# Patient Record
Sex: Female | Born: 1940 | Race: White | Hispanic: No | Marital: Single | Smoking: Never smoker
Health system: Southern US, Community
[De-identification: ages and names within clinical notes are randomized; demographics above are authoritative.]

## PROBLEM LIST (undated history)

## (undated) DIAGNOSIS — I10 Essential (primary) hypertension: Secondary | ICD-10-CM

## (undated) DIAGNOSIS — H9190 Unspecified hearing loss, unspecified ear: Secondary | ICD-10-CM

## (undated) DIAGNOSIS — J449 Chronic obstructive pulmonary disease, unspecified: Secondary | ICD-10-CM

## (undated) DIAGNOSIS — M810 Age-related osteoporosis without current pathological fracture: Secondary | ICD-10-CM

---

## 2018-11-14 ENCOUNTER — Other Ambulatory Visit: Payer: Self-pay | Admitting: Specialist

## 2018-11-14 DIAGNOSIS — J849 Interstitial pulmonary disease, unspecified: Secondary | ICD-10-CM

## 2018-11-21 ENCOUNTER — Ambulatory Visit
Admission: RE | Admit: 2018-11-21 | Discharge: 2018-11-21 | Disposition: A | Discharge status: Home / Self Care | Payer: Medicare Other | Source: Ambulatory Visit | Attending: Specialist | Admitting: Specialist

## 2018-11-21 ENCOUNTER — Other Ambulatory Visit: Payer: Self-pay

## 2018-11-21 DIAGNOSIS — J849 Interstitial pulmonary disease, unspecified: Secondary | ICD-10-CM | POA: Diagnosis present

## 2018-11-29 DIAGNOSIS — I10 Essential (primary) hypertension: Secondary | ICD-10-CM | POA: Insufficient documentation

## 2018-12-11 ENCOUNTER — Telehealth: Payer: Self-pay

## 2018-12-11 NOTE — Telephone Encounter (Signed)
Spoke with facility and pt is being seen by Dr. Raul Del, referral was sent to [place] in error. Nothing further needed.

## 2018-12-11 NOTE — Telephone Encounter (Signed)
Called facility x2 and left vm to schedule consult appt. Received faxed referral from [employer].

## 2019-01-17 ENCOUNTER — Emergency Department: Payer: Medicare Other

## 2019-01-17 ENCOUNTER — Other Ambulatory Visit: Payer: Self-pay

## 2019-01-17 ENCOUNTER — Encounter: Payer: Self-pay | Admitting: Emergency Medicine

## 2019-01-17 ENCOUNTER — Emergency Department
Admission: EM | Admit: 2019-01-17 | Discharge: 2019-01-17 | Disposition: A | Discharge status: Home / Self Care | Payer: Medicare Other | Attending: Student in an Organized Health Care Education/Training Program | Admitting: Student in an Organized Health Care Education/Training Program

## 2019-01-17 DIAGNOSIS — W19XXXA Unspecified fall, initial encounter: Secondary | ICD-10-CM | POA: Diagnosis not present

## 2019-01-17 DIAGNOSIS — Y92129 Unspecified place in nursing home as the place of occurrence of the external cause: Secondary | ICD-10-CM | POA: Diagnosis not present

## 2019-01-17 DIAGNOSIS — Y939 Activity, unspecified: Secondary | ICD-10-CM | POA: Diagnosis not present

## 2019-01-17 DIAGNOSIS — I10 Essential (primary) hypertension: Secondary | ICD-10-CM | POA: Insufficient documentation

## 2019-01-17 DIAGNOSIS — S0083XA Contusion of other part of head, initial encounter: Secondary | ICD-10-CM | POA: Insufficient documentation

## 2019-01-17 DIAGNOSIS — S0990XA Unspecified injury of head, initial encounter: Secondary | ICD-10-CM

## 2019-01-17 DIAGNOSIS — J449 Chronic obstructive pulmonary disease, unspecified: Secondary | ICD-10-CM | POA: Insufficient documentation

## 2019-01-17 DIAGNOSIS — Z79899 Other long term (current) drug therapy: Secondary | ICD-10-CM | POA: Diagnosis not present

## 2019-01-17 DIAGNOSIS — Y999 Unspecified external cause status: Secondary | ICD-10-CM | POA: Diagnosis not present

## 2019-01-17 HISTORY — DX: Essential (primary) hypertension: I10

## 2019-01-17 HISTORY — DX: Age-related osteoporosis without current pathological fracture: M81.0

## 2019-01-17 HISTORY — DX: Chronic obstructive pulmonary disease, unspecified: J44.9

## 2019-01-17 LAB — CBC
HCT: 32.3 % — ABNORMAL LOW (ref 36.0–46.0)
Hemoglobin: 10 g/dL — ABNORMAL LOW (ref 12.0–15.0)
MCH: 28.3 pg (ref 26.0–34.0)
MCHC: 31 g/dL (ref 30.0–36.0)
MCV: 91.5 fL (ref 80.0–100.0)
Platelets: 190 10*3/uL (ref 150–400)
RBC: 3.53 MIL/uL — ABNORMAL LOW (ref 3.87–5.11)
RDW: 14.6 % (ref 11.5–15.5)
WBC: 7 10*3/uL (ref 4.0–10.5)
nRBC: 0 % (ref 0.0–0.2)

## 2019-01-17 LAB — BASIC METABOLIC PANEL
Anion gap: 9 (ref 5–15)
BUN: 6 mg/dL — ABNORMAL LOW (ref 8–23)
CO2: 25 mmol/L (ref 22–32)
Calcium: 8.3 mg/dL — ABNORMAL LOW (ref 8.9–10.3)
Chloride: 104 mmol/L (ref 98–111)
Creatinine, Ser: 0.68 mg/dL (ref 0.44–1.00)
GFR calc Af Amer: >60 mL/min (ref >60)
GFR calc non Af Amer: >60 mL/min (ref >60)
Glucose, Bld: 110 mg/dL — ABNORMAL HIGH (ref 70–99)
Potassium: 3.7 mmol/L (ref 3.5–5.1)
Sodium: 138 mmol/L (ref 135–145)

## 2019-01-17 MED ORDER — SODIUM CHLORIDE 0.9% FLUSH
3.0000 mL | Freq: Once | INTRAVENOUS | Status: AC
  Administered 2019-01-17: 12:00:00 3 mL via INTRAVENOUS

## 2019-01-17 NOTE — ED Notes (Signed)
Per the [employer] pt does not know sign language bu communicates by reading lips. Pt does not have family to call for assistance. Pt has been able to read noted with this RN but will not write messages back.

## 2019-01-17 NOTE — ED Notes (Signed)
[facility] to this RN that they are willing to pick up patient. Pt agrees with discharge. NAD at this time.

## 2019-01-17 NOTE — ED Notes (Signed)
Patient transported to CT 

## 2019-01-17 NOTE — ED Notes (Addendum)
Pt ambulatory to bathroom with short distance for ambulation. Pt had notable increased WOB and attempting multiple times to clear throat. Oxygen was noted to be off patient. Oxygen reapplied and Pt sitting on edge of bed at this time to cath breath. When sitting on edge of bed with oxygen on pts oxygen saturation increased above 93%.

## 2019-01-17 NOTE — ED Provider Notes (Signed)
[facility] Emergency Department Provider Note    First MD Initiated Contact with Patient 01/17/19 1244     (approximate)  I have reviewed the triage vital signs and the nursing notes.   HISTORY  Chief Complaint Fall    HPI Alice Wallace is a 78 y.o. female blows a past medical history presents after witnessed fall with brief LOC.  Coming from nursing facility.  Wears 3 L nasal cannula chronically.  Denies any pain at this time.  States that she has chronic shortness of breath.  Denies any nausea or vomiting.  Does have mild contusion to right forehead and some neck pain.    Past Medical History:  Diagnosis Date  . COPD (chronic obstructive pulmonary disease) ([place])   . Hypertension   . Osteoporosis    No family history on file.  There are no active problems to display for this patient.     Prior to Admission medications   Medication Sig Start Date End Date Taking? Authorizing Provider  diclofenac sodium (VOLTAREN) 1 % GEL Apply 2 g topically 3 (three) times daily. 01/03/19  Yes [provider]  diltiazem (CARDIZEM CD) 240 MG 24 hr capsule Take 240 mg by mouth daily. 01/11/19  Yes [provider]  escitalopram (LEXAPRO) 10 MG tablet Take 10 mg by mouth at bedtime. 06/08/16  Yes [provider]  ferrous sulfate 325 (65 FE) MG tablet Take 325 mg by mouth daily.   Yes [provider]  furosemide (LASIX) 20 MG tablet Take 20 mg by mouth daily.   Yes [provider]  omeprazole (PRILOSEC) 40 MG capsule Take 40 mg by mouth daily. 01/11/19  Yes [provider]  potassium chloride (K-DUR) 10 MEQ tablet Take 10 mEq by mouth daily.   Yes [provider]  traMADol (ULTRAM) 50 MG tablet Take 50 mg by mouth every 8 (eight) hours. 01/02/19  Yes [provider]    Allergies Hydrocodone, Naproxen, Other, Sulfamethoxazole-trimethoprim, and Tramadol    Social History Social History   Tobacco  Use  . Smoking status: Not on file  Substance Use Topics  . Alcohol use: Not on file  . Drug use: Not on file    Review of Systems Patient denies headaches, rhinorrhea, blurry vision, numbness, shortness of breath, chest pain, edema, cough, abdominal pain, nausea, vomiting, diarrhea, dysuria, fevers, rashes or hallucinations unless otherwise stated above in HPI. ____________________________________________   PHYSICAL EXAM:  VITAL SIGNS: Vitals:   01/17/19 1322 01/17/19 1323  BP:    Pulse: 88 85  Resp: 20 (!) 22  Temp:    SpO2: 95% 96%    Constitutional: Alert and oriented. Non verbal bu will communicate yes or no after reading written messages Eyes: Conjunctivae are normal.  Head: Atraumatic. Nose: No congestion/rhinnorhea. Mouth/Throat: Mucous membranes are moist.   Neck: No stridor. Painless ROM.  Cardiovascular: Normal rate, regular rhythm. Grossly normal heart sounds.  Good peripheral circulation. Respiratory: Normal respiratory effort.  No retractions. Lungs CTAB. Gastrointestinal: Soft and nontender. No distention. No abdominal bruits. No CVA tenderness. Genitourinary:  Musculoskeletal: No lower extremity tenderness nor edema.  No joint effusions. Neurologic:  Normal speech and language. No gross focal neurologic deficits are appreciated. No facial droop Skin:  Skin is warm, dry and intact. No rash noted. Psychiatric: Mood and affect are normal. Speech and behavior are normal.  ____________________________________________   LABS (all labs ordered are listed, but only abnormal results are displayed)  Results for orders placed  or performed during the hospital encounter of 01/17/19 (from the past 24 hour(s))  Basic metabolic panel     Status: Abnormal   Collection Time: 01/17/19 11:36 AM  Result Value Ref Range   Sodium 138 135 - 145 mmol/L   Potassium 3.7 3.5 - 5.1 mmol/L   Chloride 104 98 - 111 mmol/L   CO2 25 22 - 32 mmol/L   Glucose, Bld 110 (H) 70 - 99  mg/dL   BUN 6 (L) 8 - 23 mg/dL   Creatinine, Ser 1.610.68 0.44 - 1.00 mg/dL   Calcium 8.3 (L) 8.9 - 10.3 mg/dL   GFR calc non Af Amer >60 >60 mL/min   GFR calc Af Amer >60 >60 mL/min   Anion gap 9 5 - 15  CBC     Status: Abnormal   Collection Time: 01/17/19 11:36 AM  Result Value Ref Range   WBC 7.0 4.0 - 10.5 K/uL   RBC 3.53 (L) 3.87 - 5.11 MIL/uL   Hemoglobin 10.0 (L) 12.0 - 15.0 g/dL   HCT 09.632.3 (L) 04.536.0 - 40.946.0 %   MCV 91.5 80.0 - 100.0 fL   MCH 28.3 26.0 - 34.0 pg   MCHC 31.0 30.0 - 36.0 g/dL   RDW 81.114.6 91.411.5 - 78.215.5 %   Platelets 190 150 - 400 K/uL   nRBC 0.0 0.0 - 0.2 %   ____________________________________________  EKG My review and personal interpretation at Time: 11:31   Indication: fall  Rate: 90  Rhythm: sinus Axis: normal Other: normal intervals, no stemi ____________________________________________  RADIOLOGY  I personally reviewed all radiographic images ordered to evaluate for the above acute complaints and reviewed radiology reports and findings.  These findings were personally discussed with the patient.  Please see medical record for radiology report.   ____________________________________________   PROCEDURES  Procedure(s) performed:  Procedures    Critical Care performed: no ____________________________________________   INITIAL IMPRESSION / ASSESSMENT AND PLAN / ED COURSE  Pertinent labs & imaging results that were available during my care of the patient were reviewed by me and considered in my medical decision making (see chart for details).   DDX: Contusion, subdural, epidural, IPH, concussion, fracture, COPD, pneumonia  Alice Wallace is a 78 y.o. who presents to the ED with symptoms as described above.  Mechanical fall with minor head injury and contusion to right forehead without laceration.  CT imaging and radiographs are reassuring.  Blood work is reassuring.  She denies any additional pain.  No evidence of laceration.  She feels at her  baseline and comfortable discharge back to facility.       The patient was evaluated in Emergency Department today for the symptoms described in the history of present illness. He/she was evaluated in the context of the global COVID-19 pandemic, which necessitated consideration that the patient might be at risk for infection with the SARS-CoV-2 virus that causes COVID-19. Institutional protocols and algorithms that pertain to the evaluation of patients at risk for COVID-19 are in a state of rapid change based on information released by regulatory bodies including the CDC and federal and state organizations. These policies and algorithms were followed during the patient's care in the ED.  As part of my medical decision making, I reviewed the following data within the electronic MEDICAL RECORD NUMBER Nursing notes reviewed and incorporated, Labs reviewed, notes from prior ED visits and [place] Controlled Substance Database   ____________________________________________   FINAL CLINICAL IMPRESSION(S) / ED DIAGNOSES  Final diagnoses:  Fall,  initial encounter  Minor head injury, initial encounter      NEW MEDICATIONS STARTED DURING THIS VISIT:  New Prescriptions   No medications on file     Note:  This document was prepared using Dragon voice recognition software and may include unintentional dictation errors.    Willy Eddy, MD 01/17/19 1343

## 2019-01-17 NOTE — ED Notes (Addendum)
Patient transported to CT 

## 2019-01-17 NOTE — ED Triage Notes (Signed)
Pt to ED via [facility] from [place] for fall. Pt tripped on her bedroom shoes and fell. Pt reports that she did have LOC after the fall. Pt does not take blood thinners. Pt is deaf and mute. Pt is on home O2 at 3 liters via [place]. Pt is in NAD at this time. Pt has hematoma on right forehead, and right arm. Pt is having right shoulder pain and head pain. VSS per EMS. CBG 89.

## 2019-05-26 ENCOUNTER — Emergency Department: Payer: Medicare Other

## 2019-05-26 ENCOUNTER — Encounter: Payer: Self-pay | Admitting: *Deleted

## 2019-05-26 ENCOUNTER — Inpatient Hospital Stay
Admission: EM | Admit: 2019-05-26 | Discharge: 2019-05-27 | DRG: 871 | Disposition: A | Discharge status: Other Acute Inpatient Hospital | Payer: Medicare Other | Source: Skilled Nursing Facility | Attending: Internal Medicine | Admitting: Internal Medicine

## 2019-05-26 ENCOUNTER — Other Ambulatory Visit: Payer: Self-pay

## 2019-05-26 DIAGNOSIS — H913 Deaf nonspeaking, not elsewhere classified: Secondary | ICD-10-CM | POA: Diagnosis present

## 2019-05-26 DIAGNOSIS — E860 Dehydration: Secondary | ICD-10-CM | POA: Diagnosis present

## 2019-05-26 DIAGNOSIS — U071 COVID-19: Secondary | ICD-10-CM

## 2019-05-26 DIAGNOSIS — R0603 Acute respiratory distress: Secondary | ICD-10-CM

## 2019-05-26 DIAGNOSIS — E876 Hypokalemia: Secondary | ICD-10-CM | POA: Diagnosis present

## 2019-05-26 DIAGNOSIS — E878 Other disorders of electrolyte and fluid balance, not elsewhere classified: Secondary | ICD-10-CM | POA: Diagnosis present

## 2019-05-26 DIAGNOSIS — Z885 Allergy status to narcotic agent status: Secondary | ICD-10-CM

## 2019-05-26 DIAGNOSIS — Z7982 Long term (current) use of aspirin: Secondary | ICD-10-CM

## 2019-05-26 DIAGNOSIS — Z7983 Long term (current) use of bisphosphonates: Secondary | ICD-10-CM

## 2019-05-26 DIAGNOSIS — I10 Essential (primary) hypertension: Secondary | ICD-10-CM | POA: Diagnosis present

## 2019-05-26 DIAGNOSIS — J9601 Acute respiratory failure with hypoxia: Secondary | ICD-10-CM | POA: Diagnosis not present

## 2019-05-26 DIAGNOSIS — N390 Urinary tract infection, site not specified: Secondary | ICD-10-CM

## 2019-05-26 DIAGNOSIS — N179 Acute kidney failure, unspecified: Secondary | ICD-10-CM | POA: Diagnosis present

## 2019-05-26 DIAGNOSIS — A4189 Other specified sepsis: Principal | ICD-10-CM | POA: Diagnosis present

## 2019-05-26 DIAGNOSIS — R195 Other fecal abnormalities: Secondary | ICD-10-CM | POA: Diagnosis present

## 2019-05-26 DIAGNOSIS — Z888 Allergy status to other drugs, medicaments and biological substances status: Secondary | ICD-10-CM

## 2019-05-26 DIAGNOSIS — Z79899 Other long term (current) drug therapy: Secondary | ICD-10-CM

## 2019-05-26 DIAGNOSIS — I444 Left anterior fascicular block: Secondary | ICD-10-CM | POA: Diagnosis present

## 2019-05-26 DIAGNOSIS — D649 Anemia, unspecified: Secondary | ICD-10-CM | POA: Diagnosis present

## 2019-05-26 DIAGNOSIS — J440 Chronic obstructive pulmonary disease with acute lower respiratory infection: Secondary | ICD-10-CM | POA: Diagnosis present

## 2019-05-26 DIAGNOSIS — R0602 Shortness of breath: Secondary | ICD-10-CM

## 2019-05-26 DIAGNOSIS — J1282 Pneumonia due to coronavirus disease 2019: Secondary | ICD-10-CM | POA: Diagnosis present

## 2019-05-26 DIAGNOSIS — R0902 Hypoxemia: Secondary | ICD-10-CM

## 2019-05-26 DIAGNOSIS — E871 Hypo-osmolality and hyponatremia: Secondary | ICD-10-CM | POA: Diagnosis present

## 2019-05-26 DIAGNOSIS — M810 Age-related osteoporosis without current pathological fracture: Secondary | ICD-10-CM | POA: Diagnosis present

## 2019-05-26 HISTORY — DX: Unspecified hearing loss, unspecified ear: H91.90

## 2019-05-26 MED ORDER — DEXAMETHASONE SODIUM PHOSPHATE 10 MG/ML IJ SOLN
6.0000 mg | INTRAMUSCULAR | Status: DC
  Administered 2019-05-27: 01:00:00 6 mg via INTRAVENOUS
  Filled 2019-05-26 (×2): qty 1

## 2019-05-26 MED ORDER — SODIUM CHLORIDE 0.9% FLUSH
3.0000 mL | Freq: Two times a day (BID) | INTRAVENOUS | Status: DC
  Administered 2019-05-27: 02:00:00 3 mL via INTRAVENOUS

## 2019-05-26 MED ORDER — SODIUM CHLORIDE 0.9 % IV SOLN
200.0000 mg | Freq: Once | INTRAVENOUS | Status: AC
  Administered 2019-05-27: 01:00:00 200 mg via INTRAVENOUS
  Filled 2019-05-26: qty 200

## 2019-05-26 MED ORDER — SODIUM CHLORIDE 0.9 % IV BOLUS
1000.0000 mL | Freq: Once | INTRAVENOUS | Status: AC
  Administered 2019-05-27: 01:00:00 1000 mL via INTRAVENOUS

## 2019-05-26 MED ORDER — SODIUM CHLORIDE 0.9 % IV SOLN: 100.0000 mg | Freq: Every day | INTRAVENOUS | Status: DC

## 2019-05-26 NOTE — ED Provider Notes (Signed)
[facility] Emergency Department Provider Note   ____________________________________________   First MD Initiated Contact with Patient 05/26/19 2340     (approximate)  I have reviewed the triage vital signs and the nursing notes.   HISTORY  Chief Complaint Shortness of Breath  Level V caveat: Limited by respiratory distress Patient is deaf and does not use ASL.  Majority of history obtained from EMS via nursing home staff.  HPI Alice Wallace is a 79 y.o. female brought to the ED via EMS from the [place] with a chief complaint of respiratory distress.  Staff told EMS patient tested positive for COVID-19 on 12/29.  Patient has COPD and wears 3 L continuous oxygenation at baseline.  EMS reported sats on her usual 3 L in the low 80%'s.  Arrives on nonrebreather.  Denies abdominal pain, nausea, vomiting or diarrhea.       Past Medical History:  Diagnosis Date  . COPD (chronic obstructive pulmonary disease) ([place])   . Hypertension   . Osteoporosis     Patient Active Problem List   Diagnosis Date Noted  . Acute hypoxemic respiratory failure due to COVID-19 [facility]) 05/27/2019     Prior to Admission medications   Medication Sig Start Date End Date Taking? Authorizing Provider  acetaminophen (TYLENOL) 500 MG tablet Take 500 mg by mouth 3 (three) times daily.    [provider]  alendronate (FOSAMAX) 70 MG tablet Take 70 mg by mouth once a week. Take with a full glass of water on an empty stomach.    [provider]  aspirin EC 81 MG tablet Take 81 mg by mouth daily.    [provider]  cetirizine (ZYRTEC) 10 MG tablet Take 10 mg by mouth daily.    [provider]  Cholecalciferol (VITAMIN D) 125 MCG (5000 UT) CAPS Take 5,000 Units by mouth daily.    [provider]  diclofenac sodium (VOLTAREN) 1 % GEL Apply 2 g topically 3 (three) times daily. 01/03/19   [provider]  diltiazem (CARDIZEM CD) 240 MG 24 hr  capsule Take 240 mg by mouth daily. 01/11/19   [provider]  escitalopram (LEXAPRO) 10 MG tablet Take 10 mg by mouth at bedtime. 06/08/16   [provider]  ferrous sulfate 325 (65 FE) MG tablet Take 325 mg by mouth daily.    [provider]  Fluticasone-Salmeterol (ADVAIR) 100-50 MCG/DOSE AEPB Inhale 1 puff into the lungs 2 (two) times daily.    [provider]  furosemide (LASIX) 20 MG tablet Take 20 mg by mouth daily.    [provider]  omeprazole (PRILOSEC) 40 MG capsule Take 40 mg by mouth daily. 01/11/19   [provider]  potassium chloride (K-DUR) 10 MEQ tablet Take 10 mEq by mouth daily.    [provider]  traMADol (ULTRAM) 50 MG tablet Take 50 mg by mouth every 8 (eight) hours. 01/02/19   [provider]    Allergies Hydrocodone, Naproxen, Other, Sulfamethoxazole-trimethoprim, and Tramadol  No family history on file.  Social History Social History   Tobacco Use  . Smoking status: Never Smoker  . Smokeless tobacco: Never Used  Substance Use Topics  . Alcohol use: Not Currently  . Drug use: Not Currently    Review of Systems  Constitutional: No fever/chills Eyes: No visual changes. ENT: No sore throat. Cardiovascular: Denies chest pain. Respiratory: Positive for shortness of breath. Gastrointestinal: No abdominal pain.  No nausea, no vomiting.  No  diarrhea.  No constipation. Genitourinary: Negative for dysuria. Musculoskeletal: Negative for back pain. Skin: Negative for rash. Neurological: Negative for headaches, focal weakness or numbness.   ____________________________________________   PHYSICAL EXAM:  VITAL SIGNS: ED Triage Vitals  Enc Vitals Group     BP      Pulse      Resp      Temp      Temp src      SpO2      Weight      Height      Head Circumference      Peak Flow      Pain Score      Pain Loc      Pain Edu?      Excl. in GC?     Constitutional: Alert and  oriented.  Elderly appearing and in moderate acute distress. Eyes: Conjunctivae are normal. PERRL. EOMI. Head: Atraumatic. Nose: No congestion/rhinnorhea. Mouth/Throat: Mucous membranes are moist.  Oropharynx non-erythematous. Neck: No stridor.   Cardiovascular: Normal rate, regular rhythm. Grossly normal heart sounds.  Good peripheral circulation. Respiratory: Increased respiratory effort.  No retractions. Lungs diminished bibasilarly. Gastrointestinal: Soft and nontender. No distention. No abdominal bruits. No CVA tenderness. Musculoskeletal: No lower extremity tenderness nor edema.  No joint effusions. Neurologic: Alert and oriented x3. No gross focal neurologic deficits are appreciated.  Skin:  Skin is warm, dry and intact. No rash noted. Psychiatric: Mood and affect are normal. Speech and behavior are normal.  ____________________________________________   LABS (all labs ordered are listed, but only abnormal results are displayed)  Labs Reviewed  CBC WITH DIFFERENTIAL/PLATELET - Abnormal; Notable for the following components:      Result Value   RBC 3.52 (*)    Hemoglobin 10.4 (*)    HCT 31.0 (*)    Neutro Abs 9.5 (*)    Lymphs Abs 0.4 (*)    All other components within normal limits  COMPREHENSIVE METABOLIC PANEL - Abnormal; Notable for the following components:   Sodium 133 (*)    Potassium 3.2 (*)    Chloride 89 (*)    Glucose, Bld 125 (*)    Creatinine, Ser 1.25 (*)    GFR calc non Af Amer 41 (*)    GFR calc Af Amer 48 (*)    All other components within normal limits  FERRITIN - Abnormal; Notable for the following components:   Ferritin 833 (*)    All other components within normal limits  URINALYSIS, COMPLETE (UACMP) WITH MICROSCOPIC - Abnormal; Notable for the following components:   Color, Urine YELLOW (*)    APPearance CLOUDY (*)    Ketones, ur 5 (*)    Leukocytes,Ua LARGE (*)    WBC, UA >50 (*)    Bacteria, UA RARE (*)    All other components within normal  limits  POC SARS CORONAVIRUS 2 AG - Abnormal; Notable for the following components:   SARS Coronavirus 2 Ag POSITIVE (*)    All other components within normal limits  TROPONIN I (HIGH SENSITIVITY) - Abnormal; Notable for the following components:   Troponin I (High Sensitivity) 19 (*)    All other components within normal limits  TROPONIN I (HIGH SENSITIVITY) - Abnormal; Notable for the following components:   Troponin I (High Sensitivity) 20 (*)    All other components within normal limits  CULTURE, BLOOD (ROUTINE X 2)  CULTURE, BLOOD (ROUTINE X 2)  URINE CULTURE  EXPECTORATED SPUTUM ASSESSMENT W REFEX TO RESP CULTURE  FIBRINOGEN  LACTATE DEHYDROGENASE  PROCALCITONIN  LACTIC ACID, PLASMA  LACTIC ACID, PLASMA  C-REACTIVE PROTEIN  LEGIONELLA PNEUMOPHILA SEROGP 1 UR AG  STREP PNEUMONIAE URINARY ANTIGEN  INFLUENZA PANEL BY PCR (TYPE A & B)  D-DIMER, QUANTITATIVE (NOT AT [facility])  POC SARS CORONAVIRUS 2 AG -  ED  ABO/RH  ABO/RH   ____________________________________________  EKG  ED ECG REPORT I, Romon Devereux J, the attending physician, personally viewed and interpreted this ECG.   Date: 05/26/2019  EKG Time: 0015  Rate: 91  Rhythm: normal EKG, normal sinus rhythm  Axis: LAD  Intervals:left anterior fascicular block  ST&T Change: Nonspecific  ____________________________________________  RADIOLOGY  ED MD interpretation: COPD  Official radiology report(s): DG Chest Port 1 View  Result Date: 05/27/2019 CLINICAL DATA:  79 year old female with respiratory distress. EXAM: PORTABLE CHEST 1 VIEW COMPARISON:  Chest radiograph dated 01/17/2019 and CT dated 11/21/2018. FINDINGS: Emphysema. Bilateral mid to lower lung field interstitial coarsening as well as hazy airspace density primarily involving the left lung base, likely chronic. Developing infiltrate is less likely. Clinical correlation is recommended. No pleural effusion or pneumothorax. Right upper lobe calcified nodules again  noted. Stable cardiac silhouette. Atherosclerotic calcification of the aorta. No acute osseous pathology. IMPRESSION: 1. Left lower lung field density, likely chronic. Developing infiltrate is less likely but not excluded. Clinical correlation is recommended. 2. Advanced emphysema. Electronically Signed   By: Elgie Collard M.D.   On: 05/27/2019 00:41    ____________________________________________   PROCEDURES  Procedure(s) performed (including Critical Care):  Procedures  CRITICAL CARE Performed by: Irean Hong   Total critical care time: 45 minutes  Critical care time was exclusive of separately billable procedures and treating other patients.  Critical care was necessary to treat or prevent imminent or life-threatening deterioration.  Critical care was time spent personally by me on the following activities: development of treatment plan with patient and/or surrogate as well as nursing, discussions with consultants, evaluation of patient's response to treatment, examination of patient, obtaining history from patient or surrogate, ordering and performing treatments and interventions, ordering and review of laboratory studies, ordering and review of radiographic studies, pulse oximetry and re-evaluation of patient's condition.  ____________________________________________   INITIAL IMPRESSION / ASSESSMENT AND PLAN / ED COURSE  As part of my medical decision making, I reviewed the following data within the electronic MEDICAL RECORD NUMBER Nursing notes reviewed and incorporated, Interpreter needed, Labs reviewed, EKG interpreted, Old chart reviewed, Radiograph reviewed, Discussed with admitting physician and Notes from prior ED visits     Alice Wallace was evaluated in Emergency Department on 05/27/2019 for the symptoms described in the history of present illness. She was evaluated in the context of the global COVID-19 pandemic, which necessitated consideration that the patient might be  at risk for infection with the SARS-CoV-2 virus that causes COVID-19. Institutional protocols and algorithms that pertain to the evaluation of patients at risk for COVID-19 are in a state of rapid change based on information released by regulatory bodies including the CDC and federal and state organizations. These policies and algorithms were followed during the patient's care in the ED.    79 year old female with COPD on 3 L baseline oxygen who is Covid positive per facility report presenting with respiratory distress. Differential includes, but is not limited to, viral syndrome, bronchitis including COPD exacerbation, pneumonia, reactive airway disease including asthma, CHF including exacerbation with or without pulmonary/interstitial edema, pneumothorax, ACS, thoracic trauma, and pulmonary embolism.  Obtain Covid/sepsis work-up.  Rapid Covid  antigen ordered as there is no documented positive Covid result in the computer due to patient being tested at the nursing facility.  IV fluids for blood pressure.  Start IV Decadron and remdesivir.  Placed on high flow nasal cannula oxygen.  Anticipate hospitalization.   Clinical Course as of May 26 318  Tue May 27, 2019  0057 Sats 98% on high flow nasal cannula oxygen.  Will administer IV antibiotics for UTI.  Will discuss with hospitalist services to evaluate patient in the emergency department for admission.   [JS]    Clinical Course User Index [JS] Irean Hong, MD     ____________________________________________   FINAL CLINICAL IMPRESSION(S) / ED DIAGNOSES  Final diagnoses:  COVID-19  Shortness of breath  Hypoxia  Respiratory distress  Urinary tract infection without hematuria, site unspecified     ED Discharge Orders    None       Note:  This document was prepared using Dragon voice recognition software and may include unintentional dictation errors.   Irean Hong, MD 05/27/19 [id number]

## 2019-05-26 NOTE — ED Triage Notes (Signed)
Pt brought in via ems from the [facility] with sob   Pt is covid positive.  Pt is deaf.  oxygens sats  77% rooom air, 92 % on 6 liters [place].  Pt alert  nsr on monitor

## 2019-05-26 NOTE — ED Notes (Signed)
Pt brought in for sob.  Pt is deaf.  Pt on 6 liters oxygen [place].  Pt alert.  md at bedside.

## 2019-05-27 ENCOUNTER — Emergency Department: Payer: Medicare Other

## 2019-05-27 ENCOUNTER — Encounter: Payer: Self-pay | Admitting: Family Medicine

## 2019-05-27 ENCOUNTER — Inpatient Hospital Stay (HOSPITAL_COMMUNITY)
Admission: AD | Admit: 2019-05-27 | Discharge: 2019-05-30 | DRG: 177 | Disposition: A | Discharge status: Skilled Nursing Facility | Payer: Medicare Other | Source: Other Acute Inpatient Hospital | Attending: Internal Medicine | Admitting: Internal Medicine

## 2019-05-27 DIAGNOSIS — R778 Other specified abnormalities of plasma proteins: Secondary | ICD-10-CM | POA: Diagnosis present

## 2019-05-27 DIAGNOSIS — L89220 Pressure ulcer of left hip, unstageable: Secondary | ICD-10-CM | POA: Diagnosis not present

## 2019-05-27 DIAGNOSIS — R0902 Hypoxemia: Secondary | ICD-10-CM | POA: Diagnosis not present

## 2019-05-27 DIAGNOSIS — K921 Melena: Secondary | ICD-10-CM | POA: Diagnosis present

## 2019-05-27 DIAGNOSIS — Z885 Allergy status to narcotic agent status: Secondary | ICD-10-CM | POA: Diagnosis not present

## 2019-05-27 DIAGNOSIS — J159 Unspecified bacterial pneumonia: Secondary | ICD-10-CM | POA: Diagnosis present

## 2019-05-27 DIAGNOSIS — H913 Deaf nonspeaking, not elsewhere classified: Secondary | ICD-10-CM | POA: Diagnosis present

## 2019-05-27 DIAGNOSIS — Z7983 Long term (current) use of bisphosphonates: Secondary | ICD-10-CM | POA: Diagnosis not present

## 2019-05-27 DIAGNOSIS — E860 Dehydration: Secondary | ICD-10-CM | POA: Diagnosis present

## 2019-05-27 DIAGNOSIS — J1282 Pneumonia due to coronavirus disease 2019: Secondary | ICD-10-CM | POA: Diagnosis present

## 2019-05-27 DIAGNOSIS — Z7982 Long term (current) use of aspirin: Secondary | ICD-10-CM

## 2019-05-27 DIAGNOSIS — J439 Emphysema, unspecified: Secondary | ICD-10-CM | POA: Diagnosis not present

## 2019-05-27 DIAGNOSIS — E871 Hypo-osmolality and hyponatremia: Secondary | ICD-10-CM | POA: Diagnosis present

## 2019-05-27 DIAGNOSIS — M810 Age-related osteoporosis without current pathological fracture: Secondary | ICD-10-CM | POA: Diagnosis present

## 2019-05-27 DIAGNOSIS — B9620 Unspecified Escherichia coli [E. coli] as the cause of diseases classified elsewhere: Secondary | ICD-10-CM | POA: Diagnosis not present

## 2019-05-27 DIAGNOSIS — J9601 Acute respiratory failure with hypoxia: Secondary | ICD-10-CM | POA: Diagnosis present

## 2019-05-27 DIAGNOSIS — E876 Hypokalemia: Secondary | ICD-10-CM | POA: Diagnosis present

## 2019-05-27 DIAGNOSIS — Z888 Allergy status to other drugs, medicaments and biological substances status: Secondary | ICD-10-CM | POA: Diagnosis not present

## 2019-05-27 DIAGNOSIS — N390 Urinary tract infection, site not specified: Secondary | ICD-10-CM

## 2019-05-27 DIAGNOSIS — U071 COVID-19: Principal | ICD-10-CM | POA: Diagnosis present

## 2019-05-27 DIAGNOSIS — A4189 Other specified sepsis: Secondary | ICD-10-CM | POA: Diagnosis not present

## 2019-05-27 DIAGNOSIS — I10 Essential (primary) hypertension: Secondary | ICD-10-CM | POA: Diagnosis present

## 2019-05-27 DIAGNOSIS — N179 Acute kidney failure, unspecified: Secondary | ICD-10-CM | POA: Diagnosis present

## 2019-05-27 DIAGNOSIS — Z79899 Other long term (current) drug therapy: Secondary | ICD-10-CM | POA: Diagnosis not present

## 2019-05-27 DIAGNOSIS — Z7951 Long term (current) use of inhaled steroids: Secondary | ICD-10-CM

## 2019-05-27 DIAGNOSIS — E878 Other disorders of electrolyte and fluid balance, not elsewhere classified: Secondary | ICD-10-CM | POA: Diagnosis present

## 2019-05-27 DIAGNOSIS — J9621 Acute and chronic respiratory failure with hypoxia: Secondary | ICD-10-CM | POA: Diagnosis present

## 2019-05-27 DIAGNOSIS — D649 Anemia, unspecified: Secondary | ICD-10-CM | POA: Diagnosis present

## 2019-05-27 DIAGNOSIS — I444 Left anterior fascicular block: Secondary | ICD-10-CM | POA: Diagnosis present

## 2019-05-27 DIAGNOSIS — J440 Chronic obstructive pulmonary disease with acute lower respiratory infection: Secondary | ICD-10-CM | POA: Diagnosis present

## 2019-05-27 DIAGNOSIS — R195 Other fecal abnormalities: Secondary | ICD-10-CM | POA: Diagnosis present

## 2019-05-27 LAB — CBC WITH DIFFERENTIAL/PLATELET
Abs Immature Granulocytes: 0.04 10*3/uL (ref 0.00–0.07)
Basophils Absolute: 0 10*3/uL (ref 0.0–0.1)
Basophils Relative: 0 %
Eosinophils Absolute: 0 10*3/uL (ref 0.0–0.5)
Eosinophils Relative: 0 %
HCT: 31 % — ABNORMAL LOW (ref 36.0–46.0)
Hemoglobin: 10.4 g/dL — ABNORMAL LOW (ref 12.0–15.0)
Immature Granulocytes: 0 %
Lymphocytes Relative: 4 %
Lymphs Abs: 0.4 10*3/uL — ABNORMAL LOW (ref 0.7–4.0)
MCH: 29.5 pg (ref 26.0–34.0)
MCHC: 33.5 g/dL (ref 30.0–36.0)
MCV: 88.1 fL (ref 80.0–100.0)
Monocytes Absolute: 0.5 10*3/uL (ref 0.1–1.0)
Monocytes Relative: 5 %
Neutro Abs: 9.5 10*3/uL — ABNORMAL HIGH (ref 1.7–7.7)
Neutrophils Relative %: 91 %
Platelets: 190 10*3/uL (ref 150–400)
RBC: 3.52 MIL/uL — ABNORMAL LOW (ref 3.87–5.11)
RDW: 14.4 % (ref 11.5–15.5)
WBC: 10.3 10*3/uL (ref 4.0–10.5)
nRBC: 0 % (ref 0.0–0.2)

## 2019-05-27 LAB — URINALYSIS, COMPLETE (UACMP) WITH MICROSCOPIC
Bilirubin Urine: NEGATIVE
Glucose, UA: NEGATIVE mg/dL
Hgb urine dipstick: NEGATIVE
Ketones, ur: 5 mg/dL — AB
Nitrite: NEGATIVE
Protein, ur: NEGATIVE mg/dL
Specific Gravity, Urine: 1.011 (ref 1.005–1.030)
Squamous Epithelial / HPF: NONE SEEN (ref 0–5)
WBC, UA: >50 WBC/hpf — ABNORMAL HIGH (ref 0–5)
pH: 5 (ref 5.0–8.0)

## 2019-05-27 LAB — COMPREHENSIVE METABOLIC PANEL
ALT: 15 U/L (ref 0–44)
AST: 27 U/L (ref 15–41)
Albumin: 4 g/dL (ref 3.5–5.0)
Alkaline Phosphatase: 54 U/L (ref 38–126)
Anion gap: 14 (ref 5–15)
BUN: 22 mg/dL (ref 8–23)
CO2: 30 mmol/L (ref 22–32)
Calcium: 9 mg/dL (ref 8.9–10.3)
Chloride: 89 mmol/L — ABNORMAL LOW (ref 98–111)
Creatinine, Ser: 1.25 mg/dL — ABNORMAL HIGH (ref 0.44–1.00)
GFR calc Af Amer: 48 mL/min — ABNORMAL LOW (ref >60)
GFR calc non Af Amer: 41 mL/min — ABNORMAL LOW (ref >60)
Glucose, Bld: 125 mg/dL — ABNORMAL HIGH (ref 70–99)
Potassium: 3.2 mmol/L — ABNORMAL LOW (ref 3.5–5.1)
Sodium: 133 mmol/L — ABNORMAL LOW (ref 135–145)
Total Bilirubin: 0.8 mg/dL (ref 0.3–1.2)
Total Protein: 7 g/dL (ref 6.5–8.1)

## 2019-05-27 LAB — TROPONIN I (HIGH SENSITIVITY)
Troponin I (High Sensitivity): 19 ng/L — ABNORMAL HIGH (ref <18)
Troponin I (High Sensitivity): 20 ng/L — ABNORMAL HIGH (ref <18)

## 2019-05-27 LAB — ABO/RH: ABO/RH(D): A POS

## 2019-05-27 LAB — LACTIC ACID, PLASMA
Lactic Acid, Venous: 1.5 mmol/L (ref 0.5–1.9)
Lactic Acid, Venous: 1.8 mmol/L (ref 0.5–1.9)

## 2019-05-27 LAB — TYPE AND SCREEN
ABO/RH(D): A POS
Antibody Screen: NEGATIVE

## 2019-05-27 LAB — FERRITIN: Ferritin: 833 ng/mL — ABNORMAL HIGH (ref 11–307)

## 2019-05-27 LAB — HEMOGLOBIN: Hemoglobin: 10.1 g/dL — ABNORMAL LOW (ref 12.0–15.0)

## 2019-05-27 LAB — D-DIMER, QUANTITATIVE: D-Dimer, Quant: 0.69 ug{FEU}/mL — ABNORMAL HIGH (ref 0.00–0.50)

## 2019-05-27 LAB — POC SARS CORONAVIRUS 2 AG: SARS Coronavirus 2 Ag: POSITIVE — AB

## 2019-05-27 LAB — C-REACTIVE PROTEIN: CRP: 10 mg/dL — ABNORMAL HIGH (ref <1.0)

## 2019-05-27 LAB — PROCALCITONIN: Procalcitonin: 4.34 ng/mL

## 2019-05-27 LAB — INFLUENZA PANEL BY PCR (TYPE A & B)
Influenza A By PCR: NEGATIVE
Influenza B By PCR: NEGATIVE

## 2019-05-27 LAB — LACTATE DEHYDROGENASE: LDH: 177 U/L (ref 98–192)

## 2019-05-27 LAB — FIBRINOGEN: Fibrinogen: 465 mg/dL (ref 210–475)

## 2019-05-27 MED ORDER — LORATADINE 10 MG PO TABS
10.0000 mg | ORAL_TABLET | Freq: Every day | ORAL | Status: DC
  Administered 2019-05-28 – 2019-05-30 (×3): 10 mg via ORAL
  Filled 2019-05-27 (×3): qty 1

## 2019-05-27 MED ORDER — LORATADINE 10 MG PO TABS
10.0000 mg | ORAL_TABLET | Freq: Every day | ORAL | Status: DC
  Administered 2019-05-27: 09:00:00 10 mg via ORAL
  Filled 2019-05-27: qty 1

## 2019-05-27 MED ORDER — ASPIRIN EC 81 MG PO TBEC
81.0000 mg | DELAYED_RELEASE_TABLET | Freq: Every day | ORAL | Status: DC
  Administered 2019-05-28 – 2019-05-30 (×3): 81 mg via ORAL
  Filled 2019-05-27 (×3): qty 1

## 2019-05-27 MED ORDER — METHYLPREDNISOLONE SODIUM SUCC 40 MG IJ SOLR
0.5000 mg/kg | Freq: Two times a day (BID) | INTRAMUSCULAR | Status: DC
  Administered 2019-05-27 – 2019-05-29 (×4): 29.6 mg via INTRAVENOUS
  Filled 2019-05-27 (×4): qty 1

## 2019-05-27 MED ORDER — TRAMADOL HCL 50 MG PO TABS
50.0000 mg | ORAL_TABLET | Freq: Three times a day (TID) | ORAL | Status: DC
  Administered 2019-05-27 (×2): 50 mg via ORAL
  Filled 2019-05-27 (×2): qty 1

## 2019-05-27 MED ORDER — SODIUM CHLORIDE 0.9 % IV SOLN
500.0000 mg | INTRAVENOUS | Status: DC
  Administered 2019-05-27: 500 mg via INTRAVENOUS
  Filled 2019-05-27: qty 500

## 2019-05-27 MED ORDER — SODIUM CHLORIDE 0.9 % IV SOLN
500.0000 mg | INTRAVENOUS | Status: DC
  Administered 2019-05-27 – 2019-05-29 (×3): 500 mg via INTRAVENOUS
  Filled 2019-05-27 (×3): qty 500

## 2019-05-27 MED ORDER — ENOXAPARIN SODIUM 40 MG/0.4ML ~~LOC~~ SOLN: 40.0000 mg | SUBCUTANEOUS | Status: DC

## 2019-05-27 MED ORDER — MAGNESIUM HYDROXIDE 400 MG/5ML PO SUSP: 30.0000 mL | Freq: Every day | ORAL | Status: DC | PRN

## 2019-05-27 MED ORDER — VITAMIN D 25 MCG (1000 UNIT) PO TABS
1000.0000 [IU] | ORAL_TABLET | Freq: Every day | ORAL | Status: DC
  Administered 2019-05-27: 09:00:00 1000 [IU] via ORAL
  Filled 2019-05-27: qty 1

## 2019-05-27 MED ORDER — SODIUM CHLORIDE 0.9 % IV SOLN: 100.0000 mg | Freq: Every day | INTRAVENOUS | Status: DC

## 2019-05-27 MED ORDER — POLYETHYLENE GLYCOL 3350 17 G PO PACK: 17.0000 g | PACK | Freq: Every day | ORAL | Status: DC | PRN

## 2019-05-27 MED ORDER — PANTOPRAZOLE SODIUM 40 MG IV SOLR
40.0000 mg | Freq: Two times a day (BID) | INTRAVENOUS | Status: DC
  Administered 2019-05-27: 12:00:00 40 mg via INTRAVENOUS
  Filled 2019-05-27: qty 40

## 2019-05-27 MED ORDER — ACETAMINOPHEN 500 MG PO TABS
500.0000 mg | ORAL_TABLET | Freq: Three times a day (TID) | ORAL | Status: DC
  Administered 2019-05-27 – 2019-05-30 (×10): 500 mg via ORAL
  Filled 2019-05-27 (×10): qty 1

## 2019-05-27 MED ORDER — ALBUTEROL SULFATE HFA 108 (90 BASE) MCG/ACT IN AERS
2.0000 | INHALATION_SPRAY | RESPIRATORY_TRACT | Status: DC | PRN
  Filled 2019-05-27: qty 6.7

## 2019-05-27 MED ORDER — POTASSIUM CHLORIDE CRYS ER 10 MEQ PO TBCR
10.0000 meq | EXTENDED_RELEASE_TABLET | Freq: Every day | ORAL | Status: DC
  Administered 2019-05-27: 09:00:00 10 meq via ORAL
  Filled 2019-05-27: qty 1

## 2019-05-27 MED ORDER — VITAMIN D 125 MCG (5000 UT) PO CAPS: 5000.0000 [IU] | ORAL_CAPSULE | Freq: Every day | ORAL | Status: DC

## 2019-05-27 MED ORDER — ESCITALOPRAM OXALATE 10 MG PO TABS: 10.0000 mg | ORAL_TABLET | Freq: Every day | ORAL | Status: DC

## 2019-05-27 MED ORDER — ENOXAPARIN SODIUM 40 MG/0.4ML ~~LOC~~ SOLN
40.0000 mg | SUBCUTANEOUS | Status: DC
  Administered 2019-05-27: 08:00:00 40 mg via SUBCUTANEOUS
  Filled 2019-05-27: qty 0.4

## 2019-05-27 MED ORDER — ESCITALOPRAM OXALATE 10 MG PO TABS
10.0000 mg | ORAL_TABLET | Freq: Every day | ORAL | Status: DC
  Administered 2019-05-27 – 2019-05-29 (×3): 10 mg via ORAL
  Filled 2019-05-27 (×3): qty 1

## 2019-05-27 MED ORDER — SODIUM CHLORIDE 0.9 % IV SOLN: 200.0000 mg | Freq: Once | INTRAVENOUS | Status: DC

## 2019-05-27 MED ORDER — FERROUS SULFATE 325 (65 FE) MG PO TABS
325.0000 mg | ORAL_TABLET | Freq: Every day | ORAL | Status: DC
  Administered 2019-05-28 – 2019-05-30 (×3): 325 mg via ORAL
  Filled 2019-05-27 (×4): qty 1

## 2019-05-27 MED ORDER — DEXAMETHASONE SODIUM PHOSPHATE 10 MG/ML IJ SOLN: 6.0000 mg | INTRAMUSCULAR | Status: DC

## 2019-05-27 MED ORDER — ASCORBIC ACID 500 MG PO TABS
500.0000 mg | ORAL_TABLET | Freq: Every day | ORAL | Status: DC
  Administered 2019-05-27: 09:00:00 500 mg via ORAL
  Filled 2019-05-27: qty 1

## 2019-05-27 MED ORDER — DILTIAZEM HCL ER COATED BEADS 120 MG PO CP24
240.0000 mg | ORAL_CAPSULE | Freq: Every day | ORAL | Status: DC
  Administered 2019-05-28 – 2019-05-30 (×3): 240 mg via ORAL
  Filled 2019-05-27 (×3): qty 2

## 2019-05-27 MED ORDER — GUAIFENESIN ER 600 MG PO TB12
600.0000 mg | ORAL_TABLET | Freq: Two times a day (BID) | ORAL | Status: DC
  Administered 2019-05-27 (×2): 600 mg via ORAL
  Filled 2019-05-27 (×2): qty 1

## 2019-05-27 MED ORDER — PANTOPRAZOLE SODIUM 40 MG IV SOLR
40.0000 mg | Freq: Two times a day (BID) | INTRAVENOUS | Status: DC
  Administered 2019-05-27 – 2019-05-30 (×6): 40 mg via INTRAVENOUS
  Filled 2019-05-27 (×6): qty 40

## 2019-05-27 MED ORDER — LACTATED RINGERS IV SOLN: INTRAVENOUS | Status: AC

## 2019-05-27 MED ORDER — DILTIAZEM HCL ER COATED BEADS 240 MG PO CP24
240.0000 mg | ORAL_CAPSULE | Freq: Every day | ORAL | Status: DC
  Administered 2019-05-27: 240 mg via ORAL
  Filled 2019-05-27: qty 1

## 2019-05-27 MED ORDER — SODIUM CHLORIDE 0.9 % IV SOLN: INTRAVENOUS | Status: DC

## 2019-05-27 MED ORDER — POTASSIUM CHLORIDE ER 10 MEQ PO TBCR
10.0000 meq | EXTENDED_RELEASE_TABLET | Freq: Every day | ORAL | Status: DC
  Filled 2019-05-27: qty 1

## 2019-05-27 MED ORDER — FERROUS SULFATE 325 (65 FE) MG PO TABS
325.0000 mg | ORAL_TABLET | Freq: Every day | ORAL | Status: DC
  Administered 2019-05-27: 09:00:00 325 mg via ORAL
  Filled 2019-05-27: qty 1

## 2019-05-27 MED ORDER — VITAMIN D 25 MCG (1000 UNIT) PO TABS
5000.0000 [IU] | ORAL_TABLET | Freq: Every day | ORAL | Status: DC
  Administered 2019-05-27 – 2019-05-30 (×4): 5000 [IU] via ORAL
  Filled 2019-05-27 (×4): qty 5

## 2019-05-27 MED ORDER — HYDROCOD POLST-CPM POLST ER 10-8 MG/5ML PO SUER: 5.0000 mL | Freq: Two times a day (BID) | ORAL | Status: DC | PRN

## 2019-05-27 MED ORDER — GUAIFENESIN-DM 100-10 MG/5ML PO SYRP
10.0000 mL | ORAL_SOLUTION | ORAL | Status: DC | PRN
  Filled 2019-05-27: qty 10

## 2019-05-27 MED ORDER — ASPIRIN EC 81 MG PO TBEC
81.0000 mg | DELAYED_RELEASE_TABLET | Freq: Every day | ORAL | Status: DC
  Administered 2019-05-27: 09:00:00 81 mg via ORAL
  Filled 2019-05-27: qty 1

## 2019-05-27 MED ORDER — FAMOTIDINE 20 MG PO TABS
20.0000 mg | ORAL_TABLET | Freq: Two times a day (BID) | ORAL | Status: DC
  Administered 2019-05-27 (×2): 20 mg via ORAL
  Filled 2019-05-27 (×2): qty 1

## 2019-05-27 MED ORDER — ALENDRONATE SODIUM 70 MG PO TABS: 70.0000 mg | ORAL_TABLET | ORAL | Status: DC

## 2019-05-27 MED ORDER — FUROSEMIDE 20 MG PO TABS
20.0000 mg | ORAL_TABLET | Freq: Every day | ORAL | Status: DC
  Administered 2019-05-27: 20 mg via ORAL
  Filled 2019-05-27: qty 1

## 2019-05-27 MED ORDER — FUROSEMIDE 10 MG/ML IJ SOLN
60.0000 mg | Freq: Once | INTRAMUSCULAR | Status: AC
  Administered 2019-05-27: 09:00:00 60 mg via INTRAVENOUS
  Filled 2019-05-27 (×2): qty 8

## 2019-05-27 MED ORDER — ZINC SULFATE 220 (50 ZN) MG PO CAPS
220.0000 mg | ORAL_CAPSULE | Freq: Every day | ORAL | Status: DC
  Administered 2019-05-27: 09:00:00 220 mg via ORAL
  Filled 2019-05-27: qty 1

## 2019-05-27 MED ORDER — SODIUM CHLORIDE 0.9 % IV SOLN
100.0000 mg | Freq: Every day | INTRAVENOUS | Status: AC
  Administered 2019-05-27 – 2019-05-30 (×4): 100 mg via INTRAVENOUS
  Filled 2019-05-27 (×4): qty 20

## 2019-05-27 MED ORDER — ONDANSETRON HCL 4 MG/2ML IJ SOLN
4.0000 mg | Freq: Four times a day (QID) | INTRAMUSCULAR | Status: DC | PRN
  Administered 2019-05-27: 10:00:00 4 mg via INTRAVENOUS
  Filled 2019-05-27: qty 2

## 2019-05-27 MED ORDER — ONDANSETRON HCL 4 MG PO TABS: 4.0000 mg | ORAL_TABLET | Freq: Four times a day (QID) | ORAL | Status: DC | PRN

## 2019-05-27 MED ORDER — PANTOPRAZOLE SODIUM 40 MG PO TBEC: 40.0000 mg | DELAYED_RELEASE_TABLET | Freq: Every day | ORAL | Status: DC

## 2019-05-27 MED ORDER — SODIUM CHLORIDE 0.9 % IV SOLN
2.0000 g | INTRAVENOUS | Status: DC
  Administered 2019-05-27 – 2019-05-29 (×3): 2 g via INTRAVENOUS
  Filled 2019-05-27 (×3): qty 20

## 2019-05-27 MED ORDER — SODIUM CHLORIDE 0.9 % IV SOLN: 2.0000 g | INTRAVENOUS | Status: DC

## 2019-05-27 MED ORDER — SODIUM CHLORIDE 0.9 % IV SOLN
1.0000 g | Freq: Once | INTRAVENOUS | Status: AC
  Administered 2019-05-27: 02:00:00 1 g via INTRAVENOUS
  Filled 2019-05-27: qty 10

## 2019-05-27 MED ORDER — PANTOPRAZOLE SODIUM 40 MG PO TBEC
40.0000 mg | DELAYED_RELEASE_TABLET | Freq: Every day | ORAL | Status: DC
  Administered 2019-05-27: 09:00:00 40 mg via ORAL
  Filled 2019-05-27: qty 1

## 2019-05-27 MED ORDER — MOMETASONE FURO-FORMOTEROL FUM 100-5 MCG/ACT IN AERO
2.0000 | INHALATION_SPRAY | Freq: Two times a day (BID) | RESPIRATORY_TRACT | Status: DC
  Administered 2019-05-27 – 2019-05-30 (×6): 2 via RESPIRATORY_TRACT
  Filled 2019-05-27: qty 8.8

## 2019-05-27 MED ORDER — TRAMADOL HCL 50 MG PO TABS
50.0000 mg | ORAL_TABLET | Freq: Three times a day (TID) | ORAL | Status: DC
  Administered 2019-05-27 – 2019-05-30 (×8): 50 mg via ORAL
  Filled 2019-05-27 (×9): qty 1

## 2019-05-27 MED ORDER — POTASSIUM CHLORIDE CRYS ER 20 MEQ PO TBCR
40.0000 meq | EXTENDED_RELEASE_TABLET | Freq: Once | ORAL | Status: AC
  Administered 2019-05-27: 40 meq via ORAL
  Filled 2019-05-27: qty 2

## 2019-05-27 MED ORDER — TRAZODONE HCL 50 MG PO TABS: 50.0000 mg | ORAL_TABLET | Freq: Every evening | ORAL | Status: DC | PRN

## 2019-05-27 MED ORDER — ACETAMINOPHEN 325 MG PO TABS: 650.0000 mg | ORAL_TABLET | Freq: Four times a day (QID) | ORAL | Status: DC | PRN

## 2019-05-27 NOTE — H&P (Signed)
[place] at [facility]   PATIENT NAME: Alice Wallace    MR#:  [id number]  DATE OF BIRTH:  11-Aug-1940  DATE OF ADMISSION:  05/26/2019  PRIMARY CARE PHYSICIAN: Devoria Glassing, NP   REQUESTING/REFERRING PHYSICIAN: Chiquita Loth, MD CHIEF COMPLAINT:   Chief Complaint  Patient presents with  . Shortness of Breath    HISTORY OF PRESENT ILLNESS:  Alice Wallace  is a 79 y.o. female with a known history of COPD on O2 at 3 L/min, hypertension and osteoporosis, who presented to the emergency room with the onset of worsening dyspnea with associated hypoxemia.  Pulse oximetry was down to 77% with a on 3 L of O2 at her skilled nursing facility.  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  Upon presentation to the emergency room, blood pressure was 90/49 with respiratory 30s 23 and pulse 70 was 93% on 6 L O2 by high flow nasal cannula.  Blood pressure improved after 1 L bolus to 125/51.  Labs revealed CMP with hypokalemia of 3.2 and hypochloremia 89 with mild hyponatremia 133, BUN of 22 and creatinine 1.25 higher than previous levels.  CBC showed anemia better than previous levels.  LDH was 177 ferritin was elevated at 833.  Lactic acid was 1.8 and procalcitonin was 4.34.  High-sensitivity troponin I was 19.  Urinalysis was positive for UTI.  Fibrinogen was 465. EKG showed normal sinus rhythm with a rate of 91 with left anterior fascicular block and LVH by voltage criteria.  The patient was given IV remdesivir and Decadron as well as 1 L bolus of IV normal saline and IV Rocephin.  The patient will be admitted to medical monitored bed for further evaluation and management.  PAST MEDICAL HISTORY:   Past Medical History:  Diagnosis Date  . COPD (chronic obstructive pulmonary disease) (HCC)   . Hypertension   . Osteoporosis     PAST SURGICAL HISTORY:  No surgical history could be obtained from the patient.  SOCIAL HISTORY:   Social History   Tobacco Use    . Smoking status: Never Smoker  . Smokeless tobacco: Never Used  Substance Use Topics  . Alcohol use: Not Currently    FAMILY HISTORY:  No family history on file.  Family history is unknown and could not be obtained from the patient.Marland Kitchen  DRUG ALLERGIES:   Allergies  Allergen Reactions  . Hydrocodone     Other reaction(s): Unknown  . Naproxen     Other reaction(s): Unknown  . Other Other (See Comments)  . Sulfamethoxazole-Trimethoprim     Other reaction(s): Unknown  . Tramadol     Other reaction(s): Unknown    REVIEW OF SYSTEMS:   ROS As per history of present illness otherwise unobtainable. MEDICATIONS AT HOME:   Prior to Admission medications   Medication Sig Start Date End Date Taking? Authorizing Provider  acetaminophen (TYLENOL) 500 MG tablet Take 500 mg by mouth 3 (three) times daily.    [provider]  alendronate (FOSAMAX) 70 MG tablet Take 70 mg by mouth once a week. Take with a full glass of water on an empty stomach.    [provider]  aspirin EC 81 MG tablet Take 81 mg by mouth daily.    [provider]  cetirizine (ZYRTEC) 10 MG tablet Take 10 mg by mouth daily.    [provider]  Cholecalciferol (VITAMIN D) 125 MCG (5000 UT) CAPS Take 5,000 Units by mouth daily.  [provider]  diclofenac sodium (VOLTAREN) 1 % GEL Apply 2 g topically 3 (three) times daily. 01/03/19   [provider]  diltiazem (CARDIZEM CD) 240 MG 24 hr capsule Take 240 mg by mouth daily. 01/11/19   [provider]  escitalopram (LEXAPRO) 10 MG tablet Take 10 mg by mouth at bedtime. 06/08/16   [provider]  ferrous sulfate 325 (65 FE) MG tablet Take 325 mg by mouth daily.    [provider]  Fluticasone-Salmeterol (ADVAIR) 100-50 MCG/DOSE AEPB Inhale 1 puff into the lungs 2 (two) times daily.    [provider]  furosemide (LASIX) 20 MG tablet Take 20 mg by mouth daily.    [provider]   omeprazole (PRILOSEC) 40 MG capsule Take 40 mg by mouth daily. 01/11/19   [provider]  potassium chloride (K-DUR) 10 MEQ tablet Take 10 mEq by mouth daily.    [provider]  traMADol (ULTRAM) 50 MG tablet Take 50 mg by mouth every 8 (eight) hours. 01/02/19   [provider]      VITAL SIGNS:  Blood pressure (!) 121/53, pulse 89, temperature 98.7 F (37.1 C), temperature source Oral, resp. rate (!) 26, height 5\' 3"  (1.6 m), weight 59 kg, SpO2 97 %.  PHYSICAL EXAMINATION:  Physical Exam  GENERAL:  79 y.o.-year-old pleasant Caucasian female patient lying in the bed mild respiratory distress on high flow nasal cannula at 6 L. EYES: Pupils equal, round, reactive to light and accommodation. No scleral icterus. Extraocular muscles intact.  HEENT: Head atraumatic, normocephalic. Oropharynx and nasopharynx clear.  NECK:  Supple, no jugular venous distention. No thyroid enlargement, no tenderness.  LUNGS: Diminished bibasilar breath sounds with bibasal crackles.70  CARDIOVASCULAR: Regular rate and rhythm, S1, S2 normal. No murmurs, rubs, or gallops.  ABDOMEN: Soft, nondistended, nontender. Bowel sounds present. No organomegaly or mass.  EXTREMITIES: No pedal edema, cyanosis, or clubbing.  NEUROLOGIC: Cranial nerves II through XII are intact. Muscle strength 5/5 in all extremities. Sensation intact. Gait not checked.  PSYCHIATRIC: The patient is alert and oriented x 3.  Normal affect and good eye contact. SKIN: No obvious rash, lesion, or ulcer.   LABORATORY PANEL:   CBC Recent Labs  Lab 05/27/19 0000  WBC 10.3  HGB 10.4*  HCT 31.0*  PLT 190   ------------------------------------------------------------------------------------------------------------------  Chemistries  Recent Labs  Lab 05/27/19 0000  NA 133*  K 3.2*  CL 89*  CO2 30  GLUCOSE 125*  BUN 22  CREATININE 1.25*  CALCIUM 9.0  AST 27  ALT 15  ALKPHOS 54  BILITOT 0.8    ------------------------------------------------------------------------------------------------------------------  Cardiac Enzymes No results for input(s): TROPONINI in the last 168 hours. ------------------------------------------------------------------------------------------------------------------  RADIOLOGY:  DG Chest Port 1 View  Result Date: 05/27/2019 CLINICAL DATA:  80 year old female with respiratory distress. EXAM: PORTABLE CHEST 1 VIEW COMPARISON:  Chest radiograph dated 01/17/2019 and CT dated 11/21/2018. FINDINGS: Emphysema. Bilateral mid to lower lung field interstitial coarsening as well as hazy airspace density primarily involving the left lung base, likely chronic. Developing infiltrate is less likely. Clinical correlation is recommended. No pleural effusion or pneumothorax. Right upper lobe calcified nodules again noted. Stable cardiac silhouette. Atherosclerotic calcification of the aorta. No acute osseous pathology. IMPRESSION: 1. Left lower lung field density, likely chronic. Developing infiltrate is less likely but not excluded. Clinical correlation is recommended. 2. Advanced emphysema. Electronically Signed   By: 01/22/2019 M.D.   On: 05/27/2019 00:41  IMPRESSION AND PLAN:  1.  Acute hypoxemic respiratory failure secondary to COVID-19. -The patient will be admitted to a medically monitored isolation bed. -O2 protocol will be followed to keep O2 saturation above 93.   2.  Multifocal pneumonia and sepsis without severe sepsis or septic shock secondary to COVID-19.  This is manifested by hypotension and tachypnea with hypoxemia. -The patient will be admitted to an isolation monitored bed with droplet and contact precautions. -Given multifocal pneumonia we will empirically place the patient on IV Rocephin and Zithromax for possible bacterial superinfection only with elevated Procalcitonin. -The patient will be placed on scheduled Mucinex and as needed  Tussionex. -We will avoid nebulization as much as we can, give bronchodilator MDI if needed, and with deterioration of oxygenation try to avoid BiPAP/CPAP if possible.    -Will obtain sputum Gram stain culture and sensitivity and follow blood cultures. -O2 protocol will be followed. -We will follow CRP, ferritin, LDH and D-dimer. -Will follow manual differential for ANC/ALC ratio as well as follow troponin I and daily CBC with manual differential and CMP. - Will place the patient on IV Remdisivir and IV steroid therapy with Decadron with elevated inflammatory markers. -The patient will be placed on vitamin D3, vitamin C, zinc sulfate, p.o. Pepcid and aspirin.  3.  Acute kidney injury.  This likely prerenal secondary to volume depletion dehydration.  The patient will be hydrated with IV normal saline and will follow BMP and stop nephrotoxins.  4.  UTI.  The patient will be placed on IV Rocephin and will follow urine culture and sensitivity.  5.  Hypokalemia.  Potassium will be replaced and magnesium level will be checked.  6.  Hypertension.  We will continue Cardizem CD.  7.  COPD.  We will hold off Trelegy and Advair Diskus and place the patient on scheduled albuterol MDI.  8.  DVT prophylaxis.  Subcutaneous Lovenox.  All the records are reviewed and case discussed with ED provider. The plan of care was discussed in details with the patient (and family). I answered all questions. The patient agreed to proceed with the above mentioned plan. Further management will depend upon hospital course.   CODE STATUS: Presumably full code  TOTAL TIME TAKING CARE OF THIS PATIENT: 55 minutes.    Christel Mormon M.D on 05/27/2019 at 2:33 AM  [facility]   From 7 PM-7 AM, contact night-coverage [email or web address]  CC: Primary care physician; Odessa Fleming, NP   Note: This dictation was prepared with Dragon dictation along with smaller phrase technology. Any transcriptional errors that result  from this process are unintentional.

## 2019-05-27 NOTE — H&P (Signed)
History and Physical    Alice Wallace GMW:[id number] DOB: 15-May-1941 DOA: 05/27/2019  PCP: Devoria Glassing, NP  Patient coming from: home  I have personally briefly reviewed patient's old medical records in [facility]  Chief Complaint: SOB  HPI: Alice Wallace is Alice Wallace 79 y.o. female with medical history significant of COPD on 3 L, HTN, osteoporosis, deaf/mute who presented to the ED with SOB and hypoxemia.  Pulse ox was 77% on her 3 L at the SNF.  H&P obtained from previous notes as patient unable to contribute as she's deaf/mute and does not use sign language or read/write (on my attempt).  She was seen at [facility] and was noted to have soft BP and hypoxia.  She was admitted and started treatment for COVID and pneumonia with steroids, remdesivir, and antibiotics. She had increased O2 requirement from time of admission.  She received IVF and then Alice Wallace trial of lasix, but this did not improve her respiratory status.  She had large black BM in the ED.  She was started on protonix.  Hemoglobin has been stable.  She was transferred to [facility] for further evaluation and management.     She's Alice Wallace long term resident at [address]  She has guardian at [facility] DSS Alice Wallace - [phone] per previous notes).  Of note, on demographics this is different and lists Alice Wallace.  Will need to continue to reach out to DSS to discuss plan of care with guardian.  Review of Systems: unable to assess due to deaf/mute unable to read sign language or read/write   Past Medical History:  Diagnosis Date  . COPD (chronic obstructive pulmonary disease) (HCC)   . Deaf   . Hypertension   . Osteoporosis     reports that she has never smoked. She has never used smokeless tobacco. She reports previous alcohol use. She reports previous drug use.  Allergies  Allergen Reactions  . Hydrocodone     Other reaction(s): Unknown  . Naproxen     Other reaction(s): Unknown  . Other Other (See Comments)  .  Sulfamethoxazole-Trimethoprim     Other reaction(s): Unknown  . Tramadol     Other reaction(s): Unknown    No family history on file. Unable to obtain due to deaf/mute  Prior to Admission medications   Medication Sig Start Date End Date Taking? Authorizing Provider  acetaminophen (TYLENOL) 500 MG tablet Take 500 mg by mouth 3 (three) times daily.   Yes [provider]  acetaminophen (TYLENOL) 500 MG tablet Take 500 mg by mouth every 8 (eight) hours as needed for moderate pain or fever.   Yes [provider]  albuterol (PROVENTIL) (2.5 MG/3ML) 0.083% nebulizer solution Take 2.5 mg by nebulization every 6 (six) hours as needed for wheezing or shortness of breath.   Yes [provider]  albuterol (VENTOLIN HFA) 108 (90 Base) MCG/ACT inhaler Inhale 2 puffs into the lungs every 4 (four) hours as needed for wheezing or shortness of breath.   Yes [provider]  alendronate (FOSAMAX) 70 MG tablet Take 70 mg by mouth once Alice Wallace week. Take with Alice Wallace full glass of water on an empty stomach.   Yes [provider]  aluminum-magnesium hydroxide-simethicone (MAALOX) 200-200-20 MG/5ML SUSP Take 30 mLs by mouth 4 (four) times daily -  before meals and at bedtime.   Yes [provider]  aspirin EC 81 MG tablet Take 81 mg by mouth daily.   Yes [provider]  Calcium  Carbonate-Vitamin D3 (CALCIUM 600-D) 600-400 MG-UNIT TABS Take 1 tablet by mouth 2 (two) times daily.   Yes [provider]  cetirizine (ZYRTEC) 10 MG tablet Take 10 mg by mouth daily.   Yes [provider]  diclofenac sodium (VOLTAREN) 1 % GEL Apply 2 g topically 3 (three) times daily. 01/03/19  Yes [provider]  diltiazem (CARDIZEM CD) 240 MG 24 hr capsule Take 240 mg by mouth daily. 01/11/19  Yes [provider]  diphenhydrAMINE (BENADRYL) 25 MG tablet Take 25 mg by mouth every 6 (six) hours as needed for allergies.   Yes [provider]    escitalopram (LEXAPRO) 10 MG tablet Take 10 mg by mouth at bedtime. 06/08/16  Yes [provider]  ferrous sulfate 325 (65 FE) MG tablet Take 325 mg by mouth 2 (two) times daily with Alice Wallace meal.    Yes [provider]  Fluticasone-Salmeterol (ADVAIR) 100-50 MCG/DOSE AEPB Inhale 1 puff into the lungs 2 (two) times daily.   Yes [provider]  furosemide (LASIX) 20 MG tablet Take 20 mg by mouth daily.   Yes [provider]  guaifenesin (ROBITUSSIN) 100 MG/5ML syrup Take 300 mg by mouth 4 (four) times daily as needed for cough.   Yes [provider]  loperamide (IMODIUM Amany Rando-D) 2 MG tablet Take 2 mg by mouth daily as needed for diarrhea or loose stools.   Yes [provider]  metolazone (ZAROXOLYN) 5 MG tablet Take 5 mg by mouth daily.   Yes [provider]  omeprazole (PRILOSEC) 40 MG capsule Take 40 mg by mouth daily. 01/11/19  Yes [provider]  potassium chloride SA (KLOR-CON) 20 MEQ tablet Take 20 mEq by mouth daily.   Yes [provider]  tiZANidine (ZANAFLEX) 4 MG tablet Take 4 mg by mouth daily.   Yes [provider]  traMADol (ULTRAM) 50 MG tablet Take 50 mg by mouth every 8 (eight) hours. 01/02/19  Yes [provider]  traMADol (ULTRAM) 50 MG tablet Take 50 mg by mouth every 12 (twelve) hours as needed for moderate pain.   Yes [provider]  Cholecalciferol (VITAMIN D) 125 MCG (5000 UT) CAPS Take 5,000 Units by mouth daily.    [provider]    Physical Exam: Vitals:   05/27/19 1630 05/27/19 1940  BP: (!) 124/57 (!) 128/52  Pulse:  87  Resp:  20  Temp: 98.5 F (36.9 C) 99.3 F (37.4 C)  TempSrc: Oral Oral  SpO2:  96%    Constitutional: NAD, calm, comfortable Vitals:   05/27/19 1630 05/27/19 1940  BP: (!) 124/57 (!) 128/52  Pulse:  87  Resp:  20  Temp: 98.5 F (36.9 C) 99.3 F (37.4 C)  TempSrc: Oral Oral  SpO2:  96%   Eyes: PERRL, lids and conjunctivae  normal ENMT: Mucous membranes are moist. Posterior pharynx clear of any exudate or lesions.Normal dentition.  Neck: normal, supple, no masses, no thyromegaly Respiratory: clear to auscultation bilaterally, no wheezing, no crackles. Normal respiratory effort. No accessory muscle use.  Cardiovascular: Regular rate and rhythm, no murmurs / rubs / gallops. No extremity edema. Abdomen: no tenderness, no masses palpated. No hepatosplenomegaly. Bowel sounds positive.  Musculoskeletal: no clubbing / cyanosis. No joint deformity upper and lower extremities. Good ROM, no contractures. Normal muscle tone.  Skin: no rashes, lesions, ulcers. No induration Neurologic: CN 2-12 grossly intact. Does not speak.  Nods head, gives thumbs up.  Moving all extremities. Psychiatric: unable to  assess  Labs on dmission: I have personally reviewed following labs and imaging studies  CBC: Recent Labs  Lab 05/27/19 0000 05/27/19 1146  WBC 10.3  --   NEUTROABS 9.5*  --   HGB 10.4* 10.1*  HCT 31.0*  --   MCV 88.1  --   PLT 190  --    Basic Metabolic Panel: Recent Labs  Lab 05/27/19 0000  NA 133*  K 3.2*  CL 89*  CO2 30  GLUCOSE 125*  BUN 22  CREATININE 1.25*  CALCIUM 9.0   GFR: Estimated Creatinine Clearance: 30.7 mL/min (Alice Wallace) (by C-G formula based on SCr of 1.25 mg/dL (H)). Liver Function Tests: Recent Labs  Lab 05/27/19 0000  AST 27  ALT 15  ALKPHOS 54  BILITOT 0.8  PROT 7.0  ALBUMIN 4.0   No results for input(s): LIPASE, AMYLASE in the last 168 hours. No results for input(s): AMMONIA in the last 168 hours. Coagulation Profile: No results for input(s): INR, PROTIME in the last 168 hours. Cardiac Enzymes: No results for input(s): CKTOTAL, CKMB, CKMBINDEX, TROPONINI in the last 168 hours. BNP (last 3 results) No results for input(s): PROBNP in the last 8760 hours. HbA1C: No results for input(s): HGBA1C in the last 72 hours. CBG: No results for input(s): GLUCAP in the last 168  hours. Lipid Profile: No results for input(s): CHOL, HDL, LDLCALC, TRIG, CHOLHDL, LDLDIRECT in the last 72 hours. Thyroid Function Tests: No results for input(s): TSH, T4TOTAL, FREET4, T3FREE, THYROIDAB in the last 72 hours. Anemia Panel: Recent Labs    05/27/19 0000  FERRITIN 833*   Urine analysis:    Component Value Date/Time   COLORURINE YELLOW (Alice Wallace) 05/27/2019 0030   APPEARANCEUR CLOUDY (Alice Wallace) 05/27/2019 0030   LABSPEC 1.011 05/27/2019 0030   PHURINE 5.0 05/27/2019 0030   GLUCOSEU NEGATIVE 05/27/2019 0030   HGBUR NEGATIVE 05/27/2019 0030   BILIRUBINUR NEGATIVE 05/27/2019 0030   KETONESUR 5 (Alice Wallace) 05/27/2019 0030   PROTEINUR NEGATIVE 05/27/2019 0030   NITRITE NEGATIVE 05/27/2019 0030   LEUKOCYTESUR LARGE (Alice Wallace) 05/27/2019 0030    Radiological Exams on Admission: DG Chest Port 1 View  Result Date: 05/27/2019 CLINICAL DATA:  79 year old female with respiratory distress. EXAM: PORTABLE CHEST 1 VIEW COMPARISON:  Chest radiograph dated 01/17/2019 and CT dated 11/21/2018. FINDINGS: Emphysema. Bilateral mid to lower lung field interstitial coarsening as well as hazy airspace density primarily involving the left lung base, likely chronic. Developing infiltrate is less likely. Clinical correlation is recommended. No pleural effusion or pneumothorax. Right upper lobe calcified nodules again noted. Stable cardiac silhouette. Atherosclerotic calcification of the aorta. No acute osseous pathology. IMPRESSION: 1. Left lower lung field density, likely chronic. Developing infiltrate is less likely but not excluded. Clinical correlation is recommended. 2. Advanced emphysema. Electronically Signed   By: Anner Crete M.D.   On: 05/27/2019 00:41    EKG: Independently reviewed. Sinus rhythm   Assessment/Plan Active Problems:   COVID-19 virus infection  COVID 19 Virus Infection  Acute Hypoxic Respiratory Failure  Possible Superimposed CAP: Currently requiring 8 L HFNC Continue steroids and  remdesivir CXR 1/5 with L lower lung field density, possible developing infiltrate, emphysema Elevated procalcitonin, continue antibiotics for CAP Follow blood, urine cx Strict I/O, daily weights OOB, IS, prone as able CXR in AM  COVID-19 Labs  Recent Labs    05/27/19 0000  DDIMER 0.69*  FERRITIN 833*  LDH 177  CRP 10.0*    No results found for: SARSCOV2NAA  Melena  Positive Hemoccult:  Large black BM noted in ED Hemoglobin is stable  Positive hemoccult Given respiratory status will continue protonix BID and hold off on GI c/s for now with stable Hb Follow serial H/H  Acute Kidney Injury: creatinine bumped from 0.68 to 1.25.  UA without protein, 5-10 RBC's.  Hold additional lasix/metolazone Gentle IVF overnight, caution with AKI above  Pyuria: follow culture, on antibiotics as noted above  Hypokalemia: replace and follow  Hypertension: continue cardizem  COPD on 3 L chronically: continue inhalers  Mildly Elevated Troponin: flat, not concerning for ACS  Communication Barrier  Deaf  [place]: pt deaf and mute.  She does not read sign language and also did not seem to be able to read or write.  There is Jamelia Varano significant communication barrier.  Will need to try to contact Wallace her legal guardian and Alice Wallace from DSS.  [facility] term resident at [facility]  Goals of Care: consider palliative care c/s with difficulty communication.  Patient has guardian at [facility] DSS Alice Wallace)? Noted in d/c summary, though in demographics, Alice Wallace is listed as the legal guardian.  DVT prophylaxis: lovenox Code Status: full Family Communication: none at bedside, unable to reach [employer] (legal guardian) or Alice Wallace (DSS Disposition Plan: pending further improvement Consults called: none Admission status: inpatient   Lacretia Nicks MD [facility] Pager AMION  If 7PM-7AM, please contact night-coverage [email or web address] Password [email or web address]  05/27/2019, 8:01 PM

## 2019-05-27 NOTE — ED Notes (Addendum)
Pt had large black bowel movement.  Cleaned up, new brief and purewick placed. New sheets and gown given.  Dr Georgeann Oppenheim aware.

## 2019-05-27 NOTE — ED Notes (Signed)
Report off to vanessa rn 

## 2019-05-27 NOTE — ED Notes (Addendum)
Attempted to call DSS worker on-call since pt Legal Guardian is out of the office. No success to get in contact with DSS to notify about pt's transfer.

## 2019-05-27 NOTE — Plan of Care (Signed)

## 2019-05-27 NOTE — Progress Notes (Signed)
Ok to give KCL x1 this PM per Dr. Lowell Guitar.  Ulyses Southward, PharmD, BCIDP, AAHIVP, CPP Infectious Disease Pharmacist 05/27/2019 8:19 PM

## 2019-05-27 NOTE — Discharge Summary (Signed)
Physician Discharge Summary  Alice Wallace DUK:[id number] DOB: 10-14-1940 DOA: 05/26/2019  PCP: Alice Glassing, NP  Admit date: 05/26/2019 Discharge date: 05/27/2019  Admitted From: SNF/long-term care Disposition: [facility]   Discharge Condition: Guarded CODE STATUS: Full Diet recommendation: Per [facility] physician  Brief/Interim Summary: Per HPI: Alice Wallace  is a 79 y.o. female with a known history of COPD on O2 at 3 L/min, hypertension and osteoporosis, who presented to the emergency room with the onset of worsening dyspnea with associated hypoxemia.  Pulse oximetry was down to 77% with a on 3 L of O2 at her skilled nursing facility.  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  Upon presentation to the emergency room, blood pressure was 90/49 with respiratory 30s 23 and pulse 70 was 93% on 6 L O2 by high flow nasal cannula.  Blood pressure improved after 1 L bolus to 125/51.  Labs revealed CMP with hypokalemia of 3.2 and hypochloremia 89 with mild hyponatremia 133, BUN of 22 and creatinine 1.25 higher than previous levels.  CBC showed anemia better than previous levels.  LDH was 177 ferritin was elevated at 833.  Lactic acid was 1.8 and procalcitonin was 4.34.  High-sensitivity troponin I was 19.  Urinalysis was positive for UTI.  Fibrinogen was 465. EKG showed normal sinus rhythm with a rate of 91 with left anterior fascicular block and LVH by voltage criteria.  The patient was given IV remdesivir and Decadron as well as 1 L bolus of IV normal saline and IV Rocephin.  The patient will be admitted to medical monitored bed for further evaluation and management.  Hospital Course: Patient was admitted to the hospitalist service at [facility].  From the time of admission her oxygen requirement increased extensively.  On admission she was 93% on 6 L high flow nasal cannula.  The patient has a baseline oxygen requirement of 3 L nasal cannula at  skilled nursing facility.  Of note the patient is deaf mute and unfortunately does not read sign language.  Alternative modalities of communication were attempted and was unsuccessful.  Beyond the oxygen requirement the patient remained hemodynamically stable even though blood pressures are somewhat soft.  Intravenous fluids were discontinued and a trial of Lasix was given in an effort to reduce oxygen requirement.  This was unsuccessful.  During the emergency department stay the patient had a large black bowel movement.  Bedside stool guaiac was positive for bedside nurse.  Patient was started on Protonix 40 mg IV twice daily.  Repeat hemoglobin performed at this time did not demonstrate any significant drop from prior.  Defer GI consult at this time given the severity of the respiratory illness.  Patient will be transferred to [facility] for further evaluation and management.  Appreciate prompt response from [facility] staff.  The patient is a long-term resident at the [address]  I called the facility however they were unable to provide me with any information regarding this patient's clinical status or advanced directives.  The ER bedside nurse was able to determine that this patient has a public guardian at [facility].  I have contacted this individual.  His name is Alice Wallace [phone].  He stated this patient has a following case manager and any further discussion or decisions regarding change in CODE STATUS and advanced directive would be directed at the social worker.  He stated that he would get the social worker involved and they  would be in touch.  Nursing staff attempted to contact [facility] prior to transfer unable to do so.  Please contact this individual for further questions regarding this patient.  [place].      Discharge Diagnoses:  Active Problems:   Acute hypoxemic respiratory failure due to COVID-19 [facility])    Discharge  Instructions  Discharge Instructions    Diet - low sodium heart healthy   Complete by: As directed    Increase activity slowly   Complete by: As directed      Allergies as of 05/27/2019      Reactions   Hydrocodone    Other reaction(s): Unknown   Naproxen    Other reaction(s): Unknown   Other Other (See Comments)   Sulfamethoxazole-trimethoprim    Other reaction(s): Unknown   Tramadol    Other reaction(s): Unknown      Medication List    TAKE these medications   acetaminophen 500 MG tablet Commonly known as: TYLENOL Take 500 mg by mouth 3 (three) times daily.   alendronate 70 MG tablet Commonly known as: FOSAMAX Take 70 mg by mouth once a week. Take with a full glass of water on an empty stomach.   aspirin EC 81 MG tablet Take 81 mg by mouth daily.   cetirizine 10 MG tablet Commonly known as: ZYRTEC Take 10 mg by mouth daily.   diclofenac sodium 1 % Gel Commonly known as: VOLTAREN Apply 2 g topically 3 (three) times daily.   diltiazem 240 MG 24 hr capsule Commonly known as: CARDIZEM CD Take 240 mg by mouth daily.   escitalopram 10 MG tablet Commonly known as: LEXAPRO Take 10 mg by mouth at bedtime.   ferrous sulfate 325 (65 FE) MG tablet Take 325 mg by mouth daily.   Fluticasone-Salmeterol 100-50 MCG/DOSE Aepb Commonly known as: ADVAIR Inhale 1 puff into the lungs 2 (two) times daily.   furosemide 20 MG tablet Commonly known as: LASIX Take 20 mg by mouth daily.   omeprazole 40 MG capsule Commonly known as: PRILOSEC Take 40 mg by mouth daily.   potassium chloride 10 MEQ tablet Commonly known as: KLOR-CON Take 10 mEq by mouth daily.   traMADol 50 MG tablet Commonly known as: ULTRAM Take 50 mg by mouth every 8 (eight) hours.   Vitamin D 125 MCG (5000 UT) Caps Take 5,000 Units by mouth daily.       Allergies  Allergen Reactions  . Hydrocodone     Other reaction(s): Unknown  . Naproxen     Other reaction(s): Unknown  . Other Other (See  Comments)  . Sulfamethoxazole-Trimethoprim     Other reaction(s): Unknown  . Tramadol     Other reaction(s): Unknown    Consultations:  none   Procedures/Studies: DG Chest Port 1 View  Result Date: 05/27/2019 CLINICAL DATA:  79 year old female with respiratory distress. EXAM: PORTABLE CHEST 1 VIEW COMPARISON:  Chest radiograph dated 01/17/2019 and CT dated 11/21/2018. FINDINGS: Emphysema. Bilateral mid to lower lung field interstitial coarsening as well as hazy airspace density primarily involving the left lung base, likely chronic. Developing infiltrate is less likely. Clinical correlation is recommended. No pleural effusion or pneumothorax. Right upper lobe calcified nodules again noted. Stable cardiac silhouette. Atherosclerotic calcification of the aorta. No acute osseous pathology. IMPRESSION: 1. Left lower lung field density, likely chronic. Developing infiltrate is less likely but not excluded. Clinical correlation is recommended. 2. Advanced emphysema. Electronically Signed   By: Laren Everts.D.  On: 05/27/2019 00:41     Subjective: Seen and examined History limited by status and inability to understand sign language On 11 L nasal cannula  Discharge Exam: Vitals:   05/27/19 1400 05/27/19 1511  BP: (!) 127/53 (!) 143/55  Pulse: 82 78  Resp: 17 20  Temp:  98.6 F (37 C)  SpO2: 97% 97%   Vitals:   05/27/19 1300 05/27/19 1330 05/27/19 1400 05/27/19 1511  BP: (!) 129/53 137/74 (!) 127/53 (!) 143/55  Pulse: 84 85 82 78  Resp: (!) 25 14 17 20   Temp:    98.6 F (37 C)  TempSrc:      SpO2: (!) 78% 95% 97% 97%  Weight:      Height:        General: Pt is alert, awake, not in acute distress Cardiovascular: RRR, S1/S2 +, no rubs, no gallops Respiratory: CTA bilaterally, no wheezing, no rhonchi Abdominal: Soft, NT, ND, bowel sounds + Extremities: no edema, no cyanosis    The results of significant diagnostics from this hospitalization (including imaging,  microbiology, ancillary and laboratory) are listed below for reference.     Microbiology: Recent Results (from the past 240 hour(s))  Culture, blood (Routine X 2) w Reflex to ID Panel     Status: None (Preliminary result)   Collection Time: 05/27/19 12:00 AM   Specimen: BLOOD  Result Value Ref Range Status   Specimen Description BLOOD LEFT ANTECUBITAL  Final   Special Requests   Final    BOTTLES DRAWN AEROBIC AND ANAEROBIC Blood Culture adequate volume   Culture   Final    NO GROWTH < 12 HOURS Performed at [facility], [address] [place], [place]    Report Status PENDING  Incomplete  Culture, blood (Routine X 2) w Reflex to ID Panel     Status: None (Preliminary result)   Collection Time: 05/27/19 12:00 AM   Specimen: BLOOD  Result Value Ref Range Status   Specimen Description BLOOD RIGHT ASSIST CONTROL  Final   Special Requests   Final    BOTTLES DRAWN AEROBIC AND ANAEROBIC Blood Culture adequate volume   Culture   Final    NO GROWTH < 12 HOURS Performed at [facility], [address] [place], [place]    Report Status PENDING  Incomplete     Labs: BNP (last 3 results) No results for input(s): BNP in the last 8760 hours. Basic Metabolic Panel: Recent Labs  Lab 05/27/19 0000  NA 133*  K 3.2*  CL 89*  CO2 30  GLUCOSE 125*  BUN 22  CREATININE 1.25*  CALCIUM 9.0   Liver Function Tests: Recent Labs  Lab 05/27/19 0000  AST 27  ALT 15  ALKPHOS 54  BILITOT 0.8  PROT 7.0  ALBUMIN 4.0   No results for input(s): LIPASE, AMYLASE in the last 168 hours. No results for input(s): AMMONIA in the last 168 hours. CBC: Recent Labs  Lab 05/27/19 0000 05/27/19 1146  WBC 10.3  --   NEUTROABS 9.5*  --   HGB 10.4* 10.1*  HCT 31.0*  --   MCV 88.1  --   PLT 190  --    Cardiac Enzymes: No results for input(s): CKTOTAL, CKMB, CKMBINDEX, TROPONINI in the last 168 hours. BNP: Invalid input(s): POCBNP CBG: No results for  input(s): GLUCAP in the last 168 hours. D-Dimer Recent Labs    05/27/19 0000  DDIMER 0.69*   Hgb A1c No results for input(s):  HGBA1C in the last 72 hours. Lipid Profile No results for input(s): CHOL, HDL, LDLCALC, TRIG, CHOLHDL, LDLDIRECT in the last 72 hours. Thyroid function studies No results for input(s): TSH, T4TOTAL, T3FREE, THYROIDAB in the last 72 hours.  Invalid input(s): FREET3 Anemia work up Recent Labs    05/27/19 0000  FERRITIN 833*   Urinalysis    Component Value Date/Time   COLORURINE YELLOW (A) 05/27/2019 0030   APPEARANCEUR CLOUDY (A) 05/27/2019 0030   LABSPEC 1.011 05/27/2019 0030   PHURINE 5.0 05/27/2019 0030   GLUCOSEU NEGATIVE 05/27/2019 0030   HGBUR NEGATIVE 05/27/2019 0030   BILIRUBINUR NEGATIVE 05/27/2019 0030   KETONESUR 5 (A) 05/27/2019 0030   PROTEINUR NEGATIVE 05/27/2019 0030   NITRITE NEGATIVE 05/27/2019 0030   LEUKOCYTESUR LARGE (A) 05/27/2019 0030   Sepsis Labs Invalid input(s): PROCALCITONIN,  WBC,  LACTICIDVEN Microbiology Recent Results (from the past 240 hour(s))  Culture, blood (Routine X 2) w Reflex to ID Panel     Status: None (Preliminary result)   Collection Time: 05/27/19 12:00 AM   Specimen: BLOOD  Result Value Ref Range Status   Specimen Description BLOOD LEFT ANTECUBITAL  Final   Special Requests   Final    BOTTLES DRAWN AEROBIC AND ANAEROBIC Blood Culture adequate volume   Culture   Final    NO GROWTH < 12 HOURS Performed at [facility], [address] [place], [place] [address]    Report Status PENDING  Incomplete  Culture, blood (Routine X 2) w Reflex to ID Panel     Status: None (Preliminary result)   Collection Time: 05/27/19 12:00 AM   Specimen: BLOOD  Result Value Ref Range Status   Specimen Description BLOOD RIGHT ASSIST CONTROL  Final   Special Requests   Final    BOTTLES DRAWN AEROBIC AND ANAEROBIC Blood Culture adequate volume   Culture   Final    NO GROWTH < 12 HOURS Performed at [facility], [address], [place], [place] [address]    Report Status PENDING  Incomplete     Time coordinating discharge: Over 30 minutes  SIGNED:   Tresa Moore, MD  [facility] 05/27/2019, 3:17 PM Pager   If 7PM-7AM, please contact night-coverage [email or web address] Password [email or web address]

## 2019-05-27 NOTE — Progress Notes (Signed)
Remdesivir - Pharmacy Brief Note   O:  ALT: 15 CXR: evidence of infection SpO2: 96% on HFO   A/P:  Remdesivir 200 mg IVPB once followed by 100 mg IVPB daily x 4 days.   Valrie Hart, PharmD Clinical Pharmacist   05/27/2019 1:36 AM

## 2019-05-27 NOTE — Progress Notes (Signed)
PHARMACIST - PHYSICIAN COMMUNICATION  CONCERNING: P&T Medication Policy Regarding Oral Bisphosphonates  RECOMMENDATION: Your order for alendronate (Fosamax), ibandronate (Boniva), or risedronate (Actonel) has been discontinued at this time.  If the patient's post-hospital medical condition warrants safe use of this class of drugs, please resume the pre-hospital regimen upon discharge.  DESCRIPTION:  Alendronate (Fosamax), ibandronate (Boniva), and risedronate (Actonel) can cause severe esophageal erosions in patients who are unable to remain upright at least 30 minutes after taking this medication.   Since brief interruptions in therapy are thought to have minimal impact on bone mineral density, the Pharmacy & Therapeutics Committee has established that bisphosphonate orders should be routinely discontinued during hospitalization.   To override this safety policy and permit administration of Boniva, Fosamax, or Actonel in the hospital, prescribers must write "DO NOT HOLD" in the comments section when placing the order for this class of medications.  Gardner Candle, PharmD, BCPS Clinical Pharmacist 05/27/2019 7:55 AM

## 2019-05-27 NOTE — ED Notes (Addendum)
Per papers pts guardian is [place] [employer].  Spoke with sharon barlow at [employer] since normal case worker out of town.

## 2019-05-27 NOTE — Progress Notes (Signed)
SNF MAR:

## 2019-05-27 NOTE — ED Notes (Signed)
Pt placed on high flow oxygen by RT.  sats 96.  Pt alert.  nsr on moniotr.

## 2019-05-27 NOTE — ED Notes (Signed)
Breakfast tray given. °

## 2019-05-27 NOTE — ED Notes (Addendum)
Very difficult to communicate with pt.  Pt is deaf and unable to communicate with sign language or by writing.  Seems to follow commands and know what asking; opens mouth when put thermometer towards it.  Takes pills well per report.

## 2019-05-27 NOTE — ED Notes (Signed)
Pt vomiting. Cleaned and new gown placed. Will give prn meds.

## 2019-05-28 ENCOUNTER — Inpatient Hospital Stay (HOSPITAL_COMMUNITY): Payer: Medicare Other

## 2019-05-28 ENCOUNTER — Encounter (HOSPITAL_COMMUNITY): Payer: Self-pay | Admitting: Family Medicine

## 2019-05-28 DIAGNOSIS — R0902 Hypoxemia: Secondary | ICD-10-CM

## 2019-05-28 DIAGNOSIS — N179 Acute kidney failure, unspecified: Secondary | ICD-10-CM

## 2019-05-28 LAB — COMPREHENSIVE METABOLIC PANEL
ALT: 14 U/L (ref 0–44)
AST: 27 U/L (ref 15–41)
Albumin: 3.1 g/dL — ABNORMAL LOW (ref 3.5–5.0)
Alkaline Phosphatase: 41 U/L (ref 38–126)
Anion gap: 13 (ref 5–15)
BUN: 31 mg/dL — ABNORMAL HIGH (ref 8–23)
CO2: 32 mmol/L (ref 22–32)
Calcium: 8.1 mg/dL — ABNORMAL LOW (ref 8.9–10.3)
Chloride: 94 mmol/L — ABNORMAL LOW (ref 98–111)
Creatinine, Ser: 0.87 mg/dL (ref 0.44–1.00)
GFR calc Af Amer: >60 mL/min (ref >60)
GFR calc non Af Amer: >60 mL/min (ref >60)
Glucose, Bld: 117 mg/dL — ABNORMAL HIGH (ref 70–99)
Potassium: 3.1 mmol/L — ABNORMAL LOW (ref 3.5–5.1)
Sodium: 139 mmol/L (ref 135–145)
Total Bilirubin: 0.4 mg/dL (ref 0.3–1.2)
Total Protein: 6.1 g/dL — ABNORMAL LOW (ref 6.5–8.1)

## 2019-05-28 LAB — CBC WITH DIFFERENTIAL/PLATELET
Abs Immature Granulocytes: 0 10*3/uL (ref 0.00–0.07)
Band Neutrophils: 31 %
Basophils Absolute: 0 10*3/uL (ref 0.0–0.1)
Basophils Relative: 0 %
Eosinophils Absolute: 0 10*3/uL (ref 0.0–0.5)
Eosinophils Relative: 0 %
HCT: 28.4 % — ABNORMAL LOW (ref 36.0–46.0)
Hemoglobin: 9.1 g/dL — ABNORMAL LOW (ref 12.0–15.0)
Lymphocytes Relative: 3 %
Lymphs Abs: 0.4 10*3/uL — ABNORMAL LOW (ref 0.7–4.0)
MCH: 29.4 pg (ref 26.0–34.0)
MCHC: 32 g/dL (ref 30.0–36.0)
MCV: 91.9 fL (ref 80.0–100.0)
Monocytes Absolute: 0.5 10*3/uL (ref 0.1–1.0)
Monocytes Relative: 4 %
Neutro Abs: 11.2 10*3/uL — ABNORMAL HIGH (ref 1.7–7.7)
Neutrophils Relative %: 62 %
Platelets: 165 10*3/uL (ref 150–400)
RBC: 3.09 MIL/uL — ABNORMAL LOW (ref 3.87–5.11)
RDW: 14.6 % (ref 11.5–15.5)
WBC Morphology: INCREASED
WBC: 12 10*3/uL — ABNORMAL HIGH (ref 4.0–10.5)
nRBC: 0 % (ref 0.0–0.2)

## 2019-05-28 LAB — URINE CULTURE: Culture: >=100000 — AB

## 2019-05-28 LAB — C-REACTIVE PROTEIN: CRP: 26.9 mg/dL — ABNORMAL HIGH (ref <1.0)

## 2019-05-28 LAB — MAGNESIUM: Magnesium: 1.7 mg/dL (ref 1.7–2.4)

## 2019-05-28 LAB — D-DIMER, QUANTITATIVE: D-Dimer, Quant: 1.52 ug/mL-FEU — ABNORMAL HIGH (ref 0.00–0.50)

## 2019-05-28 LAB — HIV ANTIBODY (ROUTINE TESTING W REFLEX): HIV Screen 4th Generation wRfx: NONREACTIVE

## 2019-05-28 LAB — FERRITIN: Ferritin: 920 ng/mL — ABNORMAL HIGH (ref 11–307)

## 2019-05-28 LAB — SODIUM, URINE, RANDOM: Sodium, Ur: 68 mmol/L

## 2019-05-28 LAB — ABO/RH: ABO/RH(D): A POS

## 2019-05-28 LAB — CREATININE, URINE, RANDOM: Creatinine, Urine: 52.09 mg/dL

## 2019-05-28 LAB — PHOSPHORUS: Phosphorus: 3.2 mg/dL (ref 2.5–4.6)

## 2019-05-28 MED ORDER — POTASSIUM CHLORIDE CRYS ER 20 MEQ PO TBCR
40.0000 meq | EXTENDED_RELEASE_TABLET | Freq: Once | ORAL | Status: AC
  Administered 2019-05-28: 40 meq via ORAL
  Filled 2019-05-28: qty 2

## 2019-05-28 MED ORDER — INFLUENZA VAC A&B SA ADJ QUAD 0.5 ML IM PRSY
0.5000 mL | PREFILLED_SYRINGE | INTRAMUSCULAR | Status: DC
  Filled 2019-05-28: qty 0.5

## 2019-05-28 NOTE — Progress Notes (Signed)
PROGRESS NOTE                                                                                                                                                                                                             Patient Demographics:    Alice Wallace, is a 79 y.o. female, DOB - 01-04-1941, TDV:[id number]  Outpatient Primary MD for the patient is Devoria Glassing, NP   Admit date - 05/27/2019   LOS - 1  No chief complaint on file.      Brief Narrative: Patient is a 79 y.o. female with PMHx of COPD on 3 L of oxygen, HTN, osteoporosis, deaf/mute-who presented with shortness of breath-she was found to have acute on chronic hypoxemic respiratory failure secondary to COVID-19 pneumonia.  She was subsequently admitted to the hospitalist service.  See below for further details.   Subjective:    Alice Wallace today appears comfortable.  She does not understand when I write sentences down for her.  Attempted to communicate with her through iPad translator-sign language and deaf person instructor-but she was not able to understand.   Assessment  & Plan :   Acute on chronic hypoxic Resp Failure due to Covid 19 Viral pneumonia and concurrent bacterial pneumonia: Slowly improving-was down to 4 L of oxygen.  Continue remdesivir/steroids along with Rocephin/Zithromax.  She is on 3 L of oxygen at baseline.  Fever: afebrile  O2 requirements:  SpO2: 98 % O2 Flow Rate (L/min): 4 L/min   COVID-19 Labs: Recent Labs    05/27/19 0000 05/28/19 0447  DDIMER 0.69* 1.52*  FERRITIN 833* 920*  LDH 177  --   CRP 10.0* 26.9*    No results found for: BNP  Recent Labs  Lab 05/27/19 0000  PROCALCITON 4.34    No results found for: SARSCOV2NAA   COVID-19 Medications: Steroids: 1/4>> Remdesivir: 1/4>>  Other medications: Diuretics:Euvolemic-no need for lasix Antibiotics: Rocephin: 1/4>> Zithromax: 1/4>>  Prone/Incentive  Spirometry: encouraged ncentive spirometry use 3-4/hour.  DVT Prophylaxis  : SCDs given reported history of melena  Melena: Large melanotic stools noted in the ED-Per nursing staff-no major melanotic stools overnight.  Hemoglobin relatively stable-some mild decrease but patient was on IV fluids which I have stopped.  Continue PPI-if no significant GI bleeding-then do not think we need to pursue endoscopic/GI evaluation.  Follow closely.  E.  coli UTI: Given her deaf/mute status-not sure if she is symptomatic-in any event-she is being covered with Rocephin (used primarily for PNA)  AKI: Resolved-likely hemodynamically mediated.  COPD with chronic hypoxic respiratory failure on 3 L of oxygen: Stable-continue inhalers.  HTN: Controlled-continue Cardizem  RN pressure injury documentation: Pressure Injury 05/27/19 Ischial tuberosity Left Unstageable - Full thickness tissue loss in which the base of the injury is covered by slough (yellow, tan, gray, green or brown) and/or eschar (tan, brown or black) in the wound bed. (Active)  05/27/19 1630  Location: Ischial tuberosity  Location Orientation: Left  Staging: Unstageable - Full thickness tissue loss in which the base of the injury is covered by slough (yellow, tan, gray, green or brown) and/or eschar (tan, brown or black) in the wound bed.  Wound Description (Comments):   Present on Admission: Yes   Consults  :  None  Procedures  :  None ABG: No results found for: PHART, PCO2ART, PO2ART, HCO3, TCO2, ACIDBASEDEF, O2SAT  Vent Settings: N/A   Condition - Extremely Guarded  Family Communication  :  Left VM for legal gaurdian-Jewel Monroe  Code Status :  Full Code  Diet :  Diet Order            Diet Heart Room service appropriate? Yes; Fluid consistency: Thin  Diet effective now               Disposition Plan  :  Remain hospitalized  Barriers to discharge: Hypoxia requiring O2 supplementation/complete 5 days of IV Remdesivir   Antimicorbials  :    Anti-infectives (From admission, onward)   Start     Dose/Rate Route Frequency Ordered Stop   05/28/19 1000  remdesivir 100 mg in sodium chloride 0.9 % 100 mL IVPB  Status:  Discontinued     100 mg 200 mL/hr over 30 Minutes Intravenous Daily 05/27/19 1720 05/27/19 1727   05/27/19 2300  cefTRIAXone (ROCEPHIN) 2 g in sodium chloride 0.9 % 100 mL IVPB     2 g 200 mL/hr over 30 Minutes Intravenous Every 24 hours 05/27/19 2004 06/01/19 2259   05/27/19 2300  azithromycin (ZITHROMAX) 500 mg in sodium chloride 0.9 % 250 mL IVPB     500 mg 250 mL/hr over 60 Minutes Intravenous Every 24 hours 05/27/19 2004 06/01/19 2259   05/27/19 2000  remdesivir 100 mg in sodium chloride 0.9 % 100 mL IVPB     100 mg 200 mL/hr over 30 Minutes Intravenous Daily 05/27/19 1727 05/31/19 0959   05/27/19 1730  remdesivir 200 mg in sodium chloride 0.9% 250 mL IVPB  Status:  Discontinued     200 mg 580 mL/hr over 30 Minutes Intravenous Once 05/27/19 1720 05/27/19 1727      Inpatient Medications  Scheduled Meds: . acetaminophen  500 mg Oral TID  . aspirin EC  81 mg Oral Daily  . cholecalciferol  5,000 Units Oral Daily  . diltiazem  240 mg Oral Daily  . escitalopram  10 mg Oral QHS  . ferrous sulfate  325 mg Oral Daily  . loratadine  10 mg Oral Daily  . methylPREDNISolone (SOLU-MEDROL) injection  0.5 mg/kg Intravenous Q12H  . mometasone-formoterol  2 puff Inhalation BID  . pantoprazole (PROTONIX) IV  40 mg Intravenous Q12H  . traMADol  50 mg Oral Q8H   Continuous Infusions: . azithromycin 500 mg (05/27/19 2217)  . cefTRIAXone (ROCEPHIN)  IV 2 g (05/27/19 2219)  . lactated ringers 75 mL/hr at 05/28/19 1055  .  remdesivir 100 mg in NS 100 mL Stopped (05/28/19 1118)   PRN Meds:.albuterol, polyethylene glycol   Time Spent in minutes  35   See all Orders from today for further details   Oren Binet M.D on 05/28/2019 at 3:48 PM  To page go to [email or web address] - use universal password   [facility] -  Office  [phone]    Objective:   Vitals:   05/28/19 0100 05/28/19 0331 05/28/19 0807 05/28/19 1135  BP: (!) 113/49 (!) 130/59 (!) 128/55 (!) 112/53  Pulse: 78 89  79  Resp:    (!) 22  Temp:  98.2 F (36.8 C) 98.5 F (36.9 C) 98.4 F (36.9 C)  TempSrc:  Oral Oral Oral  SpO2:  96%  98%    Wt Readings from Last 3 Encounters:  05/26/19 59 kg     Intake/Output Summary (Last 24 hours) at 05/28/2019 1548 Last data filed at 05/28/2019 1118 Gross per 24 hour  Intake 340 ml  Output 500 ml  Net -160 ml     Physical Exam Gen Exam:Alert awake-not in any distress HEENT:atraumatic, normocephalic Chest: B/L clear to auscultation anteriorly CVS:S1S2 regular Abdomen:soft non tender, non distended Extremities:no edema Neurology: Non focal Skin: no rash   Data Review:    CBC Recent Labs  Lab 05/27/19 0000 05/27/19 1146 05/28/19 0447  WBC 10.3  --  12.0*  HGB 10.4* 10.1* 9.1*  HCT 31.0*  --  28.4*  PLT 190  --  165  MCV 88.1  --  91.9  MCH 29.5  --  29.4  MCHC 33.5  --  32.0  RDW 14.4  --  14.6  LYMPHSABS 0.4*  --  0.4*  MONOABS 0.5  --  0.5  EOSABS 0.0  --  0.0  BASOSABS 0.0  --  0.0    Chemistries  Recent Labs  Lab 05/27/19 0000 05/28/19 0447  NA 133* 139  K 3.2* 3.1*  CL 89* 94*  CO2 30 32  GLUCOSE 125* 117*  BUN 22 31*  CREATININE 1.25* 0.87  CALCIUM 9.0 8.1*  MG  --  1.7  AST 27 27  ALT 15 14  ALKPHOS 54 41  BILITOT 0.8 0.4   ------------------------------------------------------------------------------------------------------------------ No results for input(s): CHOL, HDL, LDLCALC, TRIG, CHOLHDL, LDLDIRECT in the last 72 hours.  No results found for: HGBA1C ------------------------------------------------------------------------------------------------------------------ No results for input(s): TSH, T4TOTAL, T3FREE, THYROIDAB in the last 72 hours.  Invalid input(s): FREET3  ------------------------------------------------------------------------------------------------------------------ Recent Labs    05/27/19 0000 05/28/19 0447  FERRITIN 833* 920*    Coagulation profile No results for input(s): INR, PROTIME in the last 168 hours.  Recent Labs    05/27/19 0000 05/28/19 0447  DDIMER 0.69* 1.52*    Cardiac Enzymes No results for input(s): CKMB, TROPONINI, MYOGLOBIN in the last 168 hours.  Invalid input(s): CK ------------------------------------------------------------------------------------------------------------------ No results found for: BNP  Micro Results Recent Results (from the past 240 hour(s))  Culture, blood (Routine X 2) w Reflex to ID Panel     Status: None (Preliminary result)   Collection Time: 05/27/19 12:00 AM   Specimen: BLOOD  Result Value Ref Range Status   Specimen Description BLOOD LEFT ANTECUBITAL  Final   Special Requests   Final    BOTTLES DRAWN AEROBIC AND ANAEROBIC Blood Culture adequate volume   Culture   Final    NO GROWTH 1 DAY Performed at [facility], [address] [place], [place] [address]    Report Status PENDING  Incomplete  Culture, blood (Routine X 2) w Reflex to ID Panel     Status: None (Preliminary result)   Collection Time: 05/27/19 12:00 AM   Specimen: BLOOD  Result Value Ref Range Status   Specimen Description BLOOD RIGHT ASSIST CONTROL  Final   Special Requests   Final    BOTTLES DRAWN AEROBIC AND ANAEROBIC Blood Culture adequate volume   Culture   Final    NO GROWTH 1 DAY Performed at [facility], [address] [place], [place] [address]    Report Status PENDING  Incomplete  Urine culture     Status: Abnormal   Collection Time: 05/27/19 12:30 AM   Specimen: Urine, Random  Result Value Ref Range Status   Specimen Description   Final    URINE, RANDOM Performed at [facility], [address] [place], [place] [address]    Special Requests   Final     NONE Performed at [facility], [address] [place], [place] [address]    Culture >=100,000 COLONIES/mL ESCHERICHIA COLI (A)  Final   Report Status 05/28/2019 FINAL  Final   Organism ID, Bacteria ESCHERICHIA COLI (A)  Final      Susceptibility   Escherichia coli - MIC*    AMPICILLIN 16 INTERMEDIATE Intermediate     CEFAZOLIN <=4 SENSITIVE Sensitive     CEFTRIAXONE <=0.25 SENSITIVE Sensitive     CIPROFLOXACIN <=0.25 SENSITIVE Sensitive     GENTAMICIN <=1 SENSITIVE Sensitive     IMIPENEM <=0.25 SENSITIVE Sensitive     NITROFURANTOIN <=16 SENSITIVE Sensitive     TRIMETH/SULFA <=20 SENSITIVE Sensitive     AMPICILLIN/SULBACTAM 4 SENSITIVE Sensitive     PIP/TAZO <=4 SENSITIVE Sensitive     * >=100,000 COLONIES/mL ESCHERICHIA COLI    Radiology Reports US RENAL  Result Date: 05/28/2019 CLINICAL DATA:  Acute kidney injury. EXAM: RENAL / URINARY TRACT ULTRASOUND COMPLETE COMPARISON:  None. FINDINGS: Right Kidney: Renal measurements: 10.4 x 4.2 x 4.3 cm = volume: 97.5 mL . Echogenicity within normal limits. No mass or hydronephrosis visualized. Left Kidney: Renal measurements: 10.3 x 5.5 x 5.9 cm = volume: 176 mL. Echogenicity within normal limits. No mass or hydronephrosis visualized. Bladder: Appears normal for degree of bladder distention. Other: None. IMPRESSION: Normal exam. Electronically Signed   By: Francene Boyers M.D.   On: 05/28/2019 09:39   DG CHEST PORT 1 VIEW  Result Date: 05/28/2019 CLINICAL DATA:  Hypoxia EXAM: PORTABLE CHEST 1 VIEW COMPARISON:  05/27/2019, 01/17/2019, 04/26/2017 FINDINGS: Unchanged AP portable examination with heterogeneous airspace opacity of the left lung base and severe underlying emphysema. No new airspace opacity. Numerous fracture and resection deformities of the ribs. The heart and mediastinum are unremarkable. IMPRESSION: Unchanged AP portable examination with heterogeneous airspace opacity of the left lung base, suspicious for infection or  aspiration although there is some degree of underlying chronic interstitial scarring. Electronically Signed   By: Lauralyn Primes M.D.   On: 05/28/2019 08:33   DG Chest Port 1 View  Result Date: 05/27/2019 CLINICAL DATA:  80 year old female with respiratory distress. EXAM: PORTABLE CHEST 1 VIEW COMPARISON:  Chest radiograph dated 01/17/2019 and CT dated 11/21/2018. FINDINGS: Emphysema. Bilateral mid to lower lung field interstitial coarsening as well as hazy airspace density primarily involving the left lung base, likely chronic. Developing infiltrate is less likely. Clinical correlation is recommended. No pleural effusion or pneumothorax. Right upper lobe calcified nodules again noted. Stable cardiac silhouette. Atherosclerotic calcification of the aorta. No acute osseous pathology.  IMPRESSION: 1. Left lower lung field density, likely chronic. Developing infiltrate is less likely but not excluded. Clinical correlation is recommended. 2. Advanced emphysema. Electronically Signed   By: Elgie CollardArash  Radparvar M.D.   On: 05/27/2019 00:41

## 2019-05-28 NOTE — Plan of Care (Signed)

## 2019-05-29 LAB — CBC WITH DIFFERENTIAL/PLATELET
Abs Immature Granulocytes: 0.18 10*3/uL — ABNORMAL HIGH (ref 0.00–0.07)
Basophils Absolute: 0 10*3/uL (ref 0.0–0.1)
Basophils Relative: 0 %
Eosinophils Absolute: 0 10*3/uL (ref 0.0–0.5)
Eosinophils Relative: 0 %
HCT: 28 % — ABNORMAL LOW (ref 36.0–46.0)
Hemoglobin: 8.8 g/dL — ABNORMAL LOW (ref 12.0–15.0)
Immature Granulocytes: 2 %
Lymphocytes Relative: 3 %
Lymphs Abs: 0.3 10*3/uL — ABNORMAL LOW (ref 0.7–4.0)
MCH: 29.3 pg (ref 26.0–34.0)
MCHC: 31.4 g/dL (ref 30.0–36.0)
MCV: 93.3 fL (ref 80.0–100.0)
Monocytes Absolute: 0.3 10*3/uL (ref 0.1–1.0)
Monocytes Relative: 3 %
Neutro Abs: 9 10*3/uL — ABNORMAL HIGH (ref 1.7–7.7)
Neutrophils Relative %: 92 %
Platelets: 184 10*3/uL (ref 150–400)
RBC: 3 MIL/uL — ABNORMAL LOW (ref 3.87–5.11)
RDW: 14.6 % (ref 11.5–15.5)
WBC: 9.8 10*3/uL (ref 4.0–10.5)
nRBC: 0 % (ref 0.0–0.2)

## 2019-05-29 LAB — EXPECTORATED SPUTUM ASSESSMENT W GRAM STAIN, RFLX TO RESP C

## 2019-05-29 LAB — COMPREHENSIVE METABOLIC PANEL
ALT: 13 U/L (ref 0–44)
AST: 23 U/L (ref 15–41)
Albumin: 3 g/dL — ABNORMAL LOW (ref 3.5–5.0)
Alkaline Phosphatase: 42 U/L (ref 38–126)
Anion gap: 13 (ref 5–15)
BUN: 34 mg/dL — ABNORMAL HIGH (ref 8–23)
CO2: 31 mmol/L (ref 22–32)
Calcium: 8.6 mg/dL — ABNORMAL LOW (ref 8.9–10.3)
Chloride: 96 mmol/L — ABNORMAL LOW (ref 98–111)
Creatinine, Ser: 0.56 mg/dL (ref 0.44–1.00)
GFR calc Af Amer: >60 mL/min (ref >60)
GFR calc non Af Amer: >60 mL/min (ref >60)
Glucose, Bld: 121 mg/dL — ABNORMAL HIGH (ref 70–99)
Potassium: 3.5 mmol/L (ref 3.5–5.1)
Sodium: 140 mmol/L (ref 135–145)
Total Bilirubin: 0.8 mg/dL (ref 0.3–1.2)
Total Protein: 6 g/dL — ABNORMAL LOW (ref 6.5–8.1)

## 2019-05-29 LAB — UREA NITROGEN, URINE: Urea Nitrogen, Ur: 494 mg/dL

## 2019-05-29 LAB — MAGNESIUM: Magnesium: 1.9 mg/dL (ref 1.7–2.4)

## 2019-05-29 LAB — FERRITIN: Ferritin: 918 ng/mL — ABNORMAL HIGH (ref 11–307)

## 2019-05-29 LAB — C-REACTIVE PROTEIN: CRP: 22.1 mg/dL — ABNORMAL HIGH (ref <1.0)

## 2019-05-29 LAB — PHOSPHORUS: Phosphorus: 2.4 mg/dL — ABNORMAL LOW (ref 2.5–4.6)

## 2019-05-29 LAB — D-DIMER, QUANTITATIVE: D-Dimer, Quant: 1.62 ug/mL-FEU — ABNORMAL HIGH (ref 0.00–0.50)

## 2019-05-29 MED ORDER — METHYLPREDNISOLONE SODIUM SUCC 40 MG IJ SOLR
40.0000 mg | Freq: Two times a day (BID) | INTRAMUSCULAR | Status: DC
  Administered 2019-05-29 – 2019-05-30 (×3): 40 mg via INTRAVENOUS
  Filled 2019-05-29 (×3): qty 1

## 2019-05-29 MED ORDER — MAGNESIUM SULFATE 2 GM/50ML IV SOLN
2.0000 g | Freq: Once | INTRAVENOUS | Status: AC
  Administered 2019-05-29: 2 g via INTRAVENOUS
  Filled 2019-05-29: qty 50

## 2019-05-29 NOTE — Progress Notes (Signed)
Palliative:  I spoke with Dr. Jerral Ralph who reports Alice Wallace has improved much over past 24 hours and likely nearing d/c. He has attempted to reach DSS guardian already. Will hold palliative consult and he will call if and re-consult if any palliative needs arise.   No charge  Yong Channel, NP Palliative Medicine Team Pager [phone] (Please see [email or web address] for schedule) Team Phone [phone]

## 2019-05-29 NOTE — Progress Notes (Signed)
PROGRESS NOTE                                                                                                                                                                                                             Patient Demographics:    Alice Wallace, is a 79 y.o. female, DOB - 03/06/1941, ZOX:[id number]  Outpatient Primary MD for the patient is Devoria GlassingLambert, Tracey, NP   Admit date - 05/27/2019   LOS - 2  No chief complaint on file.      Brief Narrative: Patient is a 79 y.o. female with PMHx of COPD on 3 L of oxygen, HTN, osteoporosis, deaf/mute-who presented with shortness of breath-she was found to have acute on chronic hypoxemic respiratory failure secondary to COVID-19 pneumonia.  She was subsequently admitted to the hospitalist service.  See below for further details.   Subjective:    Alice Wallace today appears comfortable-she is down to 3 L of oxygen.  Per nursing staff no major events overnight.  She does not understand sign language-even with the help of a iPad translator at bedside.  Does not seem to understand when I write things down for her.   Assessment  & Plan :   Acute on chronic hypoxic Resp Failure due to Covid 19 Viral pneumonia and concurrent bacterial pneumonia: Improved-down to 3 L of oxygen which is her baseline.  Continue steroids and remdesivir along with Rocephin/Zithromax.  Suspect that if improvement continues-she should be able to discharge back to her skilled nursing facility in the next few days.  Fever: afebrile  O2 requirements:  SpO2: 90 % O2 Flow Rate (L/min): 3 L/min   COVID-19 Labs: Recent Labs    05/27/19 0000 05/28/19 0447 05/29/19 0138  DDIMER 0.69* 1.52* 1.62*  FERRITIN 833* 920* 918*  LDH 177  --   --   CRP 10.0* 26.9* 22.1*    No results found for: BNP  Recent Labs  Lab 05/27/19 0000  PROCALCITON 4.34    No results found for: SARSCOV2NAA   COVID-19  Medications: Steroids: 1/4>> Remdesivir: 1/4>>  Other medications: Diuretics:Euvolemic-no need for lasix Antibiotics: Rocephin: 1/4>> Zithromax: 1/4>>  Prone/Incentive Spirometry: encouraged ncentive spirometry use 3-4/hour.  DVT Prophylaxis  : SCDs given reported history of melena  Melena: Large melanotic stools noted in the ED on admission-Per nursing staff-none noted here.  Hemoglobin trending down but I suspect that is from acute illness/hemodilution.  Continue PPI-suspect that if no overt bleeding continues-no need to pursue endoscopy evaluation/GI consultation.    E. coli UTI: Given her deaf/mute status-not sure if she is symptomatic-in any event-she is being covered with Rocephin (used primarily for PNA)  AKI: Resolved-likely hemodynamically mediated.  COPD with chronic hypoxic respiratory failure on 3 L of oxygen: Stable-continue inhalers.  HTN: Controlled-continue Cardizem  RN pressure injury documentation: Pressure Injury 05/27/19 Ischial tuberosity Left Unstageable - Full thickness tissue loss in which the base of the injury is covered by slough (yellow, tan, gray, green or brown) and/or eschar (tan, brown or black) in the wound bed. (Active)  05/27/19 1630  Location: Ischial tuberosity  Location Orientation: Left  Staging: Unstageable - Full thickness tissue loss in which the base of the injury is covered by slough (yellow, tan, gray, green or brown) and/or eschar (tan, brown or black) in the wound bed.  Wound Description (Comments):   Present on Admission: Yes   Consults  :  None  Procedures  :  None ABG: No results found for: PHART, PCO2ART, PO2ART, HCO3, TCO2, ACIDBASEDEF, O2SAT  Vent Settings: N/A   Condition - stable  Family Communication  :  Left VM for legal gaurdian-Jewel Monroe on 1/6, 1/7  Code Status :  Full Code  Diet :  Diet Order            Diet Heart Room service appropriate? Yes; Fluid consistency: Thin  Diet effective now                Disposition Plan  :  Remain hospitalized-potential discharge back to her nursing facility on 1/801/9.  Barriers to discharge: Hypoxia requiring O2 supplementation/complete 5 days of IV Remdesivir  Antimicorbials  :    Anti-infectives (From admission, onward)   Start     Dose/Rate Route Frequency Ordered Stop   05/28/19 1000  remdesivir 100 mg in sodium chloride 0.9 % 100 mL IVPB  Status:  Discontinued     100 mg 200 mL/hr over 30 Minutes Intravenous Daily 05/27/19 1720 05/27/19 1727   05/27/19 2300  cefTRIAXone (ROCEPHIN) 2 g in sodium chloride 0.9 % 100 mL IVPB     2 g 200 mL/hr over 30 Minutes Intravenous Every 24 hours 05/27/19 2004 06/01/19 2259   05/27/19 2300  azithromycin (ZITHROMAX) 500 mg in sodium chloride 0.9 % 250 mL IVPB     500 mg 250 mL/hr over 60 Minutes Intravenous Every 24 hours 05/27/19 2004 06/01/19 2259   05/27/19 2000  remdesivir 100 mg in sodium chloride 0.9 % 100 mL IVPB     100 mg 200 mL/hr over 30 Minutes Intravenous Daily 05/27/19 1727 05/31/19 0959   05/27/19 1730  remdesivir 200 mg in sodium chloride 0.9% 250 mL IVPB  Status:  Discontinued     200 mg 580 mL/hr over 30 Minutes Intravenous Once 05/27/19 1720 05/27/19 1727      Inpatient Medications  Scheduled Meds: . acetaminophen  500 mg Oral TID  . aspirin EC  81 mg Oral Daily  . cholecalciferol  5,000 Units Oral Daily  . diltiazem  240 mg Oral Daily  . escitalopram  10 mg Oral QHS  . ferrous sulfate  325 mg Oral Daily  . influenza vaccine adjuvanted  0.5 mL Intramuscular Tomorrow-1000  . loratadine  10 mg Oral Daily  . methylPREDNISolone (SOLU-MEDROL) injection  40 mg Intravenous Q12H  . mometasone-formoterol  2 puff Inhalation  BID  . pantoprazole (PROTONIX) IV  40 mg Intravenous Q12H  . traMADol  50 mg Oral Q8H   Continuous Infusions: . azithromycin 500 mg (05/28/19 2205)  . cefTRIAXone (ROCEPHIN)  IV 2 g (05/28/19 2202)  . remdesivir 100 mg in NS 100 mL 100 mg (05/29/19 1030)   PRN  Meds:.albuterol, polyethylene glycol   Time Spent in minutes  25  See all Orders from today for further details   Jeoffrey Massed M.D on 05/29/2019 at 2:43 PM  To page go to [email or web address] - use universal password  [facility] -  Office  [phone]    Objective:   Vitals:   05/29/19 0900 05/29/19 1000 05/29/19 1100 05/29/19 1200  BP:    (!) 123/56  Pulse: 76 70 84   Resp: 18 20 17 18   Temp:    98.8 F (37.1 C)  TempSrc:    Oral  SpO2: (!) 82% 92% 90%   Weight:      Height:        Wt Readings from Last 3 Encounters:  05/27/19 59 kg  05/26/19 59 kg     Intake/Output Summary (Last 24 hours) at 05/29/2019 1443 Last data filed at 05/29/2019 1300 Gross per 24 hour  Intake 2016.06 ml  Output --  Net 2016.06 ml     Physical Exam Gen Exam:Alert awake-not in any distress HEENT:atraumatic, normocephalic Chest: B/L clear to auscultation anteriorly CVS:S1S2 regular Abdomen:soft non tender, non distended Extremities:no edema Neurology: Non focal Skin: no rash   Data Review:    CBC Recent Labs  Lab 05/27/19 0000 05/27/19 1146 05/28/19 0447 05/29/19 0138  WBC 10.3  --  12.0* 9.8  HGB 10.4* 10.1* 9.1* 8.8*  HCT 31.0*  --  28.4* 28.0*  PLT 190  --  165 184  MCV 88.1  --  91.9 93.3  MCH 29.5  --  29.4 29.3  MCHC 33.5  --  32.0 31.4  RDW 14.4  --  14.6 14.6  LYMPHSABS 0.4*  --  0.4* 0.3*  MONOABS 0.5  --  0.5 0.3  EOSABS 0.0  --  0.0 0.0  BASOSABS 0.0  --  0.0 0.0    Chemistries  Recent Labs  Lab 05/27/19 0000 05/28/19 0447 05/29/19 0138  NA 133* 139 140  K 3.2* 3.1* 3.5  CL 89* 94* 96*  CO2 30 32 31  GLUCOSE 125* 117* 121*  BUN 22 31* 34*  CREATININE 1.25* 0.87 0.56  CALCIUM 9.0 8.1* 8.6*  MG  --  1.7 1.9  AST 27 27 23   ALT 15 14 13   ALKPHOS 54 41 42  BILITOT 0.8 0.4 0.8   ------------------------------------------------------------------------------------------------------------------ No results for input(s): CHOL, HDL, LDLCALC, TRIG,  CHOLHDL, LDLDIRECT in the last 72 hours.  No results found for: HGBA1C ------------------------------------------------------------------------------------------------------------------ No results for input(s): TSH, T4TOTAL, T3FREE, THYROIDAB in the last 72 hours.  Invalid input(s): FREET3 ------------------------------------------------------------------------------------------------------------------ Recent Labs    05/28/19 0447 05/29/19 0138  FERRITIN 920* 918*    Coagulation profile No results for input(s): INR, PROTIME in the last 168 hours.  Recent Labs    05/28/19 0447 05/29/19 0138  DDIMER 1.52* 1.62*    Cardiac Enzymes No results for input(s): CKMB, TROPONINI, MYOGLOBIN in the last 168 hours.  Invalid input(s): CK ------------------------------------------------------------------------------------------------------------------ No results found for: BNP  Micro Results Recent Results (from the past 240 hour(s))  Culture, blood (Routine X 2) w Reflex to ID Panel     Status: None (Preliminary result)  Collection Time: 05/27/19 12:00 AM   Specimen: BLOOD  Result Value Ref Range Status   Specimen Description BLOOD LEFT ANTECUBITAL  Final   Special Requests   Final    BOTTLES DRAWN AEROBIC AND ANAEROBIC Blood Culture adequate volume   Culture   Final    NO GROWTH 2 DAYS Performed at [facility], [address], [place], [place] [address]    Report Status PENDING  Incomplete  Culture, blood (Routine X 2) w Reflex to ID Panel     Status: None (Preliminary result)   Collection Time: 05/27/19 12:00 AM   Specimen: BLOOD  Result Value Ref Range Status   Specimen Description BLOOD RIGHT ASSIST CONTROL  Final   Special Requests   Final    BOTTLES DRAWN AEROBIC AND ANAEROBIC Blood Culture adequate volume   Culture   Final    NO GROWTH 2 DAYS Performed at [facility], [address] [place], [place] [address]    Report Status PENDING   Incomplete  Urine culture     Status: Abnormal   Collection Time: 05/27/19 12:30 AM   Specimen: Urine, Random  Result Value Ref Range Status   Specimen Description   Final    URINE, RANDOM Performed at [facility], [address] [place], [place] [address]    Special Requests   Final    NONE Performed at [facility], [address] [place], [place] [address]    Culture >=100,000 COLONIES/mL ESCHERICHIA COLI (A)  Final   Report Status 05/28/2019 FINAL  Final   Organism ID, Bacteria ESCHERICHIA COLI (A)  Final      Susceptibility   Escherichia coli - MIC*    AMPICILLIN 16 INTERMEDIATE Intermediate     CEFAZOLIN <=4 SENSITIVE Sensitive     CEFTRIAXONE <=0.25 SENSITIVE Sensitive     CIPROFLOXACIN <=0.25 SENSITIVE Sensitive     GENTAMICIN <=1 SENSITIVE Sensitive     IMIPENEM <=0.25 SENSITIVE Sensitive     NITROFURANTOIN <=16 SENSITIVE Sensitive     TRIMETH/SULFA <=20 SENSITIVE Sensitive     AMPICILLIN/SULBACTAM 4 SENSITIVE Sensitive     PIP/TAZO <=4 SENSITIVE Sensitive     * >=100,000 COLONIES/mL ESCHERICHIA COLI  Culture, sputum-assessment     Status: None   Collection Time: 05/27/19  8:05 PM   Specimen: Expectorated Sputum  Result Value Ref Range Status   Specimen Description EXPECTORATED SPUTUM  Final   Special Requests NONE  Final   Sputum evaluation   Final    THIS SPECIMEN IS ACCEPTABLE FOR SPUTUM CULTURE Performed at [facility], [address], [place], [place] [address]    Report Status 05/29/2019 FINAL  Final    Radiology Reports US RENAL  Result Date: 05/28/2019 CLINICAL DATA:  Acute kidney injury. EXAM: RENAL / URINARY TRACT ULTRASOUND COMPLETE COMPARISON:  None. FINDINGS: Right Kidney: Renal measurements: 10.4 x 4.2 x 4.3 cm = volume: 97.5 mL . Echogenicity within normal limits. No mass or hydronephrosis visualized. Left Kidney: Renal measurements: 10.3 x 5.5 x 5.9 cm = volume: 176 mL. Echogenicity within normal limits.  No mass or hydronephrosis visualized. Bladder: Appears normal for degree of bladder distention. Other: None. IMPRESSION: Normal exam. Electronically Signed   By: Francene Boyers M.D.   On: 05/28/2019 09:39   DG CHEST PORT 1 VIEW  Result Date: 05/28/2019 CLINICAL DATA:  Hypoxia EXAM: PORTABLE CHEST 1 VIEW COMPARISON:  05/27/2019, 01/17/2019, 04/26/2017 FINDINGS: Unchanged AP portable examination with heterogeneous airspace opacity of the  left lung base and severe underlying emphysema. No new airspace opacity. Numerous fracture and resection deformities of the ribs. The heart and mediastinum are unremarkable. IMPRESSION: Unchanged AP portable examination with heterogeneous airspace opacity of the left lung base, suspicious for infection or aspiration although there is some degree of underlying chronic interstitial scarring. Electronically Signed   By: Lauralyn Primes M.D.   On: 05/28/2019 08:33   DG Chest Port 1 View  Result Date: 05/27/2019 CLINICAL DATA:  79 year old female with respiratory distress. EXAM: PORTABLE CHEST 1 VIEW COMPARISON:  Chest radiograph dated 01/17/2019 and CT dated 11/21/2018. FINDINGS: Emphysema. Bilateral mid to lower lung field interstitial coarsening as well as hazy airspace density primarily involving the left lung base, likely chronic. Developing infiltrate is less likely. Clinical correlation is recommended. No pleural effusion or pneumothorax. Right upper lobe calcified nodules again noted. Stable cardiac silhouette. Atherosclerotic calcification of the aorta. No acute osseous pathology. IMPRESSION: 1. Left lower lung field density, likely chronic. Developing infiltrate is less likely but not excluded. Clinical correlation is recommended. 2. Advanced emphysema. Electronically Signed   By: Elgie Collard M.D.   On: 05/27/2019 00:41

## 2019-05-29 NOTE — Consult Note (Signed)
WOC Nurse Consult Note: Patient receiving care in CGV 9120.  Patient is COVID +. Consult completed remotely.  No photo of wound present at this time, however, there is flowsheet documentation of wound dimensions and characteristics when patient arrived on 05/27/19. Reason for Consult: unstageable left hip wound Wound type: unstageable Pressure Injury POA: Yes Measurement, Wound bed, Drainage (amount, consistency, odor), Periwound: in flowsheet section Dressing procedure/placement/frequency:  Place saline moistened gauze to left hip. Cover with ABD pad. Monitor the wound area(s) for worsening of condition such as: Signs/symptoms of infection,  Increase in size,  Development of or worsening of odor, Development of pain, or increased pain at the affected locations.  Notify the medical team if any of these develop.  Thank you for the consult. WOC nurse will not follow at this time.  Please re-consult the WOC team if needed.  Helmut Muster, RN, MSN, CWOCN, CNS-BC, pager [phone]

## 2019-05-30 LAB — CBC WITH DIFFERENTIAL/PLATELET
Abs Immature Granulocytes: 0.16 10*3/uL — ABNORMAL HIGH (ref 0.00–0.07)
Basophils Absolute: 0 10*3/uL (ref 0.0–0.1)
Basophils Relative: 0 %
Eosinophils Absolute: 0 10*3/uL (ref 0.0–0.5)
Eosinophils Relative: 0 %
HCT: 27.9 % — ABNORMAL LOW (ref 36.0–46.0)
Hemoglobin: 8.9 g/dL — ABNORMAL LOW (ref 12.0–15.0)
Immature Granulocytes: 2 %
Lymphocytes Relative: 4 %
Lymphs Abs: 0.3 10*3/uL — ABNORMAL LOW (ref 0.7–4.0)
MCH: 30.2 pg (ref 26.0–34.0)
MCHC: 31.9 g/dL (ref 30.0–36.0)
MCV: 94.6 fL (ref 80.0–100.0)
Monocytes Absolute: 0.4 10*3/uL (ref 0.1–1.0)
Monocytes Relative: 5 %
Neutro Abs: 6.4 10*3/uL (ref 1.7–7.7)
Neutrophils Relative %: 89 %
Platelets: 232 10*3/uL (ref 150–400)
RBC: 2.95 MIL/uL — ABNORMAL LOW (ref 3.87–5.11)
RDW: 14.7 % (ref 11.5–15.5)
WBC: 7.2 10*3/uL (ref 4.0–10.5)
nRBC: 0 % (ref 0.0–0.2)

## 2019-05-30 LAB — COMPREHENSIVE METABOLIC PANEL
ALT: 12 U/L (ref 0–44)
AST: 21 U/L (ref 15–41)
Albumin: 3 g/dL — ABNORMAL LOW (ref 3.5–5.0)
Alkaline Phosphatase: 45 U/L (ref 38–126)
Anion gap: 12 (ref 5–15)
BUN: 35 mg/dL — ABNORMAL HIGH (ref 8–23)
CO2: 32 mmol/L (ref 22–32)
Calcium: 8.9 mg/dL (ref 8.9–10.3)
Chloride: 99 mmol/L (ref 98–111)
Creatinine, Ser: 0.64 mg/dL (ref 0.44–1.00)
GFR calc Af Amer: >60 mL/min (ref >60)
GFR calc non Af Amer: >60 mL/min (ref >60)
Glucose, Bld: 123 mg/dL — ABNORMAL HIGH (ref 70–99)
Potassium: 3.3 mmol/L — ABNORMAL LOW (ref 3.5–5.1)
Sodium: 143 mmol/L (ref 135–145)
Total Bilirubin: 0.5 mg/dL (ref 0.3–1.2)
Total Protein: 6 g/dL — ABNORMAL LOW (ref 6.5–8.1)

## 2019-05-30 LAB — FERRITIN: Ferritin: 853 ng/mL — ABNORMAL HIGH (ref 11–307)

## 2019-05-30 LAB — C-REACTIVE PROTEIN: CRP: 13.5 mg/dL — ABNORMAL HIGH (ref <1.0)

## 2019-05-30 LAB — D-DIMER, QUANTITATIVE: D-Dimer, Quant: 1.32 ug/mL-FEU — ABNORMAL HIGH (ref 0.00–0.50)

## 2019-05-30 MED ORDER — PREDNISONE 10 MG PO TABS: ORAL_TABLET | ORAL | 0 refills | Status: DC

## 2019-05-30 MED ORDER — PANTOPRAZOLE SODIUM 40 MG PO TBEC: 40.0000 mg | DELAYED_RELEASE_TABLET | Freq: Every day | ORAL | 0 refills | Status: DC

## 2019-05-30 MED ORDER — POTASSIUM CHLORIDE CRYS ER 20 MEQ PO TBCR: 40.0000 meq | EXTENDED_RELEASE_TABLET | Freq: Every day | ORAL | 0 refills | Status: DC

## 2019-05-30 MED ORDER — CEFDINIR 300 MG PO CAPS: 300.0000 mg | ORAL_CAPSULE | Freq: Two times a day (BID) | ORAL | 0 refills | Status: AC

## 2019-05-30 MED ORDER — POTASSIUM CHLORIDE CRYS ER 20 MEQ PO TBCR
40.0000 meq | EXTENDED_RELEASE_TABLET | Freq: Once | ORAL | Status: AC
  Administered 2019-05-30: 40 meq via ORAL
  Filled 2019-05-30: qty 2

## 2019-05-30 MED ORDER — TRAMADOL HCL 50 MG PO TABS: 50.0000 mg | ORAL_TABLET | Freq: Three times a day (TID) | ORAL | 0 refills | Status: AC

## 2019-05-30 NOTE — Plan of Care (Signed)

## 2019-05-30 NOTE — Progress Notes (Signed)
Attempted to reach [facility] and [facility] P[facility] to inform them of patient's d/c back to [facility]. Left messages with each of them.

## 2019-05-30 NOTE — Progress Notes (Signed)
PTAR called for transportation. Patient currently active with [facility]- PT/OT and already set up with O2 services at patients ALF, [facility] [address] of [place]. No other needs at this time.   Stacy Gardner, LCSW Transitions of Care Department System Wide Float  [phone]

## 2019-05-30 NOTE — TOC Transition Note (Signed)
Transition of Care [facility]) - CM/SW Discharge Note   Patient Details  Name: Alice Wallace MRN: [id number] Date of Birth: 1941/01/02  Transition of Care [facility]) CM/SW Contact:  Donnie Coffin, LCSW Phone Number: 05/30/2019, 3:43 PM   Clinical Narrative:     PTAR called for transportation. Patient currently active with [facility]- PT/OT and already set up with O2 services at patients ALF, [facility] [address] of [place]. No other needs at this time.   Stacy Gardner, LCSW Transitions of Care Department System Wide Float  [phone]         Patient Goals and CMS Choice        Discharge Placement                       Discharge Plan and Services                                     Social Determinants of Health (SDOH) Interventions     Readmission Risk Interventions No flowsheet data found.

## 2019-05-30 NOTE — Discharge Instructions (Signed)
Person Under Monitoring Name: Alice Wallace  Location: [address] [place] [address]   Infection Prevention Recommendations for Individuals Confirmed to have, or Being Evaluated for, 2019 Novel Coronavirus (COVID-19) Infection Who Receive Care at Home  Individuals who are confirmed to have, or are being evaluated for, COVID-19 should follow the prevention steps below until a healthcare provider or local or state health department says they can return to normal activities.  Stay home except to get medical care You should restrict activities outside your home, except for getting medical care. Do not go to work, school, or public areas, and do not use public transportation or taxis.  Call ahead before visiting your doctor Before your medical appointment, call the healthcare provider and tell them that you have, or are being evaluated for, COVID-19 infection. This will help the healthcare provider's office take steps to keep other people from getting infected. Ask your healthcare provider to call the local or state health department.  Monitor your symptoms Seek prompt medical attention if your illness is worsening (e.g., difficulty breathing). Before going to your medical appointment, call the healthcare provider and tell them that you have, or are being evaluated for, COVID-19 infection. Ask your healthcare provider to call the local or state health department.  Wear a facemask You should wear a facemask that covers your nose and mouth when you are in the same room with other people and when you visit a healthcare provider. People who live with or visit you should also wear a facemask while they are in the same room with you.  Separate yourself from other people in your home As much as possible, you should stay in a different room from other people in your home. Also, you should use a separate bathroom, if available.  Avoid sharing household items You should not  share dishes, drinking glasses, cups, eating utensils, towels, bedding, or other items with other people in your home. After using these items, you should wash them thoroughly with soap and water.  Cover your coughs and sneezes Cover your mouth and nose with a tissue when you cough or sneeze, or you can cough or sneeze into your sleeve. Throw used tissues in a lined trash can, and immediately wash your hands with soap and water for at least 20 seconds or use an alcohol-based hand rub.  Wash your [employer] your hands often and thoroughly with soap and water for at least 20 seconds. You can use an alcohol-based hand sanitizer if soap and water are not available and if your hands are not visibly dirty. Avoid touching your eyes, nose, and mouth with unwashed hands.   Prevention Steps for Caregivers and Household Members of Individuals Confirmed to have, or Being Evaluated for, COVID-19 Infection Being Cared for in the Home  If you live with, or provide care at home for, a person confirmed to have, or being evaluated for, COVID-19 infection please follow these guidelines to prevent infection:  Follow healthcare provider's instructions Make sure that you understand and can help the patient follow any healthcare provider instructions for all care.  Provide for the patient's basic needs You should help the patient with basic needs in the home and provide support for getting groceries, prescriptions, and other personal needs.  Monitor the patient's symptoms If they are getting sicker, call his or her medical provider and tell them that the patient has, or is being evaluated for, COVID-19 infection. This will help the healthcare provider's office take  steps to keep other people from getting infected. Ask the healthcare provider to call the local or state health department.  Limit the number of people who have contact with the patient  If possible, have only one caregiver for the  patient.  Other household members should stay in another home or place of residence. If this is not possible, they should stay  in another room, or be separated from the patient as much as possible. Use a separate bathroom, if available.  Restrict visitors who do not have an essential need to be in the home.  Keep older adults, very young children, and other sick people away from the patient Keep older adults, very young children, and those who have compromised immune systems or chronic health conditions away from the patient. This includes people with chronic heart, lung, or kidney conditions, diabetes, and cancer.  Ensure good ventilation Make sure that shared spaces in the home have good air flow, such as from an air conditioner or an opened window, weather permitting.  Wash your hands often  Wash your hands often and thoroughly with soap and water for at least 20 seconds. You can use an alcohol based hand sanitizer if soap and water are not available and if your hands are not visibly dirty.  Avoid touching your eyes, nose, and mouth with unwashed hands.  Use disposable paper towels to dry your hands. If not available, use dedicated cloth towels and replace them when they become wet.  Wear a facemask and gloves  Wear a disposable facemask at all times in the room and gloves when you touch or have contact with the patient's blood, body fluids, and/or secretions or excretions, such as sweat, saliva, sputum, nasal mucus, vomit, urine, or feces.  Ensure the mask fits over your nose and mouth tightly, and do not touch it during use.  Throw out disposable facemasks and gloves after using them. Do not reuse.  Wash your hands immediately after removing your facemask and gloves.  If your personal clothing becomes contaminated, carefully remove clothing and launder. Wash your hands after handling contaminated clothing.  Place all used disposable facemasks, gloves, and other waste in a lined  container before disposing them with other household waste.  Remove gloves and wash your hands immediately after handling these items.  Do not share dishes, glasses, or other household items with the patient  Avoid sharing household items. You should not share dishes, drinking glasses, cups, eating utensils, towels, bedding, or other items with a patient who is confirmed to have, or being evaluated for, COVID-19 infection.  After the person uses these items, you should wash them thoroughly with soap and water.  Wash laundry thoroughly  Immediately remove and wash clothes or bedding that have blood, body fluids, and/or secretions or excretions, such as sweat, saliva, sputum, nasal mucus, vomit, urine, or feces, on them.  Wear gloves when handling laundry from the patient.  Read and follow directions on labels of laundry or clothing items and detergent. In general, wash and dry with the warmest temperatures recommended on the label.  Clean all areas the individual has used often  Clean all touchable surfaces, such as counters, tabletops, doorknobs, bathroom fixtures, toilets, phones, keyboards, tablets, and bedside tables, every day. Also, clean any surfaces that may have blood, body fluids, and/or secretions or excretions on them.  Wear gloves when cleaning surfaces the patient has come in contact with.  Use a diluted bleach solution (e.g., dilute bleach with 1 part bleach  and 10 parts water) or a household disinfectant with a label that says EPA-registered for coronaviruses. To make a bleach solution at home, add 1 tablespoon of bleach to 1 quart (4 cups) of water. For a larger supply, add  cup of bleach to 1 gallon (16 cups) of water.  Read labels of cleaning products and follow recommendations provided on product labels. Labels contain instructions for safe and effective use of the cleaning product including precautions you should take when applying the product, such as wearing gloves or  eye protection and making sure you have good ventilation during use of the product.  Remove gloves and wash hands immediately after cleaning.  Monitor yourself for signs and symptoms of illness Caregivers and household members are considered close contacts, should monitor their health, and will be asked to limit movement outside of the home to the extent possible. Follow the monitoring steps for close contacts listed on the symptom monitoring form.   ? If you have additional questions, contact your local health department or call the epidemiologist on call at [phone] (available 24/7). ? This guidance is subject to change. For the most up-to-date guidance from [facility], please refer to their website: [email or web address]

## 2019-05-30 NOTE — Progress Notes (Signed)
PROGRESS NOTE                                                                                                                                                                                                             Patient Demographics:    Alice Wallace, is a 79 y.o. female, DOB - 05-07-1941, ZOX:[id number]  Outpatient Primary MD for the patient is Devoria Glassing, NP   Admit date - 05/27/2019   LOS - 3  No chief complaint on file.      Brief Narrative: Patient is a 79 y.o. female with PMHx of COPD on 3 L of oxygen, HTN, osteoporosis, deaf/mute-who presented with shortness of breath-she was found to have acute on chronic hypoxemic respiratory failure secondary to COVID-19 pneumonia.  She was subsequently admitted to the hospitalist service.  See below for further details.   Subjective:   Appears very comfortable-no major events overnight per RN.  She does not understand sign language-even with the help of a iPad translator at bedside.  Does not seem to understand when I write things down for her.   Assessment  & Plan :   Acute on chronic hypoxic Resp Failure due to Covid 19 Viral pneumonia and concurrent bacterial pneumonia: Continues to improve-stable on 3 L of oxygen which is her home regimen.  Has completed remdesivir on 1/8-continue steroids and empiric Rocephin.  Fever: afebrile  O2 requirements:  SpO2: 93 % O2 Flow Rate (L/min): 3 L/min   COVID-19 Labs: Recent Labs    05/28/19 0447 05/29/19 0138 05/30/19 0057  DDIMER 1.52* 1.62* 1.32*  FERRITIN 920* 918* 853*  CRP 26.9* 22.1* 13.5*    No results found for: BNP  Recent Labs  Lab 05/27/19 0000  PROCALCITON 4.34    No results found for: SARSCOV2NAA   COVID-19 Medications: Steroids: 1/4>> Remdesivir: 1/4>> 1/8  Other medications: Diuretics:Euvolemic-no need for lasix Antibiotics: Rocephin: 1/4>> Zithromax: 1/4>> 1/8  Prone/Incentive  Spirometry: encouraged ncentive spirometry use 3-4/hour.  DVT Prophylaxis  : SCDs given reported history of melena  Melena: Large melanotic stools noted in the ED on admission-Per nursing staff-none noted here.  Stable at 8.9.  Continue PPI.  Since hemoglobin stable-and no further melena-no need for GI/endoscopic evaluation during this hospital stay, can be pursued in the outpatient setting if needed.  E. coli UTI: Given her deaf/mute  status-not sure if she is symptomatic-in any event-she is being covered with Rocephin (used primarily for PNA)  AKI: Resolved-likely hemodynamically mediated.  Hypokalemia: Replete and recheck.  COPD with chronic hypoxic respiratory failure on 3 L of oxygen: Stable-continue inhalers.  HTN: Controlled-continue Cardizem  RN pressure injury documentation: Pressure Injury 05/27/19 Ischial tuberosity Left Unstageable - Full thickness tissue loss in which the base of the injury is covered by slough (yellow, tan, gray, green or brown) and/or eschar (tan, brown or black) in the wound bed. (Active)  05/27/19 1630  Location: Ischial tuberosity  Location Orientation: Left  Staging: Unstageable - Full thickness tissue loss in which the base of the injury is covered by slough (yellow, tan, gray, green or brown) and/or eschar (tan, brown or black) in the wound bed.  Wound Description (Comments):   Present on Admission: Yes   Consults  :  None  Procedures  :  None ABG: No results found for: PHART, PCO2ART, PO2ART, HCO3, TCO2, ACIDBASEDEF, O2SAT  Vent Settings: N/A   Condition - stable  Family Communication  :  Left VM for legal gaurdian-Jewel Monroe on 1/6, 1/7,1/8  Code Status :  Full Code  Diet :  Diet Order            Diet Heart Room service appropriate? Yes; Fluid consistency: Thin  Diet effective now               Disposition Plan  :  Remain hospitalized-back to SNF vs ALF depending on social work -pending bed availability  Barriers to  discharge: Awaiting bed availability at SNF/ALF-awaiting PT eval.  Antimicorbials  :    Anti-infectives (From admission, onward)   Start     Dose/Rate Route Frequency Ordered Stop   05/28/19 1000  remdesivir 100 mg in sodium chloride 0.9 % 100 mL IVPB  Status:  Discontinued     100 mg 200 mL/hr over 30 Minutes Intravenous Daily 05/27/19 1720 05/27/19 1727   05/27/19 2300  cefTRIAXone (ROCEPHIN) 2 g in sodium chloride 0.9 % 100 mL IVPB     2 g 200 mL/hr over 30 Minutes Intravenous Every 24 hours 05/27/19 2004 06/01/19 2259   05/27/19 2300  azithromycin (ZITHROMAX) 500 mg in sodium chloride 0.9 % 250 mL IVPB     500 mg 250 mL/hr over 60 Minutes Intravenous Every 24 hours 05/27/19 2004 06/01/19 2259   05/27/19 2000  remdesivir 100 mg in sodium chloride 0.9 % 100 mL IVPB     100 mg 200 mL/hr over 30 Minutes Intravenous Daily 05/27/19 1727 05/30/19 1040   05/27/19 1730  remdesivir 200 mg in sodium chloride 0.9% 250 mL IVPB  Status:  Discontinued     200 mg 580 mL/hr over 30 Minutes Intravenous Once 05/27/19 1720 05/27/19 1727      Inpatient Medications  Scheduled Meds: . acetaminophen  500 mg Oral TID  . aspirin EC  81 mg Oral Daily  . cholecalciferol  5,000 Units Oral Daily  . diltiazem  240 mg Oral Daily  . escitalopram  10 mg Oral QHS  . ferrous sulfate  325 mg Oral Daily  . influenza vaccine adjuvanted  0.5 mL Intramuscular Tomorrow-1000  . loratadine  10 mg Oral Daily  . methylPREDNISolone (SOLU-MEDROL) injection  40 mg Intravenous Q12H  . mometasone-formoterol  2 puff Inhalation BID  . pantoprazole (PROTONIX) IV  40 mg Intravenous Q12H  . traMADol  50 mg Oral Q8H   Continuous Infusions: . azithromycin Stopped (05/30/19 0730)  .  cefTRIAXone (ROCEPHIN)  IV Stopped (05/30/19 0620)   PRN Meds:.albuterol, polyethylene glycol   Time Spent in minutes  25  See all Orders from today for further details   Jeoffrey Massed M.D on 05/30/2019 at 2:27 PM  To page go to  [email or web address] - use universal password  [facility] -  Office  [phone]    Objective:   Vitals:   05/30/19 0405 05/30/19 0535 05/30/19 0727 05/30/19 1100  BP: 138/64  (!) 141/58 (!) 137/58  Pulse: 79 79 73 82  Resp: (!) 22 20 18 17   Temp: 98.6 F (37 C)  98.2 F (36.8 C) 98 F (36.7 C)  TempSrc: Oral  Oral Oral  SpO2: (!) 88% 94% 91% 93%  Weight:      Height:        Wt Readings from Last 3 Encounters:  05/27/19 59 kg  05/26/19 59 kg     Intake/Output Summary (Last 24 hours) at 05/30/2019 1427 Last data filed at 05/30/2019 0535 Gross per 24 hour  Intake 640 ml  Output 1200 ml  Net -560 ml     Physical Exam Gen Exam: Not in any distress. HEENT:atraumatic, normocephalic Chest: B/L clear to auscultation anteriorly CVS:S1S2 regular Abdomen:soft non tender, non distended Extremities:no edema Neurology: Non focal Skin: no rash   Data Review:    CBC Recent Labs  Lab 05/27/19 0000 05/27/19 1146 05/28/19 0447 05/29/19 0138 05/30/19 0057  WBC 10.3  --  12.0* 9.8 7.2  HGB 10.4* 10.1* 9.1* 8.8* 8.9*  HCT 31.0*  --  28.4* 28.0* 27.9*  PLT 190  --  165 184 232  MCV 88.1  --  91.9 93.3 94.6  MCH 29.5  --  29.4 29.3 30.2  MCHC 33.5  --  32.0 31.4 31.9  RDW 14.4  --  14.6 14.6 14.7  LYMPHSABS 0.4*  --  0.4* 0.3* 0.3*  MONOABS 0.5  --  0.5 0.3 0.4  EOSABS 0.0  --  0.0 0.0 0.0  BASOSABS 0.0  --  0.0 0.0 0.0    Chemistries  Recent Labs  Lab 05/27/19 0000 05/28/19 0447 05/29/19 0138 05/30/19 0057  NA 133* 139 140 143  K 3.2* 3.1* 3.5 3.3*  CL 89* 94* 96* 99  CO2 30 32 31 32  GLUCOSE 125* 117* 121* 123*  BUN 22 31* 34* 35*  CREATININE 1.25* 0.87 0.56 0.64  CALCIUM 9.0 8.1* 8.6* 8.9  MG  --  1.7 1.9  --   AST 27 27 23 21   ALT 15 14 13 12   ALKPHOS 54 41 42 45  BILITOT 0.8 0.4 0.8 0.5   ------------------------------------------------------------------------------------------------------------------ No results for input(s): CHOL, HDL,  LDLCALC, TRIG, CHOLHDL, LDLDIRECT in the last 72 hours.  No results found for: HGBA1C ------------------------------------------------------------------------------------------------------------------ No results for input(s): TSH, T4TOTAL, T3FREE, THYROIDAB in the last 72 hours.  Invalid input(s): FREET3 ------------------------------------------------------------------------------------------------------------------ Recent Labs    05/29/19 0138 05/30/19 0057  FERRITIN 918* 853*    Coagulation profile No results for input(s): INR, PROTIME in the last 168 hours.  Recent Labs    05/29/19 0138 05/30/19 0057  DDIMER 1.62* 1.32*    Cardiac Enzymes No results for input(s): CKMB, TROPONINI, MYOGLOBIN in the last 168 hours.  Invalid input(s): CK ------------------------------------------------------------------------------------------------------------------ No results found for: BNP  Micro Results Recent Results (from the past 240 hour(s))  Culture, blood (Routine X 2) w Reflex to ID Panel     Status: None (Preliminary result)   Collection Time: 05/27/19 12:00 AM  Specimen: BLOOD  Result Value Ref Range Status   Specimen Description BLOOD LEFT ANTECUBITAL  Final   Special Requests   Final    BOTTLES DRAWN AEROBIC AND ANAEROBIC Blood Culture adequate volume   Culture   Final    NO GROWTH 3 DAYS Performed at [facility], [address] [place], [place] [address]    Report Status PENDING  Incomplete  Culture, blood (Routine X 2) w Reflex to ID Panel     Status: None (Preliminary result)   Collection Time: 05/27/19 12:00 AM   Specimen: BLOOD  Result Value Ref Range Status   Specimen Description BLOOD RIGHT ASSIST CONTROL  Final   Special Requests   Final    BOTTLES DRAWN AEROBIC AND ANAEROBIC Blood Culture adequate volume   Culture   Final    NO GROWTH 3 DAYS Performed at [facility], [address] [place], [place] [address]    Report Status  PENDING  Incomplete  Urine culture     Status: Abnormal   Collection Time: 05/27/19 12:30 AM   Specimen: Urine, Random  Result Value Ref Range Status   Specimen Description   Final    URINE, RANDOM Performed at [facility], [address] [place], [place] [address]    Special Requests   Final    NONE Performed at [facility], [address] [place], [place] [address]    Culture >=100,000 COLONIES/mL ESCHERICHIA COLI (A)  Final   Report Status 05/28/2019 FINAL  Final   Organism ID, Bacteria ESCHERICHIA COLI (A)  Final      Susceptibility   Escherichia coli - MIC*    AMPICILLIN 16 INTERMEDIATE Intermediate     CEFAZOLIN <=4 SENSITIVE Sensitive     CEFTRIAXONE <=0.25 SENSITIVE Sensitive     CIPROFLOXACIN <=0.25 SENSITIVE Sensitive     GENTAMICIN <=1 SENSITIVE Sensitive     IMIPENEM <=0.25 SENSITIVE Sensitive     NITROFURANTOIN <=16 SENSITIVE Sensitive     TRIMETH/SULFA <=20 SENSITIVE Sensitive     AMPICILLIN/SULBACTAM 4 SENSITIVE Sensitive     PIP/TAZO <=4 SENSITIVE Sensitive     * >=100,000 COLONIES/mL ESCHERICHIA COLI  Culture, sputum-assessment     Status: None   Collection Time: 05/27/19  8:05 PM   Specimen: Expectorated Sputum  Result Value Ref Range Status   Specimen Description EXPECTORATED SPUTUM  Final   Special Requests NONE  Final   Sputum evaluation   Final    THIS SPECIMEN IS ACCEPTABLE FOR SPUTUM CULTURE Performed at [facility], [address] [place], [place] [address]    Report Status 05/29/2019 FINAL  Final  Culture, respiratory     Status: None (Preliminary result)   Collection Time: 05/27/19  8:05 PM  Result Value Ref Range Status   Specimen Description   Final    EXPECTORATED SPUTUM Performed at [facility], [address] [place], [place] [address]    Special Requests   Final    NONE Reflexed from [id number] Performed at [facility], [address] [place],  [place] [address]    Gram Stain   Final    FEW WBC PRESENT,BOTH PMN AND MONONUCLEAR FEW YEAST    Culture   Final    CULTURE REINCUBATED FOR BETTER GROWTH Performed at [facility], [address] [place], [place] [address]    Report Status PENDING  Incomplete    Radiology Reports US RENAL  Result Date: 05/28/2019 CLINICAL  DATA:  Acute kidney injury. EXAM: RENAL / URINARY TRACT ULTRASOUND COMPLETE COMPARISON:  None. FINDINGS: Right Kidney: Renal measurements: 10.4 x 4.2 x 4.3 cm = volume: 97.5 mL . Echogenicity within normal limits. No mass or hydronephrosis visualized. Left Kidney: Renal measurements: 10.3 x 5.5 x 5.9 cm = volume: 176 mL. Echogenicity within normal limits. No mass or hydronephrosis visualized. Bladder: Appears normal for degree of bladder distention. Other: None. IMPRESSION: Normal exam. Electronically Signed   By: Francene Boyers M.D.   On: 05/28/2019 09:39   DG CHEST PORT 1 VIEW  Result Date: 05/28/2019 CLINICAL DATA:  Hypoxia EXAM: PORTABLE CHEST 1 VIEW COMPARISON:  05/27/2019, 01/17/2019, 04/26/2017 FINDINGS: Unchanged AP portable examination with heterogeneous airspace opacity of the left lung base and severe underlying emphysema. No new airspace opacity. Numerous fracture and resection deformities of the ribs. The heart and mediastinum are unremarkable. IMPRESSION: Unchanged AP portable examination with heterogeneous airspace opacity of the left lung base, suspicious for infection or aspiration although there is some degree of underlying chronic interstitial scarring. Electronically Signed   By: Lauralyn Primes M.D.   On: 05/28/2019 08:33   DG Chest Port 1 View  Result Date: 05/27/2019 CLINICAL DATA:  79 year old female with respiratory distress. EXAM: PORTABLE CHEST 1 VIEW COMPARISON:  Chest radiograph dated 01/17/2019 and CT dated 11/21/2018. FINDINGS: Emphysema. Bilateral mid to lower lung field interstitial coarsening as well as hazy airspace density primarily involving the  left lung base, likely chronic. Developing infiltrate is less likely. Clinical correlation is recommended. No pleural effusion or pneumothorax. Right upper lobe calcified nodules again noted. Stable cardiac silhouette. Atherosclerotic calcification of the aorta. No acute osseous pathology. IMPRESSION: 1. Left lower lung field density, likely chronic. Developing infiltrate is less likely but not excluded. Clinical correlation is recommended. 2. Advanced emphysema. Electronically Signed   By: Elgie Collard M.D.   On: 05/27/2019 00:41

## 2019-05-30 NOTE — Progress Notes (Signed)
Patient discharged via [facility] on 3L/[place] back to [facility] [address]  All belongings sent with patient. All IV's taken out. Vitals stable. Report given to Essie Hart at the facility.

## 2019-05-30 NOTE — Evaluation (Signed)
Occupational Therapy Evaluation Patient Details Name: Alice Wallace MRN: [id number] DOB: 06/08/1940 Today's Date: 05/30/2019    History of Present Illness 79 y.o. female with PMHx of COPD on 3 L of oxygen, HTN, osteoporosis, deaf/mute-who presented with shortness of breath-she was found to have acute on chronic hypoxemic respiratory failure secondary to COVID-19 pneumonia.    Clinical Impression   PTA, pt was living at [facility] and performed BADLs and used RW for mobility; on 2-3L home O2. Pt currently requiring Min Guard-Min A for LB ADLs and functional mobility with RW. Pt performing oral care at sink with Min Guard and then mobility in her room. SpO2 reading in 70s on 3L O2 with O2 monitor on her finger; elevated O2 to 6L and SpO2 remained in 70s. Used nelcor and SpO2 reading in 90s on 6L O2. Pt not presenting with any dyspnea during session and SpO2 in 90s on 3L at end of session. Pt would benefit from further acute OT to facilitate safe dc. Recommend dc to ALF with HHOT for further OT to optimize safety, independence with ADLs, and return to PLOF.      Follow Up Recommendations  Home health OT;Supervision/Assistance - 24 hour(Return to ALF)    Equipment Recommendations  3 in 1 bedside commode;Other (comment)(Need to confirm DME she already owns)    Recommendations for Other Services PT consult     Precautions / Restrictions Precautions Precautions: Other (comment) Precaution Comments: Deaf Restrictions Weight Bearing Restrictions: No      Mobility Bed Mobility Overal bed mobility: Needs Assistance Bed Mobility: Supine to Sit     Supine to sit: Min assist     General bed mobility comments: Min A to elevate trunk  Transfers Overall transfer level: Needs assistance Equipment used: Rolling walker (2 wheeled) Transfers: Sit to/from Stand Sit to Stand: Min assist         General transfer comment: Min A to power up into standing    Balance Overall balance  assessment: Needs assistance Sitting-balance support: No upper extremity supported;Feet supported Sitting balance-Leahy Scale: Fair     Standing balance support: Bilateral upper extremity supported;No upper extremity supported;During functional activity Standing balance-Leahy Scale: Fair Standing balance comment: Able to maintain static standing during oral care at sink. UE support on RW during mobility                           ADL either performed or assessed with clinical judgement   ADL Overall ADL's : Needs assistance/impaired Eating/Feeding: Set up;Sitting   Grooming: Min guard;Standing;Oral care;Wash/dry hands;Wash/dry Animator Details (indicate cue type and reason): Min Guard A for safety Upper Body Bathing: Set up;Sitting   Lower Body Bathing: Min guard;Sit to/from stand Lower Body Bathing Details (indicate cue type and reason): Min Guard A for safety as pt performed peri care  Upper Body Dressing : Set up;Supervision/safety;Sitting   Lower Body Dressing: Minimal assistance;Sit to/from stand   Toilet Transfer: Min guard;Ambulation;RW;Minimal assistance(simulated to recliner) Toilet Transfer Details (indicate cue type and reason): Min A to power up into standing. Min Guard A for safety in descent Toileting- Architect and Hygiene: Min guard;Sit to/from stand Toileting - Architect Details (indicate cue type and reason): Min Guard A for safety     Functional mobility during ADLs: Min guard;Rolling walker General ADL Comments: Pt performing oral care at sink and functional mobility in room with RW and Min Guard A.     Vision  Perception     Praxis      Pertinent Vitals/Pain Pain Assessment: No/denies pain     Hand Dominance Right   Extremity/Trunk Assessment Upper Extremity Assessment Upper Extremity Assessment: Generalized weakness   Lower Extremity Assessment Lower Extremity Assessment: Defer to PT evaluation    Cervical / Trunk Assessment Cervical / Trunk Assessment: Kyphotic   Communication Communication Communication: Deaf   Cognition Arousal/Alertness: Awake/alert Behavior During Therapy: WFL for tasks assessed/performed Overall Cognitive Status: Within Functional Limits for tasks assessed                                 General Comments: WFL for tasks completed and follow cues via gestures and written   General Comments  SpO2 reading in 70s on 3L with O2 monitor on finger. Use of Nelcor and SpO2 reading in 90s on 3-6L. Poor reading overall. Pt asymptomatic    Exercises     Shoulder Instructions      Home Living Family/patient expects to be discharged to:: Assisted living([facility])                             Home Equipment: Gilford Rile - 2 wheels          Prior Functioning/Environment Level of Independence: Needs assistance  Gait / Transfers Assistance Needed: Uses RW ADL's / Homemaking Assistance Needed: Performs BADLs   Comments: 2-3L home O2        OT Problem List: Decreased strength;Decreased range of motion;Decreased activity tolerance;Impaired balance (sitting and/or standing);Decreased knowledge of use of DME or AE;Decreased knowledge of precautions;Cardiopulmonary status limiting activity      OT Treatment/Interventions: Self-care/ADL training;Therapeutic exercise;Energy conservation;DME and/or AE instruction;Therapeutic activities;Patient/family education    OT Goals(Current goals can be found in the care plan section) Acute Rehab OT Goals Patient Stated Goal: Pt agreeable to mobility OT Goal Formulation: With patient Time For Goal Achievement: 06/13/19 Potential to Achieve Goals: Good  OT Frequency: Min 2X/week   Barriers to D/C:            Co-evaluation PT/OT/SLP Co-Evaluation/Treatment: Yes Reason for Co-Treatment: For patient/therapist safety;To address functional/ADL transfers   OT goals addressed during session: ADL's and  self-care      AM-PAC OT "6 Clicks" Daily Activity     Outcome Measure Help from another person eating meals?: None Help from another person taking care of personal grooming?: A Little Help from another person toileting, which includes using toliet, bedpan, or urinal?: A Little Help from another person bathing (including washing, rinsing, drying)?: A Little Help from another person to put on and taking off regular upper body clothing?: None Help from another person to put on and taking off regular lower body clothing?: A Little 6 Click Score: 20   End of Session Equipment Utilized During Treatment: Oxygen(3-6L) Nurse Communication: Mobility status  Activity Tolerance: Patient tolerated treatment well Patient left: in chair;with call bell/phone within reach;with chair alarm set  OT Visit Diagnosis: Unsteadiness on feet (R26.81);Other abnormalities of gait and mobility (R26.89);Muscle weakness (generalized) (M62.81)                Time: 2683-4196 OT Time Calculation (min): 46 min Charges:  OT General Charges $OT Visit: 1 Visit OT Evaluation $OT Eval Moderate Complexity: 1 Mod OT Treatments $Self Care/Home Management : 8-22 mins  Schuyler, OTR/L Acute Rehab Pager: [phone] Office: [phone]  Lianne Bushy  M Galvin Aversa 05/30/2019, 2:36 PM

## 2019-05-30 NOTE — Plan of Care (Signed)
  Problem: Education: Goal: Knowledge of risk factors and measures for prevention of condition will improve Outcome: Completed/Met   Problem: Coping: Goal: Psychosocial and spiritual needs will be supported Outcome: Completed/Met   Problem: Respiratory: Goal: Will maintain a patent airway Outcome: Completed/Met Goal: Complications related to the disease process, condition or treatment will be avoided or minimized Outcome: Completed/Met   Problem: Education: Goal: Knowledge of General Education information will improve Description: Including pain rating scale, medication(s)/side effects and non-pharmacologic comfort measures Outcome: Completed/Met   Problem: Health Behavior/Discharge Planning: Goal: Ability to manage health-related needs will improve Outcome: Completed/Met   Problem: Clinical Measurements: Goal: Ability to maintain clinical measurements within normal limits will improve Outcome: Completed/Met Goal: Will remain free from infection Outcome: Completed/Met Goal: Diagnostic test results will improve Outcome: Completed/Met Goal: Respiratory complications will improve Outcome: Completed/Met Goal: Cardiovascular complication will be avoided Outcome: Completed/Met   Problem: Activity: Goal: Risk for activity intolerance will decrease Outcome: Completed/Met   Problem: Nutrition: Goal: Adequate nutrition will be maintained Outcome: Completed/Met   Problem: Coping: Goal: Level of anxiety will decrease Outcome: Completed/Met   Problem: Elimination: Goal: Will not experience complications related to bowel motility Outcome: Completed/Met Goal: Will not experience complications related to urinary retention Outcome: Completed/Met   Problem: Pain Managment: Goal: General experience of comfort will improve Outcome: Completed/Met   Problem: Safety: Goal: Ability to remain free from injury will improve Outcome: Completed/Met   Problem: Skin Integrity: Goal:  Risk for impaired skin integrity will decrease Outcome: Completed/Met

## 2019-05-30 NOTE — Evaluation (Signed)
Physical Therapy Evaluation Patient Details Name: Alice Wallace MRN: [id number] DOB: 01-12-1941 Today's Date: 05/30/2019   History of Present Illness  79 y.o. female with PMHx of COPD on 3 L of oxygen, HTN, osteoporosis, deaf/mute-who presented with shortness of breath-she was found to have acute on chronic hypoxemic respiratory failure secondary to COVID-19 pneumonia.   Clinical Impression  Pt is likely close to her baseline level of mobility.  Min to min guard assist for safe mobility with RW around the room.  We used a dry erase board for communication and were able to communicate well that way in combination with manual cues and visual gestures.  She can read, but cannot write.  She is appropriate to return to her ALF and would benefit from therapy follow up there.  PT will continue to follow acutely for safe mobility progression  Follow Up Recommendations Home health PT;Other (comment)(Return to ALF with [facility] therapies PT/OT)    Equipment Recommendations  None recommended by PT    Recommendations for Other Services   NA    Precautions / Restrictions Precautions Precautions: Other (comment) Precaution Comments: Deaf Restrictions Weight Bearing Restrictions: No      Mobility  Bed Mobility Overal bed mobility: Needs Assistance Bed Mobility: Supine to Sit     Supine to sit: Min assist     General bed mobility comments: Min A to elevate trunk  Transfers Overall transfer level: Needs assistance Equipment used: Rolling walker (2 wheeled) Transfers: Sit to/from Stand Sit to Stand: Min assist         General transfer comment: Min A to power up into standing.  Multiple stands for rest breaks during ADL tasks.   Ambulation/Gait Ambulation/Gait assistance: Min guard Gait Distance (Feet): 20 Feet Assistive device: Rolling walker (2 wheeled) Gait Pattern/deviations: Step-through pattern;Shuffle;Trunk flexed Gait velocity: decreased Gait velocity interpretation: 1.31 - 2.62  ft/sec, indicative of limited community ambulator General Gait Details: Pt able to walk to the door and back to her chair on 4-6L O2 [place].  Not getting great readings from her finger or earlobe, so used nellcor and pt stable on 4L (1 L more than her baseline) at 88-90s during mobility.           Balance Overall balance assessment: Needs assistance Sitting-balance support: No upper extremity supported;Feet supported Sitting balance-Leahy Scale: Fair     Standing balance support: Bilateral upper extremity supported;No upper extremity supported;During functional activity Standing balance-Leahy Scale: Fair Standing balance comment: Able to maintain static standing during oral care at sink. UE support on RW during mobility                             Pertinent Vitals/Pain Pain Assessment: No/denies pain    Home Living Family/patient expects to be discharged to:: Assisted living([facility])               Home Equipment: Dan Humphreys - 2 wheels      Prior Function Level of Independence: Needs assistance   Gait / Transfers Assistance Needed: Uses RW  ADL's / Homemaking Assistance Needed: Performs BADLs  Comments: 2-3L home O2     Hand Dominance   Dominant Hand: Right    Extremity/Trunk Assessment   Upper Extremity Assessment Upper Extremity Assessment: Defer to OT evaluation    Lower Extremity Assessment Lower Extremity Assessment: Generalized weakness    Cervical / Trunk Assessment Cervical / Trunk Assessment: Kyphotic  Communication   Communication: Deaf  Cognition  Arousal/Alertness: Awake/alert Behavior During Therapy: WFL for tasks assessed/performed Overall Cognitive Status: Within Functional Limits for tasks assessed                                 General Comments: WFL for tasks completed and follow cues via gestures and written      General Comments General comments (skin integrity, edema, etc.): SpO2 reading in 70s on 3L with O2  monitor on finger. Use of Nelcor and SpO2 reading in 90s on 3-6L. Poor reading overall. Pt asymptomatic        Assessment/Plan    PT Assessment Patient needs continued PT services  PT Problem List Decreased strength;Decreased activity tolerance;Decreased balance;Decreased mobility;Decreased knowledge of use of DME;Decreased knowledge of precautions;Cardiopulmonary status limiting activity       PT Treatment Interventions Gait training;DME instruction;Functional mobility training;Therapeutic activities;Therapeutic exercise;Balance training;Patient/family education    PT Goals (Current goals can be found in the Care Plan section)  Acute Rehab PT Goals Patient Stated Goal: Pt agreeable to mobility PT Goal Formulation: Patient unable to participate in goal setting Time For Goal Achievement: 06/13/19 Potential to Achieve Goals: Good    Frequency Min 3X/week        Co-evaluation PT/OT/SLP Co-Evaluation/Treatment: Yes Reason for Co-Treatment: Complexity of the patient's impairments (multi-system involvement);To address functional/ADL transfers(pt deaf, needed second person to help with communi) PT goals addressed during session: Mobility/safety with mobility;Balance;Proper use of DME;Strengthening/ROM OT goals addressed during session: ADL's and self-care       AM-PAC PT "6 Clicks" Mobility  Outcome Measure Help needed turning from your back to your side while in a flat bed without using bedrails?: A Little Help needed moving from lying on your back to sitting on the side of a flat bed without using bedrails?: A Little Help needed moving to and from a bed to a chair (including a wheelchair)?: A Little Help needed standing up from a chair using your arms (e.g., wheelchair or bedside chair)?: A Little Help needed to walk in hospital room?: A Little Help needed climbing 3-5 steps with a railing? : A Little 6 Click Score: 18    End of Session Equipment Utilized During Treatment:  Oxygen Activity Tolerance: Patient tolerated treatment well Patient left: in chair;with call bell/phone within reach;with chair alarm set Nurse Communication: Mobility status PT Visit Diagnosis: Muscle weakness (generalized) (M62.81);Difficulty in walking, not elsewhere classified (R26.2)    Time: 1610-9604 PT Time Calculation (min) (ACUTE ONLY): 44 min   Charges:       Verdene Lennert, PT, DPT  Acute Rehabilitation [phone] pager #[phone] office  @ [place]: [phone]   PT Evaluation $PT Eval Moderate Complexity: 1 Mod          05/30/2019, 2:58 PM

## 2019-05-30 NOTE — Progress Notes (Deleted)
PATIENT DETAILS Name: Alice Wallace Age: 79 y.o. Sex: female Date of Birth: 01/20/1941 MRN: [id number]. Admitting Physician: Zigmund DanielA Caldwell Powell Jr., MD WUJ:[id number]:Lambert, Kennith Centerracey, NP  Admit Date: 05/27/2019 Discharge date: 05/30/2019  Recommendations for Outpatient Follow-up:  1. Follow up with PCP in 1-2 weeks 2. Please obtain CMP/CBC in one week 3. Repeat Chest Xray in 4-6 week  Admitted From:  ALF   Disposition: ALF with HHPT/OT   Home Health:  Yes  Equipment/Devices:  Resume 3L/M of O2  Discharge Condition: Stable  CODE STATUS: FULL CODE  Diet recommendation:  Diet Order            Diet - low sodium heart healthy        Diet Heart Room service appropriate? Yes; Fluid consistency: Thin  Diet effective now               Brief Summary: See H&P, Labs, Consult and Test reports for all details in brief, Patient is a 79 y.o. female with PMHx of COPD on 3 L of oxygen, HTN, osteoporosis, deaf/mute-who presented with shortness of breath-she was found to have acute on chronic hypoxemic respiratory failure secondary to COVID-19 pneumonia.  She was subsequently admitted to the hospitalist service.  See below for further details.  Brief Hospital Course: Acute on chronic hypoxic Resp Failure due to Covid 19 Viral pneumonia and concurrent bacterial pneumonia:  Much improved-treated with a course of remdesivir which she completed on 1/8, will continue tapering steroids on discharge.  She is back on her usual home regimen of 3 L of oxygen.  She was also treated with Rocephin/Zithromax-[facility]  Please repeat two-view chest x-ray in 4 to 6 weeks to document resolution of pneumonia.  COVID-19 Labs:  Recent Labs    05/28/19 0447 05/29/19 0138 05/30/19 0057  DDIMER 1.52* 1.62* 1.32*  FERRITIN 920* 918* 853*  CRP 26.9* 22.1* 13.5*    No results found for: SARSCOV2NAA   COVID-19 Medications: Steroids: 1/4>> Remdesivir: 1/4>>  1/8  Other medications: Diuretics:Euvolemic-no need for lasix Antibiotics: Rocephin: 1/4>>1/8 Zithromax: 1/4>> 1/8  Melena: Large melanotic stools noted in the ED on admission-Per nursing staff-none noted here.  Stable at 8.9.  Continue PPI.  Since hemoglobin stable-and no further melena-no need for GI/endoscopic evaluation during this hospital stay, can be pursued in the outpatient setting if needed.  E. coli UTI: Given her deaf/mute status-not sure if she is symptomatic-in any event-she was covered with Rocephin (used primarily for PNA)  AKI: Resolved-likely hemodynamically mediated.  Hypokalemia: Replete and recheck.  COPD with chronic hypoxic respiratory failure on 3 L of oxygen: Stable-continue inhalers.  HTN: Controlled-continue Cardizem  RN pressure injury documentation: Pressure Injury 05/27/19 Ischial tuberosity Left Unstageable - Full thickness tissue loss in which the base of the injury is covered by slough (yellow, tan, gray, green or brown) and/or eschar (tan, brown or black) in the wound bed. (Active)  05/27/19 1630  Location: Ischial tuberosity  Location Orientation: Left  Staging: Unstageable - Full thickness tissue loss in which the base of the injury is covered by slough (yellow, tan, gray, green or brown) and/or eschar (tan, brown or black) in the wound bed.  Wound Description (Comments):   Present on Admission: Yes    Procedures/Studies: None  Discharge Diagnoses:  Active Problems:   COVID-19 virus infection   Discharge Instructions:    Person Under Monitoring Name: Alice Wallace  [place] [address] [place] [id number]  Infection Prevention Recommendations for Individuals Confirmed to have, or Being Evaluated for, 2019 Novel Coronavirus (COVID-19) Infection Who Receive Care at Home  Individuals who are confirmed to have, or are being evaluated for, COVID-19 should follow the prevention steps below until a healthcare provider  or local or state health department says they can return to normal activities.  Stay home except to get medical care You should restrict activities outside your home, except for getting medical care. Do not go to work, school, or public areas, and do not use public transportation or taxis.  Call ahead before visiting your doctor Before your medical appointment, call the healthcare provider and tell them that you have, or are being evaluated for, COVID-19 infection. This will help the healthcare providers office take steps to keep other people from getting infected. Ask your healthcare provider to call the local or state health department.  Monitor your symptoms Seek prompt medical attention if your illness is worsening (e.g., difficulty breathing). Before going to your medical appointment, call the healthcare provider and tell them that you have, or are being evaluated for, COVID-19 infection. Ask your healthcare provider to call the local or state health department.  Wear a facemask You should wear a facemask that covers your nose and mouth when you are in the same room with other people and when you visit a healthcare provider. People who live with or visit you should also wear a facemask while they are in the same room with you.  Separate yourself from other people in your home As much as possible, you should stay in a different room from other people in your home. Also, you should use a separate bathroom, if available.  Avoid sharing household items You should not share dishes, drinking glasses, cups, eating utensils, towels, bedding, or other items with other people in your home. After using these items, you should wash them thoroughly with soap and water.  Cover your coughs and sneezes Cover your mouth and nose with a tissue when you cough or sneeze, or you can cough or sneeze into your sleeve. Throw used tissues in a lined trash can, and immediately wash your hands with soap  and water for at least 20 seconds or use an alcohol-based hand rub.  Wash your [employer] your hands often and thoroughly with soap and water for at least 20 seconds. You can use an alcohol-based hand sanitizer if soap and water are not available and if your hands are not visibly dirty. Avoid touching your eyes, nose, and mouth with unwashed hands.   Prevention Steps for Caregivers and Household Members of Individuals Confirmed to have, or Being Evaluated for, COVID-19 Infection Being Cared for in the Home  If you live with, or provide care at home for, a person confirmed to have, or being evaluated for, COVID-19 infection please follow these guidelines to prevent infection:  Follow healthcare providers instructions Make sure that you understand and can help the patient follow any healthcare provider instructions for all care.  Provide for the patients basic needs You should help the patient with basic needs in the home and provide support for getting groceries, prescriptions, and other personal needs.  Monitor the patients symptoms If they are getting sicker, call his or her medical provider and tell them that the patient has, or is being evaluated for, COVID-19 infection. This will help the healthcare providers office take steps to keep other people from getting infected. Ask the healthcare provider to call the local or  state health department.  Limit the number of people who have contact with the patient  If possible, have only one caregiver for the patient.  Other household members should stay in another home or place of residence. If this is not possible, they should stay  in another room, or be separated from the patient as much as possible. Use a separate bathroom, if available.  Restrict visitors who do not have an essential need to be in the home.  Keep older adults, very young children, and other sick people away from the patient Keep older adults, very young  children, and those who have compromised immune systems or chronic health conditions away from the patient. This includes people with chronic heart, lung, or kidney conditions, diabetes, and cancer.  Ensure good ventilation Make sure that shared spaces in the home have good air flow, such as from an air conditioner or an opened window, weather permitting.  Wash your hands often  Wash your hands often and thoroughly with soap and water for at least 20 seconds. You can use an alcohol based hand sanitizer if soap and water are not available and if your hands are not visibly dirty.  Avoid touching your eyes, nose, and mouth with unwashed hands.  Use disposable paper towels to dry your hands. If not available, use dedicated cloth towels and replace them when they become wet.  Wear a facemask and gloves  Wear a disposable facemask at all times in the room and gloves when you touch or have contact with the patients blood, body fluids, and/or secretions or excretions, such as sweat, saliva, sputum, nasal mucus, vomit, urine, or feces.  Ensure the mask fits over your nose and mouth tightly, and do not touch it during use.  Throw out disposable facemasks and gloves after using them. Do not reuse.  Wash your hands immediately after removing your facemask and gloves.  If your personal clothing becomes contaminated, carefully remove clothing and launder. Wash your hands after handling contaminated clothing.  Place all used disposable facemasks, gloves, and other waste in a lined container before disposing them with other household waste.  Remove gloves and wash your hands immediately after handling these items.  Do not share dishes, glasses, or other household items with the patient  Avoid sharing household items. You should not share dishes, drinking glasses, cups, eating utensils, towels, bedding, or other items with a patient who is confirmed to have, or being evaluated for, COVID-19  infection.  After the person uses these items, you should wash them thoroughly with soap and water.  Wash laundry thoroughly  Immediately remove and wash clothes or bedding that have blood, body fluids, and/or secretions or excretions, such as sweat, saliva, sputum, nasal mucus, vomit, urine, or feces, on them.  Wear gloves when handling laundry from the patient.  Read and follow directions on labels of laundry or clothing items and detergent. In general, wash and dry with the warmest temperatures recommended on the label.  Clean all areas the individual has used often  Clean all touchable surfaces, such as counters, tabletops, doorknobs, bathroom fixtures, toilets, phones, keyboards, tablets, and bedside tables, every day. Also, clean any surfaces that may have blood, body fluids, and/or secretions or excretions on them.  Wear gloves when cleaning surfaces the patient has come in contact with.  Use a diluted bleach solution (e.g., dilute bleach with 1 part bleach and 10 parts water) or a household disinfectant with a label that says EPA-registered for coronaviruses. To  make a bleach solution at home, add 1 tablespoon of bleach to 1 quart (4 cups) of water. For a larger supply, add  cup of bleach to 1 gallon (16 cups) of water.  Read labels of cleaning products and follow recommendations provided on product labels. Labels contain instructions for safe and effective use of the cleaning product including precautions you should take when applying the product, such as wearing gloves or eye protection and making sure you have good ventilation during use of the product.  Remove gloves and wash hands immediately after cleaning.  Monitor yourself for signs and symptoms of illness Caregivers and household members are considered close contacts, should monitor their health, and will be asked to limit movement outside of the home to the extent possible. Follow the monitoring steps for close contacts  listed on the symptom monitoring form.   ? If you have additional questions, contact your local health department or call the epidemiologist on call at [phone] (available 24/7). ? This guidance is subject to change. For the most up-to-date guidance from CDC, please refer to their website: [employer].hu    Activity:  As tolerated   Discharge Instructions    Call MD for:  difficulty breathing, headache or visual disturbances   Complete by: As directed    Call MD for:  extreme fatigue   Complete by: As directed    Call MD for:  persistant dizziness or light-headedness   Complete by: As directed    Call MD for:  persistant nausea and vomiting   Complete by: As directed    Diet - low sodium heart healthy   Complete by: As directed    Discharge instructions   Complete by: As directed    Follow with Primary MD  Devoria Glassing, NP in 1-2 weeks  Please get a complete blood count and chemistry panel checked by your Primary MD at your next visit, and again as instructed by your Primary MD.  Get Medicines reviewed and adjusted: Please take all your medications with you for your next visit with your Primary MD  Laboratory/radiological data: Please request your Primary MD to go over all hospital tests and procedure/radiological results at the follow up, please ask your Primary MD to get all Hospital records sent to his/her office.  In some cases, they will be blood work, cultures and biopsy results pending at the time of your discharge. Please request that your primary care M.D. follows up on these results.  Also Note the following: If you experience worsening of your admission symptoms, develop shortness of breath, life threatening emergency, suicidal or homicidal thoughts you must seek medical attention immediately by calling 911 or calling your MD immediately  if symptoms less severe.  You must read complete  instructions/literature along with all the possible adverse reactions/side effects for all the Medicines you take and that have been prescribed to you. Take any new Medicines after you have completely understood and accpet all the possible adverse reactions/side effects.   Do not drive when taking Pain medications or sleeping medications (Benzodaizepines)  Do not take more than prescribed Pain, Sleep and Anxiety Medications. It is not advisable to combine anxiety,sleep and pain medications without talking with your primary care practitioner  Special Instructions: If you have smoked or chewed Tobacco  in the last 2 yrs please stop smoking, stop any regular Alcohol  and or any Recreational drug use.  Wear Seat belts while driving.  Please note: You were cared for by a hospitalist during  your hospital stay. Once you are discharged, your primary care physician will handle any further medical issues. Please note that NO REFILLS for any discharge medications will be authorized once you are discharged, as it is imperative that you return to your primary care physician (or establish a relationship with a primary care physician if you do not have one) for your post hospital discharge needs so that they can reassess your need for medications and monitor your lab values.   Isolation for 3 weeks from 05/27/2019.   Increase activity slowly   Complete by: As directed      Allergies as of 05/30/2019      Reactions   Hydrocodone    Other reaction(s): Unknown   Naproxen    Other reaction(s): Unknown   Other Other (See Comments)   Sulfamethoxazole-trimethoprim    Other reaction(s): Unknown   Tramadol    Other reaction(s): Unknown Pt has tolerated this med 1/5-1/7 with no issues      Medication List    STOP taking these medications   aspirin EC 81 MG tablet   omeprazole 40 MG capsule Commonly known as: PRILOSEC     TAKE these medications   acetaminophen 500 MG tablet Commonly known as:  TYLENOL Take 500 mg by mouth 3 (three) times daily.   acetaminophen 500 MG tablet Commonly known as: TYLENOL Take 500 mg by mouth every 8 (eight) hours as needed for moderate pain or fever.   albuterol 108 (90 Base) MCG/ACT inhaler Commonly known as: VENTOLIN HFA Inhale 2 puffs into the lungs every 4 (four) hours as needed for wheezing or shortness of breath.   albuterol (2.5 MG/3ML) 0.083% nebulizer solution Commonly known as: PROVENTIL Take 2.5 mg by nebulization every 6 (six) hours as needed for wheezing or shortness of breath.   alendronate 70 MG tablet Commonly known as: FOSAMAX Take 70 mg by mouth once a week. Take with a full glass of water on an empty stomach.   aluminum-magnesium hydroxide-simethicone 169-678-93 MG/5ML Susp Commonly known as: MAALOX Take 30 mLs by mouth 4 (four) times daily -  before meals and at bedtime.   Calcium 600-D 600-400 MG-UNIT Tabs Generic drug: Calcium Carbonate-Vitamin D3 Take 1 tablet by mouth 2 (two) times daily.   cefdinir 300 MG capsule Commonly known as: OMNICEF Take 1 capsule (300 mg total) by mouth 2 (two) times daily for 2 days.   cetirizine 10 MG tablet Commonly known as: ZYRTEC Take 10 mg by mouth daily.   diclofenac sodium 1 % Gel Commonly known as: VOLTAREN Apply 2 g topically 3 (three) times daily.   diltiazem 240 MG 24 hr capsule Commonly known as: CARDIZEM CD Take 240 mg by mouth daily.   diphenhydrAMINE 25 MG tablet Commonly known as: BENADRYL Take 25 mg by mouth every 6 (six) hours as needed for allergies.   escitalopram 10 MG tablet Commonly known as: LEXAPRO Take 10 mg by mouth at bedtime.   ferrous sulfate 325 (65 FE) MG tablet Take 325 mg by mouth 2 (two) times daily with a meal.   Fluticasone-Salmeterol 100-50 MCG/DOSE Aepb Commonly known as: ADVAIR Inhale 1 puff into the lungs 2 (two) times daily.   furosemide 20 MG tablet Commonly known as: LASIX Take 20 mg by mouth daily.   guaifenesin 100  MG/5ML syrup Commonly known as: ROBITUSSIN Take 300 mg by mouth 4 (four) times daily as needed for cough.   loperamide 2 MG tablet Commonly known as: IMODIUM A-D Take 2 mg  by mouth daily as needed for diarrhea or loose stools.   metolazone 5 MG tablet Commonly known as: ZAROXOLYN Take 5 mg by mouth daily.   pantoprazole 40 MG tablet Commonly known as: Protonix Take 1 tablet (40 mg total) by mouth daily.   potassium chloride SA 20 MEQ tablet Commonly known as: KLOR-CON Take 2 tablets (40 mEq total) by mouth daily. What changed: how much to take   predniSONE 10 MG tablet Commonly known as: DELTASONE Take 40 mg daily for 1 day, 30 mg daily for 1 day, 20 mg daily for 1 days,10 mg daily for 1 day, then stop   tiZANidine 4 MG tablet Commonly known as: ZANAFLEX Take 4 mg by mouth daily.   traMADol 50 MG tablet Commonly known as: ULTRAM Take 1 tablet (50 mg total) by mouth every 8 (eight) hours for 3 days. What changed: Another medication with the same name was removed. Continue taking this medication, and follow the directions you see here.   Vitamin D 125 MCG (5000 UT) Caps Take 5,000 Units by mouth daily.      Follow-up Information    Devoria Glassing, NP. Schedule an appointment as soon as possible for a visit in 2 week(s).   Specialty: Nurse Practitioner Contact information: [address] [place] [address] [phone]          Allergies  Allergen Reactions   Hydrocodone     Other reaction(s): Unknown   Naproxen     Other reaction(s): Unknown   Other Other (See Comments)   Sulfamethoxazole-Trimethoprim     Other reaction(s): Unknown   Tramadol     Other reaction(s): Unknown Pt has tolerated this med 1/5-1/7 with no issues    Consultations:   None  Other Procedures/Studies: US RENAL  Result Date: 05/28/2019 CLINICAL DATA:  Acute kidney injury. EXAM: RENAL / URINARY TRACT ULTRASOUND COMPLETE COMPARISON:  None. FINDINGS: Right  Kidney: Renal measurements: 10.4 x 4.2 x 4.3 cm = volume: 97.5 mL . Echogenicity within normal limits. No mass or hydronephrosis visualized. Left Kidney: Renal measurements: 10.3 x 5.5 x 5.9 cm = volume: 176 mL. Echogenicity within normal limits. No mass or hydronephrosis visualized. Bladder: Appears normal for degree of bladder distention. Other: None. IMPRESSION: Normal exam. Electronically Signed   By: Francene Boyers M.D.   On: 05/28/2019 09:39   DG CHEST PORT 1 VIEW  Result Date: 05/28/2019 CLINICAL DATA:  Hypoxia EXAM: PORTABLE CHEST 1 VIEW COMPARISON:  05/27/2019, 01/17/2019, 04/26/2017 FINDINGS: Unchanged AP portable examination with heterogeneous airspace opacity of the left lung base and severe underlying emphysema. No new airspace opacity. Numerous fracture and resection deformities of the ribs. The heart and mediastinum are unremarkable. IMPRESSION: Unchanged AP portable examination with heterogeneous airspace opacity of the left lung base, suspicious for infection or aspiration although there is some degree of underlying chronic interstitial scarring. Electronically Signed   By: Lauralyn Primes M.D.   On: 05/28/2019 08:33   DG Chest Port 1 View  Result Date: 05/27/2019 CLINICAL DATA:  79 year old female with respiratory distress. EXAM: PORTABLE CHEST 1 VIEW COMPARISON:  Chest radiograph dated 01/17/2019 and CT dated 11/21/2018. FINDINGS: Emphysema. Bilateral mid to lower lung field interstitial coarsening as well as hazy airspace density primarily involving the left lung base, likely chronic. Developing infiltrate is less likely. Clinical correlation is recommended. No pleural effusion or pneumothorax. Right upper lobe calcified nodules again noted. Stable cardiac silhouette. Atherosclerotic calcification of the aorta. No acute osseous pathology. IMPRESSION:  1. Left lower lung field density, likely chronic. Developing infiltrate is less likely but not excluded. Clinical correlation is recommended. 2.  Advanced emphysema. Electronically Signed   By: Elgie Collard M.D.   On: 05/27/2019 00:41     TODAY-DAY OF DISCHARGE:  Subjective:   Alice Wallace today has no headache,no chest abdominal pain,no new weakness tingling or numbness,  Objective:   Blood pressure (!) 137/58, pulse 82, temperature 98 F (36.7 C), temperature source Oral, resp. rate 17, height 5\' 3"  (1.6 m), weight 59 kg, SpO2 93 %.  Intake/Output Summary (Last 24 hours) at 05/30/2019 1457 Last data filed at 05/30/2019 0535 Gross per 24 hour  Intake 640 ml  Output 1200 ml  Net -560 ml   Filed Weights   05/27/19 1630  Weight: 59 kg    Exam: Awake Alert [place].AT,PERRAL Supple Neck,No JVD, No cervical lymphadenopathy appriciated.  Symmetrical Chest wall movement, Good air movement bilaterally, CTAB RRR,No Gallops,Rubs or new Murmurs, No Parasternal Heave +ve B.Sounds, Abd Soft, Non tender, No organomegaly appriciated, No rebound -guarding or rigidity. No Cyanosis, Clubbing or edema, No new Rash or bruise   PERTINENT RADIOLOGIC STUDIES: 07/25/19 RENAL  Result Date: 05/28/2019 CLINICAL DATA:  Acute kidney injury. EXAM: RENAL / URINARY TRACT ULTRASOUND COMPLETE COMPARISON:  None. FINDINGS: Right Kidney: Renal measurements: 10.4 x 4.2 x 4.3 cm = volume: 97.5 mL . Echogenicity within normal limits. No mass or hydronephrosis visualized. Left Kidney: Renal measurements: 10.3 x 5.5 x 5.9 cm = volume: 176 mL. Echogenicity within normal limits. No mass or hydronephrosis visualized. Bladder: Appears normal for degree of bladder distention. Other: None. IMPRESSION: Normal exam. Electronically Signed   By: 07/26/2019 M.D.   On: 05/28/2019 09:39   DG CHEST PORT 1 VIEW  Result Date: 05/28/2019 CLINICAL DATA:  Hypoxia EXAM: PORTABLE CHEST 1 VIEW COMPARISON:  05/27/2019, 01/17/2019, 04/26/2017 FINDINGS: Unchanged AP portable examination with heterogeneous airspace opacity of the left lung base and severe underlying emphysema. No new  airspace opacity. Numerous fracture and resection deformities of the ribs. The heart and mediastinum are unremarkable. IMPRESSION: Unchanged AP portable examination with heterogeneous airspace opacity of the left lung base, suspicious for infection or aspiration although there is some degree of underlying chronic interstitial scarring. Electronically Signed   By: 14/10/2016 M.D.   On: 05/28/2019 08:33   DG Chest Port 1 View  Result Date: 05/27/2019 CLINICAL DATA:  79 year old female with respiratory distress. EXAM: PORTABLE CHEST 1 VIEW COMPARISON:  Chest radiograph dated 01/17/2019 and CT dated 11/21/2018. FINDINGS: Emphysema. Bilateral mid to lower lung field interstitial coarsening as well as hazy airspace density primarily involving the left lung base, likely chronic. Developing infiltrate is less likely. Clinical correlation is recommended. No pleural effusion or pneumothorax. Right upper lobe calcified nodules again noted. Stable cardiac silhouette. Atherosclerotic calcification of the aorta. No acute osseous pathology. IMPRESSION: 1. Left lower lung field density, likely chronic. Developing infiltrate is less likely but not excluded. Clinical correlation is recommended. 2. Advanced emphysema. Electronically Signed   By: 01/22/2019 M.D.   On: 05/27/2019 00:41     PERTINENT LAB RESULTS: CBC: Recent Labs    05/29/19 0138 05/30/19 0057  WBC 9.8 7.2  HGB 8.8* 8.9*  HCT 28.0* 27.9*  PLT 184 232   CMET CMP     Component Value Date/Time   NA 143 05/30/2019 0057   K 3.3 (L) 05/30/2019 0057   CL 99 05/30/2019 0057   CO2 32 05/30/2019 0057  GLUCOSE 123 (H) 05/30/2019 0057   BUN 35 (H) 05/30/2019 0057   CREATININE 0.64 05/30/2019 0057   CALCIUM 8.9 05/30/2019 0057   PROT 6.0 (L) 05/30/2019 0057   ALBUMIN 3.0 (L) 05/30/2019 0057   AST 21 05/30/2019 0057   ALT 12 05/30/2019 0057   ALKPHOS 45 05/30/2019 0057   BILITOT 0.5 05/30/2019 0057   GFRNONAA >60 05/30/2019 0057   GFRAA  >60 05/30/2019 0057    GFR Estimated Creatinine Clearance: 47.9 mL/min (by C-G formula based on SCr of 0.64 mg/dL). No results for input(s): LIPASE, AMYLASE in the last 72 hours. No results for input(s): CKTOTAL, CKMB, CKMBINDEX, TROPONINI in the last 72 hours. Invalid input(s): POCBNP Recent Labs    05/29/19 0138 05/30/19 0057  DDIMER 1.62* 1.32*   No results for input(s): HGBA1C in the last 72 hours. No results for input(s): CHOL, HDL, LDLCALC, TRIG, CHOLHDL, LDLDIRECT in the last 72 hours. No results for input(s): TSH, T4TOTAL, T3FREE, THYROIDAB in the last 72 hours.  Invalid input(s): FREET3 Recent Labs    05/29/19 0138 05/30/19 0057  FERRITIN 918* 853*   Coags: No results for input(s): INR in the last 72 hours.  Invalid input(s): PT Microbiology: Recent Results (from the past 240 hour(s))  Culture, blood (Routine X 2) w Reflex to ID Panel     Status: None (Preliminary result)   Collection Time: 05/27/19 12:00 AM   Specimen: BLOOD  Result Value Ref Range Status   Specimen Description BLOOD LEFT ANTECUBITAL  Final   Special Requests   Final    BOTTLES DRAWN AEROBIC AND ANAEROBIC Blood Culture adequate volume   Culture   Final    NO GROWTH 3 DAYS Performed at [facility], [address] [place], [place] [address]    Report Status PENDING  Incomplete  Culture, blood (Routine X 2) w Reflex to ID Panel     Status: None (Preliminary result)   Collection Time: 05/27/19 12:00 AM   Specimen: BLOOD  Result Value Ref Range Status   Specimen Description BLOOD RIGHT ASSIST CONTROL  Final   Special Requests   Final    BOTTLES DRAWN AEROBIC AND ANAEROBIC Blood Culture adequate volume   Culture   Final    NO GROWTH 3 DAYS Performed at [facility], [address], [place], [place] [address]    Report Status PENDING  Incomplete  Urine culture     Status: Abnormal   Collection Time: 05/27/19 12:30 AM   Specimen: Urine, Random  Result Value Ref Range  Status   Specimen Description   Final    URINE, RANDOM Performed at [facility], [address], [place], [place] [address]    Special Requests   Final    NONE Performed at [facility], [address] [place], [place] [address]    Culture >=100,000 COLONIES/mL ESCHERICHIA COLI (A)  Final   Report Status 05/28/2019 FINAL  Final   Organism ID, Bacteria ESCHERICHIA COLI (A)  Final      Susceptibility   Escherichia coli - MIC*    AMPICILLIN 16 INTERMEDIATE Intermediate     CEFAZOLIN <=4 SENSITIVE Sensitive     CEFTRIAXONE <=0.25 SENSITIVE Sensitive     CIPROFLOXACIN <=0.25 SENSITIVE Sensitive     GENTAMICIN <=1 SENSITIVE Sensitive     IMIPENEM <=0.25 SENSITIVE Sensitive     NITROFURANTOIN <=16 SENSITIVE Sensitive     TRIMETH/SULFA <=20 SENSITIVE Sensitive     AMPICILLIN/SULBACTAM 4 SENSITIVE Sensitive  PIP/TAZO <=4 SENSITIVE Sensitive     * >=100,000 COLONIES/mL ESCHERICHIA COLI  Culture, sputum-assessment     Status: None   Collection Time: 05/27/19  8:05 PM   Specimen: Expectorated Sputum  Result Value Ref Range Status   Specimen Description EXPECTORATED SPUTUM  Final   Special Requests NONE  Final   Sputum evaluation   Final    THIS SPECIMEN IS ACCEPTABLE FOR SPUTUM CULTURE Performed at [facility], [address], [place], [place] [address]    Report Status 05/29/2019 FINAL  Final  Culture, respiratory     Status: None (Preliminary result)   Collection Time: 05/27/19  8:05 PM  Result Value Ref Range Status   Specimen Description   Final    EXPECTORATED SPUTUM Performed at [facility], [address], [place], [place] [address]    Special Requests   Final    NONE Reflexed from [id number] Performed at [facility], [address], [place], [place] [address]    Gram Stain   Final    FEW WBC PRESENT,BOTH PMN AND MONONUCLEAR FEW YEAST    Culture   Final    CULTURE REINCUBATED FOR BETTER  GROWTH Performed at [facility], [address] [place], [place] [address]    Report Status PENDING  Incomplete    FURTHER DISCHARGE INSTRUCTIONS:  Get Medicines reviewed and adjusted: Please take all your medications with you for your next visit with your Primary MD  Laboratory/radiological data: Please request your Primary MD to go over all hospital tests and procedure/radiological results at the follow up, please ask your Primary MD to get all Hospital records sent to his/her office.  In some cases, they will be blood work, cultures and biopsy results pending at the time of your discharge. Please request that your primary care M.D. goes through all the records of your hospital data and follows up on these results.  Also Note the following: If you experience worsening of your admission symptoms, develop shortness of breath, life threatening emergency, suicidal or homicidal thoughts you must seek medical attention immediately by calling 911 or calling your MD immediately  if symptoms less severe.  You must read complete instructions/literature along with all the possible adverse reactions/side effects for all the Medicines you take and that have been prescribed to you. Take any new Medicines after you have completely understood and accpet all the possible adverse reactions/side effects.   Do not drive when taking Pain medications or sleeping medications (Benzodaizepines)  Do not take more than prescribed Pain, Sleep and Anxiety Medications. It is not advisable to combine anxiety,sleep and pain medications without talking with your primary care practitioner  Special Instructions: If you have smoked or chewed Tobacco  in the last 2 yrs please stop smoking, stop any regular Alcohol  and or any Recreational drug use.  Wear Seat belts while driving.  Please note: You were cared for by a hospitalist during your hospital stay. Once you are discharged, your primary care physician will  handle any further medical issues. Please note that NO REFILLS for any discharge medications will be authorized once you are discharged, as it is imperative that you return to your primary care physician (or establish a relationship with a primary care physician if you do not have one) for your post hospital discharge needs so that they can reassess your need for medications and monitor your lab values.  Total Time spent coordinating discharge including counseling, education and  face to face time equals 35 minutes.  SignedJeoffrey Massed: Alice Wallace 05/30/2019 2:57 PM

## 2019-05-31 NOTE — Discharge Summary (Signed)
° °PATIENT DETAILS °Name: Alice Wallace °Age: 79 y.o. °Sex: female °Date of Birth: 09/23/1940 °MRN: [id number]. °Admitting Physician: A Caldwell Powell Jr., MD °PCP:Lambert, Tracey, NP ° °Admit Date: 05/27/2019 °Discharge date: 05/30/2019 ° °Recommendations for Outpatient Follow-up:  °1. Follow up with PCP in 1-2 weeks °2. Please obtain CMP/CBC in one week °3. Repeat Chest Xray in 4-6 week ° °Admitted From:  °ALF  ° °Disposition: °ALF with HHPT/OT ° ° Home Health: ° Yes ° °Equipment/Devices: ° Resume 3L/M of O2 ° °Discharge Condition: °Stable ° °CODE STATUS: °FULL CODE ° °Diet recommendation:  °Diet Order   °       °  Diet - low sodium heart healthy    °  °  °  °  °  ° °Brief Summary: °See H&P, Labs, Consult and Test reports for all details in brief, Patient is a 79 y.o. female with PMHx of COPD on 3 L of oxygen, HTN, osteoporosis, deaf/mute-who presented with shortness of breath-she was found to have acute on chronic hypoxemic respiratory failure secondary to COVID-19 pneumonia.  She was subsequently admitted to the hospitalist service.  See below for further details. ° °Brief Hospital Course: °Acute on chronic hypoxic Resp Failure due to Covid 19 Viral pneumonia and concurrent bacterial pneumonia:  Much improved-treated with a course of remdesivir which she completed on 1/8, will continue tapering steroids on discharge.  She is back on her usual home regimen of 3 L of oxygen.  She was also treated with Rocephin/Zithromax-we will transition to Omnicef for a few more days.  Please repeat two-view chest x-ray in 4 to 6 weeks to document resolution of pneumonia. ° °COVID-19 Labs: ° °Recent Labs  °  05/29/19 °0138 05/30/19 °0057  °DDIMER 1.62* 1.32*  °FERRITIN 918* 853*  °CRP 22.1* 13.5*  ° ° °No results found for: SARSCOV2NAA  ° °COVID-19 Medications: °Steroids: 1/4>> °Remdesivir: 1/4>> 1/8 °  °Other medications: °Diuretics:Euvolemic-no need for lasix °Antibiotics: °Rocephin: 1/4>>1/8 °Zithromax: 1/4>> 1/8 ° °Melena: Large  melanotic stools noted in the ED on admission-Per nursing staff-none noted here.  Stable at 8.9.  Continue PPI.  Since hemoglobin stable-and no further melena-no need for GI/endoscopic evaluation during this hospital stay, can be pursued in the outpatient setting if needed. °  °E. coli UTI: Given her deaf/mute status-not sure if she is symptomatic-in any event-she was covered with Rocephin (used primarily for PNA) °  °AKI: Resolved-likely hemodynamically mediated. °  °Hypokalemia: Replete and recheck. °  °COPD with chronic hypoxic respiratory failure on 3 L of oxygen: Stable-continue inhalers. °  °HTN: Controlled-continue Cardizem ° °RN pressure injury documentation: °Pressure Injury 05/27/19 Ischial tuberosity Left Unstageable - Full thickness tissue loss in which the base of the injury is covered by slough (yellow, tan, gray, green or brown) and/or eschar (tan, brown or black) in the wound bed. (Active)  °05/27/19 1630  °Location: Ischial tuberosity  °Location Orientation: Left  °Staging: Unstageable - Full thickness tissue loss in which the base of the injury is covered by slough (yellow, tan, gray, green or brown) and/or eschar (tan, brown or black) in the wound bed.  °Wound Description (Comments):   °Present on Admission: Yes  ° ° °Procedures/Studies: °None ° °Discharge Diagnoses:  °Active Problems: °  COVID-19 virus infection ° ° °Discharge Instructions: ° ° ° °Person Under Monitoring Name: Alice Wallace ° °Location: [address] °[place] [address] ° ° °Infection Prevention Recommendations for Individuals Confirmed to have, °or Being Evaluated for, 2019 Novel Coronavirus (COVID-19) Infection Who °Receive Care at Home ° °Individuals   who are confirmed to have, or are being evaluated for, COVID-19 should follow the prevention steps below °until a healthcare provider or local or state health department says they can return to normal activities. ° °Stay home except to get medical care °You should  restrict activities outside your home, except for getting medical care. Do not go to work, school, or public °areas, and do not use public transportation or taxis. ° °Call ahead before visiting your doctor °Before your medical appointment, call the healthcare provider and tell them that you have, or are being evaluated for, °COVID-19 infection. This will help the healthcare provider’s office take steps to keep other people from getting infected. °Ask your healthcare provider to call the local or state health department. ° °Monitor your symptoms °Seek prompt medical attention if your illness is worsening (e.g., difficulty breathing). Before going to your medical °appointment, call the healthcare provider and tell them that you have, or are being evaluated for, COVID-19 infection. Ask °your healthcare provider to call the local or state health department. ° °Wear a facemask °You should wear a facemask that covers your nose and mouth when you are in the same room with other people and °when you visit a healthcare provider. People who live with or visit you should also wear a facemask while they are in the °same room with you. ° °Separate yourself from other people in your home °As much as possible, you should stay in a different room from other people in your home. Also, you should use a separate °bathroom, if available. ° °Avoid sharing household items °You should not share dishes, drinking glasses, cups, eating utensils, towels, bedding, or other items with other people in °your home. After using these items, you should wash them thoroughly with soap and water. ° °Cover your coughs and sneezes °Cover your mouth and nose with a tissue when you cough or sneeze, or you can cough or sneeze into your sleeve. Throw °used tissues in a lined trash can, and immediately wash your hands with soap and water for at least 20 seconds or use an °alcohol-based hand rub. ° °Wash your hands °Wash your hands often and thoroughly with  soap and water for at least 20 seconds. You can use an alcohol-based hand °sanitizer if soap and water are not available and if your hands are not visibly dirty. Avoid touching your eyes, nose, and °mouth with unwashed hands. ° ° °Prevention Steps for Caregivers and Household Members of °Individuals Confirmed to have, or Being Evaluated for, COVID-19 Infection Being Cared for in the Home ° °If you live with, or provide care at home for, a person confirmed to have, or being evaluated for, COVID-19 infection °please follow these guidelines to prevent infection: ° °Follow healthcare provider’s instructions °Make sure that you understand and can help the patient follow any healthcare provider instructions for all care. ° °Provide for the patient’s basic needs °You should help the patient with basic needs in the home and provide support for getting groceries, prescriptions, and °other personal needs. ° °Monitor the patient’s symptoms °If they are getting sicker, call his or her medical provider and tell them that the patient has, or is being evaluated for, °COVID-19 infection. This will help the healthcare provider’s office take steps to keep other people from getting infected. °Ask the healthcare provider to call the local or state health department. ° °Limit the number of people who have contact with the patient °· If possible, have only one caregiver for the   patient. °· Other household members should stay in another home or place of residence. If this is not possible, they should stay °· in another room, or be separated from the patient as much as possible. Use a separate bathroom, if available. °· Restrict visitors who do not have an essential need to be in the home. ° °Keep older adults, very young children, and other sick people away from the patient °Keep older adults, very young children, and those who have compromised immune systems or chronic health conditions away from the patient. This includes people with  chronic heart, lung, or kidney conditions, diabetes, and cancer. ° °Ensure good ventilation °Make sure that shared spaces in the home have good air flow, such as from an air conditioner or an opened window, °weather permitting. ° °Wash your hands often °· Wash your hands often and thoroughly with soap and water for at least 20 seconds. You can use an alcohol based hand sanitizer if soap and water are not available and if your hands are not visibly dirty. °· Avoid touching your eyes, nose, and mouth with unwashed hands. °· Use disposable paper towels to dry your hands. If not available, use dedicated cloth towels and replace them when they become wet. ° °Wear a facemask and gloves °· Wear a disposable facemask at all times in the room and gloves when you touch or have contact with the patient’s blood, body fluids, and/or secretions or excretions, such as sweat, saliva, sputum, nasal mucus, vomit, urine, or feces.  Ensure the mask fits over your nose and mouth tightly, and do not touch it during use. °· Throw out disposable facemasks and gloves after using them. Do not reuse. °· Wash your hands immediately after removing your facemask and gloves. °· If your personal clothing becomes contaminated, carefully remove clothing and launder. Wash your hands after handling contaminated clothing. °· Place all used disposable facemasks, gloves, and other waste in a lined container before disposing them with other household waste. °· Remove gloves and wash your hands immediately after handling these items. ° °Do not share dishes, glasses, or other household items with the patient °· Avoid sharing household items. You should not share dishes, drinking glasses, cups, eating utensils, towels, bedding, or other items with a patient who is confirmed to have, or being evaluated for, COVID-19 infection. °· After the person uses these items, you should wash them thoroughly with soap and water. ° °Wash laundry thoroughly °· Immediately  remove and wash clothes or bedding that have blood, body fluids, and/or secretions or excretions, such as sweat, saliva, sputum, nasal mucus, vomit, urine, or feces, on them. °· Wear gloves when handling laundry from the patient. °· Read and follow directions on labels of laundry or clothing items and detergent. In general, wash and dry with the warmest temperatures recommended on the label. ° °Clean all areas the individual has used often °· Clean all touchable surfaces, such as counters, tabletops, doorknobs, bathroom fixtures, toilets, phones, keyboards, tablets, and bedside tables, every day. Also, clean any surfaces that may have blood, body fluids, and/or secretions or excretions on them. °· Wear gloves when cleaning surfaces the patient has come in contact with. °· Use a diluted bleach solution (e.g., dilute bleach with 1 part bleach and 10 parts water) or a household disinfectant with a label that says EPA-registered for coronaviruses. To make a bleach solution at home, add 1 tablespoon of bleach to 1 quart (4 cups) of water. For a larger supply, add ¼   cup of bleach to 1 gallon (16 cups) of water. °· Read labels of cleaning products and follow recommendations provided on product labels. Labels contain instructions for safe and effective use of the cleaning product including precautions you should take when applying the product, such as wearing gloves or eye protection and making sure you have good ventilation during use of the product. °· Remove gloves and wash hands immediately after cleaning. ° °Monitor yourself for signs and symptoms of illness °Caregivers and household members are considered close contacts, should monitor their health, and will be asked to limit °movement outside of the home to the extent possible. Follow the monitoring steps for close contacts listed on the °symptom monitoring form. ° ° °? If you have additional questions, contact your local health department or call the epidemiologist  on call at [phone] °(available 24/7). °? This guidance is subject to change. For the most up-to-date guidance from CDC, please refer to their website: °https://www.cdc.gov/coronavirus/2019-ncov/hcp/guidance-prevent-spread.html  ° ° °Activity:  °As tolerated  ° °Discharge Instructions   ° Call MD for:  difficulty breathing, headache or visual disturbances   Complete by: As directed °  ° Call MD for:  extreme fatigue   Complete by: As directed °  ° Call MD for:  persistant dizziness or light-headedness   Complete by: As directed °  ° Call MD for:  persistant nausea and vomiting   Complete by: As directed °  ° Diet - low sodium heart healthy   Complete by: As directed °  ° Discharge instructions   Complete by: As directed °  ° Follow with Primary MD  Lambert, Tracey, NP in 1-2 weeks ° °Please get a complete blood count and chemistry panel checked by your Primary MD at your next visit, and again as instructed by your Primary MD. ° °Get Medicines reviewed and adjusted: °Please take all your medications with you for your next visit with your Primary MD ° °Laboratory/radiological data: °Please request your Primary MD to go over all hospital tests and procedure/radiological results at the follow up, please ask your Primary MD to get all Hospital records sent to his/her office. ° °In some cases, they will be blood work, cultures and biopsy results pending at the time of your discharge. Please request that your primary care M.D. follows up on these results. ° °Also Note the following: °If you experience worsening of your admission symptoms, develop shortness of breath, life threatening emergency, suicidal or homicidal thoughts you must seek medical attention immediately by calling 911 or calling your MD immediately  if symptoms less severe. ° °You must read complete instructions/literature along with all the possible adverse reactions/side effects for all the Medicines you take and that have been prescribed to you. Take  any new Medicines after you have completely understood and accpet all the possible adverse reactions/side effects.  ° °Do not drive when taking Pain medications or sleeping medications (Benzodaizepines) ° °Do not take more than prescribed Pain, Sleep and Anxiety Medications. It is not advisable to combine anxiety,sleep and pain medications without talking with your primary care practitioner ° °Special Instructions: If you have smoked or chewed Tobacco  in the last 2 yrs please stop smoking, stop any regular Alcohol  and or any Recreational drug use. ° °Wear Seat belts while driving. ° °Please note: °You were cared for by a hospitalist during your hospital stay. Once you are discharged, your primary care physician will handle any further medical issues. Please note that NO REFILLS for any   discharge medications will be authorized once you are discharged, as it is imperative that you return to your primary care physician (or establish a relationship with a primary care physician if you do not have one) for your post hospital discharge needs so that they can reassess your need for medications and monitor your lab values.  ° Isolation for 3 weeks from 05/27/2019.  ° Increase activity slowly   Complete by: As directed °  °  ° °Allergies as of 05/30/2019   °   Reactions  ° Hydrocodone   ° Other reaction(s): Unknown  ° Naproxen   ° Other reaction(s): Unknown  ° Other Other (See Comments)  ° Sulfamethoxazole-trimethoprim   ° Other reaction(s): Unknown  ° Tramadol   ° Other reaction(s): Unknown °Pt has tolerated this med 1/5-1/7 with no issues  °  °  °Medication List  °  °STOP taking these medications   °aspirin EC 81 MG tablet °  °omeprazole 40 MG capsule °Commonly known as: PRILOSEC °  °  °TAKE these medications   °acetaminophen 500 MG tablet °Commonly known as: TYLENOL °Take 500 mg by mouth 3 (three) times daily. °  °acetaminophen 500 MG tablet °Commonly known as: TYLENOL °Take 500 mg by mouth every 8 (eight) hours as needed  for moderate pain or fever. °  °albuterol 108 (90 Base) MCG/ACT inhaler °Commonly known as: VENTOLIN HFA °Inhale 2 puffs into the lungs every 4 (four) hours as needed for wheezing or shortness of breath. °  °albuterol (2.5 MG/3ML) 0.083% nebulizer solution °Commonly known as: PROVENTIL °Take 2.5 mg by nebulization every 6 (six) hours as needed for wheezing or shortness of breath. °  °alendronate 70 MG tablet °Commonly known as: FOSAMAX °Take 70 mg by mouth once a week. Take with a full glass of water on an empty stomach. °  °aluminum-magnesium hydroxide-simethicone 200-200-20 MG/5ML Susp °Commonly known as: MAALOX °Take 30 mLs by mouth 4 (four) times daily -  before meals and at bedtime. °  °Calcium 600-D 600-400 MG-UNIT Tabs °Generic drug: Calcium Carbonate-Vitamin D3 °Take 1 tablet by mouth 2 (two) times daily. °  °cefdinir 300 MG capsule °Commonly known as: OMNICEF °Take 1 capsule (300 mg total) by mouth 2 (two) times daily for 2 days. °  °cetirizine 10 MG tablet °Commonly known as: ZYRTEC °Take 10 mg by mouth daily. °  °diclofenac sodium 1 % Gel °Commonly known as: VOLTAREN °Apply 2 g topically 3 (three) times daily. °  °diltiazem 240 MG 24 hr capsule °Commonly known as: CARDIZEM CD °Take 240 mg by mouth daily. °  °diphenhydrAMINE 25 MG tablet °Commonly known as: BENADRYL °Take 25 mg by mouth every 6 (six) hours as needed for allergies. °  °escitalopram 10 MG tablet °Commonly known as: LEXAPRO °Take 10 mg by mouth at bedtime. °  °ferrous sulfate 325 (65 FE) MG tablet °Take 325 mg by mouth 2 (two) times daily with a meal. °  °Fluticasone-Salmeterol 100-50 MCG/DOSE Aepb °Commonly known as: ADVAIR °Inhale 1 puff into the lungs 2 (two) times daily. °  °furosemide 20 MG tablet °Commonly known as: LASIX °Take 20 mg by mouth daily. °  °guaifenesin 100 MG/5ML syrup °Commonly known as: ROBITUSSIN °Take 300 mg by mouth 4 (four) times daily as needed for cough. °  °loperamide 2 MG tablet °Commonly known as: IMODIUM  A-D °Take 2 mg by mouth daily as needed for diarrhea or loose stools. °  °metolazone 5 MG tablet °Commonly known as: ZAROXOLYN °Take 5 mg by   mouth daily. °  °pantoprazole 40 MG tablet °Commonly known as: Protonix °Take 1 tablet (40 mg total) by mouth daily. °  °potassium chloride SA 20 MEQ tablet °Commonly known as: KLOR-CON °Take 2 tablets (40 mEq total) by mouth daily. °What changed: how much to take °  °predniSONE 10 MG tablet °Commonly known as: DELTASONE °Take 40 mg daily for 1 day, 30 mg daily for 1 day, 20 mg daily for 1 days,10 mg daily for 1 day, then stop °  °tiZANidine 4 MG tablet °Commonly known as: ZANAFLEX °Take 4 mg by mouth daily. °  °traMADol 50 MG tablet °Commonly known as: ULTRAM °Take 1 tablet (50 mg total) by mouth every 8 (eight) hours for 3 days. °What changed: Another medication with the same name was removed. Continue taking this medication, and follow the directions you see here. °  °Vitamin D 125 MCG (5000 UT) Caps °Take 5,000 Units by mouth daily. °  °  ° °Follow-up Information   ° Lambert, Tracey, NP. Schedule an appointment as soon as possible for a visit in 2 week(s).   °Specialty: Nurse Practitioner °Contact information: °[address] °[address] °[place] [address] °[phone] ° ° °  °  °  ° °Allergies  °Allergen Reactions  °• Hydrocodone   °  Other reaction(s): Unknown  °• Naproxen   °  Other reaction(s): Unknown  °• Other Other (See Comments)  °• Sulfamethoxazole-Trimethoprim   °  Other reaction(s): Unknown  °• Tramadol   °  Other reaction(s): Unknown °Pt has tolerated this med 1/5-1/7 with no issues  ° ° °Consultations: °·  None ° °Other Procedures/Studies: °US RENAL ° °Result Date: 05/28/2019 °CLINICAL DATA:  Acute kidney injury. EXAM: RENAL / URINARY TRACT ULTRASOUND COMPLETE COMPARISON:  None. FINDINGS: Right Kidney: Renal measurements: 10.4 x 4.2 x 4.3 cm = volume: 97.5 mL . Echogenicity within normal limits. No mass or hydronephrosis visualized. Left Kidney: Renal  measurements: 10.3 x 5.5 x 5.9 cm = volume: 176 mL. Echogenicity within normal limits. No mass or hydronephrosis visualized. Bladder: Appears normal for degree of bladder distention. Other: None. IMPRESSION: Normal exam. Electronically Signed   By: James  Maxwell M.D.   On: 05/28/2019 09:39  ° °DG CHEST PORT 1 VIEW ° °Result Date: 05/28/2019 °CLINICAL DATA:  Hypoxia EXAM: PORTABLE CHEST 1 VIEW COMPARISON:  05/27/2019, 01/17/2019, 04/26/2017 FINDINGS: Unchanged AP portable examination with heterogeneous airspace opacity of the left lung base and severe underlying emphysema. No new airspace opacity. Numerous fracture and resection deformities of the ribs. The heart and mediastinum are unremarkable. IMPRESSION: Unchanged AP portable examination with heterogeneous airspace opacity of the left lung base, suspicious for infection or aspiration although there is some degree of underlying chronic interstitial scarring. Electronically Signed   By: Alex  Bibbey M.D.   On: 05/28/2019 08:33  ° °DG Chest Port 1 View ° °Result Date: 05/27/2019 °CLINICAL DATA:  78-year-old female with respiratory distress. EXAM: PORTABLE CHEST 1 VIEW COMPARISON:  Chest radiograph dated 01/17/2019 and CT dated 11/21/2018. FINDINGS: Emphysema. Bilateral mid to lower lung field interstitial coarsening as well as hazy airspace density primarily involving the left lung base, likely chronic. Developing infiltrate is less likely. Clinical correlation is recommended. No pleural effusion or pneumothorax. Right upper lobe calcified nodules again noted. Stable cardiac silhouette. Atherosclerotic calcification of the aorta. No acute osseous pathology. IMPRESSION: 1. Left lower lung field density, likely chronic. Developing infiltrate is less likely but not excluded. Clinical correlation is recommended. 2. Advanced emphysema. Electronically   Signed   By: Arash  Radparvar M.D.   On: 05/27/2019 00:41  ° ° ° °TODAY-DAY OF DISCHARGE: ° °Subjective:  ° °Alice Wallace  today has no headache,no chest abdominal pain,no new weakness tingling or numbness, ° °Objective:  ° °Blood pressure 136/65, pulse 75, temperature 98 °F (36.7 °C), temperature source Oral, resp. rate 18, height 5' 3" (1.6 m), weight 59 kg, SpO2 93 %. °No intake or output data in the 24 hours ending 05/31/19 1559 °Filed Weights  ° 05/27/19 1630  °Weight: 59 kg  ° ° °Exam: °Awake Alert °[place].AT,PERRAL °Supple Neck,No JVD, No cervical lymphadenopathy appriciated.  °Symmetrical Chest wall movement, Good air movement bilaterally, CTAB °RRR,No Gallops,Rubs or new Murmurs, No Parasternal Heave °+ve B.Sounds, Abd Soft, Non tender, No organomegaly appriciated, No rebound -guarding or rigidity. °No Cyanosis, Clubbing or edema, No new Rash or bruise ° ° °PERTINENT RADIOLOGIC STUDIES: °US RENAL ° °Result Date: 05/28/2019 °CLINICAL DATA:  Acute kidney injury. EXAM: RENAL / URINARY TRACT ULTRASOUND COMPLETE COMPARISON:  None. FINDINGS: Right Kidney: Renal measurements: 10.4 x 4.2 x 4.3 cm = volume: 97.5 mL . Echogenicity within normal limits. No mass or hydronephrosis visualized. Left Kidney: Renal measurements: 10.3 x 5.5 x 5.9 cm = volume: 176 mL. Echogenicity within normal limits. No mass or hydronephrosis visualized. Bladder: Appears normal for degree of bladder distention. Other: None. IMPRESSION: Normal exam. Electronically Signed   By: James  Maxwell M.D.   On: 05/28/2019 09:39  ° °DG CHEST PORT 1 VIEW ° °Result Date: 05/28/2019 °CLINICAL DATA:  Hypoxia EXAM: PORTABLE CHEST 1 VIEW COMPARISON:  05/27/2019, 01/17/2019, 04/26/2017 FINDINGS: Unchanged AP portable examination with heterogeneous airspace opacity of the left lung base and severe underlying emphysema. No new airspace opacity. Numerous fracture and resection deformities of the ribs. The heart and mediastinum are unremarkable. IMPRESSION: Unchanged AP portable examination with heterogeneous airspace opacity of the left lung base, suspicious for infection or aspiration  although there is some degree of underlying chronic interstitial scarring. Electronically Signed   By: Alex  Bibbey M.D.   On: 05/28/2019 08:33  ° °DG Chest Port 1 View ° °Result Date: 05/27/2019 °CLINICAL DATA:  78-year-old female with respiratory distress. EXAM: PORTABLE CHEST 1 VIEW COMPARISON:  Chest radiograph dated 01/17/2019 and CT dated 11/21/2018. FINDINGS: Emphysema. Bilateral mid to lower lung field interstitial coarsening as well as hazy airspace density primarily involving the left lung base, likely chronic. Developing infiltrate is less likely. Clinical correlation is recommended. No pleural effusion or pneumothorax. Right upper lobe calcified nodules again noted. Stable cardiac silhouette. Atherosclerotic calcification of the aorta. No acute osseous pathology. IMPRESSION: 1. Left lower lung field density, likely chronic. Developing infiltrate is less likely but not excluded. Clinical correlation is recommended. 2. Advanced emphysema. Electronically Signed   By: Arash  Radparvar M.D.   On: 05/27/2019 00:41  ° ° ° °PERTINENT LAB RESULTS: °CBC: °Recent Labs  °  05/29/19 °0138 05/30/19 °0057  °WBC 9.8 7.2  °HGB 8.8* 8.9*  °HCT 28.0* 27.9*  °PLT 184 232  ° °CMET °CMP  °   °Component Value Date/Time  ° NA 143 05/30/2019 0057  ° K 3.3 (L) 05/30/2019 0057  ° CL 99 05/30/2019 0057  ° CO2 32 05/30/2019 0057  ° GLUCOSE 123 (H) 05/30/2019 0057  ° BUN 35 (H) 05/30/2019 0057  ° CREATININE 0.64 05/30/2019 0057  ° CALCIUM 8.9 05/30/2019 0057  ° PROT 6.0 (L) 05/30/2019 0057  ° ALBUMIN 3.0 (L) 05/30/2019 0057  ° AST 21 05/30/2019 0057  °   ALT 12 05/30/2019 0057  ° ALKPHOS 45 05/30/2019 0057  ° BILITOT 0.5 05/30/2019 0057  ° GFRNONAA >60 05/30/2019 0057  ° GFRAA >60 05/30/2019 0057  ° ° °GFR °Estimated Creatinine Clearance: 47.9 mL/min (by C-G formula based on SCr of 0.64 mg/dL). °No results for input(s): LIPASE, AMYLASE in the last 72 hours. °No results for input(s): CKTOTAL, CKMB, CKMBINDEX, TROPONINI in the last 72  hours. °Invalid input(s): POCBNP °Recent Labs  °  05/29/19 °0138 05/30/19 °0057  °DDIMER 1.62* 1.32*  ° °No results for input(s): HGBA1C in the last 72 hours. °No results for input(s): CHOL, HDL, LDLCALC, TRIG, CHOLHDL, LDLDIRECT in the last 72 hours. °No results for input(s): TSH, T4TOTAL, T3FREE, THYROIDAB in the last 72 hours. ° °Invalid input(s): FREET3 °Recent Labs  °  05/29/19 °0138 05/30/19 °0057  °FERRITIN 918* 853*  ° °Coags: °No results for input(s): INR in the last 72 hours. ° °Invalid input(s): PT °Microbiology: °Recent Results (from the past 240 hour(s))  °Culture, blood (Routine X 2) w Reflex to ID Panel     Status: None (Preliminary result)  ° Collection Time: 05/27/19 12:00 AM  ° Specimen: BLOOD  °Result Value Ref Range Status  ° Specimen Description BLOOD LEFT ANTECUBITAL  Final  ° Special Requests   Final  °  BOTTLES DRAWN AEROBIC AND ANAEROBIC Blood Culture adequate volume  ° Culture   Final  °  NO GROWTH 4 DAYS °Performed at [facility], [address] [place], [place] [address] °  ° Report Status PENDING  Incomplete  °Culture, blood (Routine X 2) w Reflex to ID Panel     Status: None (Preliminary result)  ° Collection Time: 05/27/19 12:00 AM  ° Specimen: BLOOD  °Result Value Ref Range Status  ° Specimen Description BLOOD RIGHT ASSIST CONTROL  Final  ° Special Requests   Final  °  BOTTLES DRAWN AEROBIC AND ANAEROBIC Blood Culture adequate volume  ° Culture   Final  °  NO GROWTH 4 DAYS °Performed at [facility], [address] [place], [place] [address] °  ° Report Status PENDING  Incomplete  °Urine culture     Status: Abnormal  ° Collection Time: 05/27/19 12:30 AM  ° Specimen: Urine, Random  °Result Value Ref Range Status  ° Specimen Description   Final  °  URINE, RANDOM °Performed at [facility], [address] [place], [place] [address] °  ° Special Requests   Final  °  NONE °Performed at [facility], [address] [place], [place] [address] °  °  Culture >=100,000 COLONIES/mL ESCHERICHIA COLI (A)  Final  ° Report Status 05/28/2019 FINAL  Final  ° Organism ID, Bacteria ESCHERICHIA COLI (A)  Final  °    Susceptibility  ° Escherichia coli - MIC*  °  AMPICILLIN 16 INTERMEDIATE Intermediate   °  CEFAZOLIN <=4 SENSITIVE Sensitive   °  CEFTRIAXONE <=0.25 SENSITIVE Sensitive   °  CIPROFLOXACIN <=0.25 SENSITIVE Sensitive   °  GENTAMICIN <=1 SENSITIVE Sensitive   °  IMIPENEM <=0.25 SENSITIVE Sensitive   °  NITROFURANTOIN <=16 SENSITIVE Sensitive   °  TRIMETH/SULFA <=20 SENSITIVE Sensitive   °  AMPICILLIN/SULBACTAM 4 SENSITIVE Sensitive   °  PIP/TAZO <=4 SENSITIVE Sensitive   °  * >=100,000 COLONIES/mL ESCHERICHIA COLI  °Culture, sputum-assessment     Status: None  ° Collection Time: 05/27/19  8:05 PM  ° Specimen: Expectorated Sputum  °Result Value Ref Range Status  ° Specimen   Description EXPECTORATED SPUTUM  Final  ° Special Requests NONE  Final  ° Sputum evaluation   Final  °  THIS SPECIMEN IS ACCEPTABLE FOR SPUTUM CULTURE °Performed at [facility], [address] [place], [place] [address] °  ° Report Status 05/29/2019 FINAL  Final  °Culture, respiratory     Status: None (Preliminary result)  ° Collection Time: 05/27/19  8:05 PM  °Result Value Ref Range Status  ° Specimen Description   Final  °  EXPECTORATED SPUTUM °Performed at [facility], [address] [place], [place] [address] °  ° Special Requests   Final  °  NONE Reflexed from [id number] °Performed at [facility], [address] [place], [place] [address] °  ° Gram Stain   Final  °  FEW WBC PRESENT,BOTH PMN AND MONONUCLEAR °FEW YEAST °  ° Culture   Final  °  CULTURE REINCUBATED FOR BETTER GROWTH °Performed at [facility], [address] [place], [place] [address] °  ° Report Status PENDING  Incomplete  ° ° °FURTHER DISCHARGE INSTRUCTIONS: ° °Get Medicines reviewed and adjusted: °Please take all your medications with you for your next visit  with your Primary MD ° °Laboratory/radiological data: °Please request your Primary MD to go over all hospital tests and procedure/radiological results at the follow up, please ask your Primary MD to get all Hospital records sent to his/her office. ° °In some cases, they will be blood work, cultures and biopsy results pending at the time of your discharge. Please request that your primary care M.D. goes through all the records of your hospital data and follows up on these results. ° °Also Note the following: °If you experience worsening of your admission symptoms, develop shortness of breath, life threatening emergency, suicidal or homicidal thoughts you must seek medical attention immediately by calling 911 or calling your MD immediately  if symptoms less severe. ° °You must read complete instructions/literature along with all the possible adverse reactions/side effects for all the Medicines you take and that have been prescribed to you. Take any new Medicines after you have completely understood and accpet all the possible adverse reactions/side effects.  ° °Do not drive when taking Pain medications or sleeping medications (Benzodaizepines) ° °Do not take more than prescribed Pain, Sleep and Anxiety Medications. It is not advisable to combine anxiety,sleep and pain medications without talking with your primary care practitioner ° °Special Instructions: If you have smoked or chewed Tobacco  in the last 2 yrs please stop smoking, stop any regular Alcohol  and or any Recreational drug use. ° °Wear Seat belts while driving. ° °Please note: °You were cared for by a hospitalist during your hospital stay. Once you are discharged, your primary care physician will handle any further medical issues. Please note that NO REFILLS for any discharge medications will be authorized once you are discharged, as it is imperative that you return to your primary care physician (or establish a relationship with a primary care physician  if you do not have one) for your post hospital discharge needs so that they can reassess your need for medications and monitor your lab values. ° °Total Time spent coordinating discharge including counseling, education and face to face time equals 35 minutes. ° °Signed: °Dorlene Footman °05/31/2019 °3:59 PM ° °

## 2019-05-31 NOTE — Progress Notes (Deleted)
PATIENT DETAILS Name: Alice LeveringMadlyn Wallace Age: 79 y.o. Sex: female Date of Birth: 11/19/1940 MRN: [id number]. Admitting Physician: Zigmund DanielA Caldwell Powell Jr., MD XBJ:YNWGNFAPCP:Lambert, Kennith Centerracey, NP  Admit Date: 05/27/2019 Discharge date: 05/30/2019  Recommendations for Outpatient Follow-up:  1. Follow up with PCP in 1-2 weeks 2. Please obtain CMP/CBC in one week 3. Repeat Chest Xray in 4-6 week  Admitted From:  ALF   Disposition: ALF with HHPT/OT   Home Health:  Yes  Equipment/Devices:  Resume 3L/M of O2  Discharge Condition: Stable  CODE STATUS: FULL CODE  Diet recommendation:  Diet Order            Diet - low sodium heart healthy               Brief Summary: See H&P, Labs, Consult and Test reports for all details in brief, Patient is a 79 y.o. female with PMHx of COPD on 3 L of oxygen, HTN, osteoporosis, deaf/mute-who presented with shortness of breath-she was found to have acute on chronic hypoxemic respiratory failure secondary to COVID-19 pneumonia.  She was subsequently admitted to the hospitalist service.  See below for further details.  Brief Hospital Course: Acute on chronic hypoxic Resp Failure due to Covid 19 Viral pneumonia and concurrent bacterial pneumonia:  Much improved-treated with a course of remdesivir which she completed on 1/8, will continue tapering steroids on discharge.  She is back on her usual home regimen of 3 L of oxygen.  She was also treated with Rocephin/Zithromax-[facility].  Please repeat two-view chest x-ray in 4 to 6 weeks to document resolution of pneumonia.  COVID-19 Labs:  Recent Labs    05/29/19 0138 05/30/19 0057  DDIMER 1.62* 1.32*  FERRITIN 918* 853*  CRP 22.1* 13.5*    No results found for: SARSCOV2NAA   COVID-19 Medications: Steroids: 1/4>> Remdesivir: 1/4>> 1/8  Other medications: Diuretics:Euvolemic-no need for lasix Antibiotics: Rocephin: 1/4>>1/8 Zithromax: 1/4>> 1/8  Melena: Large  melanotic stools noted in the ED on admission-Per nursing staff-none noted here.  Stable at 8.9.  Continue PPI.  Since hemoglobin stable-and no further melena-no need for GI/endoscopic evaluation during this hospital stay, can be pursued in the outpatient setting if needed.  E. coli UTI: Given her deaf/mute status-not sure if she is symptomatic-in any event-she was covered with Rocephin (used primarily for PNA)  AKI: Resolved-likely hemodynamically mediated.  Hypokalemia: Replete and recheck.  COPD with chronic hypoxic respiratory failure on 3 L of oxygen: Stable-continue inhalers.  HTN: Controlled-continue Cardizem  RN pressure injury documentation: Pressure Injury 05/27/19 Ischial tuberosity Left Unstageable - Full thickness tissue loss in which the base of the injury is covered by slough (yellow, tan, gray, green or brown) and/or eschar (tan, brown or black) in the wound bed. (Active)  05/27/19 1630  Location: Ischial tuberosity  Location Orientation: Left  Staging: Unstageable - Full thickness tissue loss in which the base of the injury is covered by slough (yellow, tan, gray, green or brown) and/or eschar (tan, brown or black) in the wound bed.  Wound Description (Comments):   Present on Admission: Yes    Procedures/Studies: None  Discharge Diagnoses:  Active Problems:   COVID-19 virus infection   Discharge Instructions:    Person Under Monitoring Name: Alice Wallace  Location[place] [address] [place] [id number]   Infection Prevention Recommendations for Individuals Confirmed to have, or Being Evaluated for, 2019 Novel Coronavirus (COVID-19) Infection Who Receive Care at Home  Individuals  who are confirmed to have, or are being evaluated for, COVID-19 should follow the prevention steps below until a healthcare provider or local or state health department says they can return to normal activities.  Stay home except to get medical care You should  restrict activities outside your home, except for getting medical care. Do not go to work, school, or public areas, and do not use public transportation or taxis.  Call ahead before visiting your doctor Before your medical appointment, call the healthcare provider and tell them that you have, or are being evaluated for, COVID-19 infection. This will help the healthcare providers office take steps to keep other people from getting infected. Ask your healthcare provider to call the local or state health department.  Monitor your symptoms Seek prompt medical attention if your illness is worsening (e.g., difficulty breathing). Before going to your medical appointment, call the healthcare provider and tell them that you have, or are being evaluated for, COVID-19 infection. Ask your healthcare provider to call the local or state health department.  Wear a facemask You should wear a facemask that covers your nose and mouth when you are in the same room with other people and when you visit a healthcare provider. People who live with or visit you should also wear a facemask while they are in the same room with you.  Separate yourself from other people in your home As much as possible, you should stay in a different room from other people in your home. Also, you should use a separate bathroom, if available.  Avoid sharing household items You should not share dishes, drinking glasses, cups, eating utensils, towels, bedding, or other items with other people in your home. After using these items, you should wash them thoroughly with soap and water.  Cover your coughs and sneezes Cover your mouth and nose with a tissue when you cough or sneeze, or you can cough or sneeze into your sleeve. Throw used tissues in a lined trash can, and immediately wash your hands with soap and water for at least 20 seconds or use an alcohol-based hand rub.  Wash your [employer] your hands often and thoroughly with  soap and water for at least 20 seconds. You can use an alcohol-based hand sanitizer if soap and water are not available and if your hands are not visibly dirty. Avoid touching your eyes, nose, and mouth with unwashed hands.   Prevention Steps for Caregivers and Household Members of Individuals Confirmed to have, or Being Evaluated for, COVID-19 Infection Being Cared for in the Home  If you live with, or provide care at home for, a person confirmed to have, or being evaluated for, COVID-19 infection please follow these guidelines to prevent infection:  Follow healthcare providers instructions Make sure that you understand and can help the patient follow any healthcare provider instructions for all care.  Provide for the patients basic needs You should help the patient with basic needs in the home and provide support for getting groceries, prescriptions, and other personal needs.  Monitor the patients symptoms If they are getting sicker, call his or her medical provider and tell them that the patient has, or is being evaluated for, COVID-19 infection. This will help the healthcare providers office take steps to keep other people from getting infected. Ask the healthcare provider to call the local or state health department.  Limit the number of people who have contact with the patient  If possible, have only one caregiver for the  patient.  Other household members should stay in another home or place of residence. If this is not possible, they should stay  in another room, or be separated from the patient as much as possible. Use a separate bathroom, if available.  Restrict visitors who do not have an essential need to be in the home.  Keep older adults, very young children, and other sick people away from the patient Keep older adults, very young children, and those who have compromised immune systems or chronic health conditions away from the patient. This includes people with  chronic heart, lung, or kidney conditions, diabetes, and cancer.  Ensure good ventilation Make sure that shared spaces in the home have good air flow, such as from an air conditioner or an opened window, weather permitting.  Wash your hands often  Wash your hands often and thoroughly with soap and water for at least 20 seconds. You can use an alcohol based hand sanitizer if soap and water are not available and if your hands are not visibly dirty.  Avoid touching your eyes, nose, and mouth with unwashed hands.  Use disposable paper towels to dry your hands. If not available, use dedicated cloth towels and replace them when they become wet.  Wear a facemask and gloves  Wear a disposable facemask at all times in the room and gloves when you touch or have contact with the patients blood, body fluids, and/or secretions or excretions, such as sweat, saliva, sputum, nasal mucus, vomit, urine, or feces.  Ensure the mask fits over your nose and mouth tightly, and do not touch it during use.  Throw out disposable facemasks and gloves after using them. Do not reuse.  Wash your hands immediately after removing your facemask and gloves.  If your personal clothing becomes contaminated, carefully remove clothing and launder. Wash your hands after handling contaminated clothing.  Place all used disposable facemasks, gloves, and other waste in a lined container before disposing them with other household waste.  Remove gloves and wash your hands immediately after handling these items.  Do not share dishes, glasses, or other household items with the patient  Avoid sharing household items. You should not share dishes, drinking glasses, cups, eating utensils, towels, bedding, or other items with a patient who is confirmed to have, or being evaluated for, COVID-19 infection.  After the person uses these items, you should wash them thoroughly with soap and water.  Wash laundry thoroughly  Immediately  remove and wash clothes or bedding that have blood, body fluids, and/or secretions or excretions, such as sweat, saliva, sputum, nasal mucus, vomit, urine, or feces, on them.  Wear gloves when handling laundry from the patient.  Read and follow directions on labels of laundry or clothing items and detergent. In general, wash and dry with the warmest temperatures recommended on the label.  Clean all areas the individual has used often  Clean all touchable surfaces, such as counters, tabletops, doorknobs, bathroom fixtures, toilets, phones, keyboards, tablets, and bedside tables, every day. Also, clean any surfaces that may have blood, body fluids, and/or secretions or excretions on them.  Wear gloves when cleaning surfaces the patient has come in contact with.  Use a diluted bleach solution (e.g., dilute bleach with 1 part bleach and 10 parts water) or a household disinfectant with a label that says EPA-registered for coronaviruses. To make a bleach solution at home, add 1 tablespoon of bleach to 1 quart (4 cups) of water. For a larger supply, add  cup of bleach to 1 gallon (16 cups) of water.  Read labels of cleaning products and follow recommendations provided on product labels. Labels contain instructions for safe and effective use of the cleaning product including precautions you should take when applying the product, such as wearing gloves or eye protection and making sure you have good ventilation during use of the product.  Remove gloves and wash hands immediately after cleaning.  Monitor yourself for signs and symptoms of illness Caregivers and household members are considered close contacts, should monitor their health, and will be asked to limit movement outside of the home to the extent possible. Follow the monitoring steps for close contacts listed on the symptom monitoring form.   ? If you have additional questions, contact your local health department or call the epidemiologist  on call at [phone] (available 24/7). ? This guidance is subject to change. For the most up-to-date guidance from CDC, please refer to their website: YouBlogs.pl    Activity:  As tolerated   Discharge Instructions    Call MD for:  difficulty breathing, headache or visual disturbances   Complete by: As directed    Call MD for:  extreme fatigue   Complete by: As directed    Call MD for:  persistant dizziness or light-headedness   Complete by: As directed    Call MD for:  persistant nausea and vomiting   Complete by: As directed    Diet - low sodium heart healthy   Complete by: As directed    Discharge instructions   Complete by: As directed    Follow with Primary MD  Odessa Fleming, NP in 1-2 weeks  Please get a complete blood count and chemistry panel checked by your Primary MD at your next visit, and again as instructed by your Primary MD.  Get Medicines reviewed and adjusted: Please take all your medications with you for your next visit with your Primary MD  Laboratory/radiological data: Please request your Primary MD to go over all hospital tests and procedure/radiological results at the follow up, please ask your Primary MD to get all Hospital records sent to his/her office.  In some cases, they will be blood work, cultures and biopsy results pending at the time of your discharge. Please request that your primary care M.D. follows up on these results.  Also Note the following: If you experience worsening of your admission symptoms, develop shortness of breath, life threatening emergency, suicidal or homicidal thoughts you must seek medical attention immediately by calling 911 or calling your MD immediately  if symptoms less severe.  You must read complete instructions/literature along with all the possible adverse reactions/side effects for all the Medicines you take and that have been prescribed to you. Take  any new Medicines after you have completely understood and accpet all the possible adverse reactions/side effects.   Do not drive when taking Pain medications or sleeping medications (Benzodaizepines)  Do not take more than prescribed Pain, Sleep and Anxiety Medications. It is not advisable to combine anxiety,sleep and pain medications without talking with your primary care practitioner  Special Instructions: If you have smoked or chewed Tobacco  in the last 2 yrs please stop smoking, stop any regular Alcohol  and or any Recreational drug use.  Wear Seat belts while driving.  Please note: You were cared for by a hospitalist during your hospital stay. Once you are discharged, your primary care physician will handle any further medical issues. Please note that NO REFILLS for any  discharge medications will be authorized once you are discharged, as it is imperative that you return to your primary care physician (or establish a relationship with a primary care physician if you do not have one) for your post hospital discharge needs so that they can reassess your need for medications and monitor your lab values.   Isolation for 3 weeks from 05/27/2019.   Increase activity slowly   Complete by: As directed      Allergies as of 05/30/2019      Reactions   Hydrocodone    Other reaction(s): Unknown   Naproxen    Other reaction(s): Unknown   Other Other (See Comments)   Sulfamethoxazole-trimethoprim    Other reaction(s): Unknown   Tramadol    Other reaction(s): Unknown Pt has tolerated this med 1/5-1/7 with no issues      Medication List    STOP taking these medications   aspirin EC 81 MG tablet   omeprazole 40 MG capsule Commonly known as: PRILOSEC     TAKE these medications   acetaminophen 500 MG tablet Commonly known as: TYLENOL Take 500 mg by mouth 3 (three) times daily.   acetaminophen 500 MG tablet Commonly known as: TYLENOL Take 500 mg by mouth every 8 (eight) hours as needed  for moderate pain or fever.   albuterol 108 (90 Base) MCG/ACT inhaler Commonly known as: VENTOLIN HFA Inhale 2 puffs into the lungs every 4 (four) hours as needed for wheezing or shortness of breath.   albuterol (2.5 MG/3ML) 0.083% nebulizer solution Commonly known as: PROVENTIL Take 2.5 mg by nebulization every 6 (six) hours as needed for wheezing or shortness of breath.   alendronate 70 MG tablet Commonly known as: FOSAMAX Take 70 mg by mouth once a week. Take with a full glass of water on an empty stomach.   aluminum-magnesium hydroxide-simethicone 200-200-20 MG/5ML Susp Commonly known as: MAALOX Take 30 mLs by mouth 4 (four) times daily -  before meals and at bedtime.   Calcium 600-D 600-400 MG-UNIT Tabs Generic drug: Calcium Carbonate-Vitamin D3 Take 1 tablet by mouth 2 (two) times daily.   cefdinir 300 MG capsule Commonly known as: OMNICEF Take 1 capsule (300 mg total) by mouth 2 (two) times daily for 2 days.   cetirizine 10 MG tablet Commonly known as: ZYRTEC Take 10 mg by mouth daily.   diclofenac sodium 1 % Gel Commonly known as: VOLTAREN Apply 2 g topically 3 (three) times daily.   diltiazem 240 MG 24 hr capsule Commonly known as: CARDIZEM CD Take 240 mg by mouth daily.   diphenhydrAMINE 25 MG tablet Commonly known as: BENADRYL Take 25 mg by mouth every 6 (six) hours as needed for allergies.   escitalopram 10 MG tablet Commonly known as: LEXAPRO Take 10 mg by mouth at bedtime.   ferrous sulfate 325 (65 FE) MG tablet Take 325 mg by mouth 2 (two) times daily with a meal.   Fluticasone-Salmeterol 100-50 MCG/DOSE Aepb Commonly known as: ADVAIR Inhale 1 puff into the lungs 2 (two) times daily.   furosemide 20 MG tablet Commonly known as: LASIX Take 20 mg by mouth daily.   guaifenesin 100 MG/5ML syrup Commonly known as: ROBITUSSIN Take 300 mg by mouth 4 (four) times daily as needed for cough.   loperamide 2 MG tablet Commonly known as: IMODIUM  A-D Take 2 mg by mouth daily as needed for diarrhea or loose stools.   metolazone 5 MG tablet Commonly known as: ZAROXOLYN Take 5 mg by  mouth daily.   pantoprazole 40 MG tablet Commonly known as: Protonix Take 1 tablet (40 mg total) by mouth daily.   potassium chloride SA 20 MEQ tablet Commonly known as: KLOR-CON Take 2 tablets (40 mEq total) by mouth daily. What changed: how much to take   predniSONE 10 MG tablet Commonly known as: DELTASONE Take 40 mg daily for 1 day, 30 mg daily for 1 day, 20 mg daily for 1 days,10 mg daily for 1 day, then stop   tiZANidine 4 MG tablet Commonly known as: ZANAFLEX Take 4 mg by mouth daily.   traMADol 50 MG tablet Commonly known as: ULTRAM Take 1 tablet (50 mg total) by mouth every 8 (eight) hours for 3 days. What changed: Another medication with the same name was removed. Continue taking this medication, and follow the directions you see here.   Vitamin D 125 MCG (5000 UT) Caps Take 5,000 Units by mouth daily.      Follow-up Information    Devoria Glassing, NP. Schedule an appointment as soon as possible for a visit in 2 week(s).   Specialty: Nurse Practitioner Contact information: [address] [place] [phone]          Allergies  Allergen Reactions   Hydrocodone     Other reaction(s): Unknown   Naproxen     Other reaction(s): Unknown   Other Other (See Comments)   Sulfamethoxazole-Trimethoprim     Other reaction(s): Unknown   Tramadol     Other reaction(s): Unknown Pt has tolerated this med 1/5-1/7 with no issues    Consultations:   None  Other Procedures/Studies: US RENAL  Result Date: 05/28/2019 CLINICAL DATA:  Acute kidney injury. EXAM: RENAL / URINARY TRACT ULTRASOUND COMPLETE COMPARISON:  None. FINDINGS: Right Kidney: Renal measurements: 10.4 x 4.2 x 4.3 cm = volume: 97.5 mL . Echogenicity within normal limits. No mass or hydronephrosis visualized. Left Kidney: Renal  measurements: 10.3 x 5.5 x 5.9 cm = volume: 176 mL. Echogenicity within normal limits. No mass or hydronephrosis visualized. Bladder: Appears normal for degree of bladder distention. Other: None. IMPRESSION: Normal exam. Electronically Signed   By: Francene Boyers M.D.   On: 05/28/2019 09:39   DG CHEST PORT 1 VIEW  Result Date: 05/28/2019 CLINICAL DATA:  Hypoxia EXAM: PORTABLE CHEST 1 VIEW COMPARISON:  05/27/2019, 01/17/2019, 04/26/2017 FINDINGS: Unchanged AP portable examination with heterogeneous airspace opacity of the left lung base and severe underlying emphysema. No new airspace opacity. Numerous fracture and resection deformities of the ribs. The heart and mediastinum are unremarkable. IMPRESSION: Unchanged AP portable examination with heterogeneous airspace opacity of the left lung base, suspicious for infection or aspiration although there is some degree of underlying chronic interstitial scarring. Electronically Signed   By: Lauralyn Primes M.D.   On: 05/28/2019 08:33   DG Chest Port 1 View  Result Date: 05/27/2019 CLINICAL DATA:  79 year old female with respiratory distress. EXAM: PORTABLE CHEST 1 VIEW COMPARISON:  Chest radiograph dated 01/17/2019 and CT dated 11/21/2018. FINDINGS: Emphysema. Bilateral mid to lower lung field interstitial coarsening as well as hazy airspace density primarily involving the left lung base, likely chronic. Developing infiltrate is less likely. Clinical correlation is recommended. No pleural effusion or pneumothorax. Right upper lobe calcified nodules again noted. Stable cardiac silhouette. Atherosclerotic calcification of the aorta. No acute osseous pathology. IMPRESSION: 1. Left lower lung field density, likely chronic. Developing infiltrate is less likely but not excluded. Clinical correlation is recommended. 2. Advanced emphysema. Electronically  Signed   By: Elgie Collard M.D.   On: 05/27/2019 00:41     TODAY-DAY OF DISCHARGE:  Subjective:   Alice Wallace  today has no headache,no chest abdominal pain,no new weakness tingling or numbness,  Objective:   Blood pressure 136/65, pulse 75, temperature 98 F (36.7 C), temperature source Oral, resp. rate 18, height 5\' 3"  (1.6 m), weight 59 kg, SpO2 93 %. No intake or output data in the 24 hours ending 05/31/19 1559 Filed Weights   05/27/19 1630  Weight: 59 kg    Exam: Awake Alert [place].AT,PERRAL Supple Neck,No JVD, No cervical lymphadenopathy appriciated.  Symmetrical Chest wall movement, Good air movement bilaterally, CTAB RRR,No Gallops,Rubs or new Murmurs, No Parasternal Heave +ve B.Sounds, Abd Soft, Non tender, No organomegaly appriciated, No rebound -guarding or rigidity. No Cyanosis, Clubbing or edema, No new Rash or bruise   PERTINENT RADIOLOGIC STUDIES: 07/25/19 RENAL  Result Date: 05/28/2019 CLINICAL DATA:  Acute kidney injury. EXAM: RENAL / URINARY TRACT ULTRASOUND COMPLETE COMPARISON:  None. FINDINGS: Right Kidney: Renal measurements: 10.4 x 4.2 x 4.3 cm = volume: 97.5 mL . Echogenicity within normal limits. No mass or hydronephrosis visualized. Left Kidney: Renal measurements: 10.3 x 5.5 x 5.9 cm = volume: 176 mL. Echogenicity within normal limits. No mass or hydronephrosis visualized. Bladder: Appears normal for degree of bladder distention. Other: None. IMPRESSION: Normal exam. Electronically Signed   By: 07/26/2019 M.D.   On: 05/28/2019 09:39   DG CHEST PORT 1 VIEW  Result Date: 05/28/2019 CLINICAL DATA:  Hypoxia EXAM: PORTABLE CHEST 1 VIEW COMPARISON:  05/27/2019, 01/17/2019, 04/26/2017 FINDINGS: Unchanged AP portable examination with heterogeneous airspace opacity of the left lung base and severe underlying emphysema. No new airspace opacity. Numerous fracture and resection deformities of the ribs. The heart and mediastinum are unremarkable. IMPRESSION: Unchanged AP portable examination with heterogeneous airspace opacity of the left lung base, suspicious for infection or aspiration  although there is some degree of underlying chronic interstitial scarring. Electronically Signed   By: 14/10/2016 M.D.   On: 05/28/2019 08:33   DG Chest Port 1 View  Result Date: 05/27/2019 CLINICAL DATA:  79 year old female with respiratory distress. EXAM: PORTABLE CHEST 1 VIEW COMPARISON:  Chest radiograph dated 01/17/2019 and CT dated 11/21/2018. FINDINGS: Emphysema. Bilateral mid to lower lung field interstitial coarsening as well as hazy airspace density primarily involving the left lung base, likely chronic. Developing infiltrate is less likely. Clinical correlation is recommended. No pleural effusion or pneumothorax. Right upper lobe calcified nodules again noted. Stable cardiac silhouette. Atherosclerotic calcification of the aorta. No acute osseous pathology. IMPRESSION: 1. Left lower lung field density, likely chronic. Developing infiltrate is less likely but not excluded. Clinical correlation is recommended. 2. Advanced emphysema. Electronically Signed   By: 01/22/2019 M.D.   On: 05/27/2019 00:41     PERTINENT LAB RESULTS: CBC: Recent Labs    05/29/19 0138 05/30/19 0057  WBC 9.8 7.2  HGB 8.8* 8.9*  HCT 28.0* 27.9*  PLT 184 232   CMET CMP     Component Value Date/Time   NA 143 05/30/2019 0057   K 3.3 (L) 05/30/2019 0057   CL 99 05/30/2019 0057   CO2 32 05/30/2019 0057   GLUCOSE 123 (H) 05/30/2019 0057   BUN 35 (H) 05/30/2019 0057   CREATININE 0.64 05/30/2019 0057   CALCIUM 8.9 05/30/2019 0057   PROT 6.0 (L) 05/30/2019 0057   ALBUMIN 3.0 (L) 05/30/2019 0057   AST 21 05/30/2019 0057  ALT 12 05/30/2019 0057   ALKPHOS 45 05/30/2019 0057   BILITOT 0.5 05/30/2019 0057   GFRNONAA >60 05/30/2019 0057   GFRAA >60 05/30/2019 0057    GFR Estimated Creatinine Clearance: 47.9 mL/min (by C-G formula based on SCr of 0.64 mg/dL). No results for input(s): LIPASE, AMYLASE in the last 72 hours. No results for input(s): CKTOTAL, CKMB, CKMBINDEX, TROPONINI in the last 72  hours. Invalid input(s): POCBNP Recent Labs    05/29/19 0138 05/30/19 0057  DDIMER 1.62* 1.32*   No results for input(s): HGBA1C in the last 72 hours. No results for input(s): CHOL, HDL, LDLCALC, TRIG, CHOLHDL, LDLDIRECT in the last 72 hours. No results for input(s): TSH, T4TOTAL, T3FREE, THYROIDAB in the last 72 hours.  Invalid input(s): FREET3 Recent Labs    05/29/19 0138 05/30/19 0057  FERRITIN 918* 853*   Coags: No results for input(s): INR in the last 72 hours.  Invalid input(s): PT Microbiology: Recent Results (from the past 240 hour(s))  Culture, blood (Routine X 2) w Reflex to ID Panel     Status: None (Preliminary result)   Collection Time: 05/27/19 12:00 AM   Specimen: BLOOD  Result Value Ref Range Status   Specimen Description BLOOD LEFT ANTECUBITAL  Final   Special Requests   Final    BOTTLES DRAWN AEROBIC AND ANAEROBIC Blood Culture adequate volume   Culture   Final    NO GROWTH 4 DAYS Performed at [facility], [address], [place], [place] [address]    Report Status PENDING  Incomplete  Culture, blood (Routine X 2) w Reflex to ID Panel     Status: None (Preliminary result)   Collection Time: 05/27/19 12:00 AM   Specimen: BLOOD  Result Value Ref Range Status   Specimen Description BLOOD RIGHT ASSIST CONTROL  Final   Special Requests   Final    BOTTLES DRAWN AEROBIC AND ANAEROBIC Blood Culture adequate volume   Culture   Final    NO GROWTH 4 DAYS Performed at [facility], [address] [place], [place] [address]    Report Status PENDING  Incomplete  Urine culture     Status: Abnormal   Collection Time: 05/27/19 12:30 AM   Specimen: Urine, Random  Result Value Ref Range Status   Specimen Description   Final    URINE, RANDOM Performed at [facility] , [address] [place], [place] [address]    Special Requests   Final    NONE Performed at [facility], [address] [place], [place] [address]     Culture >=100,000 COLONIES/mL ESCHERICHIA COLI (A)  Final   Report Status 05/28/2019 FINAL  Final   Organism ID, Bacteria ESCHERICHIA COLI (A)  Final      Susceptibility   Escherichia coli - MIC*    AMPICILLIN 16 INTERMEDIATE Intermediate     CEFAZOLIN <=4 SENSITIVE Sensitive     CEFTRIAXONE <=0.25 SENSITIVE Sensitive     CIPROFLOXACIN <=0.25 SENSITIVE Sensitive     GENTAMICIN <=1 SENSITIVE Sensitive     IMIPENEM <=0.25 SENSITIVE Sensitive     NITROFURANTOIN <=16 SENSITIVE Sensitive     TRIMETH/SULFA <=20 SENSITIVE Sensitive     AMPICILLIN/SULBACTAM 4 SENSITIVE Sensitive     PIP/TAZO <=4 SENSITIVE Sensitive     * >=100,000 COLONIES/mL ESCHERICHIA COLI  Culture, sputum-assessment     Status: None   Collection Time: 05/27/19  8:05 PM   Specimen: Expectorated Sputum  Result Value Ref Range Status   Specimen  Description EXPECTORATED SPUTUM  Final   Special Requests NONE  Final   Sputum evaluation   Final    THIS SPECIMEN IS ACCEPTABLE FOR SPUTUM CULTURE Performed at [facility], [address] [place], [place] [address]    Report Status 05/29/2019 FINAL  Final  Culture, respiratory     Status: None (Preliminary result)   Collection Time: 05/27/19  8:05 PM  Result Value Ref Range Status   Specimen Description   Final    EXPECTORATED SPUTUM Performed at [facility], [address] [place], [place] [address]    Special Requests   Final    NONE Reflexed from [id number] Performed at [facility], [address] [place], [place] [address]    Gram Stain   Final    FEW WBC PRESENT,BOTH PMN AND MONONUCLEAR FEW YEAST    Culture   Final    CULTURE REINCUBATED FOR BETTER GROWTH Performed at [facility], [address] [place], [place] [address]    Report Status PENDING  Incomplete    FURTHER DISCHARGE INSTRUCTIONS:  Get Medicines reviewed and adjusted: Please take all your medications with you for your next visit  with your Primary MD  Laboratory/radiological data: Please request your Primary MD to go over all hospital tests and procedure/radiological results at the follow up, please ask your Primary MD to get all Hospital records sent to his/her office.  In some cases, they will be blood work, cultures and biopsy results pending at the time of your discharge. Please request that your primary care M.D. goes through all the records of your hospital data and follows up on these results.  Also Note the following: If you experience worsening of your admission symptoms, develop shortness of breath, life threatening emergency, suicidal or homicidal thoughts you must seek medical attention immediately by calling 911 or calling your MD immediately  if symptoms less severe.  You must read complete instructions/literature along with all the possible adverse reactions/side effects for all the Medicines you take and that have been prescribed to you. Take any new Medicines after you have completely understood and accpet all the possible adverse reactions/side effects.   Do not drive when taking Pain medications or sleeping medications (Benzodaizepines)  Do not take more than prescribed Pain, Sleep and Anxiety Medications. It is not advisable to combine anxiety,sleep and pain medications without talking with your primary care practitioner  Special Instructions: If you have smoked or chewed Tobacco  in the last 2 yrs please stop smoking, stop any regular Alcohol  and or any Recreational drug use.  Wear Seat belts while driving.  Please note: You were cared for by a hospitalist during your hospital stay. Once you are discharged, your primary care physician will handle any further medical issues. Please note that NO REFILLS for any discharge medications will be authorized once you are discharged, as it is imperative that you return to your primary care physician (or establish a relationship with a primary care physician  if you do not have one) for your post hospital discharge needs so that they can reassess your need for medications and monitor your lab values.  Total Time spent coordinating discharge including counseling, education and face to face time equals 35 minutes.  SignedJeoffrey Massed: Jeremey Bascom 05/31/2019 3:59 PM

## 2019-06-01 LAB — CULTURE, BLOOD (ROUTINE X 2)
Culture: NO GROWTH
Culture: NO GROWTH
Special Requests: ADEQUATE
Special Requests: ADEQUATE

## 2019-06-01 LAB — CULTURE, RESPIRATORY W GRAM STAIN

## 2019-06-07 ENCOUNTER — Emergency Department: Payer: Medicare Other

## 2019-06-07 ENCOUNTER — Other Ambulatory Visit: Payer: Self-pay

## 2019-06-07 ENCOUNTER — Inpatient Hospital Stay
Admission: EM | Admit: 2019-06-07 | Discharge: 2019-06-10 | DRG: 193 | Disposition: A | Discharge status: Skilled Nursing Facility | Payer: Medicare Other | Source: Skilled Nursing Facility | Attending: Internal Medicine | Admitting: Internal Medicine

## 2019-06-07 DIAGNOSIS — Y95 Nosocomial condition: Secondary | ICD-10-CM | POA: Diagnosis present

## 2019-06-07 DIAGNOSIS — M810 Age-related osteoporosis without current pathological fracture: Secondary | ICD-10-CM | POA: Diagnosis present

## 2019-06-07 DIAGNOSIS — H913 Deaf nonspeaking, not elsewhere classified: Secondary | ICD-10-CM | POA: Diagnosis present

## 2019-06-07 DIAGNOSIS — J9601 Acute respiratory failure with hypoxia: Secondary | ICD-10-CM

## 2019-06-07 DIAGNOSIS — J1282 Pneumonia due to coronavirus disease 2019: Secondary | ICD-10-CM | POA: Diagnosis not present

## 2019-06-07 DIAGNOSIS — Z9981 Dependence on supplemental oxygen: Secondary | ICD-10-CM | POA: Diagnosis not present

## 2019-06-07 DIAGNOSIS — Z7952 Long term (current) use of systemic steroids: Secondary | ICD-10-CM

## 2019-06-07 DIAGNOSIS — Z79891 Long term (current) use of opiate analgesic: Secondary | ICD-10-CM

## 2019-06-07 DIAGNOSIS — I10 Essential (primary) hypertension: Secondary | ICD-10-CM | POA: Diagnosis present

## 2019-06-07 DIAGNOSIS — J439 Emphysema, unspecified: Secondary | ICD-10-CM | POA: Diagnosis present

## 2019-06-07 DIAGNOSIS — U071 COVID-19: Secondary | ICD-10-CM

## 2019-06-07 DIAGNOSIS — J9621 Acute and chronic respiratory failure with hypoxia: Secondary | ICD-10-CM | POA: Diagnosis present

## 2019-06-07 DIAGNOSIS — Z79899 Other long term (current) drug therapy: Secondary | ICD-10-CM

## 2019-06-07 DIAGNOSIS — L89890 Pressure ulcer of other site, unstageable: Secondary | ICD-10-CM | POA: Diagnosis present

## 2019-06-07 DIAGNOSIS — J189 Pneumonia, unspecified organism: Secondary | ICD-10-CM

## 2019-06-07 DIAGNOSIS — N179 Acute kidney failure, unspecified: Secondary | ICD-10-CM | POA: Diagnosis present

## 2019-06-07 DIAGNOSIS — Z7983 Long term (current) use of bisphosphonates: Secondary | ICD-10-CM

## 2019-06-07 DIAGNOSIS — E876 Hypokalemia: Secondary | ICD-10-CM | POA: Diagnosis present

## 2019-06-07 DIAGNOSIS — K219 Gastro-esophageal reflux disease without esophagitis: Secondary | ICD-10-CM | POA: Diagnosis present

## 2019-06-07 DIAGNOSIS — Z8616 Personal history of COVID-19: Secondary | ICD-10-CM

## 2019-06-07 DIAGNOSIS — Z7951 Long term (current) use of inhaled steroids: Secondary | ICD-10-CM

## 2019-06-07 DIAGNOSIS — H9193 Unspecified hearing loss, bilateral: Secondary | ICD-10-CM | POA: Diagnosis present

## 2019-06-07 DIAGNOSIS — H9190 Unspecified hearing loss, unspecified ear: Secondary | ICD-10-CM

## 2019-06-07 LAB — CBC WITH DIFFERENTIAL/PLATELET
Abs Immature Granulocytes: 0.13 10*3/uL — ABNORMAL HIGH (ref 0.00–0.07)
Basophils Absolute: 0 10*3/uL (ref 0.0–0.1)
Basophils Relative: 0 %
Eosinophils Absolute: 0.1 10*3/uL (ref 0.0–0.5)
Eosinophils Relative: 1 %
HCT: 34 % — ABNORMAL LOW (ref 36.0–46.0)
Hemoglobin: 11.2 g/dL — ABNORMAL LOW (ref 12.0–15.0)
Immature Granulocytes: 1 %
Lymphocytes Relative: 8 %
Lymphs Abs: 1.3 10*3/uL (ref 0.7–4.0)
MCH: 29.6 pg (ref 26.0–34.0)
MCHC: 32.9 g/dL (ref 30.0–36.0)
MCV: 89.9 fL (ref 80.0–100.0)
Monocytes Absolute: 1.2 10*3/uL — ABNORMAL HIGH (ref 0.1–1.0)
Monocytes Relative: 7 %
Neutro Abs: 13.5 10*3/uL — ABNORMAL HIGH (ref 1.7–7.7)
Neutrophils Relative %: 83 %
Platelets: 392 10*3/uL (ref 150–400)
RBC: 3.78 MIL/uL — ABNORMAL LOW (ref 3.87–5.11)
RDW: 14.6 % (ref 11.5–15.5)
WBC: 16.3 10*3/uL — ABNORMAL HIGH (ref 4.0–10.5)
nRBC: 0 % (ref 0.0–0.2)

## 2019-06-07 LAB — COMPREHENSIVE METABOLIC PANEL
ALT: 14 U/L (ref 0–44)
AST: 19 U/L (ref 15–41)
Albumin: 3.7 g/dL (ref 3.5–5.0)
Alkaline Phosphatase: 53 U/L (ref 38–126)
Anion gap: 12 (ref 5–15)
BUN: 38 mg/dL — ABNORMAL HIGH (ref 8–23)
CO2: 28 mmol/L (ref 22–32)
Calcium: 9.1 mg/dL (ref 8.9–10.3)
Chloride: 98 mmol/L (ref 98–111)
Creatinine, Ser: 1.02 mg/dL — ABNORMAL HIGH (ref 0.44–1.00)
GFR calc Af Amer: >60 mL/min (ref >60)
GFR calc non Af Amer: 53 mL/min — ABNORMAL LOW (ref >60)
Glucose, Bld: 110 mg/dL — ABNORMAL HIGH (ref 70–99)
Potassium: 3.7 mmol/L (ref 3.5–5.1)
Sodium: 138 mmol/L (ref 135–145)
Total Bilirubin: 0.8 mg/dL (ref 0.3–1.2)
Total Protein: 7.1 g/dL (ref 6.5–8.1)

## 2019-06-07 LAB — FIBRIN DERIVATIVES D-DIMER (ARMC ONLY): Fibrin derivatives D-dimer (ARMC): 913.58 ng/mL (FEU) — ABNORMAL HIGH (ref 0.00–499.00)

## 2019-06-07 LAB — LACTIC ACID, PLASMA
Lactic Acid, Venous: 1.1 mmol/L (ref 0.5–1.9)
Lactic Acid, Venous: 1.1 mmol/L (ref 0.5–1.9)

## 2019-06-07 LAB — TROPONIN I (HIGH SENSITIVITY)
Troponin I (High Sensitivity): 33 ng/L — ABNORMAL HIGH (ref <18)
Troponin I (High Sensitivity): 31 ng/L — ABNORMAL HIGH (ref <18)

## 2019-06-07 LAB — PHOSPHORUS: Phosphorus: 2.9 mg/dL (ref 2.5–4.6)

## 2019-06-07 LAB — PROCALCITONIN: Procalcitonin: 0.16 ng/mL

## 2019-06-07 LAB — MAGNESIUM: Magnesium: 1.9 mg/dL (ref 1.7–2.4)

## 2019-06-07 LAB — FERRITIN: Ferritin: 881 ng/mL — ABNORMAL HIGH (ref 11–307)

## 2019-06-07 LAB — BRAIN NATRIURETIC PEPTIDE: B Natriuretic Peptide: 111 pg/mL — ABNORMAL HIGH (ref 0.0–100.0)

## 2019-06-07 MED ORDER — ENOXAPARIN SODIUM 40 MG/0.4ML ~~LOC~~ SOLN
40.0000 mg | SUBCUTANEOUS | Status: DC
  Administered 2019-06-07 – 2019-06-09 (×3): 40 mg via SUBCUTANEOUS
  Filled 2019-06-07 (×3): qty 0.4

## 2019-06-07 MED ORDER — POTASSIUM CHLORIDE 20 MEQ PO PACK
40.0000 meq | PACK | Freq: Two times a day (BID) | ORAL | Status: AC
  Administered 2019-06-07 – 2019-06-08 (×2): 40 meq via ORAL
  Filled 2019-06-07 (×2): qty 2

## 2019-06-07 MED ORDER — SODIUM CHLORIDE 0.9 % IV BOLUS
500.0000 mL | Freq: Once | INTRAVENOUS | Status: AC
  Administered 2019-06-07: 500 mL via INTRAVENOUS

## 2019-06-07 MED ORDER — VANCOMYCIN HCL IN DEXTROSE 1-5 GM/200ML-% IV SOLN
1000.0000 mg | Freq: Once | INTRAVENOUS | Status: AC
  Administered 2019-06-07: 1000 mg via INTRAVENOUS
  Filled 2019-06-07: qty 200

## 2019-06-07 MED ORDER — DEXAMETHASONE SODIUM PHOSPHATE 10 MG/ML IJ SOLN
6.0000 mg | INTRAMUSCULAR | Status: DC
  Administered 2019-06-07 – 2019-06-08 (×2): 6 mg via INTRAVENOUS
  Filled 2019-06-07 (×2): qty 1

## 2019-06-07 MED ORDER — SODIUM CHLORIDE 0.9 % IV SOLN
2.0000 g | Freq: Once | INTRAVENOUS | Status: AC
  Administered 2019-06-07: 2 g via INTRAVENOUS
  Filled 2019-06-07: qty 2

## 2019-06-07 NOTE — ED Provider Notes (Signed)
[facility] Emergency Department Provider Note ____________________________________________   First MD Initiated Contact with Patient 06/07/19 1617     (approximate)  I have reviewed the triage vital signs and the nursing notes.   HISTORY  Chief Complaint Shortness of Breath  Level 5 caveat: History of present illness limited due to hearing and speech impairment  HPI Alice Wallace is a 79 y.o. 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  EMS reports that at her facility, O2 saturation was around 80% on the patient's normal 3 L by nasal cannula.  Past Medical History:  Diagnosis Date  . COPD (chronic obstructive pulmonary disease) (HCC)   . Deaf   . Hypertension   . Osteoporosis     Patient Active Problem List   Diagnosis Date Noted  . Pneumonia due to COVID-19 virus 06/07/2019  . Hypokalemia 06/07/2019  . AKI (acute kidney injury) (HCC) 06/07/2019  . Nonspeaking deaf 06/07/2019  . Acute hypoxemic respiratory failure due to COVID-19 (HCC) 05/27/2019  . COVID-19 virus infection 05/27/2019    History reviewed. No pertinent surgical history.  Prior to Admission medications   Medication Sig Start Date End Date Taking? Authorizing Provider  acetaminophen (TYLENOL) 500 MG tablet Take 500 mg by mouth every 8 (eight) hours as needed for moderate pain or fever.   Yes [provider]  albuterol (PROVENTIL) (2.5 MG/3ML) 0.083% nebulizer solution Take 2.5 mg by nebulization every 6 (six) hours as needed for wheezing or shortness of breath.   Yes [provider]  albuterol (VENTOLIN HFA) 108 (90 Base) MCG/ACT inhaler Inhale 2 puffs into the lungs every 4 (four) hours as needed for wheezing or shortness of breath.    [provider]  alendronate (FOSAMAX) 70 MG tablet Take 70  mg by mouth once a week. Take with a full glass of water on an empty stomach.    [provider]  aluminum-magnesium hydroxide-simethicone (MAALOX) 200-200-20 MG/5ML SUSP Take 30 mLs by mouth 4 (four) times daily -  before meals and at bedtime.    [provider]  Calcium Carbonate-Vitamin D3 (CALCIUM 600-D) 600-400 MG-UNIT TABS Take 1 tablet by mouth 2 (two) times daily.    [provider]  cetirizine (ZYRTEC) 10 MG tablet Take 10 mg by mouth daily.    [provider]  Cholecalciferol (VITAMIN D) 125 MCG (5000 UT) CAPS Take 5,000 Units by mouth daily.    [provider]  diclofenac sodium (VOLTAREN) 1 % GEL Apply 2 g topically 3 (three) times daily. 01/03/19   [provider]  diltiazem (CARDIZEM CD) 240 MG 24 hr capsule Take 240 mg by mouth daily. 01/11/19   [provider]  diphenhydrAMINE (BENADRYL) 25 MG tablet Take 25 mg by mouth every 6 (six) hours as needed for allergies.    [provider]  escitalopram (LEXAPRO) 10 MG tablet Take 10 mg by mouth at bedtime. 06/08/16   [provider]  ferrous sulfate 325 (65 FE) MG tablet Take 325 mg by mouth 2 (two) times daily with a meal.     [provider]  Fluticasone-Salmeterol (ADVAIR) 100-50 MCG/DOSE AEPB Inhale 1 puff into the lungs 2 (two) times daily.    [provider]  furosemide (LASIX) 20 MG tablet Take 20 mg by mouth daily.    [provider]  guaifenesin (ROBITUSSIN) 100 MG/5ML syrup Take 300 mg by mouth 4 (four) times daily as needed for cough.    [provider]  loperamide (IMODIUM A-D) 2 MG tablet Take 2 mg by mouth daily as needed for diarrhea or loose stools.    [provider]  metolazone (ZAROXOLYN) 5 MG tablet Take 5 mg by mouth daily.    [provider]  pantoprazole (PROTONIX) 40 MG tablet Take 1 tablet (40 mg total) by mouth daily. 05/30/19 05/29/20  Ghimire, Werner Lean, MD  potassium chloride SA  (KLOR-CON) 20 MEQ tablet Take 2 tablets (40 mEq total) by mouth daily. 05/30/19   Ghimire, Werner Lean, MD  predniSONE (DELTASONE) 10 MG tablet Take 40 mg daily for 1 day, 30 mg daily for 1 day, 20 mg daily for 1 days,10 mg daily for 1 day, then stop 05/30/19   Ghimire, Werner Lean, MD  tiZANidine (ZANAFLEX) 4 MG tablet Take 4 mg by mouth daily.    [provider]    Allergies Hydrocodone, Naproxen, Other, Sulfamethoxazole-trimethoprim, and Tramadol  History reviewed. No pertinent family history.  Social History Social History   Tobacco Use  . Smoking status: Never Smoker  . Smokeless tobacco: Never Used  Substance Use Topics  . Alcohol use: Not Currently  . Drug use: Not Currently    Review of Systems Level 5 caveat: Unable to obtain review of systems due to hearing and speech impairment   ____________________________________________   PHYSICAL EXAM:  VITAL SIGNS: ED Triage Vitals  Enc Vitals Group     BP 06/07/19 1624 (!) 148/75     Pulse Rate 06/07/19 1624 96     Resp 06/07/19 1624 20     Temp 06/07/19 1624 98.1 F (36.7 C)     Temp Source 06/07/19 1624 Oral     SpO2 06/07/19 1624 99 %     Weight 06/07/19 1625 105 lb (47.6 kg)     Height 06/07/19 1625 5\' 6"  (1.676 m)     Head Circumference --      Peak Flow --      Pain Score --      Pain Loc --      Pain Edu? --      Excl. in GC? --     Constitutional: Alert, weak but relatively comfortable appearing. Eyes: Conjunctivae are normal.  Head: Atraumatic. Nose: No congestion/rhinnorhea. Mouth/Throat: Mucous membranes are somewhat dry.   Neck: Normal range of motion.  Cardiovascular: Borderline tachycardic, regular rhythm.   Good peripheral circulation. Respiratory: Increased respiratory effort.  No retractions. Lungs CTAB. Gastrointestinal: Soft and nontender. No distention.  Genitourinary: No flank tenderness. Musculoskeletal: No lower extremity edema.  Extremities warm and well perfused.  Neurologic:  Motor intact in all extremities. Skin:  Skin is warm and dry. No rash noted. Psychiatric: Calm and cooperative.  ____________________________________________   LABS (all labs ordered are listed, but only abnormal results are displayed)  Labs Reviewed  CBC WITH DIFFERENTIAL/PLATELET - Abnormal; Notable for the following components:      Result Value   WBC 16.3 (*)    RBC 3.78 (*)    Hemoglobin 11.2 (*)    HCT 34.0 (*)    Neutro Abs 13.5 (*)    Monocytes Absolute 1.2 (*)    Abs Immature Granulocytes 0.13 (*)    All other components within normal limits  BRAIN NATRIURETIC PEPTIDE - Abnormal; Notable for the following components:   B Natriuretic Peptide 111.0 (*)  All other components within normal limits  COMPREHENSIVE METABOLIC PANEL - Abnormal; Notable for the following components:   Glucose, Bld 110 (*)    BUN 38 (*)    Creatinine, Ser 1.02 (*)    GFR calc non Af Amer 53 (*)    All other components within normal limits  FIBRIN DERIVATIVES D-DIMER ([facility] ONLY) - Abnormal; Notable for the following components:   Fibrin derivatives D-dimer ([facility]) 913.58 (*)    All other components within normal limits  FERRITIN - Abnormal; Notable for the following components:   Ferritin 881 (*)    All other components within normal limits  TROPONIN I (HIGH SENSITIVITY) - Abnormal; Notable for the following components:   Troponin I (High Sensitivity) 33 (*)    All other components within normal limits  TROPONIN I (HIGH SENSITIVITY) - Abnormal; Notable for the following components:   Troponin I (High Sensitivity) 31 (*)    All other components within normal limits  CULTURE, BLOOD (ROUTINE X 2)  CULTURE, BLOOD (ROUTINE X 2)  LACTIC ACID, PLASMA  LACTIC ACID, PLASMA  PROCALCITONIN  PHOSPHORUS  MAGNESIUM  URINALYSIS, COMPLETE (UACMP) WITH MICROSCOPIC  C-REACTIVE PROTEIN  CBC WITH DIFFERENTIAL/PLATELET  COMPREHENSIVE METABOLIC PANEL  C-REACTIVE PROTEIN  FIBRIN DERIVATIVES D-DIMER ([facility]  ONLY)  FERRITIN   ____________________________________________  EKG  ED ECG REPORT I, Arta Silence, the attending physician, personally viewed and interpreted this ECG.  Date: 06/07/2019 EKG Time: 1622 Rate: 101 Rhythm: Sinus tachycardia QRS Axis: normal Intervals: Incomplete RBBB, LAFB ST/T Wave abnormalities: Nonspecific T wave abnormalities anteriorly Narrative Interpretation: Nonspecific abnormalities with no evidence of acute ischemia  ____________________________________________  RADIOLOGY  CXR: Persistent bilateral multi lobar opacities  ____________________________________________   PROCEDURES  Procedure(s) performed: No  Procedures  Critical Care performed: Yes  CRITICAL CARE Performed by: Arta Silence   Total critical care time: 35 minutes  Critical care time was exclusive of separately billable procedures and treating other patients.  Critical care was necessary to treat or prevent imminent or life-threatening deterioration.  Critical care was time spent personally by me on the following activities: development of treatment plan with patient and/or surrogate as well as nursing, discussions with consultants, evaluation of patient's response to treatment, examination of patient, obtaining history from patient or surrogate, ordering and performing treatments and interventions, ordering and review of laboratory studies, ordering and review of radiographic studies, pulse oximetry and re-evaluation of patient's condition. ____________________________________________   INITIAL IMPRESSION / ASSESSMENT AND PLAN / ED COURSE  Pertinent labs & imaging results that were available during my care of the patient were reviewed by me and considered in my medical decision making (see chart for details).  79 year old female with PMH as noted above presents with worsening shortness of breath and apparently also chest pain today from her facility, and was found  to be hypoxic to the 80s on her normal 3 L by nasal cannula.  The patient is deaf and mute and does not use sign language.  I reviewed the past medical records in [place].  The patient was admitted earlier this month with respiratory failure due to COVID-19, and discharged on 1/8 after treatment with steroids and remdesivir.  At the time of discharge she was back on her normal 3 L of oxygen.  She was also treated for possible bacterial pneumonia.  Per the inpatient notes, the patient has a public guardian at [facility].  She is apparently full code.  On exam currently, the patient has increased work of  breathing but no acute respiratory distress.  She has an O2 saturation in the mid to high 90s on 5 L by nasal cannula.  Her lungs are clear bilaterally.  The remainder of the exam is as described above.  Differential includes recurrent or worsening bacterial pneumonia, recurrent or worsening symptoms related to COVID-19, pulmonary embolism, pneumothorax, or possible cardiac cause.  We will obtain chest x-ray, lab work-up, and reassess.  ----------------------------------------- 10:26 PM on 06/07/2019 -----------------------------------------  X-ray showed persistent multi lobar bilateral opacities although possibly slightly improved from her prior films.  The patient's inflammatory markers are elevated although mostly downtrending, however her WBC count is trending upward.  Therefore I suspect more likely bacterial pneumonia rather than persistent symptoms related to COVID-19.  Given the increased oxygen requirement, the patient required admission.  I discussed her case with Dr. Cyndia Bent for admission to the hospitalist service. ____________________________________________   FINAL CLINICAL IMPRESSION(S) / ED DIAGNOSES  Final diagnoses:  Acute respiratory failure with hypoxia (HCC)  Healthcare-associated pneumonia      NEW MEDICATIONS STARTED DURING THIS VISIT:  New Prescriptions   No  medications on file     Note:  This document was prepared using Dragon voice recognition software and may include unintentional dictation errors.   Dionne Bucy, MD 06/07/19 2226

## 2019-06-07 NOTE — Progress Notes (Signed)
PHARMACY -  BRIEF ANTIBIOTIC NOTE   Pharmacy has received consult(s) for vancomycin and cefepime from an ED provider.  The patient's profile has been reviewed for ht/wt/allergies/indication/available labs.    One time order(s) placed for vanc 1 g + cefepime 2 g  Further antibiotics/pharmacy consults should be ordered by admitting physician if indicated.                       Thank you,  Pricilla Riffle, PharmD 06/07/2019  7:17 PM

## 2019-06-07 NOTE — H&P (Signed)
History and Physical    Alice Wallace IFO:[id number] DOB: 11-Oct-1940 DOA: 06/07/2019  PCP: Devoria Glassing, NP  Patient coming from: [place]. She has guardian at [facility] Rodena Medin - [phone] per previous notes).  I have personally briefly reviewed patient's old medical records in [facility]  Chief Complaint: Hypoxia  HPI: Alice Wallace is a 79 y.o. female with medical history significant of deaf/mute, COPD chronically on 3L of oxygen, hypertension, osteoporosis, and GERD who presents with hypoxia.  History obtained in its entirety from ED physician report and documentation since patient is deaf/mute and does not do sign language.  Reading and writing was attempted but patient had difficulty understanding words.  She was recently discharged from [facility] on 1/8 for acute on chronic hypoxic respiratory failure due to COVID-19 viral pneumonia and concurrent bacterial pneumonia.  She completed a course of remdesivir on 1/8 and was discharged on tapering steroid.  She was also treated with Rocephin and azithromycin was transitioned to [facility].  She presents today due to hypoxia.  She was found to have oxygen desaturation to 80% on her chronic 3 L and had to be increased to 6 L.  ED Course: She was afebrile, normotensive and had to be placed on 6 L via nasal cannula. WBC of 16.3, hemoglobin of 11.2. Sodium 138, potassium of 3.7, glucose of 110, creatinine of 1.02, lactate of 1.1 BNP of 111, troponin of 33 and then 31 Procalcitonin of 0.16  Chest x-ray showed slight improved aeration in the left midlung but with persistent bilateral multilobar pneumonia.  Severe emphysema with extensive bullous disease in the right upper lobe.  Review of Systems:  Unable to obtain due to patient's mute/deaf status  Past Medical History:  Diagnosis Date  . COPD (chronic obstructive pulmonary disease) (HCC)   . Deaf   . Hypertension   . Osteoporosis     History  reviewed. No pertinent surgical history.   reports that she has never smoked. She has never used smokeless tobacco. She reports previous alcohol use. She reports previous drug use.  Allergies  Allergen Reactions  . Hydrocodone     Other reaction(s): Unknown  . Naproxen     Other reaction(s): Unknown  . Other Other (See Comments)  . Sulfamethoxazole-Trimethoprim     Other reaction(s): Unknown  . Tramadol     Other reaction(s): Unknown Pt has tolerated this med 1/5-1/7 with no issues    Unable to obtain family hx due to mute/deafness  Prior to Admission medications   Medication Sig Start Date End Date Taking? Authorizing Provider  acetaminophen (TYLENOL) 500 MG tablet Take 500 mg by mouth 3 (three) times daily.    [provider]  acetaminophen (TYLENOL) 500 MG tablet Take 500 mg by mouth every 8 (eight) hours as needed for moderate pain or fever.    [provider]  albuterol (PROVENTIL) (2.5 MG/3ML) 0.083% nebulizer solution Take 2.5 mg by nebulization every 6 (six) hours as needed for wheezing or shortness of breath.    [provider]  albuterol (VENTOLIN HFA) 108 (90 Base) MCG/ACT inhaler Inhale 2 puffs into the lungs every 4 (four) hours as needed for wheezing or shortness of breath.    [provider]  alendronate (FOSAMAX) 70 MG tablet Take 70 mg by mouth once a week. Take with a full glass of water on an empty stomach.    [provider]  aluminum-magnesium hydroxide-simethicone (MAALOX) 200-200-20 MG/5ML SUSP Take 30 mLs by  mouth 4 (four) times daily -  before meals and at bedtime.    [provider]  Calcium Carbonate-Vitamin D3 (CALCIUM 600-D) 600-400 MG-UNIT TABS Take 1 tablet by mouth 2 (two) times daily.    [provider]  cetirizine (ZYRTEC) 10 MG tablet Take 10 mg by mouth daily.    [provider]  Cholecalciferol (VITAMIN D) 125 MCG (5000 UT) CAPS Take 5,000 Units by mouth daily.    [provider]  diclofenac sodium (VOLTAREN) 1 % GEL Apply 2 g topically 3 (three) times daily. 01/03/19   [provider]  diltiazem (CARDIZEM CD) 240 MG 24 hr capsule Take 240 mg by mouth daily. 01/11/19   [provider]  diphenhydrAMINE (BENADRYL) 25 MG tablet Take 25 mg by mouth every 6 (six) hours as needed for allergies.    [provider]  escitalopram (LEXAPRO) 10 MG tablet Take 10 mg by mouth at bedtime. 06/08/16   [provider]  ferrous sulfate 325 (65 FE) MG tablet Take 325 mg by mouth 2 (two) times daily with a meal.     [provider]  Fluticasone-Salmeterol (ADVAIR) 100-50 MCG/DOSE AEPB Inhale 1 puff into the lungs 2 (two) times daily.    [provider]  furosemide (LASIX) 20 MG tablet Take 20 mg by mouth daily.    [provider]  guaifenesin (ROBITUSSIN) 100 MG/5ML syrup Take 300 mg by mouth 4 (four) times daily as needed for cough.    [provider]  loperamide (IMODIUM A-D) 2 MG tablet Take 2 mg by mouth daily as needed for diarrhea or loose stools.    [provider]  metolazone (ZAROXOLYN) 5 MG tablet Take 5 mg by mouth daily.    [provider]  pantoprazole (PROTONIX) 40 MG tablet Take 1 tablet (40 mg total) by mouth daily. 05/30/19 05/29/20  Ghimire, Werner Lean, MD  potassium chloride SA (KLOR-CON) 20 MEQ tablet Take 2 tablets (40 mEq total) by mouth daily. 05/30/19   Ghimire, Werner Lean, MD  predniSONE (DELTASONE) 10 MG tablet Take 40 mg daily for 1 day, 30 mg daily for 1 day, 20 mg daily for 1 days,10 mg daily for 1 day, then stop 05/30/19   Ghimire, Werner Lean, MD  tiZANidine (ZANAFLEX) 4 MG tablet Take 4 mg by mouth daily.    [provider]    Physical Exam: Vitals:   06/07/19 1730 06/07/19 1800 06/07/19 1830 06/07/19 1900  BP: (!) 157/70 109/73 113/75 136/81  Pulse: 95 97 98 95  Resp: 17 16 18 16   Temp:      TempSrc:      SpO2: 99% 98% 97% 96%  Weight:      Height:         Constitutional: NAD, calm, comfortable, nontoxic appearing female appearing younger than her age sitting upright in bed Vitals:   06/07/19 1730 06/07/19 1800 06/07/19 1830 06/07/19 1900  BP: (!) 157/70 109/73 113/75 136/81  Pulse: 95 97 98 95  Resp: 17 16 18 16   Temp:      TempSrc:      SpO2: 99% 98% 97% 96%  Weight:      Height:       Eyes: PERRL, lids and conjunctivae normal ENMT: Mucous membranes are moist. Neck: normal, supple Respiratory: Bibasilar rhonchi but no wheezing, no crackles. Normal respiratory effort on 6 L. No accessory muscle use.  Cardiovascular: Regular rate and rhythm, no murmurs / rubs / gallops. No extremity  edema. Abdomen: no tenderness, no masses palpated.  Bowel sounds positive.  Musculoskeletal: no clubbing / cyanosis. No joint deformity upper and lower extremities. Good ROM, no contractures. Normal muscle tone.  Skin: no rashes, lesions, ulcers. No induration Neurologic: CN 2-12 grossly intact. Sensation intact. Strength 5/5 in all 4.  Psychiatric: Alert but unable to assess orientation due to deaf/mute. Normal mood.     Labs on Admission: I have personally reviewed following labs and imaging studies  CBC: Recent Labs  Lab 06/07/19 1631  WBC 16.3*  NEUTROABS 13.5*  HGB 11.2*  HCT 34.0*  MCV 89.9  PLT 166   Basic Metabolic Panel: Recent Labs  Lab 06/07/19 1631  NA 138  K 3.7  CL 98  CO2 28  GLUCOSE 110*  BUN 38*  CREATININE 1.02*  CALCIUM 9.1  MG 1.9  PHOS 2.9   GFR: Estimated Creatinine Clearance: 34.2 mL/min (A) (by C-G formula based on SCr of 1.02 mg/dL (H)). Liver Function Tests: Recent Labs  Lab 06/07/19 1631  AST 19  ALT 14  ALKPHOS 53  BILITOT 0.8  PROT 7.1  ALBUMIN 3.7   No results for input(s): LIPASE, AMYLASE in the last 168 hours. No results for input(s): AMMONIA in the last 168 hours. Coagulation Profile: No results for input(s): INR, PROTIME in the last 168 hours. Cardiac Enzymes: No results for  input(s): CKTOTAL, CKMB, CKMBINDEX, TROPONINI in the last 168 hours. BNP (last 3 results) No results for input(s): PROBNP in the last 8760 hours. HbA1C: No results for input(s): HGBA1C in the last 72 hours. CBG: No results for input(s): GLUCAP in the last 168 hours. Lipid Profile: No results for input(s): CHOL, HDL, LDLCALC, TRIG, CHOLHDL, LDLDIRECT in the last 72 hours. Thyroid Function Tests: No results for input(s): TSH, T4TOTAL, FREET4, T3FREE, THYROIDAB in the last 72 hours. Anemia Panel: Recent Labs    06/07/19 1631  FERRITIN 881*   Urine analysis:    Component Value Date/Time   COLORURINE YELLOW (A) 05/27/2019 0030   APPEARANCEUR CLOUDY (A) 05/27/2019 0030   LABSPEC 1.011 05/27/2019 0030   PHURINE 5.0 05/27/2019 0030   GLUCOSEU NEGATIVE 05/27/2019 0030   HGBUR NEGATIVE 05/27/2019 0030   BILIRUBINUR NEGATIVE 05/27/2019 0030   KETONESUR 5 (A) 05/27/2019 0030   PROTEINUR NEGATIVE 05/27/2019 0030   NITRITE NEGATIVE 05/27/2019 0030   LEUKOCYTESUR LARGE (A) 05/27/2019 0030    Radiological Exams on Admission: DG Chest Portable 1 View  Result Date: 06/07/2019 CLINICAL DATA:  80 year old female with history of recurrent shortness of breath and hypoxia. COVID-19 infection. EXAM: PORTABLE CHEST 1 VIEW COMPARISON:  Chest x-ray 05/28/2019. FINDINGS: Severe emphysematous changes are again noted, with extensive bullous disease in the right upper lobe. Patchy multifocal ill-defined opacities and areas of interstitial prominence are noted elsewhere in the lungs bilaterally (left greater than right). Overall, aeration has improved slightly compared to 05/28/2019, particularly in the left mid lung. No pleural effusions. No evidence of pulmonary edema. Heart size is normal. Upper mediastinal contours are within normal limits. Aortic atherosclerosis. Multiple old healed bilateral rib fractures with posttraumatic deformities in the chest. IMPRESSION: 1. Slight improved aeration, particularly in  the left mid lung, but with persistent bilateral multilobar pneumonia, as above. 2. Severe emphysema with extensive bullous disease in the right upper lobe. 3. Aortic atherosclerosis. Electronically Signed   By: Vinnie Langton M.D.   On: 06/07/2019 17:04    EKG: Independently reviewed.   Assessment/Plan  Acute hypoxic respiratory failure secondary to COVID pneumonia and  questionable superimposed bacterial pneumonia  On 6L. Baseline on 3L for her COPD. Maintain O2 > 92%  Remdesivir- recently completed a course on 1/8 so will not resume IV Decadron  Monitor inflammatory markers PCT low but WBC is elevated which is not typically seen in COVID infections. She did have superimposed pneumonia in last admission. Will continue Vancomycin and cefepime for now and follow WBC trend  Hypokalemia  K of 3.7 replete  AKI Follow creatinine after fluids  Avoid nephrotoxic agents  Communication barrier-Deaf/mute Patient seems to have trouble reading and writing as well   DVT prophylaxis:.Lovenox Code Status: Full Family Communication: Plan discussed with patient at bedside  disposition Plan: facility with at least 2 midnight stays  Consults called:  Admission status: inpatient   Annesha Delgreco T Ketura Sirek DO [facility]   If 7PM-7AM, please contact night-coverage [email or web address] Password [facility]  06/07/2019, 7:24 PM

## 2019-06-07 NOTE — ED Notes (Signed)
Pt was ambulated to the restroom by this RN

## 2019-06-07 NOTE — ED Triage Notes (Signed)
Pt arrives to ED from [facility] for SOB. Pt normally wears 3L [place]. Was hypoxic in 80's with EMS so bumped to 6L. Upon arrival this RN checked sats on 3 L [place] and was found to be 80%. Placed back up to 6L [place] and came up to 98%. Pt recently admitted for COVID. Pt is deaf.

## 2019-06-07 NOTE — ED Notes (Signed)
Pt provided water to drink, ok per EDP

## 2019-06-08 ENCOUNTER — Encounter: Payer: Self-pay | Admitting: Family Medicine

## 2019-06-08 LAB — CBC WITH DIFFERENTIAL/PLATELET
Abs Immature Granulocytes: 0.08 10*3/uL — ABNORMAL HIGH (ref 0.00–0.07)
Basophils Absolute: 0 10*3/uL (ref 0.0–0.1)
Basophils Relative: 0 %
Eosinophils Absolute: 0 10*3/uL (ref 0.0–0.5)
Eosinophils Relative: 0 %
HCT: 32.7 % — ABNORMAL LOW (ref 36.0–46.0)
Hemoglobin: 10.5 g/dL — ABNORMAL LOW (ref 12.0–15.0)
Immature Granulocytes: 1 %
Lymphocytes Relative: 4 %
Lymphs Abs: 0.5 10*3/uL — ABNORMAL LOW (ref 0.7–4.0)
MCH: 29.2 pg (ref 26.0–34.0)
MCHC: 32.1 g/dL (ref 30.0–36.0)
MCV: 90.8 fL (ref 80.0–100.0)
Monocytes Absolute: 0.1 10*3/uL (ref 0.1–1.0)
Monocytes Relative: 1 %
Neutro Abs: 10.9 10*3/uL — ABNORMAL HIGH (ref 1.7–7.7)
Neutrophils Relative %: 94 %
Platelets: 343 10*3/uL (ref 150–400)
RBC: 3.6 MIL/uL — ABNORMAL LOW (ref 3.87–5.11)
RDW: 14.2 % (ref 11.5–15.5)
WBC: 11.5 10*3/uL — ABNORMAL HIGH (ref 4.0–10.5)
nRBC: 0 % (ref 0.0–0.2)

## 2019-06-08 LAB — URINALYSIS, COMPLETE (UACMP) WITH MICROSCOPIC
Bacteria, UA: NONE SEEN
Bilirubin Urine: NEGATIVE
Glucose, UA: NEGATIVE mg/dL
Ketones, ur: 5 mg/dL — AB
Nitrite: NEGATIVE
Protein, ur: NEGATIVE mg/dL
Specific Gravity, Urine: 1.019 (ref 1.005–1.030)
pH: 5 (ref 5.0–8.0)

## 2019-06-08 LAB — EXPECTORATED SPUTUM ASSESSMENT W GRAM STAIN, RFLX TO RESP C: Special Requests: NORMAL

## 2019-06-08 LAB — COMPREHENSIVE METABOLIC PANEL
ALT: 15 U/L (ref 0–44)
AST: 18 U/L (ref 15–41)
Albumin: 3.6 g/dL (ref 3.5–5.0)
Alkaline Phosphatase: 49 U/L (ref 38–126)
Anion gap: 9 (ref 5–15)
BUN: 34 mg/dL — ABNORMAL HIGH (ref 8–23)
CO2: 28 mmol/L (ref 22–32)
Calcium: 8.8 mg/dL — ABNORMAL LOW (ref 8.9–10.3)
Chloride: 100 mmol/L (ref 98–111)
Creatinine, Ser: 0.81 mg/dL (ref 0.44–1.00)
GFR calc Af Amer: >60 mL/min (ref >60)
GFR calc non Af Amer: >60 mL/min (ref >60)
Glucose, Bld: 140 mg/dL — ABNORMAL HIGH (ref 70–99)
Potassium: 3.7 mmol/L (ref 3.5–5.1)
Sodium: 137 mmol/L (ref 135–145)
Total Bilirubin: 0.7 mg/dL (ref 0.3–1.2)
Total Protein: 6.9 g/dL (ref 6.5–8.1)

## 2019-06-08 LAB — FIBRIN DERIVATIVES D-DIMER (ARMC ONLY): Fibrin derivatives D-dimer (ARMC): 974.84 ng/mL (FEU) — ABNORMAL HIGH (ref 0.00–499.00)

## 2019-06-08 LAB — PROCALCITONIN: Procalcitonin: 0.17 ng/mL

## 2019-06-08 LAB — C-REACTIVE PROTEIN
CRP: 5.3 mg/dL — ABNORMAL HIGH (ref <1.0)
CRP: 3.9 mg/dL — ABNORMAL HIGH (ref <1.0)

## 2019-06-08 LAB — FERRITIN: Ferritin: 696 ng/mL — ABNORMAL HIGH (ref 11–307)

## 2019-06-08 LAB — MRSA PCR SCREENING: MRSA by PCR: POSITIVE — AB

## 2019-06-08 MED ORDER — SODIUM CHLORIDE 0.9 % IV SOLN
2.0000 g | Freq: Two times a day (BID) | INTRAVENOUS | Status: DC
  Administered 2019-06-08 – 2019-06-09 (×3): 2 g via INTRAVENOUS
  Filled 2019-06-08 (×4): qty 2

## 2019-06-08 MED ORDER — VANCOMYCIN HCL 750 MG/150ML IV SOLN
750.0000 mg | INTRAVENOUS | Status: DC
  Administered 2019-06-08: 18:00:00 750 mg via INTRAVENOUS
  Filled 2019-06-08 (×2): qty 150

## 2019-06-08 NOTE — Progress Notes (Signed)
Pharmacy Antibiotic Note  Alice Wallace is a 79 y.o. female admitted on 06/07/2019 with pneumonia.  Pharmacy has been consulted for vanc/cefepime dosing. Patient received vanc 1g and cefepime 2g IV x 1 in the ED.  Plan: Vancomycin 750 mg IV Q 24 hrs. Goal AUC 400-550. Expected AUC: 551.3 SCr used: 1.02 (b/l 0.7 - 0.8) Cssmin: 14.3  Will continue cefepime 2g IV q12h per CrCl 30 - 60 ml/min and will continue to monitor and adjust as needed.  Height: 5\' 6"  (167.6 cm) Weight: 105 lb (47.6 kg) IBW/kg (Calculated) : 59.3  Temp (24hrs), Avg:97.5 F (36.4 C), Min:96.8 F (36 C), Max:98.1 F (36.7 C)  Recent Labs  Lab 06/07/19 1631 06/07/19 1829 06/08/19 0633  WBC 16.3*  --  11.5*  CREATININE 1.02*  --   --   LATICACIDVEN 1.1 1.1  --     Estimated Creatinine Clearance: 34.2 mL/min (A) (by C-G formula based on SCr of 1.02 mg/dL (H)).    Allergies  Allergen Reactions  . Hydrocodone     Other reaction(s): Unknown  . Naproxen     Other reaction(s): Unknown  . Other Other (See Comments)    nuts  . Sulfamethoxazole-Trimethoprim     Other reaction(s): Unknown  . Tramadol     Other reaction(s): Unknown Pt has tolerated this med 1/5-1/7 with no issues    Thank you for allowing pharmacy to be a part of this patient's care.  12-09-1985, PharmD, BCPS Clinical Pharmacist 06/08/2019 7:03 AM

## 2019-06-08 NOTE — ED Notes (Signed)
This RN to bedside at this time, pt resting in bed at this time, pt given emesis bag due to patient coughing up thick white phlegm. Sputum culture obtained by this RN and admitting MD notified that specimen obtained, awaiting orders. This RN explained delay to patient at this time. Denies any needs.

## 2019-06-08 NOTE — ED Notes (Signed)
This RN and Animator at bedside, medication administered per MD order. Pt making motions, this RN communicating through writing with patient, pt however refusing to write back, turning away from this RN when this RN explaining through written communication that this RN did not understand gestures patient making. Per Trinna Post, RN pt did same for prior to this RN coming on shift. Pt appears reasonably comfortable in bed, given remote by Trinna Post, RN. VSS, pt remains on [place] at this time.

## 2019-06-08 NOTE — ED Notes (Signed)
Pt lying in bed, clean and dry with no acute distress noted. Nothing needed from staff at this time but will continue to monitor

## 2019-06-08 NOTE — Progress Notes (Signed)
Report Called to RN on 2A. Patient off unit and transferred to 2A.

## 2019-06-08 NOTE — ED Notes (Signed)
Pt lying in bed with eyes closed and equal unlabored RR. No acute distress noted at this time. Will continue to monitor

## 2019-06-08 NOTE — ED Notes (Signed)
Date and time results received: 06/08/19 1851   Test: MRSA Critical Value: positive  Name of Provider Notified: Dr. Allena Katz

## 2019-06-08 NOTE — ED Notes (Signed)
Pt provided 2 cups of applesauce after making a notion that she couldn't eat supper tray provided. Pt drank juice.

## 2019-06-08 NOTE — Progress Notes (Signed)
[facility]  - [place] at [facility]   PATIENT NAME: Alice Wallace    MR#:  [id number]  DATE OF BIRTH:  1940/08/22  SUBJECTIVE:  patient is deaf and mute. History is obtained from she keeps pointing at her buttock.  REVIEW OF SYSTEMS:   Review of Systems  Unable to perform ROS: Language   Tolerating Diet: Tolerating PT:   DRUG ALLERGIES:   Allergies  Allergen Reactions  . Hydrocodone     Other reaction(s): Unknown  . Naproxen     Other reaction(s): Unknown  . Other Other (See Comments)    nuts  . Sulfamethoxazole-Trimethoprim     Other reaction(s): Unknown  . Tramadol     Other reaction(s): Unknown Pt has tolerated this med 1/5-1/7 with no issues    VITALS:  Blood pressure (!) 146/67, pulse 75, temperature (!) 96.8 F (36 C), temperature source Axillary, resp. rate 20, height 5\' 6"  (1.676 m), weight 47.6 kg, SpO2 100 %.  PHYSICAL EXAMINATION:   Physical Exam  GENERAL:  79 y.o.-year-old patient lying in the bed with no acute distress. Thin EYES: Pupils equal, round, reactive to light and accommodation. No scleral icterus.   HEENT: Head atraumatic, normocephalic. Oropharynx and nasopharynx clear.  NECK:  Supple, no jugular venous distention. No thyroid enlargement, no tenderness.  LUNGS: Normal breath sounds bilaterally, no wheezing, rales, rhonchi. No use of accessory muscles of respiration.  CARDIOVASCULAR: S1, S2 normal. No murmurs, rubs, or gallops.  ABDOMEN: Soft, nontender, nondistended. Bowel sounds present. No organomegaly or mass.  EXTREMITIES: No cyanosis, clubbing or edema b/l.    NEUROLOGIC: moves all extremities well. PSYCHIATRIC:  patient is alert, deaf and mute SKIN: per RN Pressure Injury 05/27/19 Ischial tuberosity Left Unstageable - Full thickness tissue loss in which the base of the injury is covered by slough (yellow, tan, gray, green or brown) and/or eschar (tan, brown or black) in the wound bed. (Active)  05/27/19 1630   Location: Ischial tuberosity  Location Orientation: Left  Staging: Unstageable - Full thickness tissue loss in which the base of the injury is covered by slough (yellow, tan, gray, green or brown) and/or eschar (tan, brown or black) in the wound bed.  Wound Description (Comments):   Present on Admission: Yes      LABORATORY PANEL:  CBC Recent Labs  Lab 06/08/19 0633  WBC 11.5*  HGB 10.5*  HCT 32.7*  PLT 343    Chemistries  Recent Labs  Lab 06/07/19 1631 06/07/19 1631 06/08/19 0633  NA 138   < > 137  K 3.7   < > 3.7  CL 98   < > 100  CO2 28   < > 28  GLUCOSE 110*   < > 140*  BUN 38*   < > 34*  CREATININE 1.02*   < > 0.81  CALCIUM 9.1   < > 8.8*  MG 1.9  --   --   AST 19   < > 18  ALT 14   < > 15  ALKPHOS 53   < > 49  BILITOT 0.8   < > 0.7   < > = values in this interval not displayed.   Cardiac Enzymes No results for input(s): TROPONINI in the last 168 hours. RADIOLOGY:  DG Chest Portable 1 View  Result Date: 06/07/2019 CLINICAL DATA:  79 year old female with history of recurrent shortness of breath and hypoxia. COVID-19 infection. EXAM: PORTABLE CHEST 1 VIEW COMPARISON:  Chest x-ray 05/28/2019. FINDINGS: Severe  emphysematous changes are again noted, with extensive bullous disease in the right upper lobe. Patchy multifocal ill-defined opacities and areas of interstitial prominence are noted elsewhere in the lungs bilaterally (left greater than right). Overall, aeration has improved slightly compared to 05/28/2019, particularly in the left mid lung. No pleural effusions. No evidence of pulmonary edema. Heart size is normal. Upper mediastinal contours are within normal limits. Aortic atherosclerosis. Multiple old healed bilateral rib fractures with posttraumatic deformities in the chest. IMPRESSION: 1. Slight improved aeration, particularly in the left mid lung, but with persistent bilateral multilobar pneumonia, as above. 2. Severe emphysema with extensive bullous  disease in the right upper lobe. 3. Aortic atherosclerosis. Electronically Signed   By: Vinnie Langton M.D.   On: 06/07/2019 17:04   ASSESSMENT AND PLAN:   Alice Wallace is a 79 y.o. female with medical history significant of deaf/mute, COPD chronically on 3L of oxygen, hypertension, osteoporosis, and GERD who presents with hypoxia.  History obtained in its entirety from  documentation and RN since patient is deaf/mute and does not do sign language.  Reading and writing was attempted but patient had difficulty understanding words.  # Acute hypoxic respiratory failure secondary to COVID pneumonia and questionable superimposed bacterial pneumonia with history of severe bolus emphysema -On 5L.--currently stas 100%--wean to  Baseline on 3L for her COPD. -Maintain O2 > 90%  -Remdesivir- recently completed a course on 1/8 so will not resume -IV Decadron  -Monitor inflammatory markers -PCT low but WBC is elevated which is not typically seen in COVID infections. She did have superimposed pneumonia in last admission. Will continue Vancomycin and cefepime for now and follow WBC trend -check MRSA pCR -ID consult for ?abx  Hypokalemia  K of 3.3-->3.7 repleted  AKI--pre-renal Follow creatinine after fluids 1.02--0.81 Avoid nephrotoxic agents  Communication barrier-Deaf/mute Patient seems to have trouble reading and writing as well  RN skin documentation--prior to adimssion Pressure Injury 05/27/19 Ischial tuberosity Left Unstageable - Full thickness tissue loss in which the base of the injury is covered by slough (yellow, tan, gray, green or brown) and/or eschar (tan, brown or black) in the wound bed. (Active)  05/27/19 1630  Location: Ischial tuberosity  Location Orientation: Left  Staging: Unstageable - Full thickness tissue loss in which the base of the injury is covered by slough (yellow, tan, gray, green or brown) and/or eschar (tan, brown or black) in the wound bed.  Wound Description  (Comments):   Present on Admission: Yes      DVT prophylaxis:.Lovenox Code Status: Full Family Communication: pt has legal guardian at [place] - [phone] per previous notes). disposition Plan: [facility] Consults called: TOC  Admission status: inpatient    TOTAL TIME TAKING CARE OF THIS PATIENT: *25* minutes.  >50% time spent on counselling and coordination of care  Note: This dictation was prepared with Dragon dictation along with smaller phrase technology. Any transcriptional errors that result from this process are unintentional.  Fritzi Mandes M.D on 06/08/2019 at 1:54 PM    After 6pm go to [email or web address]  [facility]   CC: Primary care physician; Odessa Fleming, NPPatient ID: Alice Wallace, female   DOB: 21-Oct-1940, 79 y.o.   MRN: [id number]

## 2019-06-08 NOTE — ED Notes (Signed)
Pt cleaned of incontinence by Trinna Post, RN. Trinna Post, RN attempted to call report to 2A at this time, per Charge RN, unable to take patient at this time due to staffing.

## 2019-06-08 NOTE — ED Notes (Signed)
Pt lying in bed asleep (eyes closed with equal unlabored RR)  and nothing needed from staff at this time. Will continue to monitor

## 2019-06-09 ENCOUNTER — Other Ambulatory Visit: Payer: Self-pay

## 2019-06-09 ENCOUNTER — Encounter: Payer: Self-pay | Admitting: Family Medicine

## 2019-06-09 ENCOUNTER — Inpatient Hospital Stay: Payer: Medicare Other

## 2019-06-09 MED ORDER — ADULT MULTIVITAMIN W/MINERALS CH
1.0000 | ORAL_TABLET | Freq: Every day | ORAL | Status: DC
  Administered 2019-06-10: 1 via ORAL
  Filled 2019-06-09: qty 1

## 2019-06-09 MED ORDER — FERROUS SULFATE 325 (65 FE) MG PO TABS
325.0000 mg | ORAL_TABLET | Freq: Two times a day (BID) | ORAL | Status: DC
  Administered 2019-06-09 – 2019-06-10 (×2): 325 mg via ORAL
  Filled 2019-06-09 (×2): qty 1

## 2019-06-09 MED ORDER — COLLAGENASE 250 UNIT/GM EX OINT
TOPICAL_OINTMENT | Freq: Every day | CUTANEOUS | Status: DC
  Administered 2019-06-10: 1 via TOPICAL
  Filled 2019-06-09: qty 30

## 2019-06-09 MED ORDER — ALBUTEROL SULFATE HFA 108 (90 BASE) MCG/ACT IN AERS
2.0000 | INHALATION_SPRAY | Freq: Four times a day (QID) | RESPIRATORY_TRACT | Status: DC | PRN
  Filled 2019-06-09: qty 6.7

## 2019-06-09 MED ORDER — ESCITALOPRAM OXALATE 10 MG PO TABS
10.0000 mg | ORAL_TABLET | Freq: Every day | ORAL | Status: DC
  Administered 2019-06-09: 10 mg via ORAL
  Filled 2019-06-09 (×2): qty 1

## 2019-06-09 MED ORDER — AZITHROMYCIN 250 MG PO TABS
250.0000 mg | ORAL_TABLET | Freq: Every day | ORAL | Status: DC
  Administered 2019-06-10: 09:00:00 250 mg via ORAL
  Filled 2019-06-09: qty 1

## 2019-06-09 MED ORDER — ASCORBIC ACID 500 MG PO TABS
500.0000 mg | ORAL_TABLET | Freq: Two times a day (BID) | ORAL | Status: DC
  Administered 2019-06-09 – 2019-06-10 (×2): 500 mg via ORAL
  Filled 2019-06-09 (×2): qty 1

## 2019-06-09 MED ORDER — TIZANIDINE HCL 4 MG PO TABS
4.0000 mg | ORAL_TABLET | Freq: Every day | ORAL | Status: DC
  Administered 2019-06-09: 4 mg via ORAL
  Filled 2019-06-09 (×2): qty 1

## 2019-06-09 MED ORDER — ENSURE ENLIVE PO LIQD
237.0000 mL | Freq: Three times a day (TID) | ORAL | Status: DC
  Administered 2019-06-09 – 2019-06-10 (×2): 237 mL via ORAL

## 2019-06-09 MED ORDER — PREDNISONE 20 MG PO TABS
30.0000 mg | ORAL_TABLET | Freq: Every day | ORAL | Status: DC
  Administered 2019-06-10: 30 mg via ORAL
  Filled 2019-06-09: qty 1

## 2019-06-09 MED ORDER — IOHEXOL 350 MG/ML SOLN
75.0000 mL | Freq: Once | INTRAVENOUS | Status: AC | PRN
  Administered 2019-06-09: 75 mL via INTRAVENOUS

## 2019-06-09 MED ORDER — DILTIAZEM HCL ER COATED BEADS 120 MG PO CP24
240.0000 mg | ORAL_CAPSULE | Freq: Every day | ORAL | Status: DC
  Administered 2019-06-09 – 2019-06-10 (×2): 240 mg via ORAL
  Filled 2019-06-09 (×2): qty 2

## 2019-06-09 NOTE — Consult Note (Signed)
WOC Nurse Consult Note: Reason for Consult: pressure injury Wound type: Unstageable pressure injury Pressure Injury POA: Yes Measurement:4cm x 3.5cm x 0.2cm  Wound bed:50% green/yellow slough/50% pale pink, non granular Drainage (amount, consistency, odor) moderate with odor  Periwound: intact, some epibole of the wound edges; appears to be chronic in nature Dressing procedure/placement/frequency: Enzymatic debridement ointment to the left trochanter wound, top with saline moist gauze, foam.  Reapply SAntyl and change gauze daily. RD for wound healing.  Patient can turn herself.   Discussed POC with patient and bedside nurse.  Re consult if needed, will not follow at this time. Thanks  Birdia Jaycox [employer], RN,CWOCN, CNS, CWON-AP [phone])

## 2019-06-09 NOTE — Consult Note (Signed)
Pulmonary Medicine          Date: 06/09/2019,   MRN# [id number] Alice Wallace 11-12-1940     AdmissionWeight: 47.6 kg                 CurrentWeight: 55.9 kg  Referring physician: Dr. Posey Pronto    CHIEF COMPLAINT:  Abnormal chest imaging post COVID-19 pneumonia with possible need for further treatment   HISTORY OF PRESENT ILLNESS   This is 79 year old female with a history of advanced COPD with chronic hypoxemia dependent on oxygen at 3 L/min nasal cannula at home where she lives in facility, essential hypertension, complete hearing loss bilaterally, osteoporosis, GERD, status post hospitalization at [facility] for acute on chronic hypoxemic respiratory failure post COVID-19 pneumonia with intercurrent community-acquired bacterial pneumonia status post complete treatment with remdesivir, steroids as well as Rocephin and Zithromax and was discharged May 30, 2019 came back to [facility] on January 16 with complaints of dyspnea and noted hypoxemia to 80s SPO2 despite using 3 L/min nasal cannula which is her baseline and required increased O2 at 6 L to reach 90%.  On admission her blood work was essentially unremarkable with mildly elevated leukocytosis likely due to demargination post glucocorticoids BNP pro calcitonin and troponin were all within reference range.  Serial chest imaging reviewed independently shows x-ray on January 16 with improved consolidation on left lower lobe with a background of bullous emphysematous changes worse in the upper lung zones bilaterally.   PAST MEDICAL HISTORY   Past Medical History:  Diagnosis Date  . COPD (chronic obstructive pulmonary disease) ([place])   . Deaf   . Hypertension   . Osteoporosis      SURGICAL HISTORY   History reviewed. No pertinent surgical history.   FAMILY HISTORY   History reviewed. No pertinent family history.   SOCIAL HISTORY   Social History   Tobacco Use  . Smoking status: Never Smoker  . Smokeless  tobacco: Never Used  Substance Use Topics  . Alcohol use: Not Currently  . Drug use: Not Currently     MEDICATIONS    Home Medication:    Current Medication:  Current Facility-Administered Medications:  .  dexamethasone (DECADRON) injection 6 mg, 6 mg, Intravenous, Q24H, Tu, Ching T, DO, 6 mg at 06/08/19 2119 .  enoxaparin (LOVENOX) injection 40 mg, 40 mg, Subcutaneous, Q24H, Tu, Ching T, DO, 40 mg at 06/08/19 2119    ALLERGIES   Hydrocodone, Naproxen, Other, Sulfamethoxazole-trimethoprim, and Tramadol     REVIEW OF SYSTEMS    Review of Systems:  Gen:  Denies  fever, sweats, chills weigh loss  HEENT: Denies blurred vision, double vision, ear pain, eye pain, hearing loss, nose bleeds, sore throat Cardiac:  No dizziness, chest pain or heaviness, chest tightness,edema Resp:   Denies cough or sputum porduction, shortness of breath,wheezing, hemoptysis,  Gi: Denies swallowing difficulty, stomach pain, nausea or vomiting, diarrhea, constipation, bowel incontinence Gu:  Denies bladder incontinence, burning urine Ext:   Denies Joint pain, stiffness or swelling Skin: Denies  skin rash, easy bruising or bleeding or hives Endoc:  Denies polyuria, polydipsia , polyphagia or weight change Psych:   Denies depression, insomnia or hallucinations   Other:  All other systems negative   VS: BP 137/60 (BP Location: Right Arm)   Pulse 80   Temp 97.6 F (36.4 C) (Oral)   Resp 19   Ht 5\' 6"  (1.676 m)   Wt 55.9 kg   SpO2 100%  BMI 19.89 kg/m      PHYSICAL EXAM    GENERAL:NAD, no fevers, chills, no weakness no fatigue HEAD: Normocephalic, atraumatic.  EYES: Pupils equal, round, reactive to light. Extraocular muscles intact. No scleral icterus.  MOUTH: Moist mucosal membrane. Dentition intact. No abscess noted.  EAR, NOSE, THROAT: Clear without exudates. No external lesions.  NECK: Supple. No thyromegaly. No nodules. No JVD.  PULMONARY: Diffuse coarse rhonchi right sided  +wheezes CARDIOVASCULAR: S1 and S2. Regular rate and rhythm. No murmurs, rubs, or gallops. No edema. Pedal pulses 2+ bilaterally.  GASTROINTESTINAL: Soft, nontender, nondistended. No masses. Positive bowel sounds. No hepatosplenomegaly.  MUSCULOSKELETAL: No swelling, clubbing, or edema. Range of motion full in all extremities.  NEUROLOGIC: Cranial nerves II through XII are intact. No gross focal neurological deficits. Sensation intact. Reflexes intact.  SKIN: No ulceration, lesions, rashes, or cyanosis. Skin warm and dry. Turgor intact.  PSYCHIATRIC: Mood, affect within normal limits. The patient is awake, alert and oriented x 3. Insight, judgment intact.       IMAGING    US RENAL  Result Date: 05/28/2019 CLINICAL DATA:  Acute kidney injury. EXAM: RENAL / URINARY TRACT ULTRASOUND COMPLETE COMPARISON:  None. FINDINGS: Right Kidney: Renal measurements: 10.4 x 4.2 x 4.3 cm = volume: 97.5 mL . Echogenicity within normal limits. No mass or hydronephrosis visualized. Left Kidney: Renal measurements: 10.3 x 5.5 x 5.9 cm = volume: 176 mL. Echogenicity within normal limits. No mass or hydronephrosis visualized. Bladder: Appears normal for degree of bladder distention. Other: None. IMPRESSION: Normal exam. Electronically Signed   By: Francene Boyers M.D.   On: 05/28/2019 09:39   DG Chest Portable 1 View  Result Date: 06/07/2019 CLINICAL DATA:  79 year old female with history of recurrent shortness of breath and hypoxia. COVID-19 infection. EXAM: PORTABLE CHEST 1 VIEW COMPARISON:  Chest x-ray 05/28/2019. FINDINGS: Severe emphysematous changes are again noted, with extensive bullous disease in the right upper lobe. Patchy multifocal ill-defined opacities and areas of interstitial prominence are noted elsewhere in the lungs bilaterally (left greater than right). Overall, aeration has improved slightly compared to 05/28/2019, particularly in the left mid lung. No pleural effusions. No evidence of pulmonary  edema. Heart size is normal. Upper mediastinal contours are within normal limits. Aortic atherosclerosis. Multiple old healed bilateral rib fractures with posttraumatic deformities in the chest. IMPRESSION: 1. Slight improved aeration, particularly in the left mid lung, but with persistent bilateral multilobar pneumonia, as above. 2. Severe emphysema with extensive bullous disease in the right upper lobe. 3. Aortic atherosclerosis. Electronically Signed   By: Trudie Reed M.D.   On: 06/07/2019 17:04   DG CHEST PORT 1 VIEW  Result Date: 05/28/2019 CLINICAL DATA:  Hypoxia EXAM: PORTABLE CHEST 1 VIEW COMPARISON:  05/27/2019, 01/17/2019, 04/26/2017 FINDINGS: Unchanged AP portable examination with heterogeneous airspace opacity of the left lung base and severe underlying emphysema. No new airspace opacity. Numerous fracture and resection deformities of the ribs. The heart and mediastinum are unremarkable. IMPRESSION: Unchanged AP portable examination with heterogeneous airspace opacity of the left lung base, suspicious for infection or aspiration although there is some degree of underlying chronic interstitial scarring. Electronically Signed   By: Lauralyn Primes M.D.   On: 05/28/2019 08:33   DG Chest Port 1 View  Result Date: 05/27/2019 CLINICAL DATA:  79 year old female with respiratory distress. EXAM: PORTABLE CHEST 1 VIEW COMPARISON:  Chest radiograph dated 01/17/2019 and CT dated 11/21/2018. FINDINGS: Emphysema. Bilateral mid to lower lung field interstitial coarsening as well as hazy  airspace density primarily involving the left lung base, likely chronic. Developing infiltrate is less likely. Clinical correlation is recommended. No pleural effusion or pneumothorax. Right upper lobe calcified nodules again noted. Stable cardiac silhouette. Atherosclerotic calcification of the aorta. No acute osseous pathology. IMPRESSION: 1. Left lower lung field density, likely chronic. Developing infiltrate is less likely  but not excluded. Clinical correlation is recommended. 2. Advanced emphysema. Electronically Signed   By: Elgie Collard M.D.   On: 05/27/2019 00:41        ASSESSMENT/PLAN   Acute on chronic hypoxemic respiratory failure  - post COVID 19 with background of severe COPD with bullous emphysema and bronchiectasis  - improved CXR from initial illness  - likely due to inflammatory changes post covid  - patient is not clinically apprearing toxic, with stable vitals and trending down inflammatory biomarkers - would recommend 10 more days of prednisone starting with 30mg  and tapering down, with zithromax 250mg  during this time.    Bullous emphysema with advanced COPD and chronic hypoxemia - continue COPD care path -as above with pred/zithromax  -duoneb q6h while awake, please d/c with ICS/LABA/LAMA -can follow up with me post hospitalization within 7 days of /dc    Multiple comorbid conditions - patient with advanced age and multiple significant comorbid conditions as listed by primary attending - these conditions add level of complexity to patient care and overall significantly decrease her prognosis    Thank you for allowing me to participate in the care of this patient.   Patient/Family are satisfied with care plan and all questions have been answered.  This document was prepared using Dragon voice recognition software and may include unintentional dictation errors.     , M.D.  Division of Pulmonary & Critical Care Medicine  [facility]

## 2019-06-09 NOTE — Plan of Care (Signed)
  Problem: Education: Goal: Knowledge of General Education information will improve Description: Including pain rating scale, medication(s)/side effects and non-pharmacologic comfort measures Outcome: Not Progressing Note: Patient is deaf, mute, and doesn't clearly understand written text, or at least not reliably. Appears to be chronic per physician's notes. Will continue to monitor overall status for the remainder of the shift. Alice Wallace [facility]

## 2019-06-09 NOTE — NC FL2 (Signed)
  [place] MEDICAID FL2 LEVEL OF CARE SCREENING TOOL     IDENTIFICATION  Patient Name: Alice Kuntzman Birthdate: 1941/02/06 Sex: female Admission Date (Current Location): 06/07/2019  [facility] and [place] Number:  Chiropodist and Address:  [facility], [address] [place], [place] [address]      Provider Number: [id number]  Attending Physician Name and Address:  Enedina Finner, MD  Relative Name and Phone Number:       Current Level of Care: Hospital Recommended Level of Care: Assisted Living Facility Prior Approval Number:    Date Approved/Denied:   PASRR Number:    Discharge Plan: (ALF)    Current Diagnoses: Patient Active Problem List   Diagnosis Date Noted  . Pneumonia due to COVID-19 virus 06/07/2019  . Hypokalemia 06/07/2019  . AKI (acute kidney injury) (HCC) 06/07/2019  . Nonspeaking deaf 06/07/2019  . Acute hypoxemic respiratory failure due to COVID-19 (HCC) 05/27/2019  . COVID-19 virus infection 05/27/2019    Orientation RESPIRATION BLADDER Height & Weight     Self, Place, Situation  Normal External catheter, Incontinent(placed 06/08/19) Weight: 123 lb 3.8 oz (55.9 kg) Height:  5\' 6"  (167.6 cm)  BEHAVIORAL SYMPTOMS/MOOD NEUROLOGICAL BOWEL NUTRITION STATUS      Continent Diet(heart healthy)  AMBULATORY STATUS COMMUNICATION OF NEEDS Skin   Limited Assist Verbally PU Stage and Appropriate Care(Pressure Injury, left Ischial tuberosity, unstagable)                       Personal Care Assistance Level of Assistance  Bathing, Feeding, Dressing Bathing Assistance: Limited assistance Feeding assistance: Independent Dressing Assistance: Limited assistance     Functional Limitations Info  Sight, Hearing, Speech Sight Info: Adequate Hearing Info: Adequate Speech Info: Adequate    SPECIAL CARE FACTORS FREQUENCY  PT (By licensed PT), OT (By licensed OT)     PT Frequency: 2x OT Frequency: 2x             Contractures Contractures Info: Not present    Additional Factors Info  Code Status, Isolation Precautions, Allergies Code Status Info: Full Code Allergies Info: Hydrocodone, Naproxen, Other, Sulfamethoxazole-trimethoprim, Tramadol     Isolation Precautions Info: COVID +     Current Medications (06/09/2019):  This is the current hospital active medication list Current Facility-Administered Medications  Medication Dose Route Frequency Provider Last Rate Last Admin  . albuterol (VENTOLIN HFA) 108 (90 Base) MCG/ACT inhaler 2 puff  2 puff Inhalation Q6H PRN 06/11/2019, MD      . dexamethasone (DECADRON) injection 6 mg  6 mg Intravenous Q24H Tu, Ching T, DO   6 mg at 06/08/19 2119  . diltiazem (CARDIZEM CD) 24 hr capsule 240 mg  240 mg Oral Daily 2120, MD   240 mg at 06/09/19 1006  . enoxaparin (LOVENOX) injection 40 mg  40 mg Subcutaneous Q24H Tu, Ching T, DO   40 mg at 06/08/19 2119  . escitalopram (LEXAPRO) tablet 10 mg  10 mg Oral QHS 2120, MD      . ferrous sulfate tablet 325 mg  325 mg Oral BID WC Enedina Finner, MD      . tiZANidine (ZANAFLEX) tablet 4 mg  4 mg Oral QHS Enedina Finner, MD         Discharge Medications: Please see discharge summary for a list of discharge medications.  Relevant Imaging Results:  Relevant Lab Results:   Additional Information SSN:[id number]  09-06-2006 Jaziyah Gradel, LCSW

## 2019-06-09 NOTE — Progress Notes (Signed)
Patient appears to be completely deaf. Attempted to use the interpreter on a stick to communicate with patient, both American Sign Language interpreters state that apparently patient doesn't know / understand ASL. Thus cannot use their services (or obviously any other spoken language). Will inform physician. Jari Favre [facility]

## 2019-06-09 NOTE — Progress Notes (Signed)
Wound care completed at this time, as ordered. Patient tolerated well. Timed, dated, and initialed dressing. Will pass along in shift report. Patient clapped hands when finished. Will continue to monitor wound dressing status for the remainder of the shift. Jari Favre [facility]

## 2019-06-09 NOTE — Progress Notes (Signed)
[facility]  - [place] [facility]   PATIENT NAME: Alice Wallace    MR#:  [id number]  DATE OF BIRTH:  January 18, 1941  SUBJECTIVE:  patient is deaf and mute. Resting comfortably. No respiratory distress. No new issues per RN noted. Difficulty communicating with the sign language interpreter as well  REVIEW OF SYSTEMS:   Review of Systems  Unable to perform ROS: Language   Tolerating Diet: Tolerating PT:   DRUG ALLERGIES:   Allergies  Allergen Reactions  . Hydrocodone     Other reaction(s): Unknown  . Naproxen     Other reaction(s): Unknown  . Other Other (See Comments)    nuts  . Sulfamethoxazole-Trimethoprim     Other reaction(s): Unknown  . Tramadol     Other reaction(s): Unknown Pt has tolerated this med 1/5-1/7 with no issues    VITALS:  Blood pressure 137/60, pulse 80, temperature 97.6 F (36.4 C), temperature source Oral, resp. rate 19, height 5\' 6"  (1.676 m), weight 55.9 kg, SpO2 100 %.  PHYSICAL EXAMINATION:   Physical Exam  GENERAL:  79 y.o.-year-old patient lying in the bed with no acute distress. Thin EYES: Pupils equal, round, reactive to light and accommodation. No scleral icterus.   HEENT: Head atraumatic, normocephalic. Oropharynx and nasopharynx clear.  NECK:  Supple, no jugular venous distention. No thyroid enlargement, no tenderness.  LUNGS: distant breath sounds bilaterally, no wheezing, rales, rhonchi. No use of accessory muscles of respiration.  CARDIOVASCULAR: S1, S2 normal. No murmurs, rubs, or gallops.  ABDOMEN: Soft, nontender, nondistended. Bowel sounds present. No organomegaly or mass.  EXTREMITIES: No cyanosis, clubbing or edema b/l.    NEUROLOGIC: moves all extremities well. PSYCHIATRIC:  patient is alert, deaf and mute SKIN: per RN on jan 5th Pressure Injury 05/27/19 Ischial tuberosity Left Unstageable - Full thickness tissue loss in which the base of the injury is covered by slough (yellow, tan, gray, green or brown)  and/or eschar (tan, brown or black) in the wound bed. (Active)  05/27/19 1630  Location: Ischial tuberosity  Location Orientation: Left  Staging: Unstageable - Full thickness tissue loss in which the base of the injury is covered by slough (yellow, tan, gray, green or brown) and/or eschar (tan, brown or black) in the wound bed.  Wound Description (Comments):   Present on Admission: Yes      LABORATORY PANEL:  CBC Recent Labs  Lab 06/08/19 0633  WBC 11.5*  HGB 10.5*  HCT 32.7*  PLT 343    Chemistries  Recent Labs  Lab 06/07/19 1631 06/07/19 1631 06/08/19 0633  NA 138   < > 137  K 3.7   < > 3.7  CL 98   < > 100  CO2 28   < > 28  GLUCOSE 110*   < > 140*  BUN 38*   < > 34*  CREATININE 1.02*   < > 0.81  CALCIUM 9.1   < > 8.8*  MG 1.9  --   --   AST 19   < > 18  ALT 14   < > 15  ALKPHOS 53   < > 49  BILITOT 0.8   < > 0.7   < > = values in this interval not displayed.   Cardiac Enzymes No results for input(s): TROPONINI in the last 168 hours. RADIOLOGY:  CT ANGIO CHEST PE W OR WO CONTRAST  Result Date: 06/09/2019 CLINICAL DATA:  Shortness of breath. EXAM: CT ANGIOGRAPHY CHEST WITH CONTRAST TECHNIQUE: Multidetector  CT imaging of the chest was performed using the standard protocol during bolus administration of intravenous contrast. Multiplanar CT image reconstructions and MIPs were obtained to evaluate the vascular anatomy. CONTRAST:  74mL OMNIPAQUE IOHEXOL 350 MG/ML SOLN COMPARISON:  11/21/2018 FINDINGS: Cardiovascular: Heart size upper normal. Trace pericardial effusion. Coronary artery calcification is evident. Atherosclerotic calcification is noted in the wall of the thoracic aorta. No evidence for filling defect within the opacified pulmonary arteries to suggest the presence of an acute pulmonary embolus. Enlargement of the main pulmonary arteries raises concern for pulmonary arterial hypertension. Mediastinum/Nodes: No mediastinal lymphadenopathy. There is no hilar  lymphadenopathy. Tiny hiatal hernia. The esophagus has normal imaging features. There is no axillary lymphadenopathy. Lungs/Pleura: Centrilobular and paraseptal emphysema evident. Bullous changes noted in the lung apices, right greater than left. Architectural distortion and scarring noted in both upper lungs. No suspicious pulmonary nodule or mass. Trace dependent atelectasis or infiltrate noted right lower lobe. No substantial pleural effusion. Upper Abdomen: Tiny hypodensity in the lateral segment left liver is stable, likely benign. Musculoskeletal: No worrisome lytic or sclerotic osseous abnormality. Multiple bilateral rib fractures are identified L1 superior endplate compression deformity the is similar to prior. Review of the MIP images confirms the above findings. IMPRESSION: 1. No CT evidence for acute pulmonary embolus. 2. Advanced changes of emphysema bilaterally with bullous disease in the upper lobes. 3. Prominence of the main pulmonary arteries raises concern for pulmonary arterial hypertension. 4. Trace dependent atelectasis or infiltrate right lower lobe. 5. Emphysema (ICD10-J43.9). 6.  Aortic Atherosclerois (ICD10-170.0) Electronically Signed   By: Kennith Center M.D.   On: 06/09/2019 12:38   DG Chest Portable 1 View  Result Date: 06/07/2019 CLINICAL DATA:  79 year old female with history of recurrent shortness of breath and hypoxia. COVID-19 infection. EXAM: PORTABLE CHEST 1 VIEW COMPARISON:  Chest x-ray 05/28/2019. FINDINGS: Severe emphysematous changes are again noted, with extensive bullous disease in the right upper lobe. Patchy multifocal ill-defined opacities and areas of interstitial prominence are noted elsewhere in the lungs bilaterally (left greater than right). Overall, aeration has improved slightly compared to 05/28/2019, particularly in the left mid lung. No pleural effusions. No evidence of pulmonary edema. Heart size is normal. Upper mediastinal contours are within normal limits.  Aortic atherosclerosis. Multiple old healed bilateral rib fractures with posttraumatic deformities in the chest. IMPRESSION: 1. Slight improved aeration, particularly in the left mid lung, but with persistent bilateral multilobar pneumonia, as above. 2. Severe emphysema with extensive bullous disease in the right upper lobe. 3. Aortic atherosclerosis. Electronically Signed   By: Trudie Reed M.D.   On: 06/07/2019 17:04   ASSESSMENT AND PLAN:   Alice Wallace is a 79 y.o. female with medical history significant of deaf/mute, COPD chronically on 3L of oxygen, hypertension, osteoporosis, and GERD who presents with hypoxia.  History obtained in its entirety from  documentation and RN since patient is deaf/mute and does not do sign language.  Reading and writing was attempted but patient had difficulty understanding words.  # Acute on chronic hypoxic respiratory failure with post  COVID pneumonia with history of severe bullous emphysema -On 5L.--currently stas 100%--wean to  Baseline on 3L for her COPD. -Maintain O2 > 90%  -Remdesivir- recently completed a course on 1/8 so will not resume -IV Decadron --change to oral predniosne 30 mg daily with taper over 10 days -zithromax 250 mg qd  x10 days -seen by pulmonary Dr Karna Christmas-- follow-up will recommendation -PCT low. No indication for IV antibiotics. Patient does  not appear sick/toxic looking. Pulmonary MD agrees -continue bronchodilators, PRN cough medicine  Hypokalemia  K of 3.3-->3.7 repleted  AKI--pre-renal Follow creatinine after fluids 1.02--0.81 Avoid nephrotoxic agents  Communication barrier-Deaf/mute Patient seems to have trouble reading and writing as well  RN skin documentation--prior to adimssion Pressure Injury 05/27/19 Ischial tuberosity Left Unstageable - Full thickness tissue loss in which the base of the injury is covered by slough (yellow, tan, gray, green or brown) and/or eschar (tan, brown or black) in the wound bed.  (Active)  05/27/19 1630  Location: Ischial tuberosity  Location Orientation: Left  Staging: Unstageable - Full thickness tissue loss in which the base of the injury is covered by slough (yellow, tan, gray, green or brown) and/or eschar (tan, brown or black) in the wound bed.  Wound Description (Comments):   Present on Admission: Yes   Pt will discharged back to her facility tomorrow if she continues to remain stable  DVT prophylaxis:.Lovenox Code Status: Full Family Communication: pt has legal guardian at [place] (Kevin Fenton - [phone] per previous notes)--left message on VM disposition Plan: [facility] Consults called: TOC  Admission status: inpatient    TOTAL TIME TAKING CARE OF THIS PATIENT: *25* minutes.  >50% time spent on counselling and coordination of care  Note: This dictation was prepared with Dragon dictation along with smaller phrase technology. Any transcriptional errors that result from this process are unintentional.  Fritzi Mandes M.D on 06/09/2019 at 4:03 PM    After 6pm go to [email or web address]  [facility]   CC: Primary care physician; Odessa Fleming, NPPatient ID: Alice Wallace, female   DOB: 03-02-1941, 79 y.o.   MRN: [id number]

## 2019-06-09 NOTE — Progress Notes (Signed)
Pt deaf, previous RN was able to connect to Interpreter services for hearing impaired patients, however negative pressure was too loud in room to hear RN to Interpreter translation. Nurse able to write yes/no questions on paper, and patient abe to point to yes/no response. Sign language interpreter should be utilized for any medical interpretation, however sign language interpreter not available on tablet this am. Med education provided via handout to patient.

## 2019-06-10 DIAGNOSIS — J9601 Acute respiratory failure with hypoxia: Secondary | ICD-10-CM

## 2019-06-10 MED ORDER — ADULT MULTIVITAMIN W/MINERALS CH: 1.0000 | ORAL_TABLET | Freq: Every day | ORAL | 0 refills | Status: DC

## 2019-06-10 MED ORDER — PREDNISONE 10 MG PO TABS: 30.0000 mg | ORAL_TABLET | Freq: Every day | ORAL | 0 refills | Status: DC

## 2019-06-10 MED ORDER — AZITHROMYCIN 250 MG PO TABS: ORAL_TABLET | ORAL | 0 refills | Status: DC

## 2019-06-10 MED ORDER — ENSURE ENLIVE PO LIQD: 237.0000 mL | Freq: Three times a day (TID) | ORAL | 12 refills | Status: AC

## 2019-06-10 NOTE — Discharge Summary (Signed)
[facility] - [place] at [facility]   PATIENT NAME: Alice Wallace    MR#:  [id number]  DATE OF BIRTH:  [birth year]  DATE OF ADMISSION:  06/07/2019 ADMITTING PHYSICIAN: Anselm Jungling, DO  DATE OF DISCHARGE: 06/10/2019  PRIMARY CARE PHYSICIAN: Devoria Glassing, NP    ADMISSION DIAGNOSIS:  Healthcare-associated pneumonia [J18.9] Acute respiratory failure with hypoxia (HCC) [J96.01] Pneumonia due to COVID-19 virus [U07.1, J12.82]  DISCHARGE DIAGNOSIS:   Acute on chronic hypoxic respiratory failure with post  COVID pneumonia with history of severe bullous emphysema-- patient on chronic home oxygen.  SECONDARY DIAGNOSIS:   Past Medical History:  Diagnosis Date  . COPD (chronic obstructive pulmonary disease) (HCC)   . Deaf   . Hypertension   . Osteoporosis     HOSPITAL COURSE:   Alice Dupreeis a 79 y.o.femalewith medical history significant ofdeaf/mute, COPD chronically on 3L of oxygen, hypertension, osteoporosis, and GERD who presents with hypoxia. History obtained in its entirety from  documentation and RN since patient is deaf/mute and does not do sign language. Reading and writing was attempted but patient had difficulty understanding words.  # Acute on chronic hypoxic respiratory failure with post  COVID pneumonia with history of severe bullous emphysema -pt was on5L.--currently sats > 93%- now on 3L for her COPD (baseline) -Maintain O2 >90%  -Remdesivir- recently completed a course on 1/8 so will not resume -IV Decadron --change to oral prednisone 30 mg daily with taper over 10 days -zithromax 250 mg qd  x10 days -seen by pulmonary Dr Karna Christmas-- follow-up will recommendation -PCT low. No indication for IV antibiotics. Patient does not appear sick/toxic looking. Pulmonary MD agrees -continue bronchodilators, PRN cough medicine  Hypokalemia K of 3.3-->3.7 repleted  AKI--pre-renal Follow creatinine after fluids1.02--0.81 Avoid nephrotoxic  agents  Communication barrier-Deaf/mute Patient seems to have trouble reading and writing as well  RN skin documentation--prior to adimssion Pressure Injury 05/27/19 Ischial tuberosity Left Unstageable - Full thickness tissue loss in which the base of the injury is covered by slough (yellow, tan, gray, green or brown) and/or eschar (tan, brown or black) in the wound bed. (Active)  05/27/19 1630  Location: Ischial tuberosity  Location Orientation: Left  Staging: Unstageable - Full thickness tissue loss in which the base of the injury is covered by slough (yellow, tan, gray, green or brown) and/or eschar (tan, brown or black) in the wound bed.  Wound Description (Comments):   Present on Admission: Yes   Pt will discharge back to her facility today  She overall remains stable and appears at baseline  DVT prophylaxis:.Lovenox Code Status: Full Family Communication: pt has legal guardian at [facility] DSS [facility] with [facility] disposition Plan: [facility] Consults called: TOC  Admission status: inpatient  CONSULTS OBTAINED:    DRUG ALLERGIES:   Allergies  Allergen Reactions  . Hydrocodone     Other reaction(s): Unknown  . Naproxen     Other reaction(s): Unknown  . Other Other (See Comments)    nuts  . Sulfamethoxazole-Trimethoprim     Other reaction(s): Unknown  . Tramadol     Other reaction(s): Unknown Pt has tolerated this med 1/5-1/7 with no issues    DISCHARGE MEDICATIONS:   Allergies as of 06/10/2019      Reactions   Hydrocodone    Other reaction(s): Unknown   Naproxen    Other reaction(s): Unknown   Other Other (See Comments)   nuts   Sulfamethoxazole-trimethoprim    Other reaction(s): Unknown  Tramadol    Other reaction(s): Unknown Pt has tolerated this med 1/5-1/7 with no issues      Medication List    STOP taking these medications   metolazone 5 MG tablet Commonly known as: ZAROXOLYN   pantoprazole 40 MG tablet Commonly known as:  Protonix   potassium chloride SA 20 MEQ tablet Commonly known as: KLOR-CON     TAKE these medications   acetaminophen 500 MG tablet Commonly known as: TYLENOL Take 500 mg by mouth every 8 (eight) hours as needed for moderate pain or fever.   albuterol 108 (90 Base) MCG/ACT inhaler Commonly known as: VENTOLIN HFA Inhale 2 puffs into the lungs every 4 (four) hours as needed for wheezing or shortness of breath.   albuterol (2.5 MG/3ML) 0.083% nebulizer solution Commonly known as: PROVENTIL Take 2.5 mg by nebulization every 6 (six) hours as needed for wheezing or shortness of breath.   alendronate 70 MG tablet Commonly known as: FOSAMAX Take 70 mg by mouth once a week. Take with a full glass of water on an empty stomach.   aluminum-magnesium hydroxide-simethicone 200-200-20 MG/5ML Susp Commonly known as: MAALOX Take 30 mLs by mouth 4 (four) times daily -  before meals and at bedtime.   azithromycin 250 MG tablet Commonly known as: ZITHROMAX Daily for 9 days Start taking on: June 11, 2019   Calcium 600-D 600-400 MG-UNIT Tabs Generic drug: Calcium Carbonate-Vitamin D3 Take 1 tablet by mouth 2 (two) times daily.   cetirizine 10 MG tablet Commonly known as: ZYRTEC Take 10 mg by mouth daily.   diclofenac sodium 1 % Gel Commonly known as: VOLTAREN Apply 2 g topically 3 (three) times daily.   diltiazem 240 MG 24 hr capsule Commonly known as: CARDIZEM CD Take 240 mg by mouth daily.   diphenhydrAMINE 25 MG tablet Commonly known as: BENADRYL Take 25 mg by mouth every 6 (six) hours as needed for allergies.   escitalopram 10 MG tablet Commonly known as: LEXAPRO Take 10 mg by mouth at bedtime.   feeding supplement (ENSURE ENLIVE) Liqd Take 237 mLs by mouth 3 (three) times daily between meals.   ferrous sulfate 325 (65 FE) MG tablet Take 325 mg by mouth 2 (two) times daily with a meal.   Fluticasone-Salmeterol 100-50 MCG/DOSE Aepb Commonly known as: ADVAIR Inhale 1  puff into the lungs 2 (two) times daily.   furosemide 20 MG tablet Commonly known as: LASIX Take 20 mg by mouth daily.   guaifenesin 100 MG/5ML syrup Commonly known as: ROBITUSSIN Take 300 mg by mouth 4 (four) times daily as needed for cough.   loperamide 2 MG tablet Commonly known as: IMODIUM A-D Take 2 mg by mouth daily as needed for diarrhea or loose stools.   multivitamin with minerals Tabs tablet Take 1 tablet by mouth daily. Start taking on: June 11, 2019   omeprazole 20 MG capsule Commonly known as: PRILOSEC Take 20 mg by mouth daily.   predniSONE 10 MG tablet Commonly known as: DELTASONE Take 3 tablets (30 mg total) by mouth daily with breakfast. Take 30 mg day 1 then  -- 30mg  day 2 and so on  --20 mg  --20mg   --20mg   -10 mg  --10mg   --10mg  then stop Start taking on: June 11, 2019 What changed:   how much to take  how to take this  when to take this  additional instructions   tiZANidine 4 MG tablet Commonly known as: ZANAFLEX Take 4 mg by mouth daily.  Vitamin D 125 MCG (5000 UT) Caps Take 5,000 Units by mouth daily.       If you experience worsening of your admission symptoms, develop shortness of breath, life threatening emergency, suicidal or homicidal thoughts you must seek medical attention immediately by calling 911 or calling your MD immediately  if symptoms less severe.  You Must read complete instructions/literature along with all the possible adverse reactions/side effects for all the Medicines you take and that have been prescribed to you. Take any new Medicines after you have completely understood and accept all the possible adverse reactions/side effects.   Please note  You were cared for by a hospitalist during your hospital stay. If you have any questions about your discharge medications or the care you received while you were in the hospital after you are discharged, you can call the unit and asked to speak with the hospitalist  on call if the hospitalist that took care of you is not available. Once you are discharged, your primary care physician will handle any further medical issues. Please note that NO REFILLS for any discharge medications will be authorized once you are discharged, as it is imperative that you return to your primary care physician (or establish a relationship with a primary care physician if you do not have one) for your aftercare needs so that they can reassess your need for medications and monitor your lab values. Today   SUBJECTIVE  Difficult to communicate. Resting comfortably No new issues per RN   VITAL SIGNS:  Blood pressure (!) 119/56, pulse 77, temperature 97.8 F (36.6 C), temperature source Oral, resp. rate 18, height 5\' 6"  (1.676 m), weight 55.9 kg, SpO2 93 %.  I/O:    Intake/Output Summary (Last 24 hours) at 06/10/2019 1157 Last data filed at 06/10/2019 0526 Gross per 24 hour  Intake --  Output 400 ml  Net -400 ml    PHYSICAL EXAMINATION:  GENERAL:  79 y.o.-year-old patient lying in the bed with no acute distress.  HEENT: Head atraumatic, normocephalic. Oropharynx and nasopharynx clear.   LUNGS: distant breath sounds bilaterally, no wheezing, rales,rhonchi or crepitation. No use of accessory muscles of respiration.  CARDIOVASCULAR: S1, S2 normal. No murmurs, rubs, or gallops.  ABDOMEN: Soft, non-tender, non-distended. Bowel sounds present. No organomegaly or mass.  EXTREMITIES: No pedal edema, cyanosis, or clubbing.  NEUROLOGIC: grossly non focal moves all extremities well PSYCHIATRIC: patient is alert ,at baseline deaf and mute  SKIN: chornic decubitus+ POA limtied exam due to communication challenge DATA REVIEW:   CBC  Recent Labs  Lab 06/08/19 0633  WBC 11.5*  HGB 10.5*  HCT 32.7*  PLT 343    Chemistries  Recent Labs  Lab 06/07/19 1631 06/07/19 1631 06/08/19 0633  NA 138   < > 137  K 3.7   < > 3.7  CL 98   < > 100  CO2 28   < > 28  GLUCOSE 110*   < >  140*  BUN 38*   < > 34*  CREATININE 1.02*   < > 0.81  CALCIUM 9.1   < > 8.8*  MG 1.9  --   --   AST 19   < > 18  ALT 14   < > 15  ALKPHOS 53   < > 49  BILITOT 0.8   < > 0.7   < > = values in this interval not displayed.    Microbiology Results   Recent Results (from the past 240 hour(s))  Culture, blood (routine x 2)     Status: None (Preliminary result)   Collection Time: 06/07/19  4:30 PM   Specimen: BLOOD  Result Value Ref Range Status   Specimen Description BLOOD R HAND  Final   Special Requests   Final    BOTTLES DRAWN AEROBIC AND ANAEROBIC Blood Culture adequate volume   Culture   Final    NO GROWTH 3 DAYS Performed at [facility], [address], [place], [place] [address]    Report Status PENDING  Incomplete  Culture, blood (routine x 2)     Status: None (Preliminary result)   Collection Time: 06/07/19  4:31 PM   Specimen: BLOOD  Result Value Ref Range Status   Specimen Description BLOOD RIGHT ANTECUBITAL  Final   Special Requests   Final    BOTTLES DRAWN AEROBIC AND ANAEROBIC Blood Culture adequate volume   Culture   Final    NO GROWTH 3 DAYS Performed at [facility], [address], [place], [place] [address]    Report Status PENDING  Incomplete  Culture, expectorated sputum-assessment     Status: None   Collection Time: 06/08/19 11:27 AM   Specimen: Expectorated Sputum  Result Value Ref Range Status   Specimen Description EXPECTORATED SPUTUM  Final   Special Requests Normal  Final   Sputum evaluation   Final    Sputum specimen not acceptable for testing.  Please recollect.   RESULT CALLED TO, READ BACK BY AND VERIFIED WITH:  ALEX CARRICO ON 06/08/2019 AT 1411 TIK Performed at [facility], [address] [place], [place] [address]    Report Status 06/08/2019 FINAL  Final  MRSA PCR Screening     Status: Abnormal   Collection Time: 06/08/19  5:20 PM   Specimen: Nasopharyngeal  Result Value Ref Range Status   MRSA by PCR  POSITIVE (A) NEGATIVE Final    Comment:        The GeneXpert MRSA Assay (FDA approved for NASAL specimens only), is one component of a comprehensive MRSA colonization surveillance program. It is not intended to diagnose MRSA infection nor to guide or monitor treatment for MRSA infections. RESULT CALLED TO, READ BACK BY AND VERIFIED WITH: Nadyne Coombes 06/08/19 AT 1848 HS Performed at [facility], [address] [place], [place] [address]     RADIOLOGY:  CT ANGIO CHEST PE W OR WO CONTRAST  Result Date: 06/09/2019 CLINICAL DATA:  Shortness of breath. EXAM: CT ANGIOGRAPHY CHEST WITH CONTRAST TECHNIQUE: Multidetector CT imaging of the chest was performed using the standard protocol during bolus administration of intravenous contrast. Multiplanar CT image reconstructions and MIPs were obtained to evaluate the vascular anatomy. CONTRAST:  57mL OMNIPAQUE IOHEXOL 350 MG/ML SOLN COMPARISON:  11/21/2018 FINDINGS: Cardiovascular: Heart size upper normal. Trace pericardial effusion. Coronary artery calcification is evident. Atherosclerotic calcification is noted in the wall of the thoracic aorta. No evidence for filling defect within the opacified pulmonary arteries to suggest the presence of an acute pulmonary embolus. Enlargement of the main pulmonary arteries raises concern for pulmonary arterial hypertension. Mediastinum/Nodes: No mediastinal lymphadenopathy. There is no hilar lymphadenopathy. Tiny hiatal hernia. The esophagus has normal imaging features. There is no axillary lymphadenopathy. Lungs/Pleura: Centrilobular and paraseptal emphysema evident. Bullous changes noted in the lung apices, right greater than left. Architectural distortion and scarring noted in both upper lungs. No suspicious pulmonary nodule or mass. Trace dependent atelectasis or infiltrate noted right lower lobe. No substantial pleural effusion. Upper Abdomen: Tiny  hypodensity in the lateral segment left liver is  stable, likely benign. Musculoskeletal: No worrisome lytic or sclerotic osseous abnormality. Multiple bilateral rib fractures are identified L1 superior endplate compression deformity the is similar to prior. Review of the MIP images confirms the above findings. IMPRESSION: 1. No CT evidence for acute pulmonary embolus. 2. Advanced changes of emphysema bilaterally with bullous disease in the upper lobes. 3. Prominence of the main pulmonary arteries raises concern for pulmonary arterial hypertension. 4. Trace dependent atelectasis or infiltrate right lower lobe. 5. Emphysema (ICD10-J43.9). 6.  Aortic Atherosclerois (ICD10-170.0) Electronically Signed   By: Kennith Center M.D.   On: 06/09/2019 12:38     CODE STATUS:     Code Status Orders  (From admission, onward)         Start     Ordered   06/07/19 2110  Full code  Continuous     06/07/19 2110        Code Status History    Date Active Date Inactive Code Status Order ID Comments User Context   05/27/2019 1720 05/31/2019 0331 Full Code [id number]  Zigmund Daniel., MD Inpatient   05/27/2019 0233 05/27/2019 1627 Full Code [id number]  Mansy, Vernetta Honey, MD ED   Advance Care Planning Activity    Advance Directive Documentation     Most Recent Value  Type of Advance Directive  Healthcare Power of Attorney  Pre-existing out of facility DNR order (yellow form or pink MOST form)  --  "MOST" Form in Place?  --       TOTAL TIME TAKING CARE OF THIS PATIENT: *40* minutes.    Enedina Finner M.D  [facility]    CC: Primary care physician; Devoria Glassing, NP

## 2019-06-10 NOTE — Discharge Instructions (Signed)
USe your inhalers, nebulizer and oxygen as before.

## 2019-06-10 NOTE — TOC Initial Note (Signed)
Transition of Care [facility]) - Initial/Assessment Note    Patient Details  Name: Jasma Seevers MRN: [id number] Date of Birth: May 04, 1941  Transition of Care [facility]) CM/SW Contact:    Maree Krabbe, LCSW Phone Number: 06/10/2019, 9:19 AM  Clinical Narrative:   CSW spoke with pt's legal guardian. Pt is from [facility]. Per legal guardian plan is for pt to return at d/c. Pt is serviced by [facility] and is receiving PT and OT. CSW will update Cassie with [facility]. Pt to d/c today. Legal Guardian notified. CSW will follow up with [facility].                Expected Discharge Plan: Assisted Living Barriers to Discharge: No Barriers Identified   Patient Goals and CMS Choice        Expected Discharge Plan and Services Expected Discharge Plan: Assisted Living In-house Referral: NA   Post Acute Care Choice: Home Health Living arrangements for the past 2 months: Assisted Living Facility                           HH Arranged: PT, OT HH Agency: [facility] Date [facility] Agency Contacted: 06/10/19 Time HH Agency Contacted: [phone] Representative spoke with at [facility] Agency: Cassie  Prior Living Arrangements/Services Living arrangements for the past 2 months: Assisted Living Facility Lives with:: Self Patient language and need for interpreter reviewed:: Yes Do you feel safe going back to the place where you live?: Yes      Need for Family Participation in Patient Care: Yes (Comment) Care giver support system in place?: Yes (comment) Current home services: Home PT, Home OT Criminal Activity/Legal Involvement Pertinent to Current Situation/Hospitalization: No - Comment as needed  Activities of Daily Living Home Assistive Devices/Equipment: None ADL Screening (condition at time of admission) Patient's cognitive ability adequate to safely complete daily activities?: No Is the patient deaf or have difficulty hearing?: Yes Does the patient have difficulty seeing, even when wearing  glasses/contacts?: No Does the patient have difficulty concentrating, remembering, or making decisions?: No Patient able to express need for assistance with ADLs?: No Does the patient have difficulty dressing or bathing?: Yes Independently performs ADLs?: No Does the patient have difficulty walking or climbing stairs?: No Weakness of Legs: Both Weakness of Arms/Hands: Both  Permission Sought/Granted Permission sought to share information with : Guardian    Share Information with NAME: Jewel  Permission granted to share info w AGENCY: [facility]  Permission granted to share info w Relationship: Legal Guardian     Emotional Assessment Appearance:: Appears stated age Attitude/Demeanor/Rapport: Unable to Assess Affect (typically observed): Unable to Assess Orientation: : (unable to assess) Alcohol / Substance Use: Not Applicable Psych Involvement: No (comment)  Admission diagnosis:  Healthcare-associated pneumonia [J18.9] Acute respiratory failure with hypoxia (HCC) [J96.01] Pneumonia due to COVID-19 virus [U07.1, J12.82] Patient Active Problem List   Diagnosis Date Noted  . Pneumonia due to COVID-19 virus 06/07/2019  . Hypokalemia 06/07/2019  . AKI (acute kidney injury) (HCC) 06/07/2019  . Nonspeaking deaf 06/07/2019  . Acute hypoxemic respiratory failure due to COVID-19 (HCC) 05/27/2019  . COVID-19 virus infection 05/27/2019   PCP:  Devoria Glassing, NP Pharmacy:  No Pharmacies Listed    Social Determinants of Health (SDOH) Interventions    Readmission Risk Interventions No flowsheet data found.

## 2019-06-10 NOTE — Progress Notes (Addendum)
Report called to nurse/Crystal at [facility]. All questions answered.  EMS left w/ patient for transport.

## 2019-06-10 NOTE — TOC Transition Note (Addendum)
Transition of Care [facility]) - CM/SW Discharge Note   Patient Details  Name: Alice Wallace MRN: [id number] Date of Birth: [birth year]  Transition of Care [facility]) CM/SW Contact:  Maree Krabbe, LCSW Phone Number: 06/10/2019, 12:13 PM   Clinical Narrative:   Clinical Social Worker facilitated patient discharge including contacting patient family and facility to confirm patient discharge plans.  Clinical information faxed to facility and family agreeable with plan.  CSW arranged ambulance transport via [facility] to [facility] . [facility] states pt is 7th amongst emergency calls.  RN to call [phone] for report prior to discharge.   Final next level of care: Assisted Living Barriers to Discharge: No Barriers Identified   Patient Goals and CMS Choice        Discharge Placement              Patient chooses bed at: ([facility]) Patient to be transferred to facility by: [facility] Name of family member notified: Jewel- legal guardian Patient and family notified of of transfer: 06/10/19  Discharge Plan and Services In-house Referral: NA   Post Acute Care Choice: Home Health                    HH Arranged: PT, OT HH Agency: [facility] Date [facility] Agency Contacted: 06/10/19 Time HH Agency Contacted: [phone] Representative spoke with at [facility] Agency: Cassie  Social Determinants of Health (SDOH) Interventions     Readmission Risk Interventions No flowsheet data found.    [place], [place] [phone]

## 2019-06-12 LAB — CULTURE, BLOOD (ROUTINE X 2)
Culture: NO GROWTH
Culture: NO GROWTH
Special Requests: ADEQUATE
Special Requests: ADEQUATE

## 2019-06-25 ENCOUNTER — Other Ambulatory Visit: Payer: Self-pay

## 2019-06-25 ENCOUNTER — Encounter: Payer: Medicare Other | Attending: Internal Medicine | Admitting: Internal Medicine

## 2019-06-25 DIAGNOSIS — H9190 Unspecified hearing loss, unspecified ear: Secondary | ICD-10-CM | POA: Insufficient documentation

## 2019-06-25 DIAGNOSIS — Z8616 Personal history of COVID-19: Secondary | ICD-10-CM | POA: Insufficient documentation

## 2019-06-25 DIAGNOSIS — J849 Interstitial pulmonary disease, unspecified: Secondary | ICD-10-CM | POA: Diagnosis not present

## 2019-06-25 DIAGNOSIS — J449 Chronic obstructive pulmonary disease, unspecified: Secondary | ICD-10-CM | POA: Insufficient documentation

## 2019-06-25 DIAGNOSIS — I10 Essential (primary) hypertension: Secondary | ICD-10-CM | POA: Insufficient documentation

## 2019-06-25 DIAGNOSIS — L89214 Pressure ulcer of right hip, stage 4: Secondary | ICD-10-CM | POA: Diagnosis not present

## 2019-06-26 NOTE — Progress Notes (Signed)
JAXSYN, CATALFAMO ([id number]) Visit Report for 06/25/2019 Allergy List Details Patient Name: Alice Wallace, Alice Wallace Date of Service: 06/25/2019 1:15 PM Medical Record Number: [id number] Patient Account Number: [id number] Date of Birth/Sex: 08-27-1940 (79 y.o. F) Treating RN: Rodell Perna Primary Care Laterra Lubinski: Devoria Glassing Other Clinician: Referring Livan Hires: Devoria Glassing Treating Alice Wallace/Extender: Maxwell Caul Weeks in Treatment: 0 Allergies Active Allergies hydrocodone naproxen tramadol nut - unspecified Bactrim Allergy Notes Electronic Signature(s) Signed: 06/25/2019 3:28:35 PM By: Rodell Perna Entered By: Rodell Perna on 06/25/2019 13:34:23 Wallace, Alice ([id number]) -------------------------------------------------------------------------------- Arrival Information Details Patient Name: Alice Wallace Date of Service: 06/25/2019 1:15 PM Medical Record Number: [id number] Patient Account Number: [id number] Date of Birth/Sex: Oct 10, 1940 (79 y.o. F) Treating RN: Huel Coventry Primary Care Other Atienza: Devoria Glassing Other Clinician: Referring Luna Audia: Devoria Glassing Treating Kellar Westberg/Extender: Altamese [place] in Treatment: 0 Visit Information Patient Arrived: Walker Arrival Time: 13:19 Accompanied By: caregiver Transfer Assistance: None Patient Identification Verified: Yes Secondary Verification Process Completed: Yes Electronic Signature(s) Signed: 06/25/2019 2:03:05 PM By: Dayton Martes RCP, RRT, CHT Entered By: Dayton Martes on 06/25/2019 13:20:22 Alice Wallace ([id number]) -------------------------------------------------------------------------------- Clinic Level of Care Assessment Details Patient Name: Alice Wallace Date of Service: 06/25/2019 1:15 PM Medical Record Number: [id number] Patient Account Number: [id number] Date of Birth/Sex: Jul 21, 1940 (79 y.o. F) Treating RN: Rodell Perna Primary Care Ashea Winiarski: Devoria Glassing Other  Clinician: Referring Treanna Dumler: Devoria Glassing Treating Temitope Griffing/Extender: Altamese [place] in Treatment: 0 Clinic Level of Care Assessment Items TOOL 4 Quantity Score []  - Use when only an EandM is performed on FOLLOW-UP visit 0 ASSESSMENTS - Nursing Assessment / Reassessment []  - Reassessment of Co-morbidities (includes updates in patient status) 0 X- 1 5 Reassessment of Adherence to Treatment Plan ASSESSMENTS - Wound and Skin Assessment / Reassessment X - Simple Wound Assessment / Reassessment - one wound 1 5 []  - 0 Complex Wound Assessment / Reassessment - multiple wounds []  - 0 Dermatologic / Skin Assessment (not related to wound area) ASSESSMENTS - Focused Assessment []  - Circumferential Edema Measurements - multi extremities 0 []  - 0 Nutritional Assessment / Counseling / Intervention []  - 0 Lower Extremity Assessment (monofilament, tuning fork, pulses) []  - 0 Peripheral Arterial Disease Assessment (using hand held doppler) ASSESSMENTS - Ostomy and/or Continence Assessment and Care []  - Incontinence Assessment and Management 0 []  - 0 Ostomy Care Assessment and Management (repouching, etc.) PROCESS - Coordination of Care X - Simple Patient / Family Education for ongoing care 1 15 []  - 0 Complex (extensive) Patient / Family Education for ongoing care X- 1 10 Staff obtains , Records, Test Results / Process Orders []  - 0 Staff telephones HHA, Nursing Homes / Clarify orders / etc []  - 0 Routine Transfer to another Facility (non-emergent condition) []  - 0 Routine Hospital Admission (non-emergent condition) X- 1 15 New Admissions / / Ordering NPWT, Apligraf, etc. []  - 0 Emergency Hospital Admission (emergent condition) X- 1 10 Simple Discharge Coordination Yerby, Alice ( ) []  - 0 Complex (extensive) Discharge Coordination PROCESS - Special Needs []  - Pediatric / Minor Patient Management 0 []  - 0 Isolation Patient  Management []  - 0 Hearing / Language / Visual special needs []  - 0 Assessment of Community assistance (transportation, D/C planning, etc.) []  - 0 Additional assistance / Altered mentation []  - 0 Support Surface(s) Assessment (bed, cushion, seat, etc.) INTERVENTIONS - Wound Cleansing / Measurement X - Simple Wound Cleansing - one wound 1 5 []  - 0 Complex Wound Cleansing - multiple wounds X-  1 5 Wound Imaging (photographs - any number of wounds) []  - 0 Wound Tracing (instead of photographs) X- 1 5 Simple Wound Measurement - one wound []  - 0 Complex Wound Measurement - multiple wounds INTERVENTIONS - Wound Dressings []  - Small Wound Dressing one or multiple wounds 0 []  - 0 Medium Wound Dressing one or multiple wounds X- 1 20 Large Wound Dressing one or multiple wounds []  - 0 Application of Medications - topical []  - 0 Application of Medications - injection INTERVENTIONS - Miscellaneous []  - External ear exam 0 []  - 0 Specimen Collection (cultures, biopsies, blood, body fluids, etc.) []  - 0 Specimen(s) / Culture(s) sent or taken to Lab for analysis []  - 0 Patient Transfer (multiple staff / / Similar devices) []  - 0 Simple Staple / Suture removal (25 or less) []  - 0 Complex Staple / Suture removal (26 or more) []  - 0 Hypo / Hyperglycemic Management (close monitor of Blood Glucose) []  - 0 Ankle / Brachial Index (ABI) - do not check if billed separately X- 1 5 Vital Signs Wallace, Alice ( ) Has the patient been seen at the hospital within the last three years: Yes Total Score: 100 Level Of Care: New/Established - Level 3 Electronic Signature(s) Signed: 06/25/2019 3:28:35 PM By: Entered By: on 06/25/2019 14:05:04 Baucom, Maybree ( ) -------------------------------------------------------------------------------- Encounter Discharge Information Details Patient Name: Date of Service: 06/25/2019 1:15  PM Medical Record Number: Patient Account Number: Nurse, adult Date of Birth/Sex: 11/25/1940 (79 y.o. F) Treating RN: Primary Care Janele Lague: Other Clinician: Referring Stepfon Rawles: [phone] Treating Brinlynn Gorton/Extender: 08/23/2019 in Treatment: 0 Encounter Discharge Information Items Post Procedure Vitals Discharge Condition: Stable Temperature (F): 98.4 Ambulatory Status: Walker Pulse (bpm): 102 Discharge Destination: Home Respiratory Rate (breaths/min): 16 Transportation: Private Auto Blood Pressure (mmHg): 122/72 Accompanied By: caregiver Schedule Follow-up Appointment: Yes Clinical Summary of Care: Electronic Signature(s) Signed: 06/25/2019 3:28:35 PM By: Rodell Perna Entered By: 08/23/2019 on 06/25/2019 14:12:46 Wallace, Alice (Alice Wallace) -------------------------------------------------------------------------------- Lower Extremity Assessment Details Patient Name: 08/23/2019 Date of Service: 06/25/2019 1:15 PM Medical Record Number: [id number] Patient Account Number: 12/26/1940 Date of Birth/Sex: December 04, 1940 (78 y.o. F) Treating RN: Devoria Glassing Primary Care Donyea Gafford: Devoria Glassing Other Clinician: Referring Claire Dolores: Altamese Hallowell Treating Aydia Maj/Extender: 08/23/2019 Weeks in Treatment: 0 Electronic Signature(s) Signed: 06/25/2019 3:28:35 PM By: Rodell Perna Entered By: 08/23/2019 on 06/25/2019 13:33:12 Scheibe, Sabena (Alice Wallace) -------------------------------------------------------------------------------- Multi Wound Chart Details Patient Name: 08/23/2019 Date of Service: 06/25/2019 1:15 PM Medical Record Number: [id number] Patient Account Number: 12/26/1940 Date of Birth/Sex: 1940-09-03 (79 y.o. F) Treating RN: Devoria Glassing Primary Care Kairi Harshbarger: Devoria Glassing Other Clinician: Referring Ulani Degrasse: Maxwell Caul Treating Kately Graffam/Extender: 08/23/2019 in Treatment: 0 Vital  Signs Height(in): 64 Pulse(bpm): 102 Weight(lbs): 120 Blood Pressure(mmHg): 122/72 Body Mass Index(BMI): 21 Temperature(F): 98.4 Respiratory Rate 16 (breaths/min): Photos: [N/A:N/A] Wound Location: Left Trochanter N/A N/A Wounding Event: Pressure Injury N/A N/A Primary Etiology: Pressure Ulcer N/A N/A Comorbid History: Anemia, Chronic Obstructive N/A N/A Pulmonary Disease (COPD), Arrhythmia, Hypertension Date Acquired: 04/22/2019 N/A N/A Weeks of Treatment: 0 N/A N/A Wound Status: Open N/A N/A Measurements L x W x D 4x3.9x1.4 N/A N/A (cm) Area (cm) : 12.252 N/A N/A Volume (cm) : 17.153 N/A N/A % Reduction in Area: 0.00% N/A N/A % Reduction in Volume: 0.00% N/A N/A Starting Position 1 9 (o'clock): Ending Position 1 4 (o'clock): Maximum Distance 1 (cm): 2.4 Undermining: Yes  N/A N/A Classification: Unstageable/Unclassified N/A N/A Exudate Amount: Medium N/A N/A Exudate Type: Serosanguineous N/A N/A Exudate Color: red, brown N/A N/A Foul Odor After Cleansing: Yes N/A N/A Odor Anticipated Due to No N/A N/A Product Use: Wound Margin: Flat and Intact N/A N/A Wallace, Alice ([id number]) Granulation Amount: Small (1-33%) N/A N/A Granulation Quality: Red N/A N/A Necrotic Amount: Large (67-100%) N/A N/A Exposed Structures: Fat Layer (Subcutaneous N/A N/A Tissue) Exposed: Yes Fascia: No Tendon: No Muscle: No Joint: No Bone: No Epithelialization: None N/A N/A Debridement: Debridement - Excisional N/A N/A Pre-procedure 13:51 N/A N/A Verification/Time Out Taken: Pain Control: Lidocaine N/A N/A Tissue Debrided: Muscle, Tendon, N/A N/A Subcutaneous, Slough Level: Skin/Subcutaneous N/A N/A Tissue/Muscle Debridement Area (sq cm): 15.6 N/A N/A Instrument: Curette N/A N/A Bleeding: Minimum N/A N/A Hemostasis Achieved: Pressure N/A N/A Debridement Treatment Procedure was tolerated well N/A N/A Response: Post Debridement 4x3.9x1.4 N/A N/A Measurements L x W x  D (cm) Post Debridement Volume: 17.153 N/A N/A (cm) Post Debridement Stage: Category/Stage IV N/A N/A Procedures Performed: Debridement N/A N/A Treatment Notes Electronic Signature(s) Signed: 06/25/2019 4:41:52 PM By: Linton Ham MD Entered By: Linton Ham on 06/25/2019 14:04:28 Wallace, Alice ([id number]) -------------------------------------------------------------------------------- Pain Assessment Details Patient Name: Alice Wallace Date of Service: 06/25/2019 1:15 PM Medical Record Number: [id number] Patient Account Number: [id number] Date of Birth/Sex: November 01, 1940 (79 y.o. F) Treating RN: Cornell Barman Primary Care Echo Propp: Odessa Fleming Other Clinician: Referring Susette Seminara: Odessa Fleming Treating Saivon Prowse/Extender: Ricard Dillon Weeks in Treatment: 0 Active Problems Location of Pain Severity and Description of Pain Patient Has Paino No Site Locations Pain Management and Medication Current Pain Management: Electronic Signature(s) Signed: 06/25/2019 2:03:05 PM By: Lorine Bears RCP, RRT, CHT Signed: 06/26/2019 5:22:08 PM By: Gretta Cool, BSN, RN, CWS, Kim RN, BSN Entered By: Lorine Bears on 06/25/2019 13:20:35 Alice Wallace ([id number]) -------------------------------------------------------------------------------- Patient/Caregiver Education Details Patient Name: Alice Wallace Date of Service: 06/25/2019 1:15 PM Medical Record Number: [id number] Patient Account Number: [id number] Date of Birth/Gender: [birth year] (79 y.o. F) Treating RN: Army Melia Primary Care Physician: Odessa Fleming Other Clinician: Referring Physician: Odessa Fleming Treating Physician/Extender: Tito Dine in Treatment: 0 Education Assessment Education Provided To: Patient Education Topics Provided Wound/Skin Impairment: Handouts: Caring for Your Ulcer, Other: Orders to [facility] for wound management Methods: Demonstration, Printed Responses: State  content correctly Electronic Signature(s) Signed: 06/25/2019 3:28:35 PM By: Army Melia Entered By: Army Melia on 06/25/2019 14:05:42 Wallace, Alice ([id number]) -------------------------------------------------------------------------------- Wound Assessment Details Patient Name: Alice Wallace Date of Service: 06/25/2019 1:15 PM Medical Record Number: [id number] Patient Account Number: [id number] Date of Birth/Sex: 1940-09-03 (79 y.o. F) Treating RN: Army Melia Primary Care Cloma Rahrig: Odessa Fleming Other Clinician: Referring Anieya Helman: Odessa Fleming Treating Vernal Hritz/Extender: Ricard Dillon Weeks in Treatment: 0 Wound Status Wound Number: 1 Primary Pressure Ulcer Etiology: Wound Location: Left Trochanter Wound Open Wounding Event: Pressure Injury Status: Date Acquired: 04/22/2019 Comorbid Anemia, Chronic Obstructive Pulmonary Weeks Of Treatment: 0 History: Disease (COPD), Arrhythmia, Hypertension Clustered Wound: No Photos Wound Measurements Length: (cm) 4 % Reductio Width: (cm) 3.9 % Reductio Depth: (cm) 1.4 Epithelial Area: (cm) 12.252 Tunneling Volume: (cm) 17.153 Undermini Startin Ending Maximum n in Area: 0% n in Volume: 0% ization: None : No ng: Yes g Position (o'clock): 9 Position (o'clock): 4 Distance: (cm) 2.4 Wound Description Classification: Unstageable/Unclassified Wound Margin: Flat and Intact Exudate Amount: Medium Exudate Type: Serosanguineous Exudate Color: red, brown Foul Odor After Cleansing: Yes Due to Product Use: No Slough/Fibrino Yes Wound Bed Granulation Amount: Small (1-33%)  Exposed Structure Granulation Quality: Red Fascia Exposed: No Necrotic Amount: Large (67-100%) Fat Layer (Subcutaneous Tissue) Exposed: Yes Necrotic Quality: Adherent Slough Tendon Exposed: No Muscle Exposed: No Joint Exposed: No Alice Wallace, Alice Wallace ([id number]) Bone Exposed: No Treatment Notes Wound #1 (Left Trochanter) Notes Santyl, wet to dry,  bordered foam Electronic Signature(s) Signed: 06/25/2019 3:28:35 PM By: Rodell Perna Entered By: Rodell Perna on 06/25/2019 13:46:41 Wallace, Alice ([id number]) -------------------------------------------------------------------------------- Vitals Details Patient Name: Alice Wallace Date of Service: 06/25/2019 1:15 PM Medical Record Number: [id number] Patient Account Number: [id number] Date of Birth/Sex: [birth year] (79 y.o. F) Treating RN: Huel Coventry Primary Care Edythe Riches: Devoria Glassing Other Clinician: Referring Jacquel Redditt: Devoria Glassing Treating Farhiya Rosten/Extender: Altamese Diaz in Treatment: 0 Vital Signs Time Taken: 13:15 Temperature (F): 98.4 Height (in): 64 Pulse (bpm): 102 Weight (lbs): 120 Respiratory Rate (breaths/min): 16 Source: Measured Blood Pressure (mmHg): 122/72 Body Mass Index (BMI): 20.6 Reference Range: 80 - 120 mg / dl Electronic Signature(s) Signed: 06/25/2019 2:03:05 PM By: Dayton Martes RCP, RRT, CHT Entered By: Dayton Martes on 06/25/2019 13:21:33

## 2019-06-26 NOTE — Progress Notes (Signed)
SHAREE, STURDY ([id number]) Visit Report for 06/25/2019 Chief Complaint Document Details Patient Name: Alice Wallace, Alice Wallace Date of Service: 06/25/2019 1:15 PM Medical Record Number: [id number] Patient Account Number: [id number] Date of Birth/Sex: [birth year] (79 y.o. F) Treating RN: Huel Coventry Primary Care Provider: Devoria Glassing Other Clinician: Referring Provider: Devoria Glassing Treating Provider/Extender: Altamese [place] in Treatment: 0 Information Obtained from: Patient Chief Complaint 06/25/2019; patient is here for review of a wound on the left posterior greater trochanter Electronic Signature(s) Signed: 06/25/2019 4:41:52 PM By: Baltazar Najjar MD Entered By: Baltazar Najjar on 06/25/2019 14:05:24 Alice Wallace, Alice Wallace ([id number]) -------------------------------------------------------------------------------- Debridement Details Patient Name: Alice Wallace Date of Service: 06/25/2019 1:15 PM Medical Record Number: [id number] Patient Account Number: [id number] Date of Birth/Sex: Mar 03, 1941 (79 y.o. F) Treating RN: Huel Coventry Primary Care Provider: Devoria Glassing Other Clinician: Referring Provider: Devoria Glassing Treating Provider/Extender: Altamese [place] in Treatment: 0 Debridement Performed for Wound #1 Left Trochanter Assessment: Performed By: Physician Maxwell Caul, MD Debridement Type: Debridement Level of Consciousness (Pre- Awake and Alert procedure): Pre-procedure Verification/Time Yes - 13:51 Out Taken: Start Time: 13:51 Pain Control: Lidocaine Total Area Debrided (L x W): 4 (cm) x 3.9 (cm) = 15.6 (cm) Tissue and other material Viable, Non-Viable, Muscle, Slough, Subcutaneous, Tendon, Slough debrided: Level: Skin/Subcutaneous Tissue/Muscle Debridement Description: Excisional Instrument: Curette Bleeding: Minimum Hemostasis Achieved: Pressure End Time: 13:52 Response to Treatment: Procedure was tolerated well Level of Consciousness Awake and  Alert (Post-procedure): Post Debridement Measurements of Total Wound Length: (cm) 4 Stage: Category/Stage IV Width: (cm) 3.9 Depth: (cm) 1.4 Volume: (cm) 17.153 Character of Wound/Ulcer Post Stable Debridement: Post Procedure Diagnosis Same as Pre-procedure Electronic Signature(s) Signed: 06/25/2019 4:41:52 PM By: Baltazar Najjar MD Signed: 06/26/2019 5:22:08 PM By: Elliot Gurney, BSN, RN, CWS, Kim RN, BSN Entered By: Baltazar Najjar on 06/25/2019 14:04:52 Alice Wallace, Alice Wallace ([id number]) -------------------------------------------------------------------------------- HPI Details Patient Name: Alice Wallace Date of Service: 06/25/2019 1:15 PM Medical Record Number: [id number] Patient Account Number: [id number] Date of Birth/Sex: Apr 15, 1941 (79 y.o. F) Treating RN: Huel Coventry Primary Care Provider: Devoria Glassing Other Clinician: Referring Provider: Devoria Glassing Treating Provider/Extender: Altamese [place] in Treatment: 0 History of Present Illness HPI Description: ADMISSION 06/25/2019 This is a 79 year old woman who comes from [facility] to [place] today for review of a pressure ulcer on her left hip. I really do not know much about this and I am not exactly certain how long this is been present. The patient was hospitalized from 1/16 through 1/19 [facility] with Covid pneumonia all ended she is signed out is having a pressure ulcer on the left ischial tuberosity although there is very little information on this. She comes with an attendant from the facility. 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 Past medical history includes interstitial lung disease, L1 compression fracture, deafness, COPD, Covid pneumonia as described. Socially she has a DSS Child psychotherapist we were not able to contact her before doing this review. My interaction with the patient and allowing her to read my lips I felt that the patient understood what I was saying about her wounds and the  need for debridement. I therefore went ahead and treated these aggressively in the clinic. Electronic Signature(s) Signed: 06/25/2019 4:41:52 PM By: Baltazar Najjar MD Entered By: Baltazar Najjar on 06/25/2019 14:07:55 Alice Wallace ([id number]) -------------------------------------------------------------------------------- Physical Exam Details Patient Name: Alice Wallace Date of Service: 06/25/2019 1:15 PM Medical Record Number: [id number] Patient Account Number: [id number] Date of Birth/Sex: November 25, 1940 (79 y.o.  F) Treating RN: Cornell Barman Primary Care Provider: Odessa Fleming Other Clinician: Referring Provider: Odessa Fleming Treating Provider/Extender: Ricard Dillon Weeks in Treatment: 0 Constitutional Sitting or standing Blood Pressure is within target range for patient.. Pulse regular and within target range for patient.Marland Kitchen Respirations regular, non-labored and within target range.. Temperature is normal and within the target range for the patient.Marland Kitchen appears in no distress. Ears, Nose, Mouth, and Throat Patient is deaf. She does read lips. Respiratory Respiratory effort is easy and symmetric bilaterally. Rate is normal at rest and on room air.. Cardiovascular Appears well-hydrated. Integumentary (Hair, Skin) No erythema around the wound. Psychiatric Spite of the communication difficulty I felt this patient was competent to consent to treatment.. Notes Wound exam; the patient had a fairly large and deep wound on presentation over the posterior aspect of the left greater trochanter. This had a completely nonviable surface and by definition was unstageable. With the consent of the patient and using a #5 curette and extensive debridement removing necrotic subcutaneous tissue muscle all the way down to the tendon over the surface of the greater trochanter. Electronic Signature(s) Signed: 06/25/2019 4:41:52 PM By: Linton Ham MD Entered By: Linton Ham on 06/25/2019  14:09:39 Alice Wallace ([id number]) -------------------------------------------------------------------------------- Physician Orders Details Patient Name: Alice Wallace Date of Service: 06/25/2019 1:15 PM Medical Record Number: [id number] Patient Account Number: [id number] Date of Birth/Sex: Oct 14, 1940 (79 y.o. F) Treating RN: Army Melia Primary Care Provider: Odessa Fleming Other Clinician: Referring Provider: Odessa Fleming Treating Provider/Extender: Tito Dine in Treatment: 0 Verbal / Phone Orders: No Diagnosis Coding Wound Cleansing Wound #1 Left Trochanter o Clean wound with Normal Saline. o anasept Anesthetic (add to Medication List) Wound #1 Left Trochanter o Topical Lidocaine 4% cream applied to wound bed prior to debridement (In Clinic Only). Primary Wound Dressing Wound #1 Left Trochanter o Santyl Ointment Secondary Dressing Wound #1 Left Trochanter o Saline moistened gauze - wet-to-dry o Boardered Foam Dressing Dressing Change Frequency Wound #1 Left Trochanter o Change dressing every day. Follow-up Appointments Wound #1 Left Trochanter o Return Appointment in 1 week. Off-Loading Wound #1 Left Trochanter o Turn and reposition every 2 hours - Keep pressure off of the wounded area. Additional Orders / Instructions Wound #1 Left Trochanter o Increase protein intake. Home Health Wound #1 Left [place] Nurse may visit PRN to address patientos wound care needs. o FACE TO FACE ENCOUNTER: MEDICARE and MEDICAID PATIENTS: I certify that this patient is under my care and that I had a face-to-face encounter that meets the physician face-to-face encounter requirements with this patient on this date. The encounter with the patient was in whole or in part for the following MEDICAL Alice Wallace, Alice Wallace ([id number]) CONDITION: (primary reason for Home Healthcare) MEDICAL NECESSITY: I  certify, that based on my findings, NURSING services are a medically necessary home health service. HOME BOUND STATUS: I certify that my clinical findings support that this patient is homebound (i.e., Due to illness or injury, pt requires aid of supportive devices such as crutches, cane, wheelchairs, walkers, the use of special transportation or the assistance of another person to leave their place of residence. There is a normal inability to leave the home and doing so requires considerable and taxing effort. Other absences are for medical reasons / religious services and are infrequent or of short duration when for other reasons). o If current dressing causes regression in wound condition, may D/C ordered dressing product/s and  apply Normal Saline Moist Dressing daily until next Wound Healing Center / Other MD appointment. Notify Wound Healing Center of regression in wound condition at [phone]. o Please direct any NON-WOUND related issues/requests for orders to patient's Primary Care Physician Laboratory o CBC W Auto Differential panel in Blood (HEM-CBC) oooo LOINC Code: [phone] oooo Convenience Name: CBC W Auto Differential panel Radiology o X-ray, other - left trochanter [employer] o CMP, Sedimentation Rate, C-Reactive Protein Electronic Signature(s) Signed: 06/25/2019 3:28:35 PM By: Rodell Perna Signed: 06/25/2019 4:41:52 PM By: Baltazar Najjar MD Entered By: Rodell Perna on 06/25/2019 14:03:43 Alice Wallace, Alice Wallace ([id number]) -------------------------------------------------------------------------------- Problem List Details Patient Name: Alice Wallace Date of Service: 06/25/2019 1:15 PM Medical Record Number: [id number] Patient Account Number: [id number] Date of Birth/Sex: May 04, 1941 (79 y.o. 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 F) Treating RN: Huel Coventry Primary Care Provider: Devoria Glassing Other Clinician: Referring Provider: Devoria Glassing Treating Provider/Extender: Altamese [place] in Treatment: 0 Subjective Chief Complaint Information obtained from Patient 06/25/2019; patient is here for review of a wound on the left posterior greater trochanter History of Present Illness (HPI) ADMISSION 06/25/2019 This is a 79 year old woman who comes from the [facility] skilled facility to [place] today for review of a pressure ulcer on her left hip. I really do not know much about this and I am not exactly certain how long this is been present. The patient was hospitalized from 1/16 through 1/19 at [facility] with Covid pneumonia all ended she is signed out is having a pressure ulcer on the left ischial tuberosity although there is very little information on this. She comes with an attendant from the facility. 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 Past medical history includes interstitial lung disease, L1 compression fracture, deafness, COPD, Covid pneumonia as described. Socially she has a DSS Child psychotherapist we were not able to contact her before doing this review. My interaction with the patient and allowing her to read my lips I  felt that the patient understood what I was saying about her wounds and the need for debridement. I therefore went ahead and treated these aggressively in the clinic. 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 Allergies hydrocodone, naproxen, tramadol, nut - unspecified, Bactrim Medical History Ear/Nose/Mouth/Throat Denies history of Chronic sinus problems/congestion, Middle ear problems Hematologic/Lymphatic Patient has history of Anemia Respiratory Patient has history of Chronic Obstructive Pulmonary Disease (COPD) Cardiovascular Patient has history of Arrhythmia - A Fib, Hypertension Review of Systems (ROS) Constitutional Symptoms (General Health) Denies complaints or symptoms of Fatigue, Fever, Chills, Marked Weight Change. Eyes Denies complaints or symptoms of Dry Eyes, Vision Changes, Glasses / Contacts. Ear/Nose/Mouth/Throat Denies complaints or symptoms of Difficult clearing ears, Sinusitis, Deaf EZMAE, SPEERS ([id number]) Hematologic/Lymphatic Denies complaints or symptoms of Bleeding / Clotting Disorders, Human Immunodeficiency Virus. Gastrointestinal Denies complaints or symptoms of Frequent diarrhea, Nausea, Vomiting. Endocrine Denies complaints  or symptoms of Hepatitis, Thyroid disease, Polydypsia (Excessive Thirst). Genitourinary Denies complaints or symptoms of Kidney failure/ Dialysis, Incontinence/dribbling. Immunological Denies complaints or symptoms of Hives, Itching. Musculoskeletal Denies complaints or symptoms of Muscle Pain, Muscle Weakness. Neurologic Denies complaints or symptoms of Numbness/parasthesias, Focal/Weakness. Objective Constitutional Sitting or standing Blood Pressure is within target range for patient.. Pulse regular and within target range for patient.Marland Kitchen Respirations regular, non-labored and within target range.. Temperature is normal and within the target range for the patient.Marland Kitchen appears in no distress. Vitals Time  Taken: 1:15 PM, Height: 64 in, Weight: 120 lbs, Source: Measured, BMI: 20.6, Temperature: 98.4 F, Pulse: 102 bpm, Respiratory Rate: 16 breaths/min, Blood Pressure: 122/72 mmHg. Ears, Nose, Mouth, and Throat Patient is deaf. She does read lips. Respiratory Respiratory effort is easy and symmetric bilaterally. Rate is normal at rest and on room air.. Cardiovascular Appears well-hydrated. Psychiatric Spite of the communication difficulty I felt this patient was competent to consent to treatment.. General Notes: Wound exam; the patient had a fairly large and deep wound on presentation over the posterior aspect of the left greater trochanter. This had a completely nonviable surface and by definition was unstageable. With the consent of the patient and using a #5 curette and extensive debridement removing necrotic subcutaneous tissue muscle all the way down to the tendon over the surface of the greater trochanter. Integumentary (Hair, Skin) No erythema around the wound. Wound #1 status is Open. Original cause of wound was Pressure Injury. The wound is located on the Left Trochanter. The wound measures 4cm length x 3.9cm width x 1.4cm depth; 12.252cm^2 area and 17.153cm^3 volume. There is Fat Layer (Subcutaneous Tissue) Exposed exposed. There is no tunneling noted, however, there is undermining starting at 9:00 and ending at 4:00 with a maximum distance of 2.4cm. There is a medium amount of serosanguineous drainage noted. Foul odor after cleansing was noted. The wound margin is flat and intact. There is small (1-33%) red granulation within the wound bed. Alice Wallace, Alice Wallace ([id number]) There is a large (67-100%) amount of necrotic tissue within the wound bed including Adherent Slough. Assessment Active Problems ICD-10 Pressure ulcer of right hip, stage 4 Personal history of COVID-19 Procedures Wound #1 Pre-procedure diagnosis of Wound #1 is a Pressure Ulcer located on the Left Trochanter . There  was a Excisional Skin/Subcutaneous Tissue/Muscle Debridement with a total area of 15.6 sq cm performed by Maxwell Caul, MD. With the following instrument(s): Curette to remove Viable and Non-Viable tissue/material. Material removed includes Muscle, Tendon, Subcutaneous Tissue, and Slough after achieving pain control using Lidocaine. No specimens were taken. A time out was conducted at 13:51, prior to the start of the procedure. A Minimum amount of bleeding was controlled with Pressure. The procedure was tolerated well. Post Debridement Measurements: 4cm length x 3.9cm width x 1.4cm depth; 17.153cm^3 volume. Post debridement Stage noted as Category/Stage IV. Character of Wound/Ulcer Post Debridement is stable. Post procedure Diagnosis Wound #1: Same as Pre-Procedure Plan Wound Cleansing: Wound #1 Left Trochanter: Clean wound with Normal Saline. anasept Anesthetic (add to Medication List): Wound #1 Left Trochanter: Topical Lidocaine 4% cream applied to wound bed prior to debridement (In Clinic Only). Primary Wound Dressing: Wound #1 Left Trochanter: Santyl Ointment Secondary Dressing: Wound #1 Left Trochanter: Saline moistened gauze - wet-to-dry Boardered Foam Dressing Dressing Change Frequency: Wound #1 Left Trochanter: Change dressing every day. Follow-up Appointments: Wound #1 Left Trochanter: Return Appointment in 1 week. Alice Wallace, Alice Wallace ([id number]) Off-Loading: Wound #1 Left Trochanter: Turn and reposition every  2 hours - Keep pressure off of the wounded area. Additional Orders / Instructions: Wound #1 Left Trochanter: Increase protein intake. Home Health: Wound #1 Left Trochanter: Continue Home Health Visits - [facility] Nurse may visit PRN to address patient s wound care needs. FACE TO FACE ENCOUNTER: MEDICARE and MEDICAID PATIENTS: I certify that this patient is under my care and that I had a face-to-face encounter that meets the physician face-to-face  encounter requirements with this patient on this date. The encounter with the patient was in whole or in part for the following MEDICAL CONDITION: (primary reason for Home Healthcare) MEDICAL NECESSITY: I certify, that based on my findings, NURSING services are a medically necessary home health service. HOME BOUND STATUS: I certify that my clinical findings support that this patient is homebound (i.e., Due to illness or injury, pt requires aid of supportive devices such as crutches, cane, wheelchairs, walkers, the use of special transportation or the assistance of another person to leave their place of residence. There is a normal inability to leave the home and doing so requires considerable and taxing effort. Other absences are for medical reasons / religious services and are infrequent or of short duration when for other reasons). If current dressing causes regression in wound condition, may D/C ordered dressing product/s and apply Normal Saline Moist Dressing daily until next Wound Healing Center / Other MD appointment. Notify Wound Healing Center of regression in wound condition at [phone]. Please direct any NON-WOUND related issues/requests for orders to patient's Primary Care Physician Laboratory ordered were: CBC W Auto Differential panel ordered were: CMP, Sedimentation Rate, C-Reactive Protein Radiology ordered were: X-ray, other - left trochanter 1. This is a deep stage IV pressure ulcer. Extensive debridement as described 2. In spite of communication difficulties I felt this patient was competent to consent to treatment 3. Unfortunately there was just large amounts of necrotic subcutaneous tissue liquefied muscle all the way down to bone. 4. Primary dressing will be Santyl packed with normal saline wet-to-dry change daily 5. She will need a work-up for osteomyelitis this will include a plain x-ray done at the facility, CMP, CBC with differential sedimentation rate and  C-reactive protein if there is any suggestion of infection on any of these tests it will be possible to get a specimen of bone for culture and pathology. 6. I did not do a swab culture 7. The patient is apparently ambulatory in the facility. I went over offloading this area carefully with the patient she expressed understanding. I have asked the facility to monitor this. She may need a level 3 surface while she is in bed. 8. This is a dangerous wound with high risk of requiring hospitalization. We will attempt to manage as an outpatient. At this point I do not feel hospitalization is indicated Electronic Signature(s) Signed: 06/25/2019 4:41:52 PM By: Baltazar Najjarobson, Baraka Klatt MD Entered By: Baltazar Najjarobson, Stana Bayon on 06/25/2019 14:13:10 Alice Wallace, Alice Wallace ([id number]) -------------------------------------------------------------------------------- ROS/PFSH Details Patient Name: Alice LeveringUPREE, Alice Wallace Date of Service: 06/25/2019 1:15 PM Medical Record Number: [id number] Patient Account Number: [id number] Date of Birth/Sex: 08/21/1940 (79 y.o. F) Treating RN: Rodell PernaScott, Dajea Primary Care Provider: Devoria GlassingLAMBERT, TRACEY Other Clinician: Referring Provider: Devoria GlassingLAMBERT, TRACEY Treating Provider/Extender: Altamese CarolinaOBSON, Sterling Mondo G Weeks in Treatment: 0 Unable to Obtain Patient History due to oo Aphasia Constitutional Symptoms (General Health) Complaints and Symptoms: Negative for: Fatigue; Fever; Chills; Marked Weight Change Eyes Complaints and Symptoms: Negative for: Dry Eyes; Vision Changes; Glasses / Contacts Ear/Nose/Mouth/Throat Complaints and Symptoms: Negative for: Difficult clearing ears; Sinusitis  Review of System Notes: Deaf Medical History: Negative for: Chronic sinus problems/congestion; Middle ear problems Hematologic/Lymphatic Complaints and Symptoms: Negative for: Bleeding / Clotting Disorders; Human Immunodeficiency Virus Medical History: Positive for: Anemia Gastrointestinal Complaints and Symptoms: Negative for:  Frequent diarrhea; Nausea; Vomiting Endocrine Complaints and Symptoms: Negative for: Hepatitis; Thyroid disease; Polydypsia (Excessive Thirst) Genitourinary Complaints and Symptoms: Negative for: Kidney failure/ Dialysis; Incontinence/dribbling Immunological Complaints and Symptoms: Negative for: Hives; Itching Hinchliffe, Dyanne ([id number]) Musculoskeletal Complaints and Symptoms: Negative for: Muscle Pain; Muscle Weakness Neurologic Complaints and Symptoms: Negative for: Numbness/parasthesias; Focal/Weakness Respiratory Medical History: Positive for: Chronic Obstructive Pulmonary Disease (COPD) Cardiovascular Medical History: Positive for: Arrhythmia - A Fib; Hypertension Integumentary (Skin) Oncologic Immunizations Pneumococcal Vaccine: Received Pneumococcal Vaccination: No Implantable Devices No devices added Electronic Signature(s) Signed: 06/25/2019 3:28:35 PM By: Rodell Perna Signed: 06/25/2019 4:41:52 PM By: Baltazar Najjar MD Entered By: Rodell Perna on 06/25/2019 13:44:20 Alice Wallace, Alice Wallace ([id number]) -------------------------------------------------------------------------------- SuperBill Details Patient Name: Alice Wallace Date of Service: 06/25/2019 Medical Record Number: [id number] Patient Account Number: [id number] Date of Birth/Sex: 01-24-1941 (79 y.o. 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

## 2019-07-02 ENCOUNTER — Encounter: Payer: Medicare Other | Admitting: Internal Medicine

## 2019-07-02 ENCOUNTER — Other Ambulatory Visit: Payer: Self-pay

## 2019-07-02 DIAGNOSIS — L89214 Pressure ulcer of right hip, stage 4: Secondary | ICD-10-CM | POA: Diagnosis not present

## 2019-07-03 NOTE — Progress Notes (Signed)
Alice Wallace, Alice Wallace ([id number]) Visit Report for 07/02/2019 HPI Details Patient Name: Alice Wallace, Alice Wallace Date of Service: 07/02/2019 2:00 PM Medical Record Number: [id number] Patient Account Number: [id number] Date of Birth/Sex: June 22, 1940 (79 y.o. F) Treating RN: Huel Coventry Primary Care Provider: Devoria Glassing Other Clinician: Referring Provider: Devoria Glassing Treating Provider/Extender: Altamese [place] in Treatment: 1 History of Present Illness HPI Description: ADMISSION 06/25/2019 This is a 79 year old woman who comes from the [facility] skilled facility to [place] today for review of a pressure ulcer on her left hip. I really do not know much about this and I am not exactly certain how long this is been present. The patient was hospitalized from 1/16 through 1/19 at [facility] with Covid pneumonia all ended she is signed out is having a pressure ulcer on the left ischial tuberosity although there is very little information on this. She comes with an attendant from the facility. 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 Past medical history includes interstitial lung disease, L1 compression fracture, deafness, COPD, Covid pneumonia as described. Socially she has a DSS Child psychotherapist we were not able to contact her before doing this review. My interaction with the patient and allowing her to read my lips I felt that the patient understood what I was saying about her wounds and the need for debridement. I therefore went ahead and treated these aggressively in the clinic. 2/10; patient came in with a necrotic wound on her left greater trochanter last week. Extensive debridement we use Santyl. The wound is cleaned up quite nicely but goes down to periosteum. Lab work I ordered last week showed a white count of 9.3 with a normal differential hemoglobin 8.9. CMP was normal other than an albumin of 3. Her sedimentation rate was 24 and C-reactive protein 19. These are not remarkably high. We  could not get an x-ray of the left greater trochanter done in the facility for some reason so I will order this to be done in the [facility] facility so I can see it online. The patient does not complain of pain Electronic Signature(s) Signed: 07/02/2019 5:43:36 PM By: Baltazar Najjar MD Entered By: Baltazar Najjar on 07/02/2019 15:21:51 Banton, Montoya ([id number]) -------------------------------------------------------------------------------- Physical Exam Details Patient Name: Alice Wallace Date of Service: 07/02/2019 2:00 PM Medical Record Number: [id number] Patient Account Number: [id number] Date of Birth/Sex: January 07, 1941 (79 y.o. F) Treating RN: Huel Coventry Primary Care Provider: Devoria Glassing Other Clinician: Referring Provider: Devoria Glassing Treating Provider/Extender: Maxwell Caul Weeks in Treatment: 1 Constitutional Sitting or standing Blood Pressure is within target range for patient.. Pulse regular and within target range for patient.Marland Kitchen Respirations regular, non-labored and within target range.. Temperature is normal and within the target range for the patient.Marland Kitchen appears in no distress. Ears, Nose, Mouth, and Throat Patient is hard of hearing. However I think she can read lips adequately for communication. Respiratory Respiratory effort is easy and symmetric bilaterally. Rate is normal at rest and on room air.. Integumentary (Hair, Skin) No erythema around the wound.Marland Kitchen Psychiatric No evidence of depression, anxiety, or agitation. Calm, cooperative, and communicative. Appropriate interactions and affect.. Notes Wound exam; the patient has a deep wound over the posterior aspect of the left greater trochanter. The wound is really cleaned up quite nicely since last week but it is deepened down to periosteum. There is mild undermining here. There is no surrounding soft tissue infection no crepitus. Electronic Signature(s) Signed: 07/02/2019 5:43:36 PM By: Baltazar Najjar MD Entered  By: Leanord Hawking,  Shabre Kreher on 07/02/2019 15:23:12 Alice Wallace, Alice Wallace ([id number]) -------------------------------------------------------------------------------- Physician Orders Details Patient Name: [name], [name] Date of Service: 07/02/2019 2:00 PM Medical Record Number: [id number] Patient Account Number: [id number] Date of Birth/Sex: [birth year] (79 y.o. F) Treating RN: Huel Coventry Primary Care Provider: Devoria Glassing Other Clinician: Referring Provider: Devoria Glassing Treating Provider/Extender: Altamese [place] in Treatment: 1 Verbal / Phone Orders: No Diagnosis Coding Wound Cleansing Wound #1 Left Trochanter o Clean wound with Normal Saline. Anesthetic (add to Medication List) Wound #1 Left Trochanter o Topical Lidocaine 4% cream applied to wound bed prior to debridement (In Clinic Only). Primary Wound Dressing Wound #1 Left Trochanter o Santyl Ointment Secondary Dressing Wound #1 Left Trochanter o Saline moistened gauze - wet-to-dry o Boardered Foam Dressing Dressing Change Frequency Wound #1 Left Trochanter o Change dressing every day. Follow-up Appointments Wound #1 Left Trochanter o Return Appointment in 1 week. Off-Loading Wound #1 Left Trochanter o Turn and reposition every 2 hours - Keep pressure off of the wounded area. Additional Orders / Instructions Wound #1 Left Trochanter o Increase protein intake. Home Health Wound #1 Left Trochanter o Continue Home Health Visits - [facility] o Home Health Nurse may visit PRN to address patientos wound care needs. o FACE TO FACE ENCOUNTER: MEDICARE and MEDICAID PATIENTS: I certify that this patient is under my care and that I had a face-to-face encounter that meets the physician face-to-face encounter requirements with this patient on this date. The encounter with the patient was in whole or in part for the following MEDICAL CONDITION: (primary reason for Home Healthcare) MEDICAL NECESSITY: I  certify, that based on my Alice Wallace, Alice Wallace ([id number]) NURSING services are a medically necessary home health service. HOME BOUND STATUS: I certify that my clinical findings support that this patient is homebound (i.e., Due to illness or injury, pt requires aid of supportive devices such as crutches, cane, wheelchairs, walkers, the use of special transportation or the assistance of another person to leave their place of residence. There is a normal inability to leave the home and doing so requires considerable and taxing effort. Other absences are for medical reasons / religious services and are infrequent or of short duration when for other reasons). o If current dressing causes regression in wound condition, may D/C ordered dressing product/s and apply Normal Saline Moist Dressing daily until next Wound Healing Center / Other MD appointment. Notify Wound Healing Center of regression in wound condition at [phone]. o Please direct any NON-WOUND related issues/requests for orders to patient's Primary Care Physician Radiology o X-ray, other - Left Trochanter Electronic Signature(s) Signed: 07/02/2019 5:43:36 PM By: Baltazar Najjar MD Signed: 07/02/2019 5:56:57 PM By: Elliot Gurney, BSN, RN, CWS, Kim RN, BSN Entered By: Elliot Gurney, BSN, RN, CWS, Kim on 07/02/2019 14:52:44 Alice Wallace ([id number]) -------------------------------------------------------------------------------- Problem List Details Patient Name: Alice Wallace Date of Service: 07/02/2019 2:00 PM Medical Record Number: [id number] Patient Account Number: [id number] Date of Birth/Sex: June 27, 1940 (79 y.o. 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 F) Treating RN: Cornell Barman Primary Care Provider: Odessa Fleming Other Clinician: Referring Provider: Odessa Fleming Treating Provider/Extender: Tito Dine in Treatment: 1 Subjective History of Present Illness (HPI) ADMISSION 06/25/2019 This is a 79 year old woman who comes from the [place] skilled facility to [place] today for review of a pressure ulcer on her left hip. I really do not know much about this and I am not exactly certain how long this is been present. The patient was hospitalized from 1/16 through 1/19 at [facility] with Covid pneumonia all ended she is signed out is having a pressure ulcer on the left ischial tuberosity although there is very little information on this. She comes with an attendant from the facility. 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 Past medical history includes interstitial lung disease, L1 compression fracture, deafness, COPD, Covid pneumonia as described. Socially she has a DSS Education officer, museum we were not able to contact her before doing this review. My interaction with the patient and allowing her to read my lips I felt that the patient understood what I was saying about her wounds and the need for debridement. I therefore went ahead and treated these aggressively in the clinic. 2/10; patient came in with a necrotic wound on her left greater trochanter last week. Extensive debridement we use  Santyl. The wound is cleaned up quite nicely but goes down to periosteum. Lab work I ordered last week showed a white count of 9.3 with a normal differential hemoglobin 8.9. CMP was normal other than an albumin of 3. Her sedimentation rate was 24 and C-reactive protein 19. These are not remarkably high. We could not get an x-ray of the left greater trochanter done in the facility for some reason so I will order this to be done in the [facility] facility so I can see it online. The patient does not complain of pain Objective Constitutional Sitting or standing Blood Pressure is within target range for patient.. Pulse regular and within target range for patient.Marland Kitchen Respirations regular, non-labored and within target range.. Temperature is normal and within the target range for the patient.Marland Kitchen appears in no distress. Vitals Time Taken: 2:20 PM, Height: 64 in, Weight: 120 lbs, BMI: 20.6, Temperature: 97.5 F, Pulse: 97 bpm, Respiratory Rate: 18 breaths/min, Blood Pressure: 124/60 mmHg. Ears, Nose, Mouth, and Throat Patient is hard of hearing. However I think she can read lips adequately for communication. Respiratory Respiratory effort is easy and symmetric bilaterally. Rate is normal at rest and on room air.Alice Wallace, Alice Wallace ([id number]) Psychiatric No evidence of depression, anxiety, or agitation. Calm, cooperative, and communicative. Appropriate interactions and affect.. General Notes: Wound exam; the patient has a deep wound over the posterior aspect of the left greater trochanter. The wound is really cleaned up quite nicely since last week but it is deepened down to periosteum. There is mild undermining here. There is no surrounding soft tissue infection no crepitus. Integumentary (Hair, Skin) No erythema around the wound.. Wound #1 status is Open. Original cause of wound was Pressure Injury. The wound is located on the Left Trochanter. The wound measures 3cm length x 4.4cm width x 2.3cm depth;  10.367cm^2 area and 23.845cm^3 volume. There is Fat Layer (Subcutaneous Tissue) Exposed exposed. Tunneling has been noted at 10:00 with a maximum distance of 4.4cm. Undermining begins at 10:00 and ends at 2:00 with a maximum distance of 4cm. There is a medium amount  of serosanguineous drainage noted. Foul odor after cleansing was noted. The wound margin is flat and intact. There is small (1-33%) red granulation within the wound bed. There is a large (67-100%) amount of necrotic tissue within the wound bed including Adherent Slough. Assessment Active Problems ICD-10 Pressure ulcer of right hip, stage 4 Personal history of COVID-19 Plan Wound Cleansing: Wound #1 Left Trochanter: Clean wound with Normal Saline. Anesthetic (add to Medication List): Wound #1 Left Trochanter: Topical Lidocaine 4% cream applied to wound bed prior to debridement (In Clinic Only). Primary Wound Dressing: Wound #1 Left Trochanter: Santyl Ointment Secondary Dressing: Wound #1 Left Trochanter: Saline moistened gauze - wet-to-dry Boardered Foam Dressing Dressing Change Frequency: Wound #1 Left Trochanter: Change dressing every day. Follow-up Appointments: Wound #1 Left Trochanter: Return Appointment in 1 week. Off-Loading: Alice Wallace, Alice Wallace ([id number]) Wound #1 Left Trochanter: Turn and reposition every 2 hours - Keep pressure off of the wounded area. Additional Orders / Instructions: Wound #1 Left Trochanter: Increase protein intake. Home Health: Wound #1 Left Trochanter: Continue Home Health Visits - [facility] Home Health Nurse may visit PRN to address patient s wound care needs. FACE TO FACE ENCOUNTER: MEDICARE and MEDICAID PATIENTS: I certify that this patient is under my care and that I had a face-to-face encounter that meets the physician face-to-face encounter requirements with this patient on this date. The encounter with the patient was in whole or in part for the following MEDICAL CONDITION:  (primary reason for Home Healthcare) MEDICAL NECESSITY: I certify, that based on my findings, NURSING services are a medically necessary home health service. HOME BOUND STATUS: I certify that my clinical findings support that this patient is homebound (i.e., Due to illness or injury, pt requires aid of supportive devices such as crutches, cane, wheelchairs, walkers, the use of special transportation or the assistance of another person to leave their place of residence. There is a normal inability to leave the home and doing so requires considerable and taxing effort. Other absences are for medical reasons / religious services and are infrequent or of short duration when for other reasons). If current dressing causes regression in wound condition, may D/C ordered dressing product/s and apply Normal Saline Moist Dressing daily until next Wound Healing Center / Other MD appointment. Notify Wound Healing Center of regression in wound condition at [phone]. Please direct any NON-WOUND related issues/requests for orders to patient's Primary Care Physician Radiology ordered were: X-ray, other - Left Trochanter 1. The patient can read lips and I think we are communicating. I do not believe she does American sign but will try to make sure of that. 2. Her wound is cleaned up quite nicely this week but unfortunately it is down to periosteum and is a stage IV pressure wound 3. I would like a plain x-ray of this areao Osteomyelitis. She has mildly elevated inflammatory markers but not too bad. 4. She has healthy granulation and certainly I think a wound VAC is something that needs to be considered if she does not have active infection. We might be able to get enough granulation to cover this area and heal this wound. That of course presumes no active infection 5. Continue with Santyl back by wet-to-dry dressings for now Electronic Signature(s) Signed: 07/02/2019 5:43:36 PM By: Baltazar Najjar MD Entered  By: Baltazar Najjar on 07/02/2019 15:25:27 Alice Wallace, Alice Wallace ([id number]) -------------------------------------------------------------------------------- SuperBill Details Patient Name: Alice Wallace Date of Service: 07/02/2019 Medical Record Number: [id number] Patient Account Number: [id number] Date of Birth/Sex: [birth year] (79 y.o. 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

## 2019-07-03 NOTE — Progress Notes (Signed)
ALFRETTA, PINCH ([id number]) Visit Report for 07/02/2019 Arrival Information Details Patient Name: Alice Wallace, Alice Wallace Date of Service: 07/02/2019 2:00 PM Medical Record Number: [id number] Patient Account Number: [id number] Date of Birth/Sex: 01-24-1941 (79 y.o. F) Treating RN: Cornell Barman Primary Care Kyira Volkert: Odessa Fleming Other Clinician: Referring Jaryah Aracena: Odessa Fleming Treating Adyen Bifulco/Extender: Tito Dine in Treatment: 1 Visit Information History Since Last Visit Added or deleted any medications: No Patient Arrived: Walker Any new allergies or adverse reactions: No Arrival Time: 14:23 Had a fall or experienced change in No Accompanied By: caregiver activities of daily living that may affect Transfer Assistance: Manual risk of falls: Patient Identification Verified: Yes Signs or symptoms of abuse/neglect since last visito No Secondary Verification Process Completed: Yes Hospitalized since last visit: No Implantable device outside of the clinic excluding No cellular tissue based products placed in the center since last visit: Has Dressing in Place as Prescribed: Yes Has Compression in Place as Prescribed: Yes Pain Present Now: No Electronic Signature(s) Signed: 07/02/2019 3:52:22 PM By: Lorine Bears RCP, RRT, CHT Entered By: Lorine Bears on 07/02/2019 14:25:46 Pilling, Ellana ([id number]) -------------------------------------------------------------------------------- Clinic Level of Care Assessment Details Patient Name: Alice Wallace Date of Service: 07/02/2019 2:00 PM Medical Record Number: [id number] Patient Account Number: [id number] Date of Birth/Sex: November 17, 1940 (79 y.o. F) Treating RN: Cornell Barman Primary Care Ivin Rosenbloom: Odessa Fleming Other Clinician: Referring Jarely Juncaj: Odessa Fleming Treating Carnie Bruemmer/Extender: Tito Dine in Treatment: 1 Clinic Level of Care Assessment Items TOOL 4 Quantity Score []  - Use when  only an EandM is performed on FOLLOW-UP visit 0 ASSESSMENTS - Nursing Assessment / Reassessment []  - Reassessment of Co-morbidities (includes updates in patient status) 0 X- 1 5 Reassessment of Adherence to Treatment Plan ASSESSMENTS - Wound and Skin Assessment / Reassessment X - Simple Wound Assessment / Reassessment - one wound 1 5 []  - 0 Complex Wound Assessment / Reassessment - multiple wounds []  - 0 Dermatologic / Skin Assessment (not related to wound area) ASSESSMENTS - Focused Assessment []  - Circumferential Edema Measurements - multi extremities 0 []  - 0 Nutritional Assessment / Counseling / Intervention []  - 0 Lower Extremity Assessment (monofilament, tuning fork, pulses) []  - 0 Peripheral Arterial Disease Assessment (using hand held doppler) ASSESSMENTS - Ostomy and/or Continence Assessment and Care []  - Incontinence Assessment and Management 0 []  - 0 Ostomy Care Assessment and Management (repouching, etc.) PROCESS - Coordination of Care X - Simple Patient / Family Education for ongoing care 1 15 []  - 0 Complex (extensive) Patient / Family Education for ongoing care X- 1 10 Staff obtains Programmer, systems, Records, Test Results / Process Orders []  - 0 Staff telephones HHA, Nursing Homes / Clarify orders / etc []  - 0 Routine Transfer to another Facility (non-emergent condition) []  - 0 Routine Hospital Admission (non-emergent condition) []  - 0 New Admissions / Biomedical engineer / Ordering NPWT, Apligraf, etc. []  - 0 Emergency Hospital Admission (emergent condition) X- 1 10 Simple Discharge Coordination Garcon, Halimah ([id number]) []  - 0 Complex (extensive) Discharge Coordination PROCESS - Special Needs []  - Pediatric / Minor Patient Management 0 []  - 0 Isolation Patient Management []  - 0 Hearing / Language / Visual special needs []  - 0 Assessment of Community assistance (transportation, D/C planning, etc.) []  - 0 Additional assistance / Altered mentation []   - 0 Support Surface(s) Assessment (bed, cushion, seat, etc.) INTERVENTIONS - Wound Cleansing / Measurement X - Simple Wound Cleansing - one wound 1 5 []  - 0 Complex Wound Cleansing - multiple  wounds X- 1 5 Wound Imaging (photographs - any number of wounds) []  - 0 Wound Tracing (instead of photographs) X- 1 5 Simple Wound Measurement - one wound []  - 0 Complex Wound Measurement - multiple wounds INTERVENTIONS - Wound Dressings []  - Small Wound Dressing one or multiple wounds 0 []  - 0 Medium Wound Dressing one or multiple wounds X- 1 20 Large Wound Dressing one or multiple wounds []  - 0 Application of Medications - topical []  - 0 Application of Medications - injection INTERVENTIONS - Miscellaneous []  - External ear exam 0 []  - 0 Specimen Collection (cultures, biopsies, blood, body fluids, etc.) []  - 0 Specimen(s) / Culture(s) sent or taken to Lab for analysis []  - 0 Patient Transfer (multiple staff / / Similar devices) []  - 0 Simple Staple / Suture removal (25 or less) []  - 0 Complex Staple / Suture removal (26 or more) []  - 0 Hypo / Hyperglycemic Management (close monitor of Blood Glucose) []  - 0 Ankle / Brachial Index (ABI) - do not check if billed separately X- 1 5 Vital Signs Dorris, Shaylen ( ) Has the patient been seen at the hospital within the last three years: Yes Total Score: 85 Level Of Care: New/Established - Level 3 Electronic Signature(s) Signed: 07/02/2019 5:56:57 PM By: , BSN, RN, CWS, Kim RN, BSN Entered By: , BSN, RN, CWS, Kim on 07/02/2019 14:53:23 ( ) -------------------------------------------------------------------------------- Encounter Discharge Information Details Patient Name: Date of Service: 07/02/2019 2:00 PM Medical Record Number: Alice Wallace Patient Account Number: Date of Birth/Sex: Feb 10, 1941 (78 y.o. F) Treating RN: Primary Care Kemyra August: [id number] Other Clinician: Referring Mathews Stuhr: 08/30/2019 Treating Glanda Spanbauer/Extender: Elliot Gurney in Treatment: 1 Encounter Discharge Information Items Discharge Condition: Stable Ambulatory Status: Ambulatory Discharge Destination: Home Transportation: Private Auto Accompanied By: caregiver Schedule Follow-up Appointment: Yes Clinical Summary of Care: Electronic Signature(s) Signed: 07/02/2019 5:56:57 PM By: 08/30/2019, BSN, RN, CWS, Kim RN, BSN Entered By: [name], BSN, RN, CWS, Kim on 07/02/2019 14:54:18 [name] (08/30/2019) -------------------------------------------------------------------------------- Lower Extremity Assessment Details Patient Name: [id number] Date of Service: 07/02/2019 2:00 PM Medical Record Number: 12/26/1940 Patient Account Number: [id number] Date of Birth/Sex: 04/09/1941 (78 y.o. F) Treating RN: Devoria Glassing Primary Care Marynell Bies: Altamese [place] Other Clinician: Referring Treavor Blomquist: 08/30/2019 Treating Elliott Quade/Extender: Elliot Gurney Weeks in Treatment: 1 Electronic Signature(s) Signed: 07/02/2019 4:47:46 PM By: 08/30/2019 Entered By: [name] on 07/02/2019 14:30:22 Meckes, Mazzy ([name]) -------------------------------------------------------------------------------- Multi Wound Chart Details Patient Name: 08/30/2019 Date of Service: 07/02/2019 2:00 PM Medical Record Number: [id number] Patient Account Number: 12/26/1940 Date of Birth/Sex: August 04, 1940 (79 y.o. F) Treating RN: Devoria Glassing Primary Care Bernarr Longsworth: Devoria Glassing Other Clinician: Referring Niva Murren: Maxwell Caul Treating Sephora Boyar/Extender: 08/30/2019 in Treatment: 1 Vital Signs Height(in): 64 Pulse(bpm): 97 Weight(lbs): 120 Blood Pressure(mmHg): 124/60 Body Mass Index(BMI): 21 Temperature(F): 97.5 Respiratory Rate 18 (breaths/min): Photos: [N/A:N/A] Wound Location: Left Trochanter N/A N/A Wounding Event: Pressure Injury N/A  N/A Primary Etiology: Pressure Ulcer N/A N/A Comorbid History: Anemia, Chronic Obstructive N/A N/A Pulmonary Disease (COPD), Arrhythmia, Hypertension Date Acquired: 04/22/2019 N/A N/A Weeks of Treatment: 1 N/A N/A Wound Status: Open N/A N/A Measurements L x W x D 3x4.4x2.3 N/A N/A (cm) Area (cm) : 10.367 N/A N/A Volume (cm) : 23.845 N/A N/A % Reduction in Area: 15.40% N/A N/A % Reduction in Volume: -39.00% N/A N/A Position 1 (o'clock): 10 Maximum Distance 1 (cm): 4.4 Starting Position 1 10 (o'clock): Ending Position  1 2 (o'clock): Maximum Distance 1 (cm): 4 Tunneling: Yes N/A N/A Undermining: Yes N/A N/A Classification: Unstageable/Unclassified N/A N/A Exudate Amount: Medium N/A N/A Exudate Type: Serosanguineous N/A N/A Exudate Color: red, brown N/A N/A Foul Odor After Cleansing: Yes N/A N/A Junious, Peri ([id number]) Odor Anticipated Due to No N/A N/A Product Use: Wound Margin: Flat and Intact N/A N/A Granulation Amount: Small (1-33%) N/A N/A Granulation Quality: Red N/A N/A Necrotic Amount: Large (67-100%) N/A N/A Exposed Structures: Fat Layer (Subcutaneous N/A N/A Tissue) Exposed: Yes Fascia: No Tendon: No Muscle: No Joint: No Bone: No Epithelialization: None N/A N/A Treatment Notes Wound #1 (Left Trochanter) Notes Santyl, wet to dry, bordered foam Electronic Signature(s) Signed: 07/02/2019 5:43:36 PM By: Baltazar Najjar MD Entered By: Baltazar Najjar on 07/02/2019 15:20:12 [name] ([id number]) -------------------------------------------------------------------------------- Multi-Disciplinary Care Plan Details Patient Name: [name] Date of Service: 07/02/2019 2:00 PM Medical Record Number: [id number] Patient Account Number: [id number] Date of Birth/Sex: 1940-12-10 (79 y.o. F) Treating RN: Huel Coventry Primary Care Perl Kerney: Devoria Glassing Other Clinician: Referring Kolbi Altadonna: Devoria Glassing Treating Landrey Mahurin/Extender: Altamese [place] in  Treatment: 1 Active Inactive Medication Nursing Diagnoses: Knowledge deficit related to medication safety: actual or potential Goals: Patient/caregiver will demonstrate understanding of all current medications Date Initiated: 07/02/2019 Target Resolution Date: 07/31/2019 Goal Status: Active Interventions: Assess for medication contraindications each visit where new medications are prescribed Notes: Orientation to the Wound Care Program Nursing Diagnoses: Knowledge deficit related to the wound healing center program Goals: Patient/caregiver will verbalize understanding of the Wound Healing Center Program Date Initiated: 07/02/2019 Target Resolution Date: 07/23/2019 Goal Status: Active Interventions: Provide education on orientation to the wound center Notes: Pressure Nursing Diagnoses: Knowledge deficit related to management of pressures ulcers Goals: Patient will remain free of pressure ulcers Date Initiated: 07/02/2019 Target Resolution Date: 07/16/2019 Goal Status: Active Interventions: Provide education on pressure ulcers Eisman, [place] ([id number]) Notes: Wound/Skin Impairment Nursing Diagnoses: Impaired tissue integrity Knowledge deficit related to smoking impact on wound healing Goals: Patient/caregiver will verbalize understanding of skin care regimen Date Initiated: 07/02/2019 Target Resolution Date: 07/23/2019 Goal Status: Active Ulcer/skin breakdown will have a volume reduction of 30% by week 4 Date Initiated: 07/02/2019 Target Resolution Date: 07/30/2019 Goal Status: Active Interventions: Assess ulceration(s) every visit Provide education on ulcer and skin care Notes: Electronic Signature(s) Signed: 07/02/2019 5:56:57 PM By: Elliot Gurney, BSN, RN, CWS, Kim RN, BSN Entered By: Elliot Gurney, BSN, RN, CWS, Kim on 07/02/2019 14:50:53 Speigner, Linard Millers ([id number]) -------------------------------------------------------------------------------- Pain Assessment Details Patient Name:  [name] Date of Service: 07/02/2019 2:00 PM Medical Record Number: [id number] Patient Account Number: [id number] Date of Birth/Sex: 08/30/1940 (78 y.o. F) Treating RN: Huel Coventry Primary Care Dore Oquin: Devoria Glassing Other Clinician: Referring Betzabeth Derringer: Devoria Glassing Treating Evens Meno/Extender: Altamese [place] in Treatment: 1 Active Problems Location of Pain Severity and Description of Pain Patient Has Paino No Site Locations Pain Management and Medication Current Pain Management: Electronic Signature(s) Signed: 07/02/2019 3:52:22 PM By: Dayton Martes RCP, RRT, CHT Signed: 07/02/2019 5:56:57 PM By: Elliot Gurney, BSN, RN, CWS, Kim RN, BSN Entered By: Dayton Martes on 07/02/2019 14:25:54 [name] ([id number]) -------------------------------------------------------------------------------- Patient/Caregiver Education Details Patient Name: [name] Date of Service: 07/02/2019 2:00 PM Medical Record Number: [id number] Patient Account Number: [id number] Date of Birth/Gender: 05/02/1941 (78 y.o. F) Treating RN: Huel Coventry Primary Care Physician: Devoria Glassing Other Clinician: Referring Physician: Devoria Glassing Treating Physician/Extender: Altamese [place] in Treatment: 1 Education Assessment Education Provided To: Patient Education Topics Provided Pressure: Handouts: Pressure Ulcers:  Care and Offloading Methods: Demonstration, Explain/Verbal Responses: State content correctly Electronic Signature(s) Signed: 07/02/2019 5:56:57 PM By: Elliot Gurney, BSN, RN, CWS, Kim RN, BSN Entered By: Elliot Gurney, BSN, RN, CWS, Kim on 07/02/2019 14:53:44 [name] ([id number]) -------------------------------------------------------------------------------- Wound Assessment Details Patient Name: [name] Date of Service: 07/02/2019 2:00 PM Medical Record Number: [id number] Patient Account Number: [id number] Date of Birth/Sex: 08-04-1940 (78 y.o.  F) Treating RN: Rodell Perna Primary Care Judene Logue: Devoria Glassing Other Clinician: Referring Debroh Sieloff: Devoria Glassing Treating Glenmore Karl/Extender: Altamese [place] in Treatment: 1 Wound Status Wound Number: 1 Primary Pressure Ulcer Etiology: Wound Location: Left Trochanter Wound Open Wounding Event: Pressure Injury Status: Date Acquired: 04/22/2019 Comorbid Anemia, Chronic Obstructive Pulmonary Weeks Of Treatment: 1 History: Disease (COPD), Arrhythmia, Hypertension Clustered Wound: No Photos Wound Measurements Length: (cm) 3 % Reductio Width: (cm) 4.4 % Reductio Depth: (cm) 2.3 Epithelial Area: (cm) 10.367 Tunneling Volume: (cm) 23.845 Positi Maximum n in Area: 15.4% n in Volume: -39% ization: None : Yes on (o'clock): 10 Distance: (cm) 4.4 Undermining: Yes Starting Position (o'clock): 10 Ending Position (o'clock): 2 Maximum Distance: (cm) 4 Wound Description Classification: Unstageable/Unclassified Wound Margin: Flat and Intact Exudate Amount: Medium Exudate Type: Serosanguineous Exudate Color: red, brown Foul Odor After Cleansing: Yes Due to Product Use: No Slough/Fibrino Yes Wound Bed Granulation Amount: Small (1-33%) Exposed Structure Granulation Quality: Red Fascia Exposed: No Necrotic Amount: Large (67-100%) Fat Layer (Subcutaneous Tissue) Exposed: Yes Alcott, Anessa ([id number]) Necrotic Quality: Adherent Slough Tendon Exposed: No Muscle Exposed: No Joint Exposed: No Bone Exposed: No Treatment Notes Wound #1 (Left Trochanter) Notes Santyl, wet to dry, bordered foam Electronic Signature(s) Signed: 07/02/2019 4:47:46 PM By: Rodell Perna Entered By: Rodell Perna on 07/02/2019 14:30:12 Viruet, Kehaulani ([id number]) -------------------------------------------------------------------------------- Vitals Details Patient Name: [name] Date of Service: 07/02/2019 2:00 PM Medical Record Number: [id number] Patient Account Number:  [id number] Date of Birth/Sex: 1940-06-24 (79 y.o. F) Treating RN: Huel Coventry Primary Care Perl Folmar: Devoria Glassing Other Clinician: Referring Shantal Roan: Devoria Glassing Treating Keiyana Stehr/Extender: Altamese [place] in Treatment: 1 Vital Signs Time Taken: 14:20 Temperature (F): 97.5 Height (in): 64 Pulse (bpm): 97 Weight (lbs): 120 Respiratory Rate (breaths/min): 18 Body Mass Index (BMI): 20.6 Blood Pressure (mmHg): 124/60 Reference Range: 80 - 120 mg / dl Electronic Signature(s) Signed: 07/02/2019 3:52:22 PM By: Dayton Martes RCP, RRT, CHT Entered By: Dayton Martes on 07/02/2019 14:26:25

## 2019-07-04 ENCOUNTER — Other Ambulatory Visit: Payer: Self-pay | Admitting: Internal Medicine

## 2019-07-04 ENCOUNTER — Ambulatory Visit
Admission: RE | Admit: 2019-07-04 | Discharge: 2019-07-04 | Disposition: A | Discharge status: Home / Self Care | Payer: Medicare Other | Source: Ambulatory Visit | Attending: Internal Medicine | Admitting: Internal Medicine

## 2019-07-04 DIAGNOSIS — S81802A Unspecified open wound, left lower leg, initial encounter: Secondary | ICD-10-CM | POA: Insufficient documentation

## 2019-07-09 ENCOUNTER — Encounter: Payer: Medicare Other | Admitting: Internal Medicine

## 2019-07-09 ENCOUNTER — Other Ambulatory Visit: Payer: Self-pay

## 2019-07-09 DIAGNOSIS — L89214 Pressure ulcer of right hip, stage 4: Secondary | ICD-10-CM | POA: Diagnosis not present

## 2019-07-09 NOTE — Progress Notes (Signed)
Alice Wallace, Alice Wallace ([id number]) Visit Report for 07/09/2019 Arrival Information Details Patient Name: Alice Wallace, Alice Wallace Date of Service: 07/09/2019 1:45 PM Medical Record Number: [id number] Patient Account Number: [id number] Date of Birth/Sex: 1941-02-22 (79 y.o. F) Treating RN: Cornell Barman Primary Care Deontez Klinke: Odessa Fleming Other Clinician: Referring Tionne Carelli: Odessa Fleming Treating Ly Bacchi/Extender: Tito Dine in Treatment: 2 Visit Information History Since Last Visit Added or deleted any medications: No Patient Arrived: Walker Any new allergies or adverse reactions: No Arrival Time: 13:58 Had a fall or experienced change in No Accompanied By: caregiver activities of daily living that may affect Transfer Assistance: Manual risk of falls: Patient Identification Verified: Yes Signs or symptoms of abuse/neglect since last visito No Secondary Verification Process Completed: Yes Hospitalized since last visit: No Implantable device outside of the clinic excluding No cellular tissue based products placed in the center since last visit: Has Dressing in Place as Prescribed: Yes Pain Present Now: No Electronic Signature(s) Signed: 07/09/2019 4:13:39 PM By: Lorine Bears RCP, RRT, CHT Entered By: Lorine Bears on 07/09/2019 13:58:54 Alice Wallace, Alice Wallace ([id number]) -------------------------------------------------------------------------------- Clinic Level of Care Assessment Details Patient Name: [name] Date of Service: 07/09/2019 1:45 PM Medical Record Number: [id number] Patient Account Number: [id number] Date of Birth/Sex: 1940/09/13 (79 y.o. F) Treating RN: Cornell Barman Primary Care Tinslee Klare: Odessa Fleming Other Clinician: Referring Arles Rumbold: Odessa Fleming Treating Tamerra Merkley/Extender: Tito Dine in Treatment: 2 Clinic Level of Care Assessment Items TOOL 4 Quantity Score []  - Use when only an EandM is performed on FOLLOW-UP  visit 0 ASSESSMENTS - Nursing Assessment / Reassessment X - Reassessment of Co-morbidities (includes updates in patient status) 1 10 X- 1 5 Reassessment of Adherence to Treatment Plan ASSESSMENTS - Wound and Skin Assessment / Reassessment X - Simple Wound Assessment / Reassessment - one wound 1 5 []  - 0 Complex Wound Assessment / Reassessment - multiple wounds []  - 0 Dermatologic / Skin Assessment (not related to wound area) ASSESSMENTS - Focused Assessment []  - Circumferential Edema Measurements - multi extremities 0 []  - 0 Nutritional Assessment / Counseling / Intervention []  - 0 Lower Extremity Assessment (monofilament, tuning fork, pulses) []  - 0 Peripheral Arterial Disease Assessment (using hand held doppler) ASSESSMENTS - Ostomy and/or Continence Assessment and Care []  - Incontinence Assessment and Management 0 []  - 0 Ostomy Care Assessment and Management (repouching, etc.) PROCESS - Coordination of Care X - Simple Patient / Family Education for ongoing care 1 15 []  - 0 Complex (extensive) Patient / Family Education for ongoing care X- 1 10 Staff obtains Programmer, systems, Records, Test Results / Process Orders []  - 0 Staff telephones HHA, Nursing Homes / Clarify orders / etc []  - 0 Routine Transfer to another Facility (non-emergent condition) []  - 0 Routine Hospital Admission (non-emergent condition) []  - 0 New Admissions / Biomedical engineer / Ordering NPWT, Apligraf, etc. []  - 0 Emergency Hospital Admission (emergent condition) X- 1 10 Simple Discharge Coordination Maestre, Jenicka ([id number]) []  - 0 Complex (extensive) Discharge Coordination PROCESS - Special Needs []  - Pediatric / Minor Patient Management 0 []  - 0 Isolation Patient Management []  - 0 Hearing / Language / Visual special needs []  - 0 Assessment of Community assistance (transportation, D/C planning, etc.) []  - 0 Additional assistance / Altered mentation []  - 0 Support Surface(s) Assessment  (bed, cushion, seat, etc.) INTERVENTIONS - Wound Cleansing / Measurement X - Simple Wound Cleansing - one wound 1 5 []  - 0 Complex Wound Cleansing - multiple wounds X- 1 5 Wound Imaging (  photographs - any number of wounds) []  - 0 Wound Tracing (instead of photographs) X- 1 5 Simple Wound Measurement - one wound []  - 0 Complex Wound Measurement - multiple wounds INTERVENTIONS - Wound Dressings []  - Small Wound Dressing one or multiple wounds 0 []  - 0 Medium Wound Dressing one or multiple wounds X- 1 20 Large Wound Dressing one or multiple wounds []  - 0 Application of Medications - topical []  - 0 Application of Medications - injection INTERVENTIONS - Miscellaneous []  - External ear exam 0 []  - 0 Specimen Collection (cultures, biopsies, blood, body fluids, etc.) []  - 0 Specimen(s) / Culture(s) sent or taken to Lab for analysis []  - 0 Patient Transfer (multiple staff / / Similar devices) []  - 0 Simple Staple / Suture removal (25 or less) []  - 0 Complex Staple / Suture removal (26 or more) []  - 0 Hypo / Hyperglycemic Management (close monitor of Blood Glucose) []  - 0 Ankle / Brachial Index (ABI) - do not check if billed separately X- 1 5 Vital Signs Alice Wallace, Alice Wallace ( ) Has the patient been seen at the hospital within the last three years: Yes Total Score: 95 Level Of Care: New/Established - Level 3 Electronic Signature(s) Signed: 07/09/2019 5:27:48 PM By: , BSN, RN, CWS, Kim RN, BSN Entered By: , BSN, RN, CWS, Kim on 07/09/2019 14:19:41 ( ) -------------------------------------------------------------------------------- Encounter Discharge Information Details Patient Name: Date of Service: 07/09/2019 1:45 PM Medical Record Number: Nurse, adult Patient Account Number: Date of Birth/Sex: Jan 24, 1941 (79 y.o. F) Treating RN: Primary Care Keyaria Lawson: [id number] Other Clinician: Referring  Freya Zobrist: 07/11/2019 Treating Arletha Marschke/Extender: Elliot Gurney in Treatment: 2 Encounter Discharge Information Items Discharge Condition: Stable Ambulatory Status: Ambulatory Discharge Destination: Skilled Nursing Facility Orders Sent: Yes Transportation: Private Auto Accompanied By: caregiver Schedule Follow-up Appointment: Yes Clinical Summary of Care: Electronic Signature(s) Signed: 07/09/2019 5:27:48 PM By: 07/11/2019, BSN, RN, CWS, Kim RN, BSN Entered By: Alice Wallace, BSN, RN, CWS, Kim on 07/09/2019 14:20:36 Alice Wallace (07/11/2019) -------------------------------------------------------------------------------- Lower Extremity Assessment Details Patient Name: [id number] Date of Service: 07/09/2019 1:45 PM Medical Record Number: 12/26/1940 Patient Account Number: [id number] Date of Birth/Sex: Jun 25, 1940 (78 y.o. F) Treating RN: Devoria Glassing Primary Care Callum Wolf: Altamese Adair Other Clinician: Referring Aiman Sonn: 07/11/2019 Treating Ilaisaane Marts/Extender: Elliot Gurney Weeks in Treatment: 2 Electronic Signature(s) Signed: 07/09/2019 4:03:02 PM By: 07/11/2019 Entered By: Alice Wallace on 07/09/2019 14:03:12 Seivert, Keishawn (Alice Wallace) -------------------------------------------------------------------------------- Multi Wound Chart Details Patient Name: 07/11/2019 Date of Service: 07/09/2019 1:45 PM Medical Record Number: [id number] Patient Account Number: 12/26/1940 Date of Birth/Sex: [birth year] (80 y.o. F) Treating RN: Devoria Glassing Primary Care Jazmyn Offner: Devoria Glassing Other Clinician: Referring Chiana Wamser: Maxwell Caul Treating Bradden Tadros/Extender: 07/11/2019 in Treatment: 2 Vital Signs Height(in): 64 Pulse(bpm): 109 Weight(lbs): 120 Blood Pressure(mmHg): 141/66 Body Mass Index(BMI): 21 Temperature(F): 98.1 Respiratory Rate 20 (breaths/min): Photos: [N/A:N/A] Wound Location: Left Trochanter N/A N/A Wounding Event: Pressure Injury  N/A N/A Primary Etiology: Pressure Ulcer N/A N/A Comorbid History: Anemia, Chronic Obstructive N/A N/A Pulmonary Disease (COPD), Arrhythmia, Hypertension Date Acquired: 04/22/2019 N/A N/A Weeks of Treatment: 2 N/A N/A Wound Status: Open N/A N/A Measurements L x W x D 3.5x4.4x2.3 N/A N/A (cm) Area (cm) : 12.095 N/A N/A Volume (cm) : 27.819 N/A N/A % Reduction in Area: 1.30% N/A N/A % Reduction in Volume: -62.20% N/A N/A Position 1 (o'clock): 10 Maximum Distance 1 (cm): 3.4 Tunneling: Yes N/A N/A Classification: Category/Stage III N/A  N/A Exudate Amount: Medium N/A N/A Exudate Type: Serosanguineous N/A N/A Exudate Color: red, brown N/A N/A Foul Odor After Cleansing: Yes N/A N/A Odor Anticipated Due to No N/A N/A Product Use: Wound Margin: Flat and Intact N/A N/A Granulation Amount: Small (1-33%) N/A N/A Granulation Quality: Red N/A N/A Necrotic Amount: Large (67-100%) N/A N/A Alice Wallace, Alice Wallace ([id number]) Exposed Structures: Fat Layer (Subcutaneous N/A N/A Tissue) Exposed: Yes Fascia: No Tendon: No Muscle: No Joint: No Bone: No Epithelialization: None N/A N/A Treatment Notes Wound #1 (Left Trochanter) Notes Santyl, wet to dry, bordered foam Electronic Signature(s) Signed: 07/09/2019 5:08:45 PM By: Elliot Gurney, BSN, RN, CWS, Kim RN, BSN Previous Signature: 07/09/2019 4:30:10 PM Version By: Baltazar Najjar MD Entered By: Elliot Gurney, BSN, RN, CWS, Kim on 07/09/2019 17:08:44 Alice Wallace ([id number]) -------------------------------------------------------------------------------- Multi-Disciplinary Care Plan Details Patient Name: Alice Wallace Date of Service: 07/09/2019 1:45 PM Medical Record Number: [id number] Patient Account Number: [id number] Date of Birth/Sex: [birth year] (79 y.o. F) Treating RN: Huel Coventry Primary Care Tiawana Forgy: Devoria Glassing Other Clinician: Referring Kymoni Monday: Devoria Glassing Treating Tine Mabee/Extender: Altamese [place] in Treatment: 2 Active  Inactive Medication Nursing Diagnoses: Knowledge deficit related to medication safety: actual or potential Goals: Patient/caregiver will demonstrate understanding of all current medications Date Initiated: 07/02/2019 Target Resolution Date: 07/31/2019 Goal Status: Active Interventions: Assess for medication contraindications each visit where new medications are prescribed Notes: Orientation to the Wound Care Program Nursing Diagnoses: Knowledge deficit related to the wound healing center program Goals: Patient/caregiver will verbalize understanding of the Wound Healing Center Program Date Initiated: 07/02/2019 Target Resolution Date: 07/23/2019 Goal Status: Active Interventions: Provide education on orientation to the wound center Notes: Pressure Nursing Diagnoses: Knowledge deficit related to management of pressures ulcers Goals: Patient will remain free of pressure ulcers Date Initiated: 07/02/2019 Target Resolution Date: 07/16/2019 Goal Status: Active Interventions: Provide education on pressure ulcers Alice Wallace, Alice Wallace ([id number]) Notes: Wound/Skin Impairment Nursing Diagnoses: Impaired tissue integrity Knowledge deficit related to smoking impact on wound healing Goals: Patient/caregiver will verbalize understanding of skin care regimen Date Initiated: 07/02/2019 Target Resolution Date: 07/23/2019 Goal Status: Active Ulcer/skin breakdown will have a volume reduction of 30% by week 4 Date Initiated: 07/02/2019 Target Resolution Date: 07/30/2019 Goal Status: Active Interventions: Assess ulceration(s) every visit Provide education on ulcer and skin care Notes: Electronic Signature(s) Signed: 07/09/2019 5:27:48 PM By: Elliot Gurney, BSN, RN, CWS, Kim RN, BSN Entered By: Elliot Gurney, BSN, RN, CWS, Kim on 07/09/2019 14:14:52 Alice Wallace, Alice Wallace ([id number]) -------------------------------------------------------------------------------- Pain Assessment Details Patient Name: Alice Wallace Date  of Service: 07/09/2019 1:45 PM Medical Record Number: [id number] Patient Account Number: [id number] Date of Birth/Sex: [birth year] (78 y.o. F) Treating RN: Huel Coventry Primary Care Kerrigan Gombos: Devoria Glassing Other Clinician: Referring Hogan Hoobler: Devoria Glassing Treating Shaymus Eveleth/Extender: Altamese [place] in Treatment: 2 Active Problems Location of Pain Severity and Description of Pain Patient Has Paino No Site Locations Pain Management and Medication Current Pain Management: Electronic Signature(s) Signed: 07/09/2019 4:13:39 PM By: Dayton Martes RCP, RRT, CHT Signed: 07/09/2019 5:27:48 PM By: Elliot Gurney, BSN, RN, CWS, Kim RN, BSN Entered By: Dayton Martes on 07/09/2019 13:59:02 Alice Wallace ([id number]) -------------------------------------------------------------------------------- Patient/Caregiver Education Details Patient Name: Alice Wallace Date of Service: 07/09/2019 1:45 PM Medical Record Number: [id number] Patient Account Number: [id number] Date of Birth/Gender: 1941/02/09 (79 y.o. F) Treating RN: Huel Coventry Primary Care Physician: Devoria Glassing Other Clinician: Referring Physician: Devoria Glassing Treating Physician/Extender: Altamese [place] in Treatment: 2 Education Assessment Education Provided To: Patient Education Topics Provided Wound/Skin Impairment: Handouts: Caring for Your  Ulcer Methods: Demonstration, Explain/Verbal Responses: State content correctly Electronic Signature(s) Signed: 07/09/2019 5:27:48 PM By: Elliot Gurney, BSN, RN, CWS, Kim RN, BSN Entered By: Elliot Gurney, BSN, RN, CWS, Kim on 07/09/2019 14:19:56 Alice Wallace ([id number]) -------------------------------------------------------------------------------- Wound Assessment Details Patient Name: Alice Wallace Date of Service: 07/09/2019 1:45 PM Medical Record Number: [id number] Patient Account Number: [id number] Date of Birth/Sex: 1940-10-25 (78 y.o. F) Treating RN: Huel Coventry Primary Care Rosevelt Luu: Devoria Glassing Other Clinician: Referring Hanin Decook: Devoria Glassing Treating Mahitha Hickling/Extender: Altamese [place] in Treatment: 2 Wound Status Wound Number: 1 Primary Pressure Ulcer Etiology: Wound Location: Left Trochanter Wound Open Wounding Event: Pressure Injury Status: Date Acquired: 04/22/2019 Comorbid Anemia, Chronic Obstructive Pulmonary Weeks Of Treatment: 2 History: Disease (COPD), Arrhythmia, Hypertension Clustered Wound: No Photos Wound Measurements Length: (cm) 3.5 % Reduction Width: (cm) 4.4 % Reduction Depth: (cm) 2.3 Epitheliali Area: (cm) 12.095 Tunneling: Volume: (cm) 27.819 Positio Maximum in Area: 1.3% in Volume: -62.2% zation: None Yes n (o'clock): 10 Distance: (cm) 3.4 Undermining: No Wound Description Classification: Category/Stage III Foul Odor A Wound Margin: Flat and Intact Due to Prod Exudate Amount: Medium Slough/Fibr Exudate Type: Serosanguineous Exudate Color: red, brown fter Cleansing: Yes uct Use: No ino Yes Wound Bed Granulation Amount: Small (1-33%) Exposed Structure Granulation Quality: Red Fascia Exposed: No Necrotic Amount: Large (67-100%) Fat Layer (Subcutaneous Tissue) Exposed: Yes Necrotic Quality: Adherent Slough Tendon Exposed: No Muscle Exposed: No Joint Exposed: No Bone Exposed: No Alice Wallace, Alice Wallace ([id number]) Treatment Notes Wound #1 (Left Trochanter) Notes Santyl, wet to dry, bordered foam Electronic Signature(s) Signed: 07/09/2019 5:08:25 PM By: Elliot Gurney, BSN, RN, CWS, Kim RN, BSN Entered By: Elliot Gurney, BSN, RN, CWS, Kim on 07/09/2019 17:08:24 Alice Wallace ([id number]) -------------------------------------------------------------------------------- Vitals Details Patient Name: Alice Wallace Date of Service: 07/09/2019 1:45 PM Medical Record Number: [id number] Patient Account Number: [id number] Date of Birth/Sex: 1940/11/24 (79 y.o. F) Treating RN: Huel Coventry Primary Care Froylan Hobby:  Devoria Glassing Other Clinician: Referring Lafaye Mcelmurry: Devoria Glassing Treating Maylon Sailors/Extender: Altamese [place] in Treatment: 2 Vital Signs Time Taken: 13:55 Temperature (F): 98.1 Height (in): 64 Pulse (bpm): 109 Weight (lbs): 120 Respiratory Rate (breaths/min): 20 Body Mass Index (BMI): 20.6 Blood Pressure (mmHg): 141/66 Reference Range: 80 - 120 mg / dl Electronic Signature(s) Signed: 07/09/2019 4:13:39 PM By: Dayton Martes RCP, RRT, CHT Entered By: Dayton Martes on 07/09/2019 13:59:58

## 2019-07-16 ENCOUNTER — Encounter: Payer: Medicare Other | Admitting: Internal Medicine

## 2019-07-16 ENCOUNTER — Other Ambulatory Visit: Payer: Self-pay

## 2019-07-16 DIAGNOSIS — L89214 Pressure ulcer of right hip, stage 4: Secondary | ICD-10-CM | POA: Diagnosis not present

## 2019-07-16 NOTE — Progress Notes (Signed)
Alice Wallace, Alice Wallace ([id number]) Visit Report for 07/09/2019 HPI Details Patient Name: Alice Wallace, Alice Wallace Date of Service: 07/09/2019 1:45 PM Medical Record Number: [id number] Patient Account Number: [id number] Date of Birth/Sex: 04-Aug-1940 (79 y.o. F) Treating RN: Huel Coventry Primary Care Provider: Devoria Glassing Other Clinician: Referring Provider: Devoria Glassing Treating Provider/Extender: Altamese Fithian in Treatment: 2 History of Present Illness HPI Description: ADMISSION 06/25/2019 This is a 79 year old woman who comes from [facility] to [place] today for review of a pressure ulcer on her left hip. I really do not know much about this and I am not exactly certain how long this is been present. The patient was hospitalized from 1/16 through 1/19 at [facility] with Covid pneumonia all ended she is signed out is having a pressure ulcer on the left ischial tuberosity although there is very little information on this. She comes with an attendant from the facility. 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 Past medical history includes interstitial lung disease, L1 compression fracture, deafness, COPD, Covid pneumonia as described. Socially she has a DSS Child psychotherapist we were not able to contact her before doing this review. My interaction with the patient and allowing her to read my lips I felt that the patient understood what I was saying about her wounds and the need for debridement. I therefore went ahead and treated these aggressively in the clinic. 2/10; patient came in with a necrotic wound on her left greater trochanter last week. Extensive debridement we use Santyl. The wound is cleaned up quite nicely but goes down to periosteum. Lab work I ordered last week showed a white count of 9.3 with a normal differential hemoglobin 8.9. CMP was normal other than an albumin of 3. Her sedimentation rate was 24 and C-reactive protein 19. These are not remarkably high. We  could not get an x-ray of the left greater trochanter done in the facility for some reason so I will order this to be done in the [facility] facility so I can see it online. The patient does not complain of pain 2/17; deep pressure area on the left greater trochanter. The x-ray we obtained at [facility] health showed no specific radiographic evidence of osteomyelitis. Unfortunately the patient fell and fractured her left arm she has a cast on her arm Electronic Signature(s) Signed: 07/09/2019 4:30:10 PM By: Baltazar Najjar MD Entered By: Baltazar Najjar on 07/09/2019 14:40:47 Grondahl, Brielynn ([id number]) -------------------------------------------------------------------------------- Physical Exam Details Patient Name: Alice Wallace Date of Service: 07/09/2019 1:45 PM Medical Record Number: [id number] Patient Account Number: [id number] Date of Birth/Sex: 07-22-1940 (79 y.o. F) Treating RN: Huel Coventry Primary Care Provider: Devoria Glassing Other Clinician: Referring Provider: Devoria Glassing Treating Provider/Extender: Maxwell Caul Weeks in Treatment: 2 Constitutional Sitting or standing Blood Pressure is within target range for patient.. Pulse regular and within target range for patient.Marland Kitchen Respirations regular, non- labored and within target range.. Temperature is normal and within the target range for the patient.Marland Kitchen appears in no distress. Integumentary (Hair, Skin) There is no erythema around the wound and no crepitus. Psychiatric No evidence of depression, anxiety, or agitation. Calm, cooperative, and communicative. Appropriate interactions and affect.. Notes Wound exam; the patient has a deep punched out wound over the posterior aspect of the left greater trochanter. This goes down to periosteum and tunnel superiorly however I think in general things seem a little healthier looking than when she first came in. No debridement is required. Surrounding granulation tissue looks healthy although it  is  still possible to feel periosteum Electronic Signature(s) Signed: 07/09/2019 4:30:10 PM By: Linton Ham MD Entered By: Linton Ham on 07/09/2019 14:43:21 Jaroszewski, Alice Wallace ([id number]) -------------------------------------------------------------------------------- Physician Orders Details Patient Name: Alice Wallace Date of Service: 07/09/2019 1:45 PM Medical Record Number: [id number] Patient Account Number: [id number] Date of Birth/Sex: 1941-03-27 (79 y.o. F) Treating RN: Cornell Barman Primary Care Provider: Odessa Fleming Other Clinician: Referring Provider: Odessa Fleming Treating Provider/Extender: Tito Dine in Treatment: 2 Verbal / Phone Orders: No Diagnosis Coding Wound Cleansing Wound #1 Left Trochanter o Clean wound with Normal Saline. Anesthetic (add to Medication List) Wound #1 Left Trochanter o Topical Lidocaine 4% cream applied to wound bed prior to debridement (In Clinic Only). Primary Wound Dressing Wound #1 Left Trochanter o Santyl Ointment Secondary Dressing Wound #1 Left Trochanter o Saline moistened gauze - wet-to-dry o Boardered Foam Dressing Dressing Change Frequency Wound #1 Left Trochanter o Change dressing every day. Follow-up Appointments Wound #1 Left Trochanter o Return Appointment in 1 week. Off-Loading Wound #1 Left Trochanter o Turn and reposition every 2 hours - Keep pressure off of the wounded area. Additional Orders / Instructions Wound #1 Left Trochanter o Increase protein intake. Home Health Wound #1 Left [place] Nurse may visit PRN to address patientos wound care needs. o FACE TO FACE ENCOUNTER: MEDICARE and MEDICAID PATIENTS: I certify that this patient is under my care and that I had a face-to- face encounter that meets the physician face-to-face encounter requirements with this patient on this date. The encounter with the patient was  in whole or in part for the following MEDICAL CONDITION: (primary reason for [place]) MEDICAL NECESSITY: I certify, that based on my findings, NURSING services are a medically necessary home health service. HOME BOUND STATUS: I certify that my clinical findings support that this patient is homebound (i.e., Due to illness or injury, pt requires aid of supportive devices such as crutches, cane, wheelchairs, walkers, the use of special transportation or the assistance of another person to leave their place of residence. There is a normal inability to leave the home and doing so requires considerable and taxing effort. Other absences are for medical reasons / religious services and are infrequent or of short duration when for other reasons). o If current dressing causes regression in wound condition, may D/C ordered dressing product/s and apply Normal Saline Moist Dressing daily until next [place] / Other MD appointment. [place] of regression in wound condition at [phone]. o Please direct any NON-WOUND related issues/requests for orders to patient's Primary Care Physician [place], Ada ([phone]) Negative Pressure Wound Therapy Wound #1 Left Trochanter o Wound VAC settings at 125/130 mmHg continuous pressure. Use BLACK/GREEN foam to wound cavity. Use WHITE foam to fill any tunnel/s and/or undermining. Change VAC dressing 2 X WEEK. Change canister as indicated when full. Nurse may titrate settings and frequency of dressing changes as clinically indicated. - [facility] Nurse may d/c VAC for s/s of increased infection, significant wound regression, or uncontrolled drainage. [place] at [phone]. Electronic Signature(s) Signed: 07/09/2019 4:30:10 PM By: Linton Ham MD Signed: 07/09/2019 5:27:48 PM By: Gretta Cool, BSN, RN, CWS, Kim RN, BSN Entered By: Gretta Cool, BSN, RN, CWS, Kim on 07/09/2019 14:17:30 Alice Wallace ([id number]) -------------------------------------------------------------------------------- Problem List Details Patient Name: Alice Wallace Date of Service: 07/09/2019 1:45 PM Medical Record Number: [id number] Patient Account Number: [id number] Date of Birth/Sex: Jan 03, 1941 (78  y.o. F) Treating RN: Huel Coventry Primary Care Provider: Devoria Glassing Other Clinician: Referring Provider: Devoria Glassing Treating Provider/Extender: Altamese [place]field in Treatment: 2 Active Problems ICD-10 Evaluated Encounter Code Description Active Date Today Diagnosis L89.214 Pressure ulcer of right hip, stage 4 06/25/2019 No Yes Z86.16 Personal history of COVID-19 06/25/2019 No Yes Inactive Problems Resolved Problems Electronic Signature(s) Signed: 07/09/2019 4:30:10 PM By: Baltazar Najjar MD Entered By: Baltazar Najjar on 07/09/2019 14:38:56 Buccieri, Alice Wallace ([id number]) -------------------------------------------------------------------------------- Progress Note Details Patient Name: Alice Wallace Date of Service: 07/09/2019 1:45 PM Medical Record Number: [id number] Patient Account Number: [id number] Date of Birth/Sex: 11/15/1940 (79 y.o. F) Treating RN: Huel Coventry Primary Care Provider: Devoria Glassing Other Clinician: Referring Provider: Devoria Glassing Treating Provider/Extender: Altamese [place] in Treatment: 2 Subjective History of Present Illness (HPI) ADMISSION 06/25/2019 This is a 79 year old woman who comes from the [facility] facility to [place] today for review of a pressure ulcer on her left hip. I really do not know much about this and I am not exactly certain how long this is been present. The patient was hospitalized from 1/16 through 1/19 at [facility] with Covid pneumonia all ended she is signed out is having a pressure ulcer on the left ischial tuberosity although there is very little information on this. She comes with an attendant from the facility. She is deaf, can  write minimally but she can read lips. Past medical history includes interstitial lung disease, L1 compression fracture, deafness, COPD, Covid pneumonia as described. Socially she has a DSS Child psychotherapist we were not able to contact her before doing this review. My interaction with the patient and allowing her to read my lips I felt that the patient understood what I was saying about her wounds and the need for debridement. I therefore went ahead and treated these aggressively in the clinic. 2/10; patient came in with a necrotic wound on her left greater trochanter last week. Extensive debridement we use Santyl. The wound is cleaned up quite nicely but goes down to periosteum. Lab work I ordered last week showed a white count of 9.3 with a normal differential hemoglobin 8.9. CMP was normal other than an albumin of 3. Her sedimentation rate was 24 and C-reactive protein 19. These are not remarkably high. We could not get an x-ray of the left greater trochanter done in the facility for some reason so I will order this to be done in the [facility] facility so I can see it online. The patient does not complain of pain 2/17; deep pressure area on the left greater trochanter. The x-ray we obtained at [facility] health showed no specific radiographic evidence of osteomyelitis. Unfortunately the patient fell and fractured her left arm she has a cast on her arm Objective Constitutional Sitting or standing Blood Pressure is within target range for patient.. Pulse regular and within target range for patient.Marland Kitchen Respirations regular, non- labored and within target range.. Temperature is normal and within the target range for the patient.Marland Kitchen appears in no distress. Vitals Time Taken: 1:55 PM, Height: 64 in, Weight: 120 lbs, BMI: 20.6, Temperature: 98.1 F, Pulse: 109 bpm, Respiratory Rate: 20 breaths/min, Blood Pressure: 141/66 mmHg. Psychiatric No evidence of depression, anxiety, or agitation. Calm, cooperative, and  communicative. Appropriate interactions and affect.. General Notes: Wound exam; the patient has a deep punched out wound over the posterior aspect of the left greater trochanter. This goes down to periosteum and tunnel superiorly however I think in general things seem a little  healthier looking than when she first came in. No debridement is required. Surrounding granulation tissue looks healthy although it is still possible to feel periosteum Integumentary (Hair, Skin) There is no erythema around the wound and no crepitus. Wound #1 status is Open. Original cause of wound was Pressure Injury. The wound is located on the Left Trochanter. The wound measures 3.5cm length x 4.4cm width x 2.3cm depth; 12.095cm^2 area and 27.819cm^3 volume. There is Fat Layer (Subcutaneous Tissue) Exposed exposed. There is no undermining noted, however, there is tunneling at 10:00 with a maximum distance of 3.4cm. There is a medium amount of serosanguineous drainage noted. Foul odor after cleansing was noted. The wound margin is flat and intact. There is small (1-33%) red granulation within the wound bed. There is a large (67-100%) amount of necrotic tissue within the wound bed including [place]. Alice Wallace, Alice Wallace ([id number]) Assessment Active Problems ICD-10 Pressure ulcer of right hip, stage 4 Personal history of COVID-19 Plan Wound Cleansing: Wound #1 Left Trochanter: Clean wound with Normal Saline. Anesthetic (add to Medication List): Wound #1 Left Trochanter: Topical Lidocaine 4% cream applied to wound bed prior to debridement (In Clinic Only). Primary Wound Dressing: Wound #1 Left Trochanter: Santyl Ointment Secondary Dressing: Wound #1 Left Trochanter: Saline moistened gauze - wet-to-dry Boardered Foam Dressing Dressing Change Frequency: Wound #1 Left Trochanter: Change dressing every day. Follow-up Appointments: Wound #1 Left Trochanter: Return Appointment in 1 week. Off-Loading: Wound #1  Left Trochanter: Turn and reposition every 2 hours - Keep pressure off of the wounded area. Additional Orders / Instructions: Wound #1 Left Trochanter: Increase protein intake. Home Health: Wound #1 Left Trochanter: Continue Home Health Visits - [facility] Nurse may visit PRN to address patient s wound care needs. FACE TO FACE ENCOUNTER: MEDICARE and MEDICAID PATIENTS: I certify that this patient is under my care and that I had a face-to-face encounter that meets the physician face-to-face encounter requirements with this patient on this date. The encounter with the patient was in whole or in part for the following MEDICAL CONDITION: (primary reason for Home Healthcare) MEDICAL NECESSITY: I certify, that based on my findings, NURSING services are a medically necessary home health service. HOME BOUND STATUS: I certify that my clinical findings support that this patient is homebound (i.e., Due to illness or injury, pt requires aid of supportive devices such as crutches, cane, wheelchairs, walkers, the use of special transportation or the assistance of another person to leave their place of residence. There is a normal inability to leave the home and doing so requires considerable and taxing effort. Other absences are for medical reasons / religious services and are infrequent or of short duration when for other reasons). If current dressing causes regression in wound condition, may D/C ordered dressing product/s and apply Normal Saline Moist Dressing daily until next [facility] / Other MD appointment. [facility]ify [facility] of regression in wound condition at [phone]. Please direct any NON-WOUND related issues/requests for orders to patient's Primary Care Physician Negative Pressure Wound Therapy: Wound #1 Left Trochanter: Wound VAC settings at 125/130 mmHg continuous pressure. Use BLACK/GREEN foam to wound cavity. Use WHITE foam to fill any tunnel/s  and/or undermining. Change VAC dressing 2 X WEEK. Change canister as indicated when full. Nurse may titrate settings and frequency of dressing changes as clinically indicated. - Authorization Home Health Nurse may d/c VAC for s/s of increased infection, significant wound regression, or uncontrolled drainage. Notify Wound Healing Center at [phone]. 1. The  wound has generally improved however this is a deep stage IV wound with superior undermining over the greater trochanter. 2. No current evidence of infection and the x-ray did not show osteomyelitis. Alice Wallace, Alice Wallace ([id number]) 3. My sense of this is that this is ready for a trial of a wound VAC and we will [facility] 4. I emphasized to her the need to stay off this area and to eat properly. She expresses understanding Psychologist, prison and probation services) Signed: 07/09/2019 5:09:42 PM By: Elliot Gurney, BSN, RN, CWS, Kim RN, BSN Signed: 07/16/2019 5:01:35 PM By: Baltazar Najjar MD Previous Signature: 07/09/2019 4:30:10 PM Version By: Baltazar Najjar MD Entered By: Elliot Gurney BSN, RN, CWS, Kim on 07/09/2019 17:09:42 Alice Wallace ([id number]) -------------------------------------------------------------------------------- SuperBill Details Patient Name: Alice Wallace Date of Service: 07/09/2019 Medical Record Number: [id number] Patient Account Number: [id number] Date of Birth/Sex: 1940/09/25 (79 y.o. 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

## 2019-07-17 ENCOUNTER — Other Ambulatory Visit
Admission: RE | Admit: 2019-07-17 | Discharge: 2019-07-17 | Disposition: A | Discharge status: Home / Self Care | Payer: Medicare Other | Source: Ambulatory Visit | Attending: Physician Assistant | Admitting: Physician Assistant

## 2019-07-17 DIAGNOSIS — B999 Unspecified infectious disease: Secondary | ICD-10-CM | POA: Insufficient documentation

## 2019-07-20 LAB — AEROBIC CULTURE W GRAM STAIN (SUPERFICIAL SPECIMEN)

## 2019-07-23 ENCOUNTER — Encounter: Payer: Medicare Other | Attending: Internal Medicine | Admitting: Internal Medicine

## 2019-07-23 ENCOUNTER — Other Ambulatory Visit: Payer: Self-pay

## 2019-07-23 DIAGNOSIS — L89214 Pressure ulcer of right hip, stage 4: Secondary | ICD-10-CM | POA: Insufficient documentation

## 2019-07-23 DIAGNOSIS — Z881 Allergy status to other antibiotic agents status: Secondary | ICD-10-CM | POA: Diagnosis not present

## 2019-07-23 DIAGNOSIS — H9190 Unspecified hearing loss, unspecified ear: Secondary | ICD-10-CM | POA: Insufficient documentation

## 2019-07-23 DIAGNOSIS — J449 Chronic obstructive pulmonary disease, unspecified: Secondary | ICD-10-CM | POA: Diagnosis not present

## 2019-07-23 DIAGNOSIS — Z8616 Personal history of COVID-19: Secondary | ICD-10-CM | POA: Diagnosis not present

## 2019-07-23 DIAGNOSIS — I10 Essential (primary) hypertension: Secondary | ICD-10-CM | POA: Diagnosis not present

## 2019-07-23 NOTE — Progress Notes (Signed)
Alice LeveringDUPREE, Jaculin ([id number]) Visit Report for 07/23/2019 HPI Details Patient Name: Alice LeveringDUPREE, Miasha Date of Service: 07/23/2019 11:00 AM Medical Record Number: [id number] Patient Account Number: [id number] Date of Birth/Sex: 10/31/1940 (79 y.o. F) Treating RN: Huel CoventryWoody, Kim Primary Care Provider: Devoria GlassingLAMBERT, TRACEY Other Clinician: Referring Provider: Devoria GlassingLAMBERT, TRACEY Treating Provider/Extender: Altamese CarolinaOBSON, Averiana Clouatre G Weeks in Treatment: 4 History of Present Illness HPI Description: ADMISSION 06/25/2019 This is a 79 year old woman who comes from the [facility] to us today for review of a pressure ulcer on her left hip. I really do not know much about this and I am not exactly certain how long this is been present. The patient was hospitalized from 1/16 through 1/[facility] with [facility]id [facility] all ended she is signed out is having a pressure ulcer on the left ischial tuberosity although there is very little information on this. She comes with an attendant from the facility. 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 Past medical history includes interstitial lung disease, L1 compression fracture, deafness, COPD, Covid pneumonia as described. Socially she has a DSS Child psychotherapistsocial worker we were not able to contact her before doing this review. My interaction with the patient and allowing her to read my lips I felt that the patient understood what I was saying about her wounds and the need for debridement. I therefore went ahead and treated these aggressively in the clinic. 2/10; patient came in with a necrotic wound on her left greater trochanter last week. Extensive debridement we use Santyl. The wound is cleaned up quite nicely but goes down to periosteum. Lab work I ordered last week showed a white count of 9.3 with a normal differential hemoglobin 8.9. CMP was normal other than an albumin of 3. Her sedimentation rate was 24 and C-reactive protein 19. These are not remarkably high. We could  not get an x-ray of the left greater trochanter done in the facility for some reason so I will order this to be done in [facility] so I can see it online. The patient does not complain of pain 2/17; deep pressure area on the left greater trochanter. [facility] showed no specific radiographic evidence of osteomyelitis. Unfortunately the patient fell and fractured her left arm she has a cast on her arm 2/24; deep pressure area on the left greater trochanter. This is down to periosteum in this area and probes superiorly. There is some tissue here. We have a wound VAC and I think it is time to apply it. We will see how the wound reacts to this. There was a slight odor to the wound bed and I did a culture today but no empiric antibiotics. The patient is in an [facility] living [facility] 3/[facility]; deep pressure ulcer over the left greater trochanter. We put a wound VAC on this last week. She comes in today with a much improved wound. The wound is contracted and I think the undermining area is filled then. I could not easily detect any palpable bone. Culture from last time grew Pseudomonas and methicillin resistant staph aureus [MRSA]. This is never an easy combination. I gave her a combination of Cipro and doxycycline as she was allergic to Bactrim. Both of these will be for 7 days. Electronic Signature(s) Signed: 07/23/2019 4:59:37 PM By: Baltazar Najjarobson, Ethelyn Cerniglia MD Entered By: Baltazar Najjarobson, Xan Sparkman on 07/23/2019 11:44:21 Palmeri, Corrissa ([id number]) -------------------------------------------------------------------------------- Physical Exam Details Patient Name: Alice LeveringUPREE, Alysiah Date of Service: 07/23/2019 11:00  AM Medical Record Number: [id number] Patient Account Number: [id number] Date of Birth/Sex: 1941-04-20 (78 y.o. F) Treating RN: Cornell Barman Primary Care Provider: Odessa Fleming Other Clinician: Referring Provider: Odessa Fleming Treating Provider/Extender: Ricard Dillon Weeks in Treatment: 4 Constitutional Sitting or standing Blood Pressure is within target range for patient.. Pulse regular and within target range for patient.Marland Kitchen Respirations regular, non- labored and within target range.. Temperature is normal and within the target range for the patient.Marland Kitchen appears in no distress. Respiratory Respiratory effort is easy and symmetric bilaterally. Rate is normal at rest and on room air.. Integumentary (Hair, Skin) There is no erythema or crepitus around the wound. I think there is some candidal skin eruption here but this is mild.Marland Kitchen Psychiatric No evidence of depression, anxiety, or agitation. Calm, cooperative, and communicative. Appropriate interactions and affect.. Notes Wound exam; the patient's deep punched out area over the posterior aspect of the left greater trochanter appears a lot better. The wound is contracted it is no longer easy to get the periosteum and it is not easily visible. Electronic Signature(s) Signed: 07/23/2019 4:59:37 PM By: Linton Ham MD Entered By: Linton Ham on 07/23/2019 11:46:04 [name] ([id number]) -------------------------------------------------------------------------------- Physician Orders Details Patient Name: [name] Date of Service: 07/23/2019 11:00 AM Medical Record Number: [id number] Patient Account Number: [id number] Date of Birth/Sex: Aug 25, 1940 (79 y.o. F) Treating RN: Cornell Barman Primary Care Provider: Odessa Fleming Other Clinician: Referring Provider: Odessa Fleming Treating Provider/Extender: Tito Dine in Treatment: 4 Verbal / Phone Orders: No Diagnosis Coding Wound Cleansing Wound #1 Left Trochanter o Clean wound with Normal Saline. o anasept Anesthetic (add to Medication List) Wound #1 Left Trochanter o Topical Lidocaine 4% cream applied to wound bed prior to debridement (In Clinic Only). Primary Wound Dressing Wound #1 Left Trochanter o Saline  moistened gauze - wet-to-dry in clinic o Silver Collagen - under NPWT Secondary Dressing Wound #1 Left Trochanter o Boardered Foam Dressing - in clinic Dressing Change Frequency Wound #1 Left Trochanter o Change Dressing Monday, Wednesday, Friday Follow-up Appointments Wound #1 Left Trochanter o Return Appointment in 1 week. Off-Loading Wound #1 Left Trochanter o Turn and reposition every 2 hours o Other: - no pressure on wounded area Additional Orders / Instructions Wound #1 Left Trochanter o Increase protein intake. o Activity as tolerated Home Health Wound #1 Left Oriental Visits - [facility] o Home Health Nurse may visit PRN to address patientos wound care needs. o FACE TO FACE ENCOUNTER: MEDICARE and MEDICAID PATIENTS: I certify that this patient is under my care and that I had a face-to- face encounter that meets the physician face-to-face encounter requirements with this patient on this date. The encounter with the patient was in whole or in part for the following MEDICAL CONDITION: (primary reason for [place]) MEDICAL NECESSITY: I certify, that based on my findings, NURSING services are a medically necessary home health service. HOME BOUND STATUS: I certify that my clinical findings support that this patient is homebound (i.e., Due to illness or injury, pt requires aid of supportive devices such as crutches, cane, wheelchairs, walkers, the use of special transportation or the assistance of another person to leave their place of residence. There is a normal inability to leave the home and doing so requires considerable and taxing effort. Other absences are for medical reasons / religious services and are infrequent or of short duration when for other reasons). o If current dressing causes regression in wound condition, may D/C ordered  dressing product/s and apply Normal Saline Moist Dressing daily until next Wound Healing  Center / Other MD appointment. Notify Wound Healing Center of regression in wound condition at [phone]. JALAYNA, JOSTEN ([id number]) o Please direct any NON-WOUND related issues/requests for orders to patient's Primary Care Physician Negative Pressure Wound Therapy Wound #1 Left Trochanter o Wound VAC settings at 125/130 mmHg continuous pressure. Use BLACK/GREEN foam to wound cavity. Use WHITE foam to fill any tunnel/s and/or undermining. Change VAC dressing 3 X WEEK. Change canister as indicated when full. Nurse may titrate settings and frequency of dressing changes as clinically indicated. - Home health to place wound vac at facility. o Home Health Nurse may d/c VAC for s/s of increased infection, significant wound regression, or uncontrolled drainage. Notify Wound Healing Center at [phone]. o Apply contact layer over base of wound. - Silver Collagen under sponge at base of wound Electronic Signature(s) Signed: 07/23/2019 4:40:39 PM By: Elliot Gurney, BSN, RN, CWS, Kim RN, BSN Signed: 07/23/2019 4:59:37 PM By: Baltazar Najjar MD Entered By: Elliot Gurney, BSN, RN, CWS, Kim on 07/23/2019 11:33:50 Marini, Linard Millers ([id number]) -------------------------------------------------------------------------------- Problem List Details Patient Name: Alice Wallace Date of Service: 07/23/2019 11:00 AM Medical Record Number: [id number] Patient Account Number: [id number] Date of Birth/Sex: Jul 21, 1940 (79 y.o. 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 F) Treating RN: Huel Coventry Primary Care Provider: Devoria Glassing Other Clinician: Referring Provider: Devoria Glassing Treating Provider/Extender: Altamese [place] in Treatment: 4 Subjective History of Present Illness (HPI) ADMISSION 06/25/2019 This is a 79 year old woman who comes from the [facility] facility to [place] today for review of a pressure ulcer on her left hip. I really do not know much about this and I am not exactly certain how long this is been present. The patient was hospitalized from 1/16 through 1/19 at [facility] with Covid pneumonia all ended she is signed out is having a pressure ulcer on the left ischial tuberosity although there is very little information on this. She comes with an attendant from the facility. 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 Past medical history includes interstitial lung disease, L1 compression fracture, deafness, COPD, Covid pneumonia as described. Socially she has a DSS Child psychotherapist we were not able to contact her before doing this review. My interaction with the patient and allowing her to read my lips I felt that the patient understood what I was saying about her wounds and the need for debridement. I therefore went ahead and treated these aggressively in the clinic. 2/10; patient came in with a necrotic wound on her left greater trochanter last week. Extensive debridement we use Santyl. The wound is cleaned up quite nicely but goes down to periosteum. Lab work I ordered last week showed a white count of 9.3 with a normal differential hemoglobin 8.9. CMP was normal  other than an albumin of 3. Her sedimentation rate was 24 and  C-reactive protein 19. These are not remarkably high. We could not get an x-ray of the left greater trochanter done in the facility for some reason so I will order this to be done in the [facility] facility so I can see it online. The patient does not complain of pain 2/17; deep pressure area on the left greater trochanter. The x-ray we obtained at [facility] showed no specific radiographic evidence of osteomyelitis. Unfortunately the patient fell and fractured her left arm she has a cast on her arm 2/24; deep pressure area on the left greater trochanter. This is down to periosteum in this area and probes superiorly. There is some tissue here. We have a wound VAC and I think it is time to apply it. We will see how the wound reacts to this. There was a slight odor to the wound bed and I did a culture today but no empiric antibiotics. The patient is in an [facility] living [facility] 3/3; deep pressure ulcer over the left greater trochanter. We put a wound VAC on this last week. She comes in today with a much improved wound. The wound is contracted and I think the undermining area is filled then. I could not easily detect any palpable bone. Culture from last time grew Pseudomonas and methicillin resistant staph aureus [MRSA]. This is never an easy combination. I gave her a combination of Cipro and doxycycline as she was allergic to Bactrim. Both of these will be for 7 days. Objective Constitutional Sitting or standing Blood Pressure is within target range for patient.. Pulse regular and within target range for patient.Marland Kitchen Respirations regular, non- labored and within target range.. Temperature is normal and within the target range for the patient.Marland Kitchen appears in no distress. Vitals Time Taken: 10:54 AM, Height: 64 in, Weight: 120 lbs, BMI: 20.6, Temperature: 99.6 F, Pulse: 94 bpm, Respiratory Rate: 18 breaths/min, Blood Pressure:  124/44 mmHg. Respiratory Respiratory effort is easy and symmetric bilaterally. Rate is normal at rest and on room air.Marland Kitchen Psychiatric No evidence of depression, anxiety, or agitation. Calm, cooperative, and communicative. Appropriate interactions and affect.. General Notes: Wound exam; the patient's deep punched out area over the posterior aspect of the left greater trochanter appears a lot better. The wound is contracted it is no longer easy to get the periosteum and it is not easily visible. Brandel, Nixon ([id number]) Integumentary (Hair, Skin) There is no erythema or crepitus around the wound. I think there is some candidal skin eruption here but this is mild.. Wound #1 status is Open. Original cause of wound was Pressure Injury. The wound is located on the Left Trochanter. The wound measures 2.6cm length x 4.5cm width x 2.3cm depth; 9.189cm^2 area and 21.135cm^3 volume. There is Fat Layer (Subcutaneous Tissue) Exposed exposed. There is no tunneling or undermining noted. There is a large amount of serosanguineous drainage noted. Foul odor after cleansing was noted. The wound margin is flat and intact. There is small (1-33%) red granulation within the wound bed. There is a large (67-100%) amount of necrotic tissue within the wound bed including Adherent Slough. Assessment Active Problems ICD-10 Pressure ulcer of right hip, stage 4 Personal history of COVID-19 Plan Wound Cleansing: Wound #1 Left Trochanter: Clean wound with Normal Saline. anasept Anesthetic (add to Medication List): Wound #1 Left Trochanter: Topical Lidocaine 4% cream applied to wound bed prior to debridement (In Clinic Only). Primary Wound Dressing: Wound #1 Left Trochanter: Saline moistened gauze - wet-to-dry in clinic Silver Collagen -  under NPWT Secondary Dressing: Wound #1 Left Trochanter: Boardered Foam Dressing - in clinic Dressing Change Frequency: Wound #1 Left Trochanter: Change Dressing Monday,  Wednesday, Friday Follow-up Appointments: Wound #1 Left Trochanter: Return Appointment in 1 week. Off-Loading: Wound #1 Left Trochanter: Turn and reposition every 2 hours Other: - no pressure on wounded area Additional Orders / Instructions: Wound #1 Left Trochanter: Increase protein intake. Activity as tolerated Home Health: Wound #1 Left Trochanter: Continue Home Health Visits - [facility] Nurse may visit PRN to address patient s wound care needs. FACE TO FACE ENCOUNTER: MEDICARE and MEDICAID PATIENTS: I certify that this patient is under my care and that I had a face-to-face encounter that meets the physician face-to-face encounter requirements with this patient on this date. The encounter with the patient was in whole or in part for the following MEDICAL CONDITION: (primary reason for Home Healthcare) MEDICAL NECESSITY: I certify, that based on my findings, NURSING services are a medically necessary home health service. HOME BOUND STATUS: I certify that my clinical findings support that this patient is homebound (i.e., Due to illness or injury, pt requires aid of supportive devices such as crutches, cane, wheelchairs, walkers, the use of special transportation or the assistance of another person to leave their place of residence. There is a normal inability to leave the home and doing so requires considerable and taxing effort. Other absences are for medical reasons / religious services and are infrequent or of short duration when for other reasons). If current dressing causes regression in wound condition, may D/C ordered dressing product/s and apply Normal Saline Moist Dressing daily until next Wound Healing Center / Other MD appointment. Notify Wound Healing Center of regression in wound condition at [phone]. Please direct any NON-WOUND related issues/requests for orders to patient's Primary Care Physician [place], [place] ([id number]) Negative Pressure Wound  Therapy: Wound #1 Left Trochanter: Wound VAC settings at 125/130 mmHg continuous pressure. Use BLACK/GREEN foam to wound cavity. Use WHITE foam to fill any tunnel/s and/or undermining. Change VAC dressing 3 X WEEK. Change canister as indicated when full. Nurse may titrate settings and frequency of dressing changes as clinically indicated. - Home health to place wound vac at facility. Home Health Nurse may d/c VAC for s/s of increased infection, significant wound regression, or uncontrolled drainage. Notify Wound Healing Center at [phone]. Apply contact layer over base of wound. - Silver Collagen under sponge at base of wound 1. I think the patient has had a nice response to the wound VAC after 1 week. The wound is contracted. Subcutaneous tissue appears to have covered up the bony prominence that was easily visible. 2. Culture I did last week showed Pseudomonas and methicillin-resistant staph aureus. I thought it was important that we treat this for at least a week with antibiotics although there is no obvious surrounding soft tissue infection. We use doxycycline 100 twice daily Cipro 500 twice daily both for 1 week and will reassess at that point. 3. The patient has a mild candidal skin eruption which is not uncommon under a wound VAC however treating this with topical powder or creams often results in difficulty maintaining a seal. For now I am not going to recommend specific treatment for this. If this gets very much worse or very much more symptomatic I could consider oral Diflucan. Electronic Signature(s) Signed: 07/23/2019 4:59:37 PM By: Baltazar Najjar MD Entered By: Baltazar Najjar on 07/23/2019 11:49:04 Tappan, Maat ([id number]) -------------------------------------------------------------------------------- SuperBill Details Patient Name: Alice Wallace Date of Service:  07/23/2019 Medical Record Number: [id number] Patient Account Number: [id number] Date of Birth/Sex: [birth year] (79  y.o. 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

## 2019-07-23 NOTE — Progress Notes (Signed)
YARLIN, BREISCH ([id number]) Visit Report for 07/23/2019 Arrival Information Details Patient Name: Alice Wallace, Alice Wallace Date of Service: 07/23/2019 11:00 AM Medical Record Number: [id number] Patient Account Number: [id number] Date of Birth/Sex: Sep 09, 1940 (79 y.o. F) Treating RN: Montey Hora Primary Care Claudetta Sallie: Odessa Fleming Other Clinician: Referring Kree Rafter: Odessa Fleming Treating Montario Zilka/Extender: Tito Dine in Treatment: 4 Visit Information History Since Last Visit Added or deleted any medications: No Patient Arrived: Walker Any new allergies or adverse reactions: No Arrival Time: 10:48 Had a fall or experienced change in No Accompanied By: caregiver activities of daily living that may affect Transfer Assistance: None risk of falls: Patient Identification Verified: Yes Signs or symptoms of abuse/neglect since last visito No Secondary Verification Process Completed: Yes Hospitalized since last visit: No Implantable device outside of the clinic excluding No cellular tissue based products placed in the center since last visit: Has Dressing in Place as Prescribed: Yes Pain Present Now: No Electronic Signature(s) Signed: 07/23/2019 4:27:26 PM By: Montey Hora Entered By: Montey Hora on 07/23/2019 10:51:25 Alice Wallace, Alice Wallace ([id number]) -------------------------------------------------------------------------------- Clinic Level of Care Assessment Details Patient Name: Alice Wallace Date of Service: 07/23/2019 11:00 AM Medical Record Number: [id number] Patient Account Number: [id number] Date of Birth/Sex: January 20, 1941 (79 y.o. F) Treating RN: Cornell Barman Primary Care Cordarro Spinnato: Odessa Fleming Other Clinician: Referring Zulema Pulaski: Odessa Fleming Treating Luis Sami/Extender: Tito Dine in Treatment: 4 Clinic Level of Care Assessment Items TOOL 4 Quantity Score []  - Use when only an EandM is performed on FOLLOW-UP visit 0 ASSESSMENTS - Nursing Assessment /  Reassessment X - Reassessment of Co-morbidities (includes updates in patient status) 1 10 X- 1 5 Reassessment of Adherence to Treatment Plan ASSESSMENTS - Wound and Skin Assessment / Reassessment X - Simple Wound Assessment / Reassessment - one wound 1 5 []  - 0 Complex Wound Assessment / Reassessment - multiple wounds []  - 0 Dermatologic / Skin Assessment (not related to wound area) ASSESSMENTS - Focused Assessment []  - Circumferential Edema Measurements - multi extremities 0 []  - 0 Nutritional Assessment / Counseling / Intervention []  - 0 Lower Extremity Assessment (monofilament, tuning fork, pulses) []  - 0 Peripheral Arterial Disease Assessment (using hand held doppler) ASSESSMENTS - Ostomy and/or Continence Assessment and Care []  - Incontinence Assessment and Management 0 []  - 0 Ostomy Care Assessment and Management (repouching, etc.) PROCESS - Coordination of Care X - Simple Patient / Family Education for ongoing care 1 15 []  - 0 Complex (extensive) Patient / Family Education for ongoing care []  - 0 Staff obtains Programmer, systems, Records, Test Results / Process Orders []  - 0 Staff telephones HHA, Nursing Homes / Clarify orders / etc []  - 0 Routine Transfer to another Facility (non-emergent condition) []  - 0 Routine Hospital Admission (non-emergent condition) []  - 0 New Admissions / Biomedical engineer / Ordering NPWT, Apligraf, etc. []  - 0 Emergency Hospital Admission (emergent condition) X- 1 10 Simple Discharge Coordination []  - 0 Complex (extensive) Discharge Coordination PROCESS - Special Needs []  - Pediatric / Minor Patient Management 0 []  - 0 Isolation Patient Management []  - 0 Hearing / Language / Visual special needs []  - 0 Assessment of Community assistance (transportation, D/C planning, etc.) Alice Wallace, Alice Wallace ([id number]) []  - 0 Additional assistance / Altered mentation []  - 0 Support Surface(s) Assessment (bed, cushion, seat, etc.) INTERVENTIONS -  Wound Cleansing / Measurement X - Simple Wound Cleansing - one wound 1 5 []  - 0 Complex Wound Cleansing - multiple wounds X- 1 5 Wound Imaging (photographs - any number of  wounds) []  - 0 Wound Tracing (instead of photographs) X- 1 5 Simple Wound Measurement - one wound []  - 0 Complex Wound Measurement - multiple wounds INTERVENTIONS - Wound Dressings []  - Small Wound Dressing one or multiple wounds 0 X- 1 15 Medium Wound Dressing one or multiple wounds []  - 0 Large Wound Dressing one or multiple wounds []  - 0 Application of Medications - topical []  - 0 Application of Medications - injection INTERVENTIONS - Miscellaneous []  - External ear exam 0 []  - 0 Specimen Collection (cultures, biopsies, blood, body fluids, etc.) []  - 0 Specimen(s) / Culture(s) sent or taken to Lab for analysis []  - 0 Patient Transfer (multiple staff / / Similar devices) []  - 0 Simple Staple / Suture removal (25 or less) []  - 0 Complex Staple / Suture removal (26 or more) []  - 0 Hypo / Hyperglycemic Management (close monitor of Blood Glucose) []  - 0 Ankle / Brachial Index (ABI) - do not check if billed separately X- 1 5 Vital Signs Has the patient been seen at the hospital within the last three years: Yes Total Score: 80 Level Of Care: New/Established - Level 3 Electronic Signature(s) Signed: 07/23/2019 4:40:39 PM By: , BSN, RN, CWS, Kim RN, BSN Entered By: , BSN, RN, CWS, Kim on 07/23/2019 11:34:40 ( ) -------------------------------------------------------------------------------- Encounter Discharge Information Details Patient Name: Date of Service: 07/23/2019 11:00 AM Medical Record Number: Patient Account Number: Nurse, adult Date of Birth/Sex: 05/13/1941 (79 y.o. F) Treating RN: Primary Care Dakoda Bassette: Other Clinician: Referring Jennipher Weatherholtz: 09/22/2019 Treating Silvio Sausedo/Extender: Elliot Gurney in Treatment: 4 Encounter Discharge Information Items Discharge Condition: Stable Ambulatory Status: Walker Discharge Destination: Home Transportation: Private Auto Accompanied By: self Schedule Follow-up Appointment: Yes Clinical Summary of Care: Electronic Signature(s) Signed: 07/23/2019 4:40:39 PM By: 09/22/2019, BSN, RN, CWS, Kim RN, BSN Entered By: Alice Wallace, BSN, RN, CWS, Kim on 07/23/2019 11:38:39 Alice Wallace (09/22/2019) -------------------------------------------------------------------------------- Lower Extremity Assessment Details Patient Name: [id number] Date of Service: 07/23/2019 11:00 AM Medical Record Number: 12/26/1940 Patient Account Number: [id number] Date of Birth/Sex: May 16, 1941 (78 y.o. F) Treating RN: Devoria Glassing Primary Care Kumiko Fishman: Altamese Farina Other Clinician: Referring Eames Dibiasio: 09/22/2019 Treating Genene Kilman/Extender: Elliot Gurney in Treatment: 4 Electronic Signature(s) Signed: 07/23/2019 4:27:26 PM By: 09/22/2019 Entered By: Alice Wallace on 07/23/2019 10:51:41 Alice Wallace, Alice Wallace (Alice Wallace) -------------------------------------------------------------------------------- Multi Wound Chart Details Patient Name: 09/22/2019 Date of Service: 07/23/2019 11:00 AM Medical Record Number: [id number] Patient Account Number: 12/26/1940 Date of Birth/Sex: 1940-10-22 (79 y.o. F) Treating RN: Devoria Glassing Primary Care Johaan Ryser: Devoria Glassing Other Clinician: Referring Marwa Fuhrman: Altamese [place] Treating Swathi Dauphin/Extender: 09/22/2019 in Treatment: 4 Vital Signs Height(in): 64 Pulse(bpm): 94 Weight(lbs): 120 Blood Pressure(mmHg): 124/44 Body Mass Index(BMI): 21 Temperature(F): 99.6 Respiratory Rate(breaths/min): 18 Photos: [N/A:N/A] Wound Location: Left Trochanter N/A N/A Wounding Event: Pressure Injury N/A N/A Primary Etiology: Pressure Ulcer N/A N/A Comorbid History: Anemia, Chronic Obstructive N/A N/A Pulmonary  Disease (COPD), Arrhythmia, Hypertension Date Acquired: 04/22/2019 N/A N/A Weeks of Treatment: 4 N/A N/A Wound Status: Open N/A N/A Measurements L x W x D (cm) 2.6x4.5x2.3 N/A N/A Area (cm) : 9.189 N/A N/A Volume (cm) : 21.135 N/A N/A % Reduction in Area: 25.00% N/A N/A % Reduction in Volume: -23.20% N/A N/A Classification: Category/Stage III N/A N/A Exudate Amount: Large N/A N/A Exudate Type: Serosanguineous N/A N/A Exudate Color: red, brown N/A N/A Foul Odor After Cleansing: Yes N/A N/A Odor Anticipated Due  to Product No N/A N/A Use: Wound Margin: Flat and Intact N/A N/A Granulation Amount: Small (1-33%) N/A N/A Granulation Quality: Red N/A N/A Necrotic Amount: Large (67-100%) N/A N/A Exposed Structures: Fat Layer (Subcutaneous Tissue) N/A N/A Exposed: Yes Fascia: No Tendon: No Muscle: No Joint: No Bone: No Epithelialization: None N/A N/A Treatment Notes Wound #1 (Left Trochanter) Alice Wallace, Alice Wallace ([id number]) Notes wet-to-dry secured with bordered foam dressing Electronic Signature(s) Signed: 07/23/2019 4:59:37 PM By: Baltazar Najjar MD Entered By: Baltazar Najjar on 07/23/2019 11:41:26 Alice Wallace, Alice Wallace ([id number]) -------------------------------------------------------------------------------- Multi-Disciplinary Care Plan Details Patient Name: Alice Wallace Date of Service: 07/23/2019 11:00 AM Medical Record Number: [id number] Patient Account Number: [id number] Date of Birth/Sex: 1940-09-10 (79 y.o. F) Treating RN: Huel Coventry Primary Care Daine Gunther: Devoria Glassing Other Clinician: Referring Faylynn Stamos: Devoria Glassing Treating Blaize Nipper/Extender: Altamese [place] in Treatment: 4 Active Inactive Medication Nursing Diagnoses: Knowledge deficit related to medication safety: actual or potential Goals: Patient/caregiver will demonstrate understanding of all current medications Date Initiated: 07/02/2019 Target Resolution Date: 07/31/2019 Goal Status:  Active Interventions: Assess for medication contraindications each visit where new medications are prescribed Notes: Orientation to the Wound Care Program Nursing Diagnoses: Knowledge deficit related to the wound healing center program Goals: Patient/caregiver will verbalize understanding of the Wound Healing Center Program Date Initiated: 07/02/2019 Target Resolution Date: 07/23/2019 Goal Status: Active Interventions: Provide education on orientation to the wound center Notes: Pressure Nursing Diagnoses: Knowledge deficit related to management of pressures ulcers Goals: Patient will remain free of pressure ulcers Date Initiated: 07/02/2019 Target Resolution Date: 07/16/2019 Goal Status: Active Interventions: Provide education on pressure ulcers Notes: Wound/Skin Impairment Nursing Diagnoses: Impaired tissue integrity Knowledge deficit related to smoking impact on wound healing Alice Wallace, Alice Wallace ([id number]) Goals: Patient/caregiver will verbalize understanding of skin care regimen Date Initiated: 07/02/2019 Target Resolution Date: 07/23/2019 Goal Status: Active Ulcer/skin breakdown will have a volume reduction of 30% by week 4 Date Initiated: 07/02/2019 Target Resolution Date: 07/30/2019 Goal Status: Active Interventions: Assess ulceration(s) every visit Provide education on ulcer and skin care Notes: Electronic Signature(s) Signed: 07/23/2019 4:40:39 PM By: Elliot Gurney, BSN, RN, CWS, Kim RN, BSN Entered By: Elliot Gurney, BSN, RN, CWS, Kim on 07/23/2019 11:26:41 Alice Wallace ([id number]) -------------------------------------------------------------------------------- Pain Assessment Details Patient Name: Alice Wallace Date of Service: 07/23/2019 11:00 AM Medical Record Number: [id number] Patient Account Number: [id number] Date of Birth/Sex: 1940/10/18 (78 y.o. F) Treating RN: Curtis Sites Primary Care Nary Sneed: Devoria Glassing Other Clinician: Referring Viviane Semidey: Devoria Glassing Treating  Epifanio Labrador/Extender: Altamese [place] in Treatment: 4 Active Problems Location of Pain Severity and Description of Pain Patient Has Paino No Site Locations Pain Management and Medication Current Pain Management: Electronic Signature(s) Signed: 07/23/2019 4:27:26 PM By: Curtis Sites Entered By: Curtis Sites on 07/23/2019 10:51:34 Alice Wallace, Alice Wallace ([id number]) -------------------------------------------------------------------------------- Patient/Caregiver Education Details Patient Name: Alice Wallace Date of Service: 07/23/2019 11:00 AM Medical Record Number: [id number] Patient Account Number: [id number] Date of Birth/Gender: 1940-11-10 (79 y.o. F) Treating RN: Huel Coventry Primary Care Physician: Devoria Glassing Other Clinician: Referring Physician: Devoria Glassing Treating Physician/Extender: Altamese [place] in Treatment: 4 Education Assessment Education Provided To: Patient Education Topics Provided Wound/Skin Impairment: Handouts: Caring for Your Ulcer Methods: Demonstration, Explain/Verbal Responses: State content correctly Electronic Signature(s) Signed: 07/23/2019 4:40:39 PM By: Elliot Gurney, BSN, RN, CWS, Kim RN, BSN Entered By: Elliot Gurney, BSN, RN, CWS, Kim on 07/23/2019 11:34:54 Alice Wallace ([id number]) -------------------------------------------------------------------------------- Wound Assessment Details Patient Name: Alice Wallace Date of Service: 07/23/2019 11:00 AM Medical Record Number: [id number] Patient Account Number: [id number] Date of Birth/Sex: [birth year] (  79 y.o. F) Treating RN: Rodell Perna Primary Care Nour Scalise: Devoria Glassing Other Clinician: Referring Semya Klinke: Devoria Glassing Treating Loraine Bhullar/Extender: Altamese [place] in Treatment: 4 Wound Status Wound Number: 1 Primary Pressure Ulcer Etiology: Wound Location: Left Trochanter Wound Status: Open Wounding Event: Pressure Injury Comorbid Anemia, Chronic Obstructive Pulmonary Disease  (COPD), Date Acquired: 04/22/2019 History: Arrhythmia, Hypertension Weeks Of Treatment: 4 Clustered Wound: No Photos Wound Measurements Length: (cm) 2.6 Width: (cm) 4.5 Depth: (cm) 2.3 Area: (cm) 9.189 Volume: (cm) 21.135 % Reduction in Area: 25% % Reduction in Volume: -23.2% Epithelialization: None Tunneling: No Undermining: No Wound Description Classification: Category/Stage III Wound Margin: Flat and Intact Exudate Amount: Large Exudate Type: Serosanguineous Exudate Color: red, brown Foul Odor After Cleansing: Yes Due to Product Use: No Slough/Fibrino Yes Wound Bed Granulation Amount: Small (1-33%) Exposed Structure Granulation Quality: Red Fascia Exposed: No Necrotic Amount: Large (67-100%) Fat Layer (Subcutaneous Tissue) Exposed: Yes Necrotic Quality: Adherent Slough Tendon Exposed: No Muscle Exposed: No Joint Exposed: No Bone Exposed: No Treatment Notes Wound #1 (Left Trochanter) Notes wet-to-dry secured with bordered foam dressing Electronic Signature(s) Signed: 07/23/2019 11:20:56 AM By: Alice Wallace, Alice Wallace ([id number]) Entered By: Rodell Perna on 07/23/2019 10:57:25 Alice Wallace, Alice Wallace ([id number]) -------------------------------------------------------------------------------- Vitals Details Patient Name: Alice Wallace Date of Service: 07/23/2019 11:00 AM Medical Record Number: [id number] Patient Account Number: [id number] Date of Birth/Sex: 1941/04/26 (79 y.o. F) Treating RN: Curtis Sites Primary Care Trindon Dorton: Devoria Glassing Other Clinician: Referring Conroy Goracke: Devoria Glassing Treating Maris Abascal/Extender: Altamese [place] in Treatment: 4 Vital Signs Time Taken: 10:54 Temperature (F): 99.6 Height (in): 64 Pulse (bpm): 94 Weight (lbs): 120 Respiratory Rate (breaths/min): 18 Body Mass Index (BMI): 20.6 Blood Pressure (mmHg): 124/44 Reference Range: 80 - 120 mg / dl Electronic Signature(s) Signed: 07/23/2019 4:27:26 PM By: Curtis Sites Entered By: Curtis Sites on 07/23/2019 10:54:38

## 2019-07-25 NOTE — Progress Notes (Signed)
EMELYNN, RANCE ([id number]) Visit Report for 07/16/2019 Arrival Information Details Patient Name: Alice Wallace, Alice Wallace Date of Service: 07/16/2019 9:30 AM Medical Record Number: [id number] Patient Account Number: [id number] Date of Birth/Sex: 1940/06/25 (79 y.o. F) Treating RN: Rodell Perna Primary Care Jonavon Trieu: Devoria Glassing Other Clinician: Referring Letonia Stead: Devoria Glassing Treating Avett Reineck/Extender: Altamese [place] in Treatment: 3 Visit Information History Since Last Visit Added or deleted any medications: No Patient Arrived: Walker Any new allergies or adverse reactions: No Arrival Time: 09:39 Had a fall or experienced change in No Accompanied By: caregiver activities of daily living that may affect Transfer Assistance: None risk of falls: Patient Identification Verified: Yes Signs or symptoms of abuse/neglect since last visito No Hospitalized since last visit: No Has Dressing in Place as Prescribed: Yes Pain Present Now: No Electronic Signature(s) Signed: 07/16/2019 4:52:55 PM By: Rodell Perna Entered By: Rodell Perna on 07/16/2019 09:40:01 Malay, Indya ([id number]) -------------------------------------------------------------------------------- Encounter Discharge Information Details Patient Name: Alice Wallace Date of Service: 07/16/2019 9:30 AM Medical Record Number: [id number] Patient Account Number: [id number] Date of Birth/Sex: 1940/10/15 (79 y.o. F) Treating RN: Curtis Sites Primary Care Ledarius Leeson: Devoria Glassing Other Clinician: Referring Oveda Dadamo: Devoria Glassing Treating Caralyn Twining/Extender: Altamese [place] in Treatment: 3 Encounter Discharge Information Items Discharge Condition: Stable Ambulatory Status: Walker Discharge Destination: Home Transportation: Private Auto Accompanied By: caregiver Schedule Follow-up Appointment: Yes Clinical Summary of Care: Electronic Signature(s) Signed: 07/16/2019 10:45:02 AM By: Curtis Sites Entered By:  Curtis Sites on 07/16/2019 10:45:01 Nicks, Avery ([id number]) -------------------------------------------------------------------------------- Lower Extremity Assessment Details Patient Name: Alice Wallace Date of Service: 07/16/2019 9:30 AM Medical Record Number: [id number] Patient Account Number: [id number] Date of Birth/Sex: 11/19/1940 (78 y.o. F) Treating RN: Rodell Perna Primary Care Koreen Lizaola: Devoria Glassing Other Clinician: Referring Kamree Wiens: Devoria Glassing Treating Avian Konigsberg/Extender: Maxwell Caul Weeks in Treatment: 3 Electronic Signature(s) Signed: 07/16/2019 4:52:55 PM By: Rodell Perna Entered By: Rodell Perna on 07/16/2019 09:46:08 Rothschild, Tanner ([id number]) -------------------------------------------------------------------------------- Multi Wound Chart Details Patient Name: Alice Wallace Date of Service: 07/16/2019 9:30 AM Medical Record Number: [id number] Patient Account Number: [id number] Date of Birth/Sex: [birth year] (79 y.o. F) Treating RN: Huel Coventry Primary Care Tazaria Dlugosz: Devoria Glassing Other Clinician: Referring Desjuan Stearns: Devoria Glassing Treating Daisy Mcneel/Extender: Altamese [place] in Treatment: 3 Vital Signs Height(in): 64 Pulse(bpm): 72 Weight(lbs): 120 Blood Pressure(mmHg): 135/58 Body Mass Index(BMI): 21 Temperature(F): 98.4 Respiratory Rate(breaths/min): 16 Photos: [N/A:N/A] Wound Location: Left Trochanter N/A N/A Wounding Event: Pressure Injury N/A N/A Primary Etiology: Pressure Ulcer N/A N/A Comorbid History: Anemia, Chronic Obstructive N/A N/A Pulmonary Disease (COPD), Arrhythmia, Hypertension Date Acquired: 04/22/2019 N/A N/A Weeks of Treatment: 3 N/A N/A Wound Status: Open N/A N/A Measurements L x W x D (cm) 3.3x4.5x2 N/A N/A Area (cm) : 11.663 N/A N/A Volume (cm) : 23.326 N/A N/A % Reduction in Area: 4.80% N/A N/A % Reduction in Volume: -36.00% N/A N/A Starting Position 1 (o'clock): 9 Ending Position 1 (o'clock):  11 Maximum Distance 1 (cm): 3.7 Undermining: Yes N/A N/A Classification: Category/Stage III N/A N/A Exudate Amount: Large N/A N/A Exudate Type: Serosanguineous N/A N/A Exudate Color: red, brown N/A N/A Foul Odor After Cleansing: Yes N/A N/A Odor Anticipated Due to Product No N/A N/A Use: Wound Margin: Flat and Intact N/A N/A Granulation Amount: Small (1-33%) N/A N/A Granulation Quality: Red N/A N/A Necrotic Amount: Large (67-100%) N/A N/A Exposed Structures: Fat Layer (Subcutaneous Tissue) N/A N/A Exposed: Yes Fascia: No Tendon: No Muscle: No Joint: No Bone: No Epithelialization: None N/A N/A Procedures Performed: N/A Alice Wallace, Aerielle ([id number])  Negative Pressure Wound Therapy Application (NPWT) Treatment Notes Electronic Signature(s) Signed: 07/16/2019 5:01:35 PM By: Baltazar Najjar MD Entered By: Baltazar Najjar on 07/16/2019 10:07:00 Alice Wallace ([id number]) -------------------------------------------------------------------------------- Multi-Disciplinary Care Plan Details Patient Name: Alice Wallace Date of Service: 07/16/2019 9:30 AM Medical Record Number: [id number] Patient Account Number: [id number] Date of Birth/Sex: 1940/07/06 (79 y.o. F) Treating RN: Huel Coventry Primary Care Carmine Carrozza: Devoria Glassing Other Clinician: Referring Nakyiah Kuck: Devoria Glassing Treating Davan Hark/Extender: Altamese [place] in Treatment: 3 Active Inactive Medication Nursing Diagnoses: Knowledge deficit related to medication safety: actual or potential Goals: Patient/caregiver will demonstrate understanding of all current medications Date Initiated: 07/02/2019 Target Resolution Date: 07/31/2019 Goal Status: Active Interventions: Assess for medication contraindications each visit where new medications are prescribed Notes: Orientation to the Wound Care Program Nursing Diagnoses: Knowledge deficit related to the wound healing center program Goals: Patient/caregiver will  verbalize understanding of the Wound Healing Center Program Date Initiated: 07/02/2019 Target Resolution Date: 07/23/2019 Goal Status: Active Interventions: Provide education on orientation to the wound center Notes: Pressure Nursing Diagnoses: Knowledge deficit related to management of pressures ulcers Goals: Patient will remain free of pressure ulcers Date Initiated: 07/02/2019 Target Resolution Date: 07/16/2019 Goal Status: Active Interventions: Provide education on pressure ulcers Notes: Wound/Skin Impairment Nursing Diagnoses: Impaired tissue integrity Knowledge deficit related to smoking impact on wound healing Wallace, Alice ([id number]) Goals: Patient/caregiver will verbalize understanding of skin care regimen Date Initiated: 07/02/2019 Target Resolution Date: 07/23/2019 Goal Status: Active Ulcer/skin breakdown will have a volume reduction of 30% by week 4 Date Initiated: 07/02/2019 Target Resolution Date: 07/30/2019 Goal Status: Active Interventions: Assess ulceration(s) every visit Provide education on ulcer and skin care Notes: Electronic Signature(s) Signed: 07/25/2019 5:40:44 PM By: Elliot Gurney, BSN, RN, CWS, Kim RN, BSN Entered By: Elliot Gurney, BSN, RN, CWS, Kim on 07/16/2019 10:00:48 Landen, Charle ([id number]) -------------------------------------------------------------------------------- Negative Pressure Wound Therapy Application (NPWT) Details Patient Name: Alice Wallace Date of Service: 07/16/2019 9:30 AM Medical Record Number: [id number] Patient Account Number: [id number] Date of Birth/Sex: 09-15-1940 (79 y.o. F) Treating RN: Huel Coventry Primary Care Marcellina Jonsson: Devoria Glassing Other Clinician: Referring Joniel Graumann: Devoria Glassing Treating Corliss Coggeshall/Extender: Altamese [place] in Treatment: 3 NPWT Application Performed for: Wound #1 Left Trochanter Performed By: Huel Coventry, RN Type: Other Coverage Size (sq cm): 14.85 Pressure Type: Constant Pressure Setting: 125  mmHG Drain Type: None Primary Contact: Silver Sponge/Dressing Type: Foam, Black Date Initiated: 07/16/2019 Post Procedure Diagnosis Same as Pre-procedure Electronic Signature(s) Signed: 07/25/2019 5:40:44 PM By: Elliot Gurney, BSN, RN, CWS, Kim RN, BSN Entered By: Elliot Gurney, BSN, RN, CWS, Kim on 07/16/2019 10:05:39 Alice Wallace ([id number]) -------------------------------------------------------------------------------- Pain Assessment Details Patient Name: Alice Wallace Date of Service: 07/16/2019 9:30 AM Medical Record Number: [id number] Patient Account Number: [id number] Date of Birth/Sex: 1941-03-03 (78 y.o. F) Treating RN: Rodell Perna Primary Care Flavia Bruss: Devoria Glassing Other Clinician: Referring Jermiyah Ricotta: Devoria Glassing Treating Glenroy Crossen/Extender: Altamese [place] in Treatment: 3 Active Problems Location of Pain Severity and Description of Pain Patient Has Paino No Site Locations Pain Management and Medication Current Pain Management: Electronic Signature(s) Signed: 07/16/2019 4:52:55 PM By: Rodell Perna Entered By: Rodell Perna on 07/16/2019 09:40:24 Milner, Mazal ([id number]) -------------------------------------------------------------------------------- Patient/Caregiver Education Details Patient Name: Alice Wallace Date of Service: 07/16/2019 9:30 AM Medical Record Number: [id number] Patient Account Number: [id number] Date of Birth/Gender: February 17, 1941 (79 y.o. F) Treating RN: Huel Coventry Primary Care Physician: Devoria Glassing Other Clinician: Referring Physician: Devoria Glassing Treating Physician/Extender: Altamese [place] in Treatment: 3 Education Assessment Education Provided To: Patient Education Topics  Provided Pressure: Handouts: Preventing Pressure Ulcers Methods: Demonstration, Explain/Verbal Responses: State content correctly Wound/Skin Impairment: Handouts: Caring for Your Ulcer Methods: Demonstration, Explain/Verbal Responses: State content  correctly Electronic Signature(s) Signed: 07/25/2019 5:40:44 PM By: Gretta Cool, BSN, RN, CWS, Kim RN, BSN Entered By: Gretta Cool, BSN, RN, CWS, Kim on 07/16/2019 10:10:32 Alice Wallace ([id number]) -------------------------------------------------------------------------------- Wound Assessment Details Patient Name: Alice Wallace Date of Service: 07/16/2019 9:30 AM Medical Record Number: [id number] Patient Account Number: [id number] Date of Birth/Sex: [birth year] (78 y.o. F) Treating RN: Cornell Barman Primary Care Easton Fetty: Odessa Fleming Other Clinician: Referring Donnetta Gillin: Odessa Fleming Treating Elika Godar/Extender: Tito Dine in Treatment: 3 Wound Status Wound Number: 1 Primary Pressure Ulcer Etiology: Wound Location: Left Trochanter Wound Status: Open Wounding Event: Pressure Injury Comorbid Anemia, Chronic Obstructive Pulmonary Disease (COPD), Date Acquired: 04/22/2019 History: Arrhythmia, Hypertension Weeks Of Treatment: 3 Clustered Wound: No Photos Wound Measurements Length: (cm) 3.3 Width: (cm) 4.5 Depth: (cm) 2 Area: (cm) 11.663 Volume: (cm) 23.326 % Reduction in Area: 4.8% % Reduction in Volume: -36% Epithelialization: None Tunneling: No Undermining: Yes Starting Position (o'clock): 9 Ending Position (o'clock): 11 Maximum Distance: (cm) 3.7 Wound Description Classification: Category/Stage III Wound Margin: Flat and Intact Exudate Amount: Large Exudate Type: Serosanguineous Exudate Color: red, brown Foul Odor After Cleansing: Yes Due to Product Use: No Slough/Fibrino Yes Wound Bed Granulation Amount: Small (1-33%) Exposed Structure Granulation Quality: Red Fascia Exposed: No Necrotic Amount: Large (67-100%) Fat Layer (Subcutaneous Tissue) Exposed: Yes Necrotic Quality: Adherent Slough Tendon Exposed: No Muscle Exposed: No Joint Exposed: No Bone Exposed: No Electronic Signature(s) Signed: 07/25/2019 5:40:44 PM By: Gretta Cool, BSN, RN, CWS, Kim RN,  BSN Entered By: Gretta Cool, BSN, RN, CWS, Kim on 07/16/2019 10:11:34 Alice Wallace ([id number]) Audry Riles, Kasondra ([id number]) -------------------------------------------------------------------------------- [place] Details Patient Name: Alice Wallace Date of Service: 07/16/2019 9:30 AM Medical Record Number: [id number] Patient Account Number: [id number] Date of Birth/Sex: 1941/04/19 (78 y.o. F) Treating RN: Army Melia Primary Care Nyisha Clippard: Odessa Fleming Other Clinician: Referring Sheyenne Konz: Odessa Fleming Treating Shaquoia Miers/Extender: Tito Dine in Treatment: 3 Vital Signs Time Taken: 09:40 Temperature (F): 98.4 Height (in): 64 Pulse (bpm): 72 Weight (lbs): 120 Respiratory Rate (breaths/min): 16 Body Mass Index (BMI): 20.6 Blood Pressure (mmHg): 135/58 Reference Range: 80 - 120 mg / dl Electronic Signature(s) Signed: 07/16/2019 4:52:55 PM By: Army Melia Entered By: Army Melia on 07/16/2019 09:40:19

## 2019-07-25 NOTE — Progress Notes (Signed)
Alice Wallace, Alice Wallace ([id number]) Visit Report for 07/16/2019 HPI Details Patient Name: Alice Wallace, Alice Wallace Date of Service: 07/16/2019 9:30 AM Medical Record Number: [id number] Patient Account Number: [id number] Date of Birth/Sex: 05/12/1941 (79 y.o. F) Treating RN: Huel Coventry Primary Care Provider: Devoria Glassing Other Clinician: Referring Provider: Devoria Glassing Treating Provider/Extender: Altamese [place] in Treatment: 3 History of Present Illness HPI Description: ADMISSION 06/25/2019 This is a 79 year old woman who comes from the [place] skilled facility to [place] today for review of a pressure ulcer on her left hip. I really do not know much about this and I am not exactly certain how long this is been present. The patient was hospitalized from 1/16 through 1/19 at [facility] with Covid pneumonia all ended she is signed out is having a pressure ulcer on the left ischial tuberosity although there is very little information on this. She comes with an attendant from the facility. 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 Past medical history includes interstitial lung disease, L1 compression fracture, deafness, COPD, Covid pneumonia as described. Socially she has a DSS Child psychotherapist we were not able to contact her before doing this review. My interaction with the patient and allowing her to read my lips I felt that the patient understood what I was saying about her wounds and the need for debridement. I therefore went ahead and treated these aggressively in the clinic. 2/10; patient came in with a necrotic wound on her left greater trochanter last week. Extensive debridement we use Santyl. The wound is cleaned up quite nicely but goes down to periosteum. Lab work I ordered last week showed a white count of 9.3 with a normal differential hemoglobin 8.9. CMP was normal other than an albumin of 3. Her sedimentation rate was 24 and C-reactive protein 19. These are not remarkably high. We  could not get an x-ray of the left greater trochanter done in the facility for some reason so I will order this to be done in the [facility] facility so I can see it online. The patient does not complain of pain 2/17; deep pressure area on the left greater trochanter. The x-ray we obtained at [facility] health showed no specific radiographic evidence of osteomyelitis. Unfortunately the patient fell and fractured her left arm she has a cast on her arm 2/24; deep pressure area on the left greater trochanter. This is down to periosteum in this area and probes superiorly. There is some tissue here. We have a wound VAC and I think it is time to apply it. We will see how the wound reacts to this. There was a slight odor to the wound bed and I did a culture today but no empiric antibiotics. The patient is in an [facility] living [facility] Electronic Signature(s) Signed: 07/16/2019 5:01:35 PM By: Baltazar Najjar MD Entered By: Baltazar Najjar on 07/16/2019 10:08:04 Alice Wallace ([id number]) -------------------------------------------------------------------------------- Physical Exam Details Patient Name: Alice Wallace Date of Service: 07/16/2019 9:30 AM Medical Record Number: [id number] Patient Account Number: [id number] Date of Birth/Sex: 1941/01/30 (79 y.o. F) Treating RN: Huel Coventry Primary Care Provider: Devoria Glassing Other Clinician: Referring Provider: Devoria Glassing Treating Provider/Extender: Altamese [place] in Treatment: 3 Notes Wound exam; the patient has a deep punched-out area over the posterior aspect of the left greater trochanter catheter there is periosteum and tunneling superiorly. The wound is healthier than when she first came in but still I am uncertain about how this will react to wound VAC.  We are going to put collagen at the base of this. There was a slight odor present. No surrounding tissue erythema Electronic Signature(s) Signed: 07/16/2019 5:01:35 PM By:  Baltazar Najjar MD Entered By: Baltazar Najjar on 07/16/2019 10:08:59 Alice Wallace ([id number]) -------------------------------------------------------------------------------- Physician Orders Details Patient Name: Alice Wallace Date of Service: 07/16/2019 9:30 AM Medical Record Number: [id number] Patient Account Number: [id number] Date of Birth/Sex: 11/21/1940 (79 y.o. F) Treating RN: Huel Coventry Primary Care Provider: Devoria Glassing Other Clinician: Referring Provider: Devoria Glassing Treating Provider/Extender: Altamese [place] in Treatment: 3 Verbal / Phone Orders: No Diagnosis Coding Wound Cleansing Wound #1 Left Trochanter o Clean wound with Normal Saline. Anesthetic (add to Medication List) Wound #1 Left Trochanter o Topical Lidocaine 4% cream applied to wound bed prior to debridement (In Clinic Only). Primary Wound Dressing Wound #1 Left Trochanter o Silver Collagen Dressing Change Frequency Wound #1 Left Trochanter o Change Dressing Monday, Wednesday, Friday Follow-up Appointments Wound #1 Left Trochanter o Return Appointment in 1 week. Off-Loading Wound #1 Left Trochanter o Turn and reposition every 2 hours o Other: - no pressure on wounded area Additional Orders / Instructions Wound #1 Left Trochanter o Increase protein intake. o Activity as tolerated Home Health Wound #1 Left Trochanter o Continue Home Health Visits - [facility] o Home Health Nurse may visit PRN to address patientos wound care needs. o FACE TO FACE ENCOUNTER: MEDICARE and MEDICAID PATIENTS: I certify that this patient is under my care and that I had a face-to- face encounter that meets the physician face-to-face encounter requirements with this patient on this date. The encounter with the patient was in whole or in part for the following MEDICAL CONDITION: (primary reason for Home Healthcare) MEDICAL NECESSITY: I certify, that based on my findings, NURSING  services are a medically necessary home health service. HOME BOUND STATUS: I certify that my clinical findings support that this patient is homebound (i.e., Due to illness or injury, pt requires aid of supportive devices such as crutches, cane, wheelchairs, walkers, the use of special transportation or the assistance of another person to leave their place of residence. There is a normal inability to leave the home and doing so requires considerable and taxing effort. Other absences are for medical reasons / religious services and are infrequent or of short duration when for other reasons). o If current dressing causes regression in wound condition, may D/C ordered dressing product/s and apply Normal Saline Moist Dressing daily until next Wound Healing Center / Other MD appointment. Notify Wound Healing Center of regression in wound condition at [phone]. o Please direct any NON-WOUND related issues/requests for orders to patient's Primary Care Physician Negative Pressure Wound Therapy Wound #1 Left Trochanter Alice Wallace, Alice Wallace ([id number]) o Wound VAC settings at 125/130 mmHg continuous pressure. Use BLACK/GREEN foam to wound cavity. Use WHITE foam to fill any tunnel/s and/or undermining. Change VAC dressing 3 X WEEK. Change canister as indicated when full. Nurse may titrate settings and frequency of dressing changes as clinically indicated. o Home Health Nurse may d/c VAC for s/s of increased infection, significant wound regression, or uncontrolled drainage. Notify Wound Healing Center at [phone]. o Apply contact layer over base of wound. - Silver Collagen under sponge at base of wound Laboratory o Bacteria identified in Wound by Culture (MICRO) - trochanter oooo LOINC Code: 6462-6 oooo Convenience Name: Wound culture routine Electronic Signature(s) Signed: 07/16/2019 5:01:35 PM By: Baltazar Najjar MD Signed: 07/25/2019 5:40:44 PM By: Elliot Gurney, BSN, RN, CWS, Kim RN, BSN Entered  By: Gretta Cool, BSN, RN, CWS, Kim on 07/16/2019 10:15:09 Alice Wallace ([id number]) -------------------------------------------------------------------------------- Problem List Details Patient Name: Alice Wallace Date of Service: 07/16/2019 9:30 AM Medical Record Number: [id number] Patient Account Number: [id number] Date of Birth/Sex: 10/12/1940 (78 y.o. 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 F) Treating RN: Cornell Barman Primary Care Provider: Odessa Fleming Other Clinician: Referring Provider: Odessa Fleming Treating Provider/Extender: Tito Dine in Treatment: 3 Subjective History of Present Illness (HPI) ADMISSION 06/25/2019 This is a 79 year old woman who comes from the [place] skilled facility to [place] today for review of a pressure ulcer on her left hip. I really do not know much about this and I am not exactly certain how long this is been present. The patient was hospitalized from 1/16 through 1/19 at [facility] with Covid pneumonia all ended she is signed out is having a pressure ulcer on the left ischial tuberosity although there is very little information on this.  She comes with an attendant from the facility. 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 Past medical history includes interstitial lung disease, L1 compression fracture, deafness, COPD, Covid pneumonia as described. Socially she has a DSS Education officer, museum we were not able to contact her before doing this review. My interaction with the patient and allowing her to read my lips I felt that the patient understood what I was saying about her wounds and the need for debridement. I therefore went ahead and treated these aggressively in the clinic. 2/10; patient came in with a necrotic wound on her left greater trochanter last week. Extensive debridement we use Santyl. The wound is cleaned up quite nicely but goes down to periosteum. Lab work I ordered last week showed a white count of 9.3 with a normal differential hemoglobin 8.9. CMP was normal other than an albumin of 3. Her sedimentation rate was 24 and C-reactive protein 19. These are not remarkably high. We could not get an x-ray of the left greater trochanter done in the facility for some reason so I will order this to be done in the [facility] facility so I can see it online. The patient does not complain of pain 2/17; deep pressure area on the left greater trochanter. The x-ray we obtained at [facility] health showed no specific radiographic evidence of osteomyelitis. Unfortunately the patient fell and fractured her left arm she has a cast on her arm 2/24; deep pressure area on the left greater trochanter. This is down to periosteum in this area and probes superiorly. There is some tissue here. We have a wound VAC and I think it is time to apply it. We will see how the wound reacts to this. There was a slight odor to the wound bed and I did a culture today but no empiric antibiotics. The patient is in an assisted living [facility] Objective Constitutional Vitals Time Taken: 9:40 AM, Height:  64 in, Weight: 120 lbs, BMI: 20.6,  Temperature: 98.4 F, Pulse: 72 bpm, Respiratory Rate: 16 breaths/min, Blood Pressure: 135/58 mmHg. Integumentary (Hair, Skin) Wound #1 status is Open. Original cause of wound was Pressure Injury. The wound is located on the Left Trochanter. The wound measures 3.3cm length x 4.5cm width x 2cm depth; 11.663cm^2 area and 23.326cm^3 volume. There is Fat Layer (Subcutaneous Tissue) Exposed exposed. There is no tunneling noted, however, there is undermining starting at 9:00 and ending at 11:00 with a maximum distance of 3.7cm. There is a large amount of serosanguineous drainage noted. Foul odor after cleansing was noted. The wound margin is flat and intact. There is small (1-33%) red granulation within the wound bed. There is a large (67-100%) amount of necrotic tissue within the wound bed including Adherent Slough. Assessment Active Problems ICD-10 Pressure ulcer of right hip, stage 4 Personal history of COVID-19 Alice Wallace, Alice Wallace ([id number]) Plan Wound Cleansing: Wound #1 Left Trochanter: Clean wound with Normal Saline. Anesthetic (add to Medication List): Wound #1 Left Trochanter: Topical Lidocaine 4% cream applied to wound bed prior to debridement (In Clinic Only). Primary Wound Dressing: Wound #1 Left Trochanter: Silver Collagen Dressing Change Frequency: Wound #1 Left Trochanter: Change Dressing Monday, Wednesday, Friday Follow-up Appointments: Wound #1 Left Trochanter: Return Appointment in 1 week. Off-Loading: Wound #1 Left Trochanter: Turn and reposition every 2 hours Other: - no pressure on wounded area Additional Orders / Instructions: Wound #1 Left Trochanter: Increase protein intake. Activity as tolerated Home Health: Wound #1 Left Trochanter: Continue Home Health Visits Home Health Nurse may visit PRN to address patient s wound care needs. FACE TO FACE ENCOUNTER: MEDICARE and MEDICAID PATIENTS: I certify that this patient is under my care and that I had a  face-to-face encounter that meets the physician face-to-face encounter requirements with this patient on this date. The encounter with the patient was in whole or in part for the following MEDICAL CONDITION: (primary reason for Home Healthcare) MEDICAL NECESSITY: I certify, that based on my findings, NURSING services are a medically necessary home health service. HOME BOUND STATUS: I certify that my clinical findings support that this patient is homebound (i.e., Due to illness or injury, pt requires aid of supportive devices such as crutches, cane, wheelchairs, walkers, the use of special transportation or the assistance of another person to leave their place of residence. There is a normal inability to leave the home and doing so requires considerable and taxing effort. Other absences are for medical reasons / religious services and are infrequent or of short duration when for other reasons). If current dressing causes regression in wound condition, may D/C ordered dressing product/s and apply Normal Saline Moist Dressing daily until next Wound Healing Center / Other MD appointment. Notify Wound Healing Center of regression in wound condition at [phone]. Please direct any NON-WOUND related issues/requests for orders to patient's Primary Care Physician Negative Pressure Wound Therapy: Wound #1 Left Trochanter: Wound VAC settings at 125/130 mmHg continuous pressure. Use BLACK/GREEN foam to wound cavity. Use WHITE foam to fill any tunnel/s and/or undermining. Change VAC dressing 3 X WEEK. Change canister as indicated when full. Nurse may titrate settings and frequency of dressing changes as clinically indicated. Apply contact layer over base of wound. - Silver Collagen under sponge at base of wound 1. Silver collagen to the base of the wound 2. Wound VAC applied change 3 times a week [[facility]] 3. Continued weekly follow-up of this area at least for now 4.  There was an odor to  this today I did a culture. No empiric antibiotics. Electronic Signature(s) Signed: 07/16/2019 5:01:35 PM By: Baltazar Najjar MD Entered By: Baltazar Najjar on 07/16/2019 10:10:17 Alice Wallace ([id number]) -------------------------------------------------------------------------------- SuperBill Details Patient Name: Alice Wallace Date of Service: 07/16/2019 Medical Record Number: [id number] Patient Account Number: [id number] Date of Birth/Sex: 02/09/1941 (79 y.o. 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

## 2019-07-30 ENCOUNTER — Other Ambulatory Visit: Payer: Self-pay

## 2019-07-30 ENCOUNTER — Encounter: Payer: Medicare Other | Admitting: Internal Medicine

## 2019-07-30 DIAGNOSIS — L89214 Pressure ulcer of right hip, stage 4: Secondary | ICD-10-CM | POA: Diagnosis not present

## 2019-07-30 NOTE — Progress Notes (Signed)
Alice Wallace, Alice Wallace ([id number]) Visit Report for 07/30/2019 HPI Details Patient Name: Alice Wallace, Alice Wallace Date of Service: 07/30/2019 1:00 PM Medical Record Number: [id number] Patient Account Number: [id number] Date of Birth/Sex: [birth year] (79 y.o. F) Treating RN: Huel Coventry Primary Care Provider: Devoria Glassing Other Clinician: Referring Provider: Devoria Glassing Treating Provider/Extender: Altamese [place] in Treatment: 5 History of Present Illness HPI Description: ADMISSION 06/25/2019 This is a 79 year old woman who comes from the [facility] skilled facility to [place] today for review of a pressure ulcer on her left hip. I really do not know much about this and I am not exactly certain how long this is been present. The patient was hospitalized from 1/16 through 1/19 at [facility] with Covid pneumonia all ended she is signed out is having a pressure ulcer on the left ischial tuberosity although there is very little information on this. She comes with an attendant from the facility. 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 Past medical history includes interstitial lung disease, L1 compression fracture, deafness, COPD, Covid pneumonia as described. Socially she has a DSS Child psychotherapist we were not able to contact her before doing this review. My interaction with the patient and allowing her to read my lips I felt that the patient understood what I was saying about her wounds and the need for debridement. I therefore went ahead and treated these aggressively in the clinic. 2/10; patient came in with a necrotic wound on her left greater trochanter last week. Extensive debridement we use Santyl. The wound is cleaned up quite nicely but goes down to periosteum. Lab work I ordered last week showed a white count of 9.3 with a normal differential hemoglobin 8.9. CMP was normal other than an albumin of 3. Her sedimentation rate was 24 and C-reactive protein 19. These are not remarkably high. We  could not get an x-ray of the left greater trochanter done in the facility for some reason so I will order this to be done in the [facility] facility so I can see it online. The patient does not complain of pain 2/17; deep pressure area on the left greater trochanter. The x-ray we obtained at [facility] health showed no specific radiographic evidence of osteomyelitis. Unfortunately the patient fell and fractured her left arm she has a cast on her arm 2/24; deep pressure area on the left greater trochanter. This is down to periosteum in this area and probes superiorly. There is some tissue here. We have a wound VAC and I think it is time to apply it. We will see how the wound reacts to this. There was a slight odor to the wound bed and I did a culture today but no empiric antibiotics. The patient is in an [facility] 3/[facility]; deep pressure ulcer over the left greater trochanter. We put a wound VAC on this last week. She comes in today with a much improved wound. The wound is contracted and I think the undermining area is filled then. I could not easily detect any palpable bone. Culture from last time grew Pseudomonas and methicillin resistant staph aureus [MRSA]. This is never an easy combination. I gave her a combination of Cipro and doxycycline as she was allergic to Bactrim. Both of these will be for 7 days. 3/10; patient is completing her antibiotics. She does not appear to require any additional antibiotics. Wound orifice continues to contract there is a lot of this that is granulated however significant tunneling from  9-3 o'clock Electronic Signature(s) Signed: 07/30/2019 5:25:14 PM By: Baltazar Najjar MD Entered By: Baltazar Najjar on 07/30/2019 13:50:38 Alice Wallace ([id number]) -------------------------------------------------------------------------------- Physical Exam Details Patient Name: Alice Wallace Date of Service: 07/30/2019 1:00 PM Medical Record Number:  [id number] Patient Account Number: [id number] Date of Birth/Sex: [birth year] (79 y.o. F) Treating RN: Huel Coventry Primary Care Provider: Devoria Glassing Other Clinician: Referring Provider: Devoria Glassing Treating Provider/Extender: Maxwell Caul [place] in Treatment: 5 Constitutional Sitting or standing Blood Pressure is within target range for patient.. Pulse regular and within target range for patient.Marland Kitchen Respirations regular, non- labored and within target range.. Temperature is normal and within the target range for the patient.Marland Kitchen appears in no distress. Respiratory Respiratory effort is easy and symmetric bilaterally. Rate is normal at rest and on room air.Marland Kitchen Psychiatric No evidence of depression, anxiety, or agitation. Calm, cooperative, and communicative. Appropriate interactions and affect.. Notes Wound exam; the patient's deep punched out area over the posterior aspect of the left great trochanter continues to contract in terms of surface area. There is absolutely no exposed bone. Significant tunneling from roughly 9-3 o'clock is going to take some time to close down but overall this looks better. No current evidence of infection Electronic Signature(s) Signed: 07/30/2019 5:25:14 PM By: Baltazar Najjar MD Entered By: Baltazar Najjar on 07/30/2019 13:51:33 Alice Wallace, Alice Wallace ([id number]) -------------------------------------------------------------------------------- Physician Orders Details Patient Name: Alice Wallace Date of Service: 07/30/2019 1:00 PM Medical Record Number: [id number] Patient Account Number: [id number] Date of Birth/Sex: May 25, 1940 (79 y.o. F) Treating RN: Huel Coventry Primary Care Provider: Devoria Glassing Other Clinician: Referring Provider: Devoria Glassing Treating Provider/Extender: Altamese [place] in Treatment: 5 Verbal / Phone Orders: No Diagnosis Coding Wound Cleansing Wound #1 Left Trochanter o Clean wound with Normal Saline. o anasept Anesthetic  (add to Medication List) Wound #1 Left Trochanter o Topical Lidocaine 4% cream applied to wound bed prior to debridement (In Clinic Only). Primary Wound Dressing Wound #1 Left Trochanter o Saline moistened gauze - wet-to-dry in clinic o Silver Collagen - under NPWT Secondary Dressing Wound #1 Left Trochanter o Boardered Foam Dressing - in clinic Dressing Change Frequency Wound #1 Left Trochanter o Change Dressing Monday, Wednesday, Friday Follow-up Appointments Wound #1 Left Trochanter o Return Appointment in 1 week. Off-Loading Wound #1 Left Trochanter o Turn and reposition every 2 hours o Other: - no pressure on wounded area Additional Orders / Instructions Wound #1 Left Trochanter o Increase protein intake. o Activity as tolerated Home Health Wound #1 Left Trochanter o Continue Home Health Visits - [facility] o Home Health Nurse may visit PRN to address patientos wound care needs. o FACE TO FACE ENCOUNTER: MEDICARE and MEDICAID PATIENTS: I certify that this patient is under my care and that I had a face-to- face encounter that meets the physician face-to-face encounter requirements with this patient on this date. The encounter with the patient was in whole or in part for the following MEDICAL CONDITION: (primary reason for Home Healthcare) MEDICAL NECESSITY: I certify, that based on my findings, NURSING services are a medically necessary home health service. HOME BOUND STATUS: I certify that my clinical findings support that this patient is homebound (i.e., Due to illness or injury, pt requires aid of supportive devices such as crutches, cane, wheelchairs, walkers, the use of special transportation or the assistance of another person to leave their place of residence. There is a normal inability to leave the home and doing so requires considerable and taxing effort. Other absences are for medical reasons /  religious services and are infrequent or of  short duration when for other reasons). o If current dressing causes regression in wound condition, may D/C ordered dressing product/s and apply Normal Saline Moist Dressing daily until next [place] / Other MD appointment. [place] of regression in wound condition at [phone]. NYLAN, NAKATANI ([id number]) o Please direct any NON-WOUND related issues/requests for orders to patient's Primary Care Physician Negative Pressure Wound Therapy Wound #1 Left Trochanter o Wound VAC settings at 125/130 mmHg continuous pressure. Use BLACK/GREEN foam to wound cavity. Use WHITE foam to fill any tunnel/s and/or undermining. Change VAC dressing 3 X WEEK. Change canister as indicated when full. Nurse may titrate settings and frequency of dressing changes as clinically indicated. - Home health to place wound vac at facility. o Home Health Nurse may d/c VAC for s/s of increased infection, significant wound regression, or uncontrolled drainage. [place] at [phone]. o Apply contact layer over base of wound. - Silver Collagen under sponge at base of wound Electronic Signature(s) Signed: 07/30/2019 5:22:30 PM By: Gretta Cool, BSN, RN, CWS, Kim RN, BSN Signed: 07/30/2019 5:25:14 PM By: Linton Ham MD Entered By: Gretta Cool, BSN, RN, CWS, Kim on 07/30/2019 13:29:45 Alice Wallace ([id number]) -------------------------------------------------------------------------------- Problem List Details Patient Name: Alice Wallace Date of Service: 07/30/2019 1:00 PM Medical Record Number: [id number] Patient Account Number: [id number] Date of Birth/Sex: January 05, 1941 (79 y.o. 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 F) Treating RN: Cornell Barman Primary Care Provider: Odessa Fleming Other Clinician: Referring Provider: Odessa Fleming Treating Provider/Extender: Tito Dine in Treatment: 5 Subjective History of Present Illness (HPI) ADMISSION 06/25/2019 This is a 79 year old woman who comes from the [place] skilled facility to [place] today for review of a pressure ulcer on her left hip. I really do not know much about this and I am not exactly certain how long this is been present. The patient was hospitalized from 1/16 through 1/19 at [facility] with Covid pneumonia all ended she is signed out is having a pressure ulcer on the left ischial tuberosity although there is very little information on this. She comes with an attendant from the facility. 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 Past medical history includes interstitial lung disease, L1 compression fracture, deafness, COPD, Covid pneumonia as described. Socially she has a DSS Education officer, museum we were not able to contact her before doing this review. My interaction with the patient and allowing her to read my lips I felt that the patient understood what I was saying about her wounds and the need for debridement. I therefore went ahead and treated these aggressively in the clinic. 2/10; patient came in with a necrotic wound on her left greater trochanter last week. Extensive  debridement we use Santyl. The wound is cleaned up quite nicely but goes down to periosteum. Lab work I ordered last week showed a  white count of 9.3 with a normal differential hemoglobin 8.9. CMP was normal other than an albumin of 3. Her sedimentation rate was 24 and C-reactive protein 19. These are not remarkably high. We could not get an x-ray of the left greater trochanter done in the facility for some reason so I will order this to be done in the [facility] facility so I can see it online. The patient does not complain of pain 2/17; deep pressure area on the left greater trochanter. The x-ray we obtained at [facility] health showed no specific radiographic evidence of osteomyelitis. Unfortunately the patient fell and fractured her left arm she has a cast on her arm 2/24; deep pressure area on the left greater trochanter. This is down to periosteum in this area and probes superiorly. There is some tissue here. We have a wound VAC and I think it is time to apply it. We will see how the wound reacts to this. There was a slight odor to the wound bed and I did a culture today but no empiric antibiotics. The patient is in an [facility] living [facility] 3/3; deep pressure ulcer over the left greater trochanter. We put a wound VAC on this last week. She comes in today with a much improved wound. The wound is contracted and I think the undermining area is filled then. I could not easily detect any palpable bone. Culture from last time grew Pseudomonas and methicillin resistant staph aureus [MRSA]. This is never an easy combination. I gave her a combination of Cipro and doxycycline as she was allergic to Bactrim. Both of these will be for 7 days. 3/10; patient is completing her antibiotics. She does not appear to require any additional antibiotics. Wound orifice continues to contract there is a lot of this that is granulated however significant tunneling from 9-3  o'clock Objective Constitutional Sitting or standing Blood Pressure is within target range for patient.. Pulse regular and within target range for patient.Marland Kitchen Respirations regular, non- labored and within target range.. Temperature is normal and within the target range for the patient.Marland Kitchen appears in no distress. Vitals Time Taken: 1:12 PM, Height: 64 in, Weight: 120 lbs, BMI: 20.6, Temperature: 99.4 F, Pulse: 75 bpm, Respiratory Rate: 16 breaths/min, Blood Pressure: 140/55 mmHg. Respiratory Respiratory effort is easy and symmetric bilaterally. Rate is normal at rest and on room air.Marland Kitchen Psychiatric No evidence of depression, anxiety, or agitation. Calm, cooperative, and communicative. Appropriate interactions and affect.. General Notes: Wound exam; the patient's deep punched out area over the posterior aspect of the left great trochanter continues to contract in terms Alice Wallace, Alice Wallace ([id number]) of surface area. There is absolutely no exposed bone. Significant tunneling from roughly 9-3 o'clock is going to take some time to close down but overall this looks better. No current evidence of infection Integumentary (Hair, Skin) Wound #1 status is Open. Original cause of wound was Pressure Injury. The wound is located on the Left Trochanter. The wound measures 2.5cm length x 3cm width x 1.6cm depth; 5.89cm^2 area and 9.425cm^3 volume. There is Fat Layer (Subcutaneous Tissue) Exposed exposed. Tunneling has been noted at 11:00 with a maximum distance of 4.8cm. Undermining begins at 9:00 and ends at 3:00 with a maximum distance of 3.5cm. There is a large amount of serosanguineous drainage noted. Foul odor after cleansing was noted. The wound margin is flat and intact. There is medium (34- 66%) red granulation within the wound bed. There is a medium (34-66%) amount of necrotic tissue within  the wound bed including Adherent Slough. Assessment Active Problems ICD-10 Pressure ulcer of right hip, stage  4 Personal history of COVID-19 Plan Wound Cleansing: Wound #1 Left Trochanter: Clean wound with Normal Saline. anasept Anesthetic (add to Medication List): Wound #1 Left Trochanter: Topical Lidocaine 4% cream applied to wound bed prior to debridement (In Clinic Only). Primary Wound Dressing: Wound #1 Left Trochanter: Saline moistened gauze - wet-to-dry in clinic Silver Collagen - under NPWT Secondary Dressing: Wound #1 Left Trochanter: Boardered Foam Dressing - in clinic Dressing Change Frequency: Wound #1 Left Trochanter: Change Dressing Monday, Wednesday, Friday Follow-up Appointments: Wound #1 Left Trochanter: Return Appointment in 1 week. Off-Loading: Wound #1 Left Trochanter: Turn and reposition every 2 hours Other: - no pressure on wounded area Additional Orders / Instructions: Wound #1 Left Trochanter: Increase protein intake. Activity as tolerated Home Health: Wound #1 Left Trochanter: Continue Home Health Visits - [facility] Nurse may visit PRN to address patient s wound care needs. FACE TO FACE ENCOUNTER: MEDICARE and MEDICAID PATIENTS: I certify that this patient is under my care and that I had a face-to-face encounter that meets the physician face-to-face encounter requirements with this patient on this date. The encounter with the patient was in whole or in part for the following MEDICAL CONDITION: (primary reason for Home Healthcare) MEDICAL NECESSITY: I certify, that based on my findings, NURSING services are a medically necessary home health service. HOME BOUND STATUS: I certify that my clinical findings support that this patient is homebound (i.e., Due to illness or injury, pt requires aid of supportive devices such as crutches, cane, wheelchairs, walkers, the use of special transportation or the assistance of another person to leave their place of residence. There is a normal inability to leave the home and doing so requires considerable and  taxing effort. Other absences are for medical reasons / religious services and are infrequent or of short duration when for other reasons). If current dressing causes regression in wound condition, may D/C ordered dressing product/s and apply Normal Saline Moist Dressing daily until next Wound Healing Center / Other MD appointment. Notify Wound Healing Center of regression in wound condition at [phone]. Alice Wallace, Alice Wallace ([id number]) Please direct any NON-WOUND related issues/requests for orders to patient's Primary Care Physician Negative Pressure Wound Therapy: Wound #1 Left Trochanter: Wound VAC settings at 125/130 mmHg continuous pressure. Use BLACK/GREEN foam to wound cavity. Use WHITE foam to fill any tunnel/s and/or undermining. Change VAC dressing 3 X WEEK. Change canister as indicated when full. Nurse may titrate settings and frequency of dressing changes as clinically indicated. - Home health to place wound vac at facility. Home Health Nurse may d/c VAC for s/s of increased infection, significant wound regression, or uncontrolled drainage. Notify Wound Healing Center at [phone]. Apply contact layer over base of wound. - Silver Collagen under sponge at base of wound 1. We will continue the wound VAC has each she seems to be tolerating it well and resulting in decent improvement 2. She can complete prescribed antibiotics as of today 3. No additional cultures were felt to be necessary Electronic Signature(s) Signed: 07/30/2019 5:25:14 PM By: Baltazar Najjar MD Entered By: Baltazar Najjar on 07/30/2019 13:52:36 Alice Wallace, Alice Wallace ([id number]) -------------------------------------------------------------------------------- SuperBill Details Patient Name: Alice Wallace Date of Service: 07/30/2019 Medical Record Number: [id number] Patient Account Number: [id number] Date of Birth/Sex: [birth year] (79 y.o. 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

## 2019-07-31 NOTE — Progress Notes (Signed)
KENSLEIGH, GATES ([id number]) Visit Report for 07/30/2019 Arrival Information Details Patient Name: SONAKSHI, ROLLAND Date of Service: 07/30/2019 1:00 PM Medical Record Number: [id number] Patient Account Number: [id number] Date of Birth/Sex: 1940/11/30 (79 y.o. F) Treating RN: Rodell Perna Primary Care Jahquan Klugh: Devoria Glassing Other Clinician: Referring Teckla Christiansen: Devoria Glassing Treating Jayanth Szczesniak/Extender: Altamese [place] in Treatment: 5 Visit Information History Since Last Visit Added or deleted any medications: No Patient Arrived: Walker Any new allergies or adverse reactions: No Arrival Time: 13:12 Had a fall or experienced change in No Accompanied By: caregiver activities of daily living that may affect Transfer Assistance: None risk of falls: Signs or symptoms of abuse/neglect since last visito No Hospitalized since last visit: No Has Dressing in Place as Prescribed: Yes Pain Present Now: No Electronic Signature(s) Signed: 07/31/2019 9:13:28 AM By: Rodell Perna Entered By: Rodell Perna on 07/30/2019 13:12:29 Kensinger, Salle ([id number]) -------------------------------------------------------------------------------- Clinic Level of Care Assessment Details Patient Name: [name] Date of Service: 07/30/2019 1:00 PM Medical Record Number: [id number] Patient Account Number: [id number] Date of Birth/Sex: 04-09-1941 (78 y.o. F) Treating RN: Huel Coventry Primary Care Jessalyn Hinojosa: Devoria Glassing Other Clinician: Referring Saydee Zolman: Devoria Glassing Treating Areta Terwilliger/Extender: Altamese [place] in Treatment: 5 Clinic Level of Care Assessment Items TOOL 4 Quantity Score []  - Use when only an EandM is performed on FOLLOW-UP visit 0 ASSESSMENTS - Nursing Assessment / Reassessment []  - Reassessment of Co-morbidities (includes updates in patient status) 0 X- 1 5 Reassessment of Adherence to Treatment Plan ASSESSMENTS - Wound and Skin Assessment / Reassessment X - Simple Wound  Assessment / Reassessment - one wound 1 5 []  - 0 Complex Wound Assessment / Reassessment - multiple wounds []  - 0 Dermatologic / Skin Assessment (not related to wound area) ASSESSMENTS - Focused Assessment []  - Circumferential Edema Measurements - multi extremities 0 []  - 0 Nutritional Assessment / Counseling / Intervention []  - 0 Lower Extremity Assessment (monofilament, tuning fork, pulses) []  - 0 Peripheral Arterial Disease Assessment (using hand held doppler) ASSESSMENTS - Ostomy and/or Continence Assessment and Care []  - Incontinence Assessment and Management 0 []  - 0 Ostomy Care Assessment and Management (repouching, etc.) PROCESS - Coordination of Care X - Simple Patient / Family Education for ongoing care 1 15 []  - 0 Complex (extensive) Patient / Family Education for ongoing care X- 1 10 Staff obtains Consents, Records, Test Results / Process Orders []  - 0 Staff telephones HHA, Nursing Homes / Clarify orders / etc []  - 0 Routine Transfer to another Facility (non-emergent condition) []  - 0 Routine Hospital Admission (non-emergent condition) []  - 0 New Admissions / / Ordering NPWT, Apligraf, etc. []  - 0 Emergency Hospital Admission (emergent condition) X- 1 10 Simple Discharge Coordination []  - 0 Complex (extensive) Discharge Coordination PROCESS - Special Needs []  - Pediatric / Minor Patient Management 0 []  - 0 Isolation Patient Management []  - 0 Hearing / Language / Visual special needs []  - 0 Assessment of Community assistance (transportation, D/C planning, etc.) [place], Verna ( ) []  - 0 Additional assistance / Altered mentation []  - 0 Support Surface(s) Assessment (bed, cushion, seat, etc.) INTERVENTIONS - Wound Cleansing / Measurement X - Simple Wound Cleansing - one wound 1 5 []  - 0 Complex Wound Cleansing - multiple wounds X- 1 5 Wound Imaging (photographs - any number of wounds) []  - 0 Wound Tracing (instead of  photographs) X- 1 5 Simple Wound Measurement - one wound []  - 0 Complex Wound Measurement - multiple wounds INTERVENTIONS -  Wound Dressings []  - Small Wound Dressing one or multiple wounds 0 X- 1 15 Medium Wound Dressing one or multiple wounds []  - 0 Large Wound Dressing one or multiple wounds []  - 0 Application of Medications - topical []  - 0 Application of Medications - injection INTERVENTIONS - Miscellaneous []  - External ear exam 0 []  - 0 Specimen Collection (cultures, biopsies, blood, body fluids, etc.) []  - 0 Specimen(s) / Culture(s) sent or taken to Lab for analysis []  - 0 Patient Transfer (multiple staff / Harrel Lemon Lift / Similar devices) []  - 0 Simple Staple / Suture removal (25 or less) []  - 0 Complex Staple / Suture removal (26 or more) []  - 0 Hypo / Hyperglycemic Management (close monitor of Blood Glucose) []  - 0 Ankle / Brachial Index (ABI) - do not check if billed separately X- 1 5 Vital Signs Has the patient been seen at the hospital within the last three years: Yes Total Score: 80 Level Of Care: New/Established - Level 3 Electronic Signature(s) Signed: 07/30/2019 5:22:30 PM By: Gretta Cool, BSN, RN, CWS, Kim RN, BSN Entered By: Gretta Cool, BSN, RN, CWS, Kim on 07/30/2019 13:30:26 [name] ([id number]) -------------------------------------------------------------------------------- Encounter Discharge Information Details Patient Name: [name] Date of Service: 07/30/2019 1:00 PM Medical Record Number: [id number] Patient Account Number: [id number] Date of Birth/Sex: Nov 06, 1940 (78 y.o. F) Treating RN: Cornell Barman Primary Care Dinah Lupa: Odessa Fleming Other Clinician: Referring Ameya Vowell: Odessa Fleming Treating Sharlett Lienemann/Extender: Tito Dine in Treatment: 5 Encounter Discharge Information Items Discharge Condition: Stable Ambulatory Status: Ambulatory Discharge Destination: Home Transportation: Private Auto Accompanied By: caregiver Schedule  Follow-up Appointment: Yes Clinical Summary of Care: Electronic Signature(s) Signed: 07/30/2019 5:22:30 PM By: Gretta Cool, BSN, RN, CWS, Kim RN, BSN Entered By: Gretta Cool, BSN, RN, CWS, Kim on 07/30/2019 13:31:24 [name] ([id number]) -------------------------------------------------------------------------------- Lower Extremity Assessment Details Patient Name: [name] Date of Service: 07/30/2019 1:00 PM Medical Record Number: [id number] Patient Account Number: [id number] Date of Birth/Sex: [birth year] (78 y.o. F) Treating RN: Army Melia Primary Care Hadasa Gasner: Odessa Fleming Other Clinician: Referring Tyrease Vandeberg: Odessa Fleming Treating Daniele Dillow/Extender: Ricard Dillon Weeks in Treatment: 5 Electronic Signature(s) Signed: 07/31/2019 9:13:28 AM By: Army Melia Entered By: Army Melia on 07/30/2019 13:21:26 Knock, Shaylon ([id number]) -------------------------------------------------------------------------------- Multi Wound Chart Details Patient Name: [name] Date of Service: 07/30/2019 1:00 PM Medical Record Number: [id number] Patient Account Number: [id number] Date of Birth/Sex: [birth year] (79 y.o. F) Treating RN: Cornell Barman Primary Care Elenna Spratling: Odessa Fleming Other Clinician: Referring Leonardo Plaia: Odessa Fleming Treating Cavan Bearden/Extender: Tito Dine in Treatment: 5 Vital Signs Height(in): 64 Pulse(bpm): 75 Weight(lbs): 120 Blood Pressure(mmHg): 140/55 Body Mass Index(BMI): 21 Temperature(F): 99.4 Respiratory Rate(breaths/min): 16 Photos: [N/A:N/A] Wound Location: Left Trochanter N/A N/A Wounding Event: Pressure Injury N/A N/A Primary Etiology: Pressure Ulcer N/A N/A Comorbid History: Anemia, Chronic Obstructive N/A N/A Pulmonary Disease (COPD), Arrhythmia, Hypertension Date Acquired: 04/22/2019 N/A N/A Weeks of Treatment: 5 N/A N/A Wound Status: Open N/A N/A Measurements L x W x D (cm) 2.5x3x1.6 N/A N/A Area (cm) : 5.89 N/A N/A Volume  (cm) : 9.425 N/A N/A % Reduction in Area: 51.90% N/A N/A % Reduction in Volume: 45.10% N/A N/A Position 1 (o'clock): 11 Maximum Distance 1 (cm): 4.8 Starting Position 1 (o'clock): 9 Ending Position 1 (o'clock): 3 Maximum Distance 1 (cm): 3.5 Tunneling: Yes N/A N/A Undermining: Yes N/A N/A Classification: Category/Stage III N/A N/A Exudate Amount: Large N/A N/A Exudate Type: Serosanguineous N/A N/A Exudate Color: red, brown N/A N/A Foul Odor After Cleansing: Yes N/A N/A  Odor Anticipated Due to Product No N/A N/A Use: Wound Margin: Flat and Intact N/A N/A Granulation Amount: Medium (34-66%) N/A N/A Granulation Quality: Red N/A N/A Necrotic Amount: Medium (34-66%) N/A N/A Exposed Structures: Fat Layer (Subcutaneous Tissue) N/A N/A Exposed: Yes Fascia: No Tendon: No Muscle: No Starks, Luevenia ([id number]) Joint: No Bone: No Epithelialization: None N/A N/A Treatment Notes Wound #1 (Left Trochanter) Notes wet-to-dry secured with bordered foam dressing Electronic Signature(s) Signed: 07/30/2019 5:25:14 PM By: Baltazar Najjar MD Entered By: Baltazar Najjar on 07/30/2019 13:49:19 Captain, Yashira ([id number]) -------------------------------------------------------------------------------- Multi-Disciplinary Care Plan Details Patient Name: [name] Date of Service: 07/30/2019 1:00 PM Medical Record Number: [id number] Patient Account Number: [id number] Date of Birth/Sex: 1941-04-07 (79 y.o. F) Treating RN: Huel Coventry Primary Care Nery Frappier: Devoria Glassing Other Clinician: Referring Kyley Solow: Devoria Glassing Treating Zorana Brockwell/Extender: Altamese [place] in Treatment: 5 Active Inactive Medication Nursing Diagnoses: Knowledge deficit related to medication safety: actual or potential Goals: Patient/caregiver will demonstrate understanding of all current medications Date Initiated: 07/02/2019 Target Resolution Date: 07/31/2019 Goal Status: Active Interventions: Assess for  medication contraindications each visit where new medications are prescribed Notes: Orientation to the Wound Care Program Nursing Diagnoses: Knowledge deficit related to the wound healing center program Goals: Patient/caregiver will verbalize understanding of the Wound Healing Center Program Date Initiated: 07/02/2019 Target Resolution Date: 07/23/2019 Goal Status: Active Interventions: Provide education on orientation to the wound center Notes: Pressure Nursing Diagnoses: Knowledge deficit related to management of pressures ulcers Goals: Patient will remain free of pressure ulcers Date Initiated: 07/02/2019 Target Resolution Date: 07/16/2019 Goal Status: Active Interventions: Provide education on pressure ulcers Notes: Wound/Skin Impairment Nursing Diagnoses: Impaired tissue integrity Knowledge deficit related to smoking impact on wound healing Albers, Beverely ([id number]) Goals: Patient/caregiver will verbalize understanding of skin care regimen Date Initiated: 07/02/2019 Target Resolution Date: 07/23/2019 Goal Status: Active Ulcer/skin breakdown will have a volume reduction of 30% by week 4 Date Initiated: 07/02/2019 Target Resolution Date: 07/30/2019 Goal Status: Active Interventions: Assess ulceration(s) every visit Provide education on ulcer and skin care Notes: Electronic Signature(s) Signed: 07/30/2019 5:22:30 PM By: Elliot Gurney, BSN, RN, CWS, Kim RN, BSN Entered By: Elliot Gurney, BSN, RN, CWS, Kim on 07/30/2019 13:28:05 [name] ([id number]) -------------------------------------------------------------------------------- Pain Assessment Details Patient Name: [name] Date of Service: 07/30/2019 1:00 PM Medical Record Number: [id number] Patient Account Number: [id number] Date of Birth/Sex: 09-03-1940 (78 y.o. F) Treating RN: Rodell Perna Primary Care Kynslei Art: Devoria Glassing Other Clinician: Referring Andreina Outten: Devoria Glassing Treating Ciarah Peace/Extender: Altamese [place] in Treatment: 5 Active Problems Location of Pain Severity and Description of Pain Patient Has Paino No Site Locations Pain Management and Medication Current Pain Management: Electronic Signature(s) Signed: 07/31/2019 9:13:28 AM By: Rodell Perna Entered By: Rodell Perna on 07/30/2019 13:18:29 Kruser, Khamiya ([id number]) -------------------------------------------------------------------------------- Patient/Caregiver Education Details Patient Name: [name] Date of Service: 07/30/2019 1:00 PM Medical Record Number: [id number] Patient Account Number: [id number] Date of Birth/Gender: 1940-11-11 (79 y.o. F) Treating RN: Huel Coventry Primary Care Physician: Devoria Glassing Other Clinician: Referring Physician: Devoria Glassing Treating Physician/Extender: Altamese  in Treatment: 5 Education Assessment Education Provided To: Patient Education Topics Provided Wound/Skin Impairment: Handouts: Caring for Your Ulcer Methods: Demonstration, Explain/Verbal Responses: State content correctly Electronic Signature(s) Signed: 07/30/2019 5:22:30 PM By: Elliot Gurney, BSN, RN, CWS, Kim RN, BSN Entered By: Elliot Gurney, BSN, RN, CWS, Kim on 07/30/2019 13:30:56 [name] ([id number]) -------------------------------------------------------------------------------- Wound Assessment Details Patient Name: [name] Date of Service: 07/30/2019 1:00 PM Medical Record Number: [id number] Patient Account Number: [id number] Date  of Birth/Sex: January 30, 1941 (79 y.o. F) Treating RN: Rodell Perna Primary Care Rosalea Withrow: Devoria Glassing Other Clinician: Referring Khia Dieterich: Devoria Glassing Treating Mariem Skolnick/Extender: Altamese [place] in Treatment: 5 Wound Status Wound Number: 1 Primary Pressure Ulcer Etiology: Wound Location: Left Trochanter Wound Status: Open Wounding Event: Pressure Injury Comorbid Anemia, Chronic Obstructive Pulmonary Disease (COPD), Date Acquired: 04/22/2019 History:  Arrhythmia, Hypertension Weeks Of Treatment: 5 Clustered Wound: No Photos Wound Measurements Length: (cm) 2.5 % R Width: (cm) 3 % R Depth: (cm) 1.6 Epi Area: (cm) 5.89 Tu Volume: (cm) 9.425 eduction in Area: 51.9% eduction in Volume: 45.1% thelialization: None nneling: Yes Position (o'clock): 11 Maximum Distance: (cm) 4.8 Undermining: Yes Starting Position (o'clock): 9 Ending Position (o'clock): 3 Maximum Distance: (cm) 3.5 Wound Description Classification: Category/Stage III Fo Wound Margin: Flat and Intact Du Exudate Amount: Large Sl Exudate Type: Serosanguineous Exudate Color: red, brown ul Odor After Cleansing: Yes e to Product Use: No ough/Fibrino Yes Wound Bed Granulation Amount: Medium (34-66%) Exposed Structure Granulation Quality: Red Fascia Exposed: No Necrotic Amount: Medium (34-66%) Fat Layer (Subcutaneous Tissue) Exposed: Yes Necrotic Quality: Adherent Slough Tendon Exposed: No Muscle Exposed: No Joint Exposed: No Bone Exposed: No Treatment Notes Isham, Kathleena ([id number]) Wound #1 (Left Trochanter) Notes wet-to-dry secured with bordered foam dressing Electronic Signature(s) Signed: 07/31/2019 9:13:28 AM By: Rodell Perna Entered By: Rodell Perna on 07/30/2019 13:20:36 Rodger, Rainna ([id number]) -------------------------------------------------------------------------------- Vitals Details Patient Name: [name] Date of Service: 07/30/2019 1:00 PM Medical Record Number: [id number] Patient Account Number: [id number] Date of Birth/Sex: Aug 13, 1940 (79 y.o. F) Treating RN: Rodell Perna Primary Care Ignace Mandigo: Devoria Glassing Other Clinician: Referring Kimberly Nieland: Devoria Glassing Treating Cerra Eisenhower/Extender: Altamese [place] in Treatment: 5 Vital Signs Time Taken: 13:12 Temperature (F): 99.4 Height (in): 64 Pulse (bpm): 75 Weight (lbs): 120 Respiratory Rate (breaths/min): 16 Body Mass Index (BMI): 20.6 Blood Pressure (mmHg):  140/55 Reference Range: 80 - 120 mg / dl Electronic Signature(s) Signed: 07/31/2019 9:13:28 AM By: Rodell Perna Entered By: Rodell Perna on 07/30/2019 13:12:59

## 2019-08-06 ENCOUNTER — Encounter: Payer: Medicare Other | Admitting: Internal Medicine

## 2019-08-13 ENCOUNTER — Other Ambulatory Visit: Payer: Self-pay

## 2019-08-13 ENCOUNTER — Encounter: Payer: Medicare Other | Admitting: Internal Medicine

## 2019-08-13 DIAGNOSIS — L89214 Pressure ulcer of right hip, stage 4: Secondary | ICD-10-CM | POA: Diagnosis not present

## 2019-08-27 ENCOUNTER — Encounter: Payer: Medicare Other | Attending: Internal Medicine | Admitting: Internal Medicine

## 2019-08-27 ENCOUNTER — Other Ambulatory Visit: Payer: Self-pay

## 2019-08-27 DIAGNOSIS — Z881 Allergy status to other antibiotic agents status: Secondary | ICD-10-CM | POA: Insufficient documentation

## 2019-08-27 DIAGNOSIS — Z8616 Personal history of COVID-19: Secondary | ICD-10-CM | POA: Insufficient documentation

## 2019-08-27 DIAGNOSIS — L89214 Pressure ulcer of right hip, stage 4: Secondary | ICD-10-CM | POA: Diagnosis present

## 2019-08-27 DIAGNOSIS — I10 Essential (primary) hypertension: Secondary | ICD-10-CM | POA: Insufficient documentation

## 2019-08-27 DIAGNOSIS — H9190 Unspecified hearing loss, unspecified ear: Secondary | ICD-10-CM | POA: Insufficient documentation

## 2019-08-27 DIAGNOSIS — Z6821 Body mass index (BMI) 21.0-21.9, adult: Secondary | ICD-10-CM | POA: Insufficient documentation

## 2019-08-27 DIAGNOSIS — J449 Chronic obstructive pulmonary disease, unspecified: Secondary | ICD-10-CM | POA: Diagnosis not present

## 2019-08-29 NOTE — Progress Notes (Signed)
ALEYNA, CUEVA ([id number]) Visit Report for 08/27/2019 Arrival Information Details Patient Name: Alice Wallace, Alice Wallace Date of Service: 08/27/2019 2:00 PM Medical Record Number: [id number] Patient Account Number: [id number] Date of Birth/Sex: October 25, 1940 (79 y.o. F) Treating RN: Huel Coventry Primary Care Emelynn Rance: Devoria Glassing Other Clinician: Referring Dareld Mcauliffe: Devoria Glassing Treating Jakira Mcfadden/Extender: Altamese [place] in Treatment: 9 Visit Information History Since Last Visit Added or deleted any medications: No Patient Arrived: Walker Any new allergies or adverse reactions: No Arrival Time: 13:49 Had a fall or experienced change in No Accompanied By: caregiver activities of daily living that may affect Transfer Assistance: None risk of falls: Patient Identification Verified: Yes Signs or symptoms of abuse/neglect since last visito No Secondary Verification Process Completed: Yes Hospitalized since last visit: No Implantable device outside of the clinic excluding No cellular tissue based products placed in the center since last visit: Has Dressing in Place as Prescribed: Yes Pain Present Now: No Electronic Signature(s) Signed: 08/27/2019 3:46:59 PM By: Dayton Martes RCP, RRT, CHT Entered By: Dayton Martes on 08/27/2019 13:55:33 Lindamood, Alawna ([id number]) -------------------------------------------------------------------------------- Clinic Level of Care Assessment Details Patient Name: Alice Wallace Date of Service: 08/27/2019 2:00 PM Medical Record Number: [id number] Patient Account Number: [id number] Date of Birth/Sex: April 02, 1941 (79 y.o. F) Treating RN: Huel Coventry Primary Care Naziah Portee: Devoria Glassing Other Clinician: Referring Morrell Fluke: Devoria Glassing Treating Raliyah Montella/Extender: Altamese [place] in Treatment: 9 Clinic Level of Care Assessment Items TOOL 4 Quantity Score []  - Use when only an EandM is performed on FOLLOW-UP visit  0 ASSESSMENTS - Nursing Assessment / Reassessment X - Reassessment of Co-morbidities (includes updates in patient status) 1 10 X- 1 5 Reassessment of Adherence to Treatment Plan ASSESSMENTS - Wound and Skin Assessment / Reassessment X - Simple Wound Assessment / Reassessment - one wound 1 5 []  - 0 Complex Wound Assessment / Reassessment - multiple wounds []  - 0 Dermatologic / Skin Assessment (not related to wound area) ASSESSMENTS - Focused Assessment []  - Circumferential Edema Measurements - multi extremities 0 []  - 0 Nutritional Assessment / Counseling / Intervention []  - 0 Lower Extremity Assessment (monofilament, tuning fork, pulses) []  - 0 Peripheral Arterial Disease Assessment (using hand held doppler) ASSESSMENTS - Ostomy and/or Continence Assessment and Care []  - Incontinence Assessment and Management 0 []  - 0 Ostomy Care Assessment and Management (repouching, etc.) PROCESS - Coordination of Care X - Simple Patient / Family Education for ongoing care 1 15 []  - 0 Complex (extensive) Patient / Family Education for ongoing care X- 1 10 Staff obtains , Records, Test Results / Process Orders []  - 0 Staff telephones HHA, Nursing Homes / Clarify orders / etc []  - 0 Routine Transfer to another Facility (non-emergent condition) []  - 0 Routine Hospital Admission (non-emergent condition) []  - 0 New Admissions / / Ordering NPWT, Apligraf, etc. []  - 0 Emergency Hospital Admission (emergent condition) X- 1 10 Simple Discharge Coordination []  - 0 Complex (extensive) Discharge Coordination PROCESS - Special Needs []  - Pediatric / Minor Patient Management 0 []  - 0 Isolation Patient Management []  - 0 Hearing / Language / Visual special needs []  - 0 Assessment of Community assistance (transportation, D/C planning, etc.) Soliday, Giavonna ( ) []  - 0 Additional assistance / Altered mentation []  - 0 Support Surface(s) Assessment (bed,  cushion, seat, etc.) INTERVENTIONS - Wound Cleansing / Measurement X - Simple Wound Cleansing - one wound 1 5 []  - 0 Complex Wound Cleansing - multiple wounds X- 1 5 Wound Imaging (  photographs - any number of wounds) []  - 0 Wound Tracing (instead of photographs) X- 1 5 Simple Wound Measurement - one wound []  - 0 Complex Wound Measurement - multiple wounds INTERVENTIONS - Wound Dressings []  - Small Wound Dressing one or multiple wounds 0 X- 1 15 Medium Wound Dressing one or multiple wounds []  - 0 Large Wound Dressing one or multiple wounds []  - 0 Application of Medications - topical []  - 0 Application of Medications - injection INTERVENTIONS - Miscellaneous []  - External ear exam 0 []  - 0 Specimen Collection (cultures, biopsies, blood, body fluids, etc.) []  - 0 Specimen(s) / Culture(s) sent or taken to Lab for analysis []  - 0 Patient Transfer (multiple staff / / Similar devices) []  - 0 Simple Staple / Suture removal (25 or less) []  - 0 Complex Staple / Suture removal (26 or more) []  - 0 Hypo / Hyperglycemic Management (close monitor of Blood Glucose) []  - 0 Ankle / Brachial Index (ABI) - do not check if billed separately X- 1 5 Vital Signs Has the patient been seen at the hospital within the last three years: Yes Total Score: 90 Level Of Care: New/Established - Level 3 Electronic Signature(s) Signed: 08/29/2019 10:29:18 AM By: , BSN, RN, CWS, Kim RN, BSN Entered By: , BSN, RN, CWS, Kim on 08/27/2019 14:27:09 ( ) -------------------------------------------------------------------------------- Encounter Discharge Information Details Patient Name: Date of Service: 08/27/2019 2:00 PM Medical Record Number: Patient Account Number: Alice Wallace, adult Date of Birth/Sex: [birth year] (78 y.o. F) Treating RN: Primary Care Charika Mikelson: Other Clinician: Referring Leonetta Mcgivern: 10/29/2019 Treating Selina Tapper/Extender: Elliot Gurney in Treatment: 9 Encounter Discharge Information Items Discharge Condition: Stable Ambulatory Status: Ambulatory Discharge Destination: Home Transportation: Private Auto Accompanied By: self Schedule Follow-up Appointment: Yes Clinical Summary of Care: Electronic Signature(s) Signed: 08/29/2019 10:29:18 AM By: 10/27/2019, BSN, RN, CWS, Kim RN, BSN Entered By: Alice Wallace, BSN, RN, CWS, Kim on 08/27/2019 14:28:23 Alice Wallace (10/27/2019) -------------------------------------------------------------------------------- Lower Extremity Assessment Details Patient Name: [id number] Date of Service: 08/27/2019 2:00 PM Medical Record Number: 12/26/1940 Patient Account Number: [id number] Date of Birth/Sex: [birth year] (78 y.o. F) Treating RN: Devoria Glassing Primary Care Santanna Whitford: Altamese [place] Other Clinician: Referring Kimbra Marcelino: 10/29/2019 Treating Oddie Kuhlmann/Extender: Elliot Gurney Weeks in Treatment: 9 Electronic Signature(s) Signed: 08/27/2019 4:17:41 PM By: 10/27/2019 Entered By: Alice Wallace on 08/27/2019 14:04:24 Bergquist, Sheena (Alice Wallace) -------------------------------------------------------------------------------- Multi Wound Chart Details Patient Name: 10/27/2019 Date of Service: 08/27/2019 2:00 PM Medical Record Number: [id number] Patient Account Number: 12/26/1940 Date of Birth/Sex: 04-28-1941 (79 y.o. F) Treating RN: Devoria Glassing Primary Care Gillian Meeuwsen: Devoria Glassing Other Clinician: Referring Oma Marzan: Maxwell Caul Treating Leonette Tischer/Extender: 10/27/2019 in Treatment: 9 Vital Signs Height(in): 64 Pulse(bpm): 90 Weight(lbs): 120 Blood Pressure(mmHg): 126/64 Body Mass Index(BMI): 21 Temperature(F): 99.1 Respiratory Rate(breaths/min): 18 Photos: [N/A:N/A] Wound Location: Left Trochanter N/A N/A Wounding Event: Pressure Injury N/A N/A Primary Etiology: Pressure Ulcer N/A N/A Comorbid  History: Anemia, Chronic Obstructive N/A N/A Pulmonary Disease (COPD), Arrhythmia, Hypertension Date Acquired: 04/22/2019 N/A N/A Weeks of Treatment: 9 N/A N/A Wound Status: Open N/A N/A Measurements L x W x D (cm) 1.7x3x1.3 N/A N/A Area (cm) : 4.006 N/A N/A Volume (cm) : 5.207 N/A N/A % Reduction in Area: 67.30% N/A N/A % Reduction in Volume: 69.60% N/A N/A Position 1 (o'clock): 10 Maximum Distance 1 (cm): 3 Tunneling: Yes N/A N/A Classification: Category/Stage III N/A N/A Exudate Amount: Medium N/A N/A Exudate Type: Serous  N/A N/A Exudate Color: amber N/A N/A Foul Odor After Cleansing: Yes N/A N/A Odor Anticipated Due to Product No N/A N/A Use: Wound Margin: Flat and Intact N/A N/A Granulation Amount: Large (67-100%) N/A N/A Granulation Quality: Red N/A N/A Necrotic Amount: Small (1-33%) N/A N/A Exposed Structures: Fat Layer (Subcutaneous Tissue) N/A N/A Exposed: Yes Fascia: No Tendon: No Muscle: No Joint: No Bone: No Epithelialization: None N/A N/A Treatment Notes Baquera, Rhen ([id number]) Wound #1 (Left Trochanter) Notes [place] secured with bordered foam dressing Electronic Signature(s) Signed: 08/27/2019 4:54:03 PM By: Linton Ham MD Entered By: Linton Ham on 08/27/2019 14:38:39 Torr, Alec ([id number]) -------------------------------------------------------------------------------- Multi-Disciplinary Care Plan Details Patient Name: [name] Date of Service: 08/27/2019 2:00 PM Medical Record Number: [id number] Patient Account Number: [id number] Date of Birth/Sex: Nov 23, 1940 (79 y.o. F) Treating RN: Cornell Barman Primary Care Marris Frontera: Odessa Fleming Other Clinician: Referring Lashara Urey: Odessa Fleming Treating Debbie Yearick/Extender: Tito Dine in Treatment: 9 Active Inactive Medication Nursing Diagnoses: Knowledge deficit related to medication safety: actual or potential Goals: Patient/caregiver will demonstrate understanding of all  current medications Date Initiated: 07/02/2019 Target Resolution Date: 07/31/2019 Goal Status: Active Interventions: Assess for medication contraindications each visit where new medications are prescribed Notes: Orientation to the Wound Care Program Nursing Diagnoses: Knowledge deficit related to the wound healing center program Goals: Patient/caregiver will verbalize understanding of the [place] Date Initiated: 07/02/2019 Target Resolution Date: 07/23/2019 Goal Status: Active Interventions: Provide education on orientation to the wound center Notes: Pressure Nursing Diagnoses: Knowledge deficit related to management of pressures ulcers Goals: Patient will remain free of pressure ulcers Date Initiated: 07/02/2019 Target Resolution Date: 07/16/2019 Goal Status: Active Interventions: Provide education on pressure ulcers Notes: Wound/Skin Impairment Nursing Diagnoses: Impaired tissue integrity Knowledge deficit related to smoking impact on wound healing Wernli, Aaniya ([id number]) Goals: Patient/caregiver will verbalize understanding of skin care regimen Date Initiated: 07/02/2019 Target Resolution Date: 07/23/2019 Goal Status: Active Ulcer/skin breakdown will have a volume reduction of 30% by week 4 Date Initiated: 07/02/2019 Target Resolution Date: 07/30/2019 Goal Status: Active Interventions: Assess ulceration(s) every visit Provide education on ulcer and skin care Notes: Electronic Signature(s) Signed: 08/29/2019 10:29:18 AM By: Gretta Cool, BSN, RN, CWS, Kim RN, BSN Entered By: Gretta Cool, BSN, RN, CWS, Kim on 08/27/2019 14:22:57 Holstein, Hinda Lenis ([id number]) -------------------------------------------------------------------------------- Pain Assessment Details Patient Name: [name] Date of Service: 08/27/2019 2:00 PM Medical Record Number: [id number] Patient Account Number: [id number] Date of Birth/Sex: 1941-03-15 (78 y.o. F) Treating RN: Montey Hora Primary  Care Pacey Altizer: Odessa Fleming Other Clinician: Referring Shaquavia Whisonant: Odessa Fleming Treating Quamir Willemsen/Extender: Tito Dine in Treatment: 9 Active Problems Location of Pain Severity and Description of Pain Patient Has Paino No Site Locations Pain Management and Medication Current Pain Management: Electronic Signature(s) Signed: 08/27/2019 4:17:41 PM By: Montey Hora Entered By: Montey Hora on 08/27/2019 14:04:34 Sax, Milessa ([id number]) -------------------------------------------------------------------------------- Patient/Caregiver Education Details Patient Name: [name] Date of Service: 08/27/2019 2:00 PM Medical Record Number: [id number] Patient Account Number: [id number] Date of Birth/Gender: [birth year] (79 y.o. F) Treating RN: Cornell Barman Primary Care Physician: Odessa Fleming Other Clinician: Referring Physician: Odessa Fleming Treating Physician/Extender: Tito Dine in Treatment: 9 Education Assessment Education Provided To: Patient Education Topics Provided Wound/Skin Impairment: Handouts: Caring for Your Ulcer Methods: Demonstration, Explain/Verbal Responses: State content correctly Electronic Signature(s) Signed: 08/29/2019 10:29:18 AM By: Gretta Cool, BSN, RN, CWS, Kim RN, BSN Entered By: Gretta Cool, BSN, RN, CWS, Kim on 08/27/2019 14:27:31 Stapleton, Hinda Lenis ([id number]) -------------------------------------------------------------------------------- Wound Assessment Details Patient Name: [name] Date  of Service: 08/27/2019 2:00 PM Medical Record Number: [id number] Patient Account Number: [id number] Date of Birth/Sex: [birth year] (78 y.o. F) Treating RN: Curtis Sites Primary Care Katniss Weedman: Devoria Glassing Other Clinician: Referring Joshwa Hemric: Devoria Glassing Treating Shona Pardo/Extender: Altamese [place] in Treatment: 9 Wound Status Wound Number: 1 Primary Pressure Ulcer Etiology: Wound Location: Left Trochanter Wound Status:  Open Wounding Event: Pressure Injury Comorbid Anemia, Chronic Obstructive Pulmonary Disease (COPD), Date Acquired: 04/22/2019 History: Arrhythmia, Hypertension Weeks Of Treatment: 9 Clustered Wound: No Photos Wound Measurements Length: (cm) 1.7 % Width: (cm) 3 % Depth: (cm) 1.3 Ep Area: (cm) 4.006 T Volume: (cm) 5.207 Reduction in Area: 67.3% Reduction in Volume: 69.6% ithelialization: None unneling: Yes Position (o'clock): 10 Maximum Distance: (cm) 3 Undermining: No Wound Description Classification: Category/Stage III F Wound Margin: Flat and Intact D Exudate Amount: Medium S Exudate Type: Serous Exudate Color: amber oul Odor After Cleansing: Yes ue to Product Use: No lough/Fibrino Yes Wound Bed Granulation Amount: Large (67-100%) Exposed Structure Granulation Quality: Red Fascia Exposed: No Necrotic Amount: Small (1-33%) Fat Layer (Subcutaneous Tissue) Exposed: Yes Necrotic Quality: Adherent Slough Tendon Exposed: No Muscle Exposed: No Joint Exposed: No Bone Exposed: No Treatment Notes Wound #1 (Left Trochanter) Notes [place] secured with bordered foam dressing Dolby, Wenonah ([id number]) Electronic Signature(s) Signed: 08/27/2019 4:17:41 PM By: Curtis Sites Entered By: Curtis Sites on 08/27/2019 14:04:12 Pree, Kymorah ([id number]) -------------------------------------------------------------------------------- Vitals Details Patient Name: Alice Wallace Date of Service: 08/27/2019 2:00 PM Medical Record Number: [id number] Patient Account Number: [id number] Date of Birth/Sex: 20-Oct-1940 (79 y.o. F) Treating RN: Huel Coventry Primary Care Rutledge Selsor: Devoria Glassing Other Clinician: Referring Xareni Kelch: Devoria Glassing Treating Kori Colin/Extender: Altamese [place] in Treatment: 9 Vital Signs Time Taken: 13:55 Temperature (F): 99.1 Height (in): 64 Pulse (bpm): 90 Weight (lbs): 120 Respiratory Rate (breaths/min): 18 Body Mass Index (BMI): 20.6 Blood  Pressure (mmHg): 126/64 Reference Range: 80 - 120 mg / dl Electronic Signature(s) Signed: 08/27/2019 3:46:59 PM By: Dayton Martes RCP, RRT, CHT Entered By: Dayton Martes on 08/27/2019 13:57:07

## 2019-08-29 NOTE — Progress Notes (Signed)
ANNETTE, BERTELSON ([id number]) Visit Report for 08/27/2019 HPI Details Patient Name: Alice Wallace, Alice Wallace Date of Service: 08/27/2019 2:00 PM Medical Record Number: [id number] Patient Account Number: [id number] Date of Birth/Sex: 23-Nov-1940 (79 y.o. F) Treating RN: Huel Coventry Primary Care Provider: Devoria Glassing Other Clinician: Referring Provider: Devoria Glassing Treating Provider/Extender: Altamese [place] in Treatment: 9 History of Present Illness HPI Description: ADMISSION 06/25/2019 This is a 79 year old woman who comes from the [facility] to [place] today for review of a pressure ulcer on her left hip. I really do not know much about this and I am not exactly certain how long this is been present. The patient was hospitalized from 1/16 through 1/19 at [facility] with Covid pneumonia all ended she is signed out is having a pressure ulcer on the left ischial tuberosity although there is very little information on this. She comes with an attendant from the facility. 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 Past medical history includes interstitial lung disease, L1 compression fracture, deafness, COPD, Covid pneumonia as described. Socially she has a DSS Child psychotherapist we were not able to contact her before doing this review. My interaction with the patient and allowing her to read my lips I felt that the patient understood what I was saying about her wounds and the need for debridement. I therefore went ahead and treated these aggressively in the clinic. 2/10; patient came in with a necrotic wound on her left greater trochanter last week. Extensive debridement we use Santyl. The wound is cleaned up quite nicely but goes down to periosteum. Lab work I ordered last week showed a white count of 9.3 with a normal differential hemoglobin 8.9. CMP was normal other than an albumin of 3. Her sedimentation rate was 24 and C-reactive protein 19. These are not remarkably high. We could  not get an x-ray of the left greater trochanter done in the facility for some reason so I will order this to be done in the [facility] facility so I can see it online. The patient does not complain of pain 2/17; deep pressure area on the left greater trochanter. The x-ray we obtained at [facility] health showed no specific radiographic evidence of osteomyelitis. Unfortunately the patient fell and fractured her left arm she has a cast on her arm 2/24; deep pressure area on the left greater trochanter. This is down to periosteum in this area and probes superiorly. There is some tissue here. We have a wound VAC and I think it is time to apply it. We will see how the wound reacts to this. There was a slight odor to the wound bed and I did a culture today but no empiric antibiotics. The patient is in an [facility] 3/[facility]; deep pressure ulcer over the left greater trochanter. We put a wound VAC on this last week. She comes in today with a much improved wound. The wound is contracted and I think the undermining area is filled then. I could not easily detect any palpable bone. Culture from last time grew Pseudomonas and methicillin resistant staph aureus [MRSA]. This is never an easy combination. I gave her a combination of Cipro and doxycycline as she was allergic to Bactrim. Both of these will be for 7 days. 3/10; patient is completing her antibiotics. She does not appear to require any additional antibiotics. Wound orifice continues to contract there is a lot of this that is granulated however significant tunneling from  9-3 o'clock 3/24; 2-week follow-up. The patient completed her antibiotics. [facility]; 2-week follow-up. The patient has a smaller wound although that she still has the tunneling area from 10:00. This is certainly come down in size although there is still depth and undermining we are using collagen under the wound VAC. oShe has  painful area just below her antecubital fossa on the left which was under her cast. I think this is a small thrombosed superficial vein. I do not think this needs specific treatment Electronic Signature(s) Signed: 08/27/2019 4:54:03 PM By: Baltazar Najjarobson, Orval Dortch MD Entered By: Baltazar Najjarobson, Tejon Gracie on 08/27/2019 14:39:50 Wempe, Correna ([id number]) -------------------------------------------------------------------------------- Physical Exam Details Patient Name: Alice Wallace, Alice Wallace Date of Service: 08/27/2019 2:00 PM Medical Record Number: [id number] Patient Account Number: [id number] Date of Birth/Sex: 09/29/1940 (79 y.o. F) Treating RN: Huel CoventryWoody, Kim Primary Care Provider: Devoria GlassingLAMBERT, TRACEY Other Clinician: Referring Provider: Devoria GlassingLAMBERT, TRACEY Treating Provider/Extender: Altamese CarolinaOBSON, Trayvon Trumbull G Weeks in Treatment: 9 Constitutional Sitting or standing Blood Pressure is within target range for patient.. Pulse regular and within target range for patient.Marland Kitchen. Respirations regular, non- labored and within target range.. Temperature is normal and within the target range for the patient.Marland Kitchen. appears in no distress. Cardiovascular Left ventral forearm just below the antecubital fossa with a firm small vein nonpulsatile I think this is thrombosed and tender. Could take NSAIDs for this. Notes Wound exam; the patient has much of this wound looking superficial however 10:00 the tunneling area persists. This has depth and it has undermining at the base of the wound although I did not feel any bone no overt infection is seen. There is no erythema around this Electronic Signature(s) Signed: 08/27/2019 4:54:03 PM By: Baltazar Najjarobson, Lorissa Kishbaugh MD Entered By: Baltazar Najjarobson, Yazid Pop on 08/27/2019 14:41:05 Alice Wallace, Alice Wallace ([id number]) -------------------------------------------------------------------------------- Physician Orders Details Patient Name: Alice Wallace, Alice Wallace Date of Service: 08/27/2019 2:00 PM Medical Record Number: [id number] Patient Account Number:  [id number] Date of Birth/Sex: 11/27/1940 (79 y.o. F) Treating RN: Huel CoventryWoody, Kim Primary Care Provider: Devoria GlassingLAMBERT, TRACEY Other Clinician: Referring Provider: Devoria GlassingLAMBERT, TRACEY Treating Provider/Extender: Altamese CarolinaOBSON, Tyan Dy G Weeks in Treatment: 9 Verbal / Phone Orders: No Diagnosis Coding Wound Cleansing Wound #1 Left Trochanter o Clean wound with Normal Saline. Anesthetic (add to Medication List) Wound #1 Left Trochanter o Topical Lidocaine 4% cream applied to wound bed prior to debridement (In Clinic Only). Primary Wound Dressing Wound #1 Left Trochanter o Saline moistened gauze - wet-to-dry in clinic o Silver Collagen - under NPWT-place into tunnel @10 :00 Secondary Dressing Wound #1 Left Trochanter o Boardered Foam Dressing - in clinic Dressing Change Frequency Wound #1 Left Trochanter o Change Dressing Monday, Wednesday, Friday Follow-up Appointments Wound #1 Left Trochanter o Return Appointment in 2 weeks. Off-Loading Wound #1 Left Trochanter o Turn and reposition every 2 hours o Other: - no pressure on wounded area Additional Orders / Instructions Wound #1 Left Trochanter o Increase protein intake. o Activity as tolerated Home Health Wound #1 Left Trochanter o Continue Home Health Visits - [facility]: o Home Health Nurse Alice Wallace visit PRN to address patientos wound care needs. o FACE TO FACE ENCOUNTER: MEDICARE and MEDICAID PATIENTS: I certify that this patient is under my care and that I had a face-to- face encounter that meets the physician face-to-face encounter requirements with this patient on this date. The encounter with the patient was in whole or in part for the following MEDICAL CONDITION: (primary reason for Home Healthcare) MEDICAL NECESSITY: I certify, that based on my findings, NURSING services are a  medically necessary home health service. HOME BOUND STATUS: I certify that my clinical findings support that this patient is homebound (i.e., Due  to illness or injury, pt requires aid of supportive devices such as crutches, cane, wheelchairs, walkers, the use of special transportation or the assistance of another person to leave their place of residence. There is a normal inability to leave the home and doing so requires considerable and taxing effort. Other absences are for medical reasons / religious services and are infrequent or of short duration when for other reasons). o If current dressing causes regression in wound condition, Alice Wallace D/C ordered dressing product/s and apply Normal Saline Moist Dressing daily until next Wound Healing Center / Other MD appointment. Notify Wound Healing Center of regression in wound condition at [phone]. o Please direct any NON-WOUND related issues/requests for orders to patient's Primary Care Physician Erick, Zeriah ([id number]) Negative Pressure Wound Therapy Wound #1 Left Trochanter o Wound VAC settings at 125/130 mmHg continuous pressure. Use BLACK/GREEN foam to wound cavity. Use WHITE foam to fill any tunnel/s and/or undermining. Change VAC dressing 3 X WEEK. Change canister as indicated when full. Nurse Alice Wallace titrate settings and frequency of dressing changes as clinically indicated. - Home health to place wound vac at facility. Place silver collagen into tunnel @ 10:00 under wound vac. o Home Health Nurse Alice Wallace d/c VAC for s/s of increased infection, significant wound regression, or uncontrolled drainage. Notify Wound Healing Center at [phone]. o Apply contact layer over base of wound. - Silver Collagen under sponge at base of wound Electronic Signature(s) Signed: 08/27/2019 4:54:03 PM By: Baltazar Najjar MD Signed: 08/29/2019 10:29:18 AM By: Elliot Gurney, BSN, RN, CWS, Kim RN, BSN Entered By: Elliot Gurney, BSN, RN, CWS, Kim on 08/27/2019 14:26:30 Alice Wallace ([id number]) -------------------------------------------------------------------------------- Problem List Details Patient Name: Alice Wallace Date of Service: 08/27/2019 2:00 PM Medical Record Number: [id number] Patient Account Number: [id number] Date of Birth/Sex: 1940-11-16 (79 y.o. 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 F) Treating RN: Huel Coventry Primary Care Provider: Devoria Glassing Other Clinician: Referring Provider: Devoria Glassing Treating Provider/Extender: Altamese [place] in Treatment: 9 Subjective History of Present Illness (HPI) ADMISSION 06/25/2019 This is a 79 year old woman who comes from the [facility] skilled facility to [place] today for review of a pressure ulcer on her left hip. I really do not know much about this and I am not exactly certain how long this is been present. The patient was hospitalized from 1/16 through 1/19 at [facility] with Covid pneumonia all ended she is signed out is having a pressure ulcer on the left ischial tuberosity although there is very little information on this. She comes with an attendant from the facility. 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 Past medical history includes interstitial lung disease, L1 compression fracture, deafness, COPD, Covid  pneumonia as described. Socially she has a DSS Child psychotherapist we were not  able to contact her before doing this review. My interaction with the patient and allowing her to read my lips I felt that the patient understood what I was saying about her wounds and the need for debridement. I therefore went ahead and treated these aggressively in the clinic. 2/10; patient came in with a necrotic wound on her left greater trochanter last week. Extensive debridement we use Santyl. The wound is cleaned up quite nicely but goes down to periosteum. Lab work I ordered last week showed a white count of 9.3 with a normal differential hemoglobin 8.9. CMP was normal other than an albumin of 3. Her sedimentation rate was 24 and C-reactive protein 19. These are not remarkably high. We could not get an x-ray of the left greater trochanter done in the facility for some reason so I will order this to be done in the [facility] facility so I can see it online. The patient does not complain of pain 2/17; deep pressure area on the left greater trochanter. The x-ray we obtained at [facility] health showed no specific radiographic evidence of osteomyelitis. Unfortunately the patient fell and fractured her left arm she has a cast on her arm 2/24; deep pressure area on the left greater trochanter. This is down to periosteum in this area and probes superiorly. There is some tissue here. We have a wound VAC and I think it is time to apply it. We will see how the wound reacts to this. There was a slight odor to the wound bed and I did a culture today but no empiric antibiotics. The patient is in an [facility] living [facility] 3/3; deep pressure ulcer over the left greater trochanter. We put a wound VAC on this last week. She comes in today with a much improved wound. The wound is contracted and I think the undermining area is filled then. I could not easily detect any palpable bone. Culture from last time grew Pseudomonas and  methicillin resistant staph aureus [MRSA]. This is never an easy combination. I gave her a combination of Cipro and doxycycline as she was allergic to Bactrim. Both of these will be for 7 days. 3/10; patient is completing her antibiotics. She does not appear to require any additional antibiotics. Wound orifice continues to contract there is a lot of this that is granulated however significant tunneling from 9-3 o'clock 3/24; 2-week follow-up. The patient completed her antibiotics. We are using silver collagen under the [facility] 4/7; 2-week follow-up. The patient has a smaller wound although that she still has the tunneling area from 10:00. This is certainly come down in size although there is still depth and undermining we are using collagen under the wound VAC. She has painful area just below her antecubital fossa on the left which was under her cast. I think this is a small thrombosed superficial vein. I do not think this needs specific treatment Objective Constitutional Sitting or standing Blood Pressure is within target range for patient.. Pulse regular and within target range for patient.Marland Kitchen Respirations regular, non- labored and within target range.. Temperature is normal and within the target range for the patient.Marland Kitchen appears in no distress. Vitals Time Taken: 1:55 PM, Height: 64 in, Weight: 120 lbs, BMI: 20.6, Temperature: 99.1 F, Pulse: 90 bpm, Respiratory Rate: 18 breaths/min, Blood Pressure: 126/64 mmHg. Cardiovascular Left ventral forearm just below the antecubital fossa with a firm small vein nonpulsatile I think this is thrombosed and tender. Could take NSAIDs for this. Alice Wallace, Alice Wallace ([id number])  General Notes: Wound exam; the patient has much of this wound looking superficial however 10:00 the tunneling area persists. This has depth and it has undermining at the base of the wound although I did not feel any bone no overt infection is seen. There is no erythema around this Integumentary  (Hair, Skin) Wound #1 status is Open. Original cause of wound was Pressure Injury. The wound is located on the Left Trochanter. The wound measures 1.7cm length x 3cm width x 1.3cm depth; 4.006cm^2 area and 5.207cm^3 volume. There is Fat Layer (Subcutaneous Tissue) Exposed exposed. There is no undermining noted, however, there is tunneling at 10:00 with a maximum distance of 3cm. There is a medium amount of serous drainage noted. Foul odor after cleansing was noted. The wound margin is flat and intact. There is large (67-100%) red granulation within the wound bed. There is a small (1-33%) amount of necrotic tissue within the wound bed including Adherent Slough. Assessment Active Problems ICD-10 Pressure ulcer of right hip, stage 4 Personal history of COVID-19 Plan Wound Cleansing: Wound #1 Left Trochanter: Clean wound with Normal Saline. Anesthetic (add to Medication List): Wound #1 Left Trochanter: Topical Lidocaine 4% cream applied to wound bed prior to debridement (In Clinic Only). Primary Wound Dressing: Wound #1 Left Trochanter: Saline moistened gauze - wet-to-dry in clinic Silver Collagen - under NPWT-place into tunnel :00 Secondary Dressing: Wound #1 Left Trochanter: Boardered Foam Dressing - in clinic Dressing Change Frequency: Wound #1 Left Trochanter: Change Dressing Monday, Wednesday, Friday Follow-up Appointments: Wound #1 Left Trochanter: Return Appointment in 2 weeks. Off-Loading: Wound #1 Left Trochanter: Turn and reposition every 2 hours Other: - no pressure on wounded area Additional Orders / Instructions: Wound #1 Left Trochanter: Increase protein intake. Activity as tolerated Home Health: Wound #1 Left Trochanter: Continue Home Health Visits - [facility]: Home Health Nurse Alice Wallace visit PRN to address patient s wound care needs. FACE TO FACE ENCOUNTER: MEDICARE and MEDICAID PATIENTS: I certify that this patient is under my care and that I had a  face-to-face encounter that meets the physician face-to-face encounter requirements with this patient on this date. The encounter with the patient was in whole or in part for the following MEDICAL CONDITION: (primary reason for Home Healthcare) MEDICAL NECESSITY: I certify, that based on my findings, NURSING services are a medically necessary home health service. HOME BOUND STATUS: I certify that my clinical findings support that this patient is homebound (i.e., Due to illness or injury, pt requires aid of supportive devices such as crutches, cane, wheelchairs, walkers, the use of special transportation or the assistance of another person to leave their place of residence. There is a normal inability to leave the home and doing so requires considerable and taxing effort. Other absences are for medical reasons / religious services and are infrequent or of short duration when for other reasons). If current dressing causes regression in wound condition, Alice Wallace D/C ordered dressing product/s and apply Normal Saline Moist Dressing daily until next Alice Wallace, Alice Wallace ([id number]) Wound Healing Center / Other MD appointment. Notify Wound Healing Center of regression in wound condition at [phone]. Please direct any NON-WOUND related issues/requests for orders to patient's Primary Care Physician Negative Pressure Wound Therapy: Wound #1 Left Trochanter: Wound VAC settings at 125/130 mmHg continuous pressure. Use BLACK/GREEN foam to wound cavity. Use WHITE foam to fill any tunnel/s and/or undermining. Change VAC dressing 3 X WEEK. Change canister as indicated when full. Nurse Alice Wallace titrate settings and frequency of dressing changes as  clinically indicated. - Home health to place wound vac at facility. Place silver collagen into tunnel @ 10:00 under wound vac. Home Health Nurse Alice Wallace d/c VAC for s/s of increased infection, significant wound regression, or uncontrolled drainage. [facility] at  [phone]. Apply contact layer over base of wound. - Silver Collagen under sponge at base of wound 1. We continue with the wound VAC with collagen under the foam especially in the tunneling area. 2. No current evidence of infection although we continue to be vigilant about this Electronic Signature(s) Signed: 08/27/2019 4:54:03 PM By: Linton Ham MD Entered By: Linton Ham on 08/27/2019 14:41:40 Alice Wallace, Alice Wallace ([id number]) -------------------------------------------------------------------------------- [place] Details Patient Name: Alice Wallace Date of Service: 08/27/2019 Medical Record Number: [id number] Patient Account Number: [id number] Date of Birth/Sex: 18-Sep-1940 (79 y.o. 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

## 2019-09-03 NOTE — Progress Notes (Signed)
LUSIA, GREIS ([id number]) Visit Report for 08/13/2019 HPI Details Patient Name: Alice Wallace, Alice Wallace Date of Service: 08/13/2019 1:45 PM Medical Record Number: [id number] Patient Account Number: [id number] Date of Birth/Sex: 1940-10-24 (79 y.o. F) Treating RN: Huel Coventry Primary Care Provider: Devoria Glassing Other Clinician: Referring Provider: Devoria Glassing Treating Provider/Extender: Altamese [place] in Treatment: 7 History of Present Illness HPI Description: ADMISSION 06/25/2019 This is a 79 year old woman who comes from the [facility] facility to [place] today for review of a pressure ulcer on her left hip. I really do not know much about this and I am not exactly certain how long this is been present. The patient was hospitalized from 1/16 through 1/19 at [facility] with Covid pneumonia all ended she is signed out is having a pressure ulcer on the left ischial tuberosity although there is very little information on this. She comes with an attendant from the facility. 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 Past medical history includes interstitial lung disease, L1 compression fracture, deafness, COPD, Covid pneumonia as described. Socially she has a DSS Child psychotherapist we were not able to contact her before doing this review. My interaction with the patient and allowing her to read my lips I felt that the patient understood what I was saying about her wounds and the need for debridement. I therefore went ahead and treated these aggressively in the clinic. 2/10; patient came in with a necrotic wound on her left greater trochanter last week. Extensive debridement we use Santyl. The wound is cleaned up quite nicely but goes down to periosteum. Lab work I ordered last week showed a white count of 9.3 with a normal differential hemoglobin 8.9. CMP was normal other than an albumin of 3. Her sedimentation rate was 24 and C-reactive protein 19. These are not remarkably high. We  could not get an x-ray of the left greater trochanter done in the facility for some reason so I will order this to be done in the [facility] facility so I can see it online. The patient does not complain of pain 2/17; deep pressure area on the left greater trochanter. The x-ray we obtained at [facility] showed no specific radiographic evidence of osteomyelitis. Unfortunately the patient fell and fractured her left arm she has a cast on her arm 2/24; deep pressure area on the left greater trochanter. This is down to periosteum in this area and probes superiorly. There is some tissue here. We have a wound VAC and I think it is time to apply it. We will see how the wound reacts to this. There was a slight odor to the wound bed and I did a culture today but no empiric antibiotics. The patient is in an [facility] 3/[facility]; deep pressure ulcer over the left greater trochanter. We put a wound VAC on this last week. She comes in today with a much improved wound. The wound is contracted and I think the undermining area is filled then. I could not easily detect any palpable bone. Culture from last time grew Pseudomonas and methicillin resistant staph aureus [MRSA]. This is never an easy combination. I gave her a combination of Cipro and doxycycline as she was allergic to Bactrim. Both of these will be for 7 days. 3/10; patient is completing her antibiotics. She does not appear to require any additional antibiotics. Wound orifice continues to contract there is a lot of this that is granulated however significant tunneling from  9-3 o'clock 3/24; 2-week follow-up. The patient completed her antibiotics. We are [facility] silver [facility] Electronic Signature(s) Signed: 08/14/2019 4:13:18 AM By: Baltazar Najjarobson, Azie Mcconahy MD Entered By: Baltazar Najjarobson, Syan Cullimore on 08/13/2019 14:43:41 Alice Wallace, Alice Wallace ([id number]) -------------------------------------------------------------------------------- Physical  Exam Details Patient Name: Alice LeveringUPREE, Alice Date of Service: 08/13/2019 1:45 PM Medical Record Number: [id number] Patient Account Number: [id number] Date of Birth/Sex: 01/14/1941 (79 y.o. F) Treating RN: Huel CoventryWoody, Kim Primary Care Provider: Devoria GlassingLAMBERT, TRACEY Other Clinician: Referring Provider: Devoria GlassingLAMBERT, TRACEY Treating Provider/Extender: Altamese CarolinaOBSON, Macey Wurtz G Weeks in Treatment: 7 Constitutional Sitting or standing Blood Pressure is within target range for patient.. Pulse regular and within target range for patient.Marland Kitchen. Respirations regular, non- labored and within target range.. Temperature is normal and within the target range for the patient.Marland Kitchen. appears in no distress. Respiratory Respiratory effort is easy and symmetric bilaterally. Rate is normal at rest and on room air.. Cardiovascular Appears well-hydrated. Notes Wound exam; the patient has much of this wound looking superficial however from 9-3 o'clock there is a deep tunneling area and if you manipulate the overlying skin this goes down to the greater trochanter itself. I did not note this when she was here 2 weeks ago either because it was not there I did not appreciate it. I did not see any current evidence of infection but will need to be continually vigilant about this. Electronic Signature(s) Signed: 08/14/2019 4:13:18 AM By: Baltazar Najjarobson, Obdulio Mash MD Entered By: Baltazar Najjarobson, Petr Bontempo on 08/13/2019 14:45:04 Alice LeveringUPREE, Alice ([id number]) -------------------------------------------------------------------------------- Physician Orders Details Patient Name: Alice LeveringUPREE, Alice Date of Service: 08/13/2019 1:45 PM Medical Record Number: [id number] Patient Account Number: [id number] Date of Birth/Sex: 12/29/1940 (79 y.o. F) Treating RN: Huel CoventryWoody, Kim Primary Care Provider: Devoria GlassingLAMBERT, TRACEY Other Clinician: Referring Provider: Devoria GlassingLAMBERT, TRACEY Treating Provider/Extender: Altamese CarolinaOBSON, Daysean Tinkham G Weeks in Treatment: 7 Verbal / Phone Orders: No Diagnosis Coding Wound  Cleansing Wound #1 Left Trochanter o Clean wound with Normal Saline. Anesthetic (add to Medication List) Wound #1 Left Trochanter o Topical Lidocaine 4% cream applied to wound bed prior to debridement (In Clinic Only). Primary Wound Dressing Wound #1 Left Trochanter o Saline moistened gauze - wet-to-dry in clinic o Silver Collagen - under NPWT-place into tunnel and undermining. Secondary Dressing Wound #1 Left Trochanter o Boardered Foam Dressing - in clinic Dressing Change Frequency Wound #1 Left Trochanter o Change Dressing Monday, Wednesday, Friday Follow-up Appointments Wound #1 Left Trochanter o Return Appointment in 2 weeks. Off-Loading Wound #1 Left Trochanter o Turn and reposition every 2 hours o Other: - no pressure on wounded area Additional Orders / Instructions Wound #1 Left Trochanter o Increase protein intake. o Activity as tolerated Home Health Wound #1 Left Trochanter o Continue Home Health Visits - [facility]: o Home Health Nurse may visit PRN to address patientos wound care needs. o FACE TO FACE ENCOUNTER: MEDICARE and MEDICAID PATIENTS: I certify that this patient is under my care and that I had a face-to- face encounter that meets the physician face-to-face encounter requirements with this patient on this date. The encounter with the patient was in whole or in part for the following MEDICAL CONDITION: (primary reason for Home Healthcare) MEDICAL NECESSITY: I certify, that based on my findings, NURSING services are a medically necessary home health service. HOME BOUND STATUS: I certify that my clinical findings support that this patient is homebound (i.Alice., Due to illness or injury, pt requires aid of supportive devices such as crutches, cane, wheelchairs, walkers, the use of special transportation or the assistance of another person to leave their  place of residence. There is a normal inability to leave the home and doing so requires  considerable and taxing effort. Other absences are for medical reasons / religious services and are infrequent or of short duration when for other reasons). o If current dressing causes regression in wound condition, may D/C ordered dressing product/s and apply Normal Saline Moist Dressing daily until next Wound Healing Center / Other MD appointment. Notify Wound Healing Center of regression in wound condition at [phone]. o Please direct any NON-WOUND related issues/requests for orders to patient's Primary Care Physician Normandy, Takeyla ([phone]) Negative Pressure Wound Therapy Wound #1 Left Trochanter o Wound VAC settings at 125/130 mmHg continuous pressure. Use BLACK/GREEN foam to wound cavity. Use WHITE foam to fill any tunnel/s and/or undermining. Change VAC dressing 3 X WEEK. Change canister as indicated when full. Nurse may titrate settings and frequency of dressing changes as clinically indicated. - Home health to place wound vac at facility. Place silver collagen into tunnel and undermining under wound vac. o Home Health Nurse may d/c VAC for s/s of increased infection, significant wound regression, or uncontrolled drainage. Notify Wound Healing Center at [phone]. o Apply contact layer over base of wound. - Silver Collagen under sponge at base of wound Electronic Signature(s) Signed: 08/14/2019 4:13:18 AM By: Baltazar Najjar MD Signed: 09/03/2019 11:11:47 AM By: Elliot Gurney, BSN, RN, CWS, Kim RN, BSN Entered By: Elliot Gurney, BSN, RN, CWS, Kim on 08/13/2019 14:37:22 Wallace, Alice Wallace ([id number]) -------------------------------------------------------------------------------- Problem List Details Patient Name: Alice Wallace Date of Service: 08/13/2019 1:45 PM Medical Record Number: [id number] Patient Account Number: [id number] Date of Birth/Sex: Oct 09, 1940 (79 y.o. 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 F) Treating RN: Huel Coventry Primary Care Provider: Devoria Glassing Other Clinician: Referring Provider: Devoria Glassing Treating Provider/Extender: Altamese [place] in Treatment: 7 Subjective History of Present Illness (HPI) ADMISSION 06/25/2019 This is a 79 year old woman who comes from the [facility] skilled facility to [place] today for review of a pressure ulcer on her left hip. I really do not know much about this and I am not exactly certain how long this is been present. The patient was hospitalized from 1/16 through 1/19 at [facility] with Covid pneumonia all ended she is signed out is having a pressure ulcer on the left ischial tuberosity although there is very little information on this. She comes with an attendant from the facility. 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 Past medical history includes interstitial lung disease, L1 compression fracture, deafness, COPD, Covid pneumonia as described. Socially she has a DSS Child psychotherapist we were not able to contact her before doing this review. My interaction with the patient and allowing her to read my lips I felt that the patient understood what I was saying about her wounds and the  need for debridement. I therefore went ahead and treated these aggressively in the clinic. 2/10; patient came  in with a necrotic wound on her left greater trochanter last week. Extensive debridement we use Santyl. The wound is cleaned up quite nicely but goes down to periosteum. Lab work I ordered last week showed a white count of 9.3 with a normal differential hemoglobin 8.9. CMP was normal other than an albumin of 3. Her sedimentation rate was 24 and C-reactive protein 19. These are not remarkably high. We could not get an x-ray of the left greater trochanter done in the facility for some reason so I will order this to be done in the [facility] facility so I can see it online. The patient does not complain of pain 2/17; deep pressure area on the left greater trochanter. The x-ray we obtained at [facility] showed no specific radiographic evidence of osteomyelitis. Unfortunately the patient fell and fractured her left arm she has a cast on her arm 2/24; deep pressure area on the left greater trochanter. This is down to periosteum in this area and probes superiorly. There is some tissue here. We have a wound VAC and I think it is time to apply it. We will see how the wound reacts to this. There was a slight odor to the wound bed and I did a culture today but no empiric antibiotics. The patient is in an [facility] living [facility] 3/[facility]; deep pressure ulcer over the left greater trochanter. We put a wound VAC on this last week. She comes in today with a much improved wound. The wound is contracted and I think the undermining area is filled then. I could not easily detect any palpable bone. Culture from last time grew Pseudomonas and methicillin resistant staph aureus [MRSA]. This is never an easy combination. I gave her a combination of Cipro and doxycycline as she was allergic to Bactrim. Both of these will be for 7 days. 3/10; patient is completing her antibiotics. She does not appear to  require any additional antibiotics. Wound orifice continues to contract there is a lot of this that is granulated however significant tunneling from 9-3 o'clock 3/24; 2-week follow-up. The patient completed her antibiotics. We are using silver collagen under the VAC Objective Constitutional Sitting or standing Blood Pressure is within target range for patient.. Pulse regular and within target range for patient.Marland Kitchen Respirations regular, non- labored and within target range.. Temperature is normal and within the target range for the patient.Marland Kitchen appears in no distress. Vitals Time Taken: 2:16 PM, Height: 64 in, Weight: 120 lbs, BMI: 20.6, Temperature: 99.6 F, Pulse: 93 bpm, Respiratory Rate: 16 breaths/min, Blood Pressure: 120/54 mmHg. Respiratory Respiratory effort is easy and symmetric bilaterally. Rate is normal at rest and on room air.. Cardiovascular Appears well-hydrated. Alice Wallace, Alice Wallace ([id number]) General Notes: Wound exam; the patient has much of this wound looking superficial however from 9-3 o'clock there is a deep tunneling area and if you manipulate the overlying skin this goes down to the greater trochanter itself. I did not note this when she was here 2 weeks ago either because it was not there I did not appreciate it. I did not see any current evidence of infection but will need to be continually vigilant about this. Integumentary (Hair, Skin) Wound #1 status is Open. Original cause of wound was Pressure Injury. The wound is located on the Left Trochanter. The wound measures 2.3cm length x 3cm width x 2.8cm depth; 5.419cm^2 area and 15.174cm^3 volume. There is Fat Layer (Subcutaneous Tissue) Exposed exposed. There is no tunneling noted, however, there is  undermining starting at 9:00 and ending at 3:00 with a maximum distance of 4.4cm. There is a large amount of serosanguineous drainage noted. Foul odor after cleansing was noted. The wound margin is flat and intact. There is large  (67-100%) red granulation within the wound bed. There is a small (1-33%) amount of necrotic tissue within the wound bed including Adherent Slough. Assessment Active Problems ICD-10 Pressure ulcer of right hip, stage 4 Personal history of COVID-19 Plan Wound Cleansing: Wound #1 Left Trochanter: Clean wound with Normal Saline. Anesthetic (add to Medication List): Wound #1 Left Trochanter: Topical Lidocaine 4% cream applied to wound bed prior to debridement (In Clinic Only). Primary Wound Dressing: Wound #1 Left Trochanter: Saline moistened gauze - wet-to-dry in clinic Silver Collagen - under NPWT-place into tunnel and undermining. Secondary Dressing: Wound #1 Left Trochanter: Boardered Foam Dressing - in clinic Dressing Change Frequency: Wound #1 Left Trochanter: Change Dressing Monday, Wednesday, Friday Follow-up Appointments: Wound #1 Left Trochanter: Return Appointment in 2 weeks. Off-Loading: Wound #1 Left Trochanter: Turn and reposition every 2 hours Other: - no pressure on wounded area Additional Orders / Instructions: Wound #1 Left Trochanter: Increase protein intake. Activity as tolerated Home Health: Wound #1 Left Trochanter: Continue Home Health Visits - [facility]: Home Health Nurse may visit PRN to address patient s wound care needs. FACE TO FACE ENCOUNTER: MEDICARE and MEDICAID PATIENTS: I certify that this patient is under my care and that I had a face-to-face encounter that meets the physician face-to-face encounter requirements with this patient on this date. The encounter with the patient was in whole or in part for the following MEDICAL CONDITION: (primary reason for Home Healthcare) MEDICAL NECESSITY: I certify, that based on my findings, NURSING services are a medically necessary home health service. HOME BOUND STATUS: I certify that my clinical findings support that this patient is homebound (i.Alice., Due to illness or injury, pt requires aid of supportive  devices such as crutches, cane, wheelchairs, walkers, the use of special transportation or the assistance of another person to leave their place of residence. There is a normal inability to leave the home and doing so requires considerable and taxing effort. Other absences are for medical reasons / religious services and are infrequent or of short duration when for other reasons). If current dressing causes regression in wound condition, may D/C ordered dressing product/s and apply Normal Saline Moist Dressing daily until next Wound Healing Center / Other MD appointment. Notify Wound Healing Center of regression in wound condition at [phone]. Alice Wallace, Alice Wallace ([id number]) Please direct any NON-WOUND related issues/requests for orders to patient's Primary Care Physician Negative Pressure Wound Therapy: Wound #1 Left Trochanter: Wound VAC settings at 125/130 mmHg continuous pressure. Use BLACK/GREEN foam to wound cavity. Use WHITE foam to fill any tunnel/s and/or undermining. Change VAC dressing 3 X WEEK. Change canister as indicated when full. Nurse may titrate settings and frequency of dressing changes as clinically indicated. - Home health to place wound vac at facility. Place silver collagen into tunnel and undermining under wound vac. Home Health Nurse may d/c VAC for s/s of increased infection, significant wound regression, or uncontrolled drainage. Notify Wound Healing Center at [phone]. Apply contact layer over base of wound. - Silver Collagen under sponge at base of wound 1. Continue silver collagen under the VAC. Particularly need to pay attention to putting silver collagen in the tunnel here 2. Continued vigilance about the possibility of recurrent infection in the tissue over the bone in the tunnel. It  would be possible to do additional cultures but I did not think any were indicated right now Electronic Signature(s) Signed: 08/14/2019 4:13:18 AM By: Linton Ham MD Entered  By: Linton Ham on 08/13/2019 14:49:05 Wallace, Alice ([id number]) -------------------------------------------------------------------------------- [place] Details Patient Name: Alice Wallace Date of Service: 08/13/2019 Medical Record Number: [id number] Patient Account Number: [id number] Date of Birth/Sex: 1940/09/09 (79 y.o. 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

## 2019-09-03 NOTE — Progress Notes (Signed)
ROSANGELA, FEHRENBACH ([id number]) Visit Report for 08/13/2019 Arrival Information Details Patient Name: Alice Wallace, Alice Wallace Date of Service: 08/13/2019 1:45 PM Medical Record Number: [id number] Patient Account Number: [id number] Date of Birth/Sex: [birth year] (79 y.o. F) Treating RN: Curtis Sites Primary Care Truxton Stupka: Devoria Glassing Other Clinician: Referring Dayannara Pascal: Devoria Glassing Treating Tabor Bartram/Extender: Altamese [place] in Treatment: 7 Visit Information History Since Last Visit Added or deleted any medications: No Patient Arrived: Walker Any new allergies or adverse reactions: No Arrival Time: 14:12 Had a fall or experienced change in No Accompanied By: caregiver activities of daily living that may affect Transfer Assistance: None risk of falls: Patient Identification Verified: Yes Signs or symptoms of abuse/neglect since last visito No Secondary Verification Process Completed: Yes Hospitalized since last visit: No Implantable device outside of the clinic excluding No cellular tissue based products placed in the center since last visit: Has Dressing in Place as Prescribed: Yes Pain Present Now: No Electronic Signature(s) Signed: 08/13/2019 4:33:06 PM By: Curtis Sites Entered By: Curtis Sites on 08/13/2019 14:13:09 Antolin, Jachelle ([id number]) -------------------------------------------------------------------------------- Clinic Level of Care Assessment Details Patient Name: Alice Wallace Date of Service: 08/13/2019 1:45 PM Medical Record Number: [id number] Patient Account Number: [id number] Date of Birth/Sex: [birth year] (78 y.o. F) Treating RN: Huel Coventry Primary Care Terralyn Matsumura: Devoria Glassing Other Clinician: Referring Zurri Rudden: Devoria Glassing Treating Quadry Kampa/Extender: Altamese [place] in Treatment: 7 Clinic Level of Care Assessment Items TOOL 4 Quantity Score []  - Use when only an EandM is performed on FOLLOW-UP visit 0 ASSESSMENTS - Nursing Assessment /  Reassessment X - Reassessment of Co-morbidities (includes updates in patient status) 1 10 X- 1 5 Reassessment of Adherence to Treatment Plan ASSESSMENTS - Wound and Skin Assessment / Reassessment X - Simple Wound Assessment / Reassessment - one wound 1 5 []  - 0 Complex Wound Assessment / Reassessment - multiple wounds []  - 0 Dermatologic / Skin Assessment (not related to wound area) ASSESSMENTS - Focused Assessment []  - Circumferential Edema Measurements - multi extremities 0 []  - 0 Nutritional Assessment / Counseling / Intervention []  - 0 Lower Extremity Assessment (monofilament, tuning fork, pulses) []  - 0 Peripheral Arterial Disease Assessment (using hand held doppler) ASSESSMENTS - Ostomy and/or Continence Assessment and Care []  - Incontinence Assessment and Management 0 []  - 0 Ostomy Care Assessment and Management (repouching, etc.) PROCESS - Coordination of Care X - Simple Patient / Family Education for ongoing care 1 15 []  - 0 Complex (extensive) Patient / Family Education for ongoing care X- 1 10 Staff obtains , Records, Test Results / Process Orders []  - 0 Staff telephones HHA, Nursing Homes / Clarify orders / etc []  - 0 Routine Transfer to another Facility (non-emergent condition) []  - 0 Routine Hospital Admission (non-emergent condition) []  - 0 New Admissions / / Ordering NPWT, Apligraf, etc. []  - 0 Emergency Hospital Admission (emergent condition) X- 1 10 Simple Discharge Coordination []  - 0 Complex (extensive) Discharge Coordination PROCESS - Special Needs []  - Pediatric / Minor Patient Management 0 []  - 0 Isolation Patient Management []  - 0 Hearing / Language / Visual special needs []  - 0 Assessment of Community assistance (transportation, D/C planning, etc.) Horgan, Clemencia ( ) []  - 0 Additional assistance / Altered mentation []  - 0 Support Surface(s) Assessment (bed, cushion, seat, etc.) INTERVENTIONS -  Wound Cleansing / Measurement X - Simple Wound Cleansing - one wound 1 5 []  - 0 Complex Wound Cleansing - multiple wounds X- 1 5 Wound Imaging (photographs - any number of  wounds) []  - 0 Wound Tracing (instead of photographs) X- 1 5 Simple Wound Measurement - one wound []  - 0 Complex Wound Measurement - multiple wounds INTERVENTIONS - Wound Dressings []  - Small Wound Dressing one or multiple wounds 0 X- 1 15 Medium Wound Dressing one or multiple wounds []  - 0 Large Wound Dressing one or multiple wounds []  - 0 Application of Medications - topical []  - 0 Application of Medications - injection INTERVENTIONS - Miscellaneous []  - External ear exam 0 []  - 0 Specimen Collection (cultures, biopsies, blood, body fluids, etc.) []  - 0 Specimen(s) / Culture(s) sent or taken to Lab for analysis []  - 0 Patient Transfer (multiple staff / Civil Service fast streamer / Similar devices) []  - 0 Simple Staple / Suture removal (25 or less) []  - 0 Complex Staple / Suture removal (26 or more) []  - 0 Hypo / Hyperglycemic Management (close monitor of Blood Glucose) []  - 0 Ankle / Brachial Index (ABI) - do not check if billed separately X- 1 5 Vital Signs Has the patient been seen at the hospital within the last three years: Yes Total Score: 90 Level Of Care: New/Established - Level 3 Electronic Signature(s) Signed: 09/03/2019 11:11:47 AM By: Gretta Cool, BSN, RN, CWS, Kim RN, BSN Entered By: Gretta Cool, BSN, RN, CWS, Kim on 08/13/2019 14:38:04 Alice Wallace ([id number]) -------------------------------------------------------------------------------- Encounter Discharge Information Details Patient Name: Alice Wallace Date of Service: 08/13/2019 1:45 PM Medical Record Number: [id number] Patient Account Number: [id number] Date of Birth/Sex: 27-Dec-1940 (78 y.o. F) Treating RN: Cornell Barman Primary Care Ethaniel Garfield: Odessa Fleming Other Clinician: Referring Jemiah Cuadra: Odessa Fleming Treating Maryah Marinaro/Extender: Tito Dine in Treatment: 7 Encounter Discharge Information Items Discharge Condition: Stable Ambulatory Status: Ambulatory Discharge Destination: Home Transportation: Private Auto Accompanied By: caregiver Schedule Follow-up Appointment: Yes Clinical Summary of Care: Electronic Signature(s) Signed: 09/03/2019 11:11:47 AM By: Gretta Cool, BSN, RN, CWS, Kim RN, BSN Entered By: Gretta Cool, BSN, RN, CWS, Kim on 08/13/2019 14:39:37 Alice Wallace ([id number]) -------------------------------------------------------------------------------- Lower Extremity Assessment Details Patient Name: Alice Wallace Date of Service: 08/13/2019 1:45 PM Medical Record Number: [id number] Patient Account Number: [id number] Date of Birth/Sex: [birth year] (78 y.o. F) Treating RN: Montey Hora Primary Care Morelia Cassells: Odessa Fleming Other Clinician: Referring Graiden Henes: Odessa Fleming Treating Demir Titsworth/Extender: Ricard Dillon Weeks in Treatment: 7 Electronic Signature(s) Signed: 08/13/2019 4:33:06 PM By: Montey Hora Entered By: Montey Hora on 08/13/2019 14:14:09 Moncus, Tahnee ([id number]) -------------------------------------------------------------------------------- Multi Wound Chart Details Patient Name: Alice Wallace Date of Service: 08/13/2019 1:45 PM Medical Record Number: [id number] Patient Account Number: [id number] Date of Birth/Sex: 1941/03/04 (78 y.o. F) Treating RN: Cornell Barman Primary Care Keiosha Cancro: Odessa Fleming Other Clinician: Referring Jess Sulak: Odessa Fleming Treating Garritt Molyneux/Extender: Tito Dine in Treatment: 7 Vital Signs Height(in): 64 Pulse(bpm): 93 Weight(lbs): 120 Blood Pressure(mmHg): 120/54 Body Mass Index(BMI): 21 Temperature(F): 99.6 Respiratory Rate(breaths/min): 16 Photos: [N/A:N/A] Wound Location: Left Trochanter N/A N/A Wounding Event: Pressure Injury N/A N/A Primary Etiology: Pressure Ulcer N/A N/A Comorbid History: Anemia, Chronic Obstructive N/A  N/A Pulmonary Disease (COPD), Arrhythmia, Hypertension Date Acquired: 04/22/2019 N/A N/A Weeks of Treatment: 7 N/A N/A Wound Status: Open N/A N/A Measurements L x W x D (cm) 2.3x3x2.8 N/A N/A Area (cm) : 5.419 N/A N/A Volume (cm) : 15.174 N/A N/A % Reduction in Area: 55.80% N/A N/A % Reduction in Volume: 11.50% N/A N/A Starting Position 1 (o'clock): 9 Ending Position 1 (o'clock): 3 Maximum Distance 1 (cm): 4.4 Undermining: Yes N/A N/A Classification: Category/Stage III N/A N/A Exudate Amount: Large N/A N/A Exudate Type:  Serosanguineous N/A N/A Exudate Color: red, brown N/A N/A Foul Odor After Cleansing: Yes N/A N/A Odor Anticipated Due to Product No N/A N/A Use: Wound Margin: Flat and Intact N/A N/A Granulation Amount: Large (67-100%) N/A N/A Granulation Quality: Red N/A N/A Necrotic Amount: Small (1-33%) N/A N/A Exposed Structures: Fat Layer (Subcutaneous Tissue) N/A N/A Exposed: Yes Fascia: No Tendon: No Muscle: No Joint: No Bone: No Epithelialization: None N/A N/A Freedman, Lauris ([id number]) Treatment Notes Wound #1 (Left Trochanter) Notes [place] secured with bordered foam dressing Electronic Signature(s) Signed: 08/14/2019 4:13:18 AM By: Baltazar Najjar MD Entered By: Baltazar Najjar on 08/13/2019 14:42:43 Kolbe, Vivion ([id number]) -------------------------------------------------------------------------------- Multi-Disciplinary Care Plan Details Patient Name: Alice Wallace Date of Service: 08/13/2019 1:45 PM Medical Record Number: [id number] Patient Account Number: [id number] Date of Birth/Sex: 1940-12-02 (79 y.o. F) Treating RN: Huel Coventry Primary Care Jeven Topper: Devoria Glassing Other Clinician: Referring Joclyn Alsobrook: Devoria Glassing Treating Sinthia Karabin/Extender: Altamese [place] in Treatment: 7 Active Inactive Medication Nursing Diagnoses: Knowledge deficit related to medication safety: actual or potential Goals: Patient/caregiver will demonstrate  understanding of all current medications Date Initiated: 07/02/2019 Target Resolution Date: 07/31/2019 Goal Status: Active Interventions: Assess for medication contraindications each visit where new medications are prescribed Notes: Orientation to the Wound Care Program Nursing Diagnoses: Knowledge deficit related to the wound healing center program Goals: Patient/caregiver will verbalize understanding of the Wound Healing Center Program Date Initiated: 07/02/2019 Target Resolution Date: 07/23/2019 Goal Status: Active Interventions: Provide education on orientation to the wound center Notes: Pressure Nursing Diagnoses: Knowledge deficit related to management of pressures ulcers Goals: Patient will remain free of pressure ulcers Date Initiated: 07/02/2019 Target Resolution Date: 07/16/2019 Goal Status: Active Interventions: Provide education on pressure ulcers Notes: Wound/Skin Impairment Nursing Diagnoses: Impaired tissue integrity Knowledge deficit related to smoking impact on wound healing Plamondon, Dail ([id number]) Goals: Patient/caregiver will verbalize understanding of skin care regimen Date Initiated: 07/02/2019 Target Resolution Date: 07/23/2019 Goal Status: Active Ulcer/skin breakdown will have a volume reduction of 30% by week 4 Date Initiated: 07/02/2019 Target Resolution Date: 07/30/2019 Goal Status: Active Interventions: Assess ulceration(s) every visit Provide education on ulcer and skin care Notes: Electronic Signature(s) Signed: 09/03/2019 11:11:47 AM By: Elliot Gurney, BSN, RN, CWS, Kim RN, BSN Entered By: Elliot Gurney, BSN, RN, CWS, Kim on 08/13/2019 14:33:52 Westergard, Carsynn ([id number]) -------------------------------------------------------------------------------- Pain Assessment Details Patient Name: Alice Wallace Date of Service: 08/13/2019 1:45 PM Medical Record Number: [id number] Patient Account Number: [id number] Date of Birth/Sex: Sep 17, 1940 (78 y.o. F) Treating RN:  Curtis Sites Primary Care Abigale Dorow: Devoria Glassing Other Clinician: Referring Veto Macqueen: Devoria Glassing Treating Leyna Vanderkolk/Extender: Altamese [place] in Treatment: 7 Active Problems Location of Pain Severity and Description of Pain Patient Has Paino Yes Site Locations Pain Location: Pain in Ulcers Pain Management and Medication Current Pain Management: Notes pain from NPWT tape Electronic Signature(s) Signed: 08/13/2019 4:33:06 PM By: Curtis Sites Entered By: Curtis Sites on 08/13/2019 14:14:01 Kanitz, Arnold ([id number]) -------------------------------------------------------------------------------- Patient/Caregiver Education Details Patient Name: Alice Wallace Date of Service: 08/13/2019 1:45 PM Medical Record Number: [id number] Patient Account Number: [id number] Date of Birth/Gender: April 28, 1941 (79 y.o. F) Treating RN: Huel Coventry Primary Care Physician: Devoria Glassing Other Clinician: Referring Physician: Devoria Glassing Treating Physician/Extender: Altamese [place] in Treatment: 7 Education Assessment Education Provided To: Patient Education Topics Provided Pressure: Handouts: Pressure Ulcers: Care and Offloading Methods: Demonstration, Explain/Verbal Responses: State content correctly Wound/Skin Impairment: Handouts: Caring for Your Ulcer Methods: Demonstration, Explain/Verbal Responses: State content correctly Electronic Signature(s) Signed: 09/03/2019 11:11:47 AM By: Elliot Gurney, BSN, RN,  CWS, Kim RN, BSN Entered By: Elliot Gurney, BSN, RN, CWS, Kim on 08/13/2019 14:38:34 Alice Wallace ([id number]) -------------------------------------------------------------------------------- Wound Assessment Details Patient Name: Alice Wallace Date of Service: 08/13/2019 1:45 PM Medical Record Number: [id number] Patient Account Number: [id number] Date of Birth/Sex: 1941-04-02 (78 y.o. F) Treating RN: Curtis Sites Primary Care Aileen Amore: Devoria Glassing Other  Clinician: Referring Hailly Fess: Devoria Glassing Treating Archana Eckman/Extender: Altamese [place] in Treatment: 7 Wound Status Wound Number: 1 Primary Pressure Ulcer Etiology: Wound Location: Left Trochanter Wound Status: Open Wounding Event: Pressure Injury Comorbid Anemia, Chronic Obstructive Pulmonary Disease (COPD), Date Acquired: 04/22/2019 History: Arrhythmia, Hypertension Weeks Of Treatment: 7 Clustered Wound: No Photos Photo Uploaded By: Curtis Sites on 08/13/2019 14:26:22 Wound Measurements Length: (cm) 2.3 % Width: (cm) 3 % Depth: (cm) 2.8 Ep Area: (cm) 5.419 T Volume: (cm) 15.174 U Reduction in Area: 55.8% Reduction in Volume: 11.5% ithelialization: None unneling: No ndermining: Yes Starting Position (o'clock): 9 Ending Position (o'clock): 3 Maximum Distance: (cm) 4.4 Wound Description Classification: Category/Stage III F Wound Margin: Flat and Intact D Exudate Amount: Large S Exudate Type: Serosanguineous Exudate Color: red, brown oul Odor After Cleansing: Yes ue to Product Use: No lough/Fibrino Yes Wound Bed Granulation Amount: Large (67-100%) Exposed Structure Granulation Quality: Red Fascia Exposed: No Necrotic Amount: Small (1-33%) Fat Layer (Subcutaneous Tissue) Exposed: Yes Necrotic Quality: Adherent Slough Tendon Exposed: No Muscle Exposed: No Joint Exposed: No Bone Exposed: No Electronic Signature(s) Signed: 08/13/2019 4:33:06 PM By: Tresa Moore, Shakemia ([id number]) Entered By: Curtis Sites on 08/13/2019 14:24:03 Franklyn, Linard Millers ([id number]) -------------------------------------------------------------------------------- Vitals Details Patient Name: Alice Wallace Date of Service: 08/13/2019 1:45 PM Medical Record Number: [id number] Patient Account Number: [id number] Date of Birth/Sex: 05-14-1941 (79 y.o. F) Treating RN: Curtis Sites Primary Care Alivia Cimino: Devoria Glassing Other Clinician: Referring Maylea Soria: Devoria Glassing Treating Micheal Sheen/Extender: Altamese [place] in Treatment: 7 Vital Signs Time Taken: 14:16 Temperature (F): 99.6 Height (in): 64 Pulse (bpm): 93 Weight (lbs): 120 Respiratory Rate (breaths/min): 16 Body Mass Index (BMI): 20.6 Blood Pressure (mmHg): 120/54 Reference Range: 80 - 120 mg / dl Electronic Signature(s) Signed: 08/13/2019 4:33:06 PM By: Curtis Sites Entered By: Curtis Sites on 08/13/2019 14:17:30

## 2019-09-10 ENCOUNTER — Encounter: Payer: Medicare Other | Admitting: Internal Medicine

## 2019-09-10 ENCOUNTER — Other Ambulatory Visit: Payer: Self-pay

## 2019-09-10 DIAGNOSIS — L89214 Pressure ulcer of right hip, stage 4: Secondary | ICD-10-CM | POA: Diagnosis not present

## 2019-09-11 NOTE — Progress Notes (Signed)
CRESTA, RIDEN ([id number]) Visit Report for 09/10/2019 HPI Details Patient Name: Alice Wallace, Alice Wallace Date of Service: 09/10/2019 11:00 AM Medical Record Number: [id number] Patient Account Number: [id number] Date of Birth/Sex: 12-Apr-1941 (79 y.o. F) Treating RN: Huel Coventry Primary Care Provider: Devoria Glassing Other Clinician: Referring Provider: Devoria Glassing Treating Provider/Extender: Altamese Sidell in Treatment: 11 History of Present Illness HPI Description: ADMISSION 06/25/2019 This is a 79 year old woman who comes from the [facility] facility to [place] today for review of a pressure ulcer on her left hip. I really do not know much about this and I am not exactly certain how long this is been present. The patient was hospitalized from 1/16 through 1/19 at [facility] with Covid pneumonia all ended she is signed out is having a pressure ulcer on the left ischial tuberosity although there is very little information on this. She comes with an attendant from the facility. 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 Past medical history includes interstitial lung disease, L1 compression fracture, deafness, COPD, Covid pneumonia as described. Socially she has a DSS Child psychotherapist we were not able to contact her before doing this review. My interaction with the patient and allowing her to read my lips I felt that the patient understood what I was saying about her wounds and the need for debridement. I therefore went ahead and treated these aggressively in the clinic. 2/10; patient came in with a necrotic wound on her left greater trochanter last week. Extensive debridement we use Santyl. The wound is cleaned up quite nicely but goes down to periosteum. Lab work I ordered last week showed a white count of 9.3 with a normal differential hemoglobin 8.9. CMP was normal other than an albumin of 3. Her sedimentation rate was 24 and C-reactive protein 19. These are not remarkably high. We  could not get an x-ray of the left greater trochanter done in the facility for some reason so I will order this to be done in the [facility] facility so I can see it online. The patient does not complain of pain 2/17; deep pressure area on the left greater trochanter. The x-ray we obtained at [facility] showed no specific radiographic evidence of osteomyelitis. Unfortunately the patient fell and fractured her left arm she has a cast on her arm 2/24; deep pressure area on the left greater trochanter. This is down to periosteum in this area and probes superiorly. There is some tissue here. We have a wound VAC and I think it is time to apply it. We will see how the wound reacts to this. There was a slight odor to the wound bed and I did a culture today but no empiric antibiotics. The patient is in an [facility] 3/3; deep pressure ulcer over the left greater trochanter. We put a wound VAC on this last week. She comes in today with a much improved wound. The wound is contracted and I think the undermining area is filled then. I could not easily detect any palpable bone. Culture from last time grew Pseudomonas and methicillin resistant staph aureus [MRSA]. This is never an easy combination. I gave her a combination of Cipro and doxycycline as she was allergic to Bactrim. Both of these will be for 7 days. 3/10; patient is completing her antibiotics. She does not appear to require any additional antibiotics. Wound orifice continues to contract there is a lot of this that is granulated however significant tunneling from  9-3 o'clock 3/24; 2-week follow-up. The patient completed her antibiotics. We are using silver collagen under the [facility] 4/7; 2-week follow-up. The patient has a smaller wound although that she still has the tunneling area from 10:00. This is certainly come down in size although there is still depth and undermining we are using collagen under the wound VAC. oShe  has painful area just below her antecubital fossa on the left which was under her cast. I think this is a small thrombosed superficial vein. I do not think this needs specific treatment 4/21; 2-week follow-up. The patient has a smaller wound by quite a bit. She still has a tunneling area at 3:00 which you can fully expose the depth of by pulling the tissue superiorly. Electronic Signature(s) Signed: 09/11/2019 7:42:01 AM By: Linton Ham MD Entered By: Linton Ham on 09/10/2019 12:22:17 Alice Wallace ([id number]) -------------------------------------------------------------------------------- Physical Exam Details Patient Name: Alice Wallace Date of Service: 09/10/2019 11:00 AM Medical Record Number: [id number] Patient Account Number: [id number] Date of Birth/Sex: 03/04/1941 (79 y.o. F) Treating RN: Cornell Barman Primary Care Provider: Odessa Fleming Other Clinician: Referring Provider: Odessa Fleming Treating Provider/Extender: Tito Dine in Treatment: 11 Constitutional Sitting or standing Blood Pressure is within target range for patient.. Pulse regular and within target range for patient.Marland Kitchen Respirations regular, non- labored and within target range.. Temperature is normal and within the target range for the patient.Marland Kitchen appears in no distress. Respiratory Respiratory effort is easy and symmetric bilaterally. Rate is normal at rest and on room air.. Integumentary (Hair, Skin) She has what appears to be a candidal infection around the wound area. Psychiatric No evidence of depression, anxiety, or agitation. Calm, cooperative, and communicative. Appropriate interactions and affect.. Notes Wound exam; much of the wound looks quite a bit better and smaller. However she has a tunneling area at 3:00. You can expose the full extent of this and there is significantly more depth than the superficial wound you can see. We need to make sure that this area gets packed. Electronic  Signature(s) Signed: 09/11/2019 7:42:01 AM By: Linton Ham MD Entered By: Linton Ham on 09/10/2019 12:23:32 Alice Wallace ([id number]) -------------------------------------------------------------------------------- Physician Orders Details Patient Name: Alice Wallace Date of Service: 09/10/2019 11:00 AM Medical Record Number: [id number] Patient Account Number: [id number] Date of Birth/Sex: [birth year] (79 y.o. F) Treating RN: Cornell Barman Primary Care Provider: Odessa Fleming Other Clinician: Referring Provider: Odessa Fleming Treating Provider/Extender: Tito Dine in Treatment: 11 Verbal / Phone Orders: No Diagnosis Coding Wound Cleansing Wound #1 Left Trochanter o Clean wound with Normal Saline. Anesthetic (add to Medication List) Wound #1 Left Trochanter o Topical Lidocaine 4% cream applied to wound bed prior to debridement (In Clinic Only). Skin Barriers/Peri-Wound Care Wound #1 Left Trochanter o Antifungal cream - on reddened peri-wound areas Primary Wound Dressing Wound #1 Left Trochanter o Silver Collagen - packed lightly into wound from 9-12 (3cm deep) moisten with hydrogel Secondary Dressing Wound #1 Left Trochanter o Boardered Foam Dressing Dressing Change Frequency Wound #1 Left Trochanter o Change Dressing Monday, Wednesday, Friday Follow-up Appointments Wound #1 Left Trochanter o Return Appointment in 2 weeks. Off-Loading Wound #1 Left Trochanter o Turn and reposition every 2 hours o Other: - no pressure on wounded area Additional Orders / Instructions Wound #1 Left Trochanter o Increase protein intake. o Activity as tolerated Home Health Wound #1 Left [place] Visits - [facility]: o [place] Nurse may visit PRN to address patientos wound care needs. o FACE TO  FACE ENCOUNTER: MEDICARE and MEDICAID PATIENTS: I certify that this patient is under my care and that I had a face-to- face  encounter that meets the physician face-to-face encounter requirements with this patient on this date. The encounter with the patient was in whole or in part for the following MEDICAL CONDITION: (primary reason for Home Healthcare) MEDICAL NECESSITY: I certify, that based on my findings, NURSING services are a medically necessary home health service. HOME BOUND STATUS: I certify that my clinical findings support that this patient is homebound (i.e., Due to illness or injury, pt requires aid of supportive devices such as crutches, cane, wheelchairs, walkers, the use of special transportation or the assistance of another person to leave their place of residence. There is a normal inability to leave the home and doing so requires considerable and taxing effort. Other absences are for medical reasons / religious services and are infrequent or of short duration when for other reasons). MELBA, ARAKI ([id number]) o If current dressing causes regression in wound condition, may D/C ordered dressing product/s and apply Normal Saline Moist Dressing daily until next Wound Healing Center / Other MD appointment. Notify Wound Healing Center of regression in wound condition at [phone]. o Please direct any NON-WOUND related issues/requests for orders to patient's Primary Care Physician Negative Pressure Wound Therapy Wound #1 Left Trochanter o Place NPWT on HOLD. - Hold for 2 weeks Medications-please add to medication list. Wound #1 Left Trochanter o Other: - Antifungal cream Electronic Signature(s) Signed: 09/11/2019 7:42:01 AM By: Baltazar Najjar MD Signed: 09/11/2019 5:10:21 PM By: Elliot Gurney, BSN, RN, CWS, Kim RN, BSN Entered By: Elliot Gurney, BSN, RN, CWS, Kim on 09/10/2019 11:32:01 Alice Wallace ([id number]) -------------------------------------------------------------------------------- Problem List Details Patient Name: Alice Wallace Date of Service: 09/10/2019 11:00 AM Medical Record Number:  [id number] Patient Account Number: [id number] Date of Birth/Sex: August 13, 1940 (79 y.o. 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 F) Treating RN: Huel Coventry Primary Care Provider: Devoria Glassing Other Clinician: Referring Provider: Devoria Glassing Treating Provider/Extender: Altamese [place] in Treatment: 11 Subjective History of Present Illness (HPI) ADMISSION 06/25/2019 This is a 79 year old woman who comes from the [facility] skilled facility to [place] today for review of a pressure ulcer on her left hip. I really do not know much about this and I am not exactly certain how long this is been present. The patient was hospitalized from 1/16 through 1/19 at [facility] with Covid pneumonia all ended she is signed out is having a pressure ulcer on the left ischial tuberosity although there is very little information on this. She comes with an attendant from the facility. 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 Past medical history includes interstitial lung disease, L1 compression fracture, deafness, COPD, Covid pneumonia as described. Socially she has a DSS Child psychotherapist we  were not able to contact her before doing this review. My interaction with the patient and  allowing her to read my lips I felt that the patient understood what I was saying about her wounds and the need for debridement. I therefore went ahead and treated these aggressively in the clinic. 2/10; patient came in with a necrotic wound on her left greater trochanter last week. Extensive debridement we use Santyl. The wound is cleaned up quite nicely but goes down to periosteum. Lab work I ordered last week showed a white count of 9.3 with a normal differential hemoglobin 8.9. CMP was normal other than an albumin of 3. Her sedimentation rate was 24 and C-reactive protein 19. These are not remarkably high. We could not get an x-ray of the left greater trochanter done in the facility for some reason so I will order this to be [facility] so I can see it online. The patient does not complain of pain 2/17; deep pressure area on the left greater trochanter. [facility] showed no specific radiographic evidence of osteomyelitis. Unfortunately the patient fell and fractured her left arm she has a cast on her arm 2/24; deep pressure area on the left greater trochanter. This is down to periosteum in this area and probes superiorly. There is some tissue here. We have a wound VAC and I think it is time to apply it. We will see how the wound reacts to this. There was a slight odor to the wound bed and I did a culture today but no empiric antibiotics. The patient is in an [facility] living [facility] 3/3; deep pressure ulcer over the left greater trochanter. We put a wound VAC on this last week. She comes in today with a much improved wound. The wound is contracted and I think the undermining area is filled then. I could not easily detect any palpable bone. Culture from last time grew Pseudomonas and methicillin resistant staph aureus [MRSA]. This is never an easy  combination. I gave her a combination of Cipro and doxycycline as she was allergic to Bactrim. Both of these will be for 7 days. 3/10; patient is completing her antibiotics. She does not appear to require any additional antibiotics. Wound orifice continues to contract there is a lot of this that is granulated however significant tunneling from 9-3 o'clock 3/24; 2-week follow-up. The patient completed her antibiotics. [facility]; 2-week follow-up. The patient has a smaller wound although that she still has the tunneling area from 10:00. This is certainly come down in size although there is still depth and undermining we are using collagen under the wound VAC. She has painful area just below her antecubital fossa on the left which was under her cast. I think this is a small thrombosed superficial vein. I do not think this needs specific treatment 4/21; 2-week follow-up. The patient has a smaller wound by quite a bit. She still has a tunneling area at 3:00 which you can fully expose the depth of by pulling the tissue superiorly. Objective Constitutional Sitting or standing Blood Pressure is within target range for patient.. Pulse regular and within target range for patient.Marland Kitchen. Respirations regular, non- labored and within target range.. Temperature is normal and within the target range for the patient.Marland Kitchen. appears in no distress. Vitals Time Taken: 11:14 AM, Height: 64 in, Weight: 120 lbs, BMI: 20.6, Temperature: 98.5 F, Pulse: 75 bpm, Respiratory Rate: 20 breaths/min, Blood Pressure: 116/44 mmHg. Respiratory Alice Wallace, Alice Wallace ([id number]) Respiratory effort is easy and symmetric  bilaterally. Rate is normal at rest and on room air.Marland Kitchen Psychiatric No evidence of depression, anxiety, or agitation. Calm, cooperative, and communicative. Appropriate interactions and affect.. General Notes: Wound exam; much of the wound looks quite a bit better and smaller. However she has a  tunneling area at 3:00. You can expose the full extent of this and there is significantly more depth than the superficial wound you can see. We need to make sure that this area gets packed. Integumentary (Hair, Skin) She has what appears to be a candidal infection around the wound area. Wound #1 status is Open. Original cause of wound was Pressure Injury. The wound is located on the Left Trochanter. The wound measures 1.4cm length x 1.8cm width x 0.2cm depth; 1.979cm^2 area and 0.396cm^3 volume. There is Fat Layer (Subcutaneous Tissue) Exposed exposed. There is no tunneling noted, however, there is undermining starting at 9:00 and ending at 12:00 with a maximum distance of 3cm. There is a medium amount of serous drainage noted. Foul odor after cleansing was noted. The wound margin is flat and intact. There is large (67-100%) red granulation within the wound bed. There is a small (1-33%) amount of necrotic tissue within the wound bed including Adherent Slough. Assessment Active Problems ICD-10 Pressure ulcer of right hip, stage 4 Personal history of COVID-19 Plan Wound Cleansing: Wound #1 Left Trochanter: Clean wound with Normal Saline. Anesthetic (add to Medication List): Wound #1 Left Trochanter: Topical Lidocaine 4% cream applied to wound bed prior to debridement (In Clinic Only). Skin Barriers/Peri-Wound Care: Wound #1 Left Trochanter: Antifungal cream - on reddened peri-wound areas Primary Wound Dressing: Wound #1 Left Trochanter: Silver Collagen - packed lightly into wound from 9-12 (3cm deep) moisten with hydrogel Secondary Dressing: Wound #1 Left Trochanter: Boardered Foam Dressing Dressing Change Frequency: Wound #1 Left Trochanter: Change Dressing Monday, Wednesday, Friday Follow-up Appointments: Wound #1 Left Trochanter: Return Appointment in 2 weeks. Off-Loading: Wound #1 Left Trochanter: Turn and reposition every 2 hours Other: - no pressure on wounded  area Additional Orders / Instructions: Wound #1 Left Trochanter: Increase protein intake. Activity as tolerated Home Health: Wound #1 Left Trochanter: Continue Home Health Visits - [facility]: Home Health Nurse may visit PRN to address patient s wound care needs. FACE TO FACE ENCOUNTER: MEDICARE and MEDICAID PATIENTS: I certify that this patient is under my care and that I had a face-to-face Alice Wallace, Alice Wallace ([id number]) encounter that meets the physician face-to-face encounter requirements with this patient on this date. The encounter with the patient was in whole or in part for the following MEDICAL CONDITION: (primary reason for Home Healthcare) MEDICAL NECESSITY: I certify, that based on my findings, NURSING services are a medically necessary home health service. HOME BOUND STATUS: I certify that my clinical findings support that this patient is homebound (i.e., Due to illness or injury, pt requires aid of supportive devices such as crutches, cane, wheelchairs, walkers, the use of special transportation or the assistance of another person to leave their place of residence. There is a normal inability to leave the home and doing so requires considerable and taxing effort. Other absences are for medical reasons / religious services and are infrequent or of short duration when for other reasons). If current dressing causes regression in wound condition, may D/C ordered dressing product/s and apply Normal Saline Moist Dressing daily until next Wound Healing Center / Other MD appointment. Notify Wound Healing Center of regression in wound condition at [phone]. Please direct any NON-WOUND related issues/requests for orders  to patient's Primary Care Physician Negative Pressure Wound Therapy: Wound #1 Left Trochanter: Place NPWT on HOLD. - Hold for 2 weeks Medications-please add to medication list.: Wound #1 Left Trochanter: Other: - Antifungal cream 1. Nystatin cream to the likely candidal  dermatitis which was under the wound VAC drape 2. I am going to put the wound VAC on hold and see if we can dress the wound properly with silver collagen and hydrogel. We are changing this 3 times a week 3. The big problem is the area at 3:00 which we identified is a tunnel. Removing the tissue actually exposes of the depth of this which is much deeper than what appears superficially but does not have palpable bone. We tried to emphasize this to the facility staff doing the dressing Electronic Signature(s) Signed: 09/11/2019 7:42:01 AM By: Baltazar Najjar MD Entered By: Baltazar Najjar on 09/10/2019 12:24:54 Alice Wallace ([id number]) -------------------------------------------------------------------------------- SuperBill Details Patient Name: Alice Wallace Date of Service: 09/10/2019 Medical Record Number: [id number] Patient Account Number: [id number] Date of Birth/Sex: 09-30-1940 (79 y.o. 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

## 2019-09-11 NOTE — Progress Notes (Signed)
BELANNA, MANRING ([id number]) Visit Report for 09/10/2019 Arrival Information Details Patient Name: Alice Wallace, Alice Wallace Date of Service: 09/10/2019 11:00 AM Medical Record Number: [id number] Patient Account Number: [id number] Date of Birth/Sex: 08/14/1940 (79 y.o. F) Treating RN: Curtis Sites Primary Care Olivier Frayre: Devoria Glassing Other Clinician: Referring Iyad Deroo: Devoria Glassing Treating Aviyana Sonntag/Extender: Altamese [place] in Treatment: 11 Visit Information History Since Last Visit Added or deleted any medications: No Patient Arrived: Wheel Chair Any new allergies or adverse reactions: No Arrival Time: 11:14 Had a fall or experienced change in No Accompanied By: caregiver activities of daily living that may affect Transfer Assistance: None risk of falls: Patient Identification Verified: Yes Signs or symptoms of abuse/neglect since last visito No Secondary Verification Process Completed: Yes Hospitalized since last visit: No Implantable device outside of the clinic excluding No cellular tissue based products placed in the center since last visit: Has Dressing in Place as Prescribed: Yes Pain Present Now: No Electronic Signature(s) Signed: 09/10/2019 4:33:58 PM By: Curtis Sites Entered By: Curtis Sites on 09/10/2019 11:14:36 Royce, Savanha ([id number]) -------------------------------------------------------------------------------- Clinic Level of Care Assessment Details Patient Name: Alice Wallace Date of Service: 09/10/2019 11:00 AM Medical Record Number: [id number] Patient Account Number: [id number] Date of Birth/Sex: [birth year] (78 y.o. F) Treating RN: Huel Coventry Primary Care Riven Beebe: Devoria Glassing Other Clinician: Referring Kierre Hintz: Devoria Glassing Treating Reanne Nellums/Extender: Altamese [place] in Treatment: 11 Clinic Level of Care Assessment Items TOOL 4 Quantity Score []  - Use when only an EandM is performed on FOLLOW-UP visit 0 ASSESSMENTS - Nursing  Assessment / Reassessment X - Reassessment of Co-morbidities (includes updates in patient status) 1 10 X- 1 5 Reassessment of Adherence to Treatment Plan ASSESSMENTS - Wound and Skin Assessment / Reassessment X - Simple Wound Assessment / Reassessment - one wound 1 5 []  - 0 Complex Wound Assessment / Reassessment - multiple wounds X- 1 10 Dermatologic / Skin Assessment (not related to wound area) ASSESSMENTS - Focused Assessment []  - Circumferential Edema Measurements - multi extremities 0 []  - 0 Nutritional Assessment / Counseling / Intervention []  - 0 Lower Extremity Assessment (monofilament, tuning fork, pulses) []  - 0 Peripheral Arterial Disease Assessment (using hand held doppler) ASSESSMENTS - Ostomy and/or Continence Assessment and Care []  - Incontinence Assessment and Management 0 []  - 0 Ostomy Care Assessment and Management (repouching, etc.) PROCESS - Coordination of Care X - Simple Patient / Family Education for ongoing care 1 15 []  - 0 Complex (extensive) Patient / Family Education for ongoing care X- 1 10 Staff obtains , Records, Test Results / Process Orders []  - 0 Staff telephones HHA, Nursing Homes / Clarify orders / etc []  - 0 Routine Transfer to another Facility (non-emergent condition) []  - 0 Routine Hospital Admission (non-emergent condition) []  - 0 New Admissions / / Ordering NPWT, Apligraf, etc. []  - 0 Emergency Hospital Admission (emergent condition) X- 1 10 Simple Discharge Coordination []  - 0 Complex (extensive) Discharge Coordination PROCESS - Special Needs []  - Pediatric / Minor Patient Management 0 []  - 0 Isolation Patient Management []  - 0 Hearing / Language / Visual special needs []  - 0 Assessment of Community assistance (transportation, D/C planning, etc.) Shorten, Elfida ( ) []  - 0 Additional assistance / Altered mentation []  - 0 Support Surface(s) Assessment (bed, cushion, seat,  etc.) INTERVENTIONS - Wound Cleansing / Measurement X - Simple Wound Cleansing - one wound 1 5 []  - 0 Complex Wound Cleansing - multiple wounds X- 1 5 Wound Imaging (photographs - any number  of wounds) []  - 0 Wound Tracing (instead of photographs) X- 1 5 Simple Wound Measurement - one wound []  - 0 Complex Wound Measurement - multiple wounds INTERVENTIONS - Wound Dressings []  - Small Wound Dressing one or multiple wounds 0 X- 1 15 Medium Wound Dressing one or multiple wounds []  - 0 Large Wound Dressing one or multiple wounds X- 1 5 Application of Medications - topical []  - 0 Application of Medications - injection INTERVENTIONS - Miscellaneous []  - External ear exam 0 []  - 0 Specimen Collection (cultures, biopsies, blood, body fluids, etc.) []  - 0 Specimen(s) / Culture(s) sent or taken to Lab for analysis []  - 0 Patient Transfer (multiple staff / / Similar devices) []  - 0 Simple Staple / Suture removal (25 or less) []  - 0 Complex Staple / Suture removal (26 or more) []  - 0 Hypo / Hyperglycemic Management (close monitor of Blood Glucose) []  - 0 Ankle / Brachial Index (ABI) - do not check if billed separately X- 1 5 Vital Signs Has the patient been seen at the hospital within the last three years: Yes Total Score: 105 Level Of Care: New/Established - Level 3 Electronic Signature(s) Signed: 09/11/2019 5:10:21 PM By: , BSN, RN, CWS, Kim RN, BSN Entered By: , BSN, RN, CWS, Kim on 09/10/2019 11:32:54 ( ) -------------------------------------------------------------------------------- Encounter Discharge Information Details Patient Name: Date of Service: 09/10/2019 11:00 AM Medical Record Number: Nurse, adult Patient Account Number: Date of Birth/Sex: 09/15/1940 (78 y.o. F) Treating RN: Primary Care Kurtis Anastasia: 09/13/2019 Other Clinician: Referring Akshat Minehart: Elliot Gurney Treating  Fain Francis/Extender: Elliot Gurney in Treatment: 11 Encounter Discharge Information Items Discharge Condition: Stable Ambulatory Status: Walker Discharge Destination: Home Transportation: Private Auto Accompanied By: self Schedule Follow-up Appointment: Yes Clinical Summary of Care: Electronic Signature(s) Signed: 09/11/2019 5:10:21 PM By: Alice Wallace, BSN, RN, CWS, Kim RN, BSN Entered By: [id number], BSN, RN, CWS, Kim on 09/10/2019 11:34:01 09/12/2019 ([id number]) -------------------------------------------------------------------------------- Lower Extremity Assessment Details Patient Name: [id number] Date of Service: 09/10/2019 11:00 AM Medical Record Number: [id number] Patient Account Number: Huel Coventry Date of Birth/Sex: May 13, 1941 (78 y.o. F) Treating RN: Altamese [place] Primary Care Rennae Ferraiolo: 09/13/2019 Other Clinician: Referring Laura-Lee Villegas: Elliot Gurney Treating Toneka Fullen/Extender: Elliot Gurney in Treatment: 11 Electronic Signature(s) Signed: 09/10/2019 4:33:58 PM By: Alice Wallace Entered By: [id number] on 09/10/2019 11:15:17 [place]rake, Brigette (09/12/2019) -------------------------------------------------------------------------------- Multi Wound Chart Details Patient Name: [id number] Date of Service: 09/10/2019 11:00 AM Medical Record Number: 12/26/1940 Patient Account Number: [id number] Date of Birth/Sex: 1940-08-02 (79 y.o. F) Treating RN: Devoria Glassing Primary Care Gerardine Peltz: Altamese [place] Other Clinician: Referring Beverely Suen: 09/12/2019 Treating Martice Doty/Extender: Curtis Sites in Treatment: 11 Vital Signs Height(in): 64 Pulse(bpm): 75 Weight(lbs): 120 Blood Pressure(mmHg): 116/44 Body Mass Index(BMI): 21 Temperature(F): 98.5 Respiratory Rate(breaths/min): 20 Photos: [N/A:N/A] Wound Location: Left Trochanter N/A N/A Wounding Event: Pressure Injury N/A N/A Primary Etiology: Pressure Ulcer N/A N/A Comorbid History: Anemia,  Chronic Obstructive N/A N/A Pulmonary Disease (COPD), Arrhythmia, Hypertension Date Acquired: 04/22/2019 N/A N/A Weeks of Treatment: 11 N/A N/A Wound Status: Open N/A N/A Measurements L x W x D (cm) 1.4x1.8x0.2 N/A N/A Area (cm) : 1.979 N/A N/A Volume (cm) : 0.396 N/A N/A % Reduction in Area: 83.80% N/A N/A % Reduction in Volume: 97.70% N/A N/A Starting Position 1 (o'clock): 9 Ending Position 1 (o'clock): 12 Maximum Distance 1 (cm): 3 Undermining: Yes N/A N/A Classification: Category/Stage III N/A N/A Exudate Amount: Medium N/A N/A Exudate  Type: Serous N/A N/A Exudate Color: amber N/A N/A Foul Odor After Cleansing: Yes N/A N/A Odor Anticipated Due to Product No N/A N/A Use: Wound Margin: Flat and Intact N/A N/A Granulation Amount: Large (67-100%) N/A N/A Granulation Quality: Red N/A N/A Necrotic Amount: Small (1-33%) N/A N/A Exposed Structures: Fat Layer (Subcutaneous Tissue) N/A N/A Exposed: Yes Fascia: No Tendon: No Muscle: No Joint: No Bone: No Epithelialization: None N/A N/A MITTMAN, Briceida ([id number]) Treatment Notes Electronic Signature(s) Signed: 09/11/2019 5:10:21 PM By: Elliot Gurney, BSN, RN, CWS, Kim RN, BSN Entered By: Elliot Gurney, BSN, RN, CWS, Kim on 09/10/2019 11:28:16 Alice Wallace ([id number]) -------------------------------------------------------------------------------- Multi-Disciplinary Care Plan Details Patient Name: Alice Wallace Date of Service: 09/10/2019 11:00 AM Medical Record Number: [id number] Patient Account Number: [id number] Date of Birth/Sex: June 03, 1940 (78 y.o. F) Treating RN: Huel Coventry Primary Care Sricharan Lacomb: Devoria Glassing Other Clinician: Referring Anitha Kreiser: Devoria Glassing Treating Teshara Moree/Extender: Altamese [place] in Treatment: 11 Active Inactive Medication Nursing Diagnoses: Knowledge deficit related to medication safety: actual or potential Goals: Patient/caregiver will demonstrate understanding of all current medications Date  Initiated: 07/02/2019 Target Resolution Date: 07/31/2019 Goal Status: Active Interventions: Assess for medication contraindications each visit where new medications are prescribed Notes: Orientation to the Wound Care Program Nursing Diagnoses: Knowledge deficit related to the wound healing center program Goals: Patient/caregiver will verbalize understanding of the Wound Healing Center Program Date Initiated: 07/02/2019 Target Resolution Date: 07/23/2019 Goal Status: Active Interventions: Provide education on orientation to the wound center Notes: Pressure Nursing Diagnoses: Knowledge deficit related to management of pressures ulcers Goals: Patient will remain free of pressure ulcers Date Initiated: 07/02/2019 Target Resolution Date: 07/16/2019 Goal Status: Active Interventions: Provide education on pressure ulcers Notes: Wound/Skin Impairment Nursing Diagnoses: Impaired tissue integrity Knowledge deficit related to smoking impact on wound healing Harned, Kaytlan ([id number]) Goals: Patient/caregiver will verbalize understanding of skin care regimen Date Initiated: 07/02/2019 Target Resolution Date: 07/23/2019 Goal Status: Active Ulcer/skin breakdown will have a volume reduction of 30% by week 4 Date Initiated: 07/02/2019 Target Resolution Date: 07/30/2019 Goal Status: Active Interventions: Assess ulceration(s) every visit Provide education on ulcer and skin care Notes: Electronic Signature(s) Signed: 09/11/2019 5:10:21 PM By: Elliot Gurney, BSN, RN, CWS, Kim RN, BSN Entered By: Elliot Gurney, BSN, RN, CWS, Kim on 09/10/2019 11:28:05 Alice Wallace ([id number]) -------------------------------------------------------------------------------- Pain Assessment Details Patient Name: Alice Wallace Date of Service: 09/10/2019 11:00 AM Medical Record Number: [id number] Patient Account Number: [id number] Date of Birth/Sex: [birth year] (78 y.o. F) Treating RN: Curtis Sites Primary Care Stellah Donovan: Devoria Glassing Other Clinician: Referring Rodell Marrs: Devoria Glassing Treating Maveric Debono/Extender: Altamese [place] in Treatment: 11 Active Problems Location of Pain Severity and Description of Pain Patient Has Paino No Site Locations Pain Management and Medication Current Pain Management: Electronic Signature(s) Signed: 09/10/2019 4:33:58 PM By: Curtis Sites Entered By: Curtis Sites on 09/10/2019 11:15:09 Alice Wallace ([id number]) -------------------------------------------------------------------------------- Patient/Caregiver Education Details Patient Name: Alice Wallace Date of Service: 09/10/2019 11:00 AM Medical Record Number: [id number] Patient Account Number: [id number] Date of Birth/Gender: April 27, 1941 (79 y.o. F) Treating RN: Huel Coventry Primary Care Physician: Devoria Glassing Other Clinician: Referring Physician: Devoria Glassing Treating Physician/Extender: Altamese [place] in Treatment: 11 Education Assessment Education Provided To: Caregiver sent to SNF Education Topics Provided Wound/Skin Impairment: Handouts: Caring for Your Ulcer, Other: Continue wound care as prescribed Methods: Demonstration Responses: State content correctly Electronic Signature(s) Signed: 09/11/2019 5:10:21 PM By: Elliot Gurney, BSN, RN, CWS, Kim RN, BSN Entered By: Elliot Gurney, BSN, RN, CWS, Kim on 09/10/2019 11:33:30 Downen, Shahla ([id number]) -------------------------------------------------------------------------------- Wound Assessment  Details Patient Name: AILI, CASILLAS Date of Service: 09/10/2019 11:00 AM Medical Record Number: [id number] Patient Account Number: [id number] Date of Birth/Sex: [birth year] (79 y.o. F) Treating RN: Montey Hora Primary Care Welma Mccombs: Odessa Fleming Other Clinician: Referring Marco Raper: Odessa Fleming Treating Tabita Corbo/Extender: Tito Dine in Treatment: 11 Wound Status Wound Number: 1 Primary Pressure Ulcer Etiology: Wound Location: Left  Trochanter Wound Status: Open Wounding Event: Pressure Injury Comorbid Anemia, Chronic Obstructive Pulmonary Disease (COPD), Date Acquired: 04/22/2019 History: Arrhythmia, Hypertension Weeks Of Treatment: 11 Clustered Wound: No Photos Wound Measurements Length: (cm) 1.4 % Width: (cm) 1.8 % Depth: (cm) 0.2 Ep Area: (cm) 1.979 T Volume: (cm) 0.396 U Reduction in Area: 83.8% Reduction in Volume: 97.7% ithelialization: None unneling: No ndermining: Yes Starting Position (o'clock): 9 Ending Position (o'clock): 12 Maximum Distance: (cm) 3 Wound Description Classification: Category/Stage III F Wound Margin: Flat and Intact D Exudate Amount: Medium S Exudate Type: Serous Exudate Color: amber oul Odor After Cleansing: Yes ue to Product Use: No lough/Fibrino Yes Wound Bed Granulation Amount: Large (67-100%) Exposed Structure Granulation Quality: Red Fascia Exposed: No Necrotic Amount: Small (1-33%) Fat Layer (Subcutaneous Tissue) Exposed: Yes Necrotic Quality: Adherent Slough Tendon Exposed: No Muscle Exposed: No Joint Exposed: No Bone Exposed: No Treatment Notes Wound #1 (Left Trochanter) Notes LINAREZ, Kassey ([id number]) [place] secured with bordered foam dressing Electronic Signature(s) Signed: 09/10/2019 4:33:58 PM By: Montey Hora Signed: 09/11/2019 5:10:21 PM By: Gretta Cool, BSN, RN, CWS, Kim RN, BSN Entered By: Gretta Cool, BSN, RN, CWS, Kim on 09/10/2019 11:27:56 Duffin, Hinda Lenis ([id number]) -------------------------------------------------------------------------------- [place] Details Patient Name: [name] Date of Service: 09/10/2019 11:00 AM Medical Record Number: [id number] Patient Account Number: [id number] Date of Birth/Sex: Sep 15, 1940 (79 y.o. F) Treating RN: Montey Hora Primary Care Taige Housman: Odessa Fleming Other Clinician: Referring Meredyth Hornung: Odessa Fleming Treating Jessalyn Hinojosa/Extender: Tito Dine in Treatment: 11 Vital Signs Time Taken:  11:14 Temperature (F): 98.5 Height (in): 64 Pulse (bpm): 75 Weight (lbs): 120 Respiratory Rate (breaths/min): 20 Body Mass Index (BMI): 20.6 Blood Pressure (mmHg): 116/44 Reference Range: 80 - 120 mg / dl Electronic Signature(s) Signed: 09/10/2019 4:33:58 PM By: Montey Hora Entered By: Montey Hora on 09/10/2019 11:15:02

## 2019-09-24 ENCOUNTER — Encounter: Payer: Medicare Other | Attending: Internal Medicine | Admitting: Internal Medicine

## 2019-09-24 ENCOUNTER — Other Ambulatory Visit: Payer: Self-pay

## 2019-09-24 DIAGNOSIS — J449 Chronic obstructive pulmonary disease, unspecified: Secondary | ICD-10-CM | POA: Insufficient documentation

## 2019-09-24 DIAGNOSIS — L89214 Pressure ulcer of right hip, stage 4: Secondary | ICD-10-CM | POA: Diagnosis not present

## 2019-09-24 DIAGNOSIS — Z8616 Personal history of COVID-19: Secondary | ICD-10-CM | POA: Diagnosis not present

## 2019-09-24 DIAGNOSIS — H9190 Unspecified hearing loss, unspecified ear: Secondary | ICD-10-CM | POA: Insufficient documentation

## 2019-09-24 DIAGNOSIS — J849 Interstitial pulmonary disease, unspecified: Secondary | ICD-10-CM | POA: Insufficient documentation

## 2019-09-24 DIAGNOSIS — I10 Essential (primary) hypertension: Secondary | ICD-10-CM | POA: Insufficient documentation

## 2019-09-25 NOTE — Progress Notes (Signed)
Alice Wallace, Alice Wallace ([id number]) Visit Report for 09/24/2019 Arrival Information Details Patient Name: Alice Wallace, Alice Wallace Date of Service: 09/24/2019 9:30 AM Medical Record Number: [id number] Patient Account Number: [id number] Date of Birth/Sex: 01-02-1941 (79 y.o. F) Treating RN: Curtis Sites Primary Care Delos Klich: Devoria Glassing Other Clinician: Referring Amaria Mundorf: Devoria Glassing Treating Roma Bierlein/Extender: Altamese Randall in Treatment: 13 Visit Information History Since Last Visit Added or deleted any medications: No Patient Arrived: Walker Any new allergies or adverse reactions: No Arrival Time: 09:30 Had a fall or experienced change in No Accompanied By: caregiver activities of daily living that may affect Transfer Assistance: None risk of falls: Patient Identification Verified: Yes Signs or symptoms of abuse/neglect since last visito No Secondary Verification Process Completed: Yes Hospitalized since last visit: No Implantable device outside of the clinic excluding No cellular tissue based products placed in the center since last visit: Has Dressing in Place as Prescribed: Yes Pain Present Now: No Electronic Signature(s) Signed: 09/24/2019 4:27:51 PM By: Curtis Sites Entered By: Curtis Sites on 09/24/2019 09:31:04 Alice Wallace, Alice Wallace ([id number]) -------------------------------------------------------------------------------- Encounter Discharge Information Details Patient Name: Alice Wallace Date of Service: 09/24/2019 9:30 AM Medical Record Number: [id number] Patient Account Number: [id number] Date of Birth/Sex: Nov 16, 1940 (79 y.o. F) Treating RN: Huel Coventry Primary Care Kayveon Lennartz: Devoria Glassing Other Clinician: Referring Maleta Pacha: Devoria Glassing Treating Chealsey Miyamoto/Extender: Altamese Galloway in Treatment: 13 Encounter Discharge Information Items Discharge Condition: Stable Ambulatory Status: Ambulatory Discharge Destination: Home Transportation: Private  Auto Accompanied By: self Schedule Follow-up Appointment: Yes Clinical Summary of Care: Electronic Signature(s) Signed: 09/24/2019 5:22:43 PM By: Elliot Gurney, BSN, RN, CWS, Kim RN, BSN Entered By: Elliot Gurney, BSN, RN, CWS, Kim on 09/24/2019 09:57:22 Alice Wallace, Alice Wallace ([id number]) -------------------------------------------------------------------------------- Lower Extremity Assessment Details Patient Name: Alice Wallace Date of Service: 09/24/2019 9:30 AM Medical Record Number: [id number] Patient Account Number: [id number] Date of Birth/Sex: 03-06-1941 (78 y.o. F) Treating RN: Curtis Sites Primary Care Joaquin Knebel: Devoria Glassing Other Clinician: Referring Georgeanna Radziewicz: Devoria Glassing Treating Fardowsa Authier/Extender: Altamese Shepherd in Treatment: 13 Electronic Signature(s) Signed: 09/24/2019 4:27:51 PM By: Curtis Sites Entered By: Curtis Sites on 09/24/2019 09:32:06 Alice Wallace, Alice Wallace ([id number]) -------------------------------------------------------------------------------- Multi Wound Chart Details Patient Name: Alice Wallace Date of Service: 09/24/2019 9:30 AM Medical Record Number: [id number] Patient Account Number: [id number] Date of Birth/Sex: 1941/02/09 (79 y.o. F) Treating RN: Huel Coventry Primary Care Yvonne Stopher: Devoria Glassing Other Clinician: Referring Khaliah Barnick: Devoria Glassing Treating Haden Cavenaugh/Extender: Altamese  in Treatment: 13 Vital Signs Height(in): 64 Pulse(bpm): 80 Weight(lbs): 120 Blood Pressure(mmHg): 135/53 Body Mass Index(BMI): 21 Temperature(F): 98.6 Respiratory Rate(breaths/min): 18 Photos: [1:No Photos] [N/A:N/A] Wound Location: [1:Left Trochanter] [N/A:N/A] Wounding Event: [1:Pressure Injury] [N/A:N/A] Primary Etiology: [1:Pressure Ulcer] [N/A:N/A] Comorbid History: [1:Anemia, Chronic Obstructive Pulmonary Disease (COPD), Arrhythmia, Hypertension] [N/A:N/A] Date Acquired: [1:04/22/2019] [N/A:N/A] Weeks of Treatment: [1:13] [N/A:N/A] Wound Status:  [1:Open] [N/A:N/A] Measurements L x W x D (cm) [1:1.3x1.8x0.2] [N/A:N/A] Area (cm) : [1:1.838] [N/A:N/A] Volume (cm) : [1:0.368] [N/A:N/A] % Reduction in Area: [1:85.00%] [N/A:N/A] % Reduction in Volume: [1:97.90%] [N/A:N/A] Position 1 (o'clock): [1:11] Maximum Distance 1 (cm): [1:5] Tunneling: [1:Yes] [N/A:N/A] Classification: [1:Category/Stage III] [N/A:N/A] Exudate Amount: [1:Medium] [N/A:N/A] Exudate Type: [1:Serous] [N/A:N/A] Exudate Color: [1:amber] [N/A:N/A] Foul Odor After Cleansing: [1:Yes] [N/A:N/A] Odor Anticipated Due to Product No [N/A:N/A] Use: Wound Margin: [1:Flat and Intact] [N/A:N/A] Granulation Amount: [1:Large (67-100%)] [N/A:N/A] Granulation Quality: [1:Red] [N/A:N/A] Necrotic Amount: [1:Small (1-33%)] [N/A:N/A] Exposed Structures: [1:Fat Layer (Subcutaneous Tissue) Exposed: Yes Fascia: No Tendon: No Muscle: No Joint: No Bone: No] [N/A:N/A] Epithelialization: [1:None CHEM CAUT GRANULATION TISS] [N/A:N/A N/A]  Treatment Notes Electronic Signature(s) Signed: 09/24/2019 4:28:02 PM By: Linton Ham MD Entered By: Linton Ham on 09/24/2019 09:52:29 Alice Wallace ([id number]) -------------------------------------------------------------------------------- Multi-Disciplinary Care Plan Details Patient Name: Alice Wallace Date of Service: 09/24/2019 9:30 AM Medical Record Number: [id number] Patient Account Number: [id number] Date of Birth/Sex: Aug 04, 1940 (79 y.o. F) Treating RN: Cornell Barman Primary Care Julieta Rogalski: Odessa Fleming Other Clinician: Referring Genevie Elman: Odessa Fleming Treating Woodward Klem/Extender: Tito Dine in Treatment: 13 Active Inactive Medication Nursing Diagnoses: Knowledge deficit related to medication safety: actual or potential Goals: Patient/caregiver will demonstrate understanding of all current medications Date Initiated: 07/02/2019 Target Resolution Date: 07/31/2019 Goal Status: Active Interventions: Assess for  medication contraindications each visit where new medications are prescribed Notes: Orientation to the Wound Care Program Nursing Diagnoses: Knowledge deficit related to the wound healing center program Goals: Patient/caregiver will verbalize understanding of the [facility] [facility] Date Initiated: 07/02/2019 Target Resolution Date: 07/23/2019 Goal Status: Active Interventions: Provide education on orientation to the wound center Notes: Pressure Nursing Diagnoses: Knowledge deficit related to management of pressures ulcers Goals: Patient will remain free of pressure ulcers Date Initiated: 07/02/2019 Target Resolution Date: 07/16/2019 Goal Status: Active Interventions: Provide education on pressure ulcers Notes: Wound/Skin Impairment Nursing Diagnoses: Impaired tissue integrity Knowledge deficit related to smoking impact on wound healing Goals: Patient/caregiver will verbalize understanding of skin care regimen Date Initiated: 07/02/2019 Target Resolution Date: 07/23/2019 Goal Status: Alice Wallace, Alice Wallace ([id number]) Ulcer/skin breakdown will have a volume reduction of 30% by week 4 Date Initiated: 07/02/2019 Target Resolution Date: 07/30/2019 Goal Status: Active Interventions: Assess ulceration(s) every visit Provide education on ulcer and skin care Notes: Electronic Signature(s) Signed: 09/24/2019 5:22:43 PM By: Gretta Cool, BSN, RN, CWS, Kim RN, BSN Entered By: Gretta Cool, BSN, RN, CWS, Kim on 09/24/2019 09:44:25 Alice Wallace, Alice Wallace ([id number]) -------------------------------------------------------------------------------- Pain Assessment Details Patient Name: Alice Wallace Date of Service: 09/24/2019 9:30 AM Medical Record Number: [id number] Patient Account Number: [id number] Date of Birth/Sex: 1941-03-08 (78 y.o. F) Treating RN: Montey Hora Primary Care Chima Astorino: Odessa Fleming Other Clinician: Referring Shahram Alexopoulos: Odessa Fleming Treating Sandi Towe/Extender: Tito Dine in Treatment: 13 Active Problems Location of Pain Severity and Description of Pain Patient Has Paino No Site Locations Pain Management and Medication Current Pain Management: Electronic Signature(s) Signed: 09/24/2019 4:27:51 PM By: Montey Hora Entered By: Montey Hora on 09/24/2019 09:31:55 Alice Wallace, Alice Wallace ([id number]) -------------------------------------------------------------------------------- Patient/Caregiver Education Details Patient Name: Alice Wallace Date of Service: 09/24/2019 9:30 AM Medical Record Number: [id number] Patient Account Number: [id number] Date of Birth/Gender: [birth year] (79 y.o. F) Treating RN: Cornell Barman Primary Care Physician: Odessa Fleming Other Clinician: Referring Physician: Odessa Fleming Treating Physician/Extender: Tito Dine in Treatment: 13 Education Assessment Education Provided To: Patient Education Topics Provided Wound/Skin Impairment: Handouts: Caring for Your Ulcer, Other: wound care as prescribed Methods: Demonstration, Explain/Verbal Responses: State content correctly Electronic Signature(s) Signed: 09/24/2019 5:22:43 PM By: Gretta Cool, BSN, RN, CWS, Kim RN, BSN Entered By: Gretta Cool, BSN, RN, CWS, Kim on 09/24/2019 09:56:28 Alice Wallace ([id number]) -------------------------------------------------------------------------------- Wound Assessment Details Patient Name: Alice Wallace Date of Service: 09/24/2019 9:30 AM Medical Record Number: [id number] Patient Account Number: [id number] Date of Birth/Sex: 07/11/1940 (78 y.o. F) Treating RN: Montey Hora Primary Care Arelie Kuzel: Odessa Fleming Other Clinician: Referring Tyshauna Finkbiner: Odessa Fleming Treating Aicia Babinski/Extender: Tito Dine in Treatment: 13 Wound Status Wound Number: 1 Primary Pressure Ulcer Etiology: Wound Location: Left Trochanter Wound Status: Open Wounding Event: Pressure Injury Comorbid Anemia, Chronic Obstructive Pulmonary  Disease Date Acquired: 04/22/2019 History: (COPD), Arrhythmia, Hypertension Weeks Of  Treatment: 13 Clustered Wound: No Wound Measurements Length: (cm) 1.3 Width: (cm) 1.8 Depth: (cm) 0.2 Area: (cm) 1.838 Volume: (cm) 0.368 % Reduction in Area: 85% % Reduction in Volume: 97.9% Epithelialization: None Tunneling: Yes Position (o'clock): 11 Maximum Distance: (cm) 5 Undermining: No Wound Description Classification: Category/Stage III Wound Margin: Flat and Intact Exudate Amount: Medium Exudate Type: Serous Exudate Color: amber Foul Odor After Cleansing: Yes Due to Product Use: No Slough/Fibrino Yes Wound Bed Granulation Amount: Large (67-100%) Exposed Structure Granulation Quality: Red Fascia Exposed: No Necrotic Amount: Small (1-33%) Fat Layer (Subcutaneous Tissue) Exposed: Yes Necrotic Quality: Adherent Slough Tendon Exposed: No Muscle Exposed: No Joint Exposed: No Bone Exposed: No Treatment Notes Wound #1 (Left Trochanter) Notes packing strip, s cell, bordered foam dressing Electronic Signature(s) Signed: 09/24/2019 4:27:51 PM By: Curtis Sites Signed: 09/24/2019 5:22:43 PM By: Elliot Gurney, BSN, RN, CWS, Kim RN, BSN Entered By: Elliot Gurney, BSN, RN, CWS, Kim on 09/24/2019 09:45:29 Alice Wallace, Alice Wallace ([id number]) -------------------------------------------------------------------------------- Vitals Details Patient Name: Alice Wallace Date of Service: 09/24/2019 9:30 AM Medical Record Number: [id number] Patient Account Number: [id number] Date of Birth/Sex: [birth year] (79 y.o. F) Treating RN: Curtis Sites Primary Care Aela Bohan: Devoria Glassing Other Clinician: Referring Ariyel Jeangilles: Devoria Glassing Treating Philemon Riedesel/Extender: Altamese [place] in Treatment: 13 Vital Signs Time Taken: 09:31 Temperature (F): 98.6 Height (in): 64 Pulse (bpm): 80 Weight (lbs): 120 Respiratory Rate (breaths/min): 18 Body Mass Index (BMI): 20.6 Blood Pressure (mmHg): 135/53 Reference Range:  80 - 120 mg / dl Electronic Signature(s) Signed: 09/24/2019 4:27:51 PM By: Curtis Sites Entered By: Curtis Sites on 09/24/2019 09:31:47

## 2019-09-25 NOTE — Progress Notes (Signed)
SHIZUE, KASEMAN ([id number]) Visit Report for 09/24/2019 HPI Details Patient Name: Alice Wallace, Alice Wallace Date of Service: 09/24/2019 9:30 AM Medical Record Number: [id number] Patient Account Number: [id number] Date of Birth/Sex: Jan 18, 1941 (79 y.o. F) Treating RN: Huel Coventry Primary Care Provider: Devoria Glassing Other Clinician: Referring Provider: Devoria Glassing Treating Provider/Extender: Altamese [place] in Treatment: 13 History of Present Illness HPI Description: ADMISSION 06/25/2019 This is a 79 year old woman who comes from the [facility] skilled facility to [place] today for review of a pressure ulcer on her left hip. I really do not know much about this and I am not exactly certain how long this is been present. The patient was hospitalized from 1/16 through 1/19 at [facility] with Covid pneumonia all ended she is signed out is having a pressure ulcer on the left ischial tuberosity although there is very little information on this. She comes with an attendant from the facility. 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 Past medical history includes interstitial lung disease, L1 compression fracture, deafness, COPD, Covid pneumonia as described. Socially she has a DSS Child psychotherapist we were not able to contact her before doing this review. My interaction with the patient and allowing her to read my lips I felt that the patient understood what I was saying about her wounds and the need for debridement. I therefore went ahead and treated these aggressively in the clinic. 2/10; patient came in with a necrotic wound on her left greater trochanter last week. Extensive debridement we use Santyl. The wound is cleaned up quite nicely but goes down to periosteum. Lab work I ordered last week showed a white count of 9.3 with a normal differential hemoglobin 8.9. CMP was normal other than an albumin of 3. Her sedimentation rate was 24 and C-reactive protein 19. These are not remarkably high. We  could not get an x-ray of the left greater trochanter done in the facility for some reason so I will order this to be done in the [facility] facility so I can see it online. The patient does not complain of pain 2/17; deep pressure area on the left greater trochanter. The x-ray we obtained at [facility] showed no specific radiographic evidence of osteomyelitis. Unfortunately the patient fell and fractured her left arm she has a cast on her arm 2/24; deep pressure area on the left greater trochanter. This is down to periosteum in this area and probes superiorly. There is some tissue here. We have a wound VAC and I think it is time to apply it. We will see how the wound reacts to this. There was a slight odor to the wound bed and I did a culture today but no empiric antibiotics. The patient is in an [facility] 3/[facility]; deep pressure ulcer over the left greater trochanter. We put a wound VAC on this last week. She comes in today with a much improved wound. The wound is contracted and I think the undermining area is filled then. I could not easily detect any palpable bone. Culture from last time grew Pseudomonas and methicillin resistant staph aureus [MRSA]. This is never an easy combination. I gave her a combination of Cipro and doxycycline as she was allergic to Bactrim. Both of these will be for 7 days. 3/10; patient is completing her antibiotics. She does not appear to require any additional antibiotics. Wound orifice continues to contract there is a lot of this that is granulated however significant tunneling from  9-3 o'clock 3/24; 2-week follow-up. The patient completed her antibiotics. We are using silver collagen under the [facility] 4/7; 2-week follow-up. The patient has a smaller wound although that she still has the tunneling area from 10:00. This is certainly come down in size although there is still depth and undermining we are using collagen under the wound VAC. oShe  has painful area just below her antecubital fossa on the left which was under her cast. I think this is a small thrombosed superficial vein. I do not think this needs specific treatment 4/21; 2-week follow-up. The patient has a smaller wound by quite a bit. She still has a tunneling area at 11:00 which you can fully expose the depth of by pulling the tissue superiorly. 5/5; 2-week follow-up. The wound is come down by quite a bit. She still has the tunneling area at 11:00 that I measured today at 5 cm. There is some sanguinous drainage but no clear evidence of infection. We have been using silver collagen we will change this today. I had put the wound VAC on hold 2 weeks ago I think this can be discontinued clearly at this point Electronic Signature(s) Signed: 09/24/2019 4:28:02 PM By: Baltazar Najjar MD Entered By: Baltazar Najjar on 09/24/2019 09:54:22 Alanis, Lanesha ([id number]) -------------------------------------------------------------------------------- Gaynelle Adu TISS Details Patient Name: [name] Date of Service: 09/24/2019 9:30 AM Medical Record Number: [id number] Patient Account Number: [id number] Date of Birth/Sex: [birth year] (79 y.o. F) Treating RN: Huel Coventry Primary Care Provider: Devoria Glassing Other Clinician: Referring Provider: Devoria Glassing Treating Provider/Extender: Altamese [place] in Treatment: 13 Procedure Performed for: Wound #1 Left Trochanter Performed By: Physician Maxwell Caul, MD Post Procedure Diagnosis Same as Pre-procedure Notes 2 silver nitrate sticks used Electronic Signature(s) Signed: 09/24/2019 4:28:02 PM By: Baltazar Najjar MD Entered By: Baltazar Najjar on 09/24/2019 09:52:41 Wilton, Dorthula ([id number]) -------------------------------------------------------------------------------- Physical Exam Details Patient Name: [name] Date of Service: 09/24/2019 9:30 AM Medical Record Number: [id number] Patient Account  Number: [id number] Date of Birth/Sex: 10/14/1940 (79 y.o. F) Treating RN: Huel Coventry Primary Care Provider: Devoria Glassing Other Clinician: Referring Provider: Devoria Glassing Treating Provider/Extender: Altamese [place] in Treatment: 13 Constitutional Sitting or standing Blood Pressure is within target range for patient.. Pulse regular and within target range for patient.Marland Kitchen Respirations regular, non- labored and within target range.. Temperature is normal and within the target range for the patient.Marland Kitchen appears in no distress. Notes Wound exam; much of the wound looks quite a bit better and smaller. Some hyper granulation of the superficial area. At 11:00 there is a 5 cm tunnel. This does not probe to bone there is some sanguinous drainage. No clear evidence of infection no surrounding soft tissue infection erythema or tenderness. We used silver nitrate on the hyper granulated area and also in the probing tunnel Electronic Signature(s) Signed: 09/24/2019 4:28:02 PM By: Baltazar Najjar MD Entered By: Baltazar Najjar on 09/24/2019 09:58:12 Gatliff, Kimberl ([id number]) -------------------------------------------------------------------------------- Physician Orders Details Patient Name: [name] Date of Service: 09/24/2019 9:30 AM Medical Record Number: [id number] Patient Account Number: [id number] Date of Birth/Sex: 31-May-1940 (79 y.o. F) Treating RN: Huel Coventry Primary Care Provider: Devoria Glassing Other Clinician: Referring Provider: Devoria Glassing Treating Provider/Extender: Altamese [place] in Treatment: 13 Verbal / Phone Orders: No Diagnosis Coding Wound Cleansing Wound #1 Left Trochanter o Clean wound with Normal Saline. Anesthetic (add to Medication List) Wound #1 Left Trochanter o Topical Lidocaine 4% cream applied to wound bed prior to debridement (In Clinic Only).  Skin Barriers/Peri-Wound Care Wound #1 Left Trochanter o Antifungal cream - on reddened  peri-wound areas Primary Wound Dressing Wound #1 Left Trochanter o Iodoform packing Gauze - 1/4 packing strip lightly into wound (11:00 is 5cm deep) o Silver Alginate - over packing strip, Secondary Dressing Wound #1 Left Trochanter o Boardered Foam Dressing Dressing Change Frequency Wound #1 Left Trochanter o Change Dressing Monday, Wednesday, Friday Follow-up Appointments Wound #1 Left Trochanter o Return Appointment in 2 weeks. Off-Loading Wound #1 Left Trochanter o Turn and reposition every 2 hours o Other: - no pressure on wounded area Additional Orders / Instructions Wound #1 Left Trochanter o Increase protein intake. o Activity as tolerated Home Health Wound #1 Left Trochanter o Continue Home Health Visits - [facility]: o Home Health Nurse may visit PRN to address patientos wound care needs. o FACE TO FACE ENCOUNTER: MEDICARE and MEDICAID PATIENTS: I certify that this patient is under my care and that I had a face-to-face encounter that meets the physician face-to-face encounter requirements with this patient on this date. The encounter with the patient was in whole or in part for the following MEDICAL CONDITION: (primary reason for Home Healthcare) MEDICAL NECESSITY: I certify, that based on my findings, NURSING services are a medically necessary home health service. HOME BOUND STATUS: I certify that my clinical findings support that this patient is homebound (i.e., Due to illness or injury, pt requires aid of supportive devices such as crutches, cane, wheelchairs, walkers, the use of special transportation or the assistance of another person to leave their place of residence. There is a normal inability to leave the Slater, Randye ([id number]) home and doing so requires considerable and taxing effort. Other absences are for medical reasons / religious services and are infrequent or of short duration when for other reasons). o If current dressing  causes regression in wound condition, may D/C ordered dressing product/s and apply Normal Saline Moist Dressing daily until next Wound Healing Center / Other MD appointment. Notify Wound Healing Center of regression in wound condition at [phone]. o Please direct any NON-WOUND related issues/requests for orders to patient's Primary Care Physician Negative Pressure Wound Therapy Wound #1 Left Trochanter o Discontinue NPWT. - Sent back by facility. Electronic Signature(s) Signed: 09/24/2019 4:28:02 PM By: Baltazar Najjar MD Signed: 09/24/2019 5:22:43 PM By: Elliot Gurney BSN, RN, CWS, Kim RN, BSN Entered By: Elliot Gurney, BSN, RN, CWS, Kim on 09/24/2019 09:51:39 [name] ([id number]) -------------------------------------------------------------------------------- Problem List Details Patient Name: [name] Date of Service: 09/24/2019 9:30 AM Medical Record Number: [id number] Patient Account Number: [id number] Date of Birth/Sex: 11/24/1940 (79 y.o. F) Treating RN: Huel Coventry Primary Care Provider: Devoria Glassing Other Clinician: Referring Provider: Devoria Glassing Treating Provider/Extender: Altamese [place] in Treatment: 13 Active Problems ICD-10 Encounter Code Description Active Date MDM Diagnosis L89.214 Pressure ulcer of right hip, stage 4 06/25/2019 No Yes Z86.16 Personal history of COVID-19 06/25/2019 No Yes Inactive Problems Resolved Problems Electronic Signature(s) Signed: 09/24/2019 4:28:02 PM By: Baltazar Najjar MD Entered By: Baltazar Najjar on 09/24/2019 09:52:22 Maffei, Quanesha ([id number]) -------------------------------------------------------------------------------- Progress Note Details Patient Name: [name] Date of Service: 09/24/2019 9:30 AM Medical Record Number: [id number] Patient Account Number: [id number] Date of Birth/Sex: 05-12-1941 (79 y.o. F) Treating RN: Huel Coventry Primary Care Provider: Devoria Glassing Other Clinician: Referring Provider: Devoria Glassing Treating Provider/Extender: Altamese [place] in Treatment: 13 Subjective History of Present Illness (HPI) ADMISSION 06/25/2019 This is a 79 year old woman who comes from the [facility] skilled facility to [place] today for  review of a pressure ulcer on her left hip. I really do not know much about this and I am not exactly certain how long this is been present. The patient was [facility] from 1/16 through 1/[facility] with Covid [facility] all ended she is signed out is having a pressure ulcer on the left ischial tuberosity although there is very little information on this. She comes with an attendant from the facility. 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 Past medical history includes interstitial lung disease, L1 compression fracture, deafness, COPD, Covid pneumonia as described. Socially she has a DSS Child psychotherapistsocial worker we were not able to contact her before doing this review. My interaction with the patient and allowing her to read my lips I felt that the patient understood what I was saying about her wounds and the need for debridement. I therefore went ahead and treated these aggressively in the clinic. 2/10; patient came in with a necrotic wound on her left greater trochanter last week. Extensive debridement we use Santyl. The wound is cleaned up quite nicely but goes down to periosteum. Lab work I ordered last week showed a white count of 9.3 with a normal differential hemoglobin 8.9. CMP was normal other than an albumin of 3. Her sedimentation rate was 24 and C-reactive protein 19. These are not remarkably high. We could not get an x-ray of the left greater trochanter done in the facility for some reason so I will order [facility] be [facility] so I can see it online. The patient does not complain of pain 2/17; deep pressure area on the left greater trochanter. [facility] showed no specific radiographic evidence  of osteomyelitis. Unfortunately the patient fell and fractured her left arm she has a cast on her arm 2/24; deep pressure area on the left greater trochanter. This is down to periosteum in this area and probes superiorly. There is some tissue here. We have a wound VAC and I think it is time to apply it. We will see how the wound reacts to this. There was a slight odor to the wound bed and I did a culture today but no empiric antibiotics. The patient is in an [facility] living [facility] 3/3; deep pressure ulcer over the left greater trochanter. We put a wound VAC on this last week. She comes in today with a much improved wound. The wound is contracted and I think the undermining area is filled then. I could not easily detect any palpable bone. Culture from last time grew Pseudomonas and methicillin resistant staph aureus [MRSA]. This is never an easy combination. I gave her a combination of Cipro and doxycycline as she was allergic to Bactrim. Both of these will be for 7 days. 3/10; patient is completing her antibiotics. She does not appear to require any additional antibiotics. Wound orifice continues to contract there is a lot of this that is granulated however significant tunneling from 9-3 o'clock 3/24; 2-week follow-up. The patient completed her antibiotics. [facility]; 2-week follow-up. The patient has a smaller wound although that she still has the tunneling area from 10:00. This is certainly come down in size although there is still depth and undermining we are using collagen under the wound VAC. She has painful area just below her antecubital fossa on the left which was under her cast. I think this is a small  thrombosed superficial vein. I do not think this needs specific treatment 4/21; 2-week follow-up. The patient has a smaller wound by quite a bit. She still has a tunneling area at 11:00 which you can fully expose the depth of by  pulling the tissue superiorly. 5/5; 2-week follow-up. The wound is come down by quite a bit. She still has the tunneling area at 11:00 that I measured today at 5 cm. There is some sanguinous drainage but no clear evidence of infection. We have been using silver collagen we will change this today. I had put the wound VAC on hold 2 weeks ago I think this can be discontinued clearly at this point Objective Constitutional Sitting or standing Blood Pressure is within target range for patient.. Pulse regular and within target range for patient.Marland Kitchen Respirations regular, non- labored and within target range.. Temperature is normal and within the target range for the patient.Marland Kitchen appears in no distress. Vitals Time Taken: 9:31 AM, Height: 64 in, Weight: 120 lbs, BMI: 20.6, Temperature: 98.6 F, Pulse: 80 bpm, Respiratory Rate: 18 breaths/min, Blood Pressure: 135/53 mmHg. General Notes: Wound exam; much of the wound looks quite a bit better and smaller. Some hyper granulation of the superficial area. At 11:00 there is a 5 cm tunnel. This does not probe to bone there is some sanguinous drainage. No clear evidence of infection no surrounding soft tissue infection erythema or tenderness. We used silver nitrate on the hyper granulated area and also in the probing tunnel Diliberto, Heide ([id number]) Integumentary (Hair, Skin) Wound #1 status is Open. Original cause of wound was Pressure Injury. The wound is located on the Left Trochanter. The wound measures 1.3cm length x 1.8cm width x 0.2cm depth; 1.838cm^2 area and 0.368cm^3 volume. There is Fat Layer (Subcutaneous Tissue) Exposed exposed. There is no undermining noted, however, there is tunneling at 11:00 with a maximum distance of 5cm. There is a medium amount of serous drainage noted. Foul odor after cleansing was noted. The wound margin is flat and intact. There is large (67-100%) red granulation within the wound bed. There is a small (1-33%) amount of necrotic  tissue within the wound bed including Adherent Slough. Assessment Active Problems ICD-10 Pressure ulcer of right hip, stage 4 Personal history of COVID-19 Procedures Wound #1 Pre-procedure diagnosis of Wound #1 is a Pressure Ulcer located on the Left Trochanter . An CHEM CAUT GRANULATION TISS procedure was performed by Ricard Dillon, MD. Post procedure Diagnosis Wound #1: Same as Pre-Procedure Notes: 2 silver nitrate sticks used Plan Wound Cleansing: Wound #1 Left Trochanter: Clean wound with Normal Saline. Anesthetic (add to Medication List): Wound #1 Left Trochanter: Topical Lidocaine 4% cream applied to wound bed prior to debridement (In Clinic Only). Skin Barriers/Peri-Wound Care: Wound #1 Left Trochanter: Antifungal cream - on reddened peri-wound areas Primary Wound Dressing: Wound #1 Left Trochanter: Iodoform packing Gauze - 1/4 packing strip lightly into wound (11:00 is 5cm deep) Silver Alginate - over packing strip, Secondary Dressing: Wound #1 Left Trochanter: Boardered Foam Dressing Dressing Change Frequency: Wound #1 Left Trochanter: Change Dressing Monday, Wednesday, Friday Follow-up Appointments: Wound #1 Left Trochanter: Return Appointment in 2 weeks. Off-Loading: Wound #1 Left Trochanter: Turn and reposition every 2 hours Other: - no pressure on wounded area Additional Orders / Instructions: Wound #1 Left Trochanter: Increase protein intake. Activity as tolerated Home Health: Wound #1 Left Trochanter: Continue Home Health Visits - [facility]: Home Health Nurse may visit PRN to address patient s wound care needs. FACE TO FACE  ENCOUNTER: MEDICARE and MEDICAID PATIENTS: I certify that this patient is under my care and that I had a face-to-face encounter that meets the physician face-to-face encounter requirements with this patient on this date. The encounter with the patient was in whole or in part for the following MEDICAL CONDITION: (primary reason  for Home Healthcare) MEDICAL NECESSITY: I certify, that based on my SHARINA, PETRE ([id number]) findings, NURSING services are a medically necessary home health service. HOME BOUND STATUS: I certify that my clinical findings support that this patient is homebound (i.e., Due to illness or injury, pt requires aid of supportive devices such as crutches, cane, wheelchairs, walkers, the use of special transportation or the assistance of another person to leave their place of residence. There is a normal inability to leave the home and doing so requires considerable and taxing effort. Other absences are for medical reasons / religious services and are infrequent or of short duration when for other reasons). If current dressing causes regression in wound condition, may D/C ordered dressing product/s and apply Normal Saline Moist Dressing daily until next Wound Healing Center / Other MD appointment. Notify Wound Healing Center of regression in wound condition at [phone]. Please direct any NON-WOUND related issues/requests for orders to patient's Primary Care Physician Negative Pressure Wound Therapy: Wound #1 Left Trochanter: Discontinue NPWT. - Sent back by facility. 1. We changed to plain packing strip to the tunnel with silver alginate over the top of the wound 2. We had put the wound VAC on hold 2 weeks ago because of superficial candidal infection this is totally resolved. The wound is much too small now to consider wound VAC and that can be discontinued 3. Follow-up in 2 weeks. I am hopeful that they are adequately packing the tunnel. This was the deepest part of this patient's wounded area also at 1 point the most substantial. I cannot determine that there is any widespread undermining beyond this however Electronic Signature(s) Signed: 09/24/2019 4:28:02 PM By: Baltazar Najjar MD Entered By: Baltazar Najjar on 09/24/2019 09:59:42 Porcaro, Chase  ([id number]) -------------------------------------------------------------------------------- SuperBill Details Patient Name: [name] Date of Service: 09/24/2019 Medical Record Number: [id number] Patient Account Number: [id number] Date of Birth/Sex: [birth year] (79 y.o. 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

## 2019-10-08 ENCOUNTER — Other Ambulatory Visit: Payer: Self-pay

## 2019-10-08 ENCOUNTER — Encounter: Payer: Medicare Other | Admitting: Internal Medicine

## 2019-10-08 DIAGNOSIS — L89214 Pressure ulcer of right hip, stage 4: Secondary | ICD-10-CM | POA: Diagnosis not present

## 2019-10-09 NOTE — Progress Notes (Signed)
TIMEA, BREED ([id number]) Visit Report for 10/08/2019 HPI Details Patient Name: Alice Wallace, Alice Wallace Date of Service: 10/08/2019 9:30 AM Medical Record Number: [id number] Patient Account Number: [id number] Date of Birth/Sex: 1941-05-04 (79 y.o. F) Treating RN: Huel Coventry Primary Care Provider: Devoria Glassing Other Clinician: Referring Provider: Devoria Glassing Treating Provider/Extender: Altamese [place] in Treatment: 15 History of Present Illness HPI Description: ADMISSION 06/25/2019 This is a 79 year old woman who comes from the [facility] to [place] today for review of a pressure ulcer on her left hip. I really do not know much about this and I am not exactly certain how long this is been present. The patient was hospitalized from 1/16 through 1/19 at [facility] with Covid pneumonia all ended she is signed out is having a pressure ulcer on the left ischial tuberosity although there is very little information on this. She comes with an attendant from the facility. 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 Past medical history includes interstitial lung disease, L1 compression fracture, deafness, COPD, Covid pneumonia as described. Socially she has a DSS Child psychotherapist we were not able to contact her before doing this review. My interaction with the patient and allowing her to read my lips I felt that the patient understood what I was saying about her wounds and the need for debridement. I therefore went ahead and treated these aggressively in the clinic. 2/10; patient came in with a necrotic wound on her left greater trochanter last week. Extensive debridement we use Santyl. The wound is cleaned up quite nicely but goes down to periosteum. Lab work I ordered last week showed a white count of 9.3 with a normal differential hemoglobin 8.9. CMP was normal other than an albumin of 3. Her sedimentation rate was 24 and C-reactive protein 19. These are not remarkably high. We  could not get an x-ray of the left greater trochanter done in the facility for some reason so I will order this to be done in the [facility] facility so I can see it online. The patient does not complain of pain 2/17; deep pressure area on the left greater trochanter. The x-ray we obtained at [facility] health showed no specific radiographic evidence of osteomyelitis. Unfortunately the patient fell and fractured her left arm she has a cast on her arm 2/24; deep pressure area on the left greater trochanter. This is down to periosteum in this area and probes superiorly. There is some tissue here. We have a wound VAC and I think it is time to apply it. We will see how the wound reacts to this. There was a slight odor to the wound bed and I did a culture today but no empiric antibiotics. The patient is in an [facility] 3/[facility]; deep pressure ulcer over the left greater trochanter. We put a wound VAC on this last week. She comes in today with a much improved wound. The wound is contracted and I think the undermining area is filled then. I could not easily detect any palpable bone. Culture from last time grew Pseudomonas and methicillin resistant staph aureus [MRSA]. This is never an easy combination. I gave her a combination of Cipro and doxycycline as she was allergic to Bactrim. Both of these will be for 7 days. 3/10; patient is completing her antibiotics. She does not appear to require any additional antibiotics. Wound orifice continues to contract there is a lot of this that is granulated however significant tunneling from  9-3 o'clock 3/24; 2-week follow-up. The patient completed her antibiotics. We are using silver collagen under the [facility] 4/7; 2-week follow-up. The patient has a smaller wound although that she still has the tunneling area from 10:00. This is certainly come down in size although there is still depth and undermining we are using collagen under the wound VAC. oShe  has painful area just below her antecubital fossa on the left which was under her cast. I think this is a small thrombosed superficial vein. I do not think this needs specific treatment 4/21; 2-week follow-up. The patient has a smaller wound by quite a bit. She still has a tunneling area at 11:00 which you can fully expose the depth of by pulling the tissue superiorly. 5/5; 2-week follow-up. The wound is come down by quite a bit. She still has the tunneling area at 11:00 that I measured today at 5 cm. There is some sanguinous drainage but no clear evidence of infection. We have been using silver collagen we will change this today. I had put the wound VAC on hold 2 weeks ago I think this can be discontinued clearly at this point 5/19; 2-week follow-up. Tunneling area today was measured at 1 cm a dramatic improvement from 2 weeks ago almost surprisingly so there is no drainage no hyper granulation. We have been using iodoform packing with covering calcium alginate. Electronic Signature(s) Signed: 10/08/2019 5:06:12 PM By: Baltazar Najjar MD Entered By: Baltazar Najjar on 10/08/2019 10:11:20 Alice Wallace ([id number]) -------------------------------------------------------------------------------- Physical Exam Details Patient Name: Alice Wallace Date of Service: 10/08/2019 9:30 AM Medical Record Number: [id number] Patient Account Number: [id number] Date of Birth/Sex: Sep 19, 1940 (79 y.o. F) Treating RN: Huel Coventry Primary Care Provider: Devoria Glassing Other Clinician: Referring Provider: Devoria Glassing Treating Provider/Extender: Altamese [place] in Treatment: 15 Constitutional Sitting or standing Blood Pressure is within target range for patient.. Pulse regular and within target range for patient.Marland Kitchen Respirations regular, non- labored and within target range.. Patient with a mild temperature today but no obvious source. appears in no distress. Respiratory Respiratory effort is easy and  symmetric bilaterally. Rate is normal at rest and on room air.. Notes Wound exam; much of the wound looks quite a bit better and dramatically smaller. No hyper granulation is identified the 5 cm tunnel from last time is down to 1 cm measured by both the intake nurse and myself. There is no surrounding erythema no purulent drainage and no palpable tenderness. Electronic Signature(s) Signed: 10/08/2019 5:06:12 PM By: Baltazar Najjar MD Entered By: Baltazar Najjar on 10/08/2019 10:12:27 Alice Wallace ([id number]) -------------------------------------------------------------------------------- Physician Orders Details Patient Name: Alice Wallace Date of Service: 10/08/2019 9:30 AM Medical Record Number: [id number] Patient Account Number: [id number] Date of Birth/Sex: 12-28-1940 (78 y.o. F) Treating RN: Huel Coventry Primary Care Provider: Devoria Glassing Other Clinician: Referring Provider: Devoria Glassing Treating Provider/Extender: Altamese [place] in Treatment: 15 Verbal / Phone Orders: No Diagnosis Coding Wound Cleansing Wound #1 Left Trochanter o Clean wound with Normal Saline. Anesthetic (add to Medication List) Wound #1 Left Trochanter o Topical Lidocaine 4% cream applied to wound bed prior to debridement (In Clinic Only). Skin Barriers/Peri-Wound Care Wound #1 Left Trochanter o Antifungal cream - If needed, on reddened peri-wound areas Primary Wound Dressing Wound #1 Left Trochanter o Silver Alginate - over packing strip, o Iodoform packing Gauze - 1/4 packing strip lightly into wound (11:00 is 1cm deep) Secondary Dressing Wound #1 Left Trochanter o Boardered Foam Dressing Dressing Change Frequency Wound #1 Left Trochanter   o Change Dressing Monday, Wednesday, Friday Follow-up Appointments Wound #1 Left Trochanter o Return Appointment in 2 weeks. Off-Loading Wound #1 Left Trochanter o Turn and reposition every 2 hours o Other: - no pressure on  wounded area Additional Orders / Instructions Wound #1 Left Trochanter o Increase protein intake. o Activity as tolerated Home Health Wound #1 Left Trochanter o Continue Home Health Visits - [facility]: o Home Health Nurse may visit PRN to address patientos wound care needs. o FACE TO FACE ENCOUNTER: MEDICARE and MEDICAID PATIENTS: I certify that this patient is under my care and that I had a face-to-face encounter that meets the physician face-to-face encounter requirements with this patient on this date. The encounter with the patient was in whole or in part for the following MEDICAL CONDITION: (primary reason for Home Healthcare) MEDICAL NECESSITY: I certify, that based on my findings, NURSING services are a medically necessary home health service. HOME BOUND STATUS: I certify that my clinical findings support that this patient is homebound (i.e., Due to illness or injury, pt requires aid of supportive devices such as crutches, cane, wheelchairs, walkers, the use of special transportation or the assistance of another person to leave their place of residence. There is a normal inability to leave the [place], [place] ([id number]) home and doing so requires considerable and taxing effort. Other absences are for medical reasons / religious services and are infrequent or of short duration when for other reasons). o If current dressing causes regression in wound condition, may D/C ordered dressing product/s and apply Normal Saline Moist Dressing daily until next Wound Healing Center / Other MD appointment. Notify Wound Healing Center of regression in wound condition at [phone]. o Please direct any NON-WOUND related issues/requests for orders to patient's Primary Care Physician Electronic Signature(s) Signed: 10/08/2019 5:06:12 PM By: Baltazar Najjar MD Signed: 10/09/2019 9:48:50 AM By: Elliot Gurney, BSN, RN, CWS, Kim RN, BSN Entered By: Elliot Gurney, BSN, RN, CWS, Kim on 10/08/2019  10:04:05 Alice Wallace ([id number]) -------------------------------------------------------------------------------- Problem List Details Patient Name: Alice Wallace Date of Service: 10/08/2019 9:30 AM Medical Record Number: [id number] Patient Account Number: [id number] Date of Birth/Sex: [birth year] (79 y.o. F) Treating RN: Huel Coventry Primary Care Provider: Devoria Glassing Other Clinician: Referring Provider: Devoria Glassing Treating Provider/Extender: Altamese [place] in Treatment: 15 Active Problems ICD-10 Encounter Code Description Active Date MDM Diagnosis L89.214 Pressure ulcer of right hip, stage 4 06/25/2019 No Yes Z86.16 Personal history of COVID-19 06/25/2019 No Yes Inactive Problems Resolved Problems Electronic Signature(s) Signed: 10/08/2019 5:06:12 PM By: Baltazar Najjar MD Entered By: Baltazar Najjar on 10/08/2019 10:09:26 Alice Wallace ([id number]) -------------------------------------------------------------------------------- Progress Note Details Patient Name: Alice Wallace Date of Service: 10/08/2019 9:30 AM Medical Record Number: [id number] Patient Account Number: [id number] Date of Birth/Sex: [birth year] (79 y.o. F) Treating RN: Huel Coventry Primary Care Provider: Devoria Glassing Other Clinician: Referring Provider: Devoria Glassing Treating Provider/Extender: Altamese [place] in Treatment: 15 Subjective History of Present Illness (HPI) ADMISSION 06/25/2019 This is a 79 year old woman who comes from the [facility] skilled facility to [place] today for review of a pressure ulcer on her left hip. I really do not know much about this and I am not exactly certain how long this is been present. The patient was hospitalized from 1/16 through 1/19 at [facility] with Covid pneumonia all ended she is signed out is having a pressure ulcer on the left ischial tuberosity although there is very little information on this. She comes with an attendant from the facility. She is  deaf, can write minimally but she can read lips. Past medical history includes interstitial lung disease, L1 compression fracture, deafness, COPD, Covid pneumonia as described. Socially she has a DSS Child psychotherapistsocial worker we were not able to contact her before doing this review. My interaction with the patient and allowing her to read my lips I felt that the patient understood what I was saying about her wounds and the need for debridement. I therefore went ahead and treated these aggressively in the clinic. 2/10; patient came in with a necrotic wound on her left greater trochanter last week. Extensive debridement we use Santyl. The wound is cleaned up quite nicely but goes down to periosteum. Lab work I ordered last week showed a white count of 9.3 with a normal differential hemoglobin 8.9. CMP was normal other than an albumin of 3. Her sedimentation rate was 24 and C-reactive protein 19. These are not remarkably high. We could not get an x-ray of the left greater trochanter done in the facility for some reason so I will order this to be done in the [facility] facility so I can see it online. The patient does not complain of pain 2/17; deep pressure area on the left greater trochanter. [facility] showed no specific radiographic evidence of osteomyelitis. Unfortunately the patient fell and fractured her left arm she has a cast on her arm 2/24; deep pressure area on the left greater trochanter. This is down to periosteum in this area and probes superiorly. There is some tissue here. We have a wound VAC and I think it is time to apply it. We will see how the wound reacts to this. There was a slight odor to the wound bed and I did a culture today but no empiric antibiotics. The patient is in an [facility] living [facility] 3/3; deep pressure ulcer over the left greater trochanter. We put a wound VAC on this last week. She comes in today with a much improved wound. The wound  is contracted and I think the undermining area is filled then. I could not easily detect any palpable bone. Culture from last time grew Pseudomonas and methicillin resistant staph aureus [MRSA]. This is never an easy combination. I gave her a combination of Cipro and doxycycline as she was allergic to Bactrim. Both of these will be for 7 days. 3/10; patient is completing her antibiotics. She does not appear to require any additional antibiotics. Wound orifice continues to contract there is a lot of this that is granulated however significant tunneling from 9-3 o'clock 3/24; 2-week follow-up. The patient completed her antibiotics. [facility]; 2-week follow-up. The patient has a smaller wound although that she still has the tunneling area from 10:00. This is certainly come down in size although there is still depth and undermining we are using collagen under the wound VAC. She has painful area just below her antecubital fossa on the left which was under her cast. I think this is a small thrombosed superficial vein. I do not think this needs specific treatment 4/21; 2-week follow-up. The patient has a smaller wound by quite a bit. She still has a tunneling area at 11:00 which you can fully expose the depth of by pulling the tissue superiorly. 5/5; 2-week follow-up. The wound is come down by quite a bit. She still has the tunneling area at 11:00 that I measured today at 5 cm. There is some sanguinous drainage  but no clear evidence of infection. We have been using silver collagen we will change this today. I had put the wound VAC on hold 2 weeks ago I think this can be discontinued clearly at this point 5/19; 2-week follow-up. Tunneling area today was measured at 1 cm a dramatic improvement from 2 weeks ago almost surprisingly so there is no drainage no hyper granulation. We have been using iodoform packing with covering calcium  alginate. Objective Constitutional Sitting or standing Blood Pressure is within target range for patient.. Pulse regular and within target range for patient.Marland Kitchen Respirations regular, non- labored and within target range.. Patient with a mild temperature today but no obvious source. appears in no distress. Vitals Time Taken: 9:50 AM, Height: 64 in, Weight: 120 lbs, BMI: 20.6, Temperature: 99.4 F, Pulse: 75 bpm, Respiratory Rate: 16 breaths/min, Blood Pressure: 131/55 mmHg. Respiratory Respiratory effort is easy and symmetric bilaterally. Rate is normal at rest and on room air.Audry Riles, [place] ([id number]) General Notes: Wound exam; much of the wound looks quite a bit better and dramatically smaller. No hyper granulation is identified the 5 cm tunnel from last time is down to 1 cm measured by both the intake nurse and myself. There is no surrounding erythema no purulent drainage and no palpable tenderness. Integumentary (Hair, Skin) Wound #1 status is Open. Original cause of wound was Pressure Injury. The wound is located on the Left Trochanter. The wound measures 0.9cm length x 0.5cm width x 0.2cm depth; 0.353cm^2 area and 0.071cm^3 volume. There is Fat Layer (Subcutaneous Tissue) Exposed exposed. There is no undermining noted, however, there is tunneling at 11:00 with a maximum distance of 1cm. There is a medium amount of serous drainage noted. Foul odor after cleansing was noted. The wound margin is flat and intact. There is large (67-100%) red granulation within the wound bed. There is no necrotic tissue within the wound bed. Assessment Active Problems ICD-10 Pressure ulcer of right hip, stage 4 Personal history of COVID-19 Plan Wound Cleansing: Wound #1 Left Trochanter: Clean wound with Normal Saline. Anesthetic (add to Medication List): Wound #1 Left Trochanter: Topical Lidocaine 4% cream applied to wound bed prior to debridement (In Clinic Only). Skin Barriers/Peri-Wound  Care: Wound #1 Left Trochanter: Antifungal cream - If needed, on reddened peri-wound areas Primary Wound Dressing: Wound #1 Left Trochanter: Silver Alginate - over packing strip, Iodoform packing Gauze - 1/4 packing strip lightly into wound (11:00 is 1cm deep) Secondary Dressing: Wound #1 Left Trochanter: Boardered Foam Dressing Dressing Change Frequency: Wound #1 Left Trochanter: Change Dressing Monday, Wednesday, Friday Follow-up Appointments: Wound #1 Left Trochanter: Return Appointment in 2 weeks. Off-Loading: Wound #1 Left Trochanter: Turn and reposition every 2 hours Other: - no pressure on wounded area Additional Orders / Instructions: Wound #1 Left Trochanter: Increase protein intake. Activity as tolerated Home Health: Wound #1 Left Trochanter: Continue Home Health Visits - [facility]: Home Health Nurse may visit PRN to address patient s wound care needs. FACE TO FACE ENCOUNTER: MEDICARE and MEDICAID PATIENTS: I certify that this patient is under my care and that I had a face-to-face encounter that meets the physician face-to-face encounter requirements with this patient on this date. The encounter with the patient was in whole or in part for the following MEDICAL CONDITION: (primary reason for [place]) MEDICAL NECESSITY: I certify, that based on my findings, NURSING services are a medically necessary home health service. HOME BOUND STATUS: I certify that my clinical findings support that this patient is homebound (i.e.,  Due to illness or injury, pt requires aid of supportive devices such as crutches, cane, wheelchairs, walkers, the use of special transportation or the assistance of another person to leave their place of residence. There is a normal inability to leave the home and doing so requires considerable and taxing effort. Other absences are for medical reasons / religious services and are infrequent or of short duration when for other reasons). If current  dressing causes regression in wound condition, may D/C ordered dressing product/s and apply Normal Saline Moist Dressing daily until next Wound Healing Center / Other MD appointment. Notify Wound Healing Center of regression in wound condition at [phone]. Please direct any NON-WOUND related issues/requests for orders to patient's Primary Care Physician Yantis, Imogen ([id number]) 1. Greater trochanter. Dramatic improvement. Improvement in the wound depth was so major that I actually remeasured this myself and gently probed in the area I could not identify the 5 cm tunnel that we did last time 2. No reason to change the dressing which is iodoform covered by silver alginate. 3. 1 cm of depth is fairly remarkable Electronic Signature(s) Signed: 10/08/2019 5:06:12 PM By: Baltazar Najjar MD Entered By: Baltazar Najjar on 10/08/2019 10:14:08 Alice Wallace ([id number]) -------------------------------------------------------------------------------- SuperBill Details Patient Name: Alice Wallace Date of Service: 10/08/2019 Medical Record Number: [id number] Patient Account Number: [id number] Date of Birth/Sex: February 04, 1941 (78 y.o. 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

## 2019-10-09 NOTE — Progress Notes (Signed)
GARNETTE, GREB ([id number]) Visit Report for 10/08/2019 Arrival Information Details Patient Name: Alice Wallace, Alice Wallace Date of Service: 10/08/2019 9:30 AM Medical Record Number: [id number] Patient Account Number: [id number] Date of Birth/Sex: [birth year] (79 y.o. F) Treating RN: Montey Hora Primary Care Jarelle Ates: Odessa Fleming Other Clinician: Referring Xoie Kreuser: Odessa Fleming Treating Britaney Espaillat/Extender: Tito Dine in Treatment: 15 Visit Information History Since Last Visit Added or deleted any medications: No Patient Arrived: Walker Any new allergies or adverse reactions: No Arrival Time: 09:49 Had a fall or experienced change in No Accompanied By: caregiver activities of daily living that may affect Transfer Assistance: None risk of falls: Patient Identification Verified: Yes Signs or symptoms of abuse/neglect since last visito No Secondary Verification Process Completed: Yes Hospitalized since last visit: No Implantable device outside of the clinic excluding No cellular tissue based products placed in the center since last visit: Has Dressing in Place as Prescribed: Yes Pain Present Now: No Electronic Signature(s) Signed: 10/08/2019 4:52:06 PM By: Montey Hora Entered By: Montey Hora on 10/08/2019 09:49:53 [place], Alice Wallace ([id number]) -------------------------------------------------------------------------------- Clinic Level of Care Assessment Details Patient Name: Alice Wallace Date of Service: 10/08/2019 9:30 AM Medical Record Number: [id number] Patient Account Number: [id number] Date of Birth/Sex: 1940-07-02 (78 y.o. F) Treating RN: Cornell Barman Primary Care Sherlonda Flater: Odessa Fleming Other Clinician: Referring Dmoni Fortson: Odessa Fleming Treating Daven Pinckney/Extender: Tito Dine in Treatment: 15 Clinic Level of Care Assessment Items TOOL 4 Quantity Score []  - Use when only an EandM is performed on FOLLOW-UP visit 0 ASSESSMENTS - Nursing Assessment  / Reassessment X - Reassessment of Co-morbidities (includes updates in patient status) 1 10 X- 1 5 Reassessment of Adherence to Treatment Plan ASSESSMENTS - Wound and Skin Assessment / Reassessment X - Simple Wound Assessment / Reassessment - one wound 1 5 []  - 0 Complex Wound Assessment / Reassessment - multiple wounds []  - 0 Dermatologic / Skin Assessment (not related to wound area) ASSESSMENTS - Focused Assessment []  - Circumferential Edema Measurements - multi extremities 0 []  - 0 Nutritional Assessment / Counseling / Intervention []  - 0 Lower Extremity Assessment (monofilament, tuning fork, pulses) []  - 0 Peripheral Arterial Disease Assessment (using hand held doppler) ASSESSMENTS - Ostomy and/or Continence Assessment and Care []  - Incontinence Assessment and Management 0 []  - 0 Ostomy Care Assessment and Management (repouching, etc.) PROCESS - Coordination of Care X - Simple Patient / Family Education for ongoing care 1 15 []  - 0 Complex (extensive) Patient / Family Education for ongoing care []  - 0 Staff obtains Programmer, systems, Records, Test Results / Process Orders []  - 0 Staff telephones HHA, Nursing Homes / Clarify orders / etc []  - 0 Routine Transfer to another Facility (non-emergent condition) []  - 0 Routine Hospital Admission (non-emergent condition) []  - 0 New Admissions / Biomedical engineer / Ordering NPWT, Apligraf, etc. []  - 0 Emergency Hospital Admission (emergent condition) X- 1 10 Simple Discharge Coordination []  - 0 Complex (extensive) Discharge Coordination PROCESS - Special Needs []  - Pediatric / Minor Patient Management 0 []  - 0 Isolation Patient Management []  - 0 Hearing / Language / Visual special needs []  - 0 Assessment of Community assistance (transportation, D/C planning, etc.) []  - 0 Additional assistance / Altered mentation []  - 0 Support Surface(s) Assessment (bed, cushion, seat, etc.) INTERVENTIONS - Wound Cleansing /  Measurement Wallace, Alice ([id number]) X- 1 5 Simple Wound Cleansing - one wound []  - 0 Complex Wound Cleansing - multiple wounds X- 1 5 Wound Imaging (photographs - any number of wounds) []  -  0 Wound Tracing (instead of photographs) X- 1 5 Simple Wound Measurement - one wound []  - 0 Complex Wound Measurement - multiple wounds INTERVENTIONS - Wound Dressings []  - Small Wound Dressing one or multiple wounds 0 X- 1 15 Medium Wound Dressing one or multiple wounds []  - 0 Large Wound Dressing one or multiple wounds []  - 0 Application of Medications - topical []  - 0 Application of Medications - injection INTERVENTIONS - Miscellaneous []  - External ear exam 0 []  - 0 Specimen Collection (cultures, biopsies, blood, body fluids, etc.) []  - 0 Specimen(s) / Culture(s) sent or taken to Lab for analysis []  - 0 Patient Transfer (multiple staff / / Similar devices) []  - 0 Simple Staple / Suture removal (25 or less) []  - 0 Complex Staple / Suture removal (26 or more) []  - 0 Hypo / Hyperglycemic Management (close monitor of Blood Glucose) []  - 0 Ankle / Brachial Index (ABI) - do not check if billed separately X- 1 5 Vital Signs Has the patient been seen at the hospital within the last three years: Yes Total Score: 80 Level Of Care: New/Established - Level 3 Electronic Signature(s) Signed: 10/09/2019 9:48:50 AM By: , BSN, RN, CWS, Kim RN, BSN Entered By: , BSN, RN, CWS, Kim on 10/08/2019 10:04:39 ( ) -------------------------------------------------------------------------------- Encounter Discharge Information Details Patient Name: Date of Service: 10/08/2019 9:30 AM Medical Record Number: Nurse, adult Patient Account Number: Date of Birth/Sex: September 03, 1940 (78 y.o. F) Treating RN: Primary Care Pinki Rottman: 10/11/2019 Other Clinician: Referring Jarryn Altland: Elliot Gurney Treating Analeigha Nauman/Extender:  Elliot Gurney in Treatment: 15 Encounter Discharge Information Items Discharge Condition: Stable Ambulatory Status: Walker Discharge Destination: Home Transportation: Private Auto Accompanied By: caregiver Schedule Follow-up Appointment: Yes Clinical Summary of Care: Electronic Signature(s) Signed: 10/09/2019 9:48:50 AM By: Alice Wallace, BSN, RN, CWS, Kim RN, BSN Entered By: [id number], BSN, RN, CWS, Kim on 10/08/2019 10:05:51 10/10/2019 ([id number]) -------------------------------------------------------------------------------- Lower Extremity Assessment Details Patient Name: [id number] Date of Service: 10/08/2019 9:30 AM Medical Record Number: [id number] Patient Account Number: Huel Coventry Date of Birth/Sex: 04/16/1941 (78 y.o. F) Treating RN: Altamese [place] Primary Care Pranit Owensby: 10/11/2019 Other Clinician: Referring Niels Cranshaw: Elliot Gurney Treating Yardley Beltran/Extender: Elliot Gurney Weeks in Treatment: 15 Electronic Signature(s) Signed: 10/08/2019 4:52:06 PM By: Alice Wallace Entered By: [id number] on 10/08/2019 09:50:08 Wallace, Alice (10/10/2019) -------------------------------------------------------------------------------- Multi Wound Chart Details Patient Name: [id number] Date of Service: 10/08/2019 9:30 AM Medical Record Number: 12/26/[id number] Patient Account Number: [id number] Date of Birth/Sex: Jul 09, 1940 (79 y.o. F) Treating RN: Devoria Glassing Primary Care Tarig Zimmers: Maxwell Caul Other Clinician: Referring Brianna Esson: 10/10/2019 Treating Ilea Hilton/Extender: Curtis Sites in Treatment: 15 Vital Signs Height(in): 64 Pulse(bpm): 75 Weight(lbs): 120 Blood Pressure(mmHg): 131/55 Body Mass Index(BMI): 21 Temperature(F): 99.4 Respiratory Rate(breaths/min): 16 Photos: [N/A:N/A] Wound Location: Left Trochanter N/A N/A Wounding Event: Pressure Injury N/A N/A Primary Etiology: Pressure Ulcer N/A N/A Comorbid History: Anemia, Chronic  Obstructive N/A N/A Pulmonary Disease (COPD), Arrhythmia, Hypertension Date Acquired: 04/22/2019 N/A N/A Weeks of Treatment: 15 N/A N/A Wound Status: Open N/A N/A Measurements L x W x D (cm) 0.9x0.5x0.2 N/A N/A Area (cm) : 0.353 N/A N/A Volume (cm) : 0.071 N/A N/A % Reduction in Area: 97.10% N/A N/A % Reduction in Volume: 99.60% N/A N/A Position 1 (o'clock): 11 Maximum Distance 1 (cm): 1 Tunneling: Yes N/A N/A Classification: Category/Stage III N/A N/A Exudate Amount: Medium N/A N/A Exudate Type: Serous N/A N/A Exudate Color: amber N/A N/A Foul  Odor After Cleansing: Yes N/A N/A Odor Anticipated Due to Product No N/A N/A Use: Wound Margin: Flat and Intact N/A N/A Granulation Amount: Large (67-100%) N/A N/A Granulation Quality: Red N/A N/A Necrotic Amount: None Present (0%) N/A N/A Exposed Structures: Fat Layer (Subcutaneous Tissue) N/A N/A Exposed: Yes Fascia: No Tendon: No Muscle: No Joint: No Bone: No Epithelialization: None N/A N/A Treatment Notes Wound #1 (Left Trochanter) Wallace, Alice ([id number]) Notes packing strip, s cell, bordered foam dressing Electronic Signature(s) Signed: 10/08/2019 5:06:12 PM By: Baltazar Najjar MD Entered By: Baltazar Najjar on 10/08/2019 10:09:38 Alice Wallace ([id number]) -------------------------------------------------------------------------------- Multi-Disciplinary Care Plan Details Patient Name: Alice Wallace Date of Service: 10/08/2019 9:30 AM Medical Record Number: [id number] Patient Account Number: [id number] Date of Birth/Sex: 1940-06-19 (79 y.o. F) Treating RN: Huel Coventry Primary Care Tiajuana Leppanen: Devoria Glassing Other Clinician: Referring Caleah Tortorelli: Devoria Glassing Treating Aram Domzalski/Extender: Altamese [place] in Treatment: 15 Active Inactive Medication Nursing Diagnoses: Knowledge deficit related to medication safety: actual or potential Goals: Patient/caregiver will demonstrate understanding of all current  medications Date Initiated: 07/02/2019 Target Resolution Date: 07/31/2019 Goal Status: Active Interventions: Assess for medication contraindications each visit where new medications are prescribed Notes: Orientation to the Wound Care Program Nursing Diagnoses: Knowledge deficit related to the wound healing center program Goals: Patient/caregiver will verbalize understanding of the Wound Healing Center Program Date Initiated: 07/02/2019 Target Resolution Date: 07/23/2019 Goal Status: Active Interventions: Provide education on orientation to the wound center Notes: Pressure Nursing Diagnoses: Knowledge deficit related to management of pressures ulcers Goals: Patient will remain free of pressure ulcers Date Initiated: 07/02/2019 Target Resolution Date: 07/16/2019 Goal Status: Active Interventions: Provide education on pressure ulcers Notes: Wound/Skin Impairment Nursing Diagnoses: Impaired tissue integrity Knowledge deficit related to smoking impact on wound healing Goals: Patient/caregiver will verbalize understanding of skin care regimen Date Initiated: 07/02/2019 Target Resolution Date: 07/23/2019 Goal Status: Alice Wallace, Alice Wallace ([id number]) Ulcer/skin breakdown will have a volume reduction of 30% by week 4 Date Initiated: 07/02/2019 Target Resolution Date: 07/30/2019 Goal Status: Active Interventions: Assess ulceration(s) every visit Provide education on ulcer and skin care Notes: Electronic Signature(s) Signed: 10/09/2019 9:48:50 AM By: Elliot Gurney, BSN, RN, CWS, Kim RN, BSN Entered By: Elliot Gurney, BSN, RN, CWS, Kim on 10/08/2019 10:02:59 Alice Wallace ([id number]) -------------------------------------------------------------------------------- Pain Assessment Details Patient Name: Alice Wallace Date of Service: 10/08/2019 9:30 AM Medical Record Number: [id number] Patient Account Number: [id number] Date of Birth/Sex: 1941/05/08 (78 y.o. F) Treating RN: Curtis Sites Primary Care  Diondre Pulis: Devoria Glassing Other Clinician: Referring Everrett Lacasse: Devoria Glassing Treating Aavya Shafer/Extender: Altamese [place] in Treatment: 15 Active Problems Location of Pain Severity and Description of Pain Patient Has Paino No Site Locations Pain Management and Medication Current Pain Management: Electronic Signature(s) Signed: 10/08/2019 4:52:06 PM By: Curtis Sites Entered By: Curtis Sites on 10/08/2019 09:50:00 Alice Wallace ([id number]) -------------------------------------------------------------------------------- Patient/Caregiver Education Details Patient Name: Alice Wallace Date of Service: 10/08/2019 9:30 AM Medical Record Number: [id number] Patient Account Number: [id number] Date of Birth/Gender: [birth year] (79 y.o. F) Treating RN: Huel Coventry Primary Care Physician: Devoria Glassing Other Clinician: Referring Physician: Devoria Glassing Treating Physician/Extender: Altamese [place] in Treatment: 15 Education Assessment Education Provided To: Patient Education Topics Provided Pressure: Handouts: Other: keep off of wounded area Wound/Skin Impairment: Handouts: Caring for Your Ulcer, Other: wound care as prescribed Methods: Demonstration, Explain/Verbal Responses: State content correctly Electronic Signature(s) Signed: 10/09/2019 9:48:50 AM By: Elliot Gurney, BSN, RN, CWS, Kim RN, BSN Entered By: Elliot Gurney, BSN, RN, CWS, Kim on 10/08/2019 10:05:19 Alice Wallace, Alice Wallace ([id number]) -------------------------------------------------------------------------------- Wound  Assessment Details Patient Name: Alice Wallace, Alice Wallace Date of Service: 10/08/2019 9:30 AM Medical Record Number: [id number] Patient Account Number: [id number] Date of Birth/Sex: December 16, 1940 (79 y.o. F) Treating RN: Curtis Sites Primary Care Braydyn Schultes: Devoria Glassing Other Clinician: Referring Nikisha Fleece: Devoria Glassing Treating Jaren Kearn/Extender: Altamese [place] in Treatment: 15 Wound Status Wound  Number: 1 Primary Pressure Ulcer Etiology: Wound Location: Left Trochanter Wound Status: Open Wounding Event: Pressure Injury Comorbid Anemia, Chronic Obstructive Pulmonary Disease Date Acquired: 04/22/2019 History: (COPD), Arrhythmia, Hypertension Weeks Of Treatment: 15 Clustered Wound: No Photos Wound Measurements Length: (cm) 0.9 Width: (cm) 0.5 Depth: (cm) 0.2 Area: (cm) 0.353 Volume: (cm) 0.071 % Reduction in Area: 97.1% % Reduction in Volume: 99.6% Epithelialization: None Tunneling: Yes Position (o'clock): 11 Maximum Distance: (cm) 1 Undermining: No Wound Description Classification: Category/Stage III Fo Wound Margin: Flat and Intact Du Exudate Amount: Medium Sl Exudate Type: Serous Exudate Color: amber ul Odor After Cleansing: Yes e to Product Use: No ough/Fibrino Yes Wound Bed Granulation Amount: Large (67-100%) Exposed Structure Granulation Quality: Red Fascia Exposed: No Necrotic Amount: None Present (0%) Fat Layer (Subcutaneous Tissue) Exposed: Yes Tendon Exposed: No Muscle Exposed: No Joint Exposed: No Bone Exposed: No Treatment Notes Wound #1 (Left Trochanter) Notes packing strip, s cell, bordered foam dressing Alice Wallace, Alice Wallace ([id number]) Electronic Signature(s) Signed: 10/08/2019 4:52:06 PM By: Curtis Sites Entered By: Curtis Sites on 10/08/2019 09:55:35 Augustine, Mackenzi ([id number]) -------------------------------------------------------------------------------- Vitals Details Patient Name: Alice Wallace Date of Service: 10/08/2019 9:30 AM Medical Record Number: [id number] Patient Account Number: [id number] Date of Birth/Sex: [birth year] (79 y.o. F) Treating RN: Curtis Sites Primary Care Mahalia Dykes: Devoria Glassing Other Clinician: Referring Jalayne Ganesh: Devoria Glassing Treating Astha Probasco/Extender: Altamese [place] in Treatment: 15 Vital Signs Time Taken: 09:50 Temperature (F): 99.4 Height (in): 64 Pulse (bpm): 75 Weight (lbs):  120 Respiratory Rate (breaths/min): 16 Body Mass Index (BMI): 20.6 Blood Pressure (mmHg): 131/55 Reference Range: 80 - 120 mg / dl Electronic Signature(s) Signed: 10/08/2019 4:52:06 PM By: Curtis Sites Entered By: Curtis Sites on 10/08/2019 09:50:44

## 2019-10-22 ENCOUNTER — Other Ambulatory Visit: Payer: Self-pay

## 2019-10-22 ENCOUNTER — Encounter: Payer: Medicare Other | Attending: Internal Medicine | Admitting: Internal Medicine

## 2019-10-22 DIAGNOSIS — J449 Chronic obstructive pulmonary disease, unspecified: Secondary | ICD-10-CM | POA: Diagnosis not present

## 2019-10-22 DIAGNOSIS — J849 Interstitial pulmonary disease, unspecified: Secondary | ICD-10-CM | POA: Insufficient documentation

## 2019-10-22 DIAGNOSIS — L89214 Pressure ulcer of right hip, stage 4: Secondary | ICD-10-CM | POA: Insufficient documentation

## 2019-10-22 DIAGNOSIS — I10 Essential (primary) hypertension: Secondary | ICD-10-CM | POA: Insufficient documentation

## 2019-10-22 DIAGNOSIS — H9190 Unspecified hearing loss, unspecified ear: Secondary | ICD-10-CM | POA: Diagnosis not present

## 2019-10-22 DIAGNOSIS — Z8616 Personal history of COVID-19: Secondary | ICD-10-CM | POA: Diagnosis not present

## 2019-10-22 DIAGNOSIS — Z881 Allergy status to other antibiotic agents status: Secondary | ICD-10-CM | POA: Insufficient documentation

## 2019-10-23 NOTE — Progress Notes (Signed)
Alice Wallace, Alice Wallace ([id number]) Visit Report for 10/22/2019 HPI Details Patient Name: Alice Wallace, Alice Wallace Date of Service: 10/22/2019 9:30 AM Medical Record Number: [id number] Patient Account Number: [id number] Date of Birth/Sex: 09/22/1940 (79 y.o. F) Treating RN: Huel CoventryWoody, Kim Primary Care Provider: Devoria GlassingLAMBERT, TRACEY Other Clinician: Referring Provider: Devoria GlassingLAMBERT, TRACEY Treating Provider/Extender: Altamese CarolinaOBSON, Chancy Claros G Weeks in Treatment: 17 History of Present Illness HPI Description: ADMISSION 06/25/2019 This is a 79 year old woman who comes from the [facility] to us today for review of a pressure ulcer on her left hip. I really do not know much about this and I am not exactly certain how long this is been present. The patient was hospitalized from 1/16 through 1/[facility] with Covid [facility] all ended she is signed out is having a pressure ulcer on the left ischial tuberosity although there is very little information on this. She comes with an attendant from the facility. 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 Past medical history includes interstitial lung disease, L1 compression fracture, deafness, COPD, Covid pneumonia as described. Socially she has a DSS Child psychotherapistsocial worker we were not able to contact her before doing this review. My interaction with the patient and allowing her to read my lips I felt that the patient understood what I was saying about her wounds and the need for debridement. I therefore went ahead and treated these aggressively in the clinic. 2/10; patient came in with a necrotic wound on her left greater trochanter last week. Extensive debridement we use Santyl. The wound is cleaned up quite nicely but goes down to periosteum. Lab work I ordered last week showed a white count of 9.3 with a normal differential hemoglobin 8.9. CMP was normal other than an albumin of 3. Her sedimentation rate was 24 and C-reactive protein 19. These are not remarkably high. We  could not get an x-ray of the left greater trochanter done in the facility for some reason so I will order this to be [facility] so I can see it online. The patient does not complain of pain 2/17; deep pressure area on the left greater trochanter. [facility] showed no specific radiographic evidence of osteomyelitis. Unfortunately the patient fell and fractured her left arm she has a cast on her arm 2/24; deep pressure area on the left greater trochanter. This is down to periosteum in this area and probes superiorly. There is some tissue here. We have a wound VAC and I think it is time to apply it. We will see how the wound reacts to this. There was a slight odor to the wound bed and I did a culture today but no empiric antibiotics. The patient is in an [facility] living [facility] 3/3; deep pressure ulcer over the left greater trochanter. We put a wound VAC on this last week. She comes in today with a much improved wound. The wound is contracted and I think the undermining area is filled then. I could not easily detect any palpable bone. Culture from last time grew Pseudomonas and methicillin resistant staph aureus [MRSA]. This is never an easy combination. I gave her a combination of Cipro and doxycycline as she was allergic to Bactrim. Both of these will be for 7 days. 3/10; patient is completing her antibiotics. She does not appear to require any additional antibiotics. Wound orifice continues to contract there is a lot of this that is granulated however significant tunneling from  9-3 o'clock 3/24; 2-week follow-up. The patient completed her antibiotics. We are using silver collagen under the [facility] 4/7; 2-week follow-up. The patient has a smaller wound although that she still has the tunneling area from 10:00. This is certainly come down in size although there is still depth and undermining we are using collagen under the wound VAC. oShe  has painful area just below her antecubital fossa on the left which was under her cast. I think this is a small thrombosed superficial vein. I do not think this needs specific treatment 4/21; 2-week follow-up. The patient has a smaller wound by quite a bit. She still has a tunneling area at 11:00 which you can fully expose the depth of by pulling the tissue superiorly. 5/5; 2-week follow-up. The wound is come down by quite a bit. She still has the tunneling area at 11:00 that I measured today at 5 cm. There is some sanguinous drainage but no clear evidence of infection. We have been using silver collagen we will change this today. I had put the wound VAC on hold 2 weeks ago I think this can be discontinued clearly at this point 5/19; 2-week follow-up. Tunneling area today was measured at 1 cm a dramatic improvement from 2 weeks ago almost surprisingly so there is no drainage no hyper granulation. We have been using iodoform packing with covering calcium alginate. 6/2; 2-week follow-up. 0.7 cm in the small tunneling area in the middle of this wound there is very minimal undermining from 11-2 0.1 however this does not really concern me. There is no drainage. No deeply probable depth. We have been using iodoform packing covered with silver alginate Electronic Signature(s) Signed: 10/22/2019 4:41:32 PM By: Baltazar Najjar MD Entered By: Baltazar Najjar on 10/22/2019 10:14:39 Alice Wallace, Alice Wallace ([id number]) -------------------------------------------------------------------------------- Physical Exam Details Patient Name: Alice Wallace Date of Service: 10/22/2019 9:30 AM Medical Record Number: [id number] Patient Account Number: [id number] Date of Birth/Sex: 20-Mar-1941 (79 y.o. F) Treating RN: Huel Coventry Primary Care Provider: Devoria Glassing Other Clinician: Referring Provider: Devoria Glassing Treating Provider/Extender: Altamese [place] in Treatment: 17 Constitutional Sitting or standing Blood  Pressure is within target range for patient.. Pulse regular and within target range for patient.Marland Kitchen Respirations regular, non- labored and within target range.. Temperature is normal and within the target range for the patient.Marland Kitchen appears in no distress. Notes Wound exam; much of this continues to look considerably better. Tunneling depth down to 0.7 cm in the middle of this wound. There is no surrounding erythema. No palpable deep structures no tenderness and no purulent drainage Electronic Signature(s) Signed: 10/22/2019 4:41:32 PM By: Baltazar Najjar MD Entered By: Baltazar Najjar on 10/22/2019 10:15:32 Alice Wallace, Alice Wallace ([id number]) -------------------------------------------------------------------------------- Physician Orders Details Patient Name: Alice Wallace Date of Service: 10/22/2019 9:30 AM Medical Record Number: [id number] Patient Account Number: [id number] Date of Birth/Sex: 07-19-1940 (79 y.o. F) Treating RN: Huel Coventry Primary Care Provider: Devoria Glassing Other Clinician: Referring Provider: Devoria Glassing Treating Provider/Extender: Altamese [place] in Treatment: 17 Verbal / Phone Orders: No Diagnosis Coding Wound Cleansing Wound #1 Left Trochanter o Clean wound with Normal Saline. Anesthetic (add to Medication List) Wound #1 Left Trochanter o Topical Lidocaine 4% cream applied to wound bed prior to debridement (In Clinic Only). Primary Wound Dressing Wound #1 Left Trochanter o Silver Alginate - over packing strip, o Iodoform packing Gauze - 1/4 packing strip lightly into wound (11:00 is 0.7cm deep) Secondary Dressing Wound #1 Left Trochanter o Boardered Foam Dressing Dressing Change Frequency Wound #1  Left Trochanter o Change Dressing Monday, Wednesday, Friday Follow-up Appointments Wound #1 Left Trochanter o Return Appointment in 2 weeks. Off-Loading Wound #1 Left Trochanter o Turn and reposition every 2 hours o Other: - no pressure on  wounded area Additional Orders / Instructions Wound #1 Left Trochanter o Increase protein intake. o Activity as tolerated Home Health Wound #1 Left Trochanter o Continue Home Health Visits - [facility]: o Home Health Nurse may visit PRN to address patientos wound care needs. o FACE TO FACE ENCOUNTER: MEDICARE and MEDICAID PATIENTS: I certify that this patient is under my care and that I had a face-to-face encounter that meets the physician face-to-face encounter requirements with this patient on this date. The encounter with the patient was in whole or in part for the following MEDICAL CONDITION: (primary reason for Home Healthcare) MEDICAL NECESSITY: I certify, that based on my findings, NURSING services are a medically necessary home health service. HOME BOUND STATUS: I certify that my clinical findings support that this patient is homebound (i.e., Due to illness or injury, pt requires aid of supportive devices such as crutches, cane, wheelchairs, walkers, the use of special transportation or the assistance of another person to leave their place of residence. There is a normal inability to leave the home and doing so requires considerable and taxing effort. Other absences are for medical reasons / religious services and are infrequent or of short duration when for other reasons). o If current dressing causes regression in wound condition, may D/C ordered dressing product/s and apply Normal Saline Moist Dressing daily until next Wound Healing Center / Other MD appointment. Notify Wound Healing Center of regression in wound condition at [phone]. o Please direct any NON-WOUND related issues/requests for orders to patient's Primary Care Physician [place], [place] ([phone]) Electronic Signature(s) Signed: 10/22/2019 4:41:32 PM By: Baltazar Najjar MD Signed: 10/23/2019 1:40:27 PM By: Elliot Gurney, BSN, RN, CWS, Kim RN, BSN Entered By: Elliot Gurney, BSN, RN, CWS, Kim on 10/22/2019  10:02:37 Alice Wallace ([id number]) -------------------------------------------------------------------------------- Problem List Details Patient Name: Alice Wallace Date of Service: 10/22/2019 9:30 AM Medical Record Number: [id number] Patient Account Number: [id number] Date of Birth/Sex: [birth year] (79 y.o. F) Treating RN: Huel Coventry Primary Care Provider: Devoria Glassing Other Clinician: Referring Provider: Devoria Glassing Treating Provider/Extender: Altamese [place] in Treatment: 17 Active Problems ICD-10 Encounter Code Description Active Date MDM Diagnosis L89.214 Pressure ulcer of right hip, stage 4 06/25/2019 No Yes Z86.16 Personal history of COVID-19 06/25/2019 No Yes Inactive Problems Resolved Problems Electronic Signature(s) Signed: 10/22/2019 4:41:32 PM By: Baltazar Najjar MD Entered By: Baltazar Najjar on 10/22/2019 10:12:47 Alice Wallace, Alice Wallace ([id number]) -------------------------------------------------------------------------------- Progress Note Details Patient Name: Alice Wallace Date of Service: 10/22/2019 9:30 AM Medical Record Number: [id number] Patient Account Number: [id number] Date of Birth/Sex: May 01, 1941 (79 y.o. F) Treating RN: Huel Coventry Primary Care Provider: Devoria Glassing Other Clinician: Referring Provider: Devoria Glassing Treating Provider/Extender: Altamese [place] in Treatment: 17 Subjective History of Present Illness (HPI) ADMISSION 06/25/2019 This is a 79 year old woman who comes from the [facility] facility to [place] today for review of a pressure ulcer on her left hip. I really do not know much about this and I am not exactly certain how long this is been present. The patient was hospitalized from 1/16 through 1/19 at [facility] with Covid pneumonia all ended she is signed out is having a pressure ulcer on the left ischial tuberosity although there is very little information on this. She comes with an attendant from the facility. She  is deaf,  can write minimally but she can read lips. Past medical history includes interstitial lung disease, L1 compression fracture, deafness, COPD, Covid pneumonia as described. Socially she has a DSS Education officer, museum we were not able to contact her before doing this review. My interaction with the patient and allowing her to read my lips I felt that the patient understood what I was saying about her wounds and the need for debridement. I therefore went ahead and treated these aggressively in the clinic. 2/10; patient came in with a necrotic wound on her left greater trochanter last week. Extensive debridement we use Santyl. The wound is cleaned up quite nicely but goes down to periosteum. Lab work I ordered last week showed a white count of 9.3 with a normal differential hemoglobin 8.9. CMP was normal other than an albumin of 3. Her sedimentation rate was 24 and C-reactive protein 19. These are not remarkably high. We could not get an x-ray of the left greater trochanter done in the facility for some reason so I will order this to be done in the [facility] facility so I can see it online. The patient does not complain of pain 2/17; deep pressure area on the left greater trochanter. The x-ray we obtained at [facility] showed no specific radiographic evidence of osteomyelitis. Unfortunately the patient fell and fractured her left arm she has a cast on her arm 2/24; deep pressure area on the left greater trochanter. This is down to periosteum in this area and probes superiorly. There is some tissue here. We have a wound VAC and I think it is time to apply it. We will see how the wound reacts to this. There was a slight odor to the wound bed and I did a culture today but no empiric antibiotics. The patient is in an [facility] living [facility] 3/3; deep pressure ulcer over the left greater trochanter. We put a wound VAC on this last week. She comes in today with a much improved wound. The wound is  contracted and I think the undermining area is filled then. I could not easily detect any palpable bone. Culture from last time grew Pseudomonas and methicillin resistant staph aureus [MRSA]. This is never an easy combination. I gave her a combination of Cipro and doxycycline as she was allergic to Bactrim. Both of these will be for 7 days. 3/10; patient is completing her antibiotics. She does not appear to require any additional antibiotics. Wound orifice continues to contract there is a lot of this that is granulated however significant tunneling from 9-3 o'clock 3/24; 2-week follow-up. The patient completed her antibiotics. We are using silver collagen under the [facility] 4/7; 2-week follow-up. The patient has a smaller wound although that she still has the tunneling area from 10:00. This is certainly come down in size although there is still depth and undermining we are using collagen under the wound VAC. She has painful area just below her antecubital fossa on the left which was under her cast. I think this is a small thrombosed superficial vein. I do not think this needs specific treatment 4/21; 2-week follow-up. The patient has a smaller wound by quite a bit. She still has a tunneling area at 11:00 which you can fully expose the depth of by pulling the tissue superiorly. 5/5; 2-week follow-up. The wound is come down by quite a bit. She still has the tunneling area at 11:00 that I measured today at 5 cm. There is some  sanguinous drainage but no clear evidence of infection. We have been using silver collagen we will change this today. I had put the wound VAC on hold 2 weeks ago I think this can be discontinued clearly at this point 5/19; 2-week follow-up. Tunneling area today was measured at 1 cm a dramatic improvement from 2 weeks ago almost surprisingly so there is no drainage no hyper granulation. We have been using iodoform packing with covering calcium alginate. 6/2; 2-week follow-up. 0.7 cm in  the small tunneling area in the middle of this wound there is very minimal undermining from 11-2 0.1 however this does not really concern me. There is no drainage. No deeply probable depth. We have been using iodoform packing covered with silver alginate Objective Constitutional Sitting or standing Blood Pressure is within target range for patient.. Pulse regular and within target range for patient.Marland Kitchen Respirations regular, non- labored and within target range.. Temperature is normal and within the target range for the patient.Marland Kitchen appears in no distress. Vitals Time Taken: 9:37 AM, Height: 64 in, Weight: 120 lbs, BMI: 20.6, Temperature: 98.4 F, Pulse: 84 bpm, Respiratory Rate: 18 breaths/min, Blood Pressure: 140/55 mmHg. Alice Wallace, Alice Wallace ([id number]) General Notes: Wound exam; much of this continues to look considerably better. Tunneling depth down to 0.7 cm in the middle of this wound. There is no surrounding erythema. No palpable deep structures no tenderness and no purulent drainage Integumentary (Hair, Skin) Wound #1 status is Open. Original cause of wound was Pressure Injury. The wound is located on the Left Trochanter. The wound measures 0.5cm length x 0.5cm width x 0.2cm depth; 0.196cm^2 area and 0.039cm^3 volume. There is Fat Layer (Subcutaneous Tissue) Exposed exposed. There is no tunneling noted, however, there is undermining starting at 11:00 and ending at 2:00 with a maximum distance of 0.7cm. There is a medium amount of serous drainage noted. Foul odor after cleansing was noted. The wound margin is flat and intact. There is large (67-100%) red granulation within the wound bed. There is no necrotic tissue within the wound bed. Assessment Active Problems ICD-10 Pressure ulcer of right hip, stage 4 Personal history of COVID-19 Plan Wound Cleansing: Wound #1 Left Trochanter: Clean wound with Normal Saline. Anesthetic (add to Medication List): Wound #1 Left Trochanter: Topical  Lidocaine 4% cream applied to wound bed prior to debridement (In Clinic Only). Primary Wound Dressing: Wound #1 Left Trochanter: Silver Alginate - over packing strip, Iodoform packing Gauze - 1/4 packing strip lightly into wound (11:00 is 0.7cm deep) Secondary Dressing: Wound #1 Left Trochanter: Boardered Foam Dressing Dressing Change Frequency: Wound #1 Left Trochanter: Change Dressing Monday, Wednesday, Friday Follow-up Appointments: Wound #1 Left Trochanter: Return Appointment in 2 weeks. Off-Loading: Wound #1 Left Trochanter: Turn and reposition every 2 hours Other: - no pressure on wounded area Additional Orders / Instructions: Wound #1 Left Trochanter: Increase protein intake. Activity as tolerated Home Health: Wound #1 Left Trochanter: Continue Home Health Visits - [facility]: Home Health Nurse may visit PRN to address patient s wound care needs. FACE TO FACE ENCOUNTER: MEDICARE and MEDICAID PATIENTS: I certify that this patient is under my care and that I had a face-to-face encounter that meets the physician face-to-face encounter requirements with this patient on this date. The encounter with the patient was in whole or in part for the following MEDICAL CONDITION: (primary reason for Home Healthcare) MEDICAL NECESSITY: I certify, that based on my findings, NURSING services are a medically necessary home health service. HOME BOUND STATUS: I certify that  my clinical findings support that this patient is homebound (i.e., Due to illness or injury, pt requires aid of supportive devices such as crutches, cane, wheelchairs, walkers, the use of special transportation or the assistance of another person to leave their place of residence. There is a normal inability to leave the home and doing so requires considerable and taxing effort. Other absences are for medical reasons / religious services and are infrequent or of short duration when for other reasons). If current dressing  causes regression in wound condition, may D/C ordered dressing product/s and apply Normal Saline Moist Dressing daily until next Wound Healing Center / Other MD appointment. Notify Wound Healing Center of regression in wound condition at [phone]. Please direct any NON-WOUND related issues/requests for orders to patient's Primary Care Physician Alice Wallace, Alice Wallace ([id number]) 1. I have not change the dressing which is iodoform packing strip covered with alginate 2. Follow-up 2 weeks Electronic Signature(s) Signed: 10/22/2019 4:41:32 PM By: Baltazar Najjar MD Entered By: Baltazar Najjar on 10/22/2019 10:16:27 Alice Wallace ([id number]) -------------------------------------------------------------------------------- SuperBill Details Patient Name: Alice Wallace Date of Service: 10/22/2019 Medical Record Number: [id number] Patient Account Number: [id number] Date of Birth/Sex: 1941-01-08 (79 y.o. F) Treating RN: Huel Coventry Primary Care Provider: Devoria Glassing Other Clinician: Referring Provider: Devoria Glassing Treating Provider/Extender: Altamese [place] in Treatment: 17 Diagnosis Coding ICD-10 Codes Code Description [phone] Pressure ulcer of right hip, stage 4 Z86.16 Personal history of COVID-19 Facility Procedures CPT4 Code: [id number] Description: [phone] - WOUND CARE VISIT-LEV 2 EST PT Modifier: Quantity: 1 Physician Procedures CPT4 Code: [id number] Description: [id number] - WC PHYS LEVEL 3 - EST PT Modifier: Quantity: 1 CPT4 Code: Description: ICD-10 Diagnosis Description 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

## 2019-10-23 NOTE — Progress Notes (Signed)
SRITHA, CHAUNCEY ([id number]) Visit Report for 10/22/2019 Arrival Information Details Patient Name: Alice Wallace, Alice Wallace Date of Service: 10/22/2019 9:30 AM Medical Record Number: [id number] Patient Account Number: [id number] Date of Birth/Sex: 01/15/1941 (79 y.o. F) Treating RN: Curtis Sites Primary Care Kylan Liberati: Devoria Glassing Other Clinician: Referring Tauna Macfarlane: Devoria Glassing Treating Kel Senn/Extender: Altamese [place] in Treatment: 17 Visit Information History Since Last Visit Added or deleted any medications: No Patient Arrived: Walker Any new allergies or adverse reactions: No Arrival Time: 09:36 Had a fall or experienced change in No Accompanied By: caregiver activities of daily living that may affect Transfer Assistance: None risk of falls: Patient Identification Verified: Yes Signs or symptoms of abuse/neglect since last visito No Secondary Verification Process Completed: Yes Hospitalized since last visit: No Implantable device outside of the clinic excluding No cellular tissue based products placed in the center since last visit: Has Dressing in Place as Prescribed: Yes Pain Present Now: No Electronic Signature(s) Signed: 10/22/2019 4:33:52 PM By: Curtis Sites Entered By: Curtis Sites on 10/22/2019 09:37:08 Salas, Davy ([id number]) -------------------------------------------------------------------------------- Clinic Level of Care Assessment Details Patient Name: Alice Wallace Date of Service: 10/22/2019 9:30 AM Medical Record Number: [id number] Patient Account Number: [id number] Date of Birth/Sex: 1941/01/24 (78 y.o. F) Treating RN: Huel Coventry Primary Care Ashara Lounsbury: Devoria Glassing Other Clinician: Referring Ilham Roughton: Devoria Glassing Treating Danali Marinos/Extender: Altamese [place] in Treatment: 17 Clinic Level of Care Assessment Items TOOL 4 Quantity Score []  - Use when only an EandM is performed on FOLLOW-UP visit 0 ASSESSMENTS - Nursing Assessment /  Reassessment X - Reassessment of Co-morbidities (includes updates in patient status) 1 10 X- 1 5 Reassessment of Adherence to Treatment Plan ASSESSMENTS - Wound and Skin Assessment / Reassessment X - Simple Wound Assessment / Reassessment - one wound 1 5 []  - 0 Complex Wound Assessment / Reassessment - multiple wounds []  - 0 Dermatologic / Skin Assessment (not related to wound area) ASSESSMENTS - Focused Assessment []  - Circumferential Edema Measurements - multi extremities 0 []  - 0 Nutritional Assessment / Counseling / Intervention []  - 0 Lower Extremity Assessment (monofilament, tuning fork, pulses) []  - 0 Peripheral Arterial Disease Assessment (using hand held doppler) ASSESSMENTS - Ostomy and/or Continence Assessment and Care []  - Incontinence Assessment and Management 0 []  - 0 Ostomy Care Assessment and Management (repouching, etc.) PROCESS - Coordination of Care X - Simple Patient / Family Education for ongoing care 1 15 []  - 0 Complex (extensive) Patient / Family Education for ongoing care []  - 0 Staff obtains , Records, Test Results / Process Orders []  - 0 Staff telephones HHA, Nursing Homes / Clarify orders / etc []  - 0 Routine Transfer to another Facility (non-emergent condition) []  - 0 Routine Hospital Admission (non-emergent condition) []  - 0 New Admissions / / Ordering NPWT, Apligraf, etc. []  - 0 Emergency Hospital Admission (emergent condition) X- 1 10 Simple Discharge Coordination []  - 0 Complex (extensive) Discharge Coordination PROCESS - Special Needs []  - Pediatric / Minor Patient Management 0 []  - 0 Isolation Patient Management []  - 0 Hearing / Language / Visual special needs []  - 0 Assessment of Community assistance (transportation, D/C planning, etc.) []  - 0 Additional assistance / Altered mentation []  - 0 Support Surface(s) Assessment (bed, cushion, seat, etc.) INTERVENTIONS - Wound Cleansing /  Measurement Alice Wallace, Alice Wallace ( ) X- 1 5 Simple Wound Cleansing - one wound []  - 0 Complex Wound Cleansing - multiple wounds X- 1 5 Wound Imaging (photographs - any number of wounds) []  -  0 Wound Tracing (instead of photographs) X- 1 5 Simple Wound Measurement - one wound []  - 0 Complex Wound Measurement - multiple wounds INTERVENTIONS - Wound Dressings X - Small Wound Dressing one or multiple wounds 1 10 []  - 0 Medium Wound Dressing one or multiple wounds []  - 0 Large Wound Dressing one or multiple wounds []  - 0 Application of Medications - topical []  - 0 Application of Medications - injection INTERVENTIONS - Miscellaneous []  - External ear exam 0 []  - 0 Specimen Collection (cultures, biopsies, blood, body fluids, etc.) []  - 0 Specimen(s) / Culture(s) sent or taken to Lab for analysis []  - 0 Patient Transfer (multiple staff / / Similar devices) []  - 0 Simple Staple / Suture removal (25 or less) []  - 0 Complex Staple / Suture removal (26 or more) []  - 0 Hypo / Hyperglycemic Management (close monitor of Blood Glucose) []  - 0 Ankle / Brachial Index (ABI) - do not check if billed separately X- 1 5 Vital Signs Has the patient been seen at the hospital within the last three years: Yes Total Score: 75 Level Of Care: New/Established - Level 2 Electronic Signature(s) Signed: 10/23/2019 1:40:27 PM By: , BSN, RN, CWS, Kim RN, BSN Entered By: , BSN, RN, CWS, Kim on 10/22/2019 10:03:24 ( ) -------------------------------------------------------------------------------- Encounter Discharge Information Details Patient Name: Date of Service: 10/22/2019 9:30 AM Medical Record Number: Nurse, adult Patient Account Number: Date of Birth/Sex: [birth year] (78 y.o. F) Treating RN: Primary Care Viraj Liby: 12/23/2019 Other Clinician: Referring Valene Villa: Elliot Gurney Treating Darianne Muralles/Extender:  Elliot Gurney in Treatment: 17 Encounter Discharge Information Items Discharge Condition: Stable Ambulatory Status: Walker Discharge Destination: Home Transportation: Private Auto Accompanied By: caregiver Schedule Follow-up Appointment: Yes Clinical Summary of Care: Electronic Signature(s) Signed: 10/23/2019 1:40:27 PM By: [id number], BSN, RN, CWS, Kim RN, BSN Entered By: Alice Wallace, BSN, RN, CWS, Kim on 10/22/2019 10:04:24 [id number] ([id number]) -------------------------------------------------------------------------------- Lower Extremity Assessment Details Patient Name: 12/26/1940 Date of Service: 10/22/2019 9:30 AM Medical Record Number: Huel Coventry Patient Account Number: Devoria Glassing Date of Birth/Sex: 1941-05-20 (78 y.o. F) Treating RN: 18 Primary Care Osker Ayoub: 12/23/2019 Other Clinician: Referring Montague Corella: Elliot Gurney Treating Jodye Scali/Extender: Elliot Gurney in Treatment: 17 Electronic Signature(s) Signed: 10/22/2019 4:33:52 PM By: Alice Wallace Entered By: [id number] on 10/22/2019 09:37:23 Alice Wallace, Alice Wallace (12/22/2019) -------------------------------------------------------------------------------- Multi Wound Chart Details Patient Name: [id number] Date of Service: 10/22/2019 9:30 AM Medical Record Number: 12/26/1940 Patient Account Number: [id number] Date of Birth/Sex: 03-Oct-1940 (79 y.o. F) Treating RN: Devoria Glassing Primary Care Corderro Koloski: Altamese Braman Other Clinician: Referring Markeia Harkless: 12/22/2019 Treating Sipriano Fendley/Extender: Curtis Sites in Treatment: 17 Vital Signs Height(in): 64 Pulse(bpm): 84 Weight(lbs): 120 Blood Pressure(mmHg): 140/55 Body Mass Index(BMI): 21 Temperature(F): 98.4 Respiratory Rate(breaths/min): 18 Photos: [N/A:N/A] Wound Location: Left Trochanter N/A N/A Wounding Event: Pressure Injury N/A N/A Primary Etiology: Pressure Ulcer N/A N/A Comorbid History: Anemia, Chronic Obstructive  N/A N/A Pulmonary Disease (COPD), Arrhythmia, Hypertension Date Acquired: 04/22/2019 N/A N/A Weeks of Treatment: 17 N/A N/A Wound Status: Open N/A N/A Measurements L x W x D (cm) 0.5x0.5x0.2 N/A N/A Area (cm) : 0.196 N/A N/A Volume (cm) : 0.039 N/A N/A % Reduction in Area: 98.40% N/A N/A % Reduction in Volume: 99.80% N/A N/A Starting Position 1 (o'clock): 11 Ending Position 1 (o'clock): 2 Maximum Distance 1 (cm): 0.7 Undermining: Yes N/A N/A Classification: Category/Stage III N/A N/A Exudate Amount: Medium N/A N/A Exudate Type: Serous N/A  N/A Exudate Color: amber N/A N/A Foul Odor After Cleansing: Yes N/A N/A Odor Anticipated Due to Product No N/A N/A Use: Wound Margin: Flat and Intact N/A N/A Granulation Amount: Large (67-100%) N/A N/A Granulation Quality: Red N/A N/A Necrotic Amount: None Present (0%) N/A N/A Exposed Structures: Fat Layer (Subcutaneous Tissue) N/A N/A Exposed: Yes Fascia: No Tendon: No Muscle: No Joint: No Bone: No Epithelialization: None N/A N/A Treatment Notes Wound #1 (Left Trochanter) Alice Wallace, Alice Wallace ([id number]) Notes packing strip, s cell, bordered foam dressing Electronic Signature(s) Signed: 10/22/2019 4:41:32 PM By: Linton Ham MD Entered By: Linton Ham on 10/22/2019 10:13:53 Alice Wallace, Alice Wallace ([id number]) -------------------------------------------------------------------------------- Multi-Disciplinary Care Plan Details Patient Name: Alice Wallace Date of Service: 10/22/2019 9:30 AM Medical Record Number: [id number] Patient Account Number: [id number] Date of Birth/Sex: Dec 19, 1940 (79 y.o. F) Treating RN: Cornell Barman Primary Care Italo Banton: Odessa Fleming Other Clinician: Referring Bethel Sirois: Odessa Fleming Treating Jonetta Dagley/Extender: Tito Dine in Treatment: 17 Active Inactive Medication Nursing Diagnoses: Knowledge deficit related to medication safety: actual or potential Goals: Patient/caregiver will demonstrate  understanding of all current medications Date Initiated: 07/02/2019 Target Resolution Date: 07/31/2019 Goal Status: Active Interventions: Assess for medication contraindications each visit where new medications are prescribed Notes: Orientation to the Wound Care Program Nursing Diagnoses: Knowledge deficit related to the wound healing center program Goals: Patient/caregiver will verbalize understanding of the [place] Date Initiated: 07/02/2019 Target Resolution Date: 07/23/2019 Goal Status: Active Interventions: Provide education on orientation to the wound center Notes: Pressure Nursing Diagnoses: Knowledge deficit related to management of pressures ulcers Goals: Patient will remain free of pressure ulcers Date Initiated: 07/02/2019 Target Resolution Date: 07/16/2019 Goal Status: Active Interventions: Provide education on pressure ulcers Notes: Wound/Skin Impairment Nursing Diagnoses: Impaired tissue integrity Knowledge deficit related to smoking impact on wound healing Goals: Patient/caregiver will verbalize understanding of skin care regimen Date Initiated: 07/02/2019 Target Resolution Date: 07/23/2019 Goal Status: JOZALYNN, NOYCE ([id number]) Ulcer/skin breakdown will have a volume reduction of 30% by week 4 Date Initiated: 07/02/2019 Target Resolution Date: 07/30/2019 Goal Status: Active Interventions: Assess ulceration(s) every visit Provide education on ulcer and skin care Notes: Electronic Signature(s) Signed: 10/23/2019 1:40:27 PM By: Gretta Cool, BSN, RN, CWS, Kim RN, BSN Entered By: Gretta Cool, BSN, RN, CWS, Kim on 10/22/2019 10:01:34 Alice Wallace ([id number]) -------------------------------------------------------------------------------- Pain Assessment Details Patient Name: Alice Wallace Date of Service: 10/22/2019 9:30 AM Medical Record Number: [id number] Patient Account Number: [id number] Date of Birth/Sex: [birth year] (78 y.o. F) Treating RN:  Montey Hora Primary Care Jefferie Holston: Odessa Fleming Other Clinician: Referring Jasminemarie Sherrard: Odessa Fleming Treating Tahjae Durr/Extender: Tito Dine in Treatment: 17 Active Problems Location of Pain Severity and Description of Pain Patient Has Paino No Site Locations Pain Management and Medication Current Pain Management: Electronic Signature(s) Signed: 10/22/2019 4:33:52 PM By: Montey Hora Entered By: Montey Hora on 10/22/2019 09:37:15 Alice Wallace, Alice Wallace ([id number]) -------------------------------------------------------------------------------- Patient/Caregiver Education Details Patient Name: Alice Wallace Date of Service: 10/22/2019 9:30 AM Medical Record Number: [id number] Patient Account Number: [id number] Date of Birth/Gender: September 08, 1940 (79 y.o. F) Treating RN: Cornell Barman Primary Care Physician: Odessa Fleming Other Clinician: Referring Physician: Odessa Fleming Treating Physician/Extender: Tito Dine in Treatment: 17 Education Assessment Education Provided To: Caregiver Education Topics Provided Wound/Skin Impairment: Handouts: Caring for Your Ulcer Methods: Demonstration, Explain/Verbal Responses: State content correctly Electronic Signature(s) Signed: 10/23/2019 1:40:27 PM By: Gretta Cool, BSN, RN, CWS, Kim RN, BSN Entered By: Gretta Cool, BSN, RN, CWS, Kim on 10/22/2019 10:03:54 Alice Wallace ([id number]) -------------------------------------------------------------------------------- Wound Assessment Details Patient Name: Alice Wallace  Date of Service: 10/22/2019 9:30 AM Medical Record Number: [id number] Patient Account Number: [id number] Date of Birth/Sex: 1940-07-28 (79 y.o. F) Treating RN: Curtis Sites Primary Care Cassell Voorhies: Devoria Glassing Other Clinician: Referring Paiton Boultinghouse: Devoria Glassing Treating Ajani Schnieders/Extender: Altamese [place] in Treatment: 17 Wound Status Wound Number: 1 Primary Pressure Ulcer Etiology: Wound Location: Left  Trochanter Wound Status: Open Wounding Event: Pressure Injury Comorbid Anemia, Chronic Obstructive Pulmonary Disease Date Acquired: 04/22/2019 History: (COPD), Arrhythmia, Hypertension Weeks Of Treatment: 17 Clustered Wound: No Photos Wound Measurements Length: (cm) 0.5 % Re Width: (cm) 0.5 % Re Depth: (cm) 0.2 Epit Area: (cm) 0.196 Tun Volume: (cm) 0.039 Und S E M duction in Area: 98.4% duction in Volume: 99.8% helialization: None neling: No ermining: Yes tarting Position (o'clock): 11 nding Position (o'clock): 2 aximum Distance: (cm) 0.7 Wound Description Classification: Category/Stage III Foul Wound Margin: Flat and Intact Due Exudate Amount: Medium Slou Exudate Type: Serous Exudate Color: amber Odor After Cleansing: Yes to Product Use: No gh/Fibrino Yes Wound Bed Granulation Amount: Large (67-100%) Exposed Structure Granulation Quality: Red Fascia Exposed: No Necrotic Amount: None Present (0%) Fat Layer (Subcutaneous Tissue) Exposed: Yes Tendon Exposed: No Muscle Exposed: No Joint Exposed: No Bone Exposed: No Treatment Notes Wound #1 (Left Trochanter) Alice Wallace, Alice Wallace ([id number]) Notes packing strip, s cell, bordered foam dressing Electronic Signature(s) Signed: 10/22/2019 4:33:52 PM By: Curtis Sites Signed: 10/23/2019 1:40:27 PM By: Elliot Gurney, BSN, RN, CWS, Kim RN, BSN Entered By: Elliot Gurney, BSN, RN, CWS, Kim on 10/22/2019 10:01:26 Alice Wallace ([id number]) -------------------------------------------------------------------------------- Vitals Details Patient Name: Alice Wallace Date of Service: 10/22/2019 9:30 AM Medical Record Number: [id number] Patient Account Number: [id number] Date of Birth/Sex: 11-25-1940 (79 y.o. F) Treating RN: Curtis Sites Primary Care Laneah Luft: Devoria Glassing Other Clinician: Referring Malyk Girouard: Devoria Glassing Treating Velton Roselle/Extender: Altamese  in Treatment: 17 Vital Signs Time Taken: 09:37 Temperature (F):  98.4 Height (in): 64 Pulse (bpm): 84 Weight (lbs): 120 Respiratory Rate (breaths/min): 18 Body Mass Index (BMI): 20.6 Blood Pressure (mmHg): 140/55 Reference Range: 80 - 120 mg / dl Electronic Signature(s) Signed: 10/22/2019 4:33:52 PM By: Curtis Sites Entered By: Curtis Sites on 10/22/2019 09:38:03

## 2019-11-05 ENCOUNTER — Encounter: Payer: Medicare Other | Admitting: Internal Medicine

## 2019-11-05 ENCOUNTER — Other Ambulatory Visit: Payer: Self-pay

## 2019-11-05 DIAGNOSIS — L89214 Pressure ulcer of right hip, stage 4: Secondary | ICD-10-CM | POA: Diagnosis not present

## 2019-11-05 NOTE — Progress Notes (Signed)
Alice Wallace, Alice Wallace ([id number]) Visit Report for 11/05/2019 HPI Details Patient Name: Alice Wallace, Alice Wallace Date of Service: 11/05/2019 9:30 AM Medical Record Number: [id number] Patient Account Number: [id number] Date of Birth/Sex: 18-Apr-1941 (79 y.o. F) Treating RN: Alice Wallace Primary Care Provider: Devoria Wallace Other Clinician: Referring Provider: Devoria Wallace Treating Provider/Extender: Alice Wallace in Treatment: 19 History of Present Illness HPI Description: ADMISSION 06/25/2019 This is a 79 year old woman who comes from the [facility] skilled facility to [place] today for review of a pressure ulcer on her left hip. I really do not know much about this and I am not exactly certain how long this is been present. The patient was hospitalized from 1/16 through 1/19 at [facility] with Covid pneumonia all ended she is signed out is having a pressure ulcer on the left ischial tuberosity although there is very little information on this. She comes with an attendant from the facility. 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 Past medical history includes interstitial lung disease, L1 compression fracture, deafness, COPD, Covid pneumonia as described. Socially she has a DSS Child psychotherapist we were not able to contact her before doing this review. My interaction with the patient and allowing her to read my lips I felt that the patient understood what I was saying about her wounds and the need for debridement. I therefore went ahead and treated these aggressively in the clinic. 2/10; patient came in with a necrotic wound on her left greater trochanter last week. Extensive debridement we use Santyl. The wound is cleaned up quite nicely but goes down to periosteum. Lab work I ordered last week showed a white count of 9.3 with a normal differential hemoglobin 8.9. CMP was normal other than an albumin of 3. Her sedimentation rate was 24 and C-reactive protein 19. These are not remarkably high. We  could not get an x-ray of the left greater trochanter done in the facility for some reason so I will order this to be done in the [facility] facility so I can see it online. The patient does not complain of pain 2/17; deep pressure area on the left greater trochanter. The x-ray we obtained at [facility] health showed no specific radiographic evidence of osteomyelitis. Unfortunately the patient fell and fractured her left arm she has a cast on her arm 2/24; deep pressure area on the left greater trochanter. This is down to periosteum in this area and probes superiorly. There is some tissue here. We have a wound VAC and I think it is time to apply it. We will see how the wound reacts to this. There was a slight odor to the wound bed and I did a culture today but no empiric antibiotics. The patient is in an assisted living [facility] 3/3; deep pressure ulcer over the left greater trochanter. We put a wound VAC on this last week. She comes in today with a much improved wound. The wound is contracted and I think the undermining area is filled then. I could not easily detect any palpable bone. Culture from last time grew Pseudomonas and methicillin resistant staph aureus [MRSA]. This is never an easy combination. I gave her a combination of Cipro and doxycycline as she was allergic to Bactrim. Both of these will be for 7 days. 3/10; patient is completing her antibiotics. She does not appear to require any additional antibiotics. Wound orifice continues to contract there is a lot of this that is granulated however significant tunneling from  9-3 o'clock 3/24; 2-week follow-up. The patient completed her antibiotics. [facility]; 2-week follow-up. The patient has a smaller wound although that she still has the tunneling area from 10:00. This is certainly come down in size although there is still depth and undermining we are using collagen under the wound VAC. oShe  has painful area just below her antecubital fossa on the left which was under her cast. I think this is a small thrombosed superficial vein. I do not think this needs specific treatment 4/21; 2-week follow-up. The patient has a smaller wound by quite a bit. She still has a tunneling area at 11:00 which you can fully expose the depth of by pulling the tissue superiorly. 5/5; 2-week follow-up. The wound is come down by quite a bit. She still has the tunneling area at 11:00 that I measured today at 5 cm. There is some sanguinous drainage but no clear evidence of infection. We have been using silver collagen we will change this today. I had put the wound VAC on hold 2 weeks ago I think this can be discontinued clearly at this point 5/19; 2-week follow-up. Tunneling area today was measured at 1 cm a dramatic improvement from 2 weeks ago almost surprisingly so there is no drainage no hyper granulation. We have been using iodoform packing with covering calcium alginate. 6/2; 2-week follow-up. 0.7 cm in the small tunneling area in the middle of this wound there is very minimal undermining from 11-2 0.1 however this does not really concern me. There is no drainage. No deeply probable depth. 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 No underlying osteomyelitis. This required extensive debridement and then a prolonged period of time and a wound VAC. Most recently we have been using iodoform packing with silver alginate cover and this is been closing down. Electronic Signature(s) Signed: 11/05/2019 4:26:25 PM By: Alice Wallace, Alice Rademaker MD Entered By: Alice Wallace, Vernisha Bacote on 11/05/2019 09:54:13 Alice Wallace ([id number]) -------------------------------------------------------------------------------- Physical Exam Details Patient Name: Alice Wallace, Alice Date of Service: 11/05/2019 9:30 AM Medical Record Number:  [id number] Patient Account Number: [id number] Date of Birth/Sex: 01/31/1941 (79 y.o. F) Treating RN: Alice CoventryWoody, Kim Primary Care Provider: Devoria GlassingLAMBERT, TRACEY Other Clinician: Referring Provider: Devoria GlassingLAMBERT, TRACEY Treating Provider/Extender: Alice CarolinaOBSON, Harvard Zeiss G Weeks in Treatment: 19 Constitutional Patient is hypertensive.. Pulse regular and within target range for patient.Marland Kitchen. Respirations regular, non-labored and within target range.. Temperature is normal and within the target range for the patient.Marland Kitchen. appears in no distress. Notes Wound exam; the wound is fully epithelialized. There is no surrounding tenderness. She is healed with skin over bone this will always be a vulnerable area and I showed her this. Electronic Signature(s) Signed: 11/05/2019 4:26:25 PM By: Alice Wallace, Jailan Trimm MD Entered By: Alice Wallace, Tish Begin on 11/05/2019 09:55:16 [place], Alice Wallace ([id number]) -------------------------------------------------------------------------------- Physician Orders Details Patient Name: Alice Wallace, Alice Date of Service: 11/05/2019 9:30 AM Medical Record Number: [id number] Patient Account Number: [id number] Date of Birth/Sex: 06/09/1940 (79 y.o. F) Treating RN: Alice CoventryWoody, Kim Primary Care Provider: Devoria GlassingLAMBERT, TRACEY Other Clinician: Referring Provider: Devoria GlassingLAMBERT, TRACEY Treating Provider/Extender: Alice CarolinaOBSON, Bradie Lacock G Weeks in Treatment: 1419 Verbal / Phone Orders: No Diagnosis Coding Home Health o D/C Home Health Services [facility]charge from Wound Care Center - treatment complete Electronic Signature(s) Signed: 11/05/2019 4:26:25 PM By: Alice Wallace, Nessa Ramaker MD Signed: 11/05/2019 5:04:05 PM By: Elliot GurneyWoody, BSN, RN, CWS, Kim RN, BSN Entered By: Elliot GurneyWoody,  BSN, RN, CWS, Kim on 11/05/2019 09:52:18 Alice Wallace, Alice Wallace ([id number]) -------------------------------------------------------------------------------- Problem List Details Patient Name: SHANTINIQUE, PICAZO Date of Service: 11/05/2019 9:30 AM Medical Record Number:  [id number] Patient Account Number: [id number] Date of Birth/Sex: 1941/01/28 (79 y.o. F) Treating RN: Cornell Barman Primary Care Provider: Odessa Fleming Other Clinician: Referring Provider: Odessa Fleming Treating Provider/Extender: Tito Dine in Treatment: 19 Active Problems ICD-10 Encounter Code Description Active Date MDM Diagnosis L89.214 Pressure ulcer of right hip, stage 4 06/25/2019 No Yes Z86.16 Personal history of COVID-19 06/25/2019 No Yes Inactive Problems Resolved Problems Electronic Signature(s) Signed: 11/05/2019 4:26:25 PM By: Linton Ham MD Entered By: Linton Ham on 11/05/2019 09:52:35 Alice Wallace, Alice Wallace ([id number]) -------------------------------------------------------------------------------- Progress Note Details Patient Name: Alice Wallace Date of Service: 11/05/2019 9:30 AM Medical Record Number: [id number] Patient Account Number: [id number] Date of Birth/Sex: May 24, 1940 (79 y.o. F) Treating RN: Cornell Barman Primary Care Provider: Odessa Fleming Other Clinician: Referring Provider: Odessa Fleming Treating Provider/Extender: Tito Dine in Treatment: 19 Subjective History of Present Illness (HPI) ADMISSION 06/25/2019 This is a 79 year old woman who comes from the [place] skilled facility to [place] today for review of a pressure ulcer on her left hip. I really do not know much about this and I am not exactly certain how long this is been present. The patient was hospitalized from 1/16 through 1/19 at [facility] with Covid pneumonia all ended she is signed out is having a pressure ulcer on the left ischial tuberosity although there is very little information on this. She comes with an attendant from the facility. 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 Past medical history includes interstitial lung disease, L1 compression fracture, deafness, COPD, Covid pneumonia as described. Socially she has a DSS Education officer, museum we were not able  to contact her before doing this review. My interaction with the patient and allowing her to read my lips I felt that the patient understood what I was saying about her wounds and the need for debridement. I therefore went ahead and treated these aggressively in the clinic. 2/10; patient came in with a necrotic wound on her left greater trochanter last week. Extensive debridement we use Santyl. The wound is cleaned up quite nicely but goes down to periosteum. Lab work I ordered last week showed a white count of 9.3 with a normal differential hemoglobin 8.9. CMP was normal other than an albumin of 3. Her sedimentation rate was 24 and C-reactive protein 19. These are not remarkably high. We could not get an x-ray of the left greater trochanter done in the facility for some reason so I will order this to be done in the [facility] facility so I can see it online. The patient does not complain of pain 2/17; deep pressure area on the left greater trochanter. The x-ray we obtained at [facility] health showed no specific radiographic evidence of osteomyelitis. Unfortunately the patient fell and fractured her left arm she has a cast on her arm 2/24; deep pressure area on the left greater trochanter. This is down to periosteum in this area and probes superiorly. There is some tissue here. We have a wound VAC and I think it is time to apply it. We will see how the wound reacts to this. There was a slight odor to the wound bed and I did a culture today but no empiric antibiotics. The patient is in an assisted living [facility] 3/3; deep pressure ulcer over the left greater trochanter. We  put a wound VAC on this last week. She comes in today with a much improved wound. The wound is contracted and I think the undermining area is filled then. I could not easily detect any palpable bone. Culture from last time grew Pseudomonas and methicillin resistant staph aureus [MRSA]. This is never an easy combination. I  gave her a combination of Cipro and doxycycline as she was allergic to Bactrim. Both of these will be for 7 days. 3/10; patient is completing her antibiotics. She does not appear to require any additional antibiotics. Wound orifice continues to contract there is a lot of this that is granulated however significant tunneling from 9-3 o'clock 3/24; 2-week follow-up. The patient completed her antibiotics. We are using silver collagen under the [facility] 4/7; 2-week follow-up. The patient has a smaller wound although that she still has the tunneling area from 10:00. This is certainly come down in size although there is still depth and undermining we are using collagen under the wound VAC. She has painful area just below her antecubital fossa on the left which was under her cast. I think this is a small thrombosed superficial vein. I do not think this needs specific treatment 4/21; 2-week follow-up. The patient has a smaller wound by quite a bit. She still has a tunneling area at 11:00 which you can fully expose the depth of by pulling the tissue superiorly. 5/5; 2-week follow-up. The wound is come down by quite a bit. She still has the tunneling area at 11:00 that I measured today at 5 cm. There is some sanguinous drainage but no clear evidence of infection. We have been using silver collagen we will change this today. I had put the wound VAC on hold 2 weeks ago I think this can be discontinued clearly at this point 5/19; 2-week follow-up. Tunneling area today was measured at 1 cm a dramatic improvement from 2 weeks ago almost surprisingly so there is no drainage no hyper granulation. We have been using iodoform packing with covering calcium alginate. 6/2; 2-week follow-up. 0.7 cm in the small tunneling area in the middle of this wound there is very minimal undermining from 11-2 0.1 however this does not really concern me. There is no drainage. No deeply probable depth. 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 No underlying osteomyelitis. This required extensive debridement and then a prolonged period of time and a wound VAC. Most recently we have been using iodoform packing with silver alginate cover and this is been closing down. Objective Constitutional Patient is hypertensive.. Pulse regular and within target range for patient.Marland Kitchen Respirations regular, non-labored and within target range.. Temperature is normal and within the target range for the patient.Marland Kitchen appears in no distress. Alice Wallace, Alice Wallace ([id number]) Vitals Time Taken: 9:41 AM, Height: 64 in, Weight: 120 lbs, BMI: 20.6, Temperature: 98.2 F, Pulse: 87 bpm, Respiratory Rate: 18 breaths/min, Blood Pressure: 167/57 mmHg. General Notes: Wound exam; the wound is fully epithelialized. There is no surrounding tenderness. She is healed with skin over bone this will always be a vulnerable area and I showed her this. Integumentary (Hair, Skin) Wound #1 status is Open. Original cause of wound was Pressure Injury. The wound is located on the Left Trochanter. The wound measures 0cm length x 0cm width x 0cm depth; 0cm^2 area and 0cm^3 volume. The wound is limited to skin breakdown. There is no tunneling or undermining noted. There  is a none present amount of drainage noted. The wound margin is flat and intact. There is no granulation within the wound bed. There is no necrotic tissue within the wound bed. Assessment Active Problems ICD-10 Pressure ulcer of right hip, stage 4 Personal history of COVID-19 Plan Home Health: D/C Home Health Services Discharge From [facility] Services: Discharge from Wound Care Center - treatment complete 1. The patient can be discharged from the wound care center. 2. I cautioned her about offloading this area and especially if she ends up in the hospital again. The patient expressed understanding Electronic  Signature(s) Signed: 11/05/2019 4:26:25 PM By: Alice Najjar MD Entered By: Alice Najjar on 11/05/2019 09:55:56 Alice Wallace, Alice Wallace ([id number]) -------------------------------------------------------------------------------- SuperBill Details Patient Name: Alice Wallace Date of Service: 11/05/2019 Medical Record Number: [id number] Patient Account Number: [id number] Date of Birth/Sex: May 12, 1941 (79 y.o. 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

## 2019-11-05 NOTE — Progress Notes (Signed)
Alice Wallace, Alice Wallace ([id number]) Visit Report for 11/05/2019 Arrival Information Details Patient Name: Alice Wallace, Alice Wallace Date of Service: 11/05/2019 9:30 AM Medical Record Number: [id number] Patient Account Number: [id number] Date of Birth/Sex: 14-Dec-1940 (79 y.o. F) Treating RN: Alice Wallace Primary Care Alice Wallace: Alice Wallace Other Clinician: Referring Alice Wallace: Alice Wallace Treating Gemini Beaumier/Extender: Alice [place] in Treatment: 19 Visit Information History Since Last Visit Added or deleted any medications: No Patient Arrived: Walker Any new allergies or adverse reactions: No Arrival Time: 09:41 Had a fall or experienced change in No Accompanied By: caregiver activities of daily living that may affect Transfer Assistance: None risk of falls: Patient Identification Verified: Yes Signs or symptoms of abuse/neglect since last visito No Secondary Verification Process Completed: Yes Hospitalized since last visit: No Implantable device outside of the clinic excluding No cellular tissue based products placed in the center since last visit: Has Dressing in Place as Prescribed: Yes Pain Present Now: No Electronic Signature(s) Signed: 11/05/2019 4:35:33 PM By: Alice Wallace Entered By: Alice Wallace on 11/05/2019 09:41:28 Wallace, Alice ([id number]) -------------------------------------------------------------------------------- Clinic Level of Care Assessment Details Patient Name: Alice Wallace Date of Service: 11/05/2019 9:30 AM Medical Record Number: [id number] Patient Account Number: [id number] Date of Birth/Sex: [birth year] (79 y.o. F) Treating RN: Alice Wallace Primary Care Alice Wallace: Alice Wallace Other Clinician: Referring Alice Wallace: Alice Wallace Treating Alice Wallace/Extender: Alice Wallace in Treatment: 19 Clinic Level of Care Assessment Items TOOL 4 Quantity Score []  - Use when only an EandM is performed on FOLLOW-UP visit 0 ASSESSMENTS - Nursing Assessment  / Reassessment []  - Reassessment of Co-morbidities (includes updates in patient status) 0 X- 1 5 Reassessment of Adherence to Treatment Plan ASSESSMENTS - Wound and Skin Assessment / Reassessment X - Simple Wound Assessment / Reassessment - one wound 1 5 []  - 0 Complex Wound Assessment / Reassessment - multiple wounds []  - 0 Dermatologic / Skin Assessment (not related to wound area) ASSESSMENTS - Focused Assessment []  - Circumferential Edema Measurements - multi extremities 0 []  - 0 Nutritional Assessment / Counseling / Intervention []  - 0 Lower Extremity Assessment (monofilament, tuning fork, pulses) []  - 0 Peripheral Arterial Disease Assessment (using hand held doppler) ASSESSMENTS - Ostomy and/or Continence Assessment and Care []  - Incontinence Assessment and Management 0 []  - 0 Ostomy Care Assessment and Management (repouching, etc.) PROCESS - Coordination of Care X - Simple Patient / Family Education for ongoing care 1 15 []  - 0 Complex (extensive) Patient / Family Education for ongoing care X- 1 10 Staff obtains , Records, Test Results / Process Orders []  - 0 Staff telephones HHA, Nursing Homes / Clarify orders / etc []  - 0 Routine Transfer to another Facility (non-emergent condition) []  - 0 Routine Hospital Admission (non-emergent condition) []  - 0 New Admissions / / Ordering NPWT, Apligraf, etc. []  - 0 Emergency Hospital Admission (emergent condition) X- 1 10 Simple Discharge Coordination []  - 0 Complex (extensive) Discharge Coordination PROCESS - Special Needs []  - Pediatric / Minor Patient Management 0 []  - 0 Isolation Patient Management []  - 0 Hearing / Language / Visual special needs []  - 0 Assessment of Community assistance (transportation, D/C planning, etc.) []  - 0 Additional assistance / Altered mentation []  - 0 Support Surface(s) Assessment (bed, cushion, seat, etc.) INTERVENTIONS - Wound Cleansing /  Measurement Alice Wallace ( ) []  - 0 Simple Wound Cleansing - one wound []  - 0 Complex Wound Cleansing - multiple wounds X- 1 5 Wound Imaging (photographs - any number of wounds) []  -  0 Wound Tracing (instead of photographs) []  - 0 Simple Wound Measurement - one wound []  - 0 Complex Wound Measurement - multiple wounds INTERVENTIONS - Wound Dressings []  - Small Wound Dressing one or multiple wounds 0 []  - 0 Medium Wound Dressing one or multiple wounds []  - 0 Large Wound Dressing one or multiple wounds []  - 0 Application of Medications - topical []  - 0 Application of Medications - injection INTERVENTIONS - Miscellaneous []  - External ear exam 0 []  - 0 Specimen Collection (cultures, biopsies, blood, body fluids, etc.) []  - 0 Specimen(s) / Culture(s) sent or taken to Lab for analysis []  - 0 Patient Transfer (multiple staff / / Similar devices) []  - 0 Simple Staple / Suture removal (25 or less) []  - 0 Complex Staple / Suture removal (26 or more) []  - 0 Hypo / Hyperglycemic Management (close monitor of Blood Glucose) []  - 0 Ankle / Brachial Index (ABI) - do not check if billed separately X- 1 5 Vital Signs Has the patient been seen at the hospital within the last three years: Yes Total Score: 55 Level Of Care: New/Established - Level 2 Electronic Signature(s) Signed: 11/05/2019 5:04:05 PM By: , BSN, RN, CWS, Kim RN, BSN Entered By: , BSN, RN, CWS, Kim on 11/05/2019 09:54:33 ( ) -------------------------------------------------------------------------------- Encounter Discharge Information Details Patient Name: Date of Service: 11/05/2019 9:30 AM Medical Record Number: Patient Account Number: Date of Birth/Sex: [birth year] (79 y.o. F) Treating RN: Primary Care Alice Wallace: Other Clinician: Referring Alice Wallace: Treating Alice Wallace/Extender:  11/07/2019 in Treatment: 19 Encounter Discharge Information Items Discharge Condition: Stable Ambulatory Status: Walker Discharge Destination: Home Transportation: Private Auto Accompanied By: caregiver Schedule Follow-up Appointment: Yes Clinical Summary of Care: Electronic Signature(s) Signed: 11/05/2019 5:04:05 PM By: 11/07/2019, BSN, RN, CWS, Kim RN, BSN Entered By: Alice Wallace, BSN, RN, CWS, Kim on 11/05/2019 09:55:22 Alice Wallace (11/07/2019) -------------------------------------------------------------------------------- Lower Extremity Assessment Details Patient Name: [id number] Date of Service: 11/05/2019 9:30 AM Medical Record Number: [id number] Patient Account Number: [id number] Date of Birth/Sex: 08-25-1940 (79 y.o. F) Treating RN: Alice Wallace Primary Care Elener Custodio: Alice [place] Other Clinician: Referring Kallista Pae: 20 Treating Lutie Pickler/Extender: 11/07/2019 Weeks in Treatment: 19 Electronic Signature(s) Signed: 11/05/2019 4:35:33 PM By: Elliot Gurney Entered By: 11/07/2019 on 11/05/2019 09:46:39 Goerke, Zian ([id number]) -------------------------------------------------------------------------------- Multi Wound Chart Details Patient Name: Alice Wallace Date of Service: 11/05/2019 9:30 AM Medical Record Number: [id number] Patient Account Number: [id number] Date of Birth/Sex: 09/19/1940 (79 y.o. F) Treating RN: Alice Wallace Primary Care Daysha Ashmore: Alice Wallace Other Clinician: Referring Obaloluwa Delatte: Alice Wallace Treating Jeany Seville/Extender: Maxwell [place] in Treatment: 19 Vital Signs Height(in): 64 Pulse(bpm): 87 Weight(lbs): 120 Blood Pressure(mmHg): 167/57 Body Mass Index(BMI): 21 Temperature(F): 98.2 Respiratory Rate(breaths/min): 18 Photos: [N/A:N/A] Wound Location: Left Trochanter N/A N/A Wounding Event: Pressure Injury N/A N/A Primary Etiology: Pressure Ulcer N/A N/A Comorbid History: Anemia, Chronic  Obstructive N/A N/A Pulmonary Disease (COPD), Arrhythmia, Hypertension Date Acquired: 04/22/2019 N/A N/A Weeks of Treatment: 19 N/A N/A Wound Status: Open N/A N/A Measurements L x W x D (cm) 0x0x0 N/A N/A Area (cm) : 0 N/A N/A Volume (cm) : 0 N/A N/A % Reduction in Area: 100.00% N/A N/A % Reduction in Volume: 100.00% N/A N/A Classification: Category/Stage III N/A N/A Exudate Amount: None Present N/A N/A Wound Margin: Flat and Intact N/A N/A Granulation Amount: None Present (0%) N/A N/A Necrotic Amount: None Present (0%) N/A N/A Exposed Structures:  Fascia: No N/A N/A Fat Layer (Subcutaneous Tissue) Exposed: No Tendon: No Muscle: No Joint: No Bone: No Limited to Skin Breakdown Epithelialization: Large (67-100%) N/A N/A Treatment Notes Electronic Signature(s) Signed: 11/05/2019 4:26:25 PM By: Baltazar Najjar MD Entered By: Baltazar Najjar on 11/05/2019 09:52:43 Favero, Tacey ([id number]) -------------------------------------------------------------------------------- Multi-Disciplinary Care Plan Details Patient Name: Alice Wallace Date of Service: 11/05/2019 9:30 AM Medical Record Number: [id number] Patient Account Number: [id number] Date of Birth/Sex: November 20, 1940 (79 y.o. F) Treating RN: Alice Wallace Primary Care Shayana Hornstein: Alice Wallace Other Clinician: Referring Jex Strausbaugh: Alice Wallace Treating Oneal Biglow/Extender: Alice [place] in Treatment: 78 Active Inactive Electronic Signature(s) Signed: 11/05/2019 5:04:05 PM By: Elliot Gurney, BSN, RN, CWS, Kim RN, BSN Entered By: Elliot Gurney, BSN, RN, CWS, Kim on 11/05/2019 09:51:28 Alice Wallace ([id number]) -------------------------------------------------------------------------------- Pain Assessment Details Patient Name: Alice Wallace Date of Service: 11/05/2019 9:30 AM Medical Record Number: [id number] Patient Account Number: [id number] Date of Birth/Sex: 1940-10-22 (79 y.o. F) Treating RN: Alice Wallace Primary Care Renan Danese:  Alice Wallace Other Clinician: Referring Skyelar Swigart: Alice Wallace Treating Jonah Nestle/Extender: Alice [place] in Treatment: 19 Active Problems Location of Pain Severity and Description of Pain Patient Has Paino No Site Locations Pain Management and Medication Current Pain Management: Electronic Signature(s) Signed: 11/05/2019 4:35:33 PM By: Alice Wallace Entered By: Alice Wallace on 11/05/2019 09:43:01 Prieur, [place] ([id number]) -------------------------------------------------------------------------------- Patient/Caregiver Education Details Patient Name: Alice Wallace Date of Service: 11/05/2019 9:30 AM Medical Record Number: [id number] Patient Account Number: [id number] Date of Birth/Gender: [birth year] (79 y.o. F) Treating RN: Alice Wallace Primary Care Physician: Alice Wallace Other Clinician: Referring Physician: Devoria Wallace Treating Physician/Extender: Alice [place] in Treatment: 32 Education Assessment Education Provided To: Patient Education Topics Provided Wound/Skin Impairment: Handouts: Other: treatment complete Psychologist, prison and probation services) Signed: 11/05/2019 5:04:05 PM By: Elliot Gurney, BSN, RN, CWS, Kim RN, BSN Entered By: Elliot Gurney, BSN, RN, CWS, Kim on 11/05/2019 09:54:58 Alice Wallace ([id number]) -------------------------------------------------------------------------------- Wound Assessment Details Patient Name: Alice Wallace Date of Service: 11/05/2019 9:30 AM Medical Record Number: [id number] Patient Account Number: [id number] Date of Birth/Sex: 10-22-1940 (79 y.o. F) Treating RN: Alice Wallace Primary Care Mahum Betten: Alice Wallace Other Clinician: Referring Aayla Marrocco: Alice Wallace Treating Helayna Dun/Extender: Alice [place] in Treatment: 19 Wound Status Wound Number: 1 Primary Pressure Ulcer Etiology: Wound Location: Left Trochanter Wound Status: Open Wounding Event: Pressure Injury Comorbid Anemia, Chronic Obstructive  Pulmonary Disease Date Acquired: 04/22/2019 History: (COPD), Arrhythmia, Hypertension Weeks Of Treatment: 19 Clustered Wound: No Photos Wound Measurements Length: (cm) 0 Width: (cm) 0 Depth: (cm) 0 Area: (cm) 0 Volume: (cm) 0 % Reduction in Area: 100% % Reduction in Volume: 100% Epithelialization: Large (67-100%) Tunneling: No Undermining: No Wound Description Classification: Category/Stage III Wound Margin: Flat and Intact Exudate Amount: None Present Foul Odor After Cleansing: No Slough/Fibrino No Wound Bed Granulation Amount: None Present (0%) Exposed Structure Necrotic Amount: None Present (0%) Fascia Exposed: No Fat Layer (Subcutaneous Tissue) Exposed: No Tendon Exposed: No Muscle Exposed: No Joint Exposed: No Bone Exposed: No Limited to Skin Breakdown Electronic Signature(s) Signed: 11/05/2019 4:35:33 PM By: Alice Wallace Entered By: Alice Wallace on 11/05/2019 09:46:25 Weinheimer, Korynn ([id number]) -------------------------------------------------------------------------------- Vitals Details Patient Name: Alice Wallace Date of Service: 11/05/2019 9:30 AM Medical Record Number: [id number] Patient Account Number: [id number] Date of Birth/Sex: 03/31/1941 (79 y.o. F) Treating RN: Alice Wallace Primary Care Alleigh Mollica: Alice Wallace Other Clinician: Referring Mounir Skipper: Alice Wallace Treating Eliakim Tendler/Extender: Alice [place] in Treatment: 19 Vital Signs Time Taken: 09:41 Temperature (F): 98.2 Height (in): 64 Pulse (bpm): 87 Weight (lbs): 120  Respiratory Rate (breaths/min): 18 Body Mass Index (BMI): 20.6 Blood Pressure (mmHg): 167/57 Reference Range: 80 - 120 mg / dl Electronic Signature(s) Signed: 11/05/2019 4:35:33 PM By: Montey Hora Entered By: Montey Hora on 11/05/2019 09:42:55

## 2020-01-21 ENCOUNTER — Inpatient Hospital Stay (HOSPITAL_COMMUNITY)
Admit: 2020-01-21 | Discharge: 2020-01-21 | Disposition: A | Discharge status: Home / Self Care | Payer: Medicare Other | Attending: Family Medicine | Admitting: Family Medicine

## 2020-01-21 ENCOUNTER — Ambulatory Visit: Admit: 2020-01-21 | Payer: Medicare Other | Admitting: Orthopedic Surgery

## 2020-01-21 ENCOUNTER — Encounter
Admission: EM | Disposition: A | Discharge status: Skilled Nursing Facility | Payer: Self-pay | Source: Home / Self Care | Attending: Family Medicine

## 2020-01-21 ENCOUNTER — Emergency Department: Payer: Medicare Other

## 2020-01-21 ENCOUNTER — Inpatient Hospital Stay
Admission: EM | Admit: 2020-01-21 | Discharge: 2020-02-03 | DRG: 981 | Disposition: A | Discharge status: Skilled Nursing Facility | Payer: Medicare Other | Attending: Internal Medicine | Admitting: Internal Medicine

## 2020-01-21 ENCOUNTER — Other Ambulatory Visit: Payer: Self-pay

## 2020-01-21 DIAGNOSIS — I5032 Chronic diastolic (congestive) heart failure: Secondary | ICD-10-CM | POA: Diagnosis present

## 2020-01-21 DIAGNOSIS — D509 Iron deficiency anemia, unspecified: Secondary | ICD-10-CM | POA: Diagnosis present

## 2020-01-21 DIAGNOSIS — Z681 Body mass index (BMI) 19 or less, adult: Secondary | ICD-10-CM

## 2020-01-21 DIAGNOSIS — Z885 Allergy status to narcotic agent status: Secondary | ICD-10-CM

## 2020-01-21 DIAGNOSIS — M80851A Other osteoporosis with current pathological fracture, right femur, initial encounter for fracture: Secondary | ICD-10-CM | POA: Diagnosis present

## 2020-01-21 DIAGNOSIS — R0902 Hypoxemia: Secondary | ICD-10-CM

## 2020-01-21 DIAGNOSIS — Z79899 Other long term (current) drug therapy: Secondary | ICD-10-CM | POA: Diagnosis not present

## 2020-01-21 DIAGNOSIS — R6520 Severe sepsis without septic shock: Secondary | ICD-10-CM | POA: Diagnosis not present

## 2020-01-21 DIAGNOSIS — J9622 Acute and chronic respiratory failure with hypercapnia: Secondary | ICD-10-CM | POA: Diagnosis present

## 2020-01-21 DIAGNOSIS — Z20822 Contact with and (suspected) exposure to covid-19: Secondary | ICD-10-CM | POA: Diagnosis present

## 2020-01-21 DIAGNOSIS — J9602 Acute respiratory failure with hypercapnia: Secondary | ICD-10-CM | POA: Diagnosis present

## 2020-01-21 DIAGNOSIS — J9621 Acute and chronic respiratory failure with hypoxia: Secondary | ICD-10-CM | POA: Diagnosis present

## 2020-01-21 DIAGNOSIS — F40240 Claustrophobia: Secondary | ICD-10-CM | POA: Diagnosis present

## 2020-01-21 DIAGNOSIS — S73004A Unspecified dislocation of right hip, initial encounter: Secondary | ICD-10-CM

## 2020-01-21 DIAGNOSIS — I214 Non-ST elevation (NSTEMI) myocardial infarction: Secondary | ICD-10-CM | POA: Diagnosis present

## 2020-01-21 DIAGNOSIS — Z882 Allergy status to sulfonamides status: Secondary | ICD-10-CM

## 2020-01-21 DIAGNOSIS — J439 Emphysema, unspecified: Secondary | ICD-10-CM | POA: Diagnosis present

## 2020-01-21 DIAGNOSIS — W19XXXA Unspecified fall, initial encounter: Secondary | ICD-10-CM | POA: Diagnosis not present

## 2020-01-21 DIAGNOSIS — U071 COVID-19: Secondary | ICD-10-CM | POA: Diagnosis present

## 2020-01-21 DIAGNOSIS — J441 Chronic obstructive pulmonary disease with (acute) exacerbation: Secondary | ICD-10-CM

## 2020-01-21 DIAGNOSIS — I5043 Acute on chronic combined systolic (congestive) and diastolic (congestive) heart failure: Secondary | ICD-10-CM | POA: Diagnosis present

## 2020-01-21 DIAGNOSIS — S72044A Nondisplaced fracture of base of neck of right femur, initial encounter for closed fracture: Secondary | ICD-10-CM | POA: Diagnosis not present

## 2020-01-21 DIAGNOSIS — J189 Pneumonia, unspecified organism: Secondary | ICD-10-CM | POA: Diagnosis present

## 2020-01-21 DIAGNOSIS — Z8616 Personal history of COVID-19: Secondary | ICD-10-CM | POA: Diagnosis not present

## 2020-01-21 DIAGNOSIS — A419 Sepsis, unspecified organism: Secondary | ICD-10-CM | POA: Diagnosis not present

## 2020-01-21 DIAGNOSIS — E871 Hypo-osmolality and hyponatremia: Secondary | ICD-10-CM | POA: Diagnosis present

## 2020-01-21 DIAGNOSIS — L89600 Pressure ulcer of unspecified heel, unstageable: Secondary | ICD-10-CM | POA: Insufficient documentation

## 2020-01-21 DIAGNOSIS — E440 Moderate protein-calorie malnutrition: Secondary | ICD-10-CM | POA: Diagnosis present

## 2020-01-21 DIAGNOSIS — R778 Other specified abnormalities of plasma proteins: Secondary | ICD-10-CM

## 2020-01-21 DIAGNOSIS — J9601 Acute respiratory failure with hypoxia: Secondary | ICD-10-CM | POA: Diagnosis not present

## 2020-01-21 DIAGNOSIS — N179 Acute kidney failure, unspecified: Secondary | ICD-10-CM | POA: Diagnosis not present

## 2020-01-21 DIAGNOSIS — Z7189 Other specified counseling: Secondary | ICD-10-CM | POA: Diagnosis not present

## 2020-01-21 DIAGNOSIS — S72009A Fracture of unspecified part of neck of unspecified femur, initial encounter for closed fracture: Secondary | ICD-10-CM | POA: Diagnosis present

## 2020-01-21 DIAGNOSIS — Z66 Do not resuscitate: Secondary | ICD-10-CM | POA: Diagnosis not present

## 2020-01-21 DIAGNOSIS — I110 Hypertensive heart disease with heart failure: Secondary | ICD-10-CM | POA: Diagnosis present

## 2020-01-21 DIAGNOSIS — D62 Acute posthemorrhagic anemia: Secondary | ICD-10-CM | POA: Diagnosis not present

## 2020-01-21 DIAGNOSIS — I452 Bifascicular block: Secondary | ICD-10-CM | POA: Diagnosis present

## 2020-01-21 DIAGNOSIS — Z886 Allergy status to analgesic agent status: Secondary | ICD-10-CM

## 2020-01-21 DIAGNOSIS — J309 Allergic rhinitis, unspecified: Secondary | ICD-10-CM | POA: Diagnosis present

## 2020-01-21 DIAGNOSIS — Z7983 Long term (current) use of bisphosphonates: Secondary | ICD-10-CM

## 2020-01-21 DIAGNOSIS — S72001A Fracture of unspecified part of neck of right femur, initial encounter for closed fracture: Secondary | ICD-10-CM

## 2020-01-21 DIAGNOSIS — M810 Age-related osteoporosis without current pathological fracture: Secondary | ICD-10-CM | POA: Diagnosis present

## 2020-01-21 DIAGNOSIS — R0602 Shortness of breath: Secondary | ICD-10-CM

## 2020-01-21 DIAGNOSIS — H913 Deaf nonspeaking, not elsewhere classified: Secondary | ICD-10-CM | POA: Diagnosis present

## 2020-01-21 DIAGNOSIS — I248 Other forms of acute ischemic heart disease: Secondary | ICD-10-CM | POA: Diagnosis not present

## 2020-01-21 DIAGNOSIS — R9389 Abnormal findings on diagnostic imaging of other specified body structures: Secondary | ICD-10-CM

## 2020-01-21 DIAGNOSIS — K219 Gastro-esophageal reflux disease without esophagitis: Secondary | ICD-10-CM | POA: Diagnosis present

## 2020-01-21 DIAGNOSIS — Z515 Encounter for palliative care: Secondary | ICD-10-CM

## 2020-01-21 DIAGNOSIS — L89152 Pressure ulcer of sacral region, stage 2: Secondary | ICD-10-CM

## 2020-01-21 DIAGNOSIS — H9190 Unspecified hearing loss, unspecified ear: Secondary | ICD-10-CM | POA: Diagnosis present

## 2020-01-21 DIAGNOSIS — E559 Vitamin D deficiency, unspecified: Secondary | ICD-10-CM | POA: Diagnosis present

## 2020-01-21 DIAGNOSIS — I700 Atherosclerosis of aorta: Secondary | ICD-10-CM | POA: Diagnosis present

## 2020-01-21 DIAGNOSIS — F329 Major depressive disorder, single episode, unspecified: Secondary | ICD-10-CM | POA: Diagnosis present

## 2020-01-21 DIAGNOSIS — T458X5A Adverse effect of other primarily systemic and hematological agents, initial encounter: Secondary | ICD-10-CM | POA: Diagnosis present

## 2020-01-21 DIAGNOSIS — E876 Hypokalemia: Secondary | ICD-10-CM | POA: Diagnosis not present

## 2020-01-21 DIAGNOSIS — Z419 Encounter for procedure for purposes other than remedying health state, unspecified: Secondary | ICD-10-CM

## 2020-01-21 LAB — ECHOCARDIOGRAM COMPLETE
AR max vel: 2.25 cm2
AV Area VTI: 2.46 cm2
AV Area mean vel: 2.51 cm2
AV Mean grad: 3.5 mmHg
AV Peak grad: 6.5 mmHg
Ao pk vel: 1.27 m/s
Area-P 1/2: 3.34 cm2
Height: 66 in
S' Lateral: 3.08 cm
Weight: 1971.79 oz

## 2020-01-21 LAB — CBC WITH DIFFERENTIAL/PLATELET
Abs Immature Granulocytes: 0.05 10*3/uL (ref 0.00–0.07)
Basophils Absolute: 0 10*3/uL (ref 0.0–0.1)
Basophils Relative: 0 %
Eosinophils Absolute: 0.5 10*3/uL (ref 0.0–0.5)
Eosinophils Relative: 4 %
HCT: 29.4 % — ABNORMAL LOW (ref 36.0–46.0)
Hemoglobin: 9.8 g/dL — ABNORMAL LOW (ref 12.0–15.0)
Immature Granulocytes: 1 %
Lymphocytes Relative: 12 %
Lymphs Abs: 1.3 10*3/uL (ref 0.7–4.0)
MCH: 30.6 pg (ref 26.0–34.0)
MCHC: 33.3 g/dL (ref 30.0–36.0)
MCV: 91.9 fL (ref 80.0–100.0)
Monocytes Absolute: 1 10*3/uL (ref 0.1–1.0)
Monocytes Relative: 10 %
Neutro Abs: 8 10*3/uL — ABNORMAL HIGH (ref 1.7–7.7)
Neutrophils Relative %: 73 %
Platelets: 278 10*3/uL (ref 150–400)
RBC: 3.2 MIL/uL — ABNORMAL LOW (ref 3.87–5.11)
RDW: 14 % (ref 11.5–15.5)
WBC: 10.8 10*3/uL — ABNORMAL HIGH (ref 4.0–10.5)
nRBC: 0 % (ref 0.0–0.2)

## 2020-01-21 LAB — COMPREHENSIVE METABOLIC PANEL
ALT: 16 U/L (ref 0–44)
AST: 25 U/L (ref 15–41)
Albumin: 4.2 g/dL (ref 3.5–5.0)
Alkaline Phosphatase: 59 U/L (ref 38–126)
Anion gap: 14 (ref 5–15)
BUN: 31 mg/dL — ABNORMAL HIGH (ref 8–23)
CO2: 23 mmol/L (ref 22–32)
Calcium: 8.9 mg/dL (ref 8.9–10.3)
Chloride: 96 mmol/L — ABNORMAL LOW (ref 98–111)
Creatinine, Ser: 1.06 mg/dL — ABNORMAL HIGH (ref 0.44–1.00)
GFR calc Af Amer: 58 mL/min — ABNORMAL LOW (ref >60)
GFR calc non Af Amer: 50 mL/min — ABNORMAL LOW (ref >60)
Glucose, Bld: 112 mg/dL — ABNORMAL HIGH (ref 70–99)
Potassium: 4.2 mmol/L (ref 3.5–5.1)
Sodium: 133 mmol/L — ABNORMAL LOW (ref 135–145)
Total Bilirubin: 1.2 mg/dL (ref 0.3–1.2)
Total Protein: 7.2 g/dL (ref 6.5–8.1)

## 2020-01-21 LAB — HEPARIN LEVEL (UNFRACTIONATED)
Heparin Unfractionated: 0.12 IU/mL — ABNORMAL LOW (ref 0.30–0.70)
Heparin Unfractionated: 0.19 IU/mL — ABNORMAL LOW (ref 0.30–0.70)

## 2020-01-21 LAB — PROTIME-INR
INR: 1.1 (ref 0.8–1.2)
Prothrombin Time: 13.5 seconds (ref 11.4–15.2)

## 2020-01-21 LAB — TROPONIN I (HIGH SENSITIVITY)
Troponin I (High Sensitivity): 245 ng/L (ref <18)
Troponin I (High Sensitivity): 1281 ng/L (ref <18)

## 2020-01-21 LAB — SARS CORONAVIRUS 2 BY RT PCR (HOSPITAL ORDER, PERFORMED IN ~~LOC~~ HOSPITAL LAB): SARS Coronavirus 2: NEGATIVE

## 2020-01-21 LAB — BRAIN NATRIURETIC PEPTIDE: B Natriuretic Peptide: 191.6 pg/mL — ABNORMAL HIGH (ref 0.0–100.0)

## 2020-01-21 LAB — APTT: aPTT: 29 seconds (ref 24–36)

## 2020-01-21 SURGERY — FIXATION, FRACTURE, INTERTROCHANTERIC, WITH INTRAMEDULLARY ROD
Anesthesia: Choice | Laterality: Right

## 2020-01-21 MED ORDER — METHYLPREDNISOLONE SODIUM SUCC 40 MG IJ SOLR
40.0000 mg | Freq: Three times a day (TID) | INTRAMUSCULAR | Status: DC
  Administered 2020-01-21 – 2020-01-23 (×6): 40 mg via INTRAVENOUS
  Filled 2020-01-21 (×7): qty 1

## 2020-01-21 MED ORDER — IPRATROPIUM-ALBUTEROL 0.5-2.5 (3) MG/3ML IN SOLN
3.0000 mL | Freq: Once | RESPIRATORY_TRACT | Status: AC
  Administered 2020-01-21: 3 mL via RESPIRATORY_TRACT
  Filled 2020-01-21: qty 3

## 2020-01-21 MED ORDER — SODIUM CHLORIDE 0.9 % IV SOLN: INTRAVENOUS | Status: DC

## 2020-01-21 MED ORDER — CALCIUM CARBONATE-VITAMIN D 500-200 MG-UNIT PO TABS
1.0000 | ORAL_TABLET | Freq: Two times a day (BID) | ORAL | Status: DC
  Administered 2020-01-21 – 2020-02-03 (×27): 1 via ORAL
  Filled 2020-01-21 (×28): qty 1

## 2020-01-21 MED ORDER — FERROUS SULFATE 325 (65 FE) MG PO TABS
325.0000 mg | ORAL_TABLET | Freq: Two times a day (BID) | ORAL | Status: DC
  Administered 2020-01-21 – 2020-02-03 (×26): 325 mg via ORAL
  Filled 2020-01-21 (×27): qty 1

## 2020-01-21 MED ORDER — ASPIRIN 81 MG PO CHEW
324.0000 mg | CHEWABLE_TABLET | Freq: Once | ORAL | Status: AC
  Administered 2020-01-21: 324 mg via ORAL
  Filled 2020-01-21: qty 4

## 2020-01-21 MED ORDER — VITAMIN D 25 MCG (1000 UNIT) PO TABS
5000.0000 [IU] | ORAL_TABLET | Freq: Every day | ORAL | Status: DC
  Administered 2020-01-21 – 2020-02-03 (×14): 5000 [IU] via ORAL
  Filled 2020-01-21 (×14): qty 5

## 2020-01-21 MED ORDER — IPRATROPIUM-ALBUTEROL 0.5-2.5 (3) MG/3ML IN SOLN
3.0000 mL | Freq: Four times a day (QID) | RESPIRATORY_TRACT | Status: DC
  Administered 2020-01-21 – 2020-01-26 (×14): 3 mL via RESPIRATORY_TRACT
  Filled 2020-01-21 (×12): qty 3

## 2020-01-21 MED ORDER — ALBUTEROL SULFATE (2.5 MG/3ML) 0.083% IN NEBU
3.0000 mL | INHALATION_SOLUTION | RESPIRATORY_TRACT | Status: DC | PRN
  Filled 2020-01-21: qty 3

## 2020-01-21 MED ORDER — HEPARIN (PORCINE) 25000 UT/250ML-% IV SOLN
1000.0000 [IU]/h | INTRAVENOUS | Status: DC
  Administered 2020-01-21: 650 [IU]/h via INTRAVENOUS
  Administered 2020-01-22: 1000 [IU]/h via INTRAVENOUS
  Filled 2020-01-21 (×2): qty 250

## 2020-01-21 MED ORDER — ONDANSETRON HCL 4 MG/2ML IJ SOLN
4.0000 mg | Freq: Four times a day (QID) | INTRAMUSCULAR | Status: DC | PRN
  Administered 2020-01-25 – 2020-01-26 (×2): 4 mg via INTRAVENOUS
  Filled 2020-01-21 (×2): qty 2

## 2020-01-21 MED ORDER — HEPARIN BOLUS VIA INFUSION
1650.0000 [IU] | Freq: Once | INTRAVENOUS | Status: AC
  Administered 2020-01-21: 1650 [IU] via INTRAVENOUS
  Filled 2020-01-21: qty 1650

## 2020-01-21 MED ORDER — MIDAZOLAM HCL 2 MG/2ML IJ SOLN: 1.0000 mg | Freq: Once | INTRAMUSCULAR | Status: AC

## 2020-01-21 MED ORDER — MIDAZOLAM HCL 2 MG/2ML IJ SOLN
INTRAMUSCULAR | Status: AC
  Administered 2020-01-21: 1 mg via INTRAVENOUS
  Filled 2020-01-21: qty 2

## 2020-01-21 MED ORDER — MORPHINE SULFATE (PF) 2 MG/ML IV SOLN
2.0000 mg | INTRAVENOUS | Status: DC | PRN
  Filled 2020-01-21: qty 1

## 2020-01-21 MED ORDER — ACETAMINOPHEN 325 MG PO TABS
650.0000 mg | ORAL_TABLET | Freq: Four times a day (QID) | ORAL | Status: DC | PRN
  Administered 2020-01-22 – 2020-01-27 (×7): 650 mg via ORAL
  Filled 2020-01-21 (×7): qty 2

## 2020-01-21 MED ORDER — ONDANSETRON HCL 4 MG PO TABS: 4.0000 mg | ORAL_TABLET | ORAL | Status: DC | PRN

## 2020-01-21 MED ORDER — SODIUM CHLORIDE 0.9 % IV SOLN
500.0000 mg | INTRAVENOUS | Status: DC
  Administered 2020-01-21 – 2020-01-23 (×3): 500 mg via INTRAVENOUS
  Filled 2020-01-21 (×4): qty 500

## 2020-01-21 MED ORDER — ONDANSETRON HCL 4 MG/2ML IJ SOLN
4.0000 mg | INTRAMUSCULAR | Status: AC
  Administered 2020-01-21: 4 mg via INTRAVENOUS
  Filled 2020-01-21: qty 2

## 2020-01-21 MED ORDER — ACETAMINOPHEN 650 MG RE SUPP: 650.0000 mg | Freq: Four times a day (QID) | RECTAL | Status: DC | PRN

## 2020-01-21 MED ORDER — ACETAMINOPHEN 500 MG PO TABS: 500.0000 mg | ORAL_TABLET | Freq: Three times a day (TID) | ORAL | Status: DC | PRN

## 2020-01-21 MED ORDER — ATORVASTATIN CALCIUM 80 MG PO TABS
80.0000 mg | ORAL_TABLET | Freq: Every day | ORAL | Status: DC
  Administered 2020-01-21 – 2020-02-03 (×14): 80 mg via ORAL
  Filled 2020-01-21 (×3): qty 1
  Filled 2020-01-21: qty 4
  Filled 2020-01-21 (×6): qty 1
  Filled 2020-01-21: qty 4
  Filled 2020-01-21 (×3): qty 1

## 2020-01-21 MED ORDER — IOHEXOL 350 MG/ML SOLN
75.0000 mL | Freq: Once | INTRAVENOUS | Status: AC | PRN
  Administered 2020-01-21: 75 mL via INTRAVENOUS

## 2020-01-21 MED ORDER — NITROGLYCERIN 0.4 MG SL SUBL
0.4000 mg | SUBLINGUAL_TABLET | SUBLINGUAL | Status: DC | PRN
  Administered 2020-01-24: 0.4 mg via SUBLINGUAL
  Filled 2020-01-21: qty 1

## 2020-01-21 MED ORDER — MORPHINE SULFATE (PF) 2 MG/ML IV SOLN
2.0000 mg | INTRAVENOUS | Status: DC | PRN
  Administered 2020-01-22 – 2020-01-25 (×6): 2 mg via INTRAVENOUS
  Filled 2020-01-21 (×5): qty 1

## 2020-01-21 MED ORDER — PANTOPRAZOLE SODIUM 40 MG PO TBEC
40.0000 mg | DELAYED_RELEASE_TABLET | Freq: Every day | ORAL | Status: DC
  Administered 2020-01-21 – 2020-02-03 (×14): 40 mg via ORAL
  Filled 2020-01-21 (×15): qty 1

## 2020-01-21 MED ORDER — OYSTER SHELL CALCIUM/D 500-5 MG-MCG PO TABS
ORAL_TABLET | Freq: Two times a day (BID) | ORAL | Status: DC
  Filled 2020-01-21: qty 1

## 2020-01-21 MED ORDER — ENSURE ENLIVE PO LIQD
237.0000 mL | Freq: Three times a day (TID) | ORAL | Status: DC
  Administered 2020-01-22 – 2020-01-31 (×10): 237 mL via ORAL

## 2020-01-21 MED ORDER — LOPERAMIDE HCL 2 MG PO CAPS
2.0000 mg | ORAL_CAPSULE | Freq: Every day | ORAL | Status: DC | PRN
  Filled 2020-01-21: qty 1

## 2020-01-21 MED ORDER — MAGNESIUM HYDROXIDE 400 MG/5ML PO SUSP: 30.0000 mL | Freq: Every day | ORAL | Status: DC | PRN

## 2020-01-21 MED ORDER — METOLAZONE 2.5 MG PO TABS
2.5000 mg | ORAL_TABLET | Freq: Two times a day (BID) | ORAL | Status: DC
  Administered 2020-01-21 – 2020-01-27 (×12): 2.5 mg via ORAL
  Filled 2020-01-21 (×19): qty 1

## 2020-01-21 MED ORDER — MORPHINE SULFATE (PF) 4 MG/ML IV SOLN
4.0000 mg | Freq: Once | INTRAVENOUS | Status: AC
  Administered 2020-01-21: 4 mg via INTRAVENOUS
  Filled 2020-01-21: qty 1

## 2020-01-21 MED ORDER — HEPARIN BOLUS VIA INFUSION
2000.0000 [IU] | Freq: Once | INTRAVENOUS | Status: AC
  Administered 2020-01-21: 2000 [IU] via INTRAVENOUS
  Filled 2020-01-21: qty 2000

## 2020-01-21 MED ORDER — CEFAZOLIN SODIUM-DEXTROSE 2-4 GM/100ML-% IV SOLN
2.0000 g | Freq: Once | INTRAVENOUS | Status: DC
  Filled 2020-01-21: qty 100

## 2020-01-21 MED ORDER — POTASSIUM CHLORIDE CRYS ER 20 MEQ PO TBCR
40.0000 meq | EXTENDED_RELEASE_TABLET | Freq: Every day | ORAL | Status: DC
  Administered 2020-01-21 – 2020-02-03 (×14): 40 meq via ORAL
  Filled 2020-01-21 (×15): qty 2

## 2020-01-21 MED ORDER — DILTIAZEM HCL ER COATED BEADS 120 MG PO CP24
240.0000 mg | ORAL_CAPSULE | Freq: Every day | ORAL | Status: DC
  Administered 2020-01-22 – 2020-01-26 (×5): 240 mg via ORAL
  Filled 2020-01-21 (×3): qty 2
  Filled 2020-01-21 (×2): qty 1
  Filled 2020-01-21: qty 2
  Filled 2020-01-21: qty 1
  Filled 2020-01-21: qty 2

## 2020-01-21 MED ORDER — ALBUTEROL SULFATE (2.5 MG/3ML) 0.083% IN NEBU: 2.5000 mg | INHALATION_SOLUTION | Freq: Four times a day (QID) | RESPIRATORY_TRACT | Status: DC | PRN

## 2020-01-21 MED ORDER — TIZANIDINE HCL 4 MG PO TABS
4.0000 mg | ORAL_TABLET | Freq: Every day | ORAL | Status: DC
  Administered 2020-01-21 – 2020-01-23 (×3): 4 mg via ORAL
  Filled 2020-01-21 (×5): qty 1

## 2020-01-21 MED ORDER — TRAZODONE HCL 50 MG PO TABS: 25.0000 mg | ORAL_TABLET | Freq: Every evening | ORAL | Status: DC | PRN

## 2020-01-21 MED ORDER — ALENDRONATE SODIUM 70 MG PO TABS: 70.0000 mg | ORAL_TABLET | ORAL | Status: DC

## 2020-01-21 MED ORDER — SODIUM CHLORIDE 0.9 % IV SOLN
1.0000 g | INTRAVENOUS | Status: DC
  Administered 2020-01-21 – 2020-01-23 (×3): 1 g via INTRAVENOUS
  Filled 2020-01-21 (×2): qty 1
  Filled 2020-01-21: qty 10

## 2020-01-21 MED ORDER — HEPARIN BOLUS VIA INFUSION
3000.0000 [IU] | Freq: Once | INTRAVENOUS | Status: AC
  Administered 2020-01-21: 3000 [IU] via INTRAVENOUS
  Filled 2020-01-21: qty 3000

## 2020-01-21 MED ORDER — GUAIFENESIN ER 600 MG PO TB12
600.0000 mg | ORAL_TABLET | Freq: Every day | ORAL | Status: DC
  Administered 2020-01-21 – 2020-02-03 (×13): 600 mg via ORAL
  Filled 2020-01-21 (×13): qty 1

## 2020-01-21 MED ORDER — DIPHENHYDRAMINE HCL 25 MG PO CAPS: 25.0000 mg | ORAL_CAPSULE | Freq: Four times a day (QID) | ORAL | Status: DC | PRN

## 2020-01-21 MED ORDER — LORATADINE 10 MG PO TABS
10.0000 mg | ORAL_TABLET | Freq: Every day | ORAL | Status: DC
  Administered 2020-01-21 – 2020-02-03 (×14): 10 mg via ORAL
  Filled 2020-01-21 (×15): qty 1

## 2020-01-21 MED ORDER — ESCITALOPRAM OXALATE 10 MG PO TABS
10.0000 mg | ORAL_TABLET | Freq: Every day | ORAL | Status: DC
  Administered 2020-01-21 – 2020-02-03 (×14): 10 mg via ORAL
  Filled 2020-01-21 (×15): qty 1

## 2020-01-21 MED ORDER — NITROGLYCERIN 0.4 MG SL SUBL: 0.4000 mg | SUBLINGUAL_TABLET | SUBLINGUAL | Status: DC | PRN

## 2020-01-21 MED ORDER — ONDANSETRON HCL 4 MG PO TABS: 4.0000 mg | ORAL_TABLET | Freq: Four times a day (QID) | ORAL | Status: DC | PRN

## 2020-01-21 MED ORDER — ALUM & MAG HYDROXIDE-SIMETH 200-200-20 MG/5ML PO SUSP
30.0000 mL | Freq: Three times a day (TID) | ORAL | Status: DC
  Administered 2020-01-21 – 2020-02-03 (×49): 30 mL via ORAL
  Filled 2020-01-21 (×49): qty 30

## 2020-01-21 SURGICAL SUPPLY — 32 items
BNDG COHESIVE 4X5 TAN STRL (GAUZE/BANDAGES/DRESSINGS) IMPLANT
BRUSH SCRUB EZ  4% CHG (MISCELLANEOUS) ×4
BRUSH SCRUB EZ 4% CHG (MISCELLANEOUS) ×2 IMPLANT
CANISTER SUCT 1200ML W/VALVE (MISCELLANEOUS) ×3 IMPLANT
CHLORAPREP W/TINT 26 (MISCELLANEOUS) ×3 IMPLANT
COVER WAND RF STERILE (DRAPES) ×3 IMPLANT
DRAPE 3/4 80X56 (DRAPES) ×3 IMPLANT
DRAPE U-SHAPE 47X51 STRL (DRAPES) ×3 IMPLANT
DRSG AQUACEL AG ADV 3.5X 4 (GAUZE/BANDAGES/DRESSINGS) IMPLANT
DRSG AQUACEL AG ADV 3.5X10 (GAUZE/BANDAGES/DRESSINGS) ×3 IMPLANT
ELECT REM PT RETURN 9FT ADLT (ELECTROSURGICAL) ×3
ELECTRODE REM PT RTRN 9FT ADLT (ELECTROSURGICAL) ×1 IMPLANT
GAUZE XEROFORM 1X8 LF (GAUZE/BANDAGES/DRESSINGS) ×3 IMPLANT
GLOVE INDICATOR 8.0 STRL GRN (GLOVE) ×3 IMPLANT
GLOVE SURG ORTHO 8.0 STRL STRW (GLOVE) ×3 IMPLANT
GOWN STRL REUS W/ TWL LRG LVL3 (GOWN DISPOSABLE) ×1 IMPLANT
GOWN STRL REUS W/ TWL XL LVL3 (GOWN DISPOSABLE) ×1 IMPLANT
GOWN STRL REUS W/TWL LRG LVL3 (GOWN DISPOSABLE) ×2
GOWN STRL REUS W/TWL XL LVL3 (GOWN DISPOSABLE) ×2
KIT PATIENT CARE HANA TABLE (KITS) ×3 IMPLANT
KIT TURNOVER CYSTO (KITS) ×3 IMPLANT
MAT ABSORB  FLUID 56X50 GRAY (MISCELLANEOUS) ×2
MAT ABSORB FLUID 56X50 GRAY (MISCELLANEOUS) ×1 IMPLANT
NEEDLE SPNL 20GX3.5 QUINCKE YW (NEEDLE) ×3 IMPLANT
NS IRRIG 1000ML POUR BTL (IV SOLUTION) ×3 IMPLANT
PACK HIP COMPR (MISCELLANEOUS) ×3 IMPLANT
STAPLER SKIN PROX 35W (STAPLE) ×3 IMPLANT
SUT VIC AB 0 CT1 36 (SUTURE) ×3 IMPLANT
SUT VIC AB 2-0 CT1 27 (SUTURE) ×2
SUT VIC AB 2-0 CT1 TAPERPNT 27 (SUTURE) ×1 IMPLANT
SYR 30ML LL (SYRINGE) ×3 IMPLANT
TOWEL OR 17X26 4PK STRL BLUE (TOWEL DISPOSABLE) ×3 IMPLANT

## 2020-01-21 NOTE — H&P (Addendum)
[place]   PATIENT NAME: Alice Wallace    MR#:  [id number]  DATE OF BIRTH:  24-Jan-1941  DATE OF ADMISSION:  01/21/2020  PRIMARY CARE PHYSICIAN: Devoria Glassing, NP   REQUESTING/REFERRING PHYSICIAN: Loleta Rose, MD CHIEF COMPLAINT:   Chief Complaint  Patient presents with  . Fall  . Shortness of Breath    HISTORY OF PRESENT ILLNESS:  Alice Wallace  is a 79 y.o. Caucasian female with a known history of COPD, hypertension, deafness and muteness, presented to the emergency room with acute onset of fall at her assisted living facility with subsequent right hip pain and significant respiratory distress. Her pulse currently was down to the 70s on her 2 L O2 by nasal cannula with associated coarse breath sounds and wheezing. She was increased to 6 L/min for a pulse ox to reach the 90s. She had not have any reported chest pain or palpitations, fever or chills or nausea or vomiting. She was thought to be in COPD exacerbation was given IV Solu-Medrol and nebulized albuterol as well as duo nebs by EMS on route to the hospital. She can read lips and written communications.  Upon presentation to the emergency room, heart rate was 106 and respiratory rate 23 with pulse currently 76% on room airand 93-96% on 6 L of O2 by nasal cannula. Labs revealed mild hyponatremia and hypochloremia and a BUN of 31, BNP of 191.6, high-sensitivity troponin I of 245 mL 1281 and CBC showed leukocytosis of 10.8 as well as anemia. COVID-19 PCR came back negative. EKG showed mild ectopic atrial tachycardia with rate 0.1 with incomplete right bundle branch block left anterior fascicular block and LVH with T wave inversion laterally.   The patient was given aspirin as well as IV heparin, DuoNeb's, IV Versed and morphine sulfate as well as 4 mg of IV Zofran. The patient had a chest CTA that ruled out PE and showed right lower lobe atelectasis and a head CT scan since suspicious for nondisplaced stress fracture of the  proximal right femoral diaphysis with consideration for MRI. [name] was notified about the patient. She will be admitted to a medically monitored bed for further evaluation and management. PAST MEDICAL HISTORY:   Past Medical History:  Diagnosis Date  . COPD (chronic obstructive pulmonary disease) (HCC)   . Deaf   . Hypertension   . Osteoporosis     PAST SURGICAL HISTORY:  History reviewed. No pertinent surgical history. No reported known surgeries.  SOCIAL HISTORY:   Social History   Tobacco Use  . Smoking status: Never Smoker  . Smokeless tobacco: Never Used  Substance Use Topics  . Alcohol use: Not Currently    FAMILY HISTORY:  No family history on file. No pertinent familial diseases.  DRUG ALLERGIES:   Allergies  Allergen Reactions  . Hydrocodone     Other reaction(s): Unknown  . Naproxen     Other reaction(s): Unknown  . Other Other (See Comments)    nuts  . Sulfamethoxazole-Trimethoprim     Other reaction(s): Unknown  . Tramadol     Other reaction(s): Unknown Pt has tolerated this med 1/5-1/7 with no issues    REVIEW OF SYSTEMS:   ROS As per history of present illness. All pertinent systems were reviewed above. Constitutional, HEENT, cardiovascular, respiratory, GI, GU, musculoskeletal, neuro, psychiatric, endocrine, integumentary and hematologic systems were reviewed and are otherwise negative/unremarkable except for positive findings mentioned above in the HPI.   MEDICATIONS AT HOME:  Prior to Admission medications   Medication Sig Start Date End Date Taking? Authorizing Provider  acetaminophen (TYLENOL) 325 MG tablet Take 650 mg by mouth in the morning, at noon, and at bedtime.   Yes [provider]  acetaminophen (TYLENOL) 500 MG tablet Take 500 mg by mouth every 8 (eight) hours as needed for moderate pain or fever.   Yes [provider]  albuterol (PROVENTIL) (2.5 MG/3ML) 0.083% nebulizer solution Take 2.5 mg by nebulization  every 6 (six) hours as needed for wheezing or shortness of breath.   Yes [provider]  albuterol (VENTOLIN HFA) 108 (90 Base) MCG/ACT inhaler Inhale 2 puffs into the lungs every 4 (four) hours as needed for wheezing or shortness of breath.   Yes [provider]  alendronate (FOSAMAX) 70 MG tablet Take 70 mg by mouth once a week. Take with a full glass of water on an empty stomach.   Yes [provider]  aluminum-magnesium hydroxide-simethicone (MAALOX) 200-200-20 MG/5ML SUSP Take 30 mLs by mouth 4 (four) times daily -  before meals and at bedtime.   Yes [provider]  aspirin EC 81 MG tablet Take 81 mg by mouth daily. Swallow whole.   Yes [provider]  Calcium Carbonate-Vitamin D3 (CALCIUM 600-D) 600-400 MG-UNIT TABS Take 1 tablet by mouth 2 (two) times daily.   Yes [provider]  cetirizine (ZYRTEC) 10 MG tablet Take 10 mg by mouth daily.   Yes [provider]  Cholecalciferol (VITAMIN D) 125 MCG (5000 UT) CAPS Take 5,000 Units by mouth daily.   Yes [provider]  diclofenac sodium (VOLTAREN) 1 % GEL Apply 2 g topically 3 (three) times daily. 01/03/19  Yes [provider]  diltiazem (CARDIZEM CD) 240 MG 24 hr capsule Take 240 mg by mouth daily. 01/11/19  Yes [provider]  diphenhydrAMINE (BENADRYL) 25 MG tablet Take 25 mg by mouth every 6 (six) hours as needed for allergies.   Yes [provider]  escitalopram (LEXAPRO) 10 MG tablet Take 10 mg by mouth at bedtime. 06/08/16  Yes [provider]  feeding supplement, ENSURE ENLIVE, (ENSURE ENLIVE) LIQD Take 237 mLs by mouth 3 (three) times daily between meals. 06/10/19  Yes Enedina Finner, MD  ferrous sulfate 325 (65 FE) MG tablet Take 325 mg by mouth 2 (two) times daily with a meal.    Yes [provider]  Fluticasone-Salmeterol (ADVAIR) 100-50 MCG/DOSE AEPB Inhale 1 puff into the lungs 2 (two) times daily.   Yes [provider]  furosemide (LASIX) 20 MG tablet Take 20 mg by mouth daily.   Yes [provider]  guaiFENesin (MUCINEX) 600 MG 12 hr tablet Take 600 mg by mouth daily.   Yes [provider]  guaifenesin (ROBITUSSIN) 100 MG/5ML syrup Take 300 mg by mouth 4 (four) times daily as needed for cough.   Yes [provider]  loperamide (IMODIUM A-D) 2 MG tablet Take 2 mg by mouth daily as needed for diarrhea or loose stools.   Yes [provider]  magnesium hydroxide (MILK OF MAGNESIA) 400 MG/5ML suspension Take 30 mLs by mouth daily as needed for mild constipation.   Yes [provider]  metolazone (ZAROXOLYN) 2.5 MG tablet Take 2.5 mg by mouth 2 (two) times daily.   Yes [provider]  omeprazole (PRILOSEC) 20 MG capsule Take 20 mg by mouth daily.   Yes [provider]  ondansetron (ZOFRAN) 4 MG tablet Take 4 mg  by mouth every 4 (four) hours as needed for nausea or vomiting.   Yes [provider]  potassium chloride SA (KLOR-CON) 20 MEQ tablet Take 40 mEq by mouth daily.   Yes [provider]  tiZANidine (ZANAFLEX) 4 MG tablet Take 4 mg by mouth daily.   Yes [provider]  tolnaftate (ANTIFUNGAL) 1 % cream Apply 1 application topically as needed.   Yes [provider]  traMADol (ULTRAM) 50 MG tablet Take 50 mg by mouth every 8 (eight) hours.   Yes [provider]  azithromycin (ZITHROMAX) 250 MG tablet Daily for 9 days Patient not taking: Reported on 01/21/2020 06/11/19   Enedina FinnerPatel, Sona, MD  Multiple Vitamin (MULTIVITAMIN WITH MINERALS) TABS tablet Take 1 tablet by mouth daily. Patient not taking: Reported on 01/21/2020 06/11/19   Enedina FinnerPatel, Sona, MD  predniSONE (DELTASONE) 10 MG tablet Take 3 tablets (30 mg total) by mouth daily with breakfast. Take 30 mg day 1 then  -- 30mg  day 2 and so on  --20 mg  --20mg   --20mg   -10 mg  --10mg   --10mg  then stop Patient not taking: Reported on 01/21/2020 06/11/19    Enedina FinnerPatel, Sona, MD      VITAL SIGNS:  Blood pressure 116/64, pulse 93, temperature 97.7 F (36.5 C), temperature source Axillary, resp. rate 20, height 5\' 6"  (1.676 m), weight 55.9 kg, SpO2 96 %.  PHYSICAL EXAMINATION:  Physical Exam  GENERAL:  79 y.o.-year-old Caucasian female patient lying in the bed with no acute distress.  EYES: Pupils equal, round, reactive to light and accommodation. No scleral icterus. Extraocular muscles intact.  HEENT: Head atraumatic, normocephalic. Oropharynx and nasopharynx clear.  NECK:  Supple, no jugular venous distention. No thyroid enlargement, no tenderness.  LUNGS: Diffuse expiratory wheezes with tight expiratory airflow and harsh vesicular breathing. CARDIOVASCULAR: Regular rate and rhythm, S1, S2 normal. No murmurs, rubs, or gallops.  ABDOMEN: Soft, nondistended, nontender. Bowel sounds present. No organomegaly or mass.  EXTREMITIES: No pedal edema, cyanosis, or clubbing. Musculoskeletal: She has mild bilateral hip tenderness. NEUROLOGIC: Cranial nerves II through XII are intact. Muscle strength 5/5 in all extremities. Sensation intact. Gait not checked.  PSYCHIATRIC: The patient is alert and oriented x 3.  Normal affect and good eye contact. SKIN: No obvious rash, lesion, or ulcer.   LABORATORY PANEL:   CBC Recent Labs  Lab 01/21/20 0253  WBC 10.8*  HGB 9.8*  HCT 29.4*  PLT 278   ------------------------------------------------------------------------------------------------------------------  Chemistries  Recent Labs  Lab 01/21/20 0253  NA 133*  K 4.2  CL 96*  CO2 23  GLUCOSE 112*  BUN 31*  CREATININE 1.06*  CALCIUM 8.9  AST 25  ALT 16  ALKPHOS 59  BILITOT 1.2   ------------------------------------------------------------------------------------------------------------------  Cardiac Enzymes No results for input(s): TROPONINI in the last 168  hours. ------------------------------------------------------------------------------------------------------------------  RADIOLOGY:  CT Angio Chest PE W/Cm &/Or Wo Cm  Result Date: 01/21/2020 CLINICAL DATA:  Fall and shortness of breath. EXAM: CT ANGIOGRAPHY CHEST WITH CONTRAST TECHNIQUE: Multidetector CT imaging of the chest was performed using the standard protocol during bolus administration of intravenous contrast. Multiplanar CT image reconstructions and MIPs were obtained to evaluate the vascular anatomy. CONTRAST:  75mL OMNIPAQUE IOHEXOL 350 MG/ML SOLN COMPARISON:  06/09/2019 FINDINGS: Cardiovascular: Prominent right ventricular size. No pericardial effusion. Aortic and great vessel atherosclerotic calcification. No pulmonary artery filling defect is seen. There is significant motion artifact at the lung bases. Mediastinum/Nodes: Small to moderate sliding hiatal hernia. No adenopathy. Lungs/Pleura: Opacity in  the right lower lobe with volume loss. No edema, effusion, or pneumothorax. Panlobular emphysema. Dystrophic calcifications in the apical lungs. Bronchomalacia. Upper Abdomen: Atherosclerotic calcification. Musculoskeletal: Healing anterior and lateral left rib fractures. Review of the MIP images confirms the above findings. IMPRESSION: 1. Right lower lobe atelectasis versus pneumonia. 2. Moderately motion degraded chest CTA. No evidence of pulmonary embolism. 3. Advanced emphysema. Aortic Atherosclerosis (ICD10-I70.0) and Emphysema (ICD10-J43.9). Electronically Signed   By: Marnee Spring M.D.   On: 01/21/2020 05:55   CT Hip Right Wo Contrast  Result Date: 01/21/2020 CLINICAL DATA:  Hip trauma.  Fracture suspected. EXAM: CT OF THE RIGHT HIP WITHOUT CONTRAST TECHNIQUE: Multidetector CT imaging of the right hip was performed according to the standard protocol. Multiplanar CT image reconstructions were also generated. COMPARISON:  Hip x-rays from earlier same day. FINDINGS: Bones/Joint/Cartilage  Linear lucency identified in the lateral cortex of the proximal right femoral diaphysis associated with focal cortical thickening. Imaging features compatible with nondisplaced stress fracture. The abnormality is included on the extreme inferior aspect of this study and is best visualized on sagittal reconstruction image 21 of series 5. Degenerative changes are noted in the right hip. Ligaments Suboptimally assessed by CT. Muscles and Tendons No substantial hemorrhage noted in the proximal thigh musculature. Soft tissues Distended urinary bladder is incompletely visualized. IMPRESSION: Linear lucency in the lateral cortex of the proximal right femoral diaphysis associated with focal cortical thickening. Imaging features compatible with nondisplaced stress fracture. Consider MRI of the proximal right femur for definitive characterization. Electronically Signed   By: Kennith Center M.D.   On: 01/21/2020 06:02   DG Chest Portable 1 View  Result Date: 01/21/2020 CLINICAL DATA:  Larey Seat, short of breath, hypoxia EXAM: PORTABLE CHEST 1 VIEW COMPARISON:  06/07/2019 FINDINGS: Single frontal view of the chest demonstrates a stable cardiac silhouette. Extensive bullous emphysematous changes are seen, greatest at the right apex. Marked areas of scarring and fibrosis are seen throughout the lungs. Increased density at the right costophrenic angle may reflect consolidation and/or effusion. Chronic posttraumatic changes of the thoracic cage. No acute displaced fractures. IMPRESSION: 1. 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 2. Density at the right lateral lung base may reflect focal lung consolidation and/or small effusion. Electronically Signed   By: Sharlet Salina M.D.   On: 01/21/2020 03:48   DG Hip Unilat W or Wo Pelvis 2-3 Views Right  Result Date: 01/21/2020 CLINICAL DATA:  Larey Seat, pain EXAM: DG HIP (WITH OR WITHOUT PELVIS) 2-3V RIGHT COMPARISON:  01/31/2017 FINDINGS: Frontal view of the  pelvis as well as frontal and cross-table lateral views of the right hip are obtained. There is a nondisplaced transverse fracture through the proximal femoral diaphysis. Alignment is anatomic. There are no other acute displaced fractures. The hips are well aligned. Mild bilateral hip osteoarthritis. Sacroiliac joints are normal. IMPRESSION: 1. Nondisplaced transverse fracture through the proximal right femoral diaphysis. Fracture line involves an area of chronic focal cortical thickening, and likely reflects an acute fracture at a site of chronic stress. Electronically Signed   By: Sharlet Salina M.D.   On: 01/21/2020 03:46      IMPRESSION AND PLAN:   1. COPD exacerbation with subsequent acute on chronic respiratory failure with hypoxia. -The patient will be admitted to a medically monitored bed. -She will be placed on scheduled and as needed duo nebs as well as continued steroid therapy with IV Solu-Medrol. -We will place her on IV Rocephin and Zithromax. -O2 protocol will  be followed. -We will hold off Advair Diskus.  2. Fall likely mechanical with possible subsequent right proximal femoral shaft fracture. -Pain management will be provided. -Orthopedic consultation will be obtained. -[name] was notified earlier about patient I will notify Dr. Joice Lofts.  3. Suspected non-STEMI. -The patient will be continued on IV heparin. -She was given aspirin and will be placed on high-dose statin and as needed sublingual nitroglycerin and morphine sulfate. -2D echo and a cardiology consult will be obtained. -Notified Dr. Azucena Cecil with [facility] about the patient.  4. Hypertension. -We will continue Cardizem CD.  5. DVT prophylaxis. -The patient will be on IV heparin.     All the records are reviewed and case discussed with ED provider. The plan of care was discussed in details with the patient (and family). I answered all questions. The patient agreed to proceed with the above mentioned  plan. Further management will depend upon hospital course.   CODE STATUS: Full code  Status is: Inpatient  Remains inpatient appropriate because:Ongoing active pain requiring inpatient pain management, Ongoing diagnostic testing needed not appropriate for outpatient work up, Unsafe d/c plan, IV treatments appropriate due to intensity of illness or inability to take PO and Inpatient level of care appropriate due to severity of illness   Dispo: The patient is from: ALF              Anticipated d/c is to: SNF              Anticipated d/c date is: 3 days              Patient currently is not medically stable to d/c.   TOTAL TIME TAKING CARE OF THIS PATIENT: 55 minutes.    Hannah Beat M.D on 01/21/2020 at 6:15 AM  [facility]   From 7 PM-7 AM, contact night-coverage [email or web address]  CC: Primary care physician; Devoria Glassing, NP   Note: This dictation was prepared with Dragon dictation along with smaller phrase technology. Any transcriptional typo errors that result from this process are unintentional.

## 2020-01-21 NOTE — ED Provider Notes (Signed)
[facility] Emergency Department Provider Note  ____________________________________________   First MD Initiated Contact with Patient 01/21/20 0255     (approximate)  I have reviewed the triage vital signs and the nursing notes.   HISTORY  Chief Complaint Fall and Shortness of Breath  Level 5 caveat:  history/ROS limited by acute/critical illness as well as her chronic hearing impairment and she does not communicate via ASL.  HPI Alice Wallace is a 79 y.o. female with medical history as listed below who is brought to the emergency department by EMS for a fall as well as acute shortness of breath.  She lives in an assisted living facility.  EMS was called because she fell and was having pain in her right hip but when they arrived they found her in severe respiratory distress.  She reportedly uses oxygen at baseline (apparently 2 L) but when they found her she was on 2 L of oxygen but had oxygen saturation in the 70s with coarse breath sounds and wheezing throughout.  They provided Solu-Medrol 125 mg, albuterol 5 mg, and a DuoNeb treatment prior to arrival.  She was feeling better upon arrival but was still hypoxemic on nasal cannula so she arrived on a nonrebreather for the breathing treatments.  She is not able to provide any additional history but is still in moderate respiratory distress upon arrival.         Past Medical History:  Diagnosis Date  . COPD (chronic obstructive pulmonary disease) (HCC)   . Deaf   . Hypertension   . Osteoporosis     Patient Active Problem List   Diagnosis Date Noted  . Acute respiratory failure with hypoxia (HCC)   . Pneumonia due to COVID-19 virus 06/07/2019  . Hypokalemia 06/07/2019  . AKI (acute kidney injury) (HCC) 06/07/2019  . Nonspeaking deaf 06/07/2019  . Acute hypoxemic respiratory failure due to COVID-19 (HCC) 05/27/2019  . COVID-19 virus infection 05/27/2019    History reviewed. No pertinent surgical  history.  Prior to Admission medications   Medication Sig Start Date End Date Taking? Authorizing Provider  acetaminophen (TYLENOL) 500 MG tablet Take 500 mg by mouth every 8 (eight) hours as needed for moderate pain or fever.    [provider]  albuterol (PROVENTIL) (2.5 MG/3ML) 0.083% nebulizer solution Take 2.5 mg by nebulization every 6 (six) hours as needed for wheezing or shortness of breath.    [provider]  albuterol (VENTOLIN HFA) 108 (90 Base) MCG/ACT inhaler Inhale 2 puffs into the lungs every 4 (four) hours as needed for wheezing or shortness of breath.    [provider]  alendronate (FOSAMAX) 70 MG tablet Take 70 mg by mouth once a week. Take with a full glass of water on an empty stomach.    [provider]  aluminum-magnesium hydroxide-simethicone (MAALOX) 200-200-20 MG/5ML SUSP Take 30 mLs by mouth 4 (four) times daily -  before meals and at bedtime.    [provider]  azithromycin (ZITHROMAX) 250 MG tablet Daily for 9 days 06/11/19   Enedina Finner, MD  Calcium Carbonate-Vitamin D3 (CALCIUM 600-D) 600-400 MG-UNIT TABS Take 1 tablet by mouth 2 (two) times daily.    [provider]  cetirizine (ZYRTEC) 10 MG tablet Take 10 mg by mouth daily.    [provider]  Cholecalciferol (VITAMIN D) 125 MCG (5000 UT) CAPS Take 5,000 Units by mouth daily.    [provider]  diclofenac sodium (VOLTAREN) 1 % GEL Apply 2  g topically 3 (three) times daily. 01/03/19   [provider]  diltiazem (CARDIZEM CD) 240 MG 24 hr capsule Take 240 mg by mouth daily. 01/11/19   [provider]  diphenhydrAMINE (BENADRYL) 25 MG tablet Take 25 mg by mouth every 6 (six) hours as needed for allergies.    [provider]  escitalopram (LEXAPRO) 10 MG tablet Take 10 mg by mouth at bedtime. 06/08/16   [provider]  feeding supplement, ENSURE ENLIVE, (ENSURE ENLIVE) LIQD Take 237 mLs by mouth 3 (three) times  daily between meals. 06/10/19   Enedina Finner, MD  ferrous sulfate 325 (65 FE) MG tablet Take 325 mg by mouth 2 (two) times daily with a meal.     [provider]  Fluticasone-Salmeterol (ADVAIR) 100-50 MCG/DOSE AEPB Inhale 1 puff into the lungs 2 (two) times daily.    [provider]  furosemide (LASIX) 20 MG tablet Take 20 mg by mouth daily.    [provider]  guaifenesin (ROBITUSSIN) 100 MG/5ML syrup Take 300 mg by mouth 4 (four) times daily as needed for cough.    [provider]  loperamide (IMODIUM A-D) 2 MG tablet Take 2 mg by mouth daily as needed for diarrhea or loose stools.    [provider]  Multiple Vitamin (MULTIVITAMIN WITH MINERALS) TABS tablet Take 1 tablet by mouth daily. 06/11/19   Enedina Finner, MD  omeprazole (PRILOSEC) 20 MG capsule Take 20 mg by mouth daily.    [provider]  predniSONE (DELTASONE) 10 MG tablet Take 3 tablets (30 mg total) by mouth daily with breakfast. Take 30 mg day 1 then  --  day 2 and so on  --20 mg  --   --   -10 mg  --   --  then stop 06/11/19   Enedina Finner, MD  tiZANidine (ZANAFLEX) 4 MG tablet Take 4 mg by mouth daily.    [provider]    Allergies Hydrocodone, Naproxen, Other, Sulfamethoxazole-trimethoprim, and Tramadol  No family history on file.  Social History Social History   Tobacco Use  . Smoking status: Never Smoker  . Smokeless tobacco: Never Used  Substance Use Topics  . Alcohol use: Not Currently  . Drug use: Not Currently    Review of Systems Level 5 caveat:  history/ROS limited by acute/critical illness as well as her chronic hearing impairment and she does not communicate via ASL.    PHYSICAL EXAM:  VITAL SIGNS: ED Triage Vitals  Enc Vitals Group     BP 01/21/20 0257 135/72     Pulse Rate 01/21/20 0257 (!) 106     Resp 01/21/20 0257 (!) 23     Temp 01/21/20 0257 97.7 F (36.5 C)     Temp Source 01/21/20 0257 Axillary     SpO2  01/21/20 0257 (!) 76 %     Weight 01/21/20 0259 55.9 kg (123 lb 3.8 oz)     Height 01/21/20 0259 1.676 m ( )     Head Circumference --      Peak Flow --      Pain Score --      Pain Loc --      Pain Edu? --      Excl. in GC? --     Constitutional: Awake and alert, seems to be responding appropriately in spite of inability to communicate easily.  She does read some lips and is able to read and answer simple yes or no questions presented to her and I  written format. Eyes: Conjunctivae are normal.  Head: Atraumatic. Nose: No congestion/rhinnorhea. Mouth/Throat: Patient is wearing a mask. Neck: No stridor.  No meningeal signs.   Cardiovascular: Tachycardia, regular rhythm. Good peripheral circulation. Grossly normal heart sounds. Respiratory: Increased respiratory effort and rate, minimal coarse breath sounds throughout with some expiratory wheezing. Gastrointestinal: Soft and nontender. No distention.  Musculoskeletal: Pain and tenderness in the right hip with any movement of the leg.  She has a drawn up and cannot easily move it or extend it.  No obvious gross deformity. Neurologic: Moving all 4 extremities without difficulty.  Unable to communicate verbally but is able to read and answer yes and no questions. Psychiatric: Mood and affect seem appropriate under the circumstances.  ____________________________________________   LABS (all labs ordered are listed, but only abnormal results are displayed)  Labs Reviewed  BRAIN NATRIURETIC PEPTIDE - Abnormal; Notable for the following components:      Result Value   B Natriuretic Peptide 191.6 (*)    All other components within normal limits  COMPREHENSIVE METABOLIC PANEL - Abnormal; Notable for the following components:   Sodium 133 (*)    Chloride 96 (*)    Glucose, Bld 112 (*)    BUN 31 (*)    Creatinine, Ser 1.06 (*)    GFR calc non Af Amer 50 (*)    GFR calc Af Amer 58 (*)    All other components within normal limits  CBC  WITH DIFFERENTIAL/PLATELET - Abnormal; Notable for the following components:   WBC 10.8 (*)    RBC 3.20 (*)    Hemoglobin 9.8 (*)    HCT 29.4 (*)    Neutro Abs 8.0 (*)    All other components within normal limits  TROPONIN I (HIGH SENSITIVITY) - Abnormal; Notable for the following components:   Troponin I (High Sensitivity) 245 (*)    All other components within normal limits  SARS CORONAVIRUS 2 BY RT PCR (HOSPITAL ORDER, PERFORMED IN [facility])  PROTIME-INR  TROPONIN I (HIGH SENSITIVITY)   ____________________________________________  EKG  ED ECG REPORT I, Loleta Rose, the attending physician, personally viewed and interpreted this ECG.  Date: 01/21/2020 EKG Time: 2:57 AM Rate: 108 Rhythm: Sinus tachycardia QRS Axis: normal Intervals: Left anterior fascicular block, LVH with secondary repolarization abnormality ST/T Wave abnormalities: Non-specific ST segment / T-wave changes, but no clear evidence of acute ischemia. Narrative Interpretation: no definitive evidence of acute ischemia; does not meet STEMI criteria.   ____________________________________________  RADIOLOGY I, Loleta Rose, personally viewed and evaluated these images (plain radiographs) as part of my medical decision making, as well as reviewing the written report by the radiologist.  No PE on CTA chest.  Questionable stress fracture on femur CT.  ED MD interpretation: COPD changes, otherwise unremarkable chest x-ray  Official radiology report(s): CT Angio Chest PE W/Cm &/Or Wo Cm  Result Date: 01/21/2020 CLINICAL DATA:  Fall and shortness of breath. EXAM: CT ANGIOGRAPHY CHEST WITH CONTRAST TECHNIQUE: Multidetector CT imaging of the chest was performed using the standard protocol during bolus administration of intravenous contrast. Multiplanar CT image reconstructions and MIPs were obtained to evaluate the vascular anatomy. CONTRAST:  39mL OMNIPAQUE IOHEXOL 350 MG/ML SOLN COMPARISON:  06/09/2019  FINDINGS: Cardiovascular: Prominent right ventricular size. No pericardial effusion. Aortic and great vessel atherosclerotic calcification. No pulmonary artery filling defect is seen. There is significant motion artifact at the lung bases. Mediastinum/Nodes: Small to moderate sliding hiatal hernia. No adenopathy. Lungs/Pleura: Opacity in the right  lower lobe with volume loss. No edema, effusion, or pneumothorax. Panlobular emphysema. Dystrophic calcifications in the apical lungs. Bronchomalacia. Upper Abdomen: Atherosclerotic calcification. Musculoskeletal: Healing anterior and lateral left rib fractures. Review of the MIP images confirms the above findings. IMPRESSION: 1. Right lower lobe atelectasis versus pneumonia. 2. Moderately motion degraded chest CTA. No evidence of pulmonary embolism. 3. Advanced emphysema. Aortic Atherosclerosis (ICD10-I70.0) and Emphysema (ICD10-J43.9). Electronically Signed   By: Marnee Spring M.D.   On: 01/21/2020 05:55   CT Hip Right Wo Contrast  Result Date: 01/21/2020 CLINICAL DATA:  Hip trauma.  Fracture suspected. EXAM: CT OF THE RIGHT HIP WITHOUT CONTRAST TECHNIQUE: Multidetector CT imaging of the right hip was performed according to the standard protocol. Multiplanar CT image reconstructions were also generated. COMPARISON:  Hip x-rays from earlier same day. FINDINGS: Bones/Joint/Cartilage Linear lucency identified in the lateral cortex of the proximal right femoral diaphysis associated with focal cortical thickening. Imaging features compatible with nondisplaced stress fracture. The abnormality is included on the extreme inferior aspect of this study and is best visualized on sagittal reconstruction image 21 of series 5. Degenerative changes are noted in the right hip. Ligaments Suboptimally assessed by CT. Muscles and Tendons No substantial hemorrhage noted in the proximal thigh musculature. Soft tissues Distended urinary bladder is incompletely visualized. IMPRESSION:  Linear lucency in the lateral cortex of the proximal right femoral diaphysis associated with focal cortical thickening. Imaging features compatible with nondisplaced stress fracture. Consider MRI of the proximal right femur for definitive characterization. Electronically Signed   By: Kennith Center M.D.   On: 01/21/2020 06:02   DG Chest Portable 1 View  Result Date: 01/21/2020 CLINICAL DATA:  Larey Seat, short of breath, hypoxia EXAM: PORTABLE CHEST 1 VIEW COMPARISON:  06/07/2019 FINDINGS: Single frontal view of the chest demonstrates a stable cardiac silhouette. Extensive bullous emphysematous changes are seen, greatest at the right apex. Marked areas of scarring and fibrosis are seen throughout the lungs. Increased density at the right costophrenic angle may reflect consolidation and/or effusion. Chronic posttraumatic changes of the thoracic cage. No acute displaced fractures. IMPRESSION: 1. 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 2. Density at the right lateral lung base may reflect focal lung consolidation and/or small effusion. Electronically Signed   By: Sharlet Salina M.D.   On: 01/21/2020 03:48   DG Hip Unilat W or Wo Pelvis 2-3 Views Right  Result Date: 01/21/2020 CLINICAL DATA:  Larey Seat, pain EXAM: DG HIP (WITH OR WITHOUT PELVIS) 2-3V RIGHT COMPARISON:  01/31/2017 FINDINGS: Frontal view of the pelvis as well as frontal and cross-table lateral views of the right hip are obtained. There is a nondisplaced transverse fracture through the proximal femoral diaphysis. Alignment is anatomic. There are no other acute displaced fractures. The hips are well aligned. Mild bilateral hip osteoarthritis. Sacroiliac joints are normal. IMPRESSION: 1. Nondisplaced transverse fracture through the proximal right femoral diaphysis. Fracture line involves an area of chronic focal cortical thickening, and likely reflects an acute fracture at a site of chronic stress. Electronically Signed   By:  Sharlet Salina M.D.   On: 01/21/2020 03:46    ____________________________________________   PROCEDURES   Procedure(s) performed (including Critical Care):  .Critical Care Performed by: Loleta Rose, MD Authorized by: Loleta Rose, MD   Critical care provider statement:    Critical care time (minutes):  30   Critical care time was exclusive of:  Separately billable procedures and treating other patients   Critical care was necessary to treat  or prevent imminent or life-threatening deterioration of the following conditions:  Respiratory failure and cardiac failure   Critical care was time spent personally by me on the following activities:  Development of treatment plan with patient or surrogate, discussions with consultants, evaluation of patient's response to treatment, examination of patient, obtaining history from patient or surrogate, ordering and performing treatments and interventions, ordering and review of laboratory studies, ordering and review of radiographic studies, pulse oximetry, re-evaluation of patient's condition and review of old charts .1-3 Lead EKG Interpretation Performed by: Loleta RoseForbach, Davison Ohms, MD Authorized by: Loleta RoseForbach, Karma Hiney, MD     Interpretation: abnormal     ECG rate:  110   ECG rate assessment: tachycardic     Rhythm: sinus tachycardia     Ectopy: none     Conduction: normal       ____________________________________________   INITIAL IMPRESSION / MDM / ASSESSMENT AND PLAN / ED COURSE  As part of my medical decision making, I reviewed the following data within the electronic MEDICAL RECORD NUMBER Nursing notes reviewed and incorporated, Labs reviewed , EKG interpreted , Old chart reviewed, Radiograph reviewed , Discussed with admitting physician (Dr. Arville CareMansy), Discussed with orthopedic surgeon (Dr. Odis LusterBowers) and reviewed Notes from prior ED visits   Differential diagnosis includes, but is not limited to, COPD exacerbation, CHF exacerbation, ACS/NSTEMI, PE,  COVID-19, pneumonia.  The patient had a fall which was the cause of her initial EMS call but she is in severe respiratory distress.  She was started to do better after arrival and all the prehospital treatment, so I held off on starting BiPAP.  She maintain her oxygenation at about 93% on 6 L by nasal cannula but she is in no distress and she now has clear lung sounds.  She was doing much better after the treatment for COPD and her chest x-ray is consistent with COPD, but given the initial tachycardia, the hypoxemia, and the chest pain, I am going to obtain a CTA chest to rule out pulmonary embolism.  However she also has a slightly positive troponin of 245 and I have started heparin bolus plus infusion for possible NSTEMI (as well as the possibility of PE).  Other than the troponin, her lab work is generally reassuring.  Comprehensive metabolic panel is essentially normal other than a very slightly elevated BUN and creatinine.  BNP is slightly elevated at 191.6.  CBC is essentially normal.  EKG shows no obvious evidence of ischemia.  The patient has a probable nondisplaced acute fracture along the femoral diaphysis of the right hip.  I discussed the case with Dr. Odis LusterBowers with orthopedic surgery and he requested that I obtain a CT of the hip and he would be happy to consult on the patient since she will require medical admission and stabilization regardless.  The patient is on the cardiac monitor to evaluate for evidence of arrhythmia and/or significant heart rate changes.  Hospitalist has been consulted for admission.     Clinical Course as of Jan 20 645  Wed Jan 21, 2020  0438 SARS Coronavirus 2: NEGATIVE [CF]  0501 Discussed case by phone with Dr. Arville CareMansy.  He will admit.  He knows that the scans are pending.   [CF]  0509 Pam with CT called me and let me know that the patient is quite claustrophobic.  She was able to get the CT hip but thus far has not been able to get a CTA chest.  Given the  risks of undiagnosed PE versus  the risk of medication, I have ordered Versed 1 mg IV to help as a calming agent while the patient gets her scan.  Her nurse is going to go over with her and administer the medicine and stay nearby with a bag-valve-mask and a nonrebreather mask in case her respiratory status declines after the Versed.   [CF]    Clinical Course User Index [CF] Loleta Rose, MD     ____________________________________________  FINAL CLINICAL IMPRESSION(S) / ED DIAGNOSES  Final diagnoses:  Acute on chronic respiratory failure with hypoxia (HCC)  Chronic obstructive pulmonary disease with acute exacerbation (HCC)  Elevated troponin level  Demand ischemia (HCC)  Fall, initial encounter  Nondisplaced fracture of base of neck of right femur, initial encounter for closed fracture (HCC)  Nonspeaking deaf     MEDICATIONS GIVEN DURING THIS VISIT:  Medications  morphine 4 MG/ML injection 4 mg (has no administration in time range)  ondansetron (ZOFRAN) injection 4 mg (has no administration in time range)  ipratropium-albuterol (DUONEB) 0.5-2.5 (3) MG/3ML nebulizer solution 3 mL (has no administration in time range)  ipratropium-albuterol (DUONEB) 0.5-2.5 (3) MG/3ML nebulizer solution 3 mL (has no administration in time range)  aspirin chewable tablet 324 mg (has no administration in time range)     ED Discharge Orders    None      *Please note:  Alice Wallace was evaluated in Emergency Department on 01/21/2020 for the symptoms described in the history of present illness. She was evaluated in the context of the global COVID-19 pandemic, which necessitated consideration that the patient might be at risk for infection with the SARS-CoV-2 virus that causes COVID-19. Institutional protocols and algorithms that pertain to the evaluation of patients at risk for COVID-19 are in a state of rapid change based on information released by regulatory bodies including the CDC and federal and  state organizations. These policies and algorithms were followed during the patient's care in the ED.  Some ED evaluations and interventions may be delayed as a result of limited staffing during and after the pandemic.*  Note:  This document was prepared using Dragon voice recognition software and may include unintentional dictation errors.   Loleta Rose, MD 01/21/20 [id number]

## 2020-01-21 NOTE — Consult Note (Signed)
CARDIOLOGY CONSULT NOTE               Patient IDKacelyn Wallace MRN: [id number] DOB/AGE: [birth year] 79 y.o.  Admit date: 01/21/2020 Referring Physician Dr. Andria Rhein  Primary Physician Harvin Hazel NP Primary Cardiologist n/a  Reason for Consultation Elevated troponin, preoperative cardiac clearance   HPI: Ms. Name is a 79 year old female with a past medical history significant for oxygen dependent COPD, hypertension, and hearing impairment who presented to the ED on 01/21/20 following a fall with subsequent right hip pain.  Upon arrival, she was hypoxic on 2L, O2 sats in the 70s with associated wheezing.  High sensitivity troponin 245 and 1281 respectively, BNP of 191, and hemoglobin of 9.8. Chest CT revealed right lower lobe atelectasis vs. pneumonia, advanced emphysema, but no evidence of a PE. ECG revealed sinus tachycardia with borderline ST/T wave abnormalities in inferior and anterior leads with IVCD. CT hip revealed possible right proximal femoral shaft fracture.   01/21/20: Alice Wallace is unable to provide any history and no family members at bedside.   Review of systems complete and found to be negative unless listed above     Past Medical History:  Diagnosis Date  . COPD (chronic obstructive pulmonary disease) (HCC)   . Deaf   . Hypertension   . Osteoporosis     History reviewed. No pertinent surgical history.  (Not in a hospital admission)  Social History   Socioeconomic History  . Marital status: Single    Spouse name: Not on file  . Number of children: Not on file  . Years of education: Not on file  . Highest education level: Not on file  Occupational History  . Not on file  Tobacco Use  . Smoking status: Never Smoker  . Smokeless tobacco: Never Used  Substance and Sexual Activity  . Alcohol use: Not Currently  . Drug use: Not Currently  . Sexual activity: Not on file  Other Topics Concern  . Not on file  Social History Narrative  . Not on file    Social Determinants of Health   Financial Resource Strain:   . Difficulty of Paying Living Expenses: Not on file  Food Insecurity:   . Worried About Programme researcher, broadcasting/film/video in the Last Year: Not on file  . Ran Out of Food in the Last Year: Not on file  Transportation Needs:   . Lack of Transportation (Medical): Not on file  . Lack of Transportation (Non-Medical): Not on file  Physical Activity:   . Days of Exercise per Week: Not on file  . Minutes of Exercise per Session: Not on file  Stress:   . Feeling of Stress : Not on file  Social Connections:   . Frequency of Communication with Friends and Family: Not on file  . Frequency of Social Gatherings with Friends and Family: Not on file  . Attends Religious Services: Not on file  . Active Member of Clubs or Organizations: Not on file  . Attends Banker Meetings: Not on file  . Marital Status: Not on file  Intimate Partner Violence:   . Fear of Current or Ex-Partner: Not on file  . Emotionally Abused: Not on file  . Physically Abused: Not on file  . Sexually Abused: Not on file    No family history on file.    Review of systems complete and found to be negative unless listed above     PHYSICAL EXAM  General: Sleeping, uncommunicative  HEENT:  Normocephalic and atramatic Neck:  No JVD.  Lungs: On 6 Liters supplemental O2 Heart: HRRR . Normal S1 and S2 without gallops or murmurs.  Abdomen: Bowel sounds are positive, abdomen soft and non-tender  Msk:  Back normal.  Normal strength and tone for age. Extremities: No clubbing, cyanosis or edema.   Neuro: N/A Psych:  N/A  Labs:   Lab Results  Component Value Date   WBC 10.8 (H) 01/21/2020   HGB 9.8 (L) 01/21/2020   HCT 29.4 (L) 01/21/2020   MCV 91.9 01/21/2020   PLT 278 01/21/2020    Recent Labs  Lab 01/21/20 0253  NA 133*  K 4.2  CL 96*  CO2 23  BUN 31*  CREATININE 1.06*  CALCIUM 8.9  PROT 7.2  BILITOT 1.2  ALKPHOS 59  ALT 16  AST 25   GLUCOSE 112*   No results found for: CKTOTAL, CKMB, CKMBINDEX, TROPONINI No results found for: CHOL No results found for: HDL No results found for: LDLCALC No results found for: TRIG No results found for: CHOLHDL No results found for: LDLDIRECT    Radiology: CT Angio Chest PE W/Cm &/Or Wo Cm  Result Date: 01/21/2020 CLINICAL DATA:  Fall and shortness of breath. EXAM: CT ANGIOGRAPHY CHEST WITH CONTRAST TECHNIQUE: Multidetector CT imaging of the chest was performed using the standard protocol during bolus administration of intravenous contrast. Multiplanar CT image reconstructions and MIPs were obtained to evaluate the vascular anatomy. CONTRAST:  12mL OMNIPAQUE IOHEXOL 350 MG/ML SOLN COMPARISON:  06/09/2019 FINDINGS: Cardiovascular: Prominent right ventricular size. No pericardial effusion. Aortic and great vessel atherosclerotic calcification. No pulmonary artery filling defect is seen. There is significant motion artifact at the lung bases. Mediastinum/Nodes: Small to moderate sliding hiatal hernia. No adenopathy. Lungs/Pleura: Opacity in the right lower lobe with volume loss. No edema, effusion, or pneumothorax. Panlobular emphysema. Dystrophic calcifications in the apical lungs. Bronchomalacia. Upper Abdomen: Atherosclerotic calcification. Musculoskeletal: Healing anterior and lateral left rib fractures. Review of the MIP images confirms the above findings. IMPRESSION: 1. Right lower lobe atelectasis versus pneumonia. 2. Moderately motion degraded chest CTA. No evidence of pulmonary embolism. 3. Advanced emphysema. Aortic Atherosclerosis (ICD10-I70.0) and Emphysema (ICD10-J43.9). Electronically Signed   By: Marnee Spring M.D.   On: 01/21/2020 05:55   CT Hip Right Wo Contrast  Result Date: 01/21/2020 CLINICAL DATA:  Hip trauma.  Fracture suspected. EXAM: CT OF THE RIGHT HIP WITHOUT CONTRAST TECHNIQUE: Multidetector CT imaging of the right hip was performed according to the standard protocol.  Multiplanar CT image reconstructions were also generated. COMPARISON:  Hip x-rays from earlier same day. FINDINGS: Bones/Joint/Cartilage Linear lucency identified in the lateral cortex of the proximal right femoral diaphysis associated with focal cortical thickening. Imaging features compatible with nondisplaced stress fracture. The abnormality is included on the extreme inferior aspect of this study and is best visualized on sagittal reconstruction image 21 of series 5. Degenerative changes are noted in the right hip. Ligaments Suboptimally assessed by CT. Muscles and Tendons No substantial hemorrhage noted in the proximal thigh musculature. Soft tissues Distended urinary bladder is incompletely visualized. IMPRESSION: Linear lucency in the lateral cortex of the proximal right femoral diaphysis associated with focal cortical thickening. Imaging features compatible with nondisplaced stress fracture. Consider MRI of the proximal right femur for definitive characterization. Electronically Signed   By: Kennith Center M.D.   On: 01/21/2020 06:02   DG Chest Portable 1 View  Result Date: 01/21/2020 CLINICAL DATA:  Larey Seat, short of breath, hypoxia EXAM:  PORTABLE CHEST 1 VIEW COMPARISON:  06/07/2019 FINDINGS: Single frontal view of the chest demonstrates a stable cardiac silhouette. Extensive bullous emphysematous changes are seen, greatest at the right apex. Marked areas of scarring and fibrosis are seen throughout the lungs. Increased density at the right costophrenic angle may reflect consolidation and/or effusion. Chronic posttraumatic changes of the thoracic cage. No acute displaced fractures. IMPRESSION: 1. 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 2. Density at the right lateral lung base may reflect focal lung consolidation and/or small effusion. Electronically Signed   By: Sharlet Salina M.D.   On: 01/21/2020 03:48   DG Hip Unilat W or Wo Pelvis 2-3 Views Right  Result Date:  01/21/2020 CLINICAL DATA:  [place] Seat, pain EXAM: DG HIP (WITH OR WITHOUT PELVIS) 2-3V RIGHT COMPARISON:  01/31/2017 FINDINGS: Frontal view of the pelvis as well as frontal and cross-table lateral views of the right hip are obtained. There is a nondisplaced transverse fracture through the proximal femoral diaphysis. Alignment is anatomic. There are no other acute displaced fractures. The hips are well aligned. Mild bilateral hip osteoarthritis. Sacroiliac joints are normal. IMPRESSION: 1. Nondisplaced transverse fracture through the proximal right femoral diaphysis. Fracture line involves an area of chronic focal cortical thickening, and likely reflects an acute fracture at a site of chronic stress. Electronically Signed   By: Sharlet Salina M.D.   On: 01/21/2020 03:46    ASSESSMENT AND PLAN:  1.  Acute on chronic COPD exacerbation   -On 2 L of baseline, currently on 6L   -Would continue to treat with steroids/nebulizer, antibiotics    2.  Elevated troponin   -Appears in the absence of chest pain   -Due to patient's underlying condition, we will opt to treat medically at this time; no plan for invasive workup  -Will remain on heparin for now  -Echocardiogram pending   3.  Preoperative cardiac clearance   -Patient seen and evaluated by orthopedics; no plan for surgical intervention at this time   The history, physical exam findings, and plan of care were all discussed with Dr. Harold Hedge, and all decision making was made in collaboration.   Signed: Andi Hence PA-C 01/21/2020, 8:42 AM

## 2020-01-21 NOTE — ED Notes (Signed)
This RN messaged pharmacy for med verification.  

## 2020-01-21 NOTE — Progress Notes (Signed)
ANTICOAGULATION CONSULT NOTE - Initial Consult  Pharmacy Consult for Heparin Indication: chest pain/ACS  Allergies  Allergen Reactions  . Hydrocodone     Other reaction(s): Unknown  . Naproxen     Other reaction(s): Unknown  . Other Other (See Comments)    nuts  . Sulfamethoxazole-Trimethoprim     Other reaction(s): Unknown  . Tramadol     Other reaction(s): Unknown Pt has tolerated this med 1/5-1/7 with no issues    Patient Measurements: Height: 5\' 6"  (167.6 cm) Weight: 55.9 kg (123 lb 3.8 oz) IBW/kg (Calculated) : 59.3 HEPARIN DW (KG): 55.9  Vital Signs: Temp: 97.7 F (36.5 C) (09/01 0257) Temp Source: Axillary (09/01 0257) BP: 129/69 (09/01 0400) Pulse Rate: 96 (09/01 0415)  Labs: Recent Labs    01/21/20 0253  HGB 9.8*  HCT 29.4*  PLT 278  LABPROT 13.5  INR 1.1  CREATININE 1.06*  TROPONINIHS 245*    Estimated Creatinine Clearance: 38 mL/min (A) (by C-G formula based on SCr of 1.06 mg/dL (H)).   Medical History: Past Medical History:  Diagnosis Date  . COPD (chronic obstructive pulmonary disease) (HCC)   . Deaf   . Hypertension   . Osteoporosis     Medications:  (Not in a hospital admission)   Assessment: Heparin for ACS.  No anticoagulants PTA.  Baseline labs ordered.  Goal of Therapy:  Heparin level 0.3-0.7 units/ml Monitor platelets by anticoagulation protocol: Yes   Plan:  Heparin 3000 units bolus then infusion at 650 units/hr Check HL ~ 8 hours after heparin started  03/22/20 A 01/21/2020,4:53 AM

## 2020-01-21 NOTE — Progress Notes (Signed)
*  PRELIMINARY RESULTS* Echocardiogram 2D Echocardiogram has been performed.  Cristela Blue 01/21/2020, 2:13 PM

## 2020-01-21 NOTE — Progress Notes (Signed)
ANTICOAGULATION CONSULT NOTE   Pharmacy Consult for Heparin Indication: chest pain/ACS  Allergies  Allergen Reactions  . Hydrocodone     Other reaction(s): Unknown  . Naproxen     Other reaction(s): Unknown  . Other Other (See Comments)    nuts  . Sulfamethoxazole-Trimethoprim     Other reaction(s): Unknown  . Tramadol     Other reaction(s): Unknown Pt has tolerated this med 1/5-1/7 with no issues    Patient Measurements: Height: 5\' 6"  (167.6 cm) Weight: 55.9 kg (123 lb 3.8 oz) IBW/kg (Calculated) : 59.3 HEPARIN DW (KG): 55.9  Vital Signs: BP: 126/64 (09/01 2200) Pulse Rate: 92 (09/01 2200)  Labs: Recent Labs    01/21/20 0253 01/21/20 0540 01/21/20 0541 01/21/20 1446 01/21/20 2154  HGB 9.8*  --   --   --   --   HCT 29.4*  --   --   --   --   PLT 278  --   --   --   --   APTT  --  29  --   --   --   LABPROT 13.5  --   --   --   --   INR 1.1  --   --   --   --   HEPARINUNFRC  --   --   --  0.12* 0.19*  CREATININE 1.06*  --   --   --   --   TROPONINIHS 245*  --  1,281*  --   --     Estimated Creatinine Clearance: 38 mL/min (A) (by C-G formula based on SCr of 1.06 mg/dL (H)).   Medical History: Past Medical History:  Diagnosis Date  . COPD (chronic obstructive pulmonary disease) (HCC)   . Deaf   . Hypertension   . Osteoporosis     Medications:  (Not in a hospital admission)  Assessment: Heparin for ACS.  No anticoagulants PTA.  Baseline labs ordered.  09/01 HL @ 1517 0.12. Subtherapeutic. Confirmed no heparin infusion interruptions. 09/01 2154 HL 0.19, SUBtherapeutic.    Goal of Therapy:  Heparin level 0.3-0.7 units/ml Monitor platelets by anticoagulation protocol: Yes   Plan:  Heparin 2000 units bolus then increase infusion to 1000 units/hr. Informed nursing of plan.  Check HL ~8 hours after heparin rate increased  2155, Marget Outten A 01/21/2020,10:33 PM

## 2020-01-21 NOTE — ED Notes (Signed)
Pt placed on 6L [place], admitting provider notified. Pt's current O2 sat 96%.

## 2020-01-21 NOTE — ED Notes (Signed)
Charge RN contacted and informed of pt transfer, per 2A Special educational needs teacher, there is no nurse assigned to this pt and they are currently in process of attempting to figure out who the accepting nurse will be. ED charge made aware.

## 2020-01-21 NOTE — ED Notes (Signed)
Pt transported to CT ?

## 2020-01-21 NOTE — ED Triage Notes (Signed)
Pt arrives [facility] from [place] [facility] [place] for cc of fall and shob. Pt found by staff next tor ecliner, R elbw lac and R hip pain on movement. Pt found to be O2 70's on chronic 2L, pt placed on 15L nonrebreather and improved to 96%. Pt given 125 solumedrol, 2 1/2 duonebs, 5mg  albuterol. Pt wheezing and tachypnea.

## 2020-01-21 NOTE — Progress Notes (Signed)
PROGRESS NOTE    Patient: Alice Wallace                            PCP: Devoria Glassing, NP                    DOB: [birth year]            DOA: 01/21/2020 ION:[id number]             DOS: 01/21/2020, 1:00 PM   LOS: 0 days   Date of Service: The patient was seen and examined on 01/21/2020  Subjective:   The patient was seen and examined this morning, laying in bed, sleeping comfortably, easily arousable but lethargic. Upon my examination was not complaining of any chest pain, on 15 L of nonrebreather will mask, satting 99%  Brief Narrative:   Alice Wallace  is a 79 y.o. Caucasian female with a known history of COPD, hypertension, deafness and muteness, presented to the emergency room with acute onset of fall at her assisted living facility with subsequent right hip pain and significant respiratory distress.  Her pulse currently was down to the 70s on her 2 L O2 by nasal cannula with associated coarse breath sounds and wheezing.  She was subsequently increased to 6 L, then 15 L via nonrebreather mask, to maintain O2 sat greater 92%.   She was thought to be in COPD exacerbation was given IV Solu-Medrol and nebulized albuterol as well as duo nebs by EMS on route to the hospital. She can read lips and written communications.  ED  RR 23, HR 106, 93-96% on 15 L of O2 via NRM   BNP of 191.6, high-sensitivity troponin I of 245 mL 1281 and CBC showed leukocytosis of 10.8 as well as anemia.  COVID-19 PCR came back negative.  EKG showed mild ectopic atrial tachycardia with  incomplete RBB, LVH with T wave inversion laterally.  Treated with aspirin, IV heparin, nebs, IV Versed, morphine Chest CTA that ruled out PE and showed right lower lobe atelectasis CT scan since suspicious for nondisplaced stress fracture of the proximal right femoral diaphysis with consideration for MRI.  [name] was notified about the patient.     Assessment & Plan:   Active Problems:   COPD exacerbation  (HCC)   Acute on chronic respiratory failure with COPD exacerbation /bronchitis, cannot rule out pneumonia  -Patient remains on 15 L of oxygen via nonrebreather mask --plan to wean patient down to minimum oxygen supplement, to maintain O2 sats greater than 92%  -RT consulted, scheduled and as needed DuoNeb bronchodilators -We will continue IV steroids -Empiric antibiotics IV Rocephin, azithromycin -We will place her on IV Rocephin and Zithromax.. -We will hold off Advair Diskus.  Suspected non-STEMI. -Patient is currently chest pain free -Elevated troponin ... Trending up -Nonspecific EKG changes -The patient will be continued on IV heparin. -She was given aspirin and will be placed on high-dose statin and as needed sublingual nitroglycerin and morphine sulfate. -2D echo and a cardiology consult will be obtained. -Notified Dr. Azucena Cecil with [facility] about the patient Appreciate cardiology follow-up and input  Hypertension. -Monitor very closely, continue home medication Cardizem CD -Patient is currently receiving as needed nitroglycerin -   Proximal right femoral diaphysis fracture -Ortho consulted -stating that fracture is suspicious of bisphosphonate related side effect recommending discontinuing alendronate Ortho recommending nonweightbearing on right hip, close follow-up in clinic Stating will likely need prophylactic femoral  nailing   Nutritional status:          Consultants:  Cardiology/orthopedic   ---------------------------------------------------------------------------------------------------------------------------------------------DVT prophylaxis:  Heparin drip Code Status:   Code Status: Full Code Family Communication: No family member present at bedside- attempt will be made to update daily The above findings and plan of care has been discussed with patient (and family )  in detail,  they expressed understanding and agreement of above. -Advance care  planning has been discussed.   Admission status:    Status is: Inpatient  Remains inpatient appropriate because:Inpatient level of care appropriate due to severity of illness   Dispo: The patient is from: Home              Anticipated d/c is to: [facility]               Anticipated d/c date is: > 3 days              Patient currently is not medically stable to d/c.        Procedures:   No admission procedures for hospital encounter.   Antimicrobials:  Anti-infectives (From admission, onward)   Start     Dose/Rate Route Frequency Ordered Stop   01/21/20 1530  ceFAZolin (ANCEF) IVPB 2g/100 mL premix  Status:  Discontinued        2 g 200 mL/hr over 30 Minutes Intravenous  Once 01/21/20 0738 01/21/20 1009   01/21/20 0730  azithromycin (ZITHROMAX) 500 mg in sodium chloride 0.9 % 250 mL IVPB        500 mg 250 mL/hr over 60 Minutes Intravenous Every 24 hours 01/21/20 0615     01/21/20 0700  cefTRIAXone (ROCEPHIN) 1 g in sodium chloride 0.9 % 100 mL IVPB        1 g 200 mL/hr over 30 Minutes Intravenous Every 24 hours 01/21/20 0615         Medication:  . alum & mag hydroxide-simeth  30 mL Oral TID AC & HS  . atorvastatin  80 mg Oral Daily  . calcium-vitamin D  1 tablet Oral BID  . cholecalciferol  5,000 Units Oral Daily  . diltiazem  240 mg Oral Daily  . escitalopram  10 mg Oral Daily  . feeding supplement (ENSURE ENLIVE)  237 mL Oral TID BM  . ferrous sulfate  325 mg Oral BID WC  . guaiFENesin  600 mg Oral Daily  . ipratropium-albuterol  3 mL Nebulization QID  . loratadine  10 mg Oral Daily  . methylPREDNISolone (SOLU-MEDROL) injection  40 mg Intravenous Q8H  . metolazone  2.5 mg Oral BID  . pantoprazole  40 mg Oral Daily  . potassium chloride SA  40 mEq Oral Daily  . tiZANidine  4 mg Oral Daily    acetaminophen **OR** acetaminophen, acetaminophen, albuterol, diphenhydrAMINE, loperamide, magnesium hydroxide, magnesium hydroxide, morphine injection, morphine  injection, nitroGLYCERIN, nitroGLYCERIN, ondansetron **OR** ondansetron (ZOFRAN) IV, ondansetron, traZODone   Objective:   Vitals:   01/21/20 0415 01/21/20 0430 01/21/20 0600 01/21/20 1200  BP:  125/70 116/64 113/77  Pulse: 96 93 93 79  Resp: 19 (!) 22 20 16   Temp:      TempSrc:      SpO2: 95% 93% 96% 100%  Weight:      Height:       No intake or output data in the 24 hours ending 01/21/20 1300 Filed Weights   01/21/20 0259  Weight: 55.9 kg     Examination:  Physical Exam  Constitution:  Alert, cooperative, no distress,  Appears calm and comfortable  Psychiatric: Normal and stable mood and affect, cognition intact,   HEENT: Normocephalic, PERRL, otherwise with in Normal limits  Chest:Chest symmetric Cardio vascular:  S1/S2, RRR, No murmure, No Rubs or Gallops  pulmonary: Clear to auscultation bilaterally, respirations unlabored, negative wheezes / crackles Abdomen: Soft, non-tender, non-distended, bowel sounds,no masses, no organomegaly Muscular skeletal: Limited exam - in bed, able to move all 4 extremities, right hip pain, limited range of motion due to pain Neuro: CNII-XII intact. , normal motor and sensation, reflexes intact  Extremities: No pitting edema lower extremities, +2 pulses  Skin: Dry, warm to touch, negative for any Rashes, No open wounds Wounds: per nursing documentation Pressure Injury 05/27/19 Ischial tuberosity Left Unstageable - Full thickness tissue loss in which the base of the injury is covered by slough (yellow, tan, gray, green or brown) and/or eschar (tan, brown or black) in the wound bed. (Active)  05/27/19 1630  Location: Ischial tuberosity  Location Orientation: Left  Staging: Unstageable - Full thickness tissue loss in which the base of the injury is covered by slough (yellow, tan, gray, green or brown) and/or eschar (tan, brown or black) in the wound bed.  Wound Description (Comments):   Present on Admission: Yes     ------------------------------------------------------------------------------------------------------------------------------------------    LABs:  CBC Latest Ref Rng & Units 01/21/2020 06/08/2019 06/07/2019  WBC 4.0 - 10.5 K/uL 10.8(H) 11.5(H) 16.3(H)  Hemoglobin 12.0 - 15.0 g/dL 4.4(I) 10.5(L) 11.2(L)  Hematocrit 36 - 46 % 29.4(L) 32.7(L) 34.0(L)  Platelets 150 - 400 K/uL 278 343 392   CMP Latest Ref Rng & Units 01/21/2020 06/08/2019 06/07/2019  Glucose 70 - 99 mg/dL 347(Q) 259(D) 638(V)  BUN 8 - 23 mg/dL 56(E) 33(I) 95(J)  Creatinine 0.44 - 1.00 mg/dL 8.84(Z) 6.60 6.30(Z)  Sodium 135 - 145 mmol/L 133(L) 137 138  Potassium 3.5 - 5.1 mmol/L 4.2 3.7 3.7  Chloride 98 - 111 mmol/L 96(L) 100 98  CO2 22 - 32 mmol/L 23 28 28   Calcium 8.9 - 10.3 mg/dL 8.9 ) 9.1  Total Protein 6.5 - 8.1 g/dL 7.2 6.9 7.1  Total Bilirubin 0.3 - 1.2 mg/dL 1.2 0.7 0.8  Alkaline Phos 38 - 126 U/L 59 49 53  AST 15 - 41 U/L 25 18 19   ALT 0 - 44 U/L 16 15 14        Micro Results Recent Results (from the past 240 hour(s))  SARS Coronavirus 2 by RT PCR (hospital order, performed in [facility] hospital lab) Nasopharyngeal Nasopharyngeal Swab     Status: None   Collection Time: 01/21/20  2:53 AM   Specimen: Nasopharyngeal Swab  Result Value Ref Range Status   SARS Coronavirus 2 NEGATIVE NEGATIVE Final    Comment: (NOTE) SARS-CoV-2 target nucleic acids are NOT DETECTED.  The SARS-CoV-2 RNA is generally detectable in upper and lower respiratory specimens during the acute phase of infection. The lowest concentration of SARS-CoV-2 viral copies this assay can detect is 250 copies / mL. A negative result does not preclude SARS-CoV-2 infection and should not be used as the sole basis for treatment or other patient management decisions.  A negative result may occur with improper specimen collection / handling, submission of specimen other than nasopharyngeal swab, presence of viral mutation(s) within  the areas targeted by this assay, and inadequate number of viral copies (<250 copies / mL). A negative result must be combined with clinical observations, patient history, and  epidemiological information.  Fact Sheet for Patients:   BoilerBrush.com.cyhttps://www.fda.gov/media/136312/download  Fact Sheet for Healthcare Providers: https://pope.com/https://www.fda.gov/media/136313/download  This test is not yet approved or  cleared by the [place] FDA and has been authorized for detection and/or diagnosis of SARS-CoV-2 by FDA under an Emergency Use Authorization (EUA).  This EUA will remain in effect (meaning this test can be used) for the duration of the COVID-19 declaration under Section 564(b)(1) of the Act, 21 U.S.C. section 360bbb-3(b)(1), unless the authorization is terminated or revoked sooner.  Performed at [facility], [address] [place], [place] [address]     Radiology Reports CT Angio Chest PE W/Cm &/Or Wo Cm  Result Date: 01/21/2020 CLINICAL DATA:  Fall and shortness of breath. EXAM: CT ANGIOGRAPHY CHEST WITH CONTRAST TECHNIQUE: Multidetector CT imaging of the chest was performed using the standard protocol during bolus administration of intravenous contrast. Multiplanar CT image reconstructions and MIPs were obtained to evaluate the vascular anatomy. CONTRAST:  75mL OMNIPAQUE IOHEXOL 350 MG/ML SOLN COMPARISON:  06/09/2019 FINDINGS: Cardiovascular: Prominent right ventricular size. No pericardial effusion. Aortic and great vessel atherosclerotic calcification. No pulmonary artery filling defect is seen. There is significant motion artifact at the lung bases. Mediastinum/Nodes: Small to moderate sliding hiatal hernia. No adenopathy. Lungs/Pleura: Opacity in the right lower lobe with volume loss. No edema, effusion, or pneumothorax. Panlobular emphysema. Dystrophic calcifications in the apical lungs. Bronchomalacia. Upper Abdomen: Atherosclerotic calcification. Musculoskeletal: Healing anterior  and lateral left rib fractures. Review of the MIP images confirms the above findings. IMPRESSION: 1. Right lower lobe atelectasis versus pneumonia. 2. Moderately motion degraded chest CTA. No evidence of pulmonary embolism. 3. Advanced emphysema. Aortic Atherosclerosis (ICD10-I70.0) and Emphysema (ICD10-J43.9). Electronically Signed   By: Marnee SpringJonathon  Watts M.D.   On: 01/21/2020 05:55   CT Hip Right Wo Contrast  Result Date: 01/21/2020 CLINICAL DATA:  Hip trauma.  Fracture suspected. EXAM: CT OF THE RIGHT HIP WITHOUT CONTRAST TECHNIQUE: Multidetector CT imaging of the right hip was performed according to the standard protocol. Multiplanar CT image reconstructions were also generated. COMPARISON:  Hip x-rays from earlier same day. FINDINGS: Bones/Joint/Cartilage Linear lucency identified in the lateral cortex of the proximal right femoral diaphysis associated with focal cortical thickening. Imaging features compatible with nondisplaced stress fracture. The abnormality is included on the extreme inferior aspect of this study and is best visualized on sagittal reconstruction image 21 of series 5. Degenerative changes are noted in the right hip. Ligaments Suboptimally assessed by CT. Muscles and Tendons No substantial hemorrhage noted in the proximal thigh musculature. Soft tissues Distended urinary bladder is incompletely visualized. IMPRESSION: Linear lucency in the lateral cortex of the proximal right femoral diaphysis associated with focal cortical thickening. Imaging features compatible with nondisplaced stress fracture. Consider MRI of the proximal right femur for definitive characterization. Electronically Signed   By: Kennith CenterEric  Mansell M.D.   On: 01/21/2020 06:02   DG Chest Portable 1 View  Result Date: 01/21/2020 CLINICAL DATA:  Larey SeatFell, short of breath, hypoxia EXAM: PORTABLE CHEST 1 VIEW COMPARISON:  06/07/2019 FINDINGS: Single frontal view of the chest demonstrates a stable cardiac silhouette. Extensive bullous  emphysematous changes are seen, greatest at the right apex. Marked areas of scarring and fibrosis are seen throughout the lungs. Increased density at the right costophrenic angle may reflect consolidation and/or effusion. Chronic posttraumatic changes of the thoracic cage. No acute displaced fractures. IMPRESSION: 1. 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 2. Density at the right lateral lung base may  reflect focal lung consolidation and/or small effusion. Electronically Signed   By: Sharlet Salina M.D.   On: 01/21/2020 03:48   DG Hip Unilat W or Wo Pelvis 2-3 Views Right  Result Date: 01/21/2020 CLINICAL DATA:  Larey Seat, pain EXAM: DG HIP (WITH OR WITHOUT PELVIS) 2-3V RIGHT COMPARISON:  01/31/2017 FINDINGS: Frontal view of the pelvis as well as frontal and cross-table lateral views of the right hip are obtained. There is a nondisplaced transverse fracture through the proximal femoral diaphysis. Alignment is anatomic. There are no other acute displaced fractures. The hips are well aligned. Mild bilateral hip osteoarthritis. Sacroiliac joints are normal. IMPRESSION: 1. Nondisplaced transverse fracture through the proximal right femoral diaphysis. Fracture line involves an area of chronic focal cortical thickening, and likely reflects an acute fracture at a site of chronic stress. Electronically Signed   By: Sharlet Salina M.D.   On: 01/21/2020 03:46    SIGNED: Kendell Bane, MD, FACP, FHM. [facility],  Pager (please use [email or web address] to page/text)  If 7PM-7AM, please contact night-coverage [email or web address], Password [facility] 01/21/2020, 1:00 PM

## 2020-01-21 NOTE — Consult Note (Signed)
ORTHOPAEDIC CONSULTATION  REQUESTING PHYSICIAN: Kendell Bane, MD  Chief Complaint: right hip pain  HPI: Alice Wallace is a 79 y.o. female who complains of right hip pain. Please see H&P and ED notes for details. Denies any numbness, tingling or constitutional symptoms.  Past Medical History:  Diagnosis Date  . COPD (chronic obstructive pulmonary disease) (HCC)   . Deaf   . Hypertension   . Osteoporosis    History reviewed. No pertinent surgical history. Social History   Socioeconomic History  . Marital status: Single    Spouse name: Not on file  . Number of children: Not on file  . Years of education: Not on file  . Highest education level: Not on file  Occupational History  . Not on file  Tobacco Use  . Smoking status: Never Smoker  . Smokeless tobacco: Never Used  Substance and Sexual Activity  . Alcohol use: Not Currently  . Drug use: Not Currently  . Sexual activity: Not on file  Other Topics Concern  . Not on file  Social History Narrative  . Not on file   Social Determinants of Health   Financial Resource Strain:   . Difficulty of Paying Living Expenses: Not on file  Food Insecurity:   . Worried About Programme researcher, broadcasting/film/video in the Last Year: Not on file  . Ran Out of Food in the Last Year: Not on file  Transportation Needs:   . Lack of Transportation (Medical): Not on file  . Lack of Transportation (Non-Medical): Not on file  Physical Activity:   . Days of Exercise per Week: Not on file  . Minutes of Exercise per Session: Not on file  Stress:   . Feeling of Stress : Not on file  Social Connections:   . Frequency of Communication with Friends and Family: Not on file  . Frequency of Social Gatherings with Friends and Family: Not on file  . Attends Religious Services: Not on file  . Active Member of Clubs or Organizations: Not on file  . Attends Banker Meetings: Not on file  . Marital Status: Not on file   No family history on  file. Allergies  Allergen Reactions  . Hydrocodone     Other reaction(s): Unknown  . Naproxen     Other reaction(s): Unknown  . Other Other (See Comments)    nuts  . Sulfamethoxazole-Trimethoprim     Other reaction(s): Unknown  . Tramadol     Other reaction(s): Unknown Pt has tolerated this med 1/5-1/7 with no issues   Prior to Admission medications   Medication Sig Start Date End Date Taking? Authorizing Provider  acetaminophen (TYLENOL) 325 MG tablet Take 650 mg by mouth in the morning, at noon, and at bedtime.   Yes [provider]  acetaminophen (TYLENOL) 500 MG tablet Take 500 mg by mouth every 8 (eight) hours as needed for moderate pain or fever.   Yes [provider]  albuterol (PROVENTIL) (2.5 MG/3ML) 0.083% nebulizer solution Take 2.5 mg by nebulization every 6 (six) hours as needed for wheezing or shortness of breath.   Yes [provider]  albuterol (VENTOLIN HFA) 108 (90 Base) MCG/ACT inhaler Inhale 2 puffs into the lungs every 4 (four) hours as needed for wheezing or shortness of breath.   Yes [provider]  alendronate (FOSAMAX) 70 MG tablet Take 70 mg by mouth once a week. Take with a full glass of water on an empty stomach.   Yes [provider]  aluminum-magnesium hydroxide-simethicone (MAALOX) 200-200-20 MG/5ML SUSP Take 30 mLs by mouth 4 (four) times daily -  before meals and at bedtime.   Yes [provider]  aspirin EC 81 MG tablet Take 81 mg by mouth daily. Swallow whole.   Yes [provider]  Calcium Carbonate-Vitamin D3 (CALCIUM 600-D) 600-400 MG-UNIT TABS Take 1 tablet by mouth 2 (two) times daily.   Yes [provider]  cetirizine (ZYRTEC) 10 MG tablet Take 10 mg by mouth daily.   Yes [provider]  Cholecalciferol (VITAMIN D) 125 MCG (5000 UT) CAPS Take 5,000 Units by mouth daily.   Yes [provider]  diclofenac sodium (VOLTAREN) 1 % GEL Apply 2 g topically 3 (three)  times daily. 01/03/19  Yes [provider]  diltiazem (CARDIZEM CD) 240 MG 24 hr capsule Take 240 mg by mouth daily. 01/11/19  Yes [provider]  diphenhydrAMINE (BENADRYL) 25 MG tablet Take 25 mg by mouth every 6 (six) hours as needed for allergies.   Yes [provider]  escitalopram (LEXAPRO) 10 MG tablet Take 10 mg by mouth at bedtime. 06/08/16  Yes [provider]  feeding supplement, ENSURE ENLIVE, (ENSURE ENLIVE) LIQD Take 237 mLs by mouth 3 (three) times daily between meals. 06/10/19  Yes Enedina Finner, MD  ferrous sulfate 325 (65 FE) MG tablet Take 325 mg by mouth 2 (two) times daily with a meal.    Yes [provider]  Fluticasone-Salmeterol (ADVAIR) 100-50 MCG/DOSE AEPB Inhale 1 puff into the lungs 2 (two) times daily.   Yes [provider]  furosemide (LASIX) 20 MG tablet Take 20 mg by mouth daily.   Yes [provider]  guaiFENesin (MUCINEX) 600 MG 12 hr tablet Take 600 mg by mouth daily.   Yes [provider]  guaifenesin (ROBITUSSIN) 100 MG/5ML syrup Take 300 mg by mouth 4 (four) times daily as needed for cough.   Yes [provider]  loperamide (IMODIUM A-D) 2 MG tablet Take 2 mg by mouth daily as needed for diarrhea or loose stools.   Yes [provider]  magnesium hydroxide (MILK OF MAGNESIA) 400 MG/5ML suspension Take 30 mLs by mouth daily as needed for mild constipation.   Yes [provider]  metolazone (ZAROXOLYN) 2.5 MG tablet Take 2.5 mg by mouth 2 (two) times daily.   Yes [provider]  omeprazole (PRILOSEC) 20 MG capsule Take 20 mg by mouth daily.   Yes [provider]  ondansetron (ZOFRAN) 4 MG tablet Take 4 mg by mouth every 4 (four) hours as needed for nausea or vomiting.   Yes [provider]  potassium chloride SA (KLOR-CON) 20 MEQ tablet Take 40 mEq by mouth daily.   Yes [provider]  tiZANidine (ZANAFLEX) 4 MG tablet Take 4 mg by  mouth daily.   Yes [provider]  tolnaftate (ANTIFUNGAL) 1 % cream Apply 1 application topically as needed.   Yes [provider]  traMADol (ULTRAM) 50 MG tablet Take 50 mg by mouth every 8 (eight) hours.   Yes [provider]  azithromycin (ZITHROMAX) 250 MG tablet Daily for 9 days Patient not taking: Reported on 01/21/2020 06/11/19   Enedina Finner, MD  Multiple Vitamin (MULTIVITAMIN WITH MINERALS) TABS tablet Take 1 tablet by mouth daily. Patient not taking: Reported on 01/21/2020 06/11/19   Enedina Finner, MD  predniSONE (DELTASONE) 10 MG tablet Take 3 tablets (30 mg total) by mouth daily with breakfast. Take  30 mg day 1 then  -- 30mg  day 2 and so on  --20 mg  --20mg   --20mg   -10 mg  --10mg   --10mg  then stop Patient not taking: Reported on 01/21/2020 06/11/19   , MD   CT Angio Chest PE W/Cm &/Or Wo Cm  Result Date: 01/21/2020 CLINICAL DATA:  Fall and shortness of breath. EXAM: CT ANGIOGRAPHY CHEST WITH CONTRAST TECHNIQUE: Multidetector CT imaging of the chest was performed using the standard protocol during bolus administration of intravenous contrast. Multiplanar CT image reconstructions and MIPs were obtained to evaluate the vascular anatomy. CONTRAST:  3mL OMNIPAQUE IOHEXOL 350 MG/ML SOLN COMPARISON:  06/09/2019 FINDINGS: Cardiovascular: Prominent right ventricular size. No pericardial effusion. Aortic and great vessel atherosclerotic calcification. No pulmonary artery filling defect is seen. There is significant motion artifact at the lung bases. Mediastinum/Nodes: Small to moderate sliding hiatal hernia. No adenopathy. Lungs/Pleura: Opacity in the right lower lobe with volume loss. No edema, effusion, or pneumothorax. Panlobular emphysema. Dystrophic calcifications in the apical lungs. Bronchomalacia. Upper Abdomen: Atherosclerotic calcification. Musculoskeletal: Healing anterior and lateral left rib fractures. Review of the MIP images confirms the above  findings. IMPRESSION: 1. Right lower lobe atelectasis versus pneumonia. 2. Moderately motion degraded chest CTA. No evidence of pulmonary embolism. 3. Advanced emphysema. Aortic Atherosclerosis (ICD10-I70.0) and Emphysema (ICD10-J43.9). Electronically Signed   By: 06/13/19 M.D.   On: 01/21/2020 05:55   CT Hip Right Wo Contrast  Result Date: 01/21/2020 CLINICAL DATA:  Hip trauma.  Fracture suspected. EXAM: CT OF THE RIGHT HIP WITHOUT CONTRAST TECHNIQUE: Multidetector CT imaging of the right hip was performed according to the standard protocol. Multiplanar CT image reconstructions were also generated. COMPARISON:  Hip x-rays from earlier same day. FINDINGS: Bones/Joint/Cartilage Linear lucency identified in the lateral cortex of the proximal right femoral diaphysis associated with focal cortical thickening. Imaging features compatible with nondisplaced stress fracture. The abnormality is included on the extreme inferior aspect of this study and is best visualized on sagittal reconstruction image 21 of series 5. Degenerative changes are noted in the right hip. Ligaments Suboptimally assessed by CT. Muscles and Tendons No substantial hemorrhage noted in the proximal thigh musculature. Soft tissues Distended urinary bladder is incompletely visualized. IMPRESSION: Linear lucency in the lateral cortex of the proximal right femoral diaphysis associated with focal cortical thickening. Imaging features compatible with nondisplaced stress fracture. Consider MRI of the proximal right femur for definitive characterization. Electronically Signed   By: 72m M.D.   On: 01/21/2020 06:02   DG Chest Portable 1 View  Result Date: 01/21/2020 CLINICAL DATA:  03/22/2020, short of breath, hypoxia EXAM: PORTABLE CHEST 1 VIEW COMPARISON:  06/07/2019 FINDINGS: Single frontal view of the chest demonstrates a stable cardiac silhouette. Extensive bullous emphysematous changes are seen, greatest at the right apex. Marked areas of  scarring and fibrosis are seen throughout the lungs. Increased density at the right costophrenic angle may reflect consolidation and/or effusion. Chronic posttraumatic changes of the thoracic cage. No acute displaced fractures. IMPRESSION: 1. 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 2. Density at the right lateral lung base may reflect focal lung consolidation and/or small effusion. Electronically Signed   By: Kennith Center M.D.   On: 01/21/2020 03:48   DG Hip Unilat W or Wo Pelvis 2-3 Views Right  Result Date: 01/21/2020 CLINICAL DATA:  Larey Seat, pain EXAM: DG HIP (WITH OR WITHOUT PELVIS) 2-3V RIGHT COMPARISON:  01/31/2017 FINDINGS: Frontal view of the pelvis as well as frontal  and cross-table lateral views of the right hip are obtained. There is a nondisplaced transverse fracture through the proximal femoral diaphysis. Alignment is anatomic. There are no other acute displaced fractures. The hips are well aligned. Mild bilateral hip osteoarthritis. Sacroiliac joints are normal. IMPRESSION: 1. Nondisplaced transverse fracture through the proximal right femoral diaphysis. Fracture line involves an area of chronic focal cortical thickening, and likely reflects an acute fracture at a site of chronic stress. Electronically Signed   By: Sharlet SalinaMichael  Brown M.D.   On: 01/21/2020 03:46    Positive ROS: All other systems have been reviewed and were otherwise negative with the exception of those mentioned in the HPI and as above.  Physical Exam: General: Alert, no acute distress Cardiovascular: No pedal edema Respiratory: No cyanosis, no use of accessory musculature GI: No organomegaly, abdomen is soft and non-tender Skin: No lesions in the area of chief complaint Neurologic: Sensation intact distally Psychiatric: Patient is competent for consent with normal mood and affect Lymphatic: No axillary or cervical lymphadenopathy  MUSCULOSKELETAL: pain with IR/ER of the hip. Compartments  soft. Good cap refill. Motor and sensory intact distally.  Assessment: Non displaced lateral cortex fracture  Plan: The fracture pattern is suspicious for a bisphosphonate related side effect. Recommend discontinuing the Alendronate. It is currently an incomplete fracture. Recommend Non-weight bearing on the right and close clinical follow-up. She may ultimately require prophylactic femoral nailing, however I would not recommend currently. OK for physical therapy and NWB.    Lyndle HerrlichJames R Emylie Amster, MD    01/21/2020 10:37 AM

## 2020-01-21 NOTE — ED Notes (Addendum)
Pt no longer wheezing, wob improved. Pt switched from 15L nonrebreather to 6L [place] per MD York Cerise. Pt currently remaining 94% and above.

## 2020-01-21 NOTE — Progress Notes (Signed)
ANTICOAGULATION CONSULT NOTE   Pharmacy Consult for Heparin Indication: chest pain/ACS  Allergies  Allergen Reactions  . Hydrocodone     Other reaction(s): Unknown  . Naproxen     Other reaction(s): Unknown  . Other Other (See Comments)    nuts  . Sulfamethoxazole-Trimethoprim     Other reaction(s): Unknown  . Tramadol     Other reaction(s): Unknown Pt has tolerated this med 1/5-1/7 with no issues    Patient Measurements: Height: 5\' 6"  (167.6 cm) Weight: 55.9 kg (123 lb 3.8 oz) IBW/kg (Calculated) : 59.3 HEPARIN DW (KG): 55.9  Vital Signs: BP: 121/64 (09/01 1430) Pulse Rate: 77 (09/01 1430)  Labs: Recent Labs    01/21/20 0253 01/21/20 0540 01/21/20 0541 01/21/20 1446  HGB 9.8*  --   --   --   HCT 29.4*  --   --   --   PLT 278  --   --   --   APTT  --  29  --   --   LABPROT 13.5  --   --   --   INR 1.1  --   --   --   HEPARINUNFRC  --   --   --  0.12*  CREATININE 1.06*  --   --   --   TROPONINIHS 245*  --  1,281*  --     Estimated Creatinine Clearance: 38 mL/min (A) (by C-G formula based on SCr of 1.06 mg/dL (H)).   Medical History: Past Medical History:  Diagnosis Date  . COPD (chronic obstructive pulmonary disease) (HCC)   . Deaf   . Hypertension   . Osteoporosis     Medications:  (Not in a hospital admission)   Assessment: Heparin for ACS.  No anticoagulants PTA.  Baseline labs ordered.  09/01 HL @ 1517 0.12. Subtherapeutic. Confirmed no heparin infusion interruptions.  Goal of Therapy:  Heparin level 0.3-0.7 units/ml Monitor platelets by anticoagulation protocol: Yes   Plan:  Heparin 1650 units bolus then infusion at 800 units/hr. Informed nursing of plan.  Check HL ~ 6 hours after heparin started  11/01 01/21/2020,3:29 PM

## 2020-01-21 NOTE — ED Notes (Signed)
Called dietary for tray, pt did not receive clear liquid tray.

## 2020-01-21 NOTE — ED Notes (Signed)
Pt has dinner tray at bedside. 

## 2020-01-21 NOTE — ED Notes (Signed)
Pt O2 sats noted at 83% on 6Lnc due to sleeping with mouth open. Pt placed on 15L nonrebreather and improved to 94%

## 2020-01-22 DIAGNOSIS — J9601 Acute respiratory failure with hypoxia: Secondary | ICD-10-CM

## 2020-01-22 LAB — COMPREHENSIVE METABOLIC PANEL
ALT: 30 U/L (ref 0–44)
AST: 37 U/L (ref 15–41)
Albumin: 3.1 g/dL — ABNORMAL LOW (ref 3.5–5.0)
Alkaline Phosphatase: 53 U/L (ref 38–126)
Anion gap: 10 (ref 5–15)
BUN: 17 mg/dL (ref 8–23)
CO2: 24 mmol/L (ref 22–32)
Calcium: 8.5 mg/dL — ABNORMAL LOW (ref 8.9–10.3)
Chloride: 102 mmol/L (ref 98–111)
Creatinine, Ser: 0.58 mg/dL (ref 0.44–1.00)
GFR calc Af Amer: >60 mL/min (ref >60)
GFR calc non Af Amer: >60 mL/min (ref >60)
Glucose, Bld: 124 mg/dL — ABNORMAL HIGH (ref 70–99)
Potassium: 4.2 mmol/L (ref 3.5–5.1)
Sodium: 136 mmol/L (ref 135–145)
Total Bilirubin: 0.6 mg/dL (ref 0.3–1.2)
Total Protein: 5.4 g/dL — ABNORMAL LOW (ref 6.5–8.1)

## 2020-01-22 LAB — BASIC METABOLIC PANEL
Anion gap: 11 (ref 5–15)
BUN: 19 mg/dL (ref 8–23)
CO2: 25 mmol/L (ref 22–32)
Calcium: 8.3 mg/dL — ABNORMAL LOW (ref 8.9–10.3)
Chloride: 101 mmol/L (ref 98–111)
Creatinine, Ser: 0.76 mg/dL (ref 0.44–1.00)
GFR calc Af Amer: >60 mL/min (ref >60)
GFR calc non Af Amer: >60 mL/min (ref >60)
Glucose, Bld: 132 mg/dL — ABNORMAL HIGH (ref 70–99)
Potassium: 3.7 mmol/L (ref 3.5–5.1)
Sodium: 137 mmol/L (ref 135–145)

## 2020-01-22 LAB — TROPONIN I (HIGH SENSITIVITY): Troponin I (High Sensitivity): 2276 ng/L (ref <18)

## 2020-01-22 LAB — HEPARIN LEVEL (UNFRACTIONATED): Heparin Unfractionated: 0.35 IU/mL (ref 0.30–0.70)

## 2020-01-22 LAB — CBC
HCT: 24.1 % — ABNORMAL LOW (ref 36.0–46.0)
Hemoglobin: 8 g/dL — ABNORMAL LOW (ref 12.0–15.0)
MCH: 30.4 pg (ref 26.0–34.0)
MCHC: 33.2 g/dL (ref 30.0–36.0)
MCV: 91.6 fL (ref 80.0–100.0)
Platelets: 246 10*3/uL (ref 150–400)
RBC: 2.63 MIL/uL — ABNORMAL LOW (ref 3.87–5.11)
RDW: 14.2 % (ref 11.5–15.5)
WBC: 12 10*3/uL — ABNORMAL HIGH (ref 4.0–10.5)

## 2020-01-22 LAB — MRSA PCR SCREENING: MRSA by PCR: POSITIVE — AB

## 2020-01-22 MED ORDER — ASPIRIN EC 81 MG PO TBEC
81.0000 mg | DELAYED_RELEASE_TABLET | Freq: Every day | ORAL | Status: DC
  Administered 2020-01-23 – 2020-02-03 (×12): 81 mg via ORAL
  Filled 2020-01-22 (×12): qty 1

## 2020-01-22 MED ORDER — MUPIROCIN 2 % EX OINT
1.0000 "application " | TOPICAL_OINTMENT | Freq: Two times a day (BID) | CUTANEOUS | Status: AC
  Administered 2020-01-23 – 2020-01-27 (×9): 1 via NASAL
  Filled 2020-01-22 (×3): qty 22

## 2020-01-22 MED ORDER — FUROSEMIDE 20 MG PO TABS
20.0000 mg | ORAL_TABLET | Freq: Every day | ORAL | Status: DC
  Administered 2020-01-23 – 2020-01-24 (×2): 20 mg via ORAL
  Filled 2020-01-22 (×3): qty 1

## 2020-01-22 MED ORDER — CLOPIDOGREL BISULFATE 75 MG PO TABS
75.0000 mg | ORAL_TABLET | Freq: Every day | ORAL | Status: DC
  Administered 2020-01-22 – 2020-01-25 (×4): 75 mg via ORAL
  Filled 2020-01-22 (×6): qty 1

## 2020-01-22 MED ORDER — CHLORHEXIDINE GLUCONATE CLOTH 2 % EX PADS
6.0000 | MEDICATED_PAD | Freq: Every day | CUTANEOUS | Status: AC
  Administered 2020-01-23 – 2020-01-25 (×3): 6 via TOPICAL

## 2020-01-22 NOTE — Progress Notes (Signed)
PROGRESS NOTE    Patient: Alice Wallace                            PCP: Devoria Glassing, NP                    DOB: 09/29/1940            DOA: 01/21/2020 HQI:[id number]             DOS: 01/22/2020, 1:54 PM   LOS: 1 day   Date of Service: The patient was seen and examined on 01/22/2020  Subjective:   The patient was seen and examined this morning, reporting improved shortness of breath, denies any chest pain. Still tachycardic heart rate 102, mildly tachypneic respiratory rate of 19, blood pressure stable at 134/62, satting 96% on 6 L of oxygen by nasal cannula  Brief Narrative:   Alice Wallace  is a 79 y.o. Caucasian female with a known history of COPD, hypertension, deafness and muteness, presented to the emergency room with acute onset of fall at her assisted living facility with subsequent right hip pain and significant respiratory distress.  Her pulse currently was down to the 70s on her 2 L O2 by nasal cannula with associated coarse breath sounds and wheezing.  She was subsequently increased to 6 L, then 15 L via nonrebreather mask, to maintain O2 sat greater 92%.   She was thought to be in COPD exacerbation was given IV Solu-Medrol and nebulized albuterol as well as duo nebs by EMS on route to the hospital. She can read lips and written communications.  ED  RR 23, HR 106, 93-96% on 15 L of O2 via NRM   BNP of 191.6, high-sensitivity troponin I of 245 mL 1281 and CBC showed leukocytosis of 10.8 as well as anemia.  COVID-19 PCR came back negative.  EKG showed mild ectopic atrial tachycardia with  incomplete RBB, LVH with T wave inversion laterally.  Treated with aspirin, IV heparin, nebs, IV Versed, morphine Chest CTA that ruled out PE and showed right lower lobe atelectasis CT scan since suspicious for nondisplaced stress fracture of the proximal right femoral diaphysis with consideration for MRI.  [name] was notified about the patient.     Assessment & Plan:   Active  Problems:   Non-STEMI (non-ST elevated myocardial infarction) (HCC)   Acute respiratory failure with hypoxia (HCC)   COPD exacerbation (HCC)   Acute on chronic respiratory failure with COPD exacerbation /bronchitis, cannot rule out pneumonia -Patient respite distress has continued to improve, patient has been weaned off 15 L of oxygen to currently 6 L of oxygen, satting 96% -Goal is to taper the patient off oxygen or to maintain O2 greater 92% -Continue DuoNeb bronchodilators scheduled and as needed -We will continue IV steroids will not taper today  -We will continue with antibiotics IV Rocephin, azithromycin -We will hold off Advair Diskus.  -Status post CT angiogram chest -negative for pulmonary embolism  CT angiogram IMPRESSION: 1. Right lower lobe atelectasis versus pneumonia. 2. Moderately motion degraded chest CTA. No evidence of pulmonary embolism. 3. Advanced emphysema. Aortic Atherosclerosis   ?  History of CHF diastolic failure.. Echo reviewed no signs of overt diastolic failure, ejection fraction preserved 60-65% -Monitoring daily weight -For no clear reason patient is on Zaroxolyn and Lasix at home will be resumed -Appreciate cardiology input   Suspected non-STEMI. -Denies any chest pain, shortness of breath improved -Elevated  troponin ... Trending up ...  -Nonspecific EKG changes -The patient will be continued on IV heparin. -On aspirin, statins, as needed sublingual nitroglycerin and morphine -2D echo reviewed ejection fraction 60-65% negative for any regional or global abnormality -Cardiology following closely ... Not recommending intervention at this time, medical management Dr. [name] following  -Appreciate cardiology input regarding heparin drip at this time  Hypertension. -Monitor very closely, continue home medication Cardizem CD -Patient is currently receiving as needed nitroglycerin -Stable   Proximal right femoral diaphysis fracture -Ortho  consulted -stating that fracture is suspicious of bisphosphonate related side effect recommending discontinuing alendronate Ortho recommending nonweightbearing on right hip, close follow-up in clinic Stating will likely need prophylactic femoral nailing -Ortho recommending no intervention -PT/OT once patient stable  Depression -We will continue home medication of Lexapro,  Vitamin D deficiency Continue supplements   GERD Continue Protonix  Hypokalemia Repleting accordingly, monitoring   Nutritional status:          Consultants:  Cardiology/orthopedic   --------------------------------------------------------------------------------------------------------------------------------------------- DVT prophylaxis:  Heparin drip Code Status:   Code Status: Full Code Family Communication: No family member present at bedside- attempt will be made to update daily The above findings and plan of care has been discussed with patient (and family )  in detail,  they expressed understanding and agreement of above. -Advance care planning has been discussed.   Admission status:    Status is: Inpatient  Remains inpatient appropriate because:Inpatient level of care appropriate due to severity of illness   Dispo: The patient is from: Home              Anticipated d/c is to: [facility]               Anticipated d/c date is: > 3 days              Patient currently is not medically stable to d/c.        Procedures:   No admission procedures for hospital encounter.   Antimicrobials:  Anti-infectives (From admission, onward)   Start     Dose/Rate Route Frequency Ordered Stop   01/21/20 1530  ceFAZolin (ANCEF) IVPB 2g/100 mL premix  Status:  Discontinued        2 g 200 mL/hr over 30 Minutes Intravenous  Once 01/21/20 0738 01/21/20 1009   01/21/20 0730  azithromycin (ZITHROMAX) 500 mg in sodium chloride 0.9 % 250 mL IVPB        500 mg 250 mL/hr over 60 Minutes  Intravenous Every 24 hours 01/21/20 0615     01/21/20 0700  cefTRIAXone (ROCEPHIN) 1 g in sodium chloride 0.9 % 100 mL IVPB        1 g 200 mL/hr over 30 Minutes Intravenous Every 24 hours 01/21/20 0615         Medication:  . alum & mag hydroxide-simeth  30 mL Oral TID AC & HS  . atorvastatin  80 mg Oral Daily  . calcium-vitamin D  1 tablet Oral BID  . cholecalciferol  5,000 Units Oral Daily  . diltiazem  240 mg Oral Daily  . escitalopram  10 mg Oral Daily  . feeding supplement (ENSURE ENLIVE)  237 mL Oral TID BM  . ferrous sulfate  325 mg Oral BID WC  . [START ON 01/23/2020] furosemide  20 mg Oral Daily  . guaiFENesin  600 mg Oral Daily  . ipratropium-albuterol  3 mL Nebulization QID  . loratadine  10 mg Oral Daily  .  methylPREDNISolone (SOLU-MEDROL) injection  40 mg Intravenous Q8H  . metolazone  2.5 mg Oral BID  . pantoprazole  40 mg Oral Daily  . potassium chloride SA  40 mEq Oral Daily  . tiZANidine  4 mg Oral Daily    acetaminophen **OR** acetaminophen, albuterol, diphenhydrAMINE, loperamide, magnesium hydroxide, morphine injection, morphine injection, nitroGLYCERIN, ondansetron **OR** ondansetron (ZOFRAN) IV, traZODone   Objective:   Vitals:   01/22/20 0547 01/22/20 0848 01/22/20 0909 01/22/20 1056  BP: (!) 152/65  139/64 134/62  Pulse: (!) 105  (!) 101 (!) 102  Resp: 17  18 19   Temp: 98.2 F (36.8 C)  97.6 F (36.4 C) 97.9 F (36.6 C)  TempSrc: Oral     SpO2: 92% 94% 94% 96%  Weight: 56.2 kg     Height:        Intake/Output Summary (Last 24 hours) at 01/22/2020 1354 Last data filed at 01/22/2020 1055 Gross per 24 hour  Intake 360 ml  Output 825 ml  Net -465 ml   Filed Weights   01/21/20 0259 01/22/20 0547  Weight: 55.9 kg 56.2 kg     Examination:      Physical Exam:   General:  Alert, oriented, cooperative, no distress; improved shortness of breath  HEENT:  Normocephalic, PERRL, otherwise with in Normal limits   Neuro:  CNII-XII intact. , normal  motor and sensation, reflexes intact   Lungs:   Clear to auscultation BL, Respirations unlabored, no wheezes / crackles  Cardio:    S1/S2, RRR, No murmure, No Rubs or Gallops   Abdomen:   Soft, non-tender, bowel sounds active all four quadrants,  no guarding or peritoneal signs.  Muscular skeletal:   Generalized weaknesses Limited exam - in bed, able to move all 4 extremities, Normal strength,  2+ pulses,  symmetric, +1 pitting edema  Skin:  Dry, warm to touch, negative for any Rashes,  Wounds: Please see nursing documentation Pressure Injury 05/27/19 Ischial tuberosity Left Unstageable - Full thickness tissue loss in which the base of the injury is covered by slough (yellow, tan, gray, green or brown) and/or eschar (tan, brown or black) in the wound bed. (Active)  05/27/19 1630  Location: Ischial tuberosity  Location Orientation: Left  Staging: Unstageable - Full thickness tissue loss in which the base of the injury is covered by slough (yellow, tan, gray, green or brown) and/or eschar (tan, brown or black) in the wound bed.  Wound Description (Comments):   Present on Admission: Yes          ------------------------------------------------------------------------------------------------------------------------------------------    LABs:  CBC Latest Ref Rng & Units 01/21/2020 06/08/2019 06/07/2019  WBC 4.0 - 10.5 K/uL 10.8(H) 11.5(H) 16.3(H)  Hemoglobin 12.0 - 15.0 g/dL 1.6(X9.8(L) 10.5(L) 11.2(L)  Hematocrit 36 - 46 % 29.4(L) 32.7(L) 34.0(L)  Platelets 150 - 400 K/uL 278 343 392   CMP Latest Ref Rng & Units 01/22/2020 01/21/2020 06/08/2019  Glucose 70 - 99 mg/dL 096(E132(H) 454(U112(H) 981(X140(H)  BUN 8 - 23 mg/dL 19 91(Y31(H) 78(G34(H)  Creatinine 0.44 - 1.00 mg/dL 9.560.76 2.13(Y1.06(H) 8.650.81  Sodium 135 - 145 mmol/L 137 133(L) 137  Potassium 3.5 - 5.1 mmol/L 3.7 4.2 3.7  Chloride 98 - 111 mmol/L 101 96(L) 100  CO2 22 - 32 mmol/L 25 23 28   Calcium 8.9 - 10.3 mg/dL 8.3(L) 8.9 8.8(L)  Total Protein 6.5 - 8.1 g/dL - 7.2  6.9  Total Bilirubin 0.3 - 1.2 mg/dL - 1.2 0.7  Alkaline Phos 38 - 126 U/L -  59 49  AST 15 - 41 U/L - 25 18  ALT 0 - 44 U/L - 16 15       Micro Results Recent Results (from the past 240 hour(s))  SARS Coronavirus 2 by RT PCR (hospital order, performed in [facility] hospital lab) Nasopharyngeal Nasopharyngeal Swab     Status: None   Collection Time: 01/21/20  2:53 AM   Specimen: Nasopharyngeal Swab  Result Value Ref Range Status   SARS Coronavirus 2 NEGATIVE NEGATIVE Final    Comment: (NOTE) SARS-CoV-2 target nucleic acids are NOT DETECTED.  The SARS-CoV-2 RNA is generally detectable in upper and lower respiratory specimens during the acute phase of infection. The lowest concentration of SARS-CoV-2 viral copies this assay can detect is 250 copies / mL. A negative result does not preclude SARS-CoV-2 infection and should not be used as the sole basis for treatment or other patient management decisions.  A negative result may occur with improper specimen collection / handling, submission of specimen other than nasopharyngeal swab, presence of viral mutation(s) within the areas targeted by this assay, and inadequate number of viral copies (<250 copies / mL). A negative result must be combined with clinical observations, patient history, and epidemiological information.  Fact Sheet for Patients:   BoilerBrush.com.cy  Fact Sheet for Healthcare Providers: https://pope.com/  This test is not yet approved or  cleared by the [place] FDA and has been authorized for detection and/or diagnosis of SARS-CoV-2 by FDA under an Emergency Use Authorization (EUA).  This EUA will remain in effect (meaning this test can be used) for the duration of the COVID-19 declaration under Section 564(b)(1) of the Act, 21 U.S.C. section 360bbb-3(b)(1), unless the authorization is terminated or revoked sooner.  Performed at [facility], [address] [place], [place] [address]     Radiology Reports CT Angio Chest PE W/Cm &/Or Wo Cm  Result Date: 01/21/2020 CLINICAL DATA:  Fall and shortness of breath. EXAM: CT ANGIOGRAPHY CHEST WITH CONTRAST TECHNIQUE: Multidetector CT imaging of the chest was performed using the standard protocol during bolus administration of intravenous contrast. Multiplanar CT image reconstructions and MIPs were obtained to evaluate the vascular anatomy. CONTRAST:  73mL OMNIPAQUE IOHEXOL 350 MG/ML SOLN COMPARISON:  06/09/2019 FINDINGS: Cardiovascular: Prominent right ventricular size. No pericardial effusion. Aortic and great vessel atherosclerotic calcification. No pulmonary artery filling defect is seen. There is significant motion artifact at the lung bases. Mediastinum/Nodes: Small to moderate sliding hiatal hernia. No adenopathy. Lungs/Pleura: Opacity in the right lower lobe with volume loss. No edema, effusion, or pneumothorax. Panlobular emphysema. Dystrophic calcifications in the apical lungs. Bronchomalacia. Upper Abdomen: Atherosclerotic calcification. Musculoskeletal: Healing anterior and lateral left rib fractures. Review of the MIP images confirms the above findings. IMPRESSION: 1. Right lower lobe atelectasis versus pneumonia. 2. Moderately motion degraded chest CTA. No evidence of pulmonary embolism. 3. Advanced emphysema. Aortic Atherosclerosis (ICD10-I70.0) and Emphysema (ICD10-J43.9). Electronically Signed   By: Marnee Spring M.D.   On: 01/21/2020 05:55   CT Hip Right Wo Contrast  Result Date: 01/21/2020 CLINICAL DATA:  Hip trauma.  Fracture suspected. EXAM: CT OF THE RIGHT HIP WITHOUT CONTRAST TECHNIQUE: Multidetector CT imaging of the right hip was performed according to the standard protocol. Multiplanar CT image reconstructions were also generated. COMPARISON:  Hip x-rays from earlier same day. FINDINGS: Bones/Joint/Cartilage Linear lucency identified in the lateral cortex of the proximal right  femoral diaphysis associated with focal cortical thickening. Imaging features compatible with nondisplaced stress fracture. The abnormality  is included on the extreme inferior aspect of this study and is best visualized on sagittal reconstruction image 21 of series 5. Degenerative changes are noted in the right hip. Ligaments Suboptimally assessed by CT. Muscles and Tendons No substantial hemorrhage noted in the proximal thigh musculature. Soft tissues Distended urinary bladder is incompletely visualized. IMPRESSION: Linear lucency in the lateral cortex of the proximal right femoral diaphysis associated with focal cortical thickening. Imaging features compatible with nondisplaced stress fracture. Consider MRI of the proximal right femur for definitive characterization. Electronically Signed   By: Kennith Center M.D.   On: 01/21/2020 06:02   DG Chest Portable 1 View  Result Date: 01/21/2020 CLINICAL DATA:  Larey Seat, short of breath, hypoxia EXAM: PORTABLE CHEST 1 VIEW COMPARISON:  06/07/2019 FINDINGS: Single frontal view of the chest demonstrates a stable cardiac silhouette. Extensive bullous emphysematous changes are seen, greatest at the right apex. Marked areas of scarring and fibrosis are seen throughout the lungs. Increased density at the right costophrenic angle may reflect consolidation and/or effusion. Chronic posttraumatic changes of the thoracic cage. No acute displaced fractures. IMPRESSION: 1. 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 2. Density at the right lateral lung base may reflect focal lung consolidation and/or small effusion. Electronically Signed   By: Sharlet Salina M.D.   On: 01/21/2020 03:48   ECHOCARDIOGRAM COMPLETE  Result Date: 01/21/2020    ECHOCARDIOGRAM REPORT   Patient Name:   [facility] Date of Exam: 01/21/2020 Medical Rec #:  [id number]     Height:       66.0 in Accession #:    [id number]    Weight:       123.2 lb Date of Birth:  [birth year]      BSA:           1.628 m Patient Age:    79 years      BP:           116/63 mmHg Patient Gender: F             HR:           74 bpm. Exam Location:  [facility] Procedure: 2D Echo, Cardiac Doppler and Color Doppler Indications:     NSTEMI 121.4  History:         Patient has no prior history of Echocardiogram examinations.                  COPD; Risk Factors:Hypertension.  Sonographer:     Cristela Blue RDCS (AE) Referring Phys:  [id number] Vernetta Honey MANSY Diagnosing Phys: Debbe Odea MD IMPRESSIONS  1. Left ventricular ejection fraction, by estimation, is 60 to 65%. The left ventricle has normal function. The left ventricle has no regional wall motion abnormalities. Left ventricular diastolic parameters are consistent with Grade I diastolic dysfunction (impaired relaxation).  2. Right ventricular systolic function is normal. The right ventricular size is normal.  3. Left atrial size was moderately dilated.  4. The mitral valve is normal in structure. No evidence of mitral valve regurgitation. No evidence of mitral stenosis.  5. The aortic valve is normal in structure. Aortic valve regurgitation is not visualized. No aortic stenosis is present.  6. The inferior vena cava is normal in size with greater than 50% respiratory variability, suggesting right atrial pressure of 3 mmHg. FINDINGS  Left Ventricle: Left ventricular ejection fraction, by estimation, is 60 to 65%. The left ventricle has normal function. The left ventricle has no regional wall motion  abnormalities. The left ventricular internal cavity size was normal in size. There is  no left ventricular hypertrophy. Left ventricular diastolic parameters are consistent with Grade I diastolic dysfunction (impaired relaxation). Right Ventricle: The right ventricular size is normal. No increase in right ventricular wall thickness. Right ventricular systolic function is normal. Left Atrium: Left atrial size was moderately dilated. Right Atrium: Right atrial size was normal in size.  Pericardium: There is no evidence of pericardial effusion. Mitral Valve: The mitral valve is normal in structure. Normal mobility of the mitral valve leaflets. No evidence of mitral valve regurgitation. No evidence of mitral valve stenosis. Tricuspid Valve: The tricuspid valve is normal in structure. Tricuspid valve regurgitation is not demonstrated. No evidence of tricuspid stenosis. Aortic Valve: The aortic valve is normal in structure. Aortic valve regurgitation is not visualized. No aortic stenosis is present. Aortic valve mean gradient measures 3.5 mmHg. Aortic valve peak gradient measures 6.5 mmHg. Aortic valve area, by VTI measures 2.46 cm. Pulmonic Valve: The pulmonic valve was not well visualized. Pulmonic valve regurgitation is not visualized. No evidence of pulmonic stenosis. Aorta: The aortic root is normal in size and structure. Venous: The inferior vena cava was not well visualized. The inferior vena cava is normal in size with greater than 50% respiratory variability, suggesting right atrial pressure of 3 mmHg. IAS/Shunts: No atrial level shunt detected by color flow Doppler.  LEFT VENTRICLE PLAX 2D LVIDd:         4.77 cm  Diastology LVIDs:         3.08 cm  LV e' lateral:   7.83 cm/s LV PW:         1.15 cm  LV E/e' lateral: 11.6 LV IVS:        1.11 cm  LV e' medial:    5.87 cm/s LVOT diam:     2.00 cm  LV E/e' medial:  15.5 LV SV:         68 LV SV Index:   42 LVOT Area:     3.14 cm  RIGHT VENTRICLE RV Basal diam:  3.63 cm RV S prime:     12.40 cm/s TAPSE (M-mode): 3.6 cm LEFT ATRIUM             Index       RIGHT ATRIUM           Index LA diam:        4.80 cm 2.95 cm/m  RA Area:     15.10 cm LA Vol (A2C):   83.7 ml 51.42 ml/m RA Volume:   35.80 ml  21.99 ml/m LA Vol (A4C):   96.5 ml 59.28 ml/m LA Biplane Vol: 91.0 ml 55.90 ml/m  AORTIC VALVE                   PULMONIC VALVE AV Area (Vmax):    2.25 cm    PV Vmax:        0.92 m/s AV Area (Vmean):   2.51 cm    PV Peak grad:   3.4 mmHg AV Area  (VTI):     2.46 cm    RVOT Peak grad: 5 mmHg AV Vmax:           127.00 cm/s AV Vmean:          86.450 cm/s AV VTI:            0.277 m AV Peak Grad:      6.5 mmHg AV Mean Grad:  3.5 mmHg LVOT Vmax:         90.90 cm/s LVOT Vmean:        69.000 cm/s LVOT VTI:          0.217 m LVOT/AV VTI ratio: 0.78  AORTA Ao Root diam: 3.50 cm MITRAL VALVE                TRICUSPID VALVE MV Area (PHT): 3.34 cm     TR Peak grad:   30.7 mmHg MV Decel Time: 227 msec     TR Vmax:        277.00 cm/s MV E velocity: 91.20 cm/s MV A velocity: 137.00 cm/s  SHUNTS MV E/A ratio:  0.67         Systemic VTI:  0.22 m                             Systemic Diam: 2.00 cm Debbe Odea MD Electronically signed by Debbe Odea MD Signature Date/Time: 01/21/2020/4:26:00 PM    Final    DG Hip Unilat W or Wo Pelvis 2-3 Views Right  Result Date: 01/21/2020 CLINICAL DATA:  Larey Seat, pain EXAM: DG HIP (WITH OR WITHOUT PELVIS) 2-3V RIGHT COMPARISON:  01/31/2017 FINDINGS: Frontal view of the pelvis as well as frontal and cross-table lateral views of the right hip are obtained. There is a nondisplaced transverse fracture through the proximal femoral diaphysis. Alignment is anatomic. There are no other acute displaced fractures. The hips are well aligned. Mild bilateral hip osteoarthritis. Sacroiliac joints are normal. IMPRESSION: 1. Nondisplaced transverse fracture through the proximal right femoral diaphysis. Fracture line involves an area of chronic focal cortical thickening, and likely reflects an acute fracture at a site of chronic stress. Electronically Signed   By: Sharlet Salina M.D.   On: 01/21/2020 03:46    SIGNED: Kendell Bane, MD, FACP, FHM. [facility],  Pager (please use [email or web address] to page/text)  If 7PM-7AM, please contact night-coverage [email or web address] 01/22/2020, 1:54 PM

## 2020-01-22 NOTE — Progress Notes (Signed)
Attempted to call report to attending/charge nurse.

## 2020-01-22 NOTE — Progress Notes (Signed)
Unable to get admission data base as pt can't answer any questions, even written on paper.

## 2020-01-22 NOTE — Progress Notes (Signed)
Patient MRSA pcr returned positive. Protocol initiated.

## 2020-01-22 NOTE — Plan of Care (Signed)

## 2020-01-22 NOTE — Progress Notes (Addendum)
[facility] Cardiology    SUBJECTIVE: Ms. Alice Wallace is a 79 year old female with a past medical history significant for oxygen dependent COPD, hypertension, and hearing impairment who presented to the ED on 01/21/20 following a fall with subsequent right hip pain.  Upon arrival, she was hypoxic on 2L, O2 sats in the 70s with associated wheezing.  High sensitivity troponin 245 and 1281 respectively, BNP of 191, and hemoglobin of 9.8. Chest CT revealed right lower lobe atelectasis vs. pneumonia, advanced emphysema, but no evidence of a PE. ECG revealed sinus tachycardia with borderline ST/T wave abnormalities in inferior and anterior leads with IVCD. CT hip revealed nondisplaced stress fracture of the proximal right femoral diaphysis.    Ms. Schendel is unable to provide any history and no family members at bedside.    Vitals:   01/22/20 0439 01/22/20 0537 01/22/20 0547 01/22/20 0848  BP:  (!) 165/80 (!) 152/65   Pulse:  (!) 110 (!) 105   Resp:  17 17   Temp: 98.6 F (37 C) 98.1 F (36.7 C) 98.2 F (36.8 C)   TempSrc: Axillary Oral Oral   SpO2:  91% 92% 94%  Weight:   56.2 kg   Height:         Intake/Output Summary (Last 24 hours) at 01/22/2020 6962 Last data filed at 01/22/2020 0300 Gross per 24 hour  Intake 250 ml  Output 400 ml  Net -150 ml      PHYSICAL EXAM  General: Well developed, well nourished, in no acute distress HEENT:  Normocephalic and atramatic Neck:  No JVD.  Lungs: On supplemental O2 Heart: HRRR . Normal S1 and S2 without gallops or murmurs.  Abdomen: Bowel sounds are positive, abdomen soft and non-tender  Msk:  Back normal.  Normal strength and tone for age. Extremities: No clubbing, cyanosis or edema.   Neuro: N/A Psych:  N/A   LABS: Basic Metabolic Panel: Recent Labs    01/21/20 0253 01/22/20 0743  NA 133* 137  K 4.2 3.7  CL 96* 101  CO2 23 25  GLUCOSE 112* 132*  BUN 31* 19  CREATININE 1.06* 0.76  CALCIUM 8.9 8.3*   Liver Function Tests: Recent Labs     01/21/20 0253  AST 25  ALT 16  ALKPHOS 59  BILITOT 1.2  PROT 7.2  ALBUMIN 4.2   No results for input(s): LIPASE, AMYLASE in the last 72 hours. CBC: Recent Labs    01/21/20 0253  WBC 10.8*  NEUTROABS 8.0*  HGB 9.8*  HCT 29.4*  MCV 91.9  PLT 278   Cardiac Enzymes: No results for input(s): CKTOTAL, CKMB, CKMBINDEX, TROPONINI in the last 72 hours. BNP: Invalid input(s): POCBNP D-Dimer: No results for input(s): DDIMER in the last 72 hours. Hemoglobin A1C: No results for input(s): HGBA1C in the last 72 hours. Fasting Lipid Panel: No results for input(s): CHOL, HDL, LDLCALC, TRIG, CHOLHDL, LDLDIRECT in the last 72 hours. Thyroid Function Tests: No results for input(s): TSH, T4TOTAL, T3FREE, THYROIDAB in the last 72 hours.  Invalid input(s): FREET3 Anemia Panel: No results for input(s): VITAMINB12, FOLATE, FERRITIN, TIBC, IRON, RETICCTPCT in the last 72 hours.  CT Angio Chest PE W/Cm &/Or Wo Cm  Result Date: 01/21/2020 CLINICAL DATA:  Fall and shortness of breath. EXAM: CT ANGIOGRAPHY CHEST WITH CONTRAST TECHNIQUE: Multidetector CT imaging of the chest was performed using the standard protocol during bolus administration of intravenous contrast. Multiplanar CT image reconstructions and MIPs were obtained to evaluate the vascular anatomy. CONTRAST:  71mL OMNIPAQUE IOHEXOL 350 MG/ML SOLN COMPARISON:  06/09/2019 FINDINGS: Cardiovascular: Prominent right ventricular size. No pericardial effusion. Aortic and great vessel atherosclerotic calcification. No pulmonary artery filling defect is seen. There is significant motion artifact at the lung bases. Mediastinum/Nodes: Small to moderate sliding hiatal hernia. No adenopathy. Lungs/Pleura: Opacity in the right lower lobe with volume loss. No edema, effusion, or pneumothorax. Panlobular emphysema. Dystrophic calcifications in the apical lungs. Bronchomalacia. Upper Abdomen: Atherosclerotic calcification. Musculoskeletal: Healing anterior and  lateral left rib fractures. Review of the MIP images confirms the above findings. IMPRESSION: 1. Right lower lobe atelectasis versus pneumonia. 2. Moderately motion degraded chest CTA. No evidence of pulmonary embolism. 3. Advanced emphysema. Aortic Atherosclerosis (ICD10-I70.0) and Emphysema (ICD10-J43.9). Electronically Signed   By: Marnee Spring M.D.   On: 01/21/2020 05:55   CT Hip Right Wo Contrast  Result Date: 01/21/2020 CLINICAL DATA:  Hip trauma.  Fracture suspected. EXAM: CT OF THE RIGHT HIP WITHOUT CONTRAST TECHNIQUE: Multidetector CT imaging of the right hip was performed according to the standard protocol. Multiplanar CT image reconstructions were also generated. COMPARISON:  Hip x-rays from earlier same day. FINDINGS: Bones/Joint/Cartilage Linear lucency identified in the lateral cortex of the proximal right femoral diaphysis associated with focal cortical thickening. Imaging features compatible with nondisplaced stress fracture. The abnormality is included on the extreme inferior aspect of this study and is best visualized on sagittal reconstruction image 21 of series 5. Degenerative changes are noted in the right hip. Ligaments Suboptimally assessed by CT. Muscles and Tendons No substantial hemorrhage noted in the proximal thigh musculature. Soft tissues Distended urinary bladder is incompletely visualized. IMPRESSION: Linear lucency in the lateral cortex of the proximal right femoral diaphysis associated with focal cortical thickening. Imaging features compatible with nondisplaced stress fracture. Consider MRI of the proximal right femur for definitive characterization. Electronically Signed   By: Kennith Center M.D.   On: 01/21/2020 06:02   DG Chest Portable 1 View  Result Date: 01/21/2020 CLINICAL DATA:  Larey Seat, short of breath, hypoxia EXAM: PORTABLE CHEST 1 VIEW COMPARISON:  06/07/2019 FINDINGS: Single frontal view of the chest demonstrates a stable cardiac silhouette. Extensive bullous  emphysematous changes are seen, greatest at the right apex. Marked areas of scarring and fibrosis are seen throughout the lungs. Increased density at the right costophrenic angle may reflect consolidation and/or effusion. Chronic posttraumatic changes of the thoracic cage. No acute displaced fractures. IMPRESSION: 1. 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 2. Density at the right lateral lung base may reflect focal lung consolidation and/or small effusion. Electronically Signed   By: Sharlet Salina M.D.   On: 01/21/2020 03:48   ECHOCARDIOGRAM COMPLETE  Result Date: 01/21/2020    ECHOCARDIOGRAM REPORT   Patient Name:   [facility] [place] Date of Exam: 01/21/2020 Medical Rec #:  [id number]     Height:       66.0 in Accession #:    [id number]    Weight:       123.2 lb Date of Birth:  09/09/1940      BSA:          1.628 m Patient Age:    79 years      BP:           116/63 mmHg Patient Gender: F             HR:           74 bpm. Exam Location:  [facility] Procedure: 2D Echo, Cardiac Doppler and  Color Doppler Indications:     NSTEMI 121.4  History:         Patient has no prior history of Echocardiogram examinations.                  COPD; Risk Factors:Hypertension.  Sonographer:     Cristela Blue RDCS (AE) Referring Phys:  [id number] Vernetta Honey MANSY Diagnosing Phys: Debbe Odea MD IMPRESSIONS  1. Left ventricular ejection fraction, by estimation, is 60 to 65%. The left ventricle has normal function. The left ventricle has no regional wall motion abnormalities. Left ventricular diastolic parameters are consistent with Grade I diastolic dysfunction (impaired relaxation).  2. Right ventricular systolic function is normal. The right ventricular size is normal.  3. Left atrial size was moderately dilated.  4. The mitral valve is normal in structure. No evidence of mitral valve regurgitation. No evidence of mitral stenosis.  5. The aortic valve is normal in structure. Aortic valve regurgitation is not  visualized. No aortic stenosis is present.  6. The inferior vena cava is normal in size with greater than 50% respiratory variability, suggesting right atrial pressure of 3 mmHg. FINDINGS  Left Ventricle: Left ventricular ejection fraction, by estimation, is 60 to 65%. The left ventricle has normal function. The left ventricle has no regional wall motion abnormalities. The left ventricular internal cavity size was normal in size. There is  no left ventricular hypertrophy. Left ventricular diastolic parameters are consistent with Grade I diastolic dysfunction (impaired relaxation). Right Ventricle: The right ventricular size is normal. No increase in right ventricular wall thickness. Right ventricular systolic function is normal. Left Atrium: Left atrial size was moderately dilated. Right Atrium: Right atrial size was normal in size. Pericardium: There is no evidence of pericardial effusion. Mitral Valve: The mitral valve is normal in structure. Normal mobility of the mitral valve leaflets. No evidence of mitral valve regurgitation. No evidence of mitral valve stenosis. Tricuspid Valve: The tricuspid valve is normal in structure. Tricuspid valve regurgitation is not demonstrated. No evidence of tricuspid stenosis. Aortic Valve: The aortic valve is normal in structure. Aortic valve regurgitation is not visualized. No aortic stenosis is present. Aortic valve mean gradient measures 3.5 mmHg. Aortic valve peak gradient measures 6.5 mmHg. Aortic valve area, by VTI measures 2.46 cm. Pulmonic Valve: The pulmonic valve was not well visualized. Pulmonic valve regurgitation is not visualized. No evidence of pulmonic stenosis. Aorta: The aortic root is normal in size and structure. Venous: The inferior vena cava was not well visualized. The inferior vena cava is normal in size with greater than 50% respiratory variability, suggesting right atrial pressure of 3 mmHg. IAS/Shunts: No atrial level shunt detected by color flow  Doppler.  LEFT VENTRICLE PLAX 2D LVIDd:         4.77 cm  Diastology LVIDs:         3.08 cm  LV e' lateral:   7.83 cm/s LV PW:         1.15 cm  LV E/e' lateral: 11.6 LV IVS:        1.11 cm  LV e' medial:    5.87 cm/s LVOT diam:     2.00 cm  LV E/e' medial:  15.5 LV SV:         68 LV SV Index:   42 LVOT Area:     3.14 cm  RIGHT VENTRICLE RV Basal diam:  3.63 cm RV S prime:     12.40 cm/s TAPSE (M-mode): 3.6 cm LEFT ATRIUM  Index       RIGHT ATRIUM           Index LA diam:        4.80 cm 2.95 cm/m  RA Area:     15.10 cm LA Vol (A2C):   83.7 ml 51.42 ml/m RA Volume:   35.80 ml  21.99 ml/m LA Vol (A4C):   96.5 ml 59.28 ml/m LA Biplane Vol: 91.0 ml 55.90 ml/m  AORTIC VALVE                   PULMONIC VALVE AV Area (Vmax):    2.25 cm    PV Vmax:        0.92 m/s AV Area (Vmean):   2.51 cm    PV Peak grad:   3.4 mmHg AV Area (VTI):     2.46 cm    RVOT Peak grad: 5 mmHg AV Vmax:           127.00 cm/s AV Vmean:          86.450 cm/s AV VTI:            0.277 m AV Peak Grad:      6.5 mmHg AV Mean Grad:      3.5 mmHg LVOT Vmax:         90.90 cm/s LVOT Vmean:        69.000 cm/s LVOT VTI:          0.217 m LVOT/AV VTI ratio: 0.78  AORTA Ao Root diam: 3.50 cm MITRAL VALVE                TRICUSPID VALVE MV Area (PHT): 3.34 cm     TR Peak grad:   30.7 mmHg MV Decel Time: 227 msec     TR Vmax:        277.00 cm/s MV E velocity: 91.20 cm/s MV A velocity: 137.00 cm/s  SHUNTS MV E/A ratio:  0.67         Systemic VTI:  0.22 m                             Systemic Diam: 2.00 cm Debbe Odea MD Electronically signed by Debbe Odea MD Signature Date/Time: 01/21/2020/4:26:00 PM    Final    DG Hip Unilat W or Wo Pelvis 2-3 Views Right  Result Date: 01/21/2020 CLINICAL DATA:  Larey Seat, pain EXAM: DG HIP (WITH OR WITHOUT PELVIS) 2-3V RIGHT COMPARISON:  01/31/2017 FINDINGS: Frontal view of the pelvis as well as frontal and cross-table lateral views of the right hip are obtained. There is a nondisplaced transverse fracture  through the proximal femoral diaphysis. Alignment is anatomic. There are no other acute displaced fractures. The hips are well aligned. Mild bilateral hip osteoarthritis. Sacroiliac joints are normal. IMPRESSION: 1. Nondisplaced transverse fracture through the proximal right femoral diaphysis. Fracture line involves an area of chronic focal cortical thickening, and likely reflects an acute fracture at a site of chronic stress. Electronically Signed   By: Sharlet Salina M.D.   On: 01/21/2020 03:46     Echo: Normal RV and LV systolic function with an EF estimated between 60-65% with no regional wall abnormalities; grade 1 diastolic dysfunction.   TELEMETRY: Sinus rhythm   ASSESSMENT AND PLAN:  Active Problems:   Acute respiratory failure with hypoxia (HCC)   COPD exacerbation (HCC)   Non-STEMI (non-ST elevated myocardial infarction) (HCC)   1.  Acute on  chronic COPD exacerbation              -On 2 L of baseline, up to 12L of non re-breather              -Would continue to treat with steroids/nebulizer, antibiotics    2.  Elevated troponin              -Appears in the absence of chest pain              -Due to patient's underlying condition, we will opt to treat medically at this time; no plan for invasive workup             -Echocardiogram revealing normal LV systolic function with an EF estimated between 60-65% with no regional wall abnormalities   -will continue to treat medically. Will stop heparin today and continue with asa.   3.  Preoperative cardiac clearance              -Patient seen and evaluated by orthopedics; no plan for surgical intervention at this time    The history, physical exam findings, and plan of care were all discussed with Dr. Harold HedgeKenneth Dorathea Faerber, and all decision making was made in collaboration.   Andi Henceicole L Stephens  PA-C 01/22/2020 8:52 AM

## 2020-01-22 NOTE — ED Notes (Signed)
This RN received no call/update regarding pt's transfer upstairs. This RN  attempted to call and speak with 2A charge RN Alisa. Per RN Serina Cowper, they have consulted with their director and at this time they are not accepting any patients and will cap at 29; and re-asess the situation at a later date. ED Charge RN notified

## 2020-01-22 NOTE — Progress Notes (Signed)
At 0530, pt received to room 259 from ED.  Pt assist x4 to bed from stretcher.  Pt yelled out in pain.  Pt has fx pelis from fall prior to admission.  Pt deaf and speech is incomprehensible.  Pt can read but is very iimpatient.  Pt oriented to room and callbell.  Pt indicated that her pain while still is 2/10 on pain board.  NSR.   Pt is alert and appears oriented althugh it is hard to tell exactly how oriented she is with her inability to speak. Pt coughing up thick yellow sputum.  O2 at 6 liters .  Rt pedal area edematous.  Pedal pulses palpable.

## 2020-01-22 NOTE — Progress Notes (Signed)
ANTICOAGULATION CONSULT NOTE   Pharmacy Consult for Heparin Indication: chest pain/ACS  Allergies  Allergen Reactions  . Hydrocodone     Other reaction(s): Unknown  . Naproxen     Other reaction(s): Unknown  . Other Other (See Comments)    nuts  . Sulfamethoxazole-Trimethoprim     Other reaction(s): Unknown  . Tramadol     Other reaction(s): Unknown Pt has tolerated this med 1/5-1/7 with no issues    Patient Measurements: Height: 5\' 6"  (167.6 cm) Weight: 56.2 kg (124 lb) IBW/kg (Calculated) : 59.3 HEPARIN DW (KG): 55.9  Vital Signs: Temp: 97.6 F (36.4 C) (09/02 0909) Temp Source: Oral (09/02 0547) BP: 139/64 (09/02 0909) Pulse Rate: 101 (09/02 0909)  Labs: Recent Labs    01/21/20 0253 01/21/20 0540 01/21/20 0541 01/21/20 1446 01/21/20 2154 01/22/20 0743  HGB 9.8*  --   --   --   --   --   HCT 29.4*  --   --   --   --   --   PLT 278  --   --   --   --   --   APTT  --  29  --   --   --   --   LABPROT 13.5  --   --   --   --   --   INR 1.1  --   --   --   --   --   HEPARINUNFRC  --   --   --  0.12* 0.19* 0.35  CREATININE 1.06*  --   --   --   --  0.76  TROPONINIHS 245*  --  1,281*  --   --  2,276*    Estimated Creatinine Clearance: 50.6 mL/min (by C-G formula based on SCr of 0.76 mg/dL).   Medical History: Past Medical History:  Diagnosis Date  . COPD (chronic obstructive pulmonary disease) (HCC)   . Deaf   . Hypertension   . Osteoporosis     Medications:  Medications Prior to Admission  Medication Sig Dispense Refill Last Dose  . acetaminophen (TYLENOL) 325 MG tablet Take 650 mg by mouth in the morning, at noon, and at bedtime.   01/20/2020 at 2100  . acetaminophen (TYLENOL) 500 MG tablet Take 500 mg by mouth every 8 (eight) hours as needed for moderate pain or fever.   prn at prn  . albuterol (PROVENTIL) (2.5 MG/3ML) 0.083% nebulizer solution Take 2.5 mg by nebulization every 6 (six) hours as needed for wheezing or shortness of breath.   prn at prn   . albuterol (VENTOLIN HFA) 108 (90 Base) MCG/ACT inhaler Inhale 2 puffs into the lungs every 4 (four) hours as needed for wheezing or shortness of breath.   prn at prn  . alendronate (FOSAMAX) 70 MG tablet Take 70 mg by mouth once a week. Take with a full glass of water on an empty stomach.   Past Week at 0630  . aluminum-magnesium hydroxide-simethicone (MAALOX) 200-200-20 MG/5ML SUSP Take 30 mLs by mouth 4 (four) times daily -  before meals and at bedtime.   prn at prn  . aspirin EC 81 MG tablet Take 81 mg by mouth daily. Swallow whole.   01/20/2020 at 2100  . Calcium Carbonate-Vitamin D3 (CALCIUM 600-D) 600-400 MG-UNIT TABS Take 1 tablet by mouth 2 (two) times daily.   01/20/2020 at 1700  . cetirizine (ZYRTEC) 10 MG tablet Take 10 mg by mouth daily.   01/20/2020 at 2100  .  Cholecalciferol (VITAMIN D) 125 MCG (5000 UT) CAPS Take 5,000 Units by mouth daily.   01/20/2020 at 0900  . diclofenac sodium (VOLTAREN) 1 % GEL Apply 2 g topically 3 (three) times daily.   01/20/2020 at 2100  . diltiazem (CARDIZEM CD) 240 MG 24 hr capsule Take 240 mg by mouth daily.   01/20/2020 at 0900  . diphenhydrAMINE (BENADRYL) 25 MG tablet Take 25 mg by mouth every 6 (six) hours as needed for allergies.   prn at prn  . escitalopram (LEXAPRO) 10 MG tablet Take 10 mg by mouth at bedtime.   01/20/2020 at 2100  . feeding supplement, ENSURE ENLIVE, (ENSURE ENLIVE) LIQD Take 237 mLs by mouth 3 (three) times daily between meals. 237 mL 12 01/20/2020 at Unknown time  . ferrous sulfate 325 (65 FE) MG tablet Take 325 mg by mouth 2 (two) times daily with a meal.    01/20/2020 at 2100  . Fluticasone-Salmeterol (ADVAIR) 100-50 MCG/DOSE AEPB Inhale 1 puff into the lungs 2 (two) times daily.   01/20/2020 at 2100  . furosemide (LASIX) 20 MG tablet Take 20 mg by mouth daily.   01/20/2020 at 0900  . guaiFENesin (MUCINEX) 600 MG 12 hr tablet Take 600 mg by mouth daily.   01/20/2020 at 0800  . guaifenesin (ROBITUSSIN) 100 MG/5ML syrup Take 300 mg by  mouth 4 (four) times daily as needed for cough.   prn at prn  . loperamide (IMODIUM A-D) 2 MG tablet Take 2 mg by mouth daily as needed for diarrhea or loose stools.   prn at prn  . magnesium hydroxide (MILK OF MAGNESIA) 400 MG/5ML suspension Take 30 mLs by mouth daily as needed for mild constipation.   prn at prn  . metolazone (ZAROXOLYN) 2.5 MG tablet Take 2.5 mg by mouth 2 (two) times daily.   01/20/2020 at 2000  . omeprazole (PRILOSEC) 20 MG capsule Take 20 mg by mouth daily.   01/20/2020 at Unknown time  . ondansetron (ZOFRAN) 4 MG tablet Take 4 mg by mouth every 4 (four) hours as needed for nausea or vomiting.   prn at prn  . potassium chloride SA (KLOR-CON) 20 MEQ tablet Take 40 mEq by mouth daily.   01/20/2020 at 0800  . tiZANidine (ZANAFLEX) 4 MG tablet Take 4 mg by mouth daily.   01/20/2020 at 2100  . tolnaftate (ANTIFUNGAL) 1 % cream Apply 1 application topically as needed.   prn at prn  . traMADol (ULTRAM) 50 MG tablet Take 50 mg by mouth every 8 (eight) hours.   01/20/2020 at 2100  . azithromycin (ZITHROMAX) 250 MG tablet Daily for 9 days (Patient not taking: Reported on 01/21/2020) 9 each 0 Not Taking at Unknown time  . Multiple Vitamin (MULTIVITAMIN WITH MINERALS) TABS tablet Take 1 tablet by mouth daily. (Patient not taking: Reported on 01/21/2020) 30 tablet 0 Not Taking at Unknown time  . predniSONE (DELTASONE) 10 MG tablet Take 3 tablets (30 mg total) by mouth daily with breakfast. Take 30 mg day 1 then  -- 30mg  day 2 and so on  --20 mg  --20mg   --20mg   -10 mg  --10mg   --10mg  then stop (Patient not taking: Reported on 01/21/2020) 15 tablet 0 Not Taking at Unknown time   Assessment: 79 year old female admitted with fall and shortness of breath. PMH includes COPD, HTN, deafness and muteness. Troponin . Pharmacy is consulted to dose heparin for ACS. No anticoagulants PTA.  09/01 heparin consult for ACS - 3000 unit  bolus + infusion rate 650 units/hr 09/01 1517 HL 0.12 -  subtherapeutic - 1650 unit bolus + infusion rate increased to 800 units/hr 09/01 2154 HL 0.19 - subtherapeutic - 2000 unit bolus + infusion rate increased to 1000 units/hr 09/02 0743 HL 0.35 - therapeutic - continue rate of 1000 units/hr  Goal of Therapy:  Heparin level 0.3-0.7 units/ml Monitor platelets by anticoagulation protocol: Yes   Plan:  - HL therapeutic. Will continue current rate of 1000 units/hr. - Recheck HL in ~8 hours  - Monitor CBC with AM labs   Reatha Armour, PharmD Pharmacy Resident  01/22/2020 10:19 AM

## 2020-01-22 NOTE — TOC Progression Note (Signed)
Transition of Care [facility]) - Progression Note    Patient Details  Name: Alice Wallace MRN: [id number] Date of Birth: Nov 23, 1940  [facility] of [facility]) CM/SW Contact  Shawn Route, RN Phone Number: 01/22/2020, 10:14 AM  Clinical Narrative:    Left voicemail message with Legal guardian to return phone call.        Expected Discharge Plan and Services                                                 Social Determinants of Health (SDOH) Interventions    Readmission Risk Interventions No flowsheet data found.

## 2020-01-23 LAB — BASIC METABOLIC PANEL
Anion gap: 9 (ref 5–15)
BUN: 14 mg/dL (ref 8–23)
CO2: 25 mmol/L (ref 22–32)
Calcium: 8.6 mg/dL — ABNORMAL LOW (ref 8.9–10.3)
Chloride: 104 mmol/L (ref 98–111)
Creatinine, Ser: 0.53 mg/dL (ref 0.44–1.00)
GFR calc Af Amer: >60 mL/min (ref >60)
GFR calc non Af Amer: >60 mL/min (ref >60)
Glucose, Bld: 138 mg/dL — ABNORMAL HIGH (ref 70–99)
Potassium: 3.6 mmol/L (ref 3.5–5.1)
Sodium: 138 mmol/L (ref 135–145)

## 2020-01-23 LAB — CBC
HCT: 26 % — ABNORMAL LOW (ref 36.0–46.0)
Hemoglobin: 8.7 g/dL — ABNORMAL LOW (ref 12.0–15.0)
MCH: 30.9 pg (ref 26.0–34.0)
MCHC: 33.5 g/dL (ref 30.0–36.0)
MCV: 92.2 fL (ref 80.0–100.0)
Platelets: 259 10*3/uL (ref 150–400)
RBC: 2.82 MIL/uL — ABNORMAL LOW (ref 3.87–5.11)
RDW: 14.1 % (ref 11.5–15.5)
WBC: 10.3 10*3/uL (ref 4.0–10.5)
nRBC: 0 % (ref 0.0–0.2)

## 2020-01-23 MED ORDER — CYCLOBENZAPRINE HCL 10 MG PO TABS
5.0000 mg | ORAL_TABLET | Freq: Three times a day (TID) | ORAL | Status: DC | PRN
  Administered 2020-01-23: 5 mg via ORAL
  Filled 2020-01-23: qty 1

## 2020-01-23 MED ORDER — ENOXAPARIN SODIUM 40 MG/0.4ML ~~LOC~~ SOLN
40.0000 mg | SUBCUTANEOUS | Status: DC
  Administered 2020-01-23 – 2020-01-24 (×2): 40 mg via SUBCUTANEOUS
  Filled 2020-01-23 (×2): qty 0.4

## 2020-01-23 MED ORDER — LEVOFLOXACIN 500 MG PO TABS
500.0000 mg | ORAL_TABLET | Freq: Every day | ORAL | Status: AC
  Administered 2020-01-24 – 2020-01-25 (×2): 500 mg via ORAL
  Filled 2020-01-23 (×2): qty 1

## 2020-01-23 MED ORDER — METHYLPREDNISOLONE SODIUM SUCC 40 MG IJ SOLR
40.0000 mg | Freq: Two times a day (BID) | INTRAMUSCULAR | Status: DC
  Administered 2020-01-23 – 2020-01-24 (×2): 40 mg via INTRAVENOUS
  Filled 2020-01-23 (×2): qty 1

## 2020-01-23 MED ORDER — DEXTROMETHORPHAN POLISTIREX ER 30 MG/5ML PO SUER
30.0000 mg | Freq: Every evening | ORAL | Status: DC | PRN
  Administered 2020-01-25: 30 mg via ORAL
  Filled 2020-01-23 (×3): qty 5

## 2020-01-23 NOTE — Care Management Important Message (Signed)
Important Message  Patient Details  Name: Alice Wallace MRN: [id number] Date of Birth: [birth year]   Medicare Important Message Given:  Yes     [name] 01/23/2020, 2:05 PM

## 2020-01-23 NOTE — Progress Notes (Signed)
OT Cancellation Note  Patient Details Name: Alice Wallace MRN: [id number] DOB: 06-10-1940   Cancelled Treatment:    Reason Eval/Treat Not Completed: Medical issues which prohibited therapy  Pt with troponin trending up and MD note plan indicates PT/OT once stable. Will follow up for OT evaluation at later date/time as appropriate/able. Thank you.  Rejeana Brock, MS, OTR/L ascom [phone] 01/23/20, 11:37 AM

## 2020-01-23 NOTE — Progress Notes (Addendum)
PROGRESS NOTE    Patient: Alice Wallace                            PCP: Devoria Glassing, NP                    DOB: September 17, 1940            DOA: 01/21/2020 WJX:[id number]             DOS: 01/23/2020, 1:01 PM   LOS: 2 days   Date of Service: The patient was seen and examined on 01/23/2020  Subjective:   The patient was seen and examined this morning, stable does not appear to be any discomfort.  To gestures does not communicate any shortness of breath or chest pain. O2 demand has been reduced from 15 L to 6 L, now on 4 L satting 93%     Brief Narrative:   Alice Wallace  is a 79 y.o. Caucasian female with a known history of COPD, hypertension, deafness and muteness, presented to the emergency room with acute onset of fall at her assisted living facility with subsequent right hip pain and significant respiratory distress.  Her pulse currently was down to the 70s on her 2 L O2 by nasal cannula with associated coarse breath sounds and wheezing.  She was subsequently increased to 6 L, then 15 L via nonrebreather mask, to maintain O2 sat greater 92%.   She was thought to be in COPD exacerbation was given IV Solu-Medrol and nebulized albuterol as well as duo nebs by EMS on route to the hospital. She can read lips and written communications.  ED  RR 23, HR 106, 93-96% on 15 L of O2 via NRM   BNP of 191.6, high-sensitivity troponin I of 245 mL 1281 and CBC showed leukocytosis of 10.8 as well as anemia.  COVID-19 PCR came back negative.  EKG showed mild ectopic atrial tachycardia with  incomplete RBB, LVH with T wave inversion laterally.  Treated with aspirin, IV heparin, nebs, IV Versed, morphine Chest CTA that ruled out PE and showed right lower lobe atelectasis CT scan since suspicious for nondisplaced stress fracture of the proximal right femoral diaphysis with consideration for MRI.  [name] was notified about the patient.     Assessment & Plan:   Active Problems:   Non-STEMI (non-ST  elevated myocardial infarction) (HCC)   Acute respiratory failure with hypoxia (HCC)   COPD exacerbation (HCC)   Acute on chronic respiratory failure with COPD exacerbation /bronchitis, cannot rule out pneumonia -Continue to improve, patient has been weaned off 15 L to 6 L, now on 4 L of oxygen, satting 93% -Goal is to taper the patient off oxygen or to maintain O2 greater 92% -Continue DuoNeb bronchodilators scheduled and as needed -We will continue IV steroids will not taper today  -We will continue with antibiotics IV Rocephin, azithromycin >>> cultures negative, modifying antibiotics to p.o. total of 5 days -We will hold off Advair Diskus.  -Status post CT angiogram chest -negative for pulmonary embolism  CT angiogram IMPRESSION: 1. Right lower lobe atelectasis versus pneumonia. 2. Moderately motion degraded chest CTA. No evidence of pulmonary embolism. 3. Advanced emphysema. Aortic Atherosclerosis   ?  History of CHF diastolic failure.. Echo reviewed no signs of overt diastolic failure, ejection fraction preserved 60-65% -Monitoring daily weight -For no clear reason patient is on Zaroxolyn and Lasix at home will be resumed -Appreciate cardiology  input -No signs of edema or fluid retention   Suspected non-STEMI. -Denies any chest pain, shortness of breath improving -Elevated troponin ... Last troponin XX 276 -Nonspecific EKG changes -The patient will be continued on IV heparin.. Discontinued 9-21 -On aspirin, statins, as needed sublingual nitroglycerin and morphine -2D echo reviewed ejection fraction 60-65% negative for any regional or global abnormality -Cardiology following closely ... Not recommending intervention at this time, medical management Dr. [name] following   Hypertension. -Monitor very closely, continue home medication Cardizem CD -Patient is currently receiving as needed nitroglycerin -Remained stable  Proximal right femoral diaphysis fracture -Ortho  consulted -stating that fracture is suspicious of bisphosphonate related side effect recommending discontinuing alendronate Ortho recommending nonweightbearing on right hip, close follow-up in clinic Stating will likely need prophylactic femoral nailing -Ortho recommending no intervention -PT/OT once patient stable  Depression -We will continue home medication of Lexapro,  Vitamin D deficiency Continue supplements   GERD Continue Protonix -Stable  Hypokalemia Repleting accordingly, monitoring   Patient is deaf, and mute -But able to communicate through gestures   Nutritional status:          Consultants:  Cardiology/orthopedic   --------------------------------------------------------------------------------------------------------------------------------------------- DVT prophylaxis: Heparin subcu (Heparin Gtt discontinued) Code Status:   Code Status: Full Code Family Communication: No family member present at bedside- attempt will be made to update daily The above findings and plan of care has been discussed with patient (and family )  in detail,  they expressed understanding and agreement of above. -Advance care planning has been discussed.   Admission status:    Status is: Inpatient  Remains inpatient appropriate because:Inpatient level of care appropriate due to severity of illness   Dispo: The patient is from:  assisted living              Anticipated d/c is to: SNF              Anticipated d/c date is: In a.m. to SNF              Patient currently is not medically stable to d/c.        Procedures:   No admission procedures for hospital encounter.   Antimicrobials:  Anti-infectives (From admission, onward)   Start     Dose/Rate Route Frequency Ordered Stop   01/24/20 1000  levofloxacin (LEVAQUIN) tablet 500 mg        500 mg Oral Daily 01/23/20 1126 01/26/20 0959   01/21/20 1530  ceFAZolin (ANCEF) IVPB 2g/100 mL premix  Status:  Discontinued         2 g 200 mL/hr over 30 Minutes Intravenous  Once 01/21/20 0738 01/21/20 1009   01/21/20 0730  azithromycin (ZITHROMAX) 500 mg in sodium chloride 0.9 % 250 mL IVPB  Status:  Discontinued        500 mg 250 mL/hr over 60 Minutes Intravenous Every 24 hours 01/21/20 0615 01/23/20 1123   01/21/20 0700  cefTRIAXone (ROCEPHIN) 1 g in sodium chloride 0.9 % 100 mL IVPB  Status:  Discontinued        1 g 200 mL/hr over 30 Minutes Intravenous Every 24 hours 01/21/20 0615 01/23/20 1123       Medication:  . alum & mag hydroxide-simeth  30 mL Oral TID AC & HS  . aspirin EC  81 mg Oral Daily  . atorvastatin  80 mg Oral Daily  . calcium-vitamin D  1 tablet Oral BID  . Chlorhexidine Gluconate Cloth  6 each Topical Q0600  .  cholecalciferol  5,000 Units Oral Daily  . clopidogrel  75 mg Oral Daily  . diltiazem  240 mg Oral Daily  . enoxaparin (LOVENOX) injection  40 mg Subcutaneous Q24H  . escitalopram  10 mg Oral Daily  . feeding supplement (ENSURE ENLIVE)  237 mL Oral TID BM  . ferrous sulfate  325 mg Oral BID WC  . furosemide  20 mg Oral Daily  . guaiFENesin  600 mg Oral Daily  . ipratropium-albuterol  3 mL Nebulization QID  . [START ON 01/24/2020] levofloxacin  500 mg Oral Daily  . loratadine  10 mg Oral Daily  . methylPREDNISolone (SOLU-MEDROL) injection  40 mg Intravenous Q12H  . metolazone  2.5 mg Oral BID  . mupirocin ointment  1 application Nasal BID  . pantoprazole  40 mg Oral Daily  . potassium chloride SA  40 mEq Oral Daily  . tiZANidine  4 mg Oral Daily    acetaminophen **OR** acetaminophen, albuterol, cyclobenzaprine, dextromethorphan, diphenhydrAMINE, loperamide, magnesium hydroxide, morphine injection, nitroGLYCERIN, ondansetron **OR** ondansetron (ZOFRAN) IV, traZODone   Objective:   Vitals:   01/23/20 0842 01/23/20 0842 01/23/20 0922 01/23/20 1139  BP: (!) 153/67 (!) 153/67  (!) 104/44  Pulse:  86  75  Resp:  20  18  Temp:  97.8 F (36.6 C)  97.8 F (36.6 C)    TempSrc:  Oral  Oral  SpO2:  91% 96% 93%  Weight:      Height:        Intake/Output Summary (Last 24 hours) at 01/23/2020 1301 Last data filed at 01/23/2020 0950 Gross per 24 hour  Intake 1437 ml  Output 4275 ml  Net -2838 ml   Filed Weights   01/21/20 0259 01/22/20 0547  Weight: 55.9 kg 56.2 kg     Examination:       Physical Exam:   General:  Alert, oriented, cooperative, no distress;   HEENT:  Normocephalic, PERRL, otherwise with in Normal limits   Neuro:  CNII-XII intact. , normal motor and sensation, reflexes intact   Lungs:   Clear to auscultation BL, Respirations unlabored, no wheezes / crackles  Cardio:    S1/S2, RRR, No murmure, No Rubs or Gallops   Abdomen:   Soft, non-tender, bowel sounds active all four quadrants,  no guarding or peritoneal signs.  Muscular skeletal:   Hip pain range of motion limited due to hip pain Limited exam - in bed, able to move all 3 extremities, generalized weaknesses 2+ pulses,  symmetric, No pitting edema  Skin:  Dry, warm to touch, negative for any Rashes, No open wounds  Wounds: Please see nursing documentation Pressure Injury 05/27/19 Ischial tuberosity Left Unstageable - Full thickness tissue loss in which the base of the injury is covered by slough (yellow, tan, gray, green or brown) and/or eschar (tan, brown or black) in the wound bed. (Active)  05/27/19 1630  Location: Ischial tuberosity  Location Orientation: Left  Staging: Unstageable - Full thickness tissue loss in which the base of the injury is covered by slough (yellow, tan, gray, green or brown) and/or eschar (tan, brown or black) in the wound bed.  Wound Description (Comments):   Present on Admission: Yes              ------------------------------------------------------------------------------------------------------------------------------------------    LABs:  CBC Latest Ref Rng & Units 01/23/2020 01/22/2020 01/21/2020  WBC 4.0 - 10.5 K/uL 10.3 12.0(H)  10.8(H)  Hemoglobin 12.0 - 15.0 g/dL 3.6(O) 2.9(U) 7.6(L)  Hematocrit 36 - 46 %  26.0(L) 24.1(L) 29.4(L)  Platelets 150 - 400 K/uL 259 246 278   CMP Latest Ref Rng & Units 01/23/2020 01/22/2020 01/22/2020  Glucose 70 - 99 mg/dL 916(O) 060(O) 459(X)  BUN 8 - 23 mg/dL 14 17 19   Creatinine 0.44 - 1.00 mg/dL 7.74 1.42  Sodium 135 - 145 mmol/L 138 136 137  Potassium 3.5 - 5.1 mmol/L 3.6 4.2 3.7  Chloride 98 - 111 mmol/L 104 102 101  CO2 22 - 32 mmol/L 25 24 25   Calcium 8.9 - 10.3 mg/dL 3.95) ) 8.3(L)  Total Protein 6.5 - 8.1 g/dL - 5.4(L) -  Total Bilirubin 0.3 - 1.2 mg/dL - 0.6 -  Alkaline Phos 38 - 126 U/L - 53 -  AST 15 - 41 U/L - 37 -  ALT 0 - 44 U/L - 30 -       Micro Results Recent Results (from the past 240 hour(s))  SARS Coronavirus 2 by RT PCR (hospital order, performed in [facility] hospital lab) Nasopharyngeal Nasopharyngeal Swab     Status: None   Collection Time: 01/21/20  2:53 AM   Specimen: Nasopharyngeal Swab  Result Value Ref Range Status   SARS Coronavirus 2 NEGATIVE NEGATIVE Final    Comment: (NOTE) SARS-CoV-2 target nucleic acids are NOT DETECTED.  The SARS-CoV-2 RNA is generally detectable in upper and lower respiratory specimens during the acute phase of infection. The lowest concentration of SARS-CoV-2 viral copies this assay can detect is 250 copies / mL. A negative result does not preclude SARS-CoV-2 infection and should not be used as the sole basis for treatment or other patient management decisions.  A negative result may occur with improper specimen collection / handling, submission of specimen other than nasopharyngeal swab, presence of viral mutation(s) within the areas targeted by this assay, and inadequate number of viral copies (<250 copies / mL). A negative result must be combined with clinical observations, patient history, and epidemiological information.  Fact Sheet for Patients:   [facility]  Fact  Sheet for Healthcare Providers: 03/22/20  This test is not yet approved or  cleared by the [employer] FDA and has been authorized for detection and/or diagnosis of SARS-CoV-2 by FDA under an Emergency Use Authorization (EUA).  This EUA will remain in effect (meaning this test can be used) for the duration of the COVID-19 declaration under Section 564(b)(1) of the Act, 21 U.S.C. section 360bbb-3(b)(1), unless the authorization is terminated or revoked sooner.  Performed at [facility], [address] [place], [address] [place]   MRSA PCR Screening     Status: Abnormal   Collection Time: 01/22/20  5:15 PM   Specimen: Nasopharyngeal  Result Value Ref Range Status   MRSA by PCR POSITIVE (A) NEGATIVE Final    Comment:        The GeneXpert MRSA Assay (FDA approved for NASAL specimens only), is one component of a comprehensive MRSA colonization surveillance program. It is not intended to diagnose MRSA infection nor to guide or monitor treatment for MRSA infections. RESULT CALLED TO, READ BACK BY AND VERIFIED WITH: BEBRA EMBDEN 01/22/20 AT 2211 BY ACR Performed at [facility], [address] [place], [address] [place]     Radiology Reports CT Angio Chest PE W/Cm &/Or Wo Cm  Result Date: 01/21/2020 CLINICAL DATA:  Fall and shortness of breath. EXAM: CT ANGIOGRAPHY CHEST WITH CONTRAST TECHNIQUE: Multidetector CT imaging of the chest was performed using the standard protocol during bolus administration of intravenous contrast. Multiplanar  CT image reconstructions and MIPs were obtained to evaluate the vascular anatomy. CONTRAST:  75mL OMNIPAQUE IOHEXOL 350 MG/ML SOLN COMPARISON:  06/09/2019 FINDINGS: Cardiovascular: Prominent right ventricular size. No pericardial effusion. Aortic and great vessel atherosclerotic calcification. No pulmonary artery filling defect is seen. There is significant motion artifact at the lung bases.  Mediastinum/Nodes: Small to moderate sliding hiatal hernia. No adenopathy. Lungs/Pleura: Opacity in the right lower lobe with volume loss. No edema, effusion, or pneumothorax. Panlobular emphysema. Dystrophic calcifications in the apical lungs. Bronchomalacia. Upper Abdomen: Atherosclerotic calcification. Musculoskeletal: Healing anterior and lateral left rib fractures. Review of the MIP images confirms the above findings. IMPRESSION: 1. Right lower lobe atelectasis versus pneumonia. 2. Moderately motion degraded chest CTA. No evidence of pulmonary embolism. 3. Advanced emphysema. Aortic Atherosclerosis (ICD10-I70.0) and Emphysema (ICD10-J43.9). Electronically Signed   By: Marnee SpringJonathon  Watts M.D.   On: 01/21/2020 05:55   CT Hip Right Wo Contrast  Result Date: 01/21/2020 CLINICAL DATA:  Hip trauma.  Fracture suspected. EXAM: CT OF THE RIGHT HIP WITHOUT CONTRAST TECHNIQUE: Multidetector CT imaging of the right hip was performed according to the standard protocol. Multiplanar CT image reconstructions were also generated. COMPARISON:  Hip x-rays from earlier same day. FINDINGS: Bones/Joint/Cartilage Linear lucency identified in the lateral cortex of the proximal right femoral diaphysis associated with focal cortical thickening. Imaging features compatible with nondisplaced stress fracture. The abnormality is included on the extreme inferior aspect of this study and is best visualized on sagittal reconstruction image 21 of series 5. Degenerative changes are noted in the right hip. Ligaments Suboptimally assessed by CT. Muscles and Tendons No substantial hemorrhage noted in the proximal thigh musculature. Soft tissues Distended urinary bladder is incompletely visualized. IMPRESSION: Linear lucency in the lateral cortex of the proximal right femoral diaphysis associated with focal cortical thickening. Imaging features compatible with nondisplaced stress fracture. Consider MRI of the proximal right femur for definitive  characterization. Electronically Signed   By: Kennith CenterEric  Mansell M.D.   On: 01/21/2020 06:02   DG Chest Portable 1 View  Result Date: 01/21/2020 CLINICAL DATA:  Larey SeatFell, short of breath, hypoxia EXAM: PORTABLE CHEST 1 VIEW COMPARISON:  06/07/2019 FINDINGS: Single frontal view of the chest demonstrates a stable cardiac silhouette. Extensive bullous emphysematous changes are seen, greatest at the right apex. Marked areas of scarring and fibrosis are seen throughout the lungs. Increased density at the right costophrenic angle may reflect consolidation and/or effusion. Chronic posttraumatic changes of the thoracic cage. No acute displaced fractures. IMPRESSION: 1. 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 2. Density at the right lateral lung base may reflect focal lung consolidation and/or small effusion. Electronically Signed   By: Sharlet SalinaMichael  Brown M.D.   On: 01/21/2020 03:48   ECHOCARDIOGRAM COMPLETE  Result Date: 01/21/2020    ECHOCARDIOGRAM REPORT   [facility] Date of Exam: 01/21/2020 Medical Rec #:  [id number]     Height:       66.0 in Accession #:    [id number]    Weight:       123.2 lb Date of Birth:  01/31/1941      BSA:          1.628 m Patient Age:    79 years      BP:           116/63 mmHg Patient Gender: F             HR:  74 bpm. Exam Location:  [facility] Procedure: 2D Echo, Cardiac Doppler and Color Doppler Indications:     NSTEMI 121.4  History:         Patient has no prior history of Echocardiogram examinations.                  COPD; Risk Factors:Hypertension.  Sonographer:     Cristela Blue RDCS (AE) Referring Phys:  [id number] Vernetta Honey MANSY Diagnosing Phys: Debbe Odea MD IMPRESSIONS  1. Left ventricular ejection fraction, by estimation, is 60 to 65%. The left ventricle has normal function. The left ventricle has no regional wall motion abnormalities. Left ventricular diastolic parameters are consistent with Grade I diastolic dysfunction (impaired  relaxation).  2. Right ventricular systolic function is normal. The right ventricular size is normal.  3. Left atrial size was moderately dilated.  4. The mitral valve is normal in structure. No evidence of mitral valve regurgitation. No evidence of mitral stenosis.  5. The aortic valve is normal in structure. Aortic valve regurgitation is not visualized. No aortic stenosis is present.  6. The inferior vena cava is normal in size with greater than 50% respiratory variability, suggesting right atrial pressure of 3 mmHg. FINDINGS  Left Ventricle: Left ventricular ejection fraction, by estimation, is 60 to 65%. The left ventricle has normal function. The left ventricle has no regional wall motion abnormalities. The left ventricular internal cavity size was normal in size. There is  no left ventricular hypertrophy. Left ventricular diastolic parameters are consistent with Grade I diastolic dysfunction (impaired relaxation). Right Ventricle: The right ventricular size is normal. No increase in right ventricular wall thickness. Right ventricular systolic function is normal. Left Atrium: Left atrial size was moderately dilated. Right Atrium: Right atrial size was normal in size. Pericardium: There is no evidence of pericardial effusion. Mitral Valve: The mitral valve is normal in structure. Normal mobility of the mitral valve leaflets. No evidence of mitral valve regurgitation. No evidence of mitral valve stenosis. Tricuspid Valve: The tricuspid valve is normal in structure. Tricuspid valve regurgitation is not demonstrated. No evidence of tricuspid stenosis. Aortic Valve: The aortic valve is normal in structure. Aortic valve regurgitation is not visualized. No aortic stenosis is present. Aortic valve mean gradient measures 3.5 mmHg. Aortic valve peak gradient measures 6.5 mmHg. Aortic valve area, by VTI measures 2.46 cm. Pulmonic Valve: The pulmonic valve was not well visualized. Pulmonic valve regurgitation is not  visualized. No evidence of pulmonic stenosis. Aorta: The aortic root is normal in size and structure. Venous: The inferior vena cava was not well visualized. The inferior vena cava is normal in size with greater than 50% respiratory variability, suggesting right atrial pressure of 3 mmHg. IAS/Shunts: No atrial level shunt detected by color flow Doppler.  LEFT VENTRICLE PLAX 2D LVIDd:         4.77 cm  Diastology LVIDs:         3.08 cm  LV e' lateral:   7.83 cm/s LV PW:         1.15 cm  LV E/e' lateral: 11.6 LV IVS:        1.11 cm  LV e' medial:    5.87 cm/s LVOT diam:     2.00 cm  LV E/e' medial:  15.5 LV SV:         68 LV SV Index:   42 LVOT Area:     3.14 cm  RIGHT VENTRICLE RV Basal diam:  3.63 cm RV S prime:  12.40 cm/s TAPSE (M-mode): 3.6 cm LEFT ATRIUM             Index       RIGHT ATRIUM           Index LA diam:        4.80 cm 2.95 cm/m  RA Area:     15.10 cm LA Vol (A2C):   83.7 ml 51.42 ml/m RA Volume:   35.80 ml  21.99 ml/m LA Vol (A4C):   96.5 ml 59.28 ml/m LA Biplane Vol: 91.0 ml 55.90 ml/m  AORTIC VALVE                   PULMONIC VALVE AV Area (Vmax):    2.25 cm    PV Vmax:        0.92 m/s AV Area (Vmean):   2.51 cm    PV Peak grad:   3.4 mmHg AV Area (VTI):     2.46 cm    RVOT Peak grad: 5 mmHg AV Vmax:           127.00 cm/s AV Vmean:          86.450 cm/s AV VTI:            0.277 m AV Peak Grad:      6.5 mmHg AV Mean Grad:      3.5 mmHg LVOT Vmax:         90.90 cm/s LVOT Vmean:        69.000 cm/s LVOT VTI:          0.217 m LVOT/AV VTI ratio: 0.78  AORTA Ao Root diam: 3.50 cm MITRAL VALVE                TRICUSPID VALVE MV Area (PHT): 3.34 cm     TR Peak grad:   30.7 mmHg MV Decel Time: 227 msec     TR Vmax:        277.00 cm/s MV E velocity: 91.20 cm/s MV A velocity: 137.00 cm/s  SHUNTS MV E/A ratio:  0.67         Systemic VTI:  0.22 m                             Systemic Diam: 2.00 cm Debbe Odea MD Electronically signed by Debbe Odea MD Signature Date/Time: 01/21/2020/4:26:00 PM     Final    DG Hip Unilat W or Wo Pelvis 2-3 Views Right  Result Date: 01/21/2020 CLINICAL DATA:  Larey Seat, pain EXAM: DG HIP (WITH OR WITHOUT PELVIS) 2-3V RIGHT COMPARISON:  01/31/2017 FINDINGS: Frontal view of the pelvis as well as frontal and cross-table lateral views of the right hip are obtained. There is a nondisplaced transverse fracture through the proximal femoral diaphysis. Alignment is anatomic. There are no other acute displaced fractures. The hips are well aligned. Mild bilateral hip osteoarthritis. Sacroiliac joints are normal. IMPRESSION: 1. Nondisplaced transverse fracture through the proximal right femoral diaphysis. Fracture line involves an area of chronic focal cortical thickening, and likely reflects an acute fracture at a site of chronic stress. Electronically Signed   By: Sharlet Salina M.D.   On: 01/21/2020 03:46    SIGNED: Kendell Bane, MD, FACP, FHM. [facility],  Pager (please use [email or web address] to page/text)  If 7PM-7AM, please contact night-coverage [email or web address], Password [facility] 01/23/2020, 1:01 PM

## 2020-01-23 NOTE — Evaluation (Signed)
Physical Therapy Evaluation Patient Details Name: Alice Wallace MRN: [id number] DOB: [birth year] Today's Date: 01/23/2020   History of Present Illness  Per MD notes: Pt is a 79 y.o. caucasian female with a known history of COPD, hypertension, deafness and muteness, presented to the emergency room with acute onset of fall at her assisted living facility with subsequent right hip pain and significant respiratory distress.  She was thought to be in COPD exacerbation was given IV Solu-Medrol and nebulized albuterol as well as duo nebs by EMS on route to the hospital.  Pt found to have a proximal right femoral diaphysis fracture with no surgical intervention planned at this time.  MD assessment also includes: Acute on chronic respiratory failure with COPD exacerbation /bronchitis, cannot rule out pneumonia, h/o CHF, suspected NSTEMI, HTN, hypokalemia, and depression.    Clinical Impression  Per Dr. Flossie Dibble, ok for patient to participate with PT at this time.  Cardiology/Dr. [name] stated no restrictions during PT as long as there was no chest pain or exertional dyspnea.  Pt was pleasant and motivated to participate during the session and put forth good effort throughout.  Pt was able to follow cues from gestures as well as written cues on dry erase board.  Pt required min to mod A with bed mobility tasks and min A to come to standing.  Pt was able to comply with RLE NWB status throughout.  Pt's SpO2 dropped to the min 80s with standing so hop-to amb attempt was deferred.  Upon returning to sitting with cues for PLB pt's SpO2 quickly returned to the upper 80s to 90%.  Pt will benefit from PT services in a SNF setting upon discharge to safely address deficits listed in patient problem list for decreased caregiver assistance and eventual return to PLOF.      Follow Up Recommendations SNF    Equipment Recommendations  Other (comment) (TBD at next venue of care)    Recommendations for Other Services        Precautions / Restrictions Precautions Precautions: Fall Restrictions Weight Bearing Restrictions: Yes RLE Weight Bearing: Non weight bearing Other Position/Activity Restrictions: Per cardiology/Dr. [name] ok for patient to participate with PT as long as there is no chest pain or exertional dyspnea      Mobility  Bed Mobility Overal bed mobility: Needs Assistance Bed Mobility: Rolling;Supine to Sit;Sit to Supine Rolling: Min assist   Supine to sit: Mod assist Sit to supine: Mod assist   General bed mobility comments: Written cues to alert patient on desired tasks with pt putting forth good effort but limited by R hip pain with mobility  Transfers Overall transfer level: Needs assistance Equipment used: Rolling walker (2 wheeled) Transfers: Sit to/from Stand Sit to Stand: Min Assist         General transfer comment: Written cues to alert patient on desired tasks and WB status; pt's right foot placed on top of this PT's foot to ensure WB compliance with pt able to fully comply throughout  Ambulation/Gait             General Gait Details: NT secondary to pt fatigue with static standing  Stairs            Wheelchair Mobility    Modified Rankin (Stroke Patients Only)       Balance Overall balance assessment: Needs assistance   Sitting balance-Leahy Scale: Fair     Standing balance support: Bilateral upper extremity supported Standing balance-Leahy Scale: Poor Standing balance comment: Mod lean  on the RW for support with history of falls                             Pertinent Vitals/Pain Pain Assessment: Faces Faces Pain Scale: Hurts little more Pain Location: R hip with mobility (no pain at rest) Pain Descriptors / Indicators: Grimacing;Moaning Pain Intervention(s): Premedicated before session;Monitored during session;Limited activity within patient's tolerance    Home Living Family/patient expects to be discharged to:: Assisted living                Home Equipment: Walker - 4 wheels;Bedside commode      Prior Function Level of Independence: Needs assistance   Gait / Transfers Assistance Needed: Mod Ind amb in ALF with a rollator, 2 falls in the last 6-9 months  ADL's / Homemaking Assistance Needed: Ind with dressing and assist from staff for bathing        Hand Dominance        Extremity/Trunk Assessment   Upper Extremity Assessment Upper Extremity Assessment: Defer to OT evaluation    Lower Extremity Assessment Lower Extremity Assessment: Generalized weakness;RLE deficits/detail RLE: Unable to fully assess due to pain       Communication   Communication: Deaf;Other (comment) (Deaf and mute, reads lips and dry erase board)  Cognition Arousal/Alertness: Awake/alert Behavior During Therapy: WFL for tasks assessed/performed Overall Cognitive Status: Within Functional Limits for tasks assessed                                        General Comments      Exercises Other Exercises Other Exercises: Rolling left and right with visual cues for sequencing Other Exercises: PLB written and visual instruction Other Exercises: Seated therex including APs and LAQs   Assessment/Plan    PT Assessment Patient needs continued PT services  PT Problem List Decreased strength;Decreased activity tolerance;Decreased balance;Decreased mobility;Decreased knowledge of use of DME;Decreased knowledge of precautions;Pain       PT Treatment Interventions DME instruction;Gait training;Functional mobility training;Therapeutic activities;Therapeutic exercise;Balance training;Patient/family education    PT Goals (Current goals can be found in the Care Plan section)  Acute Rehab PT Goals PT Goal Formulation: Patient unable to participate in goal setting Time For Goal Achievement: 02/05/20 Potential to Achieve Goals: Good    Frequency 7X/week   Barriers to discharge Inaccessible home  environment;Decreased caregiver support      Co-evaluation PT/OT/SLP Co-Evaluation/Treatment: Yes Reason for Co-Treatment: Complexity of the patient's impairments (multi-system involvement);For patient/therapist safety;To address functional/ADL transfers PT goals addressed during session: Mobility/safety with mobility;Strengthening/ROM OT goals addressed during session: ADL's and self-care;Strengthening/ROM       AM-PAC PT "6 Clicks" Mobility  Outcome Measure Help needed turning from your back to your side while in a flat bed without using bedrails?: A Lot Help needed moving from lying on your back to sitting on the side of a flat bed without using bedrails?: A Lot Help needed moving to and from a bed to a chair (including a wheelchair)?: A Lot Help needed standing up from a chair using your arms (e.g., wheelchair or bedside chair)?: A Little Help needed to walk in hospital room?: Total Help needed climbing 3-5 steps with a railing? : Total 6 Click Score: 11    End of Session Equipment Utilized During Treatment: Gait belt;Oxygen Activity Tolerance: Patient limited by fatigue Patient left: in  bed;Other (comment) (Pt left with OT) Nurse Communication: Mobility status;Other (comment) (SpO2 response to activity; dark, tarry stool during session) PT Visit Diagnosis: Unsteadiness on feet (R26.81);History of falling (Z91.81);Difficulty in walking, not elsewhere classified (R26.2);Muscle weakness (generalized) (M62.81);Pain Pain - Right/Left: Right Pain - part of body: Hip    Time: 1423-9532 PT Time Calculation (min) (ACUTE ONLY): 37 min   Charges:   PT Evaluation $PT Eval Moderate Complexity: 1 Mod PT Treatments $Therapeutic Activity: 8-22 mins       D. Elly Modena PT, DPT 01/23/20, 3:34 PM

## 2020-01-23 NOTE — TOC Initial Note (Signed)
Transition of Care [facility]) - Initial/Assessment Note    Patient Details  Name: Alice Wallace MRN: [id number] Date of Birth: 04/24/1941  Transition of Care [facility] CM/SW Contact:    Shawn Route, RN Phone Number: 01/23/2020, 3:38 PM  Clinical Narrative:                  Patient is resident at the [facility] of [address]  Under her current level of functioning she will be unable to return to the [facility] per [place].  Spoke with Annell Greening, DSS guardian.  She understands patient needs SNF admission. Awaiting pt/ot evals at this time.   Expected Discharge Plan: Skilled Nursing Facility Barriers to Discharge: Continued Medical Work up   Patient Goals and CMS Choice        Expected Discharge Plan and Services Expected Discharge Plan: Skilled Nursing Facility   Discharge Planning Services: CM Consult   Living arrangements for the past 2 months: Assisted Living Facility                                      Prior Living Arrangements/Services Living arrangements for the past 2 months: Assisted Living Facility Lives with:: Facility Resident                   Activities of Daily Living      Permission Sought/Granted                  Emotional Assessment              Admission diagnosis:  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] Elevated troponin level [R77.8] Demand ischemia (HCC) [I24.8] COPD exacerbation (HCC) [J44.1] Chronic obstructive pulmonary disease with acute exacerbation (HCC) [J44.1] Fall, initial encounter [W19.XXXA] Acute on chronic respiratory failure with hypoxia (HCC) [J96.21] Nondisplaced fracture of base of neck of right femur, initial encounter for closed fracture [facility]) [S72.044A] Patient Active Problem List   Diagnosis Date Noted  . COPD exacerbation (HCC) 01/21/2020  . Non-STEMI (non-ST elevated myocardial infarction) (HCC) 01/21/2020  . Acute respiratory failure with hypoxia (HCC)   . Pneumonia due to COVID-19 virus 06/07/2019  . Hypokalemia  06/07/2019  . AKI (acute kidney injury) (HCC) 06/07/2019  . Nonspeaking deaf 06/07/2019  . Acute hypoxemic respiratory failure due to COVID-19 (HCC) 05/27/2019  . COVID-19 virus infection 05/27/2019   PCP:  Devoria Glassing, NP Pharmacy:  No Pharmacies Listed    Social Determinants of Health (SDOH) Interventions    Readmission Risk Interventions No flowsheet data found.

## 2020-01-23 NOTE — Evaluation (Signed)
Occupational Therapy Evaluation Patient Details Name: Alice Wallace MRN: [id number] DOB: 12-25-1940 Today's Date: 01/23/2020    History of Present Illness Per MD notes: Pt is a 79 y.o. caucasian female with a known history of COPD, hypertension, deafness and muteness, presented to the emergency room with acute onset of fall at her assisted living facility with subsequent right hip pain and significant respiratory distress.  She was thought to be in COPD exacerbation was given IV Solu-Medrol and nebulized albuterol as well as duo nebs by EMS on route to the hospital.  Pt found to have a proximal right femoral diaphysis fracture with no surgical intervention planned at this time.  MD assessment also includes: Acute on chronic respiratory failure with COPD exacerbation /bronchitis, cannot rule out pneumonia, h/o CHF, suspected NSTEMI, HTN, hypokalemia, and depression.   Clinical Impression   Pt was seen for OT evaluation this date. Prior to hospital admission, pt was MOD I with fxl mobility with 4WW and required some assist for ADLs including bathing, but could otherwise complete toileting and grooming. Pt lives ALF. Currently pt demonstrates impairments as described below (See OT problem list) which functionally limit her ability to perform ADL/self-care tasks. Pt currently requires MIN A/CGA with ADL trasnfers with RW, MIN/MOD A for bed level rolling, MAX A for LB dressing and MAX A for toileting bed level using lateral roll technique (able to contribute to anterior peri care, limited by pain with posterior).  Pt would benefit from skilled OT to address noted impairments and functional limitations (see below for any additional details) in order to maximize safety and independence while minimizing falls risk and caregiver burden. Upon hospital discharge, recommend STR to maximize pt safety and return to PLOF. Of note, pt satting in high 80s at rest at start of session on 4Lnc. OT increases to 5Lnc for  activity. Pt mostly tolerates well, but does drop to mid 80s with positional changes (only requires ~20-30secs to recover).     Follow Up Recommendations  SNF;Supervision - Intermittent    Equipment Recommendations  Other (comment) (defer to next level of care)    Recommendations for Other Services       Precautions / Restrictions Precautions Precautions: Fall Restrictions Weight Bearing Restrictions: Yes RLE Weight Bearing: Non weight bearing Other Position/Activity Restrictions: Per cardiology/Dr. [name] ok for patient to participate with PT as long as there is no chest pain or exertional dyspnea      Mobility Bed Mobility Overal bed mobility: Needs Assistance Bed Mobility: Rolling;Supine to Sit;Sit to Supine Rolling: Min assist   Supine to sit: Mod assist Sit to supine: Mod assist   General bed mobility comments: Pt somewhat limmited by R hip pain, but demos good understanding of written cues.  Transfers Overall transfer level: Needs assistance Equipment used: Rolling walker (2 wheeled) Transfers: Sit to/from Stand Sit to Stand: Min guard;Min assist         General transfer comment: MIN A +2 initiailly provided with RW, but pt ultimately only requiring MIN A +1, One tactile cue for safe hand placement, written cues throughout to guide pt through assessment. Pt requires tactile cue from PT to sustain NWB to R LE, but after one cue, demos good follow through    Balance Overall balance assessment: Needs assistance   Sitting balance-Leahy Scale: Fair Sitting balance - Comments: UE support   Standing balance support: Bilateral upper extremity supported Standing balance-Leahy Scale: Poor Standing balance comment: B UE support with RW. h/o falls  ADL either performed or assessed with clinical judgement   ADL Overall ADL's : Needs assistance/impaired                                       General ADL Comments: MIN  A with RW for ADL transfers with good adherance to NWB, MAX A at bed level for peri care (able to minimally contribute to anterior peri care), MAX A for LB dressing d/t precautions/pain. Setup for seated or bed level UB ADLs.     Vision Patient Visual Report: No change from baseline       Perception     Praxis      Pertinent Vitals/Pain Pain Assessment: Faces Faces Pain Scale: Hurts little more Pain Location: R hip with mobility (no pain at rest) Pain Descriptors / Indicators: Grimacing;Moaning Pain Intervention(s): Limited activity within patient's tolerance;Monitored during session;Premedicated before session     Hand Dominance Right   Extremity/Trunk Assessment Upper Extremity Assessment Upper Extremity Assessment: Overall WFL for tasks assessed;Generalized weakness (ROM WFL, shld, elbow and grip MMT grossly 4-/5)   Lower Extremity Assessment Lower Extremity Assessment: Defer to PT evaluation RLE: Unable to fully assess due to pain       Communication Communication Communication: Deaf;Other (comment) (mute, use whiteboard)   Cognition Arousal/Alertness: Awake/alert Behavior During Therapy: WFL for tasks assessed/performed Overall Cognitive Status: Within Functional Limits for tasks assessed                                 General Comments: pt communicates appropriate with whiteboard indicating yes/no with head shakes and good/bad with thumbs up/down. Would potentially benefit frmo comm board from ST if available.   General Comments       Exercises Other Exercises Other Exercises: OT facilitates ed re: role of OT in acute setting, importance of OOB activity, and PLB with visual cues/demo. Pt with good understanding. Other Exercises: PLB written and visual instruction Other Exercises: Seated therex including APs and LAQs   Shoulder Instructions      Home Living Family/patient expects to be discharged to:: Assisted living                              Home Equipment: Walker - 4 wheels;Bedside commode          Prior Functioning/Environment Level of Independence: Needs assistance  Gait / Transfers Assistance Needed: Mod Ind amb in ALF with a rollator, 2 falls in the last 6-9 months ADL's / Homemaking Assistance Needed: Ind with dressing and assist from staff for bathing   Comments: 2-3L home O2        OT Problem List: Decreased strength;Decreased range of motion;Decreased activity tolerance;Impaired balance (sitting and/or standing);Decreased safety awareness;Decreased knowledge of use of DME or AE;Cardiopulmonary status limiting activity      OT Treatment/Interventions: Self-care/ADL training;DME and/or AE instruction;Therapeutic activities;Balance training;Therapeutic exercise;Energy conservation;Patient/family education    OT Goals(Current goals can be found in the care plan section) Acute Rehab OT Goals Patient Stated Goal: none stated OT Goal Formulation: With patient Time For Goal Achievement: 02/06/20 Potential to Achieve Goals: Good ADL Goals Pt Will Perform Upper Body Dressing: with modified independence;sitting Pt Will Perform Lower Body Dressing: with min guard assist;sit to/from stand;with adaptive equipment Pt Will Transfer to Toilet: with min guard assist;stand  pivot transfer;bedside commode Pt Will Perform Toileting - Clothing Manipulation and hygiene: with min guard assist;sit to/from stand Pt/caregiver will Perform Home Exercise Program: Increased strength;Both right and left upper extremity;With minimal assist Additional ADL Goal #1: Pt will demo how to implement 1-2 EC strategies into an ADL task with less than 10% written cues  OT Frequency: Min 1X/week   Barriers to D/C:            Co-evaluation PT/OT/SLP Co-Evaluation/Treatment: Yes Reason for Co-Treatment: Complexity of the patient's impairments (multi-system involvement);For patient/therapist safety;To address functional/ADL  transfers PT goals addressed during session: Mobility/safety with mobility;Strengthening/ROM OT goals addressed during session: ADL's and self-care;Strengthening/ROM      AM-PAC OT "6 Clicks" Daily Activity     Outcome Measure Help from another person eating meals?: None Help from another person taking care of personal grooming?: A Little Help from another person toileting, which includes using toliet, bedpan, or urinal?: A Lot Help from another person bathing (including washing, rinsing, drying)?: A Lot Help from another person to put on and taking off regular upper body clothing?: A Little Help from another person to put on and taking off regular lower body clothing?: A Lot 6 Click Score: 16   End of Session Equipment Utilized During Treatment: Gait belt;Rolling walker Nurse Communication: Mobility status;Other (comment) (notified RN of dark tarry stool)  Activity Tolerance: Patient tolerated treatment well Patient left: in bed;with call bell/phone within reach;with bed alarm set  OT Visit Diagnosis: Unsteadiness on feet (R26.81);Muscle weakness (generalized) (M62.81);History of falling (Z91.81)                Time: 3893-7342 OT Time Calculation (min): 47 min Charges:  OT General Charges $OT Visit: 1 Visit OT Evaluation $OT Eval Moderate Complexity: 1 Mod OT Treatments $Self Care/Home Management : 8-22 mins  Rejeana Brock, MS, OTR/L ascom [phone] 01/23/20, 6:03 PM

## 2020-01-24 ENCOUNTER — Inpatient Hospital Stay: Payer: Medicare Other

## 2020-01-24 DIAGNOSIS — I248 Other forms of acute ischemic heart disease: Secondary | ICD-10-CM

## 2020-01-24 DIAGNOSIS — S72044A Nondisplaced fracture of base of neck of right femur, initial encounter for closed fracture: Secondary | ICD-10-CM

## 2020-01-24 DIAGNOSIS — H913 Deaf nonspeaking, not elsewhere classified: Secondary | ICD-10-CM

## 2020-01-24 DIAGNOSIS — R778 Other specified abnormalities of plasma proteins: Secondary | ICD-10-CM

## 2020-01-24 LAB — BASIC METABOLIC PANEL
Anion gap: 9 (ref 5–15)
BUN: 12 mg/dL (ref 8–23)
CO2: 28 mmol/L (ref 22–32)
Calcium: 8.8 mg/dL — ABNORMAL LOW (ref 8.9–10.3)
Chloride: 101 mmol/L (ref 98–111)
Creatinine, Ser: 0.51 mg/dL (ref 0.44–1.00)
GFR calc Af Amer: >60 mL/min (ref >60)
GFR calc non Af Amer: >60 mL/min (ref >60)
Glucose, Bld: 134 mg/dL — ABNORMAL HIGH (ref 70–99)
Potassium: 3.5 mmol/L (ref 3.5–5.1)
Sodium: 138 mmol/L (ref 135–145)

## 2020-01-24 LAB — BRAIN NATRIURETIC PEPTIDE: B Natriuretic Peptide: 797.4 pg/mL — ABNORMAL HIGH (ref 0.0–100.0)

## 2020-01-24 LAB — CBC
HCT: 28.7 % — ABNORMAL LOW (ref 36.0–46.0)
Hemoglobin: 9.4 g/dL — ABNORMAL LOW (ref 12.0–15.0)
MCH: 30.3 pg (ref 26.0–34.0)
MCHC: 32.8 g/dL (ref 30.0–36.0)
MCV: 92.6 fL (ref 80.0–100.0)
Platelets: 283 10*3/uL (ref 150–400)
RBC: 3.1 MIL/uL — ABNORMAL LOW (ref 3.87–5.11)
RDW: 14.3 % (ref 11.5–15.5)
WBC: 10 10*3/uL (ref 4.0–10.5)
nRBC: 0 % (ref 0.0–0.2)

## 2020-01-24 LAB — TROPONIN I (HIGH SENSITIVITY)
Troponin I (High Sensitivity): 1360 ng/L (ref <18)
Troponin I (High Sensitivity): 4566 ng/L (ref <18)

## 2020-01-24 MED ORDER — LISINOPRIL 5 MG PO TABS
5.0000 mg | ORAL_TABLET | Freq: Every day | ORAL | Status: DC
  Administered 2020-01-24 – 2020-01-25 (×2): 5 mg via ORAL
  Filled 2020-01-24 (×2): qty 1

## 2020-01-24 MED ORDER — ISOSORBIDE MONONITRATE ER 30 MG PO TB24
30.0000 mg | ORAL_TABLET | Freq: Every day | ORAL | Status: DC
  Administered 2020-01-24 – 2020-02-01 (×8): 30 mg via ORAL
  Filled 2020-01-24 (×9): qty 1

## 2020-01-24 MED ORDER — FUROSEMIDE 10 MG/ML IJ SOLN: 20.0000 mg | Freq: Once | INTRAMUSCULAR | Status: DC

## 2020-01-24 MED ORDER — TIZANIDINE HCL 4 MG PO TABS
4.0000 mg | ORAL_TABLET | Freq: Three times a day (TID) | ORAL | Status: DC | PRN
  Administered 2020-01-24 – 2020-02-01 (×2): 4 mg via ORAL
  Filled 2020-01-24 (×3): qty 1

## 2020-01-24 MED ORDER — FUROSEMIDE 10 MG/ML IJ SOLN
20.0000 mg | Freq: Two times a day (BID) | INTRAMUSCULAR | Status: DC
  Administered 2020-01-24 – 2020-01-25 (×3): 20 mg via INTRAVENOUS
  Filled 2020-01-24 (×4): qty 2

## 2020-01-24 MED ORDER — METHYLPREDNISOLONE SODIUM SUCC 40 MG IJ SOLR
40.0000 mg | Freq: Every day | INTRAMUSCULAR | Status: DC
  Administered 2020-01-25: 40 mg via INTRAVENOUS
  Filled 2020-01-24: qty 1

## 2020-01-24 NOTE — Progress Notes (Signed)
PROGRESS NOTE    Patient: Alice Wallace                            PCP: Devoria Glassing, NP                    DOB: 08-07-1940            DOA: 01/21/2020 QIO:[id number]             DOS: 01/24/2020, 11:14 AM   LOS: 3 days   Date of Service: The patient was seen and examined on 01/24/2020  Subjective:   The patient was seen and examined this morning, stable no acute distress with exception of complaining with chest pain. Patient was pointing to her chest this morning complained of chest pain... Worsening with deep breathing Nevertheless EKG was obtained,, chest x-ray, troponin   Patient was given aspirin Plavix and nitroglycerin, pain improved.  Cardiology was notified-EKG was reviewed, no concern for acute ST elevation MI Agreement with current medical care.  Hemodynamically stable    Brief Narrative:   Alice Wallace  is a 79 y.o. Caucasian female with a known history of COPD, hypertension, deafness and muteness, presented to the emergency room with acute onset of fall at her assisted living facility with subsequent right hip pain and significant respiratory distress.  Her pulse currently was down to the 70s on her 2 L O2 by nasal cannula with associated coarse breath sounds and wheezing.  She was subsequently increased to 6 L, then 15 L via nonrebreather mask, to maintain O2 sat greater 92%.   She was thought to be in COPD exacerbation was given IV Solu-Medrol and nebulized albuterol as well as duo nebs by EMS on route to the hospital. She can read lips and written communications.  ED  RR 23, HR 106, 93-96% on 15 L of O2 via NRM   BNP of 191.6, high-sensitivity troponin I of 245 mL 1281 and CBC showed leukocytosis of 10.8 as well as anemia.  COVID-19 PCR came back negative.  EKG showed mild ectopic atrial tachycardia with  incomplete RBB, LVH with T wave inversion laterally.  Treated with aspirin, IV heparin, nebs, IV Versed, morphine Chest CTA that ruled out PE and showed right  lower lobe atelectasis CT scan since suspicious for nondisplaced stress fracture of the proximal right femoral diaphysis with consideration for MRI.  [name] was notified about the patient.     Assessment & Plan:   Active Problems:   Non-STEMI (non-ST elevated myocardial infarction) (HCC)   Acute respiratory failure with hypoxia (HCC)   COPD exacerbation (HCC)   Acute on chronic respiratory failure with COPD exacerbation /bronchitis, cannot rule out pneumonia Remains to have shortness of breath, O2 demand and continues to decline  -Continue to improve, patient has been weaned off 15 L to 6 L, now on 4 L >>>3 L of oxygen, satting 93% -Goal is to taper the patient off oxygen or to maintain O2 greater 92% -Continue DuoNeb bronchodilators scheduled and as needed -We will continue IV steroids--we will start tapering it down today  -We will continue with antibiotics IV Rocephin, azithromycin >>> cultures negative, modifying antibiotics to p.o. total of 5 days -We will hold off Advair Diskus.  -Status post CT angiogram chest -negative for pulmonary embolism  CT angiogram IMPRESSION: 1. Right lower lobe atelectasis versus pneumonia. 2. Moderately motion degraded chest CTA. No evidence of pulmonary embolism. 3. Advanced  emphysema. Aortic Atherosclerosis   ?  History of CHF diastolic failure.. Echo reviewed no signs of overt diastolic failure, ejection fraction preserved 60-65% -Monitoring daily weight -Elevated BNP 797.4, no lower extremity edema noted congestion on chest x-ray -For no clear reason patient is on Zaroxolyn and Lasix at home will be resumed -Appreciate cardiology input -No signs of edema or fluid retention -Additional dose of IV Lasix will be given today 20 mg   Suspected non-STEMI. -Complaining of chest pain this morning Dr. [name] was following >>> cardiology was renotified As patient was having chest pain,--repeat EKG, troponin and chest x-ray was  obtained Cardiology recommended no further intervention, agreed with medical management Patient has received aspirin, Plavix, statins, nitroglycerin this morning.   -Elevated troponin ... Troponin 2,276, this morning 1360 --EKG reviewed: Normal sinus, ST abnormality noted on lead I but no other leads, also to be reviewed by cardiology  -The patient will be continued on IV heparin.. Discontinued 9-21 -On aspirin, statins, as needed sublingual nitroglycerin and morphine -2D echo reviewed ejection fraction 60-65% negative for any regional or global abnormality -Cardiology following closely ... Not recommending intervention at this time, medical management    Hypertension. -Monitor very closely, continue home medication Cardizem CD -Patient is currently receiving as needed nitroglycerin -Remained stable   Proximal right femoral diaphysis fracture -Ortho consulted -stating that fracture is suspicious of bisphosphonate related side effect recommending discontinuing alendronate Ortho recommending nonweightbearing on right hip, close follow-up in clinic Stating will likely need prophylactic femoral nailing -Ortho recommending no intervention -PT/OT once patient stable -Remained stable, no surgical invention  Depression -We will continue home medication of Lexapro, Remained stable  Vitamin D deficiency Continue vitamin D supplements   GERD Continue Protonix -Stable  Hypokalemia Monitoring repleting accordingly   Patient is deaf, and mute -Able to communicate by gestures adequately And writing  Nutritional status:          Consultants:  Cardiology/orthopedic   --------------------------------------------------------------------------------------------------------------------------------------------- DVT prophylaxis: Heparin subcu (Heparin Gtt discontinued) Code Status:   Code Status: Full Code Family Communication: No family member present at bedside- attempt will be  made to update daily The above findings and plan of care has been discussed with patient (and family )  in detail,  they expressed understanding and agreement of above. -Advance care planning has been discussed.   Admission status:    Status is: Inpatient  Remains inpatient appropriate because:Inpatient level of care appropriate due to severity of illness   Dispo: The patient is from:  assisted living              Anticipated d/c is to: SNF              Anticipated d/c date is:  SNF in 1-2 days               Patient currently is not medically stable to d/c.        Procedures:   No admission procedures for hospital encounter.   Antimicrobials:  Anti-infectives (From admission, onward)   Start     Dose/Rate Route Frequency Ordered Stop   01/24/20 1000  levofloxacin (LEVAQUIN) tablet 500 mg        500 mg Oral Daily 01/23/20 1126 01/26/20 0959   01/21/20 1530  ceFAZolin (ANCEF) IVPB 2g/100 mL premix  Status:  Discontinued        2 g 200 mL/hr over 30 Minutes Intravenous  Once 01/21/20 0738 01/21/20 1009   01/21/20 0730  azithromycin (ZITHROMAX) 500  mg in sodium chloride 0.9 % 250 mL IVPB  Status:  Discontinued        500 mg 250 mL/hr over 60 Minutes Intravenous Every 24 hours 01/21/20 0615 01/23/20 1123   01/21/20 0700  cefTRIAXone (ROCEPHIN) 1 g in sodium chloride 0.9 % 100 mL IVPB  Status:  Discontinued        1 g 200 mL/hr over 30 Minutes Intravenous Every 24 hours 01/21/20 0615 01/23/20 1123       Medication:  . alum & mag hydroxide-simeth  30 mL Oral TID AC & HS  . aspirin EC  81 mg Oral Daily  . atorvastatin  80 mg Oral Daily  . calcium-vitamin D  1 tablet Oral BID  . Chlorhexidine Gluconate Cloth  6 each Topical Q0600  . cholecalciferol  5,000 Units Oral Daily  . clopidogrel  75 mg Oral Daily  . diltiazem  240 mg Oral Daily  . enoxaparin (LOVENOX) injection  40 mg Subcutaneous Q24H  . escitalopram  10 mg Oral Daily  . feeding supplement (ENSURE ENLIVE)  237  mL Oral TID BM  . ferrous sulfate  325 mg Oral BID WC  . furosemide  20 mg Intravenous BID  . furosemide  20 mg Oral Daily  . guaiFENesin  600 mg Oral Daily  . ipratropium-albuterol  3 mL Nebulization QID  . levofloxacin  500 mg Oral Daily  . loratadine  10 mg Oral Daily  . [START ON 01/25/2020] methylPREDNISolone (SOLU-MEDROL) injection  40 mg Intravenous Daily  . metolazone  2.5 mg Oral BID  . mupirocin ointment  1 application Nasal BID  . pantoprazole  40 mg Oral Daily  . potassium chloride SA  40 mEq Oral Daily    acetaminophen **OR** acetaminophen, albuterol, dextromethorphan, diphenhydrAMINE, loperamide, magnesium hydroxide, morphine injection, nitroGLYCERIN, ondansetron **OR** ondansetron (ZOFRAN) IV, tiZANidine, traZODone   Objective:   Vitals:   01/24/20 0538 01/24/20 0848 01/24/20 0919 01/24/20 1107  BP: (!) 162/74 (!) 164/84 (!) 161/77   Pulse: 78  90   Resp: 16     Temp: 97.8 F (36.6 C) 98.5 F (36.9 C) 98.1 F (36.7 C)   TempSrc:  Oral Oral   SpO2: 98% 98% 98% 97%  Weight: 68.5 kg     Height:        Intake/Output Summary (Last 24 hours) at 01/24/2020 1114 Last data filed at 01/24/2020 0525 Gross per 24 hour  Intake 600 ml  Output 2925 ml  Net -2325 ml   Filed Weights   01/21/20 0259 01/22/20 0547 01/24/20 0538  Weight: 55.9 kg 56.2 kg 68.5 kg     Examination:         Physical Exam:   General:  Alert, oriented, cooperative, no distress;   HEENT:  Normocephalic, PERRL, otherwise with in Normal limits   Neuro:  CNII-XII intact. , normal motor and sensation, reflexes intact   Lungs:   Clear to auscultation BL, Respirations unlabored, no wheezes / crackles  Cardio:    S1/S2, RRR, No murmure, No Rubs or Gallops   Abdomen:   Soft, non-tender, bowel sounds active all four quadrants,  no guarding or peritoneal signs.  Muscular skeletal:  Limited exam - in bed, able to move all 4 extremities, Normal strength,  2+ pulses,  symmetric, No pitting edema   Skin:  Dry, warm to touch, negative for any Rashes, No open wounds  Wounds: Please see nursing documentation Pressure Injury 05/27/19 Ischial tuberosity Left Unstageable - Full thickness tissue  loss in which the base of the injury is covered by slough (yellow, tan, gray, green or brown) and/or eschar (tan, brown or black) in the wound bed. (Active)  05/27/19 1630  Location: Ischial tuberosity  Location Orientation: Left  Staging: Unstageable - Full thickness tissue loss in which the base of the injury is covered by slough (yellow, tan, gray, green or brown) and/or eschar (tan, brown or black) in the wound bed.  Wound Description (Comments):   Present on Admission: Yes                 ------------------------------------------------------------------------------------------------------------------------------------------    LABs:  CBC Latest Ref Rng & Units 01/24/2020 01/23/2020 01/22/2020  WBC 4.0 - 10.5 K/uL 10.0 10.3 12.0(H)  Hemoglobin 12.0 - 15.0 g/dL 1.6(X9.4(L) 0.9(U8.7(L) 0.4(V8.0(L)  Hematocrit 36 - 46 % 28.7(L) 26.0(L) 24.1(L)  Platelets 150 - 400 K/uL 283 259 246   CMP Latest Ref Rng & Units 01/24/2020 01/23/2020 01/22/2020  Glucose 70 - 99 mg/dL 409(W134(H) 119(J138(H) 478(G124(H)  BUN 8 - 23 mg/dL 12 14 17   Creatinine 0.44 - 1.00 mg/dL 9.560.51 2.130.53 0.860.58  Sodium 135 - 145 mmol/L 138 138 136  Potassium 3.5 - 5.1 mmol/L 3.5 3.6 4.2  Chloride 98 - 111 mmol/L 101 104 102  CO2 22 - 32 mmol/L 28 25 24   Calcium 8.9 - 10.3 mg/dL 5.7(Q8.8(L) 4.6(N8.6(L) 6.2(X8.5(L)  Total Protein 6.5 - 8.1 g/dL - - 5.4(L)  Total Bilirubin 0.3 - 1.2 mg/dL - - 0.6  Alkaline Phos 38 - 126 U/L - - 53  AST 15 - 41 U/L - - 37  ALT 0 - 44 U/L - - 30       Micro Results Recent Results (from the past 240 hour(s))  SARS Coronavirus 2 by RT PCR (hospital order, [facility]) Nasopharyngeal Nasopharyngeal Swab     Status: None   Collection Time: 01/21/20  2:53 AM   Specimen: Nasopharyngeal Swab  Result Value Ref Range  Status   SARS Coronavirus 2 NEGATIVE NEGATIVE Final    Comment: (NOTE) SARS-CoV-2 target nucleic acids are NOT DETECTED.  The SARS-CoV-2 RNA is generally detectable in upper and lower respiratory specimens during the acute phase of infection. The lowest concentration of SARS-CoV-2 viral copies this assay can detect is 250 copies / mL. A negative result does not preclude SARS-CoV-2 infection and should not be used as the sole basis for treatment or other patient management decisions.  A negative result may occur with improper specimen collection / handling, submission of specimen other than nasopharyngeal swab, presence of viral mutation(s) within the areas targeted by this assay, and inadequate number of viral copies (<250 copies / mL). A negative result must be combined with clinical observations, patient history, and epidemiological information.  Fact Sheet for Patients:   BoilerBrush.com.cyhttps://www.fda.gov/media/136312/download  Fact Sheet for Healthcare Providers: https://pope.com/https://www.fda.gov/media/136313/download  This test is not yet approved or  cleared by the [place] FDA and has been authorized for detection and/or diagnosis of SARS-CoV-2 by FDA under an Emergency Use Authorization (EUA).  This EUA will remain in effect (meaning this test can be used) for the duration of the COVID-19 declaration under Section 564(b)(1) of the Act, 21 U.S.C. section 360bbb-3(b)(1), unless the authorization is terminated or revoked sooner.  Performed at [facility], [address] [place], [place] [address]   MRSA PCR Screening     Status: Abnormal   Collection Time: 01/22/20  5:15 PM   Specimen: Nasopharyngeal  Result Value  Ref Range Status   MRSA by PCR POSITIVE (A) NEGATIVE Final    Comment:        The GeneXpert MRSA Assay (FDA approved for NASAL specimens only), is one component of a comprehensive MRSA colonization surveillance program. It is not intended to diagnose MRSA infection  nor to guide or monitor treatment for MRSA infections. RESULT CALLED TO, READ BACK BY AND VERIFIED WITH: BEBRA EMBDEN 01/22/20 AT 2211 BY ACR Performed at [facility], [address] [place], [place] [address]     Radiology Reports CT Angio Chest PE W/Cm &/Or Wo Cm  Result Date: 01/21/2020 CLINICAL DATA:  Fall and shortness of breath. EXAM: CT ANGIOGRAPHY CHEST WITH CONTRAST TECHNIQUE: Multidetector CT imaging of the chest was performed using the standard protocol during bolus administration of intravenous contrast. Multiplanar CT image reconstructions and MIPs were obtained to evaluate the vascular anatomy. CONTRAST:  75mL OMNIPAQUE IOHEXOL 350 MG/ML SOLN COMPARISON:  06/09/2019 FINDINGS: Cardiovascular: Prominent right ventricular size. No pericardial effusion. Aortic and great vessel atherosclerotic calcification. No pulmonary artery filling defect is seen. There is significant motion artifact at the lung bases. Mediastinum/Nodes: Small to moderate sliding hiatal hernia. No adenopathy. Lungs/Pleura: Opacity in the right lower lobe with volume loss. No edema, effusion, or pneumothorax. Panlobular emphysema. Dystrophic calcifications in the apical lungs. Bronchomalacia. Upper Abdomen: Atherosclerotic calcification. Musculoskeletal: Healing anterior and lateral left rib fractures. Review of the MIP images confirms the above findings. IMPRESSION: 1. Right lower lobe atelectasis versus pneumonia. 2. Moderately motion degraded chest CTA. No evidence of pulmonary embolism. 3. Advanced emphysema. Aortic Atherosclerosis (ICD10-I70.0) and Emphysema (ICD10-J43.9). Electronically Signed   By: Marnee Spring M.D.   On: 01/21/2020 05:55   CT Hip Right Wo Contrast  Result Date: 01/21/2020 CLINICAL DATA:  Hip trauma.  Fracture suspected. EXAM: CT OF THE RIGHT HIP WITHOUT CONTRAST TECHNIQUE: Multidetector CT imaging of the right hip was performed according to the standard protocol. Multiplanar CT image  reconstructions were also generated. COMPARISON:  Hip x-rays from earlier same day. FINDINGS: Bones/Joint/Cartilage Linear lucency identified in the lateral cortex of the proximal right femoral diaphysis associated with focal cortical thickening. Imaging features compatible with nondisplaced stress fracture. The abnormality is included on the extreme inferior aspect of this study and is best visualized on sagittal reconstruction image 21 of series 5. Degenerative changes are noted in the right hip. Ligaments Suboptimally assessed by CT. Muscles and Tendons No substantial hemorrhage noted in the proximal thigh musculature. Soft tissues Distended urinary bladder is incompletely visualized. IMPRESSION: Linear lucency in the lateral cortex of the proximal right femoral diaphysis associated with focal cortical thickening. Imaging features compatible with nondisplaced stress fracture. Consider MRI of the proximal right femur for definitive characterization. Electronically Signed   By: Kennith Center M.D.   On: 01/21/2020 06:02   DG Chest Port 1 View  Result Date: 01/24/2020 CLINICAL DATA:  COPD exacerbation. Pt presented 01/21/2020 to the emergency room with acute onset of fall at her assisted living facility with subsequent right hip pain and significant respiratory distress. Hx - COPD, HTN, non-smoker. EXAM: PORTABLE CHEST 1 VIEW COMPARISON:  01/21/2020 FINDINGS: Changes of emphysema skeletal lung scarring and interstitial thickening are stable. Opacity at the right lung base has mildly improved from the previous study. No new lung abnormalities. No convincing pleural effusion and no pneumothorax. Cardiac silhouette is normal in size. Multiple bilateral old rib fractures are stable. IMPRESSION: 1. Mild interval improvement. Decreased opacity at the right lung base consistent with  improved pneumonia or atelectasis. 2. No other change. Stable chronic findings including extensive emphysema. Electronically Signed   By:  Amie Portland M.D.   On: 01/24/2020 10:43   DG Chest Portable 1 View  Result Date: 01/21/2020 CLINICAL DATA:  Larey Seat, short of breath, hypoxia EXAM: PORTABLE CHEST 1 VIEW COMPARISON:  06/07/2019 FINDINGS: Single frontal view of the chest demonstrates a stable cardiac silhouette. Extensive bullous emphysematous changes are seen, greatest at the right apex. Marked areas of scarring and fibrosis are seen throughout the lungs. Increased density at the right costophrenic angle may reflect consolidation and/or effusion. Chronic posttraumatic changes of the thoracic cage. No acute displaced fractures. IMPRESSION: 1. 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 2. Density at the right lateral lung base may reflect focal lung consolidation and/or small effusion. Electronically Signed   By: Sharlet Salina M.D.   On: 01/21/2020 03:48   ECHOCARDIOGRAM COMPLETE  Result Date: 01/21/2020    ECHOCARDIOGRAM REPORT   Patient Name:   [facility] Date of Exam: 01/21/2020 Medical Rec #:  [id number]     Height:       66.0 in Accession #:    [id number]    Weight:       123.2 lb Date of Birth:  1940/07/25      BSA:          1.628 m Patient Age:    79 years      BP:           116/63 mmHg Patient Gender: F             HR:           74 bpm. Exam Location:  [facility] Procedure: 2D Echo, Cardiac Doppler and Color Doppler Indications:     NSTEMI 121.4  History:         Patient has no prior history of Echocardiogram examinations.                  COPD; Risk Factors:Hypertension.  Sonographer:     Cristela Blue RDCS (AE) Referring Phys:  [id number] Vernetta Honey MANSY Diagnosing Phys: Debbe Odea MD IMPRESSIONS  1. Left ventricular ejection fraction, by estimation, is 60 to 65%. The left ventricle has normal function. The left ventricle has no regional wall motion abnormalities. Left ventricular diastolic parameters are consistent with Grade I diastolic dysfunction (impaired relaxation).  2. Right ventricular systolic function is  normal. The right ventricular size is normal.  3. Left atrial size was moderately dilated.  4. The mitral valve is normal in structure. No evidence of mitral valve regurgitation. No evidence of mitral stenosis.  5. The aortic valve is normal in structure. Aortic valve regurgitation is not visualized. No aortic stenosis is present.  6. The inferior vena cava is normal in size with greater than 50% respiratory variability, suggesting right atrial pressure of 3 mmHg. FINDINGS  Left Ventricle: Left ventricular ejection fraction, by estimation, is 60 to 65%. The left ventricle has normal function. The left ventricle has no regional wall motion abnormalities. The left ventricular internal cavity size was normal in size. There is  no left ventricular hypertrophy. Left ventricular diastolic parameters are consistent with Grade I diastolic dysfunction (impaired relaxation). Right Ventricle: The right ventricular size is normal. No increase in right ventricular wall thickness. Right ventricular systolic function is normal. Left Atrium: Left atrial size was moderately dilated. Right Atrium: Right atrial size was normal in size. Pericardium: There is no evidence of  pericardial effusion. Mitral Valve: The mitral valve is normal in structure. Normal mobility of the mitral valve leaflets. No evidence of mitral valve regurgitation. No evidence of mitral valve stenosis. Tricuspid Valve: The tricuspid valve is normal in structure. Tricuspid valve regurgitation is not demonstrated. No evidence of tricuspid stenosis. Aortic Valve: The aortic valve is normal in structure. Aortic valve regurgitation is not visualized. No aortic stenosis is present. Aortic valve mean gradient measures 3.5 mmHg. Aortic valve peak gradient measures 6.5 mmHg. Aortic valve area, by VTI measures 2.46 cm. Pulmonic Valve: The pulmonic valve was not well visualized. Pulmonic valve regurgitation is not visualized. No evidence of pulmonic stenosis. Aorta: The  aortic root is normal in size and structure. Venous: The inferior vena cava was not well visualized. The inferior vena cava is normal in size with greater than 50% respiratory variability, suggesting right atrial pressure of 3 mmHg. IAS/Shunts: No atrial level shunt detected by color flow Doppler.  LEFT VENTRICLE PLAX 2D LVIDd:         4.77 cm  Diastology LVIDs:         3.08 cm  LV e' lateral:   7.83 cm/s LV PW:         1.15 cm  LV E/e' lateral: 11.6 LV IVS:        1.11 cm  LV e' medial:    5.87 cm/s LVOT diam:     2.00 cm  LV E/e' medial:  15.5 LV SV:         68 LV SV Index:   42 LVOT Area:     3.14 cm  RIGHT VENTRICLE RV Basal diam:  3.63 cm RV S prime:     12.40 cm/s TAPSE (M-mode): 3.6 cm LEFT ATRIUM             Index       RIGHT ATRIUM           Index LA diam:        4.80 cm 2.95 cm/m  RA Area:     15.10 cm LA Vol (A2C):   83.7 ml 51.42 ml/m RA Volume:   35.80 ml  21.99 ml/m LA Vol (A4C):   96.5 ml 59.28 ml/m LA Biplane Vol: 91.0 ml 55.90 ml/m  AORTIC VALVE                   PULMONIC VALVE AV Area (Vmax):    2.25 cm    PV Vmax:        0.92 m/s AV Area (Vmean):   2.51 cm    PV Peak grad:   3.4 mmHg AV Area (VTI):     2.46 cm    RVOT Peak grad: 5 mmHg AV Vmax:           127.00 cm/s AV Vmean:          86.450 cm/s AV VTI:            0.277 m AV Peak Grad:      6.5 mmHg AV Mean Grad:      3.5 mmHg LVOT Vmax:         90.90 cm/s LVOT Vmean:        69.000 cm/s LVOT VTI:          0.217 m LVOT/AV VTI ratio: 0.78  AORTA Ao Root diam: 3.50 cm MITRAL VALVE                TRICUSPID VALVE MV Area (PHT): 3.34 cm  TR Peak grad:   30.7 mmHg MV Decel Time: 227 msec     TR Vmax:        277.00 cm/s MV E velocity: 91.20 cm/s MV A velocity: 137.00 cm/s  SHUNTS MV E/A ratio:  0.67         Systemic VTI:  0.22 m                             Systemic Diam: 2.00 cm Debbe Odea MD Electronically signed by Debbe Odea MD Signature Date/Time: 01/21/2020/4:26:00 PM    Final    DG Hip Unilat W or Wo Pelvis 2-3 Views  Right  Result Date: 01/21/2020 CLINICAL DATA:  Larey Seat, pain EXAM: DG HIP (WITH OR WITHOUT PELVIS) 2-3V RIGHT COMPARISON:  01/31/2017 FINDINGS: Frontal view of the pelvis as well as frontal and cross-table lateral views of the right hip are obtained. There is a nondisplaced transverse fracture through the proximal femoral diaphysis. Alignment is anatomic. There are no other acute displaced fractures. The hips are well aligned. Mild bilateral hip osteoarthritis. Sacroiliac joints are normal. IMPRESSION: 1. Nondisplaced transverse fracture through the proximal right femoral diaphysis. Fracture line involves an area of chronic focal cortical thickening, and likely reflects an acute fracture at a site of chronic stress. Electronically Signed   By: Sharlet Salina M.D.   On: 01/21/2020 03:46    SIGNED: Kendell Bane, MD, FACP, FHM. [facility],  Pager (please use [email or web address] to page/text)  If 7PM-7AM, please contact night-coverage [email or web address], Password [facility] 01/24/2020, 11:14 AM

## 2020-01-24 NOTE — Progress Notes (Signed)
[facility]    SUBJECTIVE: The patient reportedly had chest pressure this morning, received aspirin and nitroglycerin. At this time, she reports that her chest pain is much better, rating at a 1/10 in intensity.   Vitals:   01/23/20 1935 01/24/20 0538 01/24/20 0848 01/24/20 0919  BP: (!) 152/65 (!) 162/74 (!) 164/84 (!) 161/77  Pulse: 96 78  90  Resp: 18 16    Temp: 98.3 F (36.8 C) 97.8 F (36.6 C) 98.5 F (36.9 C) 98.1 F (36.7 C)  TempSrc:   Oral Oral  SpO2: 96% 98% 98% 98%  Weight:  68.5 kg    Height:         Intake/Output Summary (Last 24 hours) at 01/24/2020 1002 Last data filed at 01/24/2020 7106 Gross per 24 hour  Intake 600 ml  Output 2925 ml  Net -2325 ml      PHYSICAL EXAM  General: Frail elderly female sitting up in bed watching TV and eating snack, in no acute distress.  HEENT:  Normocephalic and atraumatic. Deaf Neck:  No JVD.  Lungs: diminished breath sounds throughout, normal effort of breathing on supplemental oxygen. Coughing Heart: HRRR . Normal S1 and S2 without gallops or murmurs.  Abdomen: nondistended Msk:  Back normal, gait not assessed. Normal strength and tone for age. Extremities: No clubbing, cyanosis or edema.   Neuro: Alert and oriented X 3.    LABS: Basic Metabolic Panel: Recent Labs    01/23/20 0347 01/24/20 0427  NA 138 138  K 3.6 3.5  CL 104 101  CO2 25 28  GLUCOSE 138* 134*  BUN 14 12  CREATININE 0.53 0.51  CALCIUM 8.6* 8.8*   Liver Function Tests: Recent Labs    01/22/20 1556  AST 37  ALT 30  ALKPHOS 53  BILITOT 0.6  PROT 5.4*  ALBUMIN 3.1*   No results for input(s): LIPASE, AMYLASE in the last 72 hours. CBC: Recent Labs    01/23/20 0347 01/24/20 0427  WBC 10.3 10.0  HGB 8.7* 9.4*  HCT 26.0* 28.7*  MCV 92.2 92.6  PLT 259 283   Cardiac Enzymes: No results for input(s): CKTOTAL, CKMB, CKMBINDEX, TROPONINI in the last 72 hours. BNP: Invalid input(s): POCBNP D-Dimer: No results for input(s):  DDIMER in the last 72 hours. Hemoglobin A1C: No results for input(s): HGBA1C in the last 72 hours. Fasting Lipid Panel: No results for input(s): CHOL, HDL, LDLCALC, TRIG, CHOLHDL, LDLDIRECT in the last 72 hours. Thyroid Function Tests: No results for input(s): TSH, T4TOTAL, T3FREE, THYROIDAB in the last 72 hours.  Invalid input(s): FREET3 Anemia Panel: No results for input(s): VITAMINB12, FOLATE, FERRITIN, TIBC, IRON, RETICCTPCT in the last 72 hours.  No results found.   Echo LVEF 60%, no wall motion abnormalities  TELEMETRY: sinus rhythm  ASSESSMENT AND PLAN:  Active Problems:   Acute respiratory failure with hypoxia (HCC)   COPD exacerbation (HCC)   Non-STEMI (non-ST elevated myocardial infarction) (HCC)    1. NSTEMI, treated medically, heparin discontinued yesterday with recurrent chest pain this morning. Repeat ECG shows ST elevation in lead 1 without meeting STEMI criteria, and troponin trending down (245->1281->2276->1360). Patient reports feeling better with nitroglycerin. 2D echocardiogram revealed normal left ventricular function without wall motion abnormalities.  2. Chronic diastolic CHF, BNP elevated to 269 this morning, chest xray negative for acute abnormality. Denies shortness of breath at rest, no lower extremity edema or JVD. On oral Lasix, switched to IV Lasix 20 mg today.  3. Acute on chronic respiratory  failure with COPD exacerbation and possible pneumonia, on supplemental O2 which has been weaned to 3L, IV steroids, and antibiotics  4. Hypertension, elevated this morning, on diltiazem, Lasix, metolazone,   Recommendations: 1. Continue medical management of NSTEMI with aspirin, Plavix, and high intensity statin 2. Add isosorbide mononitrate 30 mg daily 3. Agree with switching to IV Lasix 20 mg daily, continuing metolazone, with careful monitoring of renal status, electrolytes, and I&Os 4. Recommend adding lisinopril 5 mg daily    Alice Wallace,  [place] 01/24/2020 10:02 AM

## 2020-01-24 NOTE — Progress Notes (Signed)
Chest pain resolve, patient resting in the bed, no distress noted at this  time

## 2020-01-24 NOTE — Progress Notes (Signed)
PT Cancellation Note  Patient Details Name: Alice Wallace MRN: [id number] DOB: 05-27-1940   Cancelled Treatment:     PT attempt. Communicated with pt with white  board. Pt is currently c/o chest pain. MD entered room and ordered EKG and new medication. PT will return later this date If/when symptoms improve. Sao2 94% on 6 L [place], BP elevated at 165/84, HR 88 bpm. Pt does present with congestion + cough.    Rushie Chestnut 01/24/2020, 9:15 AM

## 2020-01-24 NOTE — Progress Notes (Addendum)
   01/24/20 0919  Vitals  Temp 98.1 F (36.7 C)  Temp Source Oral  BP (!) 161/77  MAP (mmHg) 103  BP Method Automatic  Pulse Rate 90  Pulse Rate Source Monitor   Notified by MD that patient having chest pain, asymptomatic, nitro given time one, chest x ray and EKG  Done will continue to monitor ,

## 2020-01-24 NOTE — Plan of Care (Signed)
Patient presently OOB to chair with pt assist,  alert denies any chest pain at this time , mood calm  Will continue to monitor    Problem: Education: Goal: Knowledge of General Education information will improve Description: Including pain rating scale, medication(s)/side effects and non-pharmacologic comfort measures Outcome: Progressing   Problem: Health Behavior/Discharge Planning: Goal: Ability to manage health-related needs will improve Outcome: Progressing   Problem: Clinical Measurements: Goal: Ability to maintain clinical measurements within normal limits will improve Outcome: Progressing

## 2020-01-24 NOTE — Progress Notes (Signed)
Physical Therapy Treatment Patient Details Name: Alice Wallace MRN: [id number] DOB: 10/07/1940 Today's Date: 01/24/2020    History of Present Illness Per MD notes: Pt is a 79 y.o. caucasian female with a known history of COPD, hypertension, deafness and muteness, presented to the emergency room with acute onset of fall at her assisted living facility with subsequent right hip pain and significant respiratory distress.  She was thought to be in COPD exacerbation was given IV Solu-Medrol and nebulized albuterol as well as duo nebs by EMS on route to the hospital.  Pt found to have a proximal right femoral diaphysis fracture with no surgical intervention planned at this time.  MD assessment also includes: Acute on chronic respiratory failure with COPD exacerbation /bronchitis, cannot rule out pneumonia, h/o CHF, suspected NSTEMI, HTN, hypokalemia, and depression.    PT Comments    Pt was long sitting in bed upon arriving. Able to communicate with head nods and thumbs up/down. Seems to appropriately respond to all questions. Had episode of chest pain earlier in day. Now states its resolved with only 1/10 chest pain. 7/10 pain in R hip. She was aware of NWB restriction and is able to perform throughout session. Pt able to stand EOB 2 x prior to stand pivot to recliner. Min/mod assist for transfers. Overall tolerated session well. Issued HEP and pt performed actively. She was sitting in recliner post session with call bell in reach and RN aware of her abilities. Recommend stand pivot towards left for safety when returning to bed or during all transfers OOB. Will benefit from SNF at DC to address deficits and improve safe functional mobility.     Follow Up Recommendations  SNF     Equipment Recommendations  Other (comment) (Defer to next level of care)    Recommendations for Other Services       Precautions / Restrictions Precautions Precautions: Fall Restrictions Weight Bearing Restrictions:  Yes RLE Weight Bearing: Non weight bearing    Mobility  Bed Mobility Overal bed mobility: Needs Assistance Bed Mobility: Rolling;Supine to Sit;Sit to Supine Rolling: Min assist   Supine to sit: Mod assist     General bed mobility comments: Increased time due to pain.Vcs throughout for improved technique.   Transfers Overall transfer level: Needs assistance Equipment used: Rolling walker (2 wheeled) Transfers: Sit to/from [employer] Sit to Stand: Min assist Stand pivot transfers: Min assist;Mod assist       General transfer comment: Min assist to stand 2 x EOB while maintaining NWB. then performed stand pivot towards L with increased time. pt does great job with maintaining proper wt bearing.   Ambulation/Gait             General Gait Details: unsafe       Balance Overall balance assessment: Needs assistance Sitting-balance support: Single extremity supported;Feet unsupported Sitting balance-Leahy Scale: Good Sitting balance - Comments: no LOB in sitting    Standing balance support: Bilateral upper extremity supported Standing balance-Leahy Scale: Fair Standing balance comment: Pt was able to stand holding RW x ~ 1 minute with CGA oinly in static. min-mod in dynamic (transfer to chai)                            Cognition Arousal/Alertness: Awake/alert Behavior During Therapy: WFL for tasks assessed/performed Overall Cognitive Status: Within Functional Limits for tasks assessed  General Comments: Pt is able to respond appropriately with thumbs up/down and head nodding. Used white board for more complexed questioning. pt is unable to write but seems alert and oriented.      Exercises General Exercises - Lower Extremity Ankle Circles/Pumps: AROM Quad Sets: AROM Gluteal Sets: AROM Hip ABduction/ADduction: AROM Straight Leg Raises: AAROM (due to pain)    General Comments         Pertinent Vitals/Pain Pain Assessment: 0-10 Pain Score: 7  (1/10 chest pain this afternoon) Faces Pain Scale: Hurts little more Pain Location: R hip with mobility (no pain at rest) Pain Descriptors / Indicators: Grimacing;Moaning Pain Intervention(s): Limited activity within patient's tolerance;Monitored during session;Premedicated before session;Repositioned           PT Goals (current goals can now be found in the care plan section) Acute Rehab PT Goals Patient Stated Goal: non verbal Progress towards PT goals: Progressing toward goals    Frequency    7X/week      PT Plan Current plan remains appropriate    Co-evaluation     PT goals addressed during session: Mobility/safety with mobility;Strengthening/ROM        AM-PAC PT "6 Clicks" Mobility   Outcome Measure  Help needed turning from your back to your side while in a flat bed without using bedrails?: A Lot Help needed moving from lying on your back to sitting on the side of a flat bed without using bedrails?: A Lot Help needed moving to and from a bed to a chair (including a wheelchair)?: A Lot Help needed standing up from a chair using your arms (e.g., wheelchair or bedside chair)?: A Little Help needed to walk in hospital room?: Total Help needed climbing 3-5 steps with a railing? : Total 6 Click Score: 11    End of Session Equipment Utilized During Treatment: Gait belt;Oxygen Activity Tolerance: Patient limited by fatigue Patient left: in chair;with call bell/phone within reach;with chair alarm set Nurse Communication: Mobility status;Other (comment) PT Visit Diagnosis: Unsteadiness on feet (R26.81);History of falling (Z91.81);Difficulty in walking, not elsewhere classified (R26.2);Muscle weakness (generalized) (M62.81);Pain Pain - Right/Left: Right Pain - part of body: Hip     Time: 5784-6962 PT Time Calculation (min) (ACUTE ONLY): 27 min  Charges:  $Therapeutic Exercise: 8-22 mins $Therapeutic  Activity: 8-22 mins                     Jetta Lout PTA 01/24/20, 4:07 PM

## 2020-01-25 ENCOUNTER — Inpatient Hospital Stay: Payer: Medicare Other

## 2020-01-25 DIAGNOSIS — W19XXXA Unspecified fall, initial encounter: Secondary | ICD-10-CM

## 2020-01-25 LAB — LACTIC ACID, PLASMA: Lactic Acid, Venous: 1 mmol/L (ref 0.5–1.9)

## 2020-01-25 LAB — BASIC METABOLIC PANEL
Anion gap: 8 (ref 5–15)
BUN: 15 mg/dL (ref 8–23)
CO2: 27 mmol/L (ref 22–32)
Calcium: 8.7 mg/dL — ABNORMAL LOW (ref 8.9–10.3)
Chloride: 99 mmol/L (ref 98–111)
Creatinine, Ser: 0.79 mg/dL (ref 0.44–1.00)
GFR calc Af Amer: >60 mL/min (ref >60)
GFR calc non Af Amer: >60 mL/min (ref >60)
Glucose, Bld: 118 mg/dL — ABNORMAL HIGH (ref 70–99)
Potassium: 3.6 mmol/L (ref 3.5–5.1)
Sodium: 134 mmol/L — ABNORMAL LOW (ref 135–145)

## 2020-01-25 LAB — CBC
HCT: 29.2 % — ABNORMAL LOW (ref 36.0–46.0)
Hemoglobin: 10 g/dL — ABNORMAL LOW (ref 12.0–15.0)
MCH: 30.1 pg (ref 26.0–34.0)
MCHC: 34.2 g/dL (ref 30.0–36.0)
MCV: 88 fL (ref 80.0–100.0)
Platelets: 407 10*3/uL — ABNORMAL HIGH (ref 150–400)
RBC: 3.32 MIL/uL — ABNORMAL LOW (ref 3.87–5.11)
RDW: 14.2 % (ref 11.5–15.5)
WBC: 17.4 10*3/uL — ABNORMAL HIGH (ref 4.0–10.5)
nRBC: 0 % (ref 0.0–0.2)

## 2020-01-25 LAB — PROCALCITONIN: Procalcitonin: <0.1 ng/mL

## 2020-01-25 LAB — TROPONIN I (HIGH SENSITIVITY)
Troponin I (High Sensitivity): 4137 ng/L (ref <18)
Troponin I (High Sensitivity): 3828 ng/L (ref <18)

## 2020-01-25 MED ORDER — LEVOFLOXACIN 500 MG PO TABS
500.0000 mg | ORAL_TABLET | ORAL | Status: DC
  Administered 2020-01-26 – 2020-01-27 (×2): 500 mg via ORAL
  Filled 2020-01-25 (×2): qty 1

## 2020-01-25 MED ORDER — HYDROMORPHONE HCL 1 MG/ML IJ SOLN
1.0000 mg | Freq: Once | INTRAMUSCULAR | Status: AC
  Administered 2020-01-25: 1 mg via INTRAVENOUS
  Filled 2020-01-25: qty 1

## 2020-01-25 MED ORDER — METHYLPREDNISOLONE SODIUM SUCC 40 MG IJ SOLR
30.0000 mg | Freq: Every day | INTRAMUSCULAR | Status: DC
  Administered 2020-01-26: 30 mg via INTRAVENOUS
  Filled 2020-01-25: qty 1

## 2020-01-25 MED ORDER — HYDROMORPHONE HCL 1 MG/ML IJ SOLN
1.0000 mg | INTRAMUSCULAR | Status: DC | PRN
  Administered 2020-01-25 – 2020-01-31 (×6): 1 mg via INTRAVENOUS
  Filled 2020-01-25 (×7): qty 1

## 2020-01-25 MED ORDER — LEVOFLOXACIN 500 MG PO TABS: 500.0000 mg | ORAL_TABLET | Freq: Every day | ORAL | Status: DC

## 2020-01-25 NOTE — Progress Notes (Signed)
PROGRESS NOTE    Patient: Alice Wallace                            PCP: Devoria Glassing, NP                    DOB: 1941/04/14            DOA: 01/21/2020 ZOX:[id number]             DOS: 01/25/2020, 11:04 AM   LOS: 4 days   Date of Service: The patient was seen and examined on 01/25/2020  Subjective:   The patient was seen and examined this morning, stable, denies any chest pain --- improved shortness of breath, complain hip pain specifically with exertion...  Patient is deaf and mute, at baseline, able to communicate well with gestures    Brief Narrative:   Nikelle Malatesta  is a 79 y.o. Caucasian female with a known history of COPD, hypertension, deafness and muteness, presented to the emergency room with acute onset of fall at her assisted living facility with subsequent right hip pain and significant respiratory distress.  Her pulse currently was down to the 70s on her 2 L O2 by nasal cannula with associated coarse breath sounds and wheezing.  She was subsequently increased to 6 L, then 15 L via nonrebreather mask, to maintain O2 sat greater 92%.   She was thought to be in COPD exacerbation was given IV Solu-Medrol and nebulized albuterol as well as duo nebs by EMS on route to the hospital. She can read lips and written communications.  ED  RR 23, HR 106, 93-96% on 15 L of O2 via NRM   BNP of 191.6, high-sensitivity troponin I of 245 mL 1281 and CBC showed leukocytosis of 10.8 as well as anemia.  COVID-19 PCR came back negative.  EKG showed mild ectopic atrial tachycardia with  incomplete RBB, LVH with T wave inversion laterally.  Treated with aspirin, IV heparin, nebs, IV Versed, morphine Chest CTA that ruled out PE and showed right lower lobe atelectasis CT scan since suspicious for nondisplaced stress fracture of the proximal right femoral diaphysis with consideration for MRI.  [name] was notified about the patient.     Assessment & Plan:   Active Problems:   Non-STEMI  (non-ST elevated myocardial infarction) (HCC)   Acute respiratory failure with hypoxia (HCC)   COPD exacerbation (HCC)   Acute on chronic respiratory failure with COPD exacerbation /bronchitis, cannot rule out pneumonia -Improving hypoxia, shortness of breath  -Continue to improve, satting 93% on 3 L of oxygen  -Patient has been weaned off 15 L >> 6 L, 4 L >>> now on 3 L via nasal cannula  -Goal is to taper the patient off oxygen or to maintain O2 greater 92% -Continue DuoNeb bronchodilators scheduled and as needed -We will continue IV steroids--we will start tapering it down today  -We will continue with antibiotics IV Rocephin, azithromycin >>> cultures negative, modifying antibiotics to p.o. total of 5 days -We will hold off Advair Diskus.  -Status post CT angiogram chest -negative for pulmonary embolism  CT angiogram IMPRESSION: 1. Right lower lobe atelectasis versus pneumonia. 2. Moderately motion degraded chest CTA. No evidence of pulmonary embolism. 3. Advanced emphysema. Aortic Atherosclerosis   ?  History of CHF diastolic failure.. Echo reviewed no signs of overt diastolic failure, ejection fraction preserved 60-65% -Monitoring daily weight -Elevated BNP 797.4, no lower extremity edema  noted congestion on chest x-ray -For no clear reason patient is on Zaroxolyn and Lasix at home will be resumed -Appreciate cardiology input -No signs of edema or fluid retention -Additional dose of IV Lasix will be given today 20 mg   Suspected non-STEMI. -Was complaining of chest pain yesterday, improved denies any chest pain this morning Dr. [name] was following --cardiology Dr. Darrold Junker following this weekend, Recommending continue diuretics added isosorbide 30 mg daily Deferred any further intervention including cardiac cath at this time   -Continue medical management Patient has received aspirin, Plavix, statins, isosorbide    -Elevated troponin ... Troponin 2,276, this  morning 1360 >>> elevated to 4566 --EKG reviewed: Normal sinus, ST abnormality noted on lead I but no other leads,  also to be reviewed by cardiology   -The patient will be continued on IV heparin.. Discontinued 01-22-20 -On aspirin, statins, as needed sublingual nitroglycerin and morphine -2D echo reviewed ejection fraction 60-65% negative for any regional or global abnormality -Cardiology following closely ... Not recommending intervention at this time, medical management    Hypertension. -Monitor very closely, continue home medication Cardizem CD -Patient is currently receiving as needed nitroglycerin -Stabilized, monitoring   Proximal right femoral diaphysis fracture -Ortho consulted -stating that fracture is suspicious of bisphosphonate related side effect recommending discontinuing alendronate Ortho recommending nonweightbearing on right hip, close follow-up in clinic in 7-10 days  -Ortho recommending no intervention -PT/OT once patient stable -Remained stable, no surgical invention  Depression -We will continue home medication of Lexapro, -Stable  Vitamin D deficiency Continue vitamin D supplements   GERD Continue Protonix -Stable  Hypokalemia Monitoring repleting accordingly   Patient is deaf, and mute -Able to communicate by gestures adequately And writing  Leukocytosis -WBC elevated today to 17.4, afebrile, normotensive, obtaining lactic acid and procalcitonin level, chest x-ray reviewed yesterday congestions,  No signs of overt pneumonia Also on IV steroids due to respiratory failure due to COPD exacerbation Patient remains on azithromycin, IV steroids tapering down  Nutritional status:          Consultants:  Cardiology/orthopedic   --------------------------------------------------------------------------------------------------------------------------------------------- DVT prophylaxis: Heparin subcu (Heparin Gtt discontinued) Code Status:   Code  Status: Full Code Family Communication: No family member present at bedside- attempt will be made to update daily The above findings and plan of care has been discussed with patient (and family )  in detail,  they expressed understanding and agreement of above. -Advance care planning has been discussed.   Admission status:    Status is: Inpatient  Remains inpatient appropriate because:Inpatient level of care appropriate due to severity of illness   Dispo: The patient is from:  assisted living              Anticipated d/c is to: SNF              Anticipated d/c date is:  SNF in 1-2 days               Patient currently is not medically stable to d/c.        Procedures:   No admission procedures for hospital encounter.   Antimicrobials:  Anti-infectives (From admission, onward)   Start     Dose/Rate Route Frequency Ordered Stop   01/26/20 1000  levofloxacin (LEVAQUIN) tablet 500 mg        500 mg Oral Every 24 hours 01/25/20 0902 01/30/20 0959   01/25/20 1000  levofloxacin (LEVAQUIN) tablet 500 mg  Status:  Discontinued  500 mg Oral Daily 01/25/20 0838 01/25/20 0902   01/24/20 1000  levofloxacin (LEVAQUIN) tablet 500 mg        500 mg Oral Daily 01/23/20 1126 01/25/20 0836   01/21/20 1530  ceFAZolin (ANCEF) IVPB 2g/100 mL premix  Status:  Discontinued        2 g 200 mL/hr over 30 Minutes Intravenous  Once 01/21/20 0738 01/21/20 1009   01/21/20 0730  azithromycin (ZITHROMAX) 500 mg in sodium chloride 0.9 % 250 mL IVPB  Status:  Discontinued        500 mg 250 mL/hr over 60 Minutes Intravenous Every 24 hours 01/21/20 0615 01/23/20 1123   01/21/20 0700  cefTRIAXone (ROCEPHIN) 1 g in sodium chloride 0.9 % 100 mL IVPB  Status:  Discontinued        1 g 200 mL/hr over 30 Minutes Intravenous Every 24 hours 01/21/20 0615 01/23/20 1123       Medication:  . alum & mag hydroxide-simeth  30 mL Oral TID AC & HS  . aspirin EC  81 mg Oral Daily  . atorvastatin  80 mg Oral Daily    . calcium-vitamin D  1 tablet Oral BID  . Chlorhexidine Gluconate Cloth  6 each Topical Q0600  . cholecalciferol  5,000 Units Oral Daily  . clopidogrel  75 mg Oral Daily  . diltiazem  240 mg Oral Daily  . enoxaparin (LOVENOX) injection  40 mg Subcutaneous Q24H  . escitalopram  10 mg Oral Daily  . feeding supplement (ENSURE ENLIVE)  237 mL Oral TID BM  . ferrous sulfate  325 mg Oral BID WC  . furosemide  20 mg Intravenous BID  . guaiFENesin  600 mg Oral Daily  . ipratropium-albuterol  3 mL Nebulization QID  . isosorbide mononitrate  30 mg Oral Daily  . [START ON 01/26/2020] levofloxacin  500 mg Oral Q24H  . lisinopril  5 mg Oral Daily  . loratadine  10 mg Oral Daily  . [START ON 01/26/2020] methylPREDNISolone (SOLU-MEDROL) injection  30 mg Intravenous Daily  . metolazone  2.5 mg Oral BID  . mupirocin ointment  1 application Nasal BID  . pantoprazole  40 mg Oral Daily  . potassium chloride SA  40 mEq Oral Daily    acetaminophen **OR** acetaminophen, albuterol, dextromethorphan, diphenhydrAMINE, loperamide, magnesium hydroxide, morphine injection, nitroGLYCERIN, ondansetron **OR** ondansetron (ZOFRAN) IV, tiZANidine, traZODone   Objective:   Vitals:   01/25/20 0403 01/25/20 0500 01/25/20 0830 01/25/20 0835  BP: 127/68  124/76   Pulse: 93  96   Resp: 20  18   Temp: 97.8 F (36.6 C)     TempSrc: Oral     SpO2: 93%  93% 93%  Weight:  73.9 kg    Height:        Intake/Output Summary (Last 24 hours) at 01/25/2020 1104 Last data filed at 01/25/2020 1000 Gross per 24 hour  Intake 480 ml  Output 2350 ml  Net -1870 ml   Filed Weights   01/22/20 0547 01/24/20 0538 01/25/20 0500  Weight: 56.2 kg 68.5 kg 73.9 kg     Examination:         Physical Exam:   General:  Alert, oriented, cooperative, no distress; deaf and mute, still able to communicate well with gestures --complaining of mild hip pain  HEENT:  Normocephalic, PERRL, otherwise with in Normal limits   Neuro:   CNII-XII intact. , normal motor and sensation, reflexes intact   Lungs:   Clear to auscultation  BL, Respirations unlabored, no wheezes / crackles  Cardio:    S1/S2, RRR, No murmure, No Rubs or Gallops   Abdomen:   Soft, non-tender, bowel sounds active all four quadrants,  no guarding or peritoneal signs.  Muscular skeletal:  Limited exam - in bed, able to move all 4 extremities, Normal strength,  2+ pulses,  symmetric, No pitting edema  Skin:  Dry, warm to touch, negative for any Rashes, No open wounds  Wounds: Please see nursing documentation Pressure Injury 05/27/19 Ischial tuberosity Left Unstageable - Full thickness tissue loss in which the base of the injury is covered by slough (yellow, tan, gray, green or brown) and/or eschar (tan, brown or black) in the wound bed. (Active)  05/27/19 1630  Location: Ischial tuberosity  Location Orientation: Left  Staging: Unstageable - Full thickness tissue loss in which the base of the injury is covered by slough (yellow, tan, gray, green or brown) and/or eschar (tan, brown or black) in the wound bed.  Wound Description (Comments):   Present on Admission: Yes                 ------------------------------------------------------------------------------------------------------------------------------------------    LABs:  CBC Latest Ref Rng & Units 01/25/2020 01/24/2020 01/23/2020  WBC 4.0 - 10.5 K/uL 17.4(H) 10.0 10.3  Hemoglobin 12.0 - 15.0 g/dL 10.0(L) 9.4(L) 8.7(L)  Hematocrit 36 - 46 % 29.2(L) 28.7(L) 26.0(L)  Platelets 150 - 400 K/uL 407(H) 283 259   CMP Latest Ref Rng & Units 01/25/2020 01/24/2020 01/23/2020  Glucose 70 - 99 mg/dL 518(A) 416(S) 063(K)  BUN 8 - 23 mg/dL 15 12 14   Creatinine 0.44 - 1.00 mg/dL 1.60 1.09  Sodium 135 - 145 mmol/L 134(L) 138 138  Potassium 3.5 - 5.1 mmol/L 3.6 3.5 3.6  Chloride 98 - 111 mmol/L 99 101 104  CO2 22 - 32 mmol/L 27 28 25   Calcium 8.9 - 10.3 mg/dL 3.23) ) 5.5(D)  Total Protein 6.5 - 8.1  g/dL - - -  Total Bilirubin 0.3 - 1.2 mg/dL - - -  Alkaline Phos 38 - 126 U/L - - -  AST 15 - 41 U/L - - -  ALT 0 - 44 U/L - - -       Micro Results Recent Results (from the past 240 hour(s))  SARS Coronavirus 2 by RT PCR (hospital order, performed in [facility] hospital lab) Nasopharyngeal Nasopharyngeal Swab     Status: None   Collection Time: 01/21/20  2:53 AM   Specimen: Nasopharyngeal Swab  Result Value Ref Range Status   SARS Coronavirus 2 NEGATIVE NEGATIVE Final    Comment: (NOTE) SARS-CoV-2 target nucleic acids are NOT DETECTED.  The SARS-CoV-2 RNA is generally detectable in upper and lower respiratory specimens during the acute phase of infection. The lowest concentration of SARS-CoV-2 viral copies this assay can detect is 250 copies / mL. A negative result does not preclude SARS-CoV-2 infection and should not be used as the sole basis for treatment or other patient management decisions.  A negative result may occur with improper specimen collection / handling, submission of specimen other than nasopharyngeal swab, presence of viral mutation(s) within the areas targeted by this assay, and inadequate number of viral copies (<250 copies / mL). A negative result must be combined with clinical observations, patient history, and epidemiological information.  Fact Sheet for Patients:   [facility]  Fact Sheet for Healthcare Providers: 03/22/20  This test is not yet approved or  cleared by the [employer] FDA  and has been authorized for detection and/or diagnosis of SARS-CoV-2 by FDA under an Emergency Use Authorization (EUA).  This EUA will remain in effect (meaning this test can be used) for the duration of the COVID-19 declaration under Section 564(b)(1) of the Act, 21 U.S.C. section 360bbb-3(b)(1), unless the authorization is terminated or revoked sooner.  Performed at [facility], [address] [place], [place] [address]   MRSA PCR Screening     Status: Abnormal   Collection Time: 01/22/20  5:15 PM   Specimen: Nasopharyngeal  Result Value Ref Range Status   MRSA by PCR POSITIVE (A) NEGATIVE Final    Comment:        The GeneXpert MRSA Assay (FDA approved for NASAL specimens only), is one component of a comprehensive MRSA colonization surveillance program. It is not intended to diagnose MRSA infection nor to guide or monitor treatment for MRSA infections. RESULT CALLED TO, READ BACK BY AND VERIFIED WITH: BEBRA EMBDEN 01/22/20 AT 2211 BY ACR Performed at [facility], [address], [place], [place] [address]     Radiology Reports CT Angio Chest PE W/Cm &/Or Wo Cm  Result Date: 01/21/2020 CLINICAL DATA:  Fall and shortness of breath. EXAM: CT ANGIOGRAPHY CHEST WITH CONTRAST TECHNIQUE: Multidetector CT imaging of the chest was performed using the standard protocol during bolus administration of intravenous contrast. Multiplanar CT image reconstructions and MIPs were obtained to evaluate the vascular anatomy. CONTRAST:  6mL OMNIPAQUE IOHEXOL 350 MG/ML SOLN COMPARISON:  06/09/2019 FINDINGS: Cardiovascular: Prominent right ventricular size. No pericardial effusion. Aortic and great vessel atherosclerotic calcification. No pulmonary artery filling defect is seen. There is significant motion artifact at the lung bases. Mediastinum/Nodes: Small to moderate sliding hiatal hernia. No adenopathy. Lungs/Pleura: Opacity in the right lower lobe with volume loss. No edema, effusion, or pneumothorax. Panlobular emphysema. Dystrophic calcifications in the apical lungs. Bronchomalacia. Upper Abdomen: Atherosclerotic calcification. Musculoskeletal: Healing anterior and lateral left rib fractures. Review of the MIP images confirms the above findings. IMPRESSION: 1. Right lower lobe atelectasis versus pneumonia. 2. Moderately motion degraded chest CTA. No evidence of pulmonary  embolism. 3. Advanced emphysema. Aortic Atherosclerosis (ICD10-I70.0) and Emphysema (ICD10-J43.9). Electronically Signed   By: Marnee Spring M.D.   On: 01/21/2020 05:55   CT Hip Right Wo Contrast  Result Date: 01/21/2020 CLINICAL DATA:  Hip trauma.  Fracture suspected. EXAM: CT OF THE RIGHT HIP WITHOUT CONTRAST TECHNIQUE: Multidetector CT imaging of the right hip was performed according to the standard protocol. Multiplanar CT image reconstructions were also generated. COMPARISON:  Hip x-rays from earlier same day. FINDINGS: Bones/Joint/Cartilage Linear lucency identified in the lateral cortex of the proximal right femoral diaphysis associated with focal cortical thickening. Imaging features compatible with nondisplaced stress fracture. The abnormality is included on the extreme inferior aspect of this study and is best visualized on sagittal reconstruction image 21 of series 5. Degenerative changes are noted in the right hip. Ligaments Suboptimally assessed by CT. Muscles and Tendons No substantial hemorrhage noted in the proximal thigh musculature. Soft tissues Distended urinary bladder is incompletely visualized. IMPRESSION: Linear lucency in the lateral cortex of the proximal right femoral diaphysis associated with focal cortical thickening. Imaging features compatible with nondisplaced stress fracture. Consider MRI of the proximal right femur for definitive characterization. Electronically Signed   By: Kennith Center M.D.   On: 01/21/2020 06:02   DG Chest Port 1 View  Result Date: 01/24/2020 CLINICAL DATA:  COPD exacerbation. Pt presented 01/21/2020 to the emergency room  with acute onset of fall at her assisted living facility with subsequent right hip pain and significant respiratory distress. Hx - COPD, HTN, non-smoker. EXAM: PORTABLE CHEST 1 VIEW COMPARISON:  01/21/2020 FINDINGS: Changes of emphysema skeletal lung scarring and interstitial thickening are stable. Opacity at the right lung base has mildly  improved from the previous study. No new lung abnormalities. No convincing pleural effusion and no pneumothorax. Cardiac silhouette is normal in size. Multiple bilateral old rib fractures are stable. IMPRESSION: 1. Mild interval improvement. Decreased opacity at the right lung base consistent with improved pneumonia or atelectasis. 2. No other change. Stable chronic findings including extensive emphysema. Electronically Signed   By: Amie Portlandavid  Ormond M.D.   On: 01/24/2020 10:43   DG Chest Portable 1 View  Result Date: 01/21/2020 CLINICAL DATA:  Larey SeatFell, short of breath, hypoxia EXAM: PORTABLE CHEST 1 VIEW COMPARISON:  06/07/2019 FINDINGS: Single frontal view of the chest demonstrates a stable cardiac silhouette. Extensive bullous emphysematous changes are seen, greatest at the right apex. Marked areas of scarring and fibrosis are seen throughout the lungs. Increased density at the right costophrenic angle may reflect consolidation and/or effusion. Chronic posttraumatic changes of the thoracic cage. No acute displaced fractures. IMPRESSION: 1. 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 2. Density at the right lateral lung base may reflect focal lung consolidation and/or small effusion. Electronically Signed   By: Sharlet SalinaMichael  Brown M.D.   On: 01/21/2020 03:48   ECHOCARDIOGRAM COMPLETE  Result Date: 01/21/2020    ECHOCARDIOGRAM REPORT   Patient Name:   [facility] Date of Exam: 01/21/2020 Medical Rec #:  [id number]     Height:       66.0 in Accession #:    [id number]    Weight:       123.2 lb Date of Birth:  02/15/1941      BSA:          1.628 m Patient Age:    79 years      BP:           116/63 mmHg Patient Gender: F             HR:           74 bpm. Exam Location:  [facility] Procedure: 2D Echo, Cardiac Doppler and Color Doppler Indications:     NSTEMI 121.4  History:         Patient has no prior history of Echocardiogram examinations.                  COPD; Risk Factors:Hypertension.  Sonographer:      Cristela BlueJerry Hege RDCS (AE) Referring Phys:  [id number] Vernetta HoneyJAN A MANSY Diagnosing Phys: Debbe OdeaBrian Agbor-Etang MD IMPRESSIONS  1. Left ventricular ejection fraction, by estimation, is 60 to 65%. The left ventricle has normal function. The left ventricle has no regional wall motion abnormalities. Left ventricular diastolic parameters are consistent with Grade I diastolic dysfunction (impaired relaxation).  2. Right ventricular systolic function is normal. The right ventricular size is normal.  3. Left atrial size was moderately dilated.  4. The mitral valve is normal in structure. No evidence of mitral valve regurgitation. No evidence of mitral stenosis.  5. The aortic valve is normal in structure. Aortic valve regurgitation is not visualized. No aortic stenosis is present.  6. The inferior vena cava is normal in size with greater than 50% respiratory variability, suggesting right atrial pressure of 3 mmHg. FINDINGS  Left Ventricle: Left ventricular ejection fraction,  by estimation, is 60 to 65%. The left ventricle has normal function. The left ventricle has no regional wall motion abnormalities. The left ventricular internal cavity size was normal in size. There is  no left ventricular hypertrophy. Left ventricular diastolic parameters are consistent with Grade I diastolic dysfunction (impaired relaxation). Right Ventricle: The right ventricular size is normal. No increase in right ventricular wall thickness. Right ventricular systolic function is normal. Left Atrium: Left atrial size was moderately dilated. Right Atrium: Right atrial size was normal in size. Pericardium: There is no evidence of pericardial effusion. Mitral Valve: The mitral valve is normal in structure. Normal mobility of the mitral valve leaflets. No evidence of mitral valve regurgitation. No evidence of mitral valve stenosis. Tricuspid Valve: The tricuspid valve is normal in structure. Tricuspid valve regurgitation is not demonstrated. No evidence of tricuspid  stenosis. Aortic Valve: The aortic valve is normal in structure. Aortic valve regurgitation is not visualized. No aortic stenosis is present. Aortic valve mean gradient measures 3.5 mmHg. Aortic valve peak gradient measures 6.5 mmHg. Aortic valve area, by VTI measures 2.46 cm. Pulmonic Valve: The pulmonic valve was not well visualized. Pulmonic valve regurgitation is not visualized. No evidence of pulmonic stenosis. Aorta: The aortic root is normal in size and structure. Venous: The inferior vena cava was not well visualized. The inferior vena cava is normal in size with greater than 50% respiratory variability, suggesting right atrial pressure of 3 mmHg. IAS/Shunts: No atrial level shunt detected by color flow Doppler.  LEFT VENTRICLE PLAX 2D LVIDd:         4.77 cm  Diastology LVIDs:         3.08 cm  LV e' lateral:   7.83 cm/s LV PW:         1.15 cm  LV E/e' lateral: 11.6 LV IVS:        1.11 cm  LV e' medial:    5.87 cm/s LVOT diam:     2.00 cm  LV E/e' medial:  15.5 LV SV:         68 LV SV Index:   42 LVOT Area:     3.14 cm  RIGHT VENTRICLE RV Basal diam:  3.63 cm RV S prime:     12.40 cm/s TAPSE (M-mode): 3.6 cm LEFT ATRIUM             Index       RIGHT ATRIUM           Index LA diam:        4.80 cm 2.95 cm/m  RA Area:     15.10 cm LA Vol (A2C):   83.7 ml 51.42 ml/m RA Volume:   35.80 ml  21.99 ml/m LA Vol (A4C):   96.5 ml 59.28 ml/m LA Biplane Vol: 91.0 ml 55.90 ml/m  AORTIC VALVE                   PULMONIC VALVE AV Area (Vmax):    2.25 cm    PV Vmax:        0.92 m/s AV Area (Vmean):   2.51 cm    PV Peak grad:   3.4 mmHg AV Area (VTI):     2.46 cm    RVOT Peak grad: 5 mmHg AV Vmax:           127.00 cm/s AV Vmean:          86.450 cm/s AV VTI:  0.277 m AV Peak Grad:      6.5 mmHg AV Mean Grad:      3.5 mmHg LVOT Vmax:         90.90 cm/s LVOT Vmean:        69.000 cm/s LVOT VTI:          0.217 m LVOT/AV VTI ratio: 0.78  AORTA Ao Root diam: 3.50 cm MITRAL VALVE                TRICUSPID VALVE MV  Area (PHT): 3.34 cm     TR Peak grad:   30.7 mmHg MV Decel Time: 227 msec     TR Vmax:        277.00 cm/s MV E velocity: 91.20 cm/s MV A velocity: 137.00 cm/s  SHUNTS MV E/A ratio:  0.67         Systemic VTI:  0.22 m                             Systemic Diam: 2.00 cm Debbe Odea MD Electronically signed by Debbe Odea MD Signature Date/Time: 01/21/2020/4:26:00 PM    Final    DG Hip Unilat W or Wo Pelvis 2-3 Views Right  Result Date: 01/21/2020 CLINICAL DATA:  Larey Seat, pain EXAM: DG HIP (WITH OR WITHOUT PELVIS) 2-3V RIGHT COMPARISON:  01/31/2017 FINDINGS: Frontal view of the pelvis as well as frontal and cross-table lateral views of the right hip are obtained. There is a nondisplaced transverse fracture through the proximal femoral diaphysis. Alignment is anatomic. There are no other acute displaced fractures. The hips are well aligned. Mild bilateral hip osteoarthritis. Sacroiliac joints are normal. IMPRESSION: 1. Nondisplaced transverse fracture through the proximal right femoral diaphysis. Fracture line involves an area of chronic focal cortical thickening, and likely reflects an acute fracture at a site of chronic stress. Electronically Signed   By: Sharlet Salina M.D.   On: 01/21/2020 03:46    SIGNED: Kendell Bane, MD, FACP, FHM. [facility],  Pager (please use [email or web address] to page/text)  If 7PM-7AM, please contact night-coverage [email or web address], Password [facility] 01/25/2020, 11:04 AM

## 2020-01-25 NOTE — Progress Notes (Signed)
X-rays review. Completed subtrochanteric fracture with displacement. Plan trochanteric femoral nailing tomorrow. NPO after MN. Hold Plavix and Lovenox.

## 2020-01-25 NOTE — Progress Notes (Addendum)
Per Dr. Flossie Dibble pt can be transferred to [facility]. Jewel Monroe (Legal guardian) updated and notified of transfer and Right Hip Trochanteric Femoral Nail schedule for tomorrow. Obtained verbal consent over the phone witnessed by Shade Flood, RN. Consent in chart.

## 2020-01-25 NOTE — TOC Progression Note (Signed)
Transition of Care [facility]) - Progression Note    Patient Details  Name: Alice Wallace MRN: [id number] Date of Birth: March 04, 1941  [facility] CM/SW Contact  Ashley Royalty Lutricia Feil, RN Phone Number:[phone] 01/25/2020, 8:53 AM  Clinical Narrative:     [facility] RN attempted outreach call to guardian [facility] [place]) concerning SNF preferences however unsuccessful. RN able to leave a HIPAA approved voice message requesting a call back.  Will further engage at that time for facilities that may have beds available.      Expected Discharge Plan: Skilled Nursing Facility Barriers to Discharge: Continued Medical Work up  Expected Discharge Plan and Services Expected Discharge Plan: Skilled Nursing Facility   Discharge Planning Services: CM Consult   Living arrangements for the past 2 months: Assisted Living Facility                                       Social Determinants of Health (SDOH) Interventions    Readmission Risk Interventions No flowsheet data found.

## 2020-01-25 NOTE — Progress Notes (Signed)
[facility] Cardiology  SUBJECTIVE: Patient laying in bed, according to the nurse, she has not experienced any chest pain or worsening shortness of breath (patient is a deaf mute)   Vitals:   01/24/20 2029 01/25/20 0403 01/25/20 0500 01/25/20 0830  BP: 122/64 127/68  124/76  Pulse: (!) 104 93  96  Resp: 18 20  18   Temp: 97.8 F (36.6 C) 97.8 F (36.6 C)    TempSrc: Oral Oral    SpO2: 90% 93%  93%  Weight:   73.9 kg   Height:         Intake/Output Summary (Last 24 hours) at 01/25/2020 0949 Last data filed at 01/25/2020 03/26/2020 Gross per 24 hour  Intake --  Output 1400 ml  Net -1400 ml      PHYSICAL EXAM  General: Well developed, well nourished, in no acute distress HEENT:  Normocephalic and atramatic Neck:  No JVD.  Lungs: Clear bilaterally to auscultation and percussion. Heart: HRRR . Normal S1 and S2 without gallops or murmurs.  Abdomen: Bowel sounds are positive, abdomen soft and non-tender  Msk:  Back normal, normal gait. Normal strength and tone for age. Extremities: No clubbing, cyanosis or edema.   Neuro: Alert and oriented X 3. Psych:  Good affect, responds appropriately   LABS: Basic Metabolic Panel: Recent Labs    01/24/20 0427 01/25/20 0401  NA 138 134*  K 3.5 3.6  CL 101 99  CO2 28 27  GLUCOSE 134* 118*  BUN 12 15  CREATININE 0.51 0.79  CALCIUM 8.8* 8.7*   Liver Function Tests: Recent Labs    01/22/20 1556  AST 37  ALT 30  ALKPHOS 53  BILITOT 0.6  PROT 5.4*  ALBUMIN 3.1*   No results for input(s): LIPASE, AMYLASE in the last 72 hours. CBC: Recent Labs    01/24/20 0427 01/25/20 0401  WBC 10.0 17.4*  HGB 9.4* 10.0*  HCT 28.7* 29.2*  MCV 92.6 88.0  PLT 283 407*   Cardiac Enzymes: No results for input(s): CKTOTAL, CKMB, CKMBINDEX, TROPONINI in the last 72 hours. BNP: Invalid input(s): POCBNP D-Dimer: No results for input(s): DDIMER in the last 72 hours. Hemoglobin A1C: No results for input(s): HGBA1C in the last 72 hours. Fasting Lipid  Panel: No results for input(s): CHOL, HDL, LDLCALC, TRIG, CHOLHDL, LDLDIRECT in the last 72 hours. Thyroid Function Tests: No results for input(s): TSH, T4TOTAL, T3FREE, THYROIDAB in the last 72 hours.  Invalid input(s): FREET3 Anemia Panel: No results for input(s): VITAMINB12, FOLATE, FERRITIN, TIBC, IRON, RETICCTPCT in the last 72 hours.  DG Chest Port 1 View  Result Date: 01/24/2020 CLINICAL DATA:  COPD exacerbation. Pt presented 01/21/2020 to the emergency room with acute onset of fall at her assisted living facility with subsequent right hip pain and significant respiratory distress. Hx - COPD, HTN, non-smoker. EXAM: PORTABLE CHEST 1 VIEW COMPARISON:  01/21/2020 FINDINGS: Changes of emphysema skeletal lung scarring and interstitial thickening are stable. Opacity at the right lung base has mildly improved from the previous study. No new lung abnormalities. No convincing pleural effusion and no pneumothorax. Cardiac silhouette is normal in size. Multiple bilateral old rib fractures are stable. IMPRESSION: 1. Mild interval improvement. Decreased opacity at the right lung base consistent with improved pneumonia or atelectasis. 2. No other change. Stable chronic findings including extensive emphysema. Electronically Signed   By: 03/22/2020 M.D.   On: 01/24/2020 10:43     Echo LVEF 60% without wall motion abnormalities  TELEMETRY: Sinus rhythm:  ASSESSMENT AND PLAN:  Active Problems:   Acute respiratory failure with hypoxia (HCC)   COPD exacerbation (HCC)   Non-STEMI (non-ST elevated myocardial infarction) (HCC)    1.  NSTEMI, treated medically, heparin discontinued 01/23/2020 with recurrent chest pain. Repeat ECG shows ST elevation in lead 1 without meeting STEMI criteria, and troponin trending down, then back up out recurrent chest pain (245, 1281, 2270, 1360, 4566). Patient reports feeling better with nitroglycerin. 2D echocardiogram revealed normal left ventricular function without wall  motion abnormalities.  2. Chronic diastolic CHF, BNP elevated to 948 this morning, chest xray negative for acute abnormality. Denies shortness of breath at rest, no lower extremity edema or JVD. On oral Lasix, switched to IV Lasix 20 mg today.  3. Acute on chronic respiratory failure with COPD exacerbation and possible pneumonia, on supplemental O2 which has been weaned to 3L, IV steroids, and antibiotics  4. Hypertension, elevated this morning, on diltiazem, Lasix, metolazone  Recommendations  1.  Agree with current therapy 2.  Repeat troponin 3.  Continue isosorbide mononitrate 30 mg daily 4.  Continue diuresis 5.  Carefully monitor renal status 6.  Defer cardiac catheterization at this time   Marcina Millard, MD, PhD, [facility] 01/25/2020 9:49 AM

## 2020-01-25 NOTE — Progress Notes (Signed)
Subjective:  Patient reports pain as mild.   Objective:   VITALS:   Vitals:   01/24/20 2029 01/25/20 0403 01/25/20 0500 01/25/20 0830  BP: 122/64 127/68  124/76  Pulse: (!) 104 93  96  Resp: 18 20  18   Temp: 97.8 F (36.6 C) 97.8 F (36.6 C)    TempSrc: Oral Oral    SpO2: 90% 93%  93%  Weight:   73.9 kg   Height:        PHYSICAL EXAM:  Neurovascular intact Dorsiflexion/Plantar flexion intact No cellulitis present Compartment soft  LABS  Results for orders placed or performed during the hospital encounter of 01/21/20 (from the past 24 hour(s))  Troponin I (High Sensitivity)     Status: Abnormal   Collection Time: 01/24/20 10:35 AM  Result Value Ref Range   Troponin I (High Sensitivity) 1,360 (HH) <18 ng/L  Troponin I (High Sensitivity)     Status: Abnormal   Collection Time: 01/24/20 12:03 PM  Result Value Ref Range   Troponin I (High Sensitivity) 4,566 (HH) <18 ng/L  CBC     Status: Abnormal   Collection Time: 01/25/20  4:01 AM  Result Value Ref Range   WBC 17.4 (H) 4.0 - 10.5 K/uL   RBC 3.32 (L) 3.87 - 5.11 MIL/uL   Hemoglobin 10.0 (L) 12.0 - 15.0 g/dL   HCT 03/26/20 (L) 36 - 46 %   MCV 88.0 80.0 - 100.0 fL   MCH 30.1 26.0 - 34.0 pg   MCHC 34.2 30.0 - 36.0 g/dL   RDW 82.4 23.5 - 36.1 %   Platelets 407 (H) 150 - 400 K/uL   nRBC 0.0 0.0 - 0.2 %  Basic metabolic panel     Status: Abnormal   Collection Time: 01/25/20  4:01 AM  Result Value Ref Range   Sodium 134 (L) 135 - 145 mmol/L   Potassium 3.6 3.5 - 5.1 mmol/L   Chloride 99 98 - 111 mmol/L   CO2 27 22 - 32 mmol/L   Glucose, Bld 118 (H) 70 - 99 mg/dL   BUN 15 8 - 23 mg/dL   Creatinine, Ser 03/26/20 0.44 - 1.00 mg/dL   Calcium 8.7 (L) 8.9 - 10.3 mg/dL   GFR calc non Af Amer >60 >60 mL/min   GFR calc Af Amer >60 >60 mL/min   Anion gap 8 5 - 15  Lactic acid, plasma     Status: None   Collection Time: 01/25/20  8:58 AM  Result Value Ref Range   Lactic Acid, Venous 1.0 0.5 - 1.9 mmol/L    DG Chest Port 1  View  Result Date: 01/24/2020 CLINICAL DATA:  COPD exacerbation. Pt presented 01/21/2020 to the emergency room with acute onset of fall at her assisted living facility with subsequent right hip pain and significant respiratory distress. Hx - COPD, HTN, non-smoker. EXAM: PORTABLE CHEST 1 VIEW COMPARISON:  01/21/2020 FINDINGS: Changes of emphysema skeletal lung scarring and interstitial thickening are stable. Opacity at the right lung base has mildly improved from the previous study. No new lung abnormalities. No convincing pleural effusion and no pneumothorax. Cardiac silhouette is normal in size. Multiple bilateral old rib fractures are stable. IMPRESSION: 1. Mild interval improvement. Decreased opacity at the right lung base consistent with improved pneumonia or atelectasis. 2. No other change. Stable chronic findings including extensive emphysema. Electronically Signed   By: 03/22/2020 M.D.   On: 01/24/2020 10:43    Assessment/Plan: 4 Days Post-Op   Active  Problems:   Acute respiratory failure with hypoxia (HCC)   COPD exacerbation (HCC)   Non-STEMI (non-ST elevated myocardial infarction) (HCC)   Up with therapy  TDWB on the right leg Follow-up in the office in 7 to 10 days after discharge No surgery planned for this hospital visit Please call with questions   Lyndle Herrlich , MD 01/25/2020, 10:05 AM

## 2020-01-25 NOTE — Progress Notes (Signed)
Physical Therapy Treatment Patient Details Name: Alice Wallace MRN: [id number] DOB: Apr 13, 1941 Today's Date: 01/25/2020    History of Present Illness Per MD notes: Pt is a 79 y.o. caucasian female with a known history of COPD, hypertension, deafness and muteness, presented to the emergency room with acute onset of fall at her assisted living facility with subsequent right hip pain and significant respiratory distress.  She was thought to be in COPD exacerbation was given IV Solu-Medrol and nebulized albuterol as well as duo nebs by EMS on route to the hospital.  Pt found to have a proximal right femoral diaphysis fracture with no surgical intervention planned at this time.  MD assessment also includes: Acute on chronic respiratory failure with COPD exacerbation /bronchitis, cannot rule out pneumonia, h/o CHF, suspected NSTEMI, HTN, hypokalemia, and depression.    PT Comments    Pt in bed, ready for session,  Comfortable at rest.  Attempted ROM BLE prior to mobility but she is unable to tolerate any activity or ROM today.  RN notified and requested medication if available.  Follow Up Recommendations  SNF     Equipment Recommendations       Recommendations for Other Services       Precautions / Restrictions Precautions Precautions: Fall Restrictions Weight Bearing Restrictions: Yes RLE Weight Bearing: Non weight bearing Other Position/Activity Restrictions: Per cardiology/Dr. [name] ok for patient to participate with PT as long as there is no chest pain or exertional dyspnea    Mobility  Bed Mobility               General bed mobility comments: unable due to pain  Transfers                    Ambulation/Gait                 Stairs             Wheelchair Mobility    Modified Rankin (Stroke Patients Only)       Balance                                            Cognition Arousal/Alertness: Awake/alert Behavior During  Therapy: WFL for tasks assessed/performed Overall Cognitive Status: Within Functional Limits for tasks assessed                                        Exercises      General Comments        Pertinent Vitals/Pain Pain Assessment: Faces Faces Pain Scale: Hurts worst Pain Location: R hip with attempts at mobiltiy - no pain at rest. Pain Descriptors / Indicators: Grimacing;Moaning Pain Intervention(s): Limited activity within patient's tolerance;Patient requesting pain meds-RN notified    Home Living                      Prior Function            PT Goals (current goals can now be found in the care plan section) Progress towards PT goals: Not progressing toward goals - comment    Frequency    7X/week      PT Plan Current plan remains appropriate    Co-evaluation  AM-PAC PT "6 Clicks" Mobility   Outcome Measure  Help needed turning from your back to your side while in a flat bed without using bedrails?: A Lot Help needed moving from lying on your back to sitting on the side of a flat bed without using bedrails?: A Lot Help needed moving to and from a bed to a chair (including a wheelchair)?: A Lot Help needed standing up from a chair using your arms (e.g., wheelchair or bedside chair)?: A Little Help needed to walk in hospital room?: Total Help needed climbing 3-5 steps with a railing? : Total 6 Click Score: 11    End of Session   Activity Tolerance: Patient limited by pain Patient left: in bed;with call bell/phone within reach;with bed alarm set Nurse Communication: Mobility status;Other (comment) Pain - Right/Left: Right Pain - part of body: Hip     Time: 4503-8882 PT Time Calculation (min) (ACUTE ONLY): 14 min  Charges:  $Therapeutic Exercise: 8-22 mins                    Danielle Dess, PTA 01/25/20, 1:06 PM

## 2020-01-25 NOTE — Progress Notes (Addendum)
[name] notified about R hip xray results.Marland Kitchen

## 2020-01-25 NOTE — Progress Notes (Addendum)
Patient appears to be in a lot of pain and Right hip appears to be dislocated, pt diaphoretic, pt is deaf and mute. Shes had PRN morphine twice and doesn't seem to help. Charge nurse aware. [name] (orthopedic) paged. Pt also complains of nausea. PRN zofran and tylenol given. VSS. Dr. Flossie Dibble also notified.Received verbal orders for STAT xray of R hip and one time dose of diludid 1mg . Will give and continue to monitor.

## 2020-01-26 ENCOUNTER — Encounter
Admission: EM | Disposition: A | Discharge status: Skilled Nursing Facility | Payer: Self-pay | Source: Home / Self Care | Attending: Family Medicine

## 2020-01-26 ENCOUNTER — Inpatient Hospital Stay: Payer: Medicare Other

## 2020-01-26 ENCOUNTER — Inpatient Hospital Stay: Payer: Medicare Other | Admitting: Anesthesiology

## 2020-01-26 ENCOUNTER — Encounter: Payer: Self-pay | Admitting: Family Medicine

## 2020-01-26 DIAGNOSIS — S72009A Fracture of unspecified part of neck of unspecified femur, initial encounter for closed fracture: Secondary | ICD-10-CM | POA: Diagnosis present

## 2020-01-26 HISTORY — PX: INTRAMEDULLARY (IM) NAIL INTERTROCHANTERIC: SHX5875

## 2020-01-26 LAB — BASIC METABOLIC PANEL
Anion gap: 14 (ref 5–15)
BUN: 26 mg/dL — ABNORMAL HIGH (ref 8–23)
CO2: 31 mmol/L (ref 22–32)
Calcium: 9 mg/dL (ref 8.9–10.3)
Chloride: 91 mmol/L — ABNORMAL LOW (ref 98–111)
Creatinine, Ser: 1.31 mg/dL — ABNORMAL HIGH (ref 0.44–1.00)
GFR calc Af Amer: 45 mL/min — ABNORMAL LOW (ref >60)
GFR calc non Af Amer: 39 mL/min — ABNORMAL LOW (ref >60)
Glucose, Bld: 137 mg/dL — ABNORMAL HIGH (ref 70–99)
Potassium: 4 mmol/L (ref 3.5–5.1)
Sodium: 136 mmol/L (ref 135–145)

## 2020-01-26 LAB — CBC
HCT: 33.9 % — ABNORMAL LOW (ref 36.0–46.0)
Hemoglobin: 11 g/dL — ABNORMAL LOW (ref 12.0–15.0)
MCH: 29.7 pg (ref 26.0–34.0)
MCHC: 32.4 g/dL (ref 30.0–36.0)
MCV: 91.6 fL (ref 80.0–100.0)
Platelets: 465 10*3/uL — ABNORMAL HIGH (ref 150–400)
RBC: 3.7 MIL/uL — ABNORMAL LOW (ref 3.87–5.11)
RDW: 14.3 % (ref 11.5–15.5)
WBC: 20.9 10*3/uL — ABNORMAL HIGH (ref 4.0–10.5)
nRBC: 0 % (ref 0.0–0.2)

## 2020-01-26 SURGERY — FIXATION, FRACTURE, INTERTROCHANTERIC, WITH INTRAMEDULLARY ROD
Anesthesia: General | Laterality: Right

## 2020-01-26 MED ORDER — LACTATED RINGERS IV SOLN: INTRAVENOUS | Status: DC | PRN

## 2020-01-26 MED ORDER — SODIUM CHLORIDE 0.9 % IR SOLN
Status: DC | PRN
  Administered 2020-01-26: 1000 mL

## 2020-01-26 MED ORDER — METOCLOPRAMIDE HCL 5 MG/ML IJ SOLN: 5.0000 mg | Freq: Three times a day (TID) | INTRAMUSCULAR | Status: DC | PRN

## 2020-01-26 MED ORDER — IPRATROPIUM-ALBUTEROL 0.5-2.5 (3) MG/3ML IN SOLN
3.0000 mL | Freq: Three times a day (TID) | RESPIRATORY_TRACT | Status: DC
  Administered 2020-01-26 – 2020-02-03 (×21): 3 mL via RESPIRATORY_TRACT
  Filled 2020-01-26 (×20): qty 3

## 2020-01-26 MED ORDER — FENTANYL CITRATE (PF) 100 MCG/2ML IJ SOLN: 25.0000 ug | INTRAMUSCULAR | Status: DC | PRN

## 2020-01-26 MED ORDER — VASOPRESSIN 20 UNIT/ML IV SOLN
INTRAVENOUS | Status: AC
  Filled 2020-01-26: qty 1

## 2020-01-26 MED ORDER — PHENYLEPHRINE HCL (PRESSORS) 10 MG/ML IV SOLN
INTRAVENOUS | Status: AC
  Filled 2020-01-26: qty 1

## 2020-01-26 MED ORDER — IPRATROPIUM-ALBUTEROL 0.5-2.5 (3) MG/3ML IN SOLN
RESPIRATORY_TRACT | Status: AC
  Administered 2020-01-26: 3 mL via RESPIRATORY_TRACT
  Filled 2020-01-26: qty 3

## 2020-01-26 MED ORDER — IPRATROPIUM-ALBUTEROL 0.5-2.5 (3) MG/3ML IN SOLN: 3.0000 mL | Freq: Once | RESPIRATORY_TRACT | Status: AC

## 2020-01-26 MED ORDER — DOCUSATE SODIUM 100 MG PO CAPS
100.0000 mg | ORAL_CAPSULE | Freq: Two times a day (BID) | ORAL | Status: DC
  Administered 2020-01-26 – 2020-02-02 (×13): 100 mg via ORAL
  Filled 2020-01-26 (×15): qty 1

## 2020-01-26 MED ORDER — ONDANSETRON HCL 4 MG/2ML IJ SOLN: 4.0000 mg | Freq: Once | INTRAMUSCULAR | Status: AC | PRN

## 2020-01-26 MED ORDER — ETOMIDATE 2 MG/ML IV SOLN
INTRAVENOUS | Status: AC
  Filled 2020-01-26: qty 10

## 2020-01-26 MED ORDER — SUCCINYLCHOLINE CHLORIDE 200 MG/10ML IV SOSY
PREFILLED_SYRINGE | INTRAVENOUS | Status: AC
  Filled 2020-01-26: qty 10

## 2020-01-26 MED ORDER — SUCCINYLCHOLINE CHLORIDE 20 MG/ML IJ SOLN
INTRAMUSCULAR | Status: DC | PRN
  Administered 2020-01-26: 80 mg via INTRAVENOUS
  Administered 2020-01-26: 40 mg via INTRAVENOUS

## 2020-01-26 MED ORDER — CEFAZOLIN SODIUM-DEXTROSE 1-4 GM/50ML-% IV SOLN
1.0000 g | Freq: Three times a day (TID) | INTRAVENOUS | Status: DC
  Administered 2020-01-26 – 2020-01-27 (×4): 1 g via INTRAVENOUS
  Filled 2020-01-26 (×9): qty 50

## 2020-01-26 MED ORDER — ONDANSETRON HCL 4 MG/2ML IJ SOLN: 4.0000 mg | Freq: Four times a day (QID) | INTRAMUSCULAR | Status: DC | PRN

## 2020-01-26 MED ORDER — ONDANSETRON HCL 4 MG PO TABS: 4.0000 mg | ORAL_TABLET | Freq: Four times a day (QID) | ORAL | Status: DC | PRN

## 2020-01-26 MED ORDER — FENTANYL CITRATE (PF) 100 MCG/2ML IJ SOLN
INTRAMUSCULAR | Status: DC | PRN
Start: 2020-01-26 — End: 2020-01-26
  Administered 2020-01-26 (×3): 25 ug via INTRAVENOUS

## 2020-01-26 MED ORDER — IPRATROPIUM-ALBUTEROL 0.5-2.5 (3) MG/3ML IN SOLN: 3.0000 mL | RESPIRATORY_TRACT | Status: DC

## 2020-01-26 MED ORDER — SEVOFLURANE IN SOLN
RESPIRATORY_TRACT | Status: AC
  Filled 2020-01-26: qty 250

## 2020-01-26 MED ORDER — VASOPRESSIN 20 UNIT/ML IV SOLN
INTRAVENOUS | Status: DC | PRN
  Administered 2020-01-26: 1 [IU] via INTRAVENOUS
  Administered 2020-01-26 (×2): 2 [IU] via INTRAVENOUS
  Administered 2020-01-26: 1 [IU] via INTRAVENOUS

## 2020-01-26 MED ORDER — ETOMIDATE 2 MG/ML IV SOLN
INTRAVENOUS | Status: DC | PRN
  Administered 2020-01-26: 10 mg via INTRAVENOUS
  Administered 2020-01-26: 6 mg via INTRAVENOUS
  Administered 2020-01-26: 4 mg via INTRAVENOUS

## 2020-01-26 MED ORDER — METOCLOPRAMIDE HCL 10 MG PO TABS: 5.0000 mg | ORAL_TABLET | Freq: Three times a day (TID) | ORAL | Status: DC | PRN

## 2020-01-26 MED ORDER — TRANEXAMIC ACID 1000 MG/10ML IV SOLN
INTRAVENOUS | Status: DC | PRN
  Administered 2020-01-26: 1000 mg via TOPICAL

## 2020-01-26 MED ORDER — ONDANSETRON HCL 4 MG/2ML IJ SOLN
INTRAMUSCULAR | Status: AC
  Administered 2020-01-26: 4 mg via INTRAVENOUS
  Filled 2020-01-26: qty 2

## 2020-01-26 MED ORDER — FENTANYL CITRATE (PF) 100 MCG/2ML IJ SOLN
INTRAMUSCULAR | Status: AC
  Filled 2020-01-26: qty 2

## 2020-01-26 MED ORDER — SODIUM CHLORIDE 0.9 % IV SOLN
INTRAVENOUS | Status: DC | PRN
  Administered 2020-01-26: 30 ug/min via INTRAVENOUS

## 2020-01-26 MED ORDER — PHENYLEPHRINE HCL (PRESSORS) 10 MG/ML IV SOLN
INTRAVENOUS | Status: DC | PRN
  Administered 2020-01-26: 100 ug via INTRAVENOUS
  Administered 2020-01-26: 200 ug via INTRAVENOUS
  Administered 2020-01-26 (×2): 100 ug via INTRAVENOUS

## 2020-01-26 SURGICAL SUPPLY — 38 items
BLADE TFNA HELICAL 95 NON STRL (Anchor) ×1 IMPLANT
BNDG COHESIVE 4X5 TAN STRL (GAUZE/BANDAGES/DRESSINGS) IMPLANT
BRUSH SCRUB EZ  4% CHG (MISCELLANEOUS) ×2
BRUSH SCRUB EZ 4% CHG (MISCELLANEOUS) ×2 IMPLANT
CANISTER SUCT 1200ML W/VALVE (MISCELLANEOUS) ×2 IMPLANT
CHLORAPREP W/TINT 26 (MISCELLANEOUS) ×2 IMPLANT
COVER WAND RF STERILE (DRAPES) ×2 IMPLANT
DRAPE 3/4 80X56 (DRAPES) ×2 IMPLANT
DRAPE U-SHAPE 47X51 STRL (DRAPES) ×2 IMPLANT
DRSG AQUACEL AG ADV 3.5X 4 (GAUZE/BANDAGES/DRESSINGS) IMPLANT
DRSG AQUACEL AG ADV 3.5X10 (GAUZE/BANDAGES/DRESSINGS) ×2 IMPLANT
ELECT REM PT RETURN 9FT ADLT (ELECTROSURGICAL) ×2
ELECTRODE REM PT RTRN 9FT ADLT (ELECTROSURGICAL) ×1 IMPLANT
GAUZE XEROFORM 1X8 LF (GAUZE/BANDAGES/DRESSINGS) ×2 IMPLANT
GLOVE INDICATOR 8.0 STRL GRN (GLOVE) ×2 IMPLANT
GLOVE SURG ORTHO 8.0 STRL STRW (GLOVE) ×2 IMPLANT
GOWN STRL REUS W/ TWL LRG LVL3 (GOWN DISPOSABLE) ×1 IMPLANT
GOWN STRL REUS W/ TWL XL LVL3 (GOWN DISPOSABLE) ×1 IMPLANT
GOWN STRL REUS W/TWL LRG LVL3 (GOWN DISPOSABLE) ×1
GOWN STRL REUS W/TWL XL LVL3 (GOWN DISPOSABLE) ×1
GUIDEWIRE 3.2X400 (WIRE) ×1 IMPLANT
KIT PATIENT CARE HANA TABLE (KITS) ×2 IMPLANT
KIT TURNOVER CYSTO (KITS) ×2 IMPLANT
MAT ABSORB  FLUID 56X50 GRAY (MISCELLANEOUS) ×1
MAT ABSORB FLUID 56X50 GRAY (MISCELLANEOUS) ×1 IMPLANT
NAIL CAN TFNA 9 130D 380 RT (Nail) ×1 IMPLANT
NDL SPNL 20GX3.5 QUINCKE YW (NEEDLE) ×1 IMPLANT
NEEDLE SPNL 20GX3.5 QUINCKE YW (NEEDLE) ×2 IMPLANT
NS IRRIG 1000ML POUR BTL (IV SOLUTION) ×2 IMPLANT
PACK HIP COMPR (MISCELLANEOUS) ×2 IMPLANT
REAMER ROD DEEP FLUTE 2.5X950 (INSTRUMENTS) ×1 IMPLANT
SCREW LOCK STAR 5X44 (Screw) ×1 IMPLANT
STAPLER SKIN PROX 35W (STAPLE) ×2 IMPLANT
SUT VIC AB 0 CT1 36 (SUTURE) ×2 IMPLANT
SUT VIC AB 2-0 CT1 27 (SUTURE) ×1
SUT VIC AB 2-0 CT1 TAPERPNT 27 (SUTURE) ×1 IMPLANT
SYR 30ML LL (SYRINGE) ×2 IMPLANT
TOWEL OR 17X26 4PK STRL BLUE (TOWEL DISPOSABLE) ×2 IMPLANT

## 2020-01-26 NOTE — Care Management Important Message (Signed)
Important Message  Patient Details  Name: Alice Wallace MRN: [id number] Date of Birth: 16-Feb-1941   Medicare Important Message Given:  Yes     [name] 01/26/2020, 11:24 AM

## 2020-01-26 NOTE — Progress Notes (Signed)
PROGRESS NOTE    Patient: Alice Wallace                            PCP: Devoria Glassing, NP                    DOB: 09-21-1940            DOA: 01/21/2020 AOZ:[id number]             DOS: 01/26/2020, 11:01 AM   LOS: 5 days   Date of Service: The patient was seen and examined on 01/26/2020  Subjective:   The patient was seen and examined this morning, awake alert oriented,--- complaining of right hip pain, prominently visibly angulated deformed hip noted.  Yesterday evening patient spontaneously moves her hip in bed--x-ray confirmed dislocated hip fracture now  Orthopedic team was notified N.p.o. overnight  Anticipating patient to be taken for open reduction internal fixation this a.m. Cardiology has cleared the patient for surgery.    Brief Narrative:   Lucill Mauck  is a 79 y.o. Caucasian female with a known history of COPD, hypertension, deafness and muteness, presented to the emergency room with acute onset of fall at her assisted living facility with subsequent right hip pain and significant respiratory distress.  Her pulse currently was down to the 70s on her 2 L O2 by nasal cannula with associated coarse breath sounds and wheezing.  She was subsequently increased to 6 L, then 15 L via nonrebreather mask, to maintain O2 sat greater 92%.   She was thought to be in COPD exacerbation was given IV Solu-Medrol and nebulized albuterol as well as duo nebs by EMS on route to the hospital. She can read lips and written communications.  ED  RR 23, HR 106, 93-96% on 15 L of O2 via NRM   BNP of 191.6, high-sensitivity troponin I of 245 mL 1281 and CBC showed leukocytosis of 10.8 as well as anemia.  COVID-19 PCR came back negative.  EKG showed mild ectopic atrial tachycardia with  incomplete RBB, LVH with T wave inversion laterally.  Treated with aspirin, IV heparin, nebs, IV Versed, morphine Chest CTA that ruled out PE and showed right lower lobe atelectasis CT scan since suspicious for  nondisplaced stress fracture of the proximal right femoral diaphysis with consideration for MRI.  [name] was notified about the patient.     Assessment & Plan:   Active Problems:   Non-STEMI (non-ST elevated myocardial infarction) (HCC)   Acute respiratory failure with hypoxia (HCC)   COPD exacerbation (HCC)   Proximal right femoral diaphysis fracture -X-ray of the hip: The previous incomplete subtrochanteric fracture is now a complete fracture, displaced with significant angulation -Yesterday 01/25/2020 right hip fracture progress to dislocate, with prominent angulation now -Orthopedic team has been on consult since admission -they were notified 01/25/2020 -N.p.o. overnight -Anticipating open reduction internal fixation today 01/26/2020  The benefit of the surgery outweighs the risk-cardiology has cleared the patient patient is moderate to too high risk due to multiple comorbidities including COPD respiratory distress and recent non-STEMI.    -Ortho consulted -stating that fracture is suspicious of bisphosphonate related side effect recommending discontinuing alendronate   Sepsis -ruling out sepsis Marked leukocytosis -WBC 17.4>> 20.9, lactic acid 1.0, procalcitonin <0.10, creatinine 1.3 (temp 97.7, pulse 83, respiratory 17, blood pressure 112/57) -Ruling out sepsis -respite distress is chronic actually improved, patient has been on steroids, -Likely reactive due to progression  of proximal right hip fracture -Afebrile normotensive -We will obtain a UA urine culture blood cultures, -We will initiate broad-spectrum antibiotics of Ancef (preemptively, will help with preop antibiotic coverage)    Acute on chronic respiratory failure with COPD exacerbation /bronchitis, cannot rule out pneumonia -Patient remained stable, satting 90% on 3 L of oxygen  -Patient has been weaned off 15 L >> 6 L, 4 L >>> now on 3 L via nasal cannula  -Goal is to taper the patient off oxygen or to  maintain O2 greater 92% -Continue DuoNeb bronchodilators scheduled and as needed -We will continue IV steroids--we will start tapering it down today  -Patient has been on azithromycin, currently on p.o. Levaquin, adding IV Ancef today -We will hold off Advair Diskus.  -Status post CT angiogram chest -negative for pulmonary embolism  CT angiogram IMPRESSION: 1. Right lower lobe atelectasis versus pneumonia. 2. Moderately motion degraded chest CTA. No evidence of pulmonary embolism. 3. Advanced emphysema. Aortic Atherosclerosis   Chronic diastolic CHF. Echo reviewed no signs of overt diastolic failure, ejection fraction preserved 60-65% -Monitoring daily weight -Elevated BNP 797.4, no lower extremity edema noted congestion on chest x-ray -For no clear reason patient is on Zaroxolyn and Lasix at home will be resumed -Appreciate cardiology input -No signs of edema or fluid retention -Additional dose of IV Lasix will be given today 20 mg    Non-STEMI. -Was complaining of chest pain yesterday, improved denies any chest pain this morning Dr. [name] was following --cardiology Dr. Darrold Junker following this weekend, Recommending continue diuretics added isosorbide 30 mg daily Deferred any further intervention including cardiac cath at this time -Has cleared the patient for orthopedic intervention-surgery   -Continue medical management Patient has received aspirin, Plavix, statins, isosorbide    -Elevated troponin ... Troponin 2,276, this morning 1360 >>> elevated to 4566 --EKG reviewed: Normal sinus, ST abnormality noted on lead I but no other leads,  also to be reviewed by cardiology   -The patient will be continued on IV heparin.. Discontinued 01-22-20 -On aspirin, statins, as needed sublingual nitroglycerin and morphine -2D echo reviewed ejection fraction 60-65% negative for any regional or global abnormality -Cardiology following closely ... Not recommending intervention at this  time, medical management    Hypertension. -Monitor very closely, continue home medication Cardizem CD -Patient is currently receiving as needed nitroglycerin -Stable    Depression -We will continue home medication of Lexapro, -No changes remained stable  Vitamin D deficiency/osteoporosis Continue vitamin D supplements--   GERD Continue Protonix -Stable  Hypokalemia Monitoring repleting accordingly   Patient is deaf, and mute -Able to communicate by gestures adequately And writing   Nutritional status:          Consultants:  Cardiology/orthopedic   --------------------------------------------------------------------------------------------------------------------------------------------- DVT prophylaxis: Heparin subcu (Heparin Gtt discontinued) Code Status:   Code Status: Full Code Family Communication: No family member present at bedside- attempt will be made to update daily The above findings and plan of care has been discussed with patient (and family )  in detail,  they expressed understanding and agreement of above. -Advance care planning has been discussed.   Admission status:    Status is: Inpatient  Remains inpatient appropriate because:Inpatient level of care appropriate due to severity of illness   Dispo: The patient is from:  assisted living              Anticipated d/c is to: SNF              Anticipated d/c  date is:  SNF in 3-5 days               Patient currently is not medically stable to d/c.        Procedures:   No admission procedures for hospital encounter.   Antimicrobials:  Anti-infectives (From admission, onward)   Start     Dose/Rate Route Frequency Ordered Stop   01/26/20 1000  levofloxacin (LEVAQUIN) tablet 500 mg        500 mg Oral Every 24 hours 01/25/20 0902 01/30/20 0959   01/26/20 0900  ceFAZolin (ANCEF) IVPB 1 g/50 mL premix        1 g 100 mL/hr over 30 Minutes Intravenous Every 8 hours 01/26/20 0823      01/25/20 1000  levofloxacin (LEVAQUIN) tablet 500 mg  Status:  Discontinued        500 mg Oral Daily 01/25/20 0838 01/25/20 0902   01/24/20 1000  levofloxacin (LEVAQUIN) tablet 500 mg        500 mg Oral Daily 01/23/20 1126 01/25/20 0836   01/21/20 1530  ceFAZolin (ANCEF) IVPB 2g/100 mL premix  Status:  Discontinued        2 g 200 mL/hr over 30 Minutes Intravenous  Once 01/21/20 0738 01/21/20 1009   01/21/20 0730  azithromycin (ZITHROMAX) 500 mg in sodium chloride 0.9 % 250 mL IVPB  Status:  Discontinued        500 mg 250 mL/hr over 60 Minutes Intravenous Every 24 hours 01/21/20 0615 01/23/20 1123   01/21/20 0700  cefTRIAXone (ROCEPHIN) 1 g in sodium chloride 0.9 % 100 mL IVPB  Status:  Discontinued        1 g 200 mL/hr over 30 Minutes Intravenous Every 24 hours 01/21/20 0615 01/23/20 1123       Medication:  . alum & mag hydroxide-simeth  30 mL Oral TID AC & HS  . aspirin EC  81 mg Oral Daily  . atorvastatin  80 mg Oral Daily  . calcium-vitamin D  1 tablet Oral BID  . Chlorhexidine Gluconate Cloth  6 each Topical Q0600  . cholecalciferol  5,000 Units Oral Daily  . diltiazem  240 mg Oral Daily  . escitalopram  10 mg Oral Daily  . feeding supplement (ENSURE ENLIVE)  237 mL Oral TID BM  . ferrous sulfate  325 mg Oral BID WC  . guaiFENesin  600 mg Oral Daily  . ipratropium-albuterol  3 mL Nebulization TID  . isosorbide mononitrate  30 mg Oral Daily  . levofloxacin  500 mg Oral Q24H  . loratadine  10 mg Oral Daily  . methylPREDNISolone (SOLU-MEDROL) injection  30 mg Intravenous Daily  . metolazone  2.5 mg Oral BID  . mupirocin ointment  1 application Nasal BID  . pantoprazole  40 mg Oral Daily  . potassium chloride SA  40 mEq Oral Daily    acetaminophen **OR** acetaminophen, albuterol, dextromethorphan, diphenhydrAMINE, HYDROmorphone (DILAUDID) injection, loperamide, magnesium hydroxide, morphine injection, nitroGLYCERIN, ondansetron **OR** ondansetron (ZOFRAN) IV, tiZANidine,  traZODone   Objective:   Vitals:   01/25/20 1945 01/25/20 2211 01/26/20 0038 01/26/20 0755  BP:  118/64 118/70 (!) 112/57  Pulse:  (!) 102 100 83  Resp:  20 18 17   Temp:  97.9 F (36.6 C) 98 F (36.7 C) 97.9 F (36.6 C)  TempSrc:  Oral Oral Oral  SpO2: 93% 94% 90% 90%  Weight:      Height:        Intake/Output Summary (Last 24  hours) at 01/26/2020 1101 Last data filed at 01/25/2020 1500 Gross per 24 hour  Intake 0 ml  Output 0 ml  Net 0 ml   Filed Weights   01/22/20 0547 01/24/20 0538 01/25/20 0500  Weight: 56.2 kg 68.5 kg 73.9 kg     Examination:      Physical Exam:   General:  Alert, oriented, cooperative, no distress; deaf-mute, able to communicate with gestures  HEENT:  Normocephalic, PERRL, otherwise with in Normal limits   Neuro:  CNII-XII intact. , normal motor and sensation, reflexes intact   Lungs:   Clear to auscultation BL, Respirations unlabored, no wheezes / crackles  Cardio:    S1/S2, RRR, No murmure, No Rubs or Gallops   Abdomen:   Soft, non-tender, bowel sounds active all four quadrants,  no guarding or peritoneal signs.  Muscular skeletal:  Limited exam - in bed, able to move all 3extremities, Normal strength,  2+ pulses,  symmetric, No pitting edema, Right hip pain  prominently angulated right hip with pain and tenderness, limited range of motion due to pain  Skin:  Dry, warm to touch, negative for any Rashes, No open wounds  Wounds: Please see nursing documentation Pressure Injury 05/27/19 Ischial tuberosity Left Unstageable - Full thickness tissue loss in which the base of the injury is covered by slough (yellow, tan, gray, green or brown) and/or eschar (tan, brown or black) in the wound bed. (Active)  05/27/19 1630  Location: Ischial tuberosity  Location Orientation: Left  Staging: Unstageable - Full thickness tissue loss in which the base of the injury is covered by slough (yellow, tan, gray, green or brown) and/or eschar (tan, brown or black)  in the wound bed.  Wound Description (Comments):   Present on Admission: Yes                    ------------------------------------------------------------------------------------------------------------------------------------------    LABs:  CBC Latest Ref Rng & Units 01/26/2020 01/25/2020 01/24/2020  WBC 4.0 - 10.5 K/uL 20.9(H) 17.4(H) 10.0  Hemoglobin 12.0 - 15.0 g/dL 11.0(L) 10.0(L) 9.4(L)  Hematocrit 36 - 46 % 33.9(L) 29.2(L) 28.7(L)  Platelets 150 - 400 K/uL 465(H) 407(H) 283   CMP Latest Ref Rng & Units 01/26/2020 01/25/2020 01/24/2020  Glucose 70 - 99 mg/dL 811(B) 147(W) 295(A)  BUN 8 - 23 mg/dL 21(H) 15 12  Creatinine 0.44 - 1.00 mg/dL 0.86(V) 7.84 6.96  Sodium 135 - 145 mmol/L 136 134(L) 138  Potassium 3.5 - 5.1 mmol/L 4.0 3.6 3.5  Chloride 98 - 111 mmol/L 91(L) 99 101  CO2 22 - 32 mmol/L 31 27 28   Calcium 8.9 - 10.3 mg/dL 9.0 2.9(B) 2.8(U)  Total Protein 6.5 - 8.1 g/dL - - -  Total Bilirubin 0.3 - 1.2 mg/dL - - -  Alkaline Phos 38 - 126 U/L - - -  AST 15 - 41 U/L - - -  ALT 0 - 44 U/L - - -       Micro Results Recent Results (from the past 240 hour(s))  SARS Coronavirus 2 by RT PCR (hospital order, performed in [facility] hospital lab) Nasopharyngeal Nasopharyngeal Swab     Status: None   Collection Time: 01/21/20  2:53 AM   Specimen: Nasopharyngeal Swab  Result Value Ref Range Status   SARS Coronavirus 2 NEGATIVE NEGATIVE Final    Comment: (NOTE) SARS-CoV-2 target nucleic acids are NOT DETECTED.  The SARS-CoV-2 RNA is generally detectable in upper and lower respiratory specimens during the acute phase of  infection. The lowest concentration of SARS-CoV-2 viral copies this assay can detect is 250 copies / mL. A negative result does not preclude SARS-CoV-2 infection and should not be used as the sole basis for treatment or other patient management decisions.  A negative result may occur with improper specimen collection / handling, submission of specimen  other than nasopharyngeal swab, presence of viral mutation(s) within the areas targeted by this assay, and inadequate number of viral copies (<250 copies / mL). A negative result must be combined with clinical observations, patient history, and epidemiological information.  Fact Sheet for Patients:   BoilerBrush.com.cy  Fact Sheet for Healthcare Providers: https://pope.com/  This test is not yet approved or  cleared by the [place] FDA and has been authorized for detection and/or diagnosis of SARS-CoV-2 by FDA under an Emergency Use Authorization (EUA).  This EUA will remain in effect (meaning this test can be used) for the duration of the COVID-19 declaration under Section 564(b)(1) of the Act, 21 U.S.C. section 360bbb-3(b)(1), unless the authorization is terminated or revoked sooner.  Performed at [facility], [address] [place], [place] [address]   MRSA PCR Screening     Status: Abnormal   Collection Time: 01/22/20  5:15 PM   Specimen: Nasopharyngeal  Result Value Ref Range Status   MRSA by PCR POSITIVE (A) NEGATIVE Final    Comment:        The GeneXpert MRSA Assay (FDA approved for NASAL specimens only), is one component of a comprehensive MRSA colonization surveillance program. It is not intended to diagnose MRSA infection nor to guide or monitor treatment for MRSA infections. RESULT CALLED TO, READ BACK BY AND VERIFIED WITH: BEBRA EMBDEN 01/22/20 AT 2211 BY ACR Performed at [facility], [address], [place], [place] [address]     Radiology Reports CT Angio Chest PE W/Cm &/Or Wo Cm  Result Date: 01/21/2020 CLINICAL DATA:  Fall and shortness of breath. EXAM: CT ANGIOGRAPHY CHEST WITH CONTRAST TECHNIQUE: Multidetector CT imaging of the chest was performed using the standard protocol during bolus administration of intravenous contrast. Multiplanar CT image reconstructions and MIPs were  obtained to evaluate the vascular anatomy. CONTRAST:  75mL OMNIPAQUE IOHEXOL 350 MG/ML SOLN COMPARISON:  06/09/2019 FINDINGS: Cardiovascular: Prominent right ventricular size. No pericardial effusion. Aortic and great vessel atherosclerotic calcification. No pulmonary artery filling defect is seen. There is significant motion artifact at the lung bases. Mediastinum/Nodes: Small to moderate sliding hiatal hernia. No adenopathy. Lungs/Pleura: Opacity in the right lower lobe with volume loss. No edema, effusion, or pneumothorax. Panlobular emphysema. Dystrophic calcifications in the apical lungs. Bronchomalacia. Upper Abdomen: Atherosclerotic calcification. Musculoskeletal: Healing anterior and lateral left rib fractures. Review of the MIP images confirms the above findings. IMPRESSION: 1. Right lower lobe atelectasis versus pneumonia. 2. Moderately motion degraded chest CTA. No evidence of pulmonary embolism. 3. Advanced emphysema. Aortic Atherosclerosis (ICD10-I70.0) and Emphysema (ICD10-J43.9). Electronically Signed   By: Marnee Spring M.D.   On: 01/21/2020 05:55   CT Hip Right Wo Contrast  Result Date: 01/21/2020 CLINICAL DATA:  Hip trauma.  Fracture suspected. EXAM: CT OF THE RIGHT HIP WITHOUT CONTRAST TECHNIQUE: Multidetector CT imaging of the right hip was performed according to the standard protocol. Multiplanar CT image reconstructions were also generated. COMPARISON:  Hip x-rays from earlier same day. FINDINGS: Bones/Joint/Cartilage Linear lucency identified in the lateral cortex of the proximal right femoral diaphysis associated with focal cortical thickening. Imaging features compatible with nondisplaced stress fracture. The abnormality is included  on the extreme inferior aspect of this study and is best visualized on sagittal reconstruction image 21 of series 5. Degenerative changes are noted in the right hip. Ligaments Suboptimally assessed by CT. Muscles and Tendons No substantial hemorrhage noted  in the proximal thigh musculature. Soft tissues Distended urinary bladder is incompletely visualized. IMPRESSION: Linear lucency in the lateral cortex of the proximal right femoral diaphysis associated with focal cortical thickening. Imaging features compatible with nondisplaced stress fracture. Consider MRI of the proximal right femur for definitive characterization. Electronically Signed   By: Kennith Center M.D.   On: 01/21/2020 06:02   DG Chest Port 1 View  Result Date: 01/26/2020 CLINICAL DATA:  Shortness of breath. EXAM: PORTABLE CHEST 1 VIEW COMPARISON:  January 24, 2020 FINDINGS: Stably enlarged cardiac silhouette. Calcific atherosclerotic disease of the aorta. Upper lobe predominant moderate to severe emphysematous changes. Persistent patchy interstitial and alveolar opacities in bilateral lower lobes. No evidence of pneumothorax or pleural effusion. Stable chest wall deformity from prior trauma. IMPRESSION: 1. Persistent patchy interstitial and alveolar opacities in bilateral lower lobes may represent mixed pattern pulmonary edema or bronchopneumonia. 2. Moderate to severe emphysema. 3. Stably enlarged cardiac silhouette. Electronically Signed   By: Ted Mcalpine M.D.   On: 01/26/2020 09:15   DG Chest Port 1 View  Result Date: 01/24/2020 CLINICAL DATA:  COPD exacerbation. Pt presented 01/21/2020 to the emergency room with acute onset of fall at her assisted living facility with subsequent right hip pain and significant respiratory distress. Hx - COPD, HTN, non-smoker. EXAM: PORTABLE CHEST 1 VIEW COMPARISON:  01/21/2020 FINDINGS: Changes of emphysema skeletal lung scarring and interstitial thickening are stable. Opacity at the right lung base has mildly improved from the previous study. No new lung abnormalities. No convincing pleural effusion and no pneumothorax. Cardiac silhouette is normal in size. Multiple bilateral old rib fractures are stable. IMPRESSION: 1. Mild interval improvement.  Decreased opacity at the right lung base consistent with improved pneumonia or atelectasis. 2. No other change. Stable chronic findings including extensive emphysema. Electronically Signed   By: Amie Portland M.D.   On: 01/24/2020 10:43   DG Chest Portable 1 View  Result Date: 01/21/2020 CLINICAL DATA:  Larey Seat, short of breath, hypoxia EXAM: PORTABLE CHEST 1 VIEW COMPARISON:  06/07/2019 FINDINGS: Single frontal view of the chest demonstrates a stable cardiac silhouette. Extensive bullous emphysematous changes are seen, greatest at the right apex. Marked areas of scarring and fibrosis are seen throughout the lungs. Increased density at the right costophrenic angle may reflect consolidation and/or effusion. Chronic posttraumatic changes of the thoracic cage. No acute displaced fractures. IMPRESSION: 1. 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 2. Density at the right lateral lung base may reflect focal lung consolidation and/or small effusion. Electronically Signed   By: Sharlet Salina M.D.   On: 01/21/2020 03:48   ECHOCARDIOGRAM COMPLETE  Result Date: 01/21/2020    ECHOCARDIOGRAM REPORT   Patient Name:   [facility] Date of Exam: 01/21/2020 Medical Rec #:  [id number]     Height:       66.0 in Accession #:    [id number]    Weight:       123.2 lb Date of Birth:  03/03/1941      BSA:          1.628 m Patient Age:    79 years      BP:           116/63 mmHg Patient Gender:  F             HR:           74 bpm. Exam Location:  [facility] Procedure: 2D Echo, Cardiac Doppler and Color Doppler Indications:     NSTEMI 121.4  History:         Patient has no prior history of Echocardiogram examinations.                  COPD; Risk Factors:Hypertension.  Sonographer:     Cristela Blue RDCS (AE) Referring Phys:  [id number] Vernetta Honey MANSY Diagnosing Phys: Debbe Odea MD IMPRESSIONS  1. Left ventricular ejection fraction, by estimation, is 60 to 65%. The left ventricle has normal function. The left ventricle  has no regional wall motion abnormalities. Left ventricular diastolic parameters are consistent with Grade I diastolic dysfunction (impaired relaxation).  2. Right ventricular systolic function is normal. The right ventricular size is normal.  3. Left atrial size was moderately dilated.  4. The mitral valve is normal in structure. No evidence of mitral valve regurgitation. No evidence of mitral stenosis.  5. The aortic valve is normal in structure. Aortic valve regurgitation is not visualized. No aortic stenosis is present.  6. The inferior vena cava is normal in size with greater than 50% respiratory variability, suggesting right atrial pressure of 3 mmHg. FINDINGS  Left Ventricle: Left ventricular ejection fraction, by estimation, is 60 to 65%. The left ventricle has normal function. The left ventricle has no regional wall motion abnormalities. The left ventricular internal cavity size was normal in size. There is  no left ventricular hypertrophy. Left ventricular diastolic parameters are consistent with Grade I diastolic dysfunction (impaired relaxation). Right Ventricle: The right ventricular size is normal. No increase in right ventricular wall thickness. Right ventricular systolic function is normal. Left Atrium: Left atrial size was moderately dilated. Right Atrium: Right atrial size was normal in size. Pericardium: There is no evidence of pericardial effusion. Mitral Valve: The mitral valve is normal in structure. Normal mobility of the mitral valve leaflets. No evidence of mitral valve regurgitation. No evidence of mitral valve stenosis. Tricuspid Valve: The tricuspid valve is normal in structure. Tricuspid valve regurgitation is not demonstrated. No evidence of tricuspid stenosis. Aortic Valve: The aortic valve is normal in structure. Aortic valve regurgitation is not visualized. No aortic stenosis is present. Aortic valve mean gradient measures 3.5 mmHg. Aortic valve peak gradient measures 6.5 mmHg.  Aortic valve area, by VTI measures 2.46 cm. Pulmonic Valve: The pulmonic valve was not well visualized. Pulmonic valve regurgitation is not visualized. No evidence of pulmonic stenosis. Aorta: The aortic root is normal in size and structure. Venous: The inferior vena cava was not well visualized. The inferior vena cava is normal in size with greater than 50% respiratory variability, suggesting right atrial pressure of 3 mmHg. IAS/Shunts: No atrial level shunt detected by color flow Doppler.  LEFT VENTRICLE PLAX 2D LVIDd:         4.77 cm  Diastology LVIDs:         3.08 cm  LV e' lateral:   7.83 cm/s LV PW:         1.15 cm  LV E/e' lateral: 11.6 LV IVS:        1.11 cm  LV e' medial:    5.87 cm/s LVOT diam:     2.00 cm  LV E/e' medial:  15.5 LV SV:         68 LV SV Index:  42 LVOT Area:     3.14 cm  RIGHT VENTRICLE RV Basal diam:  3.63 cm RV S prime:     12.40 cm/s TAPSE (M-mode): 3.6 cm LEFT ATRIUM             Index       RIGHT ATRIUM           Index LA diam:        4.80 cm 2.95 cm/m  RA Area:     15.10 cm LA Vol (A2C):   83.7 ml 51.42 ml/m RA Volume:   35.80 ml  21.99 ml/m LA Vol (A4C):   96.5 ml 59.28 ml/m LA Biplane Vol: 91.0 ml 55.90 ml/m  AORTIC VALVE                   PULMONIC VALVE AV Area (Vmax):    2.25 cm    PV Vmax:        0.92 m/s AV Area (Vmean):   2.51 cm    PV Peak grad:   3.4 mmHg AV Area (VTI):     2.46 cm    RVOT Peak grad: 5 mmHg AV Vmax:           127.00 cm/s AV Vmean:          86.450 cm/s AV VTI:            0.277 m AV Peak Grad:      6.5 mmHg AV Mean Grad:      3.5 mmHg LVOT Vmax:         90.90 cm/s LVOT Vmean:        69.000 cm/s LVOT VTI:          0.217 m LVOT/AV VTI ratio: 0.78  AORTA Ao Root diam: 3.50 cm MITRAL VALVE                TRICUSPID VALVE MV Area (PHT): 3.34 cm     TR Peak grad:   30.7 mmHg MV Decel Time: 227 msec     TR Vmax:        277.00 cm/s MV E velocity: 91.20 cm/s MV A velocity: 137.00 cm/s  SHUNTS MV E/A ratio:  0.67         Systemic VTI:  0.22 m                              Systemic Diam: 2.00 cm Debbe Odea MD Electronically signed by Debbe Odea MD Signature Date/Time: 01/21/2020/4:26:00 PM    Final    DG HIP UNILAT WITH PELVIS 2-3 VIEWS RIGHT  Result Date: 01/25/2020 CLINICAL DATA:  Dislocated hip, right, initial encounter. Worsening right hip pain. EXAM: DG HIP (WITH OR WITHOUT PELVIS) 2-3V RIGHT COMPARISON:  Radiograph and CT 01/21/2020, radiograph 07/04/2019 FINDINGS: The previous incomplete subtrochanteric fracture of the right hip is now a complete fracture, displaced and significantly angulated with apex anterior angulation of 79 degrees. Fracture may be bisphosphonate related with cortical thickening noted on February 2021 exam. The femoral head remains seated in the acetabulum without dislocation. Moderate right hip osteoarthritis. Pubic rami are intact. IMPRESSION: 1. The previous incomplete subtrochanteric fracture is now a complete fracture, displaced with significant angulation. 2. Femoral head is well seated in the acetabulum without dislocation. Electronically Signed   By: Narda Rutherford M.D.   On: 01/25/2020 17:15   DG Hip Unilat W or Wo Pelvis 2-3 Views Right  Result  Date: 01/21/2020 CLINICAL DATA:  Larey Seat, pain EXAM: DG HIP (WITH OR WITHOUT PELVIS) 2-3V RIGHT COMPARISON:  01/31/2017 FINDINGS: Frontal view of the pelvis as well as frontal and cross-table lateral views of the right hip are obtained. There is a nondisplaced transverse fracture through the proximal femoral diaphysis. Alignment is anatomic. There are no other acute displaced fractures. The hips are well aligned. Mild bilateral hip osteoarthritis. Sacroiliac joints are normal. IMPRESSION: 1. Nondisplaced transverse fracture through the proximal right femoral diaphysis. Fracture line involves an area of chronic focal cortical thickening, and likely reflects an acute fracture at a site of chronic stress. Electronically Signed   By: Sharlet Salina M.D.   On: 01/21/2020 03:46     SIGNED: Kendell Bane, MD, FACP, FHM. [facility],  Pager (please use [email or web address] to page/text)  If 7PM-7AM, please contact night-coverage [email or web address], Password [facility] 01/26/2020, 11:01 AM

## 2020-01-26 NOTE — Transfer of Care (Signed)
Immediate Anesthesia Transfer of Care Note  Patient: Alice Wallace  Procedure(s) Performed: Right Hip IM nail with TFNA (Right )  Patient Location: PACU  Anesthesia Type:General  Level of Consciousness: awake and sedated  Airway & Oxygen Therapy: Patient Spontanous Breathing and Patient connected to face mask oxygen  Post-op Assessment: Report given to RN and Post -op Vital signs reviewed and stable  Post vital signs: Reviewed and stable  Last Vitals:  Vitals Value Taken Time  BP 106/56 01/26/20 1819  Temp    Pulse 87 01/26/20 1825  Resp 30 01/26/20 1825  SpO2 78 % 01/26/20 1825  Vitals shown include unvalidated device data.  Last Pain:  Vitals:   01/26/20 0755  TempSrc: Oral  PainSc:          Complications: No complications documented.

## 2020-01-26 NOTE — Anesthesia Postprocedure Evaluation (Addendum)
Anesthesia Post Note  Patient: Alice Wallace  Procedure(s) Performed: Right Hip IM nail with TFNA (Right )  Patient location during evaluation: PACU Anesthesia Type: General Level of consciousness: awake Pain management: pain level controlled Vital Signs Assessment: post-procedure vital signs reviewed and stable Respiratory status: spontaneous breathing and respiratory function stable (Patient is on BiPAP) Cardiovascular status: blood pressure returned to baseline and stable Postop Assessment: no apparent nausea or vomiting Anesthetic complications: no Comments: Patient had poor respiratory function prior to undergoing surgery as she initially presented with a COPD exacerbation among other things.  She was able to be weaned during her stay, but post surgery is having an increased requirement.  I think that this is at least partially due to still having some effects of the general anesthesia in combination with her already poor baseline function.  She was doing very well on BiPAP, maintaining her O2 saturation, and was felt ready to be discharged from the PACU.   No complications documented.   Last Vitals:  Vitals:   01/26/20 2019 01/26/20 2054  BP: (!) 94/57 (!) 100/51  Pulse: 85 86  Resp: (!) 24 19  Temp: (!) 36.1 C (!) 36.4 C  SpO2: 99% 99%    Last Pain:  Vitals:   01/26/20 2054  TempSrc: Oral  PainSc:                  Lenard Simmer

## 2020-01-26 NOTE — Progress Notes (Signed)
[facility] Cardiology  SUBJECTIVE:  Patient laying in bed, according to the nurse, she has not experienced any chest pain or worsening shortness of breath (patient is a deaf mute)   Vitals:   01/25/20 1945 01/25/20 2211 01/26/20 0038 01/26/20 0755  BP:  118/64 118/70 (!) 112/57  Pulse:  (!) 102 100 83  Resp:  20 18 17   Temp:  97.9 F (36.6 C) 98 F (36.7 C) 97.9 F (36.6 C)  TempSrc:  Oral Oral Oral  SpO2: 93% 94% 90% 90%  Weight:      Height:         Intake/Output Summary (Last 24 hours) at 01/26/2020 1031 Last data filed at 01/25/2020 1500 Gross per 24 hour  Intake 0 ml  Output 0 ml  Net 0 ml      PHYSICAL EXAM  General: Well developed, well nourished, in no acute distress HEENT:  Normocephalic and atramatic Neck:  No JVD.  Lungs: Clear bilaterally to auscultation and percussion. Heart: HRRR . Normal S1 and S2 without gallops or murmurs.  Abdomen: Bowel sounds are positive, abdomen soft and non-tender  Msk:  Back normal, normal gait. Normal strength and tone for age. Extremities: No clubbing, cyanosis or edema.   Neuro: Alert and oriented X 3. Psych:  Good affect, responds appropriately   LABS: Basic Metabolic Panel: Recent Labs    01/25/20 0401 01/26/20 0425  NA 134* 136  K 3.6 4.0  CL 99 91*  CO2 27 31  GLUCOSE 118* 137*  BUN 15 26*  CREATININE 0.79 1.31*  CALCIUM 8.7* 9.0   Liver Function Tests: No results for input(s): AST, ALT, ALKPHOS, BILITOT, PROT, ALBUMIN in the last 72 hours. No results for input(s): LIPASE, AMYLASE in the last 72 hours. CBC: Recent Labs    01/25/20 0401 01/26/20 0425  WBC 17.4* 20.9*  HGB 10.0* 11.0*  HCT 29.2* 33.9*  MCV 88.0 91.6  PLT 407* 465*   Cardiac Enzymes: No results for input(s): CKTOTAL, CKMB, CKMBINDEX, TROPONINI in the last 72 hours. BNP: Invalid input(s): POCBNP D-Dimer: No results for input(s): DDIMER in the last 72 hours. Hemoglobin A1C: No results for input(s): HGBA1C in the last 72 hours. Fasting  Lipid Panel: No results for input(s): CHOL, HDL, LDLCALC, TRIG, CHOLHDL, LDLDIRECT in the last 72 hours. Thyroid Function Tests: No results for input(s): TSH, T4TOTAL, T3FREE, THYROIDAB in the last 72 hours.  Invalid input(s): FREET3 Anemia Panel: No results for input(s): VITAMINB12, FOLATE, FERRITIN, TIBC, IRON, RETICCTPCT in the last 72 hours.  DG Chest Port 1 View  Result Date: 01/26/2020 CLINICAL DATA:  Shortness of breath. EXAM: PORTABLE CHEST 1 VIEW COMPARISON:  January 24, 2020 FINDINGS: Stably enlarged cardiac silhouette. Calcific atherosclerotic disease of the aorta. Upper lobe predominant moderate to severe emphysematous changes. Persistent patchy interstitial and alveolar opacities in bilateral lower lobes. No evidence of pneumothorax or pleural effusion. Stable chest wall deformity from prior trauma. IMPRESSION: 1. Persistent patchy interstitial and alveolar opacities in bilateral lower lobes may represent mixed pattern pulmonary edema or bronchopneumonia. 2. Moderate to severe emphysema. 3. Stably enlarged cardiac silhouette. Electronically Signed   By: January 26, 2020 M.D.   On: 01/26/2020 09:15   DG HIP UNILAT WITH PELVIS 2-3 VIEWS RIGHT  Result Date: 01/25/2020 CLINICAL DATA:  Dislocated hip, right, initial encounter. Worsening right hip pain. EXAM: DG HIP (WITH OR WITHOUT PELVIS) 2-3V RIGHT COMPARISON:  Radiograph and CT 01/21/2020, radiograph 07/04/2019 FINDINGS: The previous incomplete subtrochanteric fracture of the right hip is now  a complete fracture, displaced and significantly angulated with apex anterior angulation of 79 degrees. Fracture may be bisphosphonate related with cortical thickening noted on February 2021 exam. The femoral head remains seated in the acetabulum without dislocation. Moderate right hip osteoarthritis. Pubic rami are intact. IMPRESSION: 1. The previous incomplete subtrochanteric fracture is now a complete fracture, displaced with significant  angulation. 2. Femoral head is well seated in the acetabulum without dislocation. Electronically Signed   By: Narda Rutherford M.D.   On: 01/25/2020 17:15     Echo of EF 60%  TELEMETRY: Sinus rhythm:  ASSESSMENT AND PLAN:  Active Problems:   Acute respiratory failure with hypoxia (HCC)   COPD exacerbation (HCC)   Non-STEMI (non-ST elevated myocardial infarction) (HCC)    1. NSTEMI, treated medically, heparin discontinued 01/23/2020 with recurrent chest pain. Repeat ECGshows ST elevation in lead 1 without meeting STEMI criteria,and troponintrending down, then back up without recurrent chest pain (245, 1281, 2270, 1360, 4566). Patient reports feeling better with nitroglycerin.2D echocardiogram revealed normal left ventricular function without wall motion abnormalities. 2.Chronic diastolic CHF, BNP elevated to 982 this morning, chest xray negative for acute abnormality. Denies shortness of breath at rest, no lower extremity edema or JVD. On oral Lasix, switched to IV Lasix 20 mg today. 3. Acute on chronic respiratory failure with COPD exacerbationand possible pneumonia, on supplemental O2 which has been weaned to 3L, IV steroids, and antibiotics 4.  Essential hypertension, blood pressure well controlled on current BP medications 5.  Preoperative cardiovascular evaluation, moderate to high risk for cardiovascular complication in light of patient's advanced age and comorbidities, but requires urgent trochanteric femoral nailing  Recommendations  1.  Agree with current therapy 2.  Continue isosorbide mononitrate 30 mg daily 3.  Continue diuresis 4.  Carefully monitor renal status 5.  Defer cardiac catheterization 6.  Proceed with surgery as deemed necessary 7.  Defer further cardiac diagnostics     Marcina Millard, MD, PhD, [facility], [place] 01/26/2020 10:31 AM

## 2020-01-26 NOTE — Anesthesia Procedure Notes (Signed)
Procedure Name: LMA Insertion Date/Time: 01/26/2020 4:12 PM Performed by: Henrietta Hoover, CRNA Pre-anesthesia Checklist: Patient identified, Patient being monitored, Timeout performed, Emergency Drugs available and Suction available Patient Re-evaluated:Patient Re-evaluated prior to induction Oxygen Delivery Method: Circle system utilized Preoxygenation: Pre-oxygenation with 100% oxygen Induction Type: IV induction Ventilation: Mask ventilation without difficulty LMA: LMA inserted LMA Size: 4.0 Tube type: Oral Number of attempts: 1 Placement Confirmation: positive ETCO2 and breath sounds checked- equal and bilateral Tube secured with: Tape Dental Injury: Teeth and Oropharynx as per pre-operative assessment

## 2020-01-26 NOTE — Op Note (Signed)
DATE OF SURGERY:  01/26/2020  TIME: 6:14 PM  PATIENT NAME:  Alice Wallace  AGE: 79 y.o.  PRE-OPERATIVE DIAGNOSIS:  Right femur fracture  POST-OPERATIVE DIAGNOSIS:  SAME  PROCEDURE:  Right Hip IM nail with TFNA  SURGEON:  Lyndle Herrlich  EBL:  50 cc  COMPLICATIONS:  None apparent  OPERATIVE IMPLANTS: Synthes trochanteric femoral nail  380 mm by 9 mm  with interlocking helical blade  95 mm, 44 mm distal lock  PREOPERATIVE INDICATIONS:  [name] is a 79 y.o. year old who fell and suffered a hip fracture. She was brought into the ER and then admitted and optimized and then failed conservative management and elected for surgical intervention.    The risks benefits and alternatives were discussed with the patient including but not limited to the risks of nonoperative treatment, versus surgical intervention including infection, bleeding, nerve injury, malunion, nonunion, hardware prominence, hardware failure, need for hardware removal, blood clots, cardiopulmonary complications, morbidity, mortality, among others, and they were willing to proceed.    OPERATIVE PROCEDURE:  The patient was brought to the operating room and placed in the supine position.  General anesthesia was administered. She was placed on the fracture table.  Closed reduction was performed under C-arm guidance.  The femur was locked in 90 degrees of angulation with significant muscle contracture. Significant rotation and traction was applied to restore alignment. The length of the femur was also measured using fluoroscopy. Time out was then performed after sterile prep and drape. She received preoperative antibiotics.  Incision was made proximal to the greater trochanter. A guidewire was placed in the appropriate position. Confirmation was made on AP and lateral views. The above-named nail was opened. I opened the proximal femur with a reamer. I then placed the nail by hand easily down. I did not need to ream the  femur.  Once the nail was completely seated, I placed a guidepin into the femoral head into the center center position through a second incision.  I measured the length, and then reamed the lateral cortex and up into the head. I then placed the helical blade. Slight compression was applied. Bone quality was poor.  I then secured the proximal interlock.  Next using a perfect circle technique a single distal screw was placed to control rotation.  I then removed the instruments, and took final C-arm pictures AP and lateral the entire length of the leg. Anatomic reconstruction was achieved, and the wounds were irrigated copiously and closed with Vicryl  followed by staples and dry sterile dressing. Sponge and needle count were correct.   The patient was awakened and returned to PACU in stable and satisfactory condition. There no complications and the patient tolerated the procedure well.  She will be weightbearing as tolerated.    Lyndle Herrlich

## 2020-01-26 NOTE — H&P (Signed)
The patient has been re-examined, and the chart reviewed, patient has a displaced right proximal femur fracture with angulation and displacement. Plan a right hip trochanteric femoral nail.  Anesthesia is not consulted regarding a peripheral nerve block for post-operative pain.  The risks, benefits, and alternatives have been discussed at length, and the patient is willing to proceed.

## 2020-01-26 NOTE — Progress Notes (Signed)
PT Cancellation Note  Patient Details Name: Alice Wallace MRN: [id number] DOB: 07-Dec-1940   Cancelled Treatment:    Reason Eval/Treat Not Completed: Other (comment): Will hold PT this date secondary to planned trochanteric femoral nailing surgery this date.  Will attempt to see pt at a future date following surgery as medically appropriate.     Ovidio Hanger PT, DPT 01/26/20, 8:42 AM

## 2020-01-26 NOTE — Anesthesia Preprocedure Evaluation (Addendum)
Anesthesia Evaluation  Patient identified by MRN, date of birth, ID band Patient awake    Reviewed: Allergy & Precautions, H&P , NPO status , Patient's Chart, lab work & pertinent test results, reviewed documented beta blocker date and time   History of Anesthesia Complications Negative for: history of anesthetic complications  Airway Mallampati: II  TM Distance: >3 FB Neck ROM: full    Dental   Pulmonary shortness of breath and Long-Term Oxygen Therapy, COPD,  COPD inhaler, neg recent URI, former smoker,    + rhonchi  + decreased breath sounds      Cardiovascular Exercise Tolerance: Good hypertension, (-) angina+ CAD and + Past MI  (-) Cardiac Stents Normal cardiovascular exam(-) dysrhythmias (-) Valvular Problems/Murmurs Rhythm:regular Rate:Normal     Neuro/Psych negative neurological ROS  negative psych ROS   GI/Hepatic negative GI ROS, Neg liver ROS,   Endo/Other  negative endocrine ROS  Renal/GU negative Renal ROS  negative genitourinary   Musculoskeletal   Abdominal   Peds  Hematology negative hematology ROS (+)   Anesthesia Other Findings Past Medical History: No date: COPD (chronic obstructive pulmonary disease) (HCC) No date: Deaf No date: Hypertension No date: Osteoporosis   Reproductive/Obstetrics negative OB ROS                            Anesthesia Physical Anesthesia Plan  ASA: III  Anesthesia Plan: General   Post-op Pain Management:    Induction: Intravenous  PONV Risk Score and Plan: 3 and Ondansetron, Dexamethasone and Treatment may vary due to age or medical condition  Airway Management Planned: LMA and Oral ETT  Additional Equipment:   Intra-op Plan:   Post-operative Plan: Extubation in OR  Informed Consent: I have reviewed the patients History and Physical, chart, labs and discussed the procedure including the risks, benefits and alternatives for the  proposed anesthesia with the patient or authorized representative who has indicated his/her understanding and acceptance.     Dental Advisory Given  Plan Discussed with: Anesthesiologist, CRNA and Surgeon  Anesthesia Plan Comments:         Anesthesia Quick Evaluation

## 2020-01-27 ENCOUNTER — Encounter: Payer: Self-pay | Admitting: Orthopedic Surgery

## 2020-01-27 ENCOUNTER — Inpatient Hospital Stay: Payer: Medicare Other

## 2020-01-27 DIAGNOSIS — S72009A Fracture of unspecified part of neck of unspecified femur, initial encounter for closed fracture: Secondary | ICD-10-CM

## 2020-01-27 LAB — BLOOD GAS, ARTERIAL
Acid-Base Excess: 7.1 mmol/L — ABNORMAL HIGH (ref 0.0–2.0)
Bicarbonate: 31.3 mmol/L — ABNORMAL HIGH (ref 20.0–28.0)
FIO2: 100
O2 Saturation: 97.5 %
Patient temperature: 37
pCO2 arterial: 42 mmHg (ref 32.0–48.0)
pH, Arterial: 7.48 — ABNORMAL HIGH (ref 7.350–7.450)
pO2, Arterial: 90 mmHg (ref 83.0–108.0)

## 2020-01-27 LAB — BASIC METABOLIC PANEL
Anion gap: 10 (ref 5–15)
BUN: 43 mg/dL — ABNORMAL HIGH (ref 8–23)
CO2: 30 mmol/L (ref 22–32)
Calcium: 8.1 mg/dL — ABNORMAL LOW (ref 8.9–10.3)
Chloride: 93 mmol/L — ABNORMAL LOW (ref 98–111)
Creatinine, Ser: 1.14 mg/dL — ABNORMAL HIGH (ref 0.44–1.00)
GFR calc Af Amer: 53 mL/min — ABNORMAL LOW (ref >60)
GFR calc non Af Amer: 46 mL/min — ABNORMAL LOW (ref >60)
Glucose, Bld: 146 mg/dL — ABNORMAL HIGH (ref 70–99)
Potassium: 4.1 mmol/L (ref 3.5–5.1)
Sodium: 133 mmol/L — ABNORMAL LOW (ref 135–145)

## 2020-01-27 LAB — PROCALCITONIN: Procalcitonin: 0.29 ng/mL

## 2020-01-27 LAB — URINALYSIS, ROUTINE W REFLEX MICROSCOPIC
Bilirubin Urine: NEGATIVE
Glucose, UA: NEGATIVE mg/dL
Ketones, ur: NEGATIVE mg/dL
Nitrite: NEGATIVE
Protein, ur: 30 mg/dL — AB
Specific Gravity, Urine: 1.017 (ref 1.005–1.030)
pH: 5 (ref 5.0–8.0)

## 2020-01-27 LAB — CBC
HCT: 28.3 % — ABNORMAL LOW (ref 36.0–46.0)
Hemoglobin: 9.7 g/dL — ABNORMAL LOW (ref 12.0–15.0)
MCH: 31.2 pg (ref 26.0–34.0)
MCHC: 34.3 g/dL (ref 30.0–36.0)
MCV: 91 fL (ref 80.0–100.0)
Platelets: 361 10*3/uL (ref 150–400)
RBC: 3.11 MIL/uL — ABNORMAL LOW (ref 3.87–5.11)
RDW: 14.7 % (ref 11.5–15.5)
WBC: 32.3 10*3/uL — ABNORMAL HIGH (ref 4.0–10.5)
nRBC: 0.1 % (ref 0.0–0.2)

## 2020-01-27 LAB — LACTIC ACID, PLASMA
Lactic Acid, Venous: 2.3 mmol/L (ref 0.5–1.9)
Lactic Acid, Venous: 2 mmol/L (ref 0.5–1.9)

## 2020-01-27 MED ORDER — VANCOMYCIN HCL 1750 MG/350ML IV SOLN
1750.0000 mg | Freq: Once | INTRAVENOUS | Status: AC
  Administered 2020-01-27: 1750 mg via INTRAVENOUS
  Filled 2020-01-27 (×2): qty 350

## 2020-01-27 MED ORDER — FUROSEMIDE 10 MG/ML IJ SOLN
20.0000 mg | Freq: Two times a day (BID) | INTRAMUSCULAR | Status: DC
  Administered 2020-01-27 – 2020-01-29 (×5): 20 mg via INTRAVENOUS
  Filled 2020-01-27 (×5): qty 2

## 2020-01-27 MED ORDER — LACTATED RINGERS IV SOLN: INTRAVENOUS | Status: DC

## 2020-01-27 MED ORDER — LACTATED RINGERS IV BOLUS
1000.0000 mL | Freq: Once | INTRAVENOUS | Status: AC
  Administered 2020-01-27: 1000 mL via INTRAVENOUS

## 2020-01-27 MED ORDER — METOPROLOL SUCCINATE ER 25 MG PO TB24
25.0000 mg | ORAL_TABLET | Freq: Every day | ORAL | Status: DC
  Administered 2020-01-27 – 2020-02-01 (×4): 25 mg via ORAL
  Filled 2020-01-27 (×5): qty 1

## 2020-01-27 MED ORDER — RISAQUAD PO CAPS
1.0000 | ORAL_CAPSULE | Freq: Three times a day (TID) | ORAL | Status: DC
  Administered 2020-01-27 – 2020-02-03 (×22): 1 via ORAL
  Filled 2020-01-27 (×22): qty 1

## 2020-01-27 MED ORDER — CLOPIDOGREL BISULFATE 75 MG PO TABS
75.0000 mg | ORAL_TABLET | Freq: Every day | ORAL | Status: DC
  Administered 2020-01-27 – 2020-02-03 (×8): 75 mg via ORAL
  Filled 2020-01-27 (×8): qty 1

## 2020-01-27 MED ORDER — ENOXAPARIN SODIUM 40 MG/0.4ML ~~LOC~~ SOLN
40.0000 mg | SUBCUTANEOUS | Status: DC
  Administered 2020-01-27 – 2020-02-02 (×7): 40 mg via SUBCUTANEOUS
  Filled 2020-01-27 (×8): qty 0.4

## 2020-01-27 MED ORDER — DILTIAZEM HCL ER COATED BEADS 180 MG PO CP24
180.0000 mg | ORAL_CAPSULE | Freq: Every day | ORAL | Status: DC
  Administered 2020-01-27 – 2020-02-01 (×5): 180 mg via ORAL
  Filled 2020-01-27 (×5): qty 1

## 2020-01-27 MED ORDER — MIDODRINE HCL 5 MG PO TABS
5.0000 mg | ORAL_TABLET | Freq: Three times a day (TID) | ORAL | Status: DC
  Administered 2020-01-27 – 2020-02-01 (×14): 5 mg via ORAL
  Filled 2020-01-27 (×14): qty 1

## 2020-01-27 MED ORDER — VANCOMYCIN HCL 750 MG/150ML IV SOLN
750.0000 mg | Freq: Two times a day (BID) | INTRAVENOUS | Status: DC
  Administered 2020-01-28 – 2020-01-29 (×4): 750 mg via INTRAVENOUS
  Filled 2020-01-27 (×7): qty 150

## 2020-01-27 MED ORDER — SODIUM CHLORIDE 0.9 % IV SOLN
2.0000 g | Freq: Two times a day (BID) | INTRAVENOUS | Status: DC
  Administered 2020-01-27 – 2020-01-29 (×6): 2 g via INTRAVENOUS
  Filled 2020-01-27 (×10): qty 2

## 2020-01-27 NOTE — Progress Notes (Signed)
CRITICAL VALUE ALERT  Critical Value:  Lactic acid 2.3  Date & Time Notied:  9/7, 1110  Provider Notified: Shahmehdi  Orders Received/Actions taken: LR bolus

## 2020-01-27 NOTE — Progress Notes (Signed)
After further evaluation and discussion with [facility] Mr Alice Wallace [employer] of [employer] Serv[employer], based on clinic evidence of multi organ failure with very poor prognosis, with acute and severe respiratory failure and hypoxia, a telephone consent was obtained from  Mr Alice Wallace, that patient is now a DNR/DNI. [name] and myself were involved in a 3 way call to establish and confirm the verbal/ telephone consent.  The understanding is that patient will also be made comfort Care if patient continues to decline. Will Have Palliative care services follow up with patient to ensure comfort measures if needed.

## 2020-01-27 NOTE — Progress Notes (Addendum)
CRITICAL CARE NOTE CONSULTED FOR RESP FAILURE   CC  follow up respiratory failure  HPI 79 y.o. Caucasian female with a known history of COPD, hypertension, deafness and muteness, presented to the emergency room with acute onset of fall at her assisted living facility with subsequent right hip pain and significant respiratory distress.   Her pulse currently was down to the 70s on her 2 L O2 by nasal cannula with associated coarse breath sounds and wheezing. She was increased to 6 L/min for a pulse ox to reach the 90s. She had not have any reported chest pain or palpitations, fever or chills or nausea or vomiting. She was thought to be in COPD exacerbation was given IV Solu-Medrol and nebulized albuterol as well as duo nebs by EMS on route to the hospital. She can read lips and written communications.  Upon presentation to the emergency room, heart rate was 106 and respiratory rate 23 with pulse currently 76% on room airand 93-96% on 6 L of O2 by nasal cannula. Labs revealed mild hyponatremia and hypochloremia and a BUN of 31, BNP of 191.6, high-sensitivity troponin I of 245 mL 1281 and CBC showed leukocytosis of 10.8 as well as anemia. COVID-19 PCR came back negative. EKG showed mild ectopic atrial tachycardia with rate 0.1 with incomplete right bundle branch block left anterior fascicular block and LVH with T wave inversion laterally.   chest CTA that ruled out PE and showed right lower lobe atelectasis and a head   CT scan since suspicious for nondisplaced stress fracture of the proximal right femoral diaphysis with consideration for MRI      Postop day #1 Status post right hip open reduction internal fixation.  Tolerated procedure well, postprocedure patient remained in respiratory distress, could not tolerate BiPAP overnight, has been placed on high flow oxygen.Vanita Panda 92% FiO2 70%, flow rate 45 ABG 7.4 8/42/90/31 0.3  The patient was seen and examined, mildly lethargic, O2  demand has increased on high flow oxygen. Patient O2 sats mid-low 80s on heated HFNC 40L, 55%, RT increased to 40L, 70%. using accessory muscles to breathe   Patient with progressive resp failure and cardiac failrue with renal failure, DX of NSTEMI  I have relayed this information to [facility]  [place] state representative, she is in grave prognosis. I have addressed CODE STATUS AND Bernerd Limbo has understood that patient is not doing well and agrees for DNR/DNI status.   She will email me forms to fill out and relay them to her admin for final approval.   AT this time, patient is struggling to breathe,      BP (!) 96/51 (BP Location: Right Arm)   Pulse 60   Temp 98 F (36.7 C) (Oral)   Resp 18   Ht 5\' 6"  (1.676 m)   Wt 73.9 kg   SpO2 93%   BMI 26.31 kg/m    I/O last 3 completed shifts: In: 1300.7 [I.V.:1201.5; IV Piggyback:99.2] Out: 150 [Urine:100; Blood:50] Total I/O In: 1037.2 [P.O.:237; I.V.:800.2] Out: -   SpO2: 93 % O2 Flow Rate (L/min): 40 L/min FiO2 (%): (S) 70 %  Estimated body mass index is 26.31 kg/m as calculated from the following:   Height as of this encounter: 5\' 6"  (1.676 m).   Weight as of this encounter: 73.9 kg.  LIMITED ROS, grasps RT hip in pain and chest for SOB Patient is deaf, mute  PATIENT IS UNABLE TO PROVIDE COMPLETE REVIEW OF SYSTEMS deafness and mute  Pressure Injury 05/27/19 Ischial tuberosity Left Unstageable - Full thickness tissue loss in which the base of the injury is covered by slough (yellow, tan, gray, green or brown) and/or eschar (tan, brown or black) in the wound bed. (Active)  05/27/19 1630  Location: Ischial tuberosity  Location Orientation: Left  Staging: Unstageable - Full thickness tissue loss in which the base of the injury is covered by slough (yellow, tan, gray, green or brown) and/or eschar (tan, brown or black) in the wound bed.  Wound Description (Comments):   Present on Admission: Yes       PHYSICAL EXAMINATION:  GENERAL:critically ill appearing, +resp distress HEAD: Normocephalic, atraumatic.  EYES: Pupils equal, round, reactive to light.  No scleral icterus.  MOUTH: Moist mucosal membrane. NECK: Supple.  PULMONARY: +rhonchi, +wheezing CARDIOVASCULAR: S1 and S2. Regular rate and rhythm. No murmurs, rubs, or gallops.  GASTROINTESTINAL: Soft, nontender, -distended.  Positive bowel sounds.   MUSCULOSKELETAL: + edema.  NEUROLOGIC:alert  SKIN:intact,warm,dry  MEDICATIONS: I have reviewed all medications and confirmed regimen as documented   CULTURE RESULTS   Recent Results (from the past 240 hour(s))  SARS Coronavirus 2 by RT PCR (hospital order, performed in [facility] hospital lab) Nasopharyngeal Nasopharyngeal Swab     Status: None   Collection Time: 01/21/20  2:53 AM   Specimen: Nasopharyngeal Swab  Result Value Ref Range Status   SARS Coronavirus 2 NEGATIVE NEGATIVE Final    Comment: (NOTE) SARS-CoV-2 target nucleic acids are NOT DETECTED.  The SARS-CoV-2 RNA is generally detectable in upper and lower respiratory specimens during the acute phase of infection. The lowest concentration of SARS-CoV-2 viral copies this assay can detect is 250 copies / mL. A negative result does not preclude SARS-CoV-2 infection and should not be used as the sole basis for treatment or other patient management decisions.  A negative result may occur with improper specimen collection / handling, submission of specimen other than nasopharyngeal swab, presence of viral mutation(s) within the areas targeted by this assay, and inadequate number of viral copies (<250 copies / mL). A negative result must be combined with clinical observations, patient history, and epidemiological information.  Fact Sheet for Patients:   BoilerBrush.com.cy  Fact Sheet for Healthcare Providers: https://pope.com/  This test is not yet approved or   cleared by the [place] FDA and has been authorized for detection and/or diagnosis of SARS-CoV-2 by FDA under an Emergency Use Authorization (EUA).  This EUA will remain in effect (meaning this test can be used) for the duration of the COVID-19 declaration under Section 564(b)(1) of the Act, 21 U.S.C. section 360bbb-3(b)(1), unless the authorization is terminated or revoked sooner.  Performed at [facility], [address] [place], [place] [address]   MRSA PCR Screening     Status: Abnormal   Collection Time: 01/22/20  5:15 PM   Specimen: Nasopharyngeal  Result Value Ref Range Status   MRSA by PCR POSITIVE (A) NEGATIVE Final    Comment:        The GeneXpert MRSA Assay (FDA approved for NASAL specimens only), is one component of a comprehensive MRSA colonization surveillance program. It is not intended to diagnose MRSA infection nor to guide or monitor treatment for MRSA infections. RESULT CALLED TO, READ BACK BY AND VERIFIED WITH: BEBRA EMBDEN 01/22/20 AT 2211 BY ACR Performed at [facility], [address] [place], [place] [address]   CULTURE, BLOOD (ROUTINE X 2) w Reflex to ID Panel     Status: None (  Preliminary result)   Collection Time: 01/26/20  9:17 AM   Specimen: BLOOD  Result Value Ref Range Status   Specimen Description BLOOD LEFT ANTECUBITAL  Final   Special Requests   Final    BOTTLES DRAWN AEROBIC AND ANAEROBIC Blood Culture adequate volume   Culture   Final    NO GROWTH 1 DAY Performed at [facility], [address] [place], [place] [address]    Report Status PENDING  Incomplete  CULTURE, BLOOD (ROUTINE X 2) w Reflex to ID Panel     Status: None (Preliminary result)   Collection Time: 01/26/20  9:22 AM   Specimen: BLOOD  Result Value Ref Range Status   Specimen Description BLOOD RIGHT ANTECUBITAL  Final   Special Requests   Final    BOTTLES DRAWN AEROBIC AND ANAEROBIC Blood Culture adequate volume   Culture   Final     NO GROWTH 1 DAY Performed at [facility], [address] [place], [place] [address]    Report Status PENDING  Incomplete          IMAGING    DG Chest 2 View  Result Date: 01/27/2020 CLINICAL DATA:  Shortness of breath. EXAM: CHEST - 2 VIEW COMPARISON:  January 26, 2020. FINDINGS: Stable cardiomegaly. Stable old bilateral rib fractures. Severe emphysematous disease is noted in both lungs, right greater than left. No definite pneumothorax is noted. No definite pleural effusion is noted. Stable bibasilar opacities are noted which may represent pneumonia or possibly edema. Small bilateral pleural effusions may be present. IMPRESSION: Stable bibasilar opacities are noted which may represent pneumonia or possibly edema. Stable severe emphysematous disease is noted in both lungs, right greater than left. Small bilateral pleural effusions may be present. Aortic Atherosclerosis (ICD10-I70.0) and Emphysema (ICD10-J43.9). Electronically Signed   By: Lupita RaiderJames  Green Jr M.D.   On: 01/27/2020 13:55     Nutrition Status:        ASSESSMENT AND PLAN SYNOPSIS   Severe ACUTE Hypoxic and Hypercapnic Respiratory Failure due to acute cardiac failure from NSTEMI with acute PULM edema and resp failure and renal failure  Patient at high risk for intubation and cardiac arrest Patient with multiorgan failure  [facility]-will plan for DNR/DNI status and awaiting for final admin approval from state   -continue high flow [place] Do not use biPAP morphine as needed -continue Bronchodilator Therapy -Wean Fio2    ACUTE SYSTOLIC CARDIAC FAILURE- EF -oxygen as needed -Lasix as tolerated -follow up cardiac enzymes as indicated -follow up cardiology recs   ACUTE KIDNEY INJURY/Renal Failure -continue Foley Catheter-assess need -Avoid nephrotoxic agents -Follow urine output, BMP -Ensure adequate renal perfusion, optimize oxygenation -Renal dose  medications   SHOCK-CARDIOGENIC May use pressors at this time but doubt that this will resolve her progressive multiorgan failure Recommend CMO if does NOT respond to IVF's  CARDIAC ICU monitoring  ID -continue IV abx as prescibed -follow up cultures  GI GI PROPHYLAXIS as indicated  NUTRITIONAL STATUS Nutrition Status:    DIET-->NPO Constipation protocol as indicated  ENDO - will use ICU hypoglycemic\Hyperglycemia protocol if indicated     ELECTROLYTES -follow labs as needed -replace as needed -pharmacy consultation and following    CASE DISCUSSED IN MULTIDISCIPLINARY ROUNDS WITH ICU TEAM  Critical Care Time devoted to patient care services described in this note is 78 minutes.   Overall, patient is critically ill, prognosis is guarded.  Patient with Multiorgan failure and at high risk for  cardiac arrest and death.   Recommend DNR/DNI awaiting for final admin approval from [facility], may consider low dose pressors however this will likely not improve her medical condition  recommend COMFORT CARE if patient does not improve in 24-48 hrs.,      Lucie Leather, M.D.  [facility]  Medical Director [facility] Medical Director [facility] Cardio-Pulmonary Department

## 2020-01-27 NOTE — Progress Notes (Signed)
Subjective:  Patient reports pain as mild.  Short of breath.  Objective:   VITALS:   Vitals:   01/27/20 1630 01/27/20 1717 01/27/20 1834 01/27/20 1900  BP:  (!) 96/51 (!) 102/49 (!) 90/53  Pulse:  60  87  Resp:      Temp:      TempSrc:      SpO2: 93%  99%   Weight:      Height:        PHYSICAL EXAM:  Neurovascular intact Dorsiflexion/Plantar flexion intact Incision: dressing C/D/I No cellulitis present Compartment soft  LABS  Results for orders placed or performed during the hospital encounter of 01/21/20 (from the past 24 hour(s))  Blood gas, arterial     Status: Abnormal   Collection Time: 01/26/20 11:04 PM  Result Value Ref Range   FIO2 100.00    Delivery systems NON-REBREATHER OXYGEN MASK    pH, Arterial 7.48 (H) 7.35 - 7.45   pCO2 arterial 42 32 - 48 mmHg   pO2, Arterial 90 83 - 108 mmHg   Bicarbonate 31.3 (H) 20.0 - 28.0 mmol/L   Acid-Base Excess 7.1 (H) 0.0 - 2.0 mmol/L   O2 Saturation 97.5 %   Patient temperature 37.0    Collection site RIGHT RADIAL    Sample type ARTERIAL DRAW    Allens test (pass/fail) PASS PASS  CBC     Status: Abnormal   Collection Time: 01/27/20  5:03 AM  Result Value Ref Range   WBC 32.3 (H) 4.0 - 10.5 K/uL   RBC 3.11 (L) 3.87 - 5.11 MIL/uL   Hemoglobin 9.7 (L) 12.0 - 15.0 g/dL   HCT 77.4 (L) 36 - 46 %   MCV 91.0 80.0 - 100.0 fL   MCH 31.2 26.0 - 34.0 pg   MCHC 34.3 30.0 - 36.0 g/dL   RDW 12.8 78.6 - 76.7 %   Platelets 361 150 - 400 K/uL   nRBC 0.1 0.0 - 0.2 %  Basic metabolic panel     Status: Abnormal   Collection Time: 01/27/20  5:03 AM  Result Value Ref Range   Sodium 133 (L) 135 - 145 mmol/L   Potassium 4.1 3.5 - 5.1 mmol/L   Chloride 93 (L) 98 - 111 mmol/L   CO2 30 22 - 32 mmol/L   Glucose, Bld 146 (H) 70 - 99 mg/dL   BUN 43 (H) 8 - 23 mg/dL   Creatinine, Ser 2.09 (H) 0.44 - 1.00 mg/dL   Calcium 8.1 (L) 8.9 - 10.3 mg/dL   GFR calc non Af Amer 46 (L) >60 mL/min   GFR calc Af Amer 53 (L) >60 mL/min   Anion gap  10 5 - 15  Lactic acid, plasma     Status: Abnormal   Collection Time: 01/27/20  9:57 AM  Result Value Ref Range   Lactic Acid, Venous 2.3 (HH) 0.5 - 1.9 mmol/L  Procalcitonin - Baseline     Status: None   Collection Time: 01/27/20  9:57 AM  Result Value Ref Range   Procalcitonin 0.29 ng/mL  Lactic acid, plasma     Status: Abnormal   Collection Time: 01/27/20 12:47 PM  Result Value Ref Range   Lactic Acid, Venous 2.0 (HH) 0.5 - 1.9 mmol/L  Urinalysis, Routine w reflex microscopic     Status: Abnormal   Collection Time: 01/27/20  1:14 PM  Result Value Ref Range   Color, Urine YELLOW (A) YELLOW   APPearance HAZY (A) CLEAR   Specific Gravity,  Urine 1.017 1.005 - 1.030   pH 5.0 5.0 - 8.0   Glucose, UA NEGATIVE NEGATIVE mg/dL   Hgb urine dipstick LARGE (A) NEGATIVE   Bilirubin Urine NEGATIVE NEGATIVE   Ketones, ur NEGATIVE NEGATIVE mg/dL   Protein, ur 30 (A) NEGATIVE mg/dL   Nitrite NEGATIVE NEGATIVE   Leukocytes,Ua TRACE (A) NEGATIVE   RBC / HPF 0-5 0 - 5 RBC/hpf   WBC, UA 0-5 0 - 5 WBC/hpf   Bacteria, UA RARE (A) NONE SEEN   Squamous Epithelial / LPF 0-5 0 - 5   Hyaline Casts, UA PRESENT     DG Chest 2 View  Result Date: 01/27/2020 CLINICAL DATA:  Shortness of breath. EXAM: CHEST - 2 VIEW COMPARISON:  January 26, 2020. FINDINGS: Stable cardiomegaly. Stable old bilateral rib fractures. Severe emphysematous disease is noted in both lungs, right greater than left. No definite pneumothorax is noted. No definite pleural effusion is noted. Stable bibasilar opacities are noted which may represent pneumonia or possibly edema. Small bilateral pleural effusions may be present. IMPRESSION: Stable bibasilar opacities are noted which may represent pneumonia or possibly edema. Stable severe emphysematous disease is noted in both lungs, right greater than left. Small bilateral pleural effusions may be present. Aortic Atherosclerosis (ICD10-I70.0) and Emphysema (ICD10-J43.9). Electronically Signed    By: Lupita Raider M.D.   On: 01/27/2020 13:55   DG Chest Port 1 View  Result Date: 01/26/2020 CLINICAL DATA:  Shortness of breath. EXAM: PORTABLE CHEST 1 VIEW COMPARISON:  January 24, 2020 FINDINGS: Stably enlarged cardiac silhouette. Calcific atherosclerotic disease of the aorta. Upper lobe predominant moderate to severe emphysematous changes. Persistent patchy interstitial and alveolar opacities in bilateral lower lobes. No evidence of pneumothorax or pleural effusion. Stable chest wall deformity from prior trauma. IMPRESSION: 1. Persistent patchy interstitial and alveolar opacities in bilateral lower lobes may represent mixed pattern pulmonary edema or bronchopneumonia. 2. Moderate to severe emphysema. 3. Stably enlarged cardiac silhouette. Electronically Signed   By: Ted Mcalpine M.D.   On: 01/26/2020 09:15   DG HIP OPERATIVE UNILAT W OR W/O PELVIS RIGHT  Result Date: 01/26/2020 CLINICAL DATA:  Portable operative images for right proximal femur ORIF. EXAM: OPERATIVE RIGHT HIP (WITH PELVIS IF PERFORMED) 5 VIEWS TECHNIQUE: Fluoroscopic spot image(s) were submitted for interpretation post-operatively. COMPARISON:  01/25/2020 FINDINGS: Five spot fluoro graphic operative images show placement of a long medullary rod through the femur supporting a compression screw. The proximal femur fracture components have been reduced, with the distal fracture component minimally displaced medial compared to the proximal component by 6-7 mm. Orthopedic hardware appears well seated and aligned. IMPRESSION: 1. Significant reduction of the proximal right femur fracture following ORIF. Electronically Signed   By: Amie [place] M.D.   On: 01/26/2020 18:41    Assessment/Plan: 1 Day Post-Op   Principal Problem:   Hip fracture, unspecified laterality, closed, initial encounter (HCC) Active Problems:   Acute hypoxemic respiratory failure due to COVID-19 (HCC)   Hypokalemia   Acute respiratory failure with hypoxia  (HCC)   COPD exacerbation (HCC)   Non-STEMI (non-ST elevated myocardial infarction) (HCC)   Up with therapy, WBAT when medically stable OK to restart anticoagulation from ortho standpoint    Lyndle Herrlich , MD 01/27/2020, 8:07 PM

## 2020-01-27 NOTE — Progress Notes (Signed)
Physical Therapy Treatment Patient Details Name: Alice Wallace MRN: [id number] DOB: 07-24-1940 Today's Date: 01/27/2020    History of Present Illness Per MD notes: Pt is a 79 y.o. caucasian female with a known history of COPD, hypertension, deafness and muteness, presented to the emergency room with acute onset of fall at her assisted living facility with subsequent right hip pain and significant respiratory distress.  She was thought to be in COPD exacerbation was given IV Solu-Medrol and nebulized albuterol as well as duo nebs by EMS on route to the hospital.  Pt found to have a proximal right femoral diaphysis fracture and is now s/p R hip IM nail.  MD assessment also includes: Acute on chronic respiratory failure with COPD exacerbation /bronchitis, cannot rule out pneumonia, h/o CHF, suspected NSTEMI, HTN, hypokalemia, and depression.    PT Comments    Pt was pleasant and put forth good effort during the session but was limited by pain to the R hip with WB and was unable to come to standing position, nursing notified.  Pt's SpO2 on HHFNC was 90-92% with HR WNL.  Pt c/o minor dizziness in standing but did not limit participation during the session.  Pt will benefit from PT services in a SNF setting upon discharge to safely address deficits listed in patient problem list for decreased caregiver assistance and eventual return to PLOF.     Follow Up Recommendations  SNF     Equipment Recommendations  Other (comment) (TBD at next venue of care)    Recommendations for Other Services       Precautions / Restrictions Precautions Precautions: Fall Restrictions Weight Bearing Restrictions: Yes RLE Weight Bearing: Weight bearing as tolerated Other Position/Activity Restrictions: Per cardiology 01/27/20 OK for patient to participate with PT services without restrictions    Mobility  Bed Mobility Overal bed mobility: Needs Assistance Bed Mobility: Rolling;Supine to Sit;Sit to Supine Rolling:  Min assist   Supine to sit: Min assist Sit to supine: Min assist   General bed mobility comments: Min A for trunk and BLE control  Transfers Overall transfer level: Needs assistance Equipment used: Rolling walker (2 wheeled) Transfers: Sit to/from Stand Sit to Stand: Min assist;From elevated surface         General transfer comment: Pt attempted to stand this session but was unable secondary to R hip pain, nursing notified  Ambulation/Gait             General Gait Details: Unable   Stairs             Wheelchair Mobility    Modified Rankin (Stroke Patients Only)       Balance Overall balance assessment: Needs assistance Sitting-balance support: Single extremity supported;Feet unsupported Sitting balance-Leahy Scale: Fair     Standing balance support: Bilateral upper extremity supported Standing balance-Leahy Scale: Fair Standing balance comment: Mod lean on the RW for support                            Cognition Arousal/Alertness: Awake/alert Behavior During Therapy: WFL for tasks assessed/performed Overall Cognitive Status: Within Functional Limits for tasks assessed                                 General Comments: Pt is able to respond appropriately with thumbs up/down and head nodding. Used white board for more complexed questioning. pt is unable to write but  seems alert and oriented.      Exercises Other Exercises Other Exercises: Bed mobility and static sitting at EOB for improved activity tolerance Other Exercises: Static sitting and standing for increased activity tolerance    General Comments        Pertinent Vitals/Pain Pain Assessment: 0-10 Pain Score: 1  Pain Location: R hip Pain Intervention(s): Premedicated before session;Monitored during session    Home Living Family/patient expects to be discharged to:: Assisted living             Home Equipment: Walker - 4 wheels;Bedside commode       Prior Function Level of Independence: Needs assistance  Gait / Transfers Assistance Needed: Mod Ind amb in ALF with a rollator, 2 falls in the last 6-9 months ADL's / Homemaking Assistance Needed: Ind with dressing and assist from staff for bathing Comments: 2-3L home O2   PT Goals (current goals can now be found in the care plan section) Acute Rehab PT Goals Patient Stated Goal: non verbal PT Goal Formulation: Patient unable to participate in goal setting Time For Goal Achievement: 02/09/20 Potential to Achieve Goals: Good Progress towards PT goals: Not progressing toward goals - comment (limited by pain)    Frequency    BID      PT Plan Current plan remains appropriate    Co-evaluation              AM-PAC PT "6 Clicks" Mobility   Outcome Measure  Help needed turning from your back to your side while in a flat bed without using bedrails?: A Little Help needed moving from lying on your back to sitting on the side of a flat bed without using bedrails?: A Little Help needed moving to and from a bed to a chair (including a wheelchair)?: A Lot Help needed standing up from a chair using your arms (e.g., wheelchair or bedside chair)?: A Little Help needed to walk in hospital room?: Total Help needed climbing 3-5 steps with a railing? : Total 6 Click Score: 13    End of Session Equipment Utilized During Treatment: Gait belt;Oxygen Activity Tolerance: Patient limited by pain Patient left: in bed;with call bell/phone within reach;with bed alarm set;with nursing/sitter in room Nurse Communication: Mobility status;Other (comment) (Pt limited by pain) PT Visit Diagnosis: Unsteadiness on feet (R26.81);History of falling (Z91.81);Difficulty in walking, not elsewhere classified (R26.2);Muscle weakness (generalized) (M62.81);Pain Pain - Right/Left: Right Pain - part of body: Hip     Time: 6503-5465 PT Time Calculation (min) (ACUTE ONLY): 26 min  Charges:  $Therapeutic  Activity: 23-37 mins                     D. Scott Lovena Kluck PT, DPT 01/27/20, 3:18 PM

## 2020-01-27 NOTE — TOC Initial Note (Signed)
Transition of Care [facility]) - Initial/Assessment Note    Patient Details  Name: Alice Wallace MRN: [id number] Date of Birth: Jul 25, 1940  [facility]) CM/SW Contact:    Maree Krabbe, LCSW Phone Number: 01/27/2020, 2:24 PM  Clinical Narrative:    CSW received a call back from pt's legal guardian Jewel. Jewel states that she is agreeable for pt to d/c to SNF when medically stable. Jewel would like for CSW to try [facility] first and then just try other facilities in [place]. CSW will provide b/o once available.               Expected Discharge Plan: Skilled Nursing Facility Barriers to Discharge: Continued Medical Work up   Patient Goals and CMS Choice Patient states their goals for this hospitalization and ongoing recovery are:: for pt to get rehab   Choice offered to / list presented to : [facility] POA / Guardian  Expected Discharge Plan and Services Expected Discharge Plan: Skilled Nursing Facility   Discharge Planning Services: CM Consult Post Acute Care Choice: Skilled Nursing Facility Living arrangements for the past 2 months: Assisted Living Facility                                      Prior Living Arrangements/Services Living arrangements for the past 2 months: Assisted Living Facility Lives with:: Self Patient language and need for interpreter reviewed:: Yes Do you feel safe going back to the place where you live?: Yes      Need for Family Participation in Patient Care: Yes (Comment) Care giver support system in place?: Yes (comment)   Criminal Activity/Legal Involvement Pertinent to Current Situation/Hospitalization: No - Comment as needed  Activities of Daily Living      Permission Sought/Granted Permission sought to share information with : Guardian    Share Information with NAME: Gerlene Fee     Permission granted to share info w Relationship: Legal Guardian     Emotional Assessment Appearance:: Appears stated age Attitude/Demeanor/Rapport:  Unable to Assess Affect (typically observed): Unable to Assess Orientation: : Oriented to Self Alcohol / Substance Use: Not Applicable Psych Involvement: No (comment)  Admission diagnosis:  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] Elevated troponin level [R77.8] Demand ischemia (HCC) [I24.8] COPD exacerbation (HCC) [J44.1] Chronic obstructive pulmonary disease with acute exacerbation (HCC) [J44.1] Fall, initial encounter [W19.XXXA] Acute on chronic respiratory failure with hypoxia (HCC) [J96.21] Nondisplaced fracture of base of neck of right femur, initial encounter for closed fracture [facility]) [S72.044A] Patient Active Problem List   Diagnosis Date Noted   Hip fracture, unspecified laterality, closed, initial encounter (HCC) 01/26/2020   COPD exacerbation (HCC) 01/21/2020   Non-STEMI (non-ST elevated myocardial infarction) (HCC) 01/21/2020   Acute respiratory failure with hypoxia (HCC)    Pneumonia due to COVID-19 virus 06/07/2019   Hypokalemia 06/07/2019   AKI (acute kidney injury) (HCC) 06/07/2019   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

## 2020-01-27 NOTE — Progress Notes (Signed)
OT Cancellation Note  Patient Details Name: Alice Wallace MRN: [id number] DOB: [birth year]   Cancelled Treatment:    Reason Eval/Treat Not Completed: Patient at procedure or test/ unavailable  RN present in room giving medication and attending to patient care at this time.  It's noted that pt on 70% heated high flow with audible wheezing at this time. Will f/u at later date/time as pt available. Thank you.  Rejeana Brock, MS, OTR/L ascom [phone] 01/27/20, 9:04 AM

## 2020-01-27 NOTE — Progress Notes (Signed)
Patient to go to x-ray after neb. Prior to neb, an attempt to wean patient down to Regular HFNC at 15L but patients saturations were only 87-89%. Patient was placed on NRB and sats rose to 95%. Will resume HHFNC at previous settings once patient returns from x-ray.

## 2020-01-27 NOTE — Progress Notes (Signed)
Patient nauseated at this time. Placed bipap on standby. Patient changed over to 100% NRB until nausea and vomiting is resolved. Provider aware.

## 2020-01-27 NOTE — Progress Notes (Signed)
   01/27/20 0040  Vitals  Temp (!) 96.4 F (35.8 C)  Temp Source Axillary  BP (!) 90/54  MAP (mmHg) 65  BP Location Right Arm  BP Method Automatic  Patient Position (if appropriate) Lying  Pulse Rate 60  Pulse Rate Source Monitor  Resp 19  Level of Consciousness  Level of Consciousness Alert  MEWS COLOR  MEWS Score Color Yellow  Oxygen Therapy  SpO2 100 %  O2 Device Non-rebreather Mask  Pain Assessment  Pain Scale 0-10  Pain Score 0  MEWS Score  MEWS Temp 1  MEWS Systolic 1  MEWS Pulse 0  MEWS RR 0  MEWS LOC 0  MEWS Score 2  Provider Notification  Provider Name/Title Webb Silversmith NP  Date Provider Notified 01/27/20  Time Provider Notified [phone]  Notification Type Page  Notification Reason Change in status

## 2020-01-27 NOTE — Progress Notes (Signed)
Patient started on LR at 79ml/ hr. ABGs done per Webb Silversmith NP. Patient now wearing high flow 45L 70%. Her only complaint at this time is that she is hot. Tempeture in room adjusted, waiting on personal fan to arrive to put in patients room.

## 2020-01-27 NOTE — Progress Notes (Signed)
Vitals with BMI 01/27/2020 01/27/2020 01/27/2020  Height - - -  Weight - - -  BMI - - -  Systolic 90 104 102  Diastolic 49 61 56  Pulse 84 79 87     The patient was seen and examined, mildly lethargic, O2 demand has increased on high flow oxygen. Patient O2 sats mid-low 80s on heated HFNC 40L, 55%, RT increased to 40L, 70%.   IV fluid was DC'd Considering IV Lasix Rechecking vitals  Lactic acid is improved from 2.3- >>>2.0   Reached out to legal guardian Ms. Theodosia Blender at [phone] The above findings were discussed with her in detail. She confirmed that patient will remain full CODE STATUS.  We will considering moving her to ICU considering moving her to her ICU initiation of pressors.

## 2020-01-27 NOTE — Progress Notes (Signed)
Patient O2 sats mid-low 80s on heated HFNC 40L, 55%, RT increased to 40L, 70%. Dr. Flossie Dibble notified, holding IVF for now.

## 2020-01-27 NOTE — Progress Notes (Signed)
[facility] Cardiology    SUBJECTIVE: The patient reports 1/10 chest pain this morning, which is her baseline, denies worsening chest pain.    Vitals:   01/27/20 0045 01/27/20 0315 01/27/20 0727 01/27/20 0748  BP:  (!) 93/58 (!) 102/56   Pulse:  96 87   Resp:  18 18   Temp:  97.6 F (36.4 C) 97.7 F (36.5 C)   TempSrc:   Oral   SpO2: 98% 98% 97% 93%  Weight:      Height:         Intake/Output Summary (Last 24 hours) at 01/27/2020 0830 Last data filed at 01/27/2020 0400 Gross per 24 hour  Intake 1300.7 ml  Output 150 ml  Net 1150.7 ml      PHYSICAL EXAM  General: Frail elderly female lying in bed in no acute distress HEENT:  Normocephalic and atramatic. deaf Neck:  No JVD.  Lungs: expiratory wheezing, no distress. Heart: HRRR . Normal S1 and S2 without gallops or murmurs.  Abdomen: nondistended Extremities: No clubbing, cyanosis or edema.   Neuro: Alert and oriented X 3   LABS: Basic Metabolic Panel: Recent Labs    01/26/20 0425 01/27/20 0503  NA 136 133*  K 4.0 4.1  CL 91* 93*  CO2 31 30  GLUCOSE 137* 146*  BUN 26* 43*  CREATININE 1.31* 1.14*  CALCIUM 9.0 8.1*   Liver Function Tests: No results for input(s): AST, ALT, ALKPHOS, BILITOT, PROT, ALBUMIN in the last 72 hours. No results for input(s): LIPASE, AMYLASE in the last 72 hours. CBC: Recent Labs    01/26/20 0425 01/27/20 0503  WBC 20.9* 32.3*  HGB 11.0* 9.7*  HCT 33.9* 28.3*  MCV 91.6 91.0  PLT 465* 361   Cardiac Enzymes: No results for input(s): CKTOTAL, CKMB, CKMBINDEX, TROPONINI in the last 72 hours. BNP: Invalid input(s): POCBNP D-Dimer: No results for input(s): DDIMER in the last 72 hours. Hemoglobin A1C: No results for input(s): HGBA1C in the last 72 hours. Fasting Lipid Panel: No results for input(s): CHOL, HDL, LDLCALC, TRIG, CHOLHDL, LDLDIRECT in the last 72 hours. Thyroid Function Tests: No results for input(s): TSH, T4TOTAL, T3FREE, THYROIDAB in the last 72 hours.  Invalid  input(s): FREET3 Anemia Panel: No results for input(s): VITAMINB12, FOLATE, FERRITIN, TIBC, IRON, RETICCTPCT in the last 72 hours.  DG Chest Port 1 View  Result Date: 01/26/2020 CLINICAL DATA:  Shortness of breath. EXAM: PORTABLE CHEST 1 VIEW COMPARISON:  January 24, 2020 FINDINGS: Stably enlarged cardiac silhouette. Calcific atherosclerotic disease of the aorta. Upper lobe predominant moderate to severe emphysematous changes. Persistent patchy interstitial and alveolar opacities in bilateral lower lobes. No evidence of pneumothorax or pleural effusion. Stable chest wall deformity from prior trauma. IMPRESSION: 1. Persistent patchy interstitial and alveolar opacities in bilateral lower lobes may represent mixed pattern pulmonary edema or bronchopneumonia. 2. Moderate to severe emphysema. 3. Stably enlarged cardiac silhouette. Electronically Signed   By: Ted Mcalpine M.D.   On: 01/26/2020 09:15   DG HIP OPERATIVE UNILAT W OR W/O PELVIS RIGHT  Result Date: 01/26/2020 CLINICAL DATA:  Portable operative images for right proximal femur ORIF. EXAM: OPERATIVE RIGHT HIP (WITH PELVIS IF PERFORMED) 5 VIEWS TECHNIQUE: Fluoroscopic spot image(s) were submitted for interpretation post-operatively. COMPARISON:  01/25/2020 FINDINGS: Five spot fluoro graphic operative images show placement of a long medullary rod through the femur supporting a compression screw. The proximal femur fracture components have been reduced, with the distal fracture component minimally displaced medial compared to the proximal component  by 6-7 mm. Orthopedic hardware appears well seated and aligned. IMPRESSION: 1. Significant reduction of the proximal right femur fracture following ORIF. Electronically Signed   By: Amie Portland M.D.   On: 01/26/2020 18:41   DG HIP UNILAT WITH PELVIS 2-3 VIEWS RIGHT  Result Date: 01/25/2020 CLINICAL DATA:  Dislocated hip, right, initial encounter. Worsening right hip pain. EXAM: DG HIP (WITH OR WITHOUT  PELVIS) 2-3V RIGHT COMPARISON:  Radiograph and CT 01/21/2020, radiograph 07/04/2019 FINDINGS: The previous incomplete subtrochanteric fracture of the right hip is now a complete fracture, displaced and significantly angulated with apex anterior angulation of 79 degrees. Fracture may be bisphosphonate related with cortical thickening noted on February 2021 exam. The femoral head remains seated in the acetabulum without dislocation. Moderate right hip osteoarthritis. Pubic rami are intact. IMPRESSION: 1. The previous incomplete subtrochanteric fracture is now a complete fracture, displaced with significant angulation. 2. Femoral head is well seated in the acetabulum without dislocation. Electronically Signed   By: Narda Rutherford M.D.   On: 01/25/2020 17:15     Echo LVEF 60% with no wall motion abnormalities  TELEMETRY: sinus rhythm, 80s  ASSESSMENT AND PLAN:  Principal Problem:   Hip fracture, unspecified laterality, closed, initial encounter (HCC) Active Problems:   Acute hypoxemic respiratory failure due to COVID-19 (HCC)   Hypokalemia   Acute respiratory failure with hypoxia (HCC)   COPD exacerbation (HCC)   Non-STEMI (non-ST elevated myocardial infarction) (HCC)   1. NSTEMI, treated medically, heparin discontinued9/3/2021with recurrent chest pain. Repeat ECGshows ST elevation in lead 1 without meeting STEMI criteria,and troponintrending down, then back upwithout recurrent chest pain([phone],2270,1360, 4566, 4137, 3828). Patient reports feeling better with nitroglycerin.Isosorbide mononitrate added. 2D echocardiogram revealed normal left ventricular function without wall motion abnormalities. 2.Chronic diastolic CHF, BNP 797, chest xray negative for acute abnormality. Denies shortness of breath at rest, no lower extremity edema or JVD. On oral Lasix, switched to IV Lasix 20 mg today. 3. Acute on chronic respiratory failure with COPD exacerbationand possible pneumonia, on  supplemental O2 which has been weaned to 3L, IV steroids, and antibiotics. Wheezing this morning. 4.  Essential hypertension, blood pressure well controlled on current BP medications 5.  Right femur fracture, status post trochanteric femoral nailing 01/26/2020  Recommendations: 1. Continue aspirin 81 mg daily, add Plavix 75 mg daily for NSTEMI 2. Continue high intensity statin 3. Reduce Cardizem CD to 180 mg and add metoprolol succinate 25 mg daily  4. Consider adding ACEi at discharge 5. No further cardiac diagnostics recommended at this time.  6. Follow-up with Dr. [name] in 1 week from discharge  Leanora Ivanoff, PA-C 01/27/2020 8:30 AM   Sign off for now; please call with questions.

## 2020-01-27 NOTE — Progress Notes (Signed)
PROGRESS NOTE    Patient: Alice Wallace                            PCP: Devoria Glassing, NP                    DOB: 1940/12/24            DOA: 01/21/2020 ZOX:[id number]             DOS: 01/27/2020, 11:42 AM   LOS: 6 days   Date of Service: The patient was seen and examined on 01/27/2020  Subjective:   Postop day #1 Status post right hip open reduction internal fixation.  Tolerated procedure well, postprocedure patient remained in respiratory distress, could not tolerate BiPAP overnight, has been placed on high flow oxygen.. Satting 92% FiO2 70%, flow rate 45 ABG 7.4 8/42/90/31 0.3    Brief Narrative:   Alice Wallace  is a 79 y.o. Caucasian female with a known history of COPD, hypertension, deafness and muteness, presented to the emergency room with acute onset of fall at her assisted living facility with subsequent right hip pain and significant respiratory distress.  Her pulse currently was down to the 70s on her 2 L O2 by nasal cannula with associated coarse breath sounds and wheezing.  She was subsequently increased to 6 L, then 15 L via nonrebreather mask, to maintain O2 sat greater 92%.   She was thought to be in COPD exacerbation was given IV Solu-Medrol and nebulized albuterol as well as duo nebs by EMS on route to the hospital. She can read lips and written communications.  ED  RR 23, HR 106, 93-96% on 15 L of O2 via NRM   BNP of 191.6,  troponin I of 245 mL 1281 and CBC showed leukocytosis of 10.8 as well as anemia.  COVID-19 PCR came back negative.  EKG showed mild ectopic atrial tachycardia with  incomplete RBB, LVH with T wave inversion laterally.  Treated with aspirin, IV heparin, nebs, IV Versed, morphine Chest CTA that ruled out PE and showed right lower lobe atelectasis CT scan since suspicious for nondisplaced stress fracture of the proximal right femoral diaphysis with consideration for MRI.  [name] was notified about the patient.     Assessment & Plan:    Principal Problem:   Hip fracture, unspecified laterality, closed, initial encounter (HCC) Active Problems:   Non-STEMI (non-ST elevated myocardial infarction) (HCC)   Acute hypoxemic respiratory failure due to COVID-19 [facility])   Hypokalemia   Acute respiratory failure with hypoxia (HCC)   COPD exacerbation (HCC)   Proximal right femoral diaphysis fracture Postop day #1 Status post open reduction fixation 01/26/2020  -X-ray of the hip: The previous incomplete subtrochanteric fracture is now a complete fracture, displaced with significant angulation -Yesterday 01/25/2020 right hip fracture progress to dislocate, with prominent angulation now -Orthopedic team following closely -Ortho consulted -stating that fracture is suspicious of bisphosphonate related side effect recommending discontinuing alendronate   Sepsis -severe sepsis Meeting sepsis/severe sepsis criteria, marked leukocytosis, acute on chronic respiratory failure Borderline hypotensive, hypoxia.. Tachypnea overnight respiratory rate as high as 30 Could not tolerate BiPAP, on high flow oxygen  -WBC 17.4>> 20.9 >> 32.2  -Llactic acid 1.0 >> 2.3 today  - procalcitonin <0.10,  << 0.29 -Initiating IV fluids, due to her age and: Recent heart failure 192 aggressively resuscitated with LR, IV fluid Will initiate with lactated Ringer 1 L did not  follow-up with second liter if patient remained stable  -Likely reactive due to progression of proximal right hip fracture -Afebrile normotensive -Blood cultures>> negative to date -Pending urinalysis and culture -We will initiate broad-spectrum antibiotics of Ancef  >>> 01/27/2020 changing antibiotics to vancomycin and Zosyn pharmacy consulted for dosing    Acute on chronic respiratory failure with COPD exacerbation /bronchitis, cannot rule out pneumonia Prior to surgery she was on 3 L of oxygen, satting greater 92% Overnight required BiPAP could not tolerated-patient is on high flow  oxygen by nasal cannula and -FiO2 70%, flow rate 45 ABG 7.4 8/42/90/31 0.3 -Patient was on IV Solu-Medrol taper dose, will agree benefit of respiratory distress worsening  -Broadening antibiotics to vancomycin and Zosyn today 01/26/78 -We will hold off Advair Diskus.  Obtaining two-view chest x-ray  -Status post CT angiogram chest -negative for pulmonary embolism CT angiogram IMPRESSION: 1. Right lower lobe atelectasis versus pneumonia. 2. Moderately motion degraded chest CTA. No evidence of pulmonary embolism. 3. Advanced emphysema. Aortic Atherosclerosis   Chronic diastolic CHF. Echo reviewed no signs of overt diastolic failure, ejection fraction preserved 60-65% -Monitoring daily weight -Elevated BNP 797.4, no lower extremity edema noted congestion on chest x-ray -For no clear reason patient is on Zaroxolyn and Lasix at home will be resumed--- once patient is stable -Appreciate cardiology input -No signs of edema or fluid retention Patient was restarted on Lasix but had to be held, resuscitating with fluids as patient meets the sepsis criteria     Non-STEMI. -Was complaining of chest pain yesterday, improved denies any chest pain this morning Dr. [name] was following --cardiology Dr. Darrold Junker following this weekend, Recommending continue diuretics added isosorbide 30 mg daily Deferred any further intervention including cardiac cath at this time -Has cleared the patient for orthopedic intervention-surgery   -Continue medical management Patient has received aspirin, Plavix, statins, isosorbide    -Elevated troponin ... Troponin 2,276, this morning 1360 >>>  4566 >> 3828 --EKG reviewed: Normal sinus, ST abnormality noted on lead I but no other leads,  also to be reviewed by cardiology   -The patient will be continued on IV heparin.. Discontinued 9-79-21 -On aspirin, statins, as needed sublingual nitroglycerin and morphine -2D echo reviewed ejection fraction 60-65% negative  for any regional or global abnormality -Cardiology following closely ... Not recommending intervention at this time, medical management    Hypotensive-history of hypertension. -Patient blood pressure remained soft -Parameter has been placed on Cardizem CD and metoprolol  Depression -We will continue home medication of Lexapro, -No changes, stable  Vitamin D deficiency/osteoporosis Continue vitamin D supplements--will be resumed   GERD Continue Protonix -Stable  Hypokalemia Monitoring, repleting accordingly   Patient is deaf, and mute -Able to communicate by gestures adequately And writing   Nutritional status:          Consultants:  Cardiology/orthopedic   --------------------------------------------------------------------------------------------------------------------------------------------- DVT prophylaxis: Heparin subcu (Heparin Gtt discontinued) Code Status:   Code Status: Full Code Family Communication: No family member present at bedside- Discussed with patient, Discussed with care team, social worker, will reach out to guardian regarding CODE STATUS, goal of care -Consulted palliative  -Advance care planning has been discussed.   Admission status:    Status is: Inpatient  Remains inpatient appropriate because:Inpatient level of care appropriate due to severity of illness   Dispo: The patient is from:  assisted living              Anticipated d/c is to: SNF  Anticipated d/c date is:  SNF in 3-5 days               Patient currently is not medically stable to d/c.        Procedures:   No admission procedures for hospital encounter.   Antimicrobials:  Anti-infectives (From admission, onward)   Start     Dose/Rate Route Frequency Ordered Stop   01/27/20 2200  vancomycin (VANCOREADY) IVPB 750 mg/150 mL        750 mg 150 mL/hr over 60 Minutes Intravenous Every 12 hours 01/27/20 0853     01/27/20 1000  ceFEPIme (MAXIPIME) 2 g  in sodium chloride 0.9 % 100 mL IVPB        2 g 200 mL/hr over 30 Minutes Intravenous Every 12 hours 01/27/20 0853     01/27/20 1000  vancomycin (VANCOREADY) IVPB 1750 mg/350 mL        1,750 mg 175 mL/hr over 120 Minutes Intravenous  Once 01/27/20 0853     01/26/20 1715  50,000 units bacitracin in 0.9% normal saline 250 mL irrigation  Status:  Discontinued          As needed 01/26/20 1715 01/26/20 1814   01/26/20 1000  levofloxacin (LEVAQUIN) tablet 500 mg  Status:  Discontinued        500 mg Oral Every 24 hours 01/25/20 0902 01/27/20 0902   01/26/20 0900  ceFAZolin (ANCEF) IVPB 1 g/50 mL premix  Status:  Discontinued        1 g 100 mL/hr over 30 Minutes Intravenous Every 8 hours 01/26/20 0823 01/27/20 0831   01/25/20 1000  levofloxacin (LEVAQUIN) tablet 500 mg  Status:  Discontinued        500 mg Oral Daily 01/25/20 0838 01/25/20 0902   01/24/20 1000  levofloxacin (LEVAQUIN) tablet 500 mg        500 mg Oral Daily 01/23/20 1126 01/25/20 0836   01/21/20 1530  ceFAZolin (ANCEF) IVPB 2g/100 mL premix  Status:  Discontinued        2 g 200 mL/hr over 30 Minutes Intravenous  Once 01/21/20 0738 01/21/20 1009   01/21/20 0730  azithromycin (ZITHROMAX) 500 mg in sodium chloride 0.9 % 250 mL IVPB  Status:  Discontinued        500 mg 250 mL/hr over 60 Minutes Intravenous Every 24 hours 01/21/20 0615 01/23/20 1123   01/21/20 0700  cefTRIAXone (ROCEPHIN) 1 g in sodium chloride 0.9 % 100 mL IVPB  Status:  Discontinued        1 g 200 mL/hr over 30 Minutes Intravenous Every 24 hours 01/21/20 0615 01/23/20 1123       Medication:   acidophilus  1 capsule Oral TID   alum & mag hydroxide-simeth  30 mL Oral TID AC & HS   aspirin EC  81 mg Oral Daily   atorvastatin  80 mg Oral Daily   calcium-vitamin D  1 tablet Oral BID   Chlorhexidine Gluconate Cloth  6 each Topical Q0600   cholecalciferol  5,000 Units Oral Daily   clopidogrel  75 mg Oral Daily   diltiazem  180 mg Oral Daily   docusate  sodium  100 mg Oral BID   escitalopram  10 mg Oral Daily   feeding supplement (ENSURE ENLIVE)  237 mL Oral TID BM   ferrous sulfate  325 mg Oral BID WC   guaiFENesin  600 mg Oral Daily   ipratropium-albuterol  3 mL Nebulization TID   isosorbide mononitrate  30 mg Oral Daily   loratadine  10 mg Oral Daily   metolazone  2.5 mg Oral BID   metoprolol succinate  25 mg Oral Daily   mupirocin ointment  1 application Nasal BID   pantoprazole  40 mg Oral Daily   potassium chloride SA  40 mEq Oral Daily    acetaminophen **OR** acetaminophen, albuterol, dextromethorphan, diphenhydrAMINE, HYDROmorphone (DILAUDID) injection, loperamide, magnesium hydroxide, metoCLOPramide **OR** metoCLOPramide (REGLAN) injection, morphine injection, nitroGLYCERIN, ondansetron **OR** ondansetron (ZOFRAN) IV, ondansetron **OR** ondansetron (ZOFRAN) IV, tiZANidine, traZODone   Objective:   Vitals:   01/27/20 0045 01/27/20 0315 01/27/20 0727 01/27/20 0748  BP:  (!) 93/58 (!) 102/56   Pulse:  96 87   Resp:  18 18   Temp:  97.6 F (36.4 C) 97.7 F (36.5 C)   TempSrc:   Oral   SpO2: 98% 98% 97% 93%  Weight:      Height:        Intake/Output Summary (Last 24 hours) at 01/27/2020 1142 Last data filed at 01/27/2020 0400 Gross per 24 hour  Intake 1300.7 ml  Output 150 ml  Net 1150.7 ml   Filed Weights   01/22/20 0547 01/24/20 0538 01/25/20 0500  Weight: 56.2 kg 68.5 kg 73.9 kg     Examination:      Physical Exam:   General:  Alert, oriented, cooperative, no distress; (deaf mute at baseline  HEENT:  Normocephalic, PERRL, otherwise with in Normal limits   Neuro:  CNII-XII intact. , normal motor and sensation, reflexes intact   Lungs:   Clear to auscultation BL, Respirations unlabored, no wheezes / crackles  Cardio:    S1/S2, RRR, No murmure, No Rubs or Gallops   Abdomen:   Soft, non-tender, bowel sounds active all four quadrants,  no guarding or peritoneal signs.  Muscular skeletal:   Right  hip postsurgical dressing in place Limited range of motion due to pain Limited exam - in bed, able to move all 4 extremities, Normal strength,  2+ pulses,  symmetric, No pitting edema  Skin:  Dry, warm to touch, negative for any Rashes, right hip surgical wound with dressing  Wounds: Please see nursing documentation Pressure Injury 05/27/19 Ischial tuberosity Left Unstageable - Full thickness tissue loss in which the base of the injury is covered by slough (yellow, tan, gray, green or brown) and/or eschar (tan, brown or black) in the wound bed. (Active)  05/27/19 1630  Location: Ischial tuberosity  Location Orientation: Left  Staging: Unstageable - Full thickness tissue loss in which the base of the injury is covered by slough (yellow, tan, gray, green or brown) and/or eschar (tan, brown or black) in the wound bed.  Wound Description (Comments):   Present on Admission: Yes                ------------------------------------------------------------------------------------------------------------------------------------------    LABs:  CBC Latest Ref Rng & Units 01/27/2020 01/26/2020 01/25/2020  WBC 4.0 - 10.5 K/uL 32.3(H) 20.9(H) 17.4(H)  Hemoglobin 12.0 - 15.0 g/dL 1.6(W) 11.0(L) 10.0(L)  Hematocrit 36 - 46 % 28.3(L) 33.9(L) 29.2(L)  Platelets 150 - 400 K/uL 361 465(H) 407(H)   CMP Latest Ref Rng & Units 01/27/2020 01/26/2020 01/25/2020  Glucose 70 - 99 mg/dL 109(N) 235(T) 732(K)  BUN 8 - 23 mg/dL 02(R) 42(H) 15  Creatinine 0.44 - 1.00 mg/dL 0.62(B) 7.62(G) 3.15  Sodium 135 - 145 mmol/L 133(L) 136 134(L)  Potassium 3.5 - 5.1 mmol/L 4.1 4.0 3.6  Chloride 98 - 111 mmol/L 93(L) 91(L)  99  CO2 22 - 32 mmol/L 30 31 27   Calcium 8.9 - 10.3 mg/dL 8.1(L) 9.0 8.7(L)  Total Protein 6.5 - 8.1 g/dL - - -  Total Bilirubin 0.3 - 1.2 mg/dL - - -  Alkaline Phos 38 - 126 U/L - - -  AST 15 - 41 U/L - - -  ALT 0 - 44 U/L - - -       Micro Results Recent Results (from the past 240 hour(s))  SARS  Coronavirus 2 by RT PCR (hospital order, [facility]) Nasopharyngeal Nasopharyngeal Swab     Status: None   Collection Time: 01/21/20  2:53 AM   Specimen: Nasopharyngeal Swab  Result Value Ref Range Status   SARS Coronavirus 2 NEGATIVE NEGATIVE Final    Comment: (NOTE) SARS-CoV-2 target nucleic acids are NOT DETECTED.  The SARS-CoV-2 RNA is generally detectable in upper and lower respiratory specimens during the acute phase of infection. The lowest concentration of SARS-CoV-2 viral copies this assay can detect is 250 copies / mL. A negative result does not preclude SARS-CoV-2 infection and should not be used as the sole basis for treatment or other patient management decisions.  A negative result may occur with improper specimen collection / handling, submission of specimen other than nasopharyngeal swab, presence of viral mutation(s) within the areas targeted by this assay, and inadequate number of viral copies (<250 copies / mL). A negative result must be combined with clinical observations, patient history, and epidemiological information.  Fact Sheet for Patients:   BoilerBrush.com.cyhttps://www.fda.gov/media/136312/download  Fact Sheet for Healthcare Providers: https://pope.com/https://www.fda.gov/media/136313/download  This test is not yet approved or  cleared by the [place] FDA and has been authorized for detection and/or diagnosis of SARS-CoV-2 by FDA under an Emergency Use Authorization (EUA).  This EUA will remain in effect (meaning this test can be used) for the duration of the COVID-19 declaration under Section 564(b)(1) of the Act, 21 U.S.C. section 360bbb-3(b)(1), unless the authorization is terminated or revoked sooner.  Performed at [facility], [address] [place], [place] [address]   MRSA PCR Screening     Status: Abnormal   Collection Time: 01/22/20  5:15 PM   Specimen: Nasopharyngeal  Result Value Ref Range Status   MRSA by PCR POSITIVE (A)  NEGATIVE Final    Comment:        The GeneXpert MRSA Assay (FDA approved for NASAL specimens only), is one component of a comprehensive MRSA colonization surveillance program. It is not intended to diagnose MRSA infection nor to guide or monitor treatment for MRSA infections. RESULT CALLED TO, READ BACK BY AND VERIFIED WITH: BEBRA EMBDEN 01/22/20 AT 2211 BY ACR Performed at [facility], [address] [place], [place] [address]   CULTURE, BLOOD (ROUTINE X 2) w Reflex to ID Panel     Status: None (Preliminary result)   Collection Time: 01/26/20  9:17 AM   Specimen: BLOOD  Result Value Ref Range Status   Specimen Description BLOOD LEFT ANTECUBITAL  Final   Special Requests   Final    BOTTLES DRAWN AEROBIC AND ANAEROBIC Blood Culture adequate volume   Culture   Final    NO GROWTH 1 DAY Performed at [facility], [address] [place], [place] [address]    Report Status PENDING  Incomplete  CULTURE, BLOOD (ROUTINE X 2) w Reflex to ID Panel     Status: None (Preliminary result)   Collection Time: 01/26/20  9:22 AM  Specimen: BLOOD  Result Value Ref Range Status   Specimen Description BLOOD RIGHT ANTECUBITAL  Final   Special Requests   Final    BOTTLES DRAWN AEROBIC AND ANAEROBIC Blood Culture adequate volume   Culture   Final    NO GROWTH 1 DAY Performed at [facility], [address], [place], [place] [address]    Report Status PENDING  Incomplete    Radiology Reports CT Angio Chest PE W/Cm &/Or Wo Cm  Result Date: 01/21/2020 CLINICAL DATA:  Fall and shortness of breath. EXAM: CT ANGIOGRAPHY CHEST WITH CONTRAST TECHNIQUE: Multidetector CT imaging of the chest was performed using the standard protocol during bolus administration of intravenous contrast. Multiplanar CT image reconstructions and MIPs were obtained to evaluate the vascular anatomy. CONTRAST:  75mL OMNIPAQUE IOHEXOL 350 MG/ML SOLN COMPARISON:  06/09/2019 FINDINGS: Cardiovascular:  Prominent right ventricular size. No pericardial effusion. Aortic and great vessel atherosclerotic calcification. No pulmonary artery filling defect is seen. There is significant motion artifact at the lung bases. Mediastinum/Nodes: Small to moderate sliding hiatal hernia. No adenopathy. Lungs/Pleura: Opacity in the right lower lobe with volume loss. No edema, effusion, or pneumothorax. Panlobular emphysema. Dystrophic calcifications in the apical lungs. Bronchomalacia. Upper Abdomen: Atherosclerotic calcification. Musculoskeletal: Healing anterior and lateral left rib fractures. Review of the MIP images confirms the above findings. IMPRESSION: 1. Right lower lobe atelectasis versus pneumonia. 2. Moderately motion degraded chest CTA. No evidence of pulmonary embolism. 3. Advanced emphysema. Aortic Atherosclerosis (ICD10-I70.0) and Emphysema (ICD10-J43.9). Electronically Signed   By: Marnee Spring M.D.   On: 01/21/2020 05:55   CT Hip Right Wo Contrast  Result Date: 01/21/2020 CLINICAL DATA:  Hip trauma.  Fracture suspected. EXAM: CT OF THE RIGHT HIP WITHOUT CONTRAST TECHNIQUE: Multidetector CT imaging of the right hip was performed according to the standard protocol. Multiplanar CT image reconstructions were also generated. COMPARISON:  Hip x-rays from earlier same day. FINDINGS: Bones/Joint/Cartilage Linear lucency identified in the lateral cortex of the proximal right femoral diaphysis associated with focal cortical thickening. Imaging features compatible with nondisplaced stress fracture. The abnormality is included on the extreme inferior aspect of this study and is best visualized on sagittal reconstruction image 21 of series 5. Degenerative changes are noted in the right hip. Ligaments Suboptimally assessed by CT. Muscles and Tendons No substantial hemorrhage noted in the proximal thigh musculature. Soft tissues Distended urinary bladder is incompletely visualized. IMPRESSION: Linear lucency in the lateral  cortex of the proximal right femoral diaphysis associated with focal cortical thickening. Imaging features compatible with nondisplaced stress fracture. Consider MRI of the proximal right femur for definitive characterization. Electronically Signed   By: Kennith Center M.D.   On: 01/21/2020 06:02   DG Chest Port 1 View  Result Date: 01/26/2020 CLINICAL DATA:  Shortness of breath. EXAM: PORTABLE CHEST 1 VIEW COMPARISON:  January 24, 2020 FINDINGS: Stably enlarged cardiac silhouette. Calcific atherosclerotic disease of the aorta. Upper lobe predominant moderate to severe emphysematous changes. Persistent patchy interstitial and alveolar opacities in bilateral lower lobes. No evidence of pneumothorax or pleural effusion. Stable chest wall deformity from prior trauma. IMPRESSION: 1. Persistent patchy interstitial and alveolar opacities in bilateral lower lobes may represent mixed pattern pulmonary edema or bronchopneumonia. 2. Moderate to severe emphysema. 3. Stably enlarged cardiac silhouette. Electronically Signed   By: Ted Mcalpine M.D.   On: 01/26/2020 09:15   DG Chest Port 1 View  Result Date: 01/24/2020 CLINICAL DATA:  COPD exacerbation. Pt presented 01/21/2020 to the emergency room with  acute onset of fall at her assisted living facility with subsequent right hip pain and significant respiratory distress. Hx - COPD, HTN, non-smoker. EXAM: PORTABLE CHEST 1 VIEW COMPARISON:  01/21/2020 FINDINGS: Changes of emphysema skeletal lung scarring and interstitial thickening are stable. Opacity at the right lung base has mildly improved from the previous study. No new lung abnormalities. No convincing pleural effusion and no pneumothorax. Cardiac silhouette is normal in size. Multiple bilateral old rib fractures are stable. IMPRESSION: 1. Mild interval improvement. Decreased opacity at the right lung base consistent with improved pneumonia or atelectasis. 2. No other change. Stable chronic findings including  extensive emphysema. Electronically Signed   By: Amie Portland M.D.   On: 01/24/2020 10:43   DG Chest Portable 1 View  Result Date: 01/21/2020 CLINICAL DATA:  Larey Seat, short of breath, hypoxia EXAM: PORTABLE CHEST 1 VIEW COMPARISON:  06/07/2019 FINDINGS: Single frontal view of the chest demonstrates a stable cardiac silhouette. Extensive bullous emphysematous changes are seen, greatest at the right apex. Marked areas of scarring and fibrosis are seen throughout the lungs. Increased density at the right costophrenic angle may reflect consolidation and/or effusion. Chronic posttraumatic changes of the thoracic cage. No acute displaced fractures. IMPRESSION: 1. 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 2. Density at the right lateral lung base may reflect focal lung consolidation and/or small effusion. Electronically Signed   By: Sharlet Salina M.D.   On: 01/21/2020 03:48   ECHOCARDIOGRAM COMPLETE  Result Date: 01/21/2020    ECHOCARDIOGRAM REPORT   Patient Name:   [facility] [place] Date of Exam: 01/21/2020 Medical Rec #:  [id number]     Height:       66.0 in Accession #:    [id number]    Weight:       123.2 lb Date of Birth:  22-Jan-1941      BSA:          1.628 m Patient Age:    79 years      BP:           116/63 mmHg Patient Gender: F             HR:           74 bpm. Exam Location:  [facility] Procedure: 2D Echo, Cardiac Doppler and Color Doppler Indications:     NSTEMI 121.4  History:         Patient has no prior history of Echocardiogram examinations.                  COPD; Risk Factors:Hypertension.  Sonographer:     Cristela Blue RDCS (AE) Referring Phys:  [id number] Vernetta Honey MANSY Diagnosing Phys: Debbe Odea MD IMPRESSIONS  1. Left ventricular ejection fraction, by estimation, is 60 to 65%. The left ventricle has normal function. The left ventricle has no regional wall motion abnormalities. Left ventricular diastolic parameters are consistent with Grade I diastolic dysfunction (impaired  relaxation).  2. Right ventricular systolic function is normal. The right ventricular size is normal.  3. Left atrial size was moderately dilated.  4. The mitral valve is normal in structure. No evidence of mitral valve regurgitation. No evidence of mitral stenosis.  5. The aortic valve is normal in structure. Aortic valve regurgitation is not visualized. No aortic stenosis is present.  6. The inferior vena cava is normal in size with greater than 50% respiratory variability, suggesting right atrial pressure of 3 mmHg. FINDINGS  Left Ventricle: Left ventricular ejection fraction, by  estimation, is 60 to 65%. The left ventricle has normal function. The left ventricle has no regional wall motion abnormalities. The left ventricular internal cavity size was normal in size. There is  no left ventricular hypertrophy. Left ventricular diastolic parameters are consistent with Grade I diastolic dysfunction (impaired relaxation). Right Ventricle: The right ventricular size is normal. No increase in right ventricular wall thickness. Right ventricular systolic function is normal. Left Atrium: Left atrial size was moderately dilated. Right Atrium: Right atrial size was normal in size. Pericardium: There is no evidence of pericardial effusion. Mitral Valve: The mitral valve is normal in structure. Normal mobility of the mitral valve leaflets. No evidence of mitral valve regurgitation. No evidence of mitral valve stenosis. Tricuspid Valve: The tricuspid valve is normal in structure. Tricuspid valve regurgitation is not demonstrated. No evidence of tricuspid stenosis. Aortic Valve: The aortic valve is normal in structure. Aortic valve regurgitation is not visualized. No aortic stenosis is present. Aortic valve mean gradient measures 3.5 mmHg. Aortic valve peak gradient measures 6.5 mmHg. Aortic valve area, by VTI measures 2.46 cm. Pulmonic Valve: The pulmonic valve was not well visualized. Pulmonic valve regurgitation is not  visualized. No evidence of pulmonic stenosis. Aorta: The aortic root is normal in size and structure. Venous: The inferior vena cava was not well visualized. The inferior vena cava is normal in size with greater than 50% respiratory variability, suggesting right atrial pressure of 3 mmHg. IAS/Shunts: No atrial level shunt detected by color flow Doppler.  LEFT VENTRICLE PLAX 2D LVIDd:         4.77 cm  Diastology LVIDs:         3.08 cm  LV e' lateral:   7.83 cm/s LV PW:         1.15 cm  LV E/e' lateral: 11.6 LV IVS:        1.11 cm  LV e' medial:    5.87 cm/s LVOT diam:     2.00 cm  LV E/e' medial:  15.5 LV SV:         68 LV SV Index:   42 LVOT Area:     3.14 cm  RIGHT VENTRICLE RV Basal diam:  3.63 cm RV S prime:     12.40 cm/s TAPSE (M-mode): 3.6 cm LEFT ATRIUM             Index       RIGHT ATRIUM           Index LA diam:        4.80 cm 2.95 cm/m  RA Area:     15.10 cm LA Vol (A2C):   83.7 ml 51.42 ml/m RA Volume:   35.80 ml  21.99 ml/m LA Vol (A4C):   96.5 ml 59.28 ml/m LA Biplane Vol: 91.0 ml 55.90 ml/m  AORTIC VALVE                   PULMONIC VALVE AV Area (Vmax):    2.25 cm    PV Vmax:        0.92 m/s AV Area (Vmean):   2.51 cm    PV Peak grad:   3.4 mmHg AV Area (VTI):     2.46 cm    RVOT Peak grad: 5 mmHg AV Vmax:           127.00 cm/s AV Vmean:          86.450 cm/s AV VTI:  0.277 m AV Peak Grad:      6.5 mmHg AV Mean Grad:      3.5 mmHg LVOT Vmax:         90.90 cm/s LVOT Vmean:        69.000 cm/s LVOT VTI:          0.217 m LVOT/AV VTI ratio: 0.78  AORTA Ao Root diam: 3.50 cm MITRAL VALVE                TRICUSPID VALVE MV Area (PHT): 3.34 cm     TR Peak grad:   30.7 mmHg MV Decel Time: 227 msec     TR Vmax:        277.00 cm/s MV E velocity: 91.20 cm/s MV A velocity: 137.00 cm/s  SHUNTS MV E/A ratio:  0.67         Systemic VTI:  0.22 m                             Systemic Diam: 2.00 cm Debbe Odea MD Electronically signed by Debbe Odea MD Signature Date/Time: 01/21/2020/4:26:00 PM     Final    DG HIP OPERATIVE UNILAT W OR W/O PELVIS RIGHT  Result Date: 01/26/2020 CLINICAL DATA:  Portable operative images for right proximal femur ORIF. EXAM: OPERATIVE RIGHT HIP (WITH PELVIS IF PERFORMED) 5 VIEWS TECHNIQUE: Fluoroscopic spot image(s) were submitted for interpretation post-operatively. COMPARISON:  01/25/2020 FINDINGS: Five spot fluoro graphic operative images show placement of a long medullary rod through the femur supporting a compression screw. The proximal femur fracture components have been reduced, with the distal fracture component minimally displaced medial compared to the proximal component by 6-7 mm. Orthopedic hardware appears well seated and aligned. IMPRESSION: 1. Significant reduction of the proximal right femur fracture following ORIF. Electronically Signed   By: Amie Portland M.D.   On: 01/26/2020 18:41   DG HIP UNILAT WITH PELVIS 2-3 VIEWS RIGHT  Result Date: 01/25/2020 CLINICAL DATA:  Dislocated hip, right, initial encounter. Worsening right hip pain. EXAM: DG HIP (WITH OR WITHOUT PELVIS) 2-3V RIGHT COMPARISON:  Radiograph and CT 01/21/2020, radiograph 07/04/2019 FINDINGS: The previous incomplete subtrochanteric fracture of the right hip is now a complete fracture, displaced and significantly angulated with apex anterior angulation of 79 degrees. Fracture may be bisphosphonate related with cortical thickening noted on February 2021 exam. The femoral head remains seated in the acetabulum without dislocation. Moderate right hip osteoarthritis. Pubic rami are intact. IMPRESSION: 1. The previous incomplete subtrochanteric fracture is now a complete fracture, displaced with significant angulation. 2. Femoral head is well seated in the acetabulum without dislocation. Electronically Signed   By: Narda Rutherford M.D.   On: 01/25/2020 17:15   DG Hip Unilat W or Wo Pelvis 2-3 Views Right  Result Date: 01/21/2020 CLINICAL DATA:  Larey Seat, pain EXAM: DG HIP (WITH OR WITHOUT PELVIS)  2-3V RIGHT COMPARISON:  01/31/2017 FINDINGS: Frontal view of the pelvis as well as frontal and cross-table lateral views of the right hip are obtained. There is a nondisplaced transverse fracture through the proximal femoral diaphysis. Alignment is anatomic. There are no other acute displaced fractures. The hips are well aligned. Mild bilateral hip osteoarthritis. Sacroiliac joints are normal. IMPRESSION: 1. Nondisplaced transverse fracture through the proximal right femoral diaphysis. Fracture line involves an area of chronic focal cortical thickening, and likely reflects an acute fracture at a site of chronic stress. Electronically Signed   By: Casimiro Needle  Manson Passey M.D.   On: 01/21/2020 03:46    SIGNED: Kendell Bane, MD, FACP, FHM. [facility],  Pager (please use [email or web address] to page/[email or web address])  If 7PM-7AM, please contact night-coverage [email or web address] 01/27/2020, 11:42 AM

## 2020-01-27 NOTE — Plan of Care (Signed)

## 2020-01-27 NOTE — Progress Notes (Signed)
MD aware of low bps, new orders in.

## 2020-01-27 NOTE — Progress Notes (Signed)
Pharmacy Antibiotic Note  Alice Wallace is a 79 y.o. female admitted on 01/21/2020 with sepsis. Patient was initially on ceftriaxone and azithromycin then was switched to empiric cefazolin and PO levofloxacin (d/c'd 9/7). Pharmacy has been consulted for Vancomycin and Cefepime dosing.   9/1 DG chest: Density at the right lateral lung base may reflect focal lung consolidation and/or small effusion. 9/4 DG chest: Mild interval improvement. Decreased opacity at the right lung base consistent with improved pneumonia or atelectasis Day 7 abx, WBC trending back up, afebrile, Scr 1.31>1.14  Plan: -Will give Vancomycin IV 1750mg  LD x1 followed by vancomycin IV 750mg  q12h per dosing nomogram -Will give Cefepime 2g q12h -Monitor renal function and order vanc levels as clinically indicated -Goal trough 15-57mcg/mL  Height: 5\' 6"  (167.6 cm) Weight: 73.9 kg (163 lb) IBW/kg (Calculated) : 59.3  Temp (24hrs), Avg:97.4 F (36.3 C), Min:96.4 F (35.8 C), Max:98.4 F (36.9 C)  Recent Labs  Lab 01/23/20 0347 01/24/20 0427 01/25/20 0401 01/25/20 0858 01/26/20 0425 01/27/20 0503  WBC 10.3 10.0 17.4*  --  20.9* 32.3*  CREATININE 0.53 0.51 0.79  --  1.31* 1.14*  LATICACIDVEN  --   --   --  1.0  --   --     Estimated Creatinine Clearance: 41.1 mL/min (A) (by C-G formula based on SCr of 1.14 mg/dL (H)).    Allergies  Allergen Reactions  . Hydrocodone     Other reaction(s): Unknown  . Naproxen     Other reaction(s): Unknown  . Other Other (See Comments)    nuts  . Sulfamethoxazole-Trimethoprim     Other reaction(s): Unknown  . Tramadol     Other reaction(s): Unknown Pt has tolerated this med 1/5-1/7 with no issues    Antimicrobials this admission: 9/1 CTX >> 9/3 9/1 Azitrhomycin >> 9/3 9/4 Levofloxacin >> 9/7 9/6 Cefazolin >> 9/7 9/7 Cefepime >> 9/7 Vancomycin >>  Dose adjustments this admission: N/A  Microbiology results: 9/6 BCx: NGTD UCx: sent 9/2 MRSA PCR: positive  Thank  you for allowing pharmacy to be a part of this patient's care.  11/7, PharmD Pharmacy Resident  01/27/2020 1:00 PM

## 2020-01-27 NOTE — NC FL2 (Signed)
[place] MEDICAID FL2 LEVEL OF CARE SCREENING TOOL     IDENTIFICATION  Patient Name: Alice Wallace Birthdate: 03-Sep-1940 Sex: female Admission Date (Current Location): 01/21/2020  [facility] and [place] Number:  Chiropodist and Address:  [facility], [address], [place], [place] [address]      Provider Number: [id number]  Attending Physician Name and Address:  Kendell Bane, MD  Relative Name and Phone Number:       Current Level of Care: Hospital Recommended Level of Care: Skilled Nursing Facility Prior Approval Number:    Date Approved/Denied:   PASRR Number: [id number]  Discharge Plan: SNF    Current Diagnoses: Patient Active Problem List   Diagnosis Date Noted   Hip fracture, unspecified laterality, closed, initial encounter (HCC) 01/26/2020   COPD exacerbation (HCC) 01/21/2020   Non-STEMI (non-ST elevated myocardial infarction) (HCC) 01/21/2020   Acute respiratory failure with hypoxia (HCC)    Pneumonia due to COVID-19 virus 06/07/2019   Hypokalemia 06/07/2019   AKI (acute kidney injury) (HCC) 06/07/2019   Nonspeaking deaf 06/07/2019   Acute hypoxemic respiratory failure due to COVID-19 (HCC) 05/27/2019   COVID-19 virus infection 05/27/2019    Orientation RESPIRATION BLADDER Height & Weight     Self  Other (Comment) (non rebreather 15L, will wean) External catheter, Incontinent (placed 9/1) Weight: 163 lb (73.9 kg) Height:  5\' 6"  (167.6 cm)  BEHAVIORAL SYMPTOMS/MOOD NEUROLOGICAL BOWEL NUTRITION STATUS      Continent Diet (regular , thin liquids)  AMBULATORY STATUS COMMUNICATION OF NEEDS Skin   Extensive Assist Verbally                         Personal Care Assistance Level of Assistance  Feeding, Bathing, Dressing Bathing Assistance: Maximum assistance Feeding assistance: Independent Dressing Assistance: Maximum assistance     Functional Limitations Info  Sight, Hearing, Speech Sight Info:  Adequate Hearing Info: Adequate Speech Info: Adequate    SPECIAL CARE FACTORS FREQUENCY  PT (By licensed PT), OT (By licensed OT)     PT Frequency: 5x OT Frequency: 5x            Contractures Contractures Info: Not present    Additional Factors Info  Code Status, Allergies Code Status Info: Full Code Allergies Info: Hydrocodone, Naproxen, Other, Sulfamethoxazole-trimethoprim, Tramadol           Current Medications (01/27/2020):  This is the current hospital active medication list Current Facility-Administered Medications  Medication Dose Route Frequency Provider Last Rate Last Admin   acetaminophen (TYLENOL) tablet 650 mg  650 mg Oral Q6H PRN 03/28/2020, MD   650 mg at 01/25/20 1542   Or   acetaminophen (TYLENOL) suppository 650 mg  650 mg Rectal Q6H PRN 03/26/20, MD       acidophilus (RISAQUAD) capsule 1 capsule  1 capsule Oral TID Lyndle Herrlich A, MD   1 capsule at 01/27/20 0912   albuterol (PROVENTIL) (2.5 MG/3ML) 0.083% nebulizer solution 3 mL  3 mL Inhalation Q4H PRN 03/28/20, MD       alum & mag hydroxide-simeth (MAALOX/MYLANTA) 200-200-20 MG/5ML suspension 30 mL  30 mL Oral TID AC & HS 11-28-2000, MD   30 mL at 01/27/20 1238   aspirin EC tablet 81 mg  81 mg Oral Daily 03/28/20, MD   81 mg at 01/27/20 0848   atorvastatin (LIPITOR) tablet 80 mg  80 mg Oral Daily 03/28/20  R, MD   80 mg at 01/27/20 0849   calcium-vitamin D (OSCAL WITH D) 500-200 MG-UNIT per tablet 1 tablet  1 tablet Oral BID Lyndle Herrlich, MD   1 tablet at 01/27/20 0849   ceFEPIme (MAXIPIME) 2 g in sodium chloride 0.9 % 100 mL IVPB  2 g Intravenous Q12H Rauer, Samantha O, RPH 200 mL/hr at 01/27/20 1321 2 g at 01/27/20 1321   Chlorhexidine Gluconate Cloth 2 % PADS 6 each  6 each Topical Q0600 Lyndle Herrlich, MD   6 each at 01/25/20 0630   cholecalciferol (VITAMIN D3) tablet 5,000 Units  5,000 Units Oral Daily Lyndle Herrlich, MD   5,000 Units at 01/27/20  0848   clopidogrel (PLAVIX) tablet 75 mg  75 mg Oral Daily Leanora Ivanoff, PA-C   75 mg at 01/27/20 0912   dextromethorphan (DELSYM) 30 MG/5ML liquid 30 mg  30 mg Oral QHS PRN Lyndle Herrlich, MD   30 mg at 01/25/20 0858   diltiazem (CARDIZEM CD) 24 hr capsule 180 mg  180 mg Oral Daily Leanora Ivanoff, PA-C   180 mg at 01/27/20 1749   diphenhydrAMINE (BENADRYL) capsule 25 mg  25 mg Oral Q6H PRN Lyndle Herrlich, MD       docusate sodium (COLACE) capsule 100 mg  100 mg Oral BID Lyndle Herrlich, MD   100 mg at 01/26/20 2126   enoxaparin (LOVENOX) injection 40 mg  40 mg Subcutaneous Q24H Shahmehdi, Seyed A, MD       escitalopram (LEXAPRO) tablet 10 mg  10 mg Oral Daily Lyndle Herrlich, MD   10 mg at 01/27/20 0850   feeding supplement (ENSURE ENLIVE) (ENSURE ENLIVE) liquid 237 mL  237 mL Oral TID BM Lyndle Herrlich, MD   237 mL at 01/25/20 2213   ferrous sulfate tablet 325 mg  325 mg Oral BID WC Lyndle Herrlich, MD   325 mg at 01/27/20 0848   guaiFENesin (MUCINEX) 12 hr tablet 600 mg  600 mg Oral Daily Lyndle Herrlich, MD   600 mg at 01/27/20 0849   HYDROmorphone (DILAUDID) injection 1 mg  1 mg Intravenous Q3H PRN Lyndle Herrlich, MD   1 mg at 01/26/20 0104   ipratropium-albuterol (DUONEB) 0.5-2.5 (3) MG/3ML nebulizer solution 3 mL  3 mL Nebulization TID Lyndle Herrlich, MD   3 mL at 01/27/20 1313   isosorbide mononitrate (IMDUR) 24 hr tablet 30 mg  30 mg Oral Daily Lyndle Herrlich, MD   30 mg at 01/27/20 4496   lactated ringers infusion   Intravenous Continuous Jimmye Norman, NP 75 mL/hr at 01/27/20 0400 Rate Verify at 01/27/20 0400   loperamide (IMODIUM) capsule 2 mg  2 mg Oral Daily PRN Lyndle Herrlich, MD       loratadine (CLARITIN) tablet 10 mg  10 mg Oral Daily Lyndle Herrlich, MD   10 mg at 01/27/20 1055   magnesium hydroxide (MILK OF MAGNESIA) suspension 30 mL  30 mL Oral Daily PRN Lyndle Herrlich, MD       metoCLOPramide (REGLAN) tablet 5-10 mg  5-10 mg Oral Q8H PRN Lyndle Herrlich, MD       Or   metoCLOPramide (REGLAN) injection 5-10 mg  5-10 mg Intravenous Q8H PRN Lyndle Herrlich, MD       metoprolol succinate (TOPROL-XL) 24 hr tablet 25 mg  25 mg Oral Daily Leanora Ivanoff, PA-C   25 mg at 01/27/20 [phone]  morphine 2 MG/ML injection 2 mg  2 mg Intravenous Q4H PRN Lyndle Herrlich, MD   2 mg at 01/25/20 1234   mupirocin ointment (BACTROBAN) 2 % 1 application  1 application Nasal BID Lyndle Herrlich, MD   1 application at 01/27/20 1055   nitroGLYCERIN (NITROSTAT) SL tablet 0.4 mg  0.4 mg Sublingual Q5 min PRN Lyndle Herrlich, MD   0.4 mg at 01/24/20 0920   ondansetron (ZOFRAN) tablet 4 mg  4 mg Oral Q6H PRN Lyndle Herrlich, MD       Or   ondansetron [facility]) injection 4 mg  4 mg Intravenous Q6H PRN Lyndle Herrlich, MD   4 mg at 01/26/20 2158   ondansetron (ZOFRAN) tablet 4 mg  4 mg Oral Q6H PRN Lyndle Herrlich, MD       Or   ondansetron [facility]) injection 4 mg  4 mg Intravenous Q6H PRN Lyndle Herrlich, MD       pantoprazole (PROTONIX) EC tablet 40 mg  40 mg Oral Daily Lyndle Herrlich, MD   40 mg at 01/27/20 0848   potassium chloride SA (KLOR-CON) CR tablet 40 mEq  40 mEq Oral Daily Lyndle Herrlich, MD   40 mEq at 01/27/20 0849   tiZANidine (ZANAFLEX) tablet 4 mg  4 mg Oral Q8H PRN Lyndle Herrlich, MD   4 mg at 01/24/20 4166   traZODone (DESYREL) tablet 25 mg  25 mg Oral QHS PRN Lyndle Herrlich, MD       vancomycin Luna Kitchens) IVPB 1750 mg/350 mL  1,750 mg Intravenous Once RauerRobyne Peers, RPH 175 mL/hr at 01/27/20 1244 1,750 mg at 01/27/20 1244   vancomycin (VANCOREADY) IVPB 750 mg/150 mL  750 mg Intravenous Q12H Rauer, Robyne Peers, RPH         Discharge Medications: Please see discharge summary for a list of discharge medications.  Relevant Imaging Results:  Relevant Lab Results:   Additional Information AYT:[id number]  Maree Krabbe, LCSW

## 2020-01-27 NOTE — Evaluation (Signed)
Physical Therapy Evaluation Patient Details Name: Alice Wallace MRN: [id number] DOB: 12/01/1940 Today's Date: 01/27/2020   History of Present Illness  Per MD notes: Pt is a 79 y.o. caucasian female with a known history of COPD, hypertension, deafness and muteness, presented to the emergency room with acute onset of fall at her assisted living facility with subsequent right hip pain and significant respiratory distress.  She was thought to be in COPD exacerbation was given IV Solu-Medrol and nebulized albuterol as well as duo nebs by EMS on route to the hospital.  Pt found to have a proximal right femoral diaphysis fracture and is now s/p R hip IM nail.  MD assessment also includes: Acute on chronic respiratory failure with COPD exacerbation /bronchitis, cannot rule out pneumonia, h/o CHF, suspected NSTEMI, HTN, hypokalemia, and depression.    Clinical Impression  Pt was pleasant and motivated to participate during the session but was somewhat limited by pain.  Pt required only minimal physical assistance with bed mobility and transfers but once in standing had significant difficulty weight bearing through her RLE.  Pt encouraged via writing on a white board to increase her WB through the RLE and she was able to do so but with only minimal improvement.  Pt returned to sitting secondary to R hip pain prior to attempting ambulation and would likely have had significant difficulty doing so based on the limited WB through the RLE during static standing.  Pt on 45L HFNC with FiO2 70% during the session with SpO2 remaining in the 90s throughout and with HR WNL.  No signs of distress noted other than R hip pain during the session.  Pt will benefit from PT services in a SNF setting upon discharge to safely address deficits listed in patient problem list for decreased caregiver assistance and eventual return to PLOF.     Follow Up Recommendations SNF    Equipment Recommendations  Other (comment) (TBD at next  venue of care)    Recommendations for Other Services       Precautions / Restrictions Precautions Precautions: Fall Restrictions Weight Bearing Restrictions: Yes RLE Weight Bearing: Weight bearing as tolerated Other Position/Activity Restrictions: Per cardiology 01/27/20 OK for patient to participate with PT services without restrictions      Mobility  Bed Mobility Overal bed mobility: Needs Assistance Bed Mobility: Rolling;Supine to Sit;Sit to Supine Rolling: Min assist   Supine to sit: Min assist Sit to supine: Min assist   General bed mobility comments: Min A for trunk and BLE control  Transfers Overall transfer level: Needs assistance Equipment used: Rolling walker (2 wheeled) Transfers: Sit to/from Stand Sit to Stand: Min assist;From elevated surface         General transfer comment: Min A to stand with cues to initiate sequencing with limited weight bearing through the RLE that improved somewhat with cues  Ambulation/Gait             General Gait Details: Pt unable to tolerate standing long enough to attempt ambulation  Stairs            Wheelchair Mobility    Modified Rankin (Stroke Patients Only)       Balance Overall balance assessment: Needs assistance Sitting-balance support: Single extremity supported;Feet unsupported Sitting balance-Leahy Scale: Fair     Standing balance support: Bilateral upper extremity supported Standing balance-Leahy Scale: Fair Standing balance comment: Mod lean on the RW for support  Pertinent Vitals/Pain Pain Assessment: 0-10 Pain Score: 1  Pain Location: R hip Pain Intervention(s): Premedicated before session;Monitored during session    Home Living Family/patient expects to be discharged to:: Assisted living               Home Equipment: Walker - 4 wheels;Bedside commode      Prior Function Level of Independence: Needs assistance   Gait / Transfers  Assistance Needed: Mod Ind amb in ALF with a rollator, 2 falls in the last 6-9 months  ADL's / Homemaking Assistance Needed: Ind with dressing and assist from staff for bathing  Comments: 2-3L home O2     Hand Dominance   Dominant Hand: Right    Extremity/Trunk Assessment   Upper Extremity Assessment Upper Extremity Assessment: Generalized weakness    Lower Extremity Assessment Lower Extremity Assessment: Generalized weakness;RLE deficits/detail RLE: Unable to fully assess due to pain       Communication   Communication: Deaf;Other (comment) (mute, use white board)  Cognition Arousal/Alertness: Awake/alert Behavior During Therapy: WFL for tasks assessed/performed Overall Cognitive Status: Within Functional Limits for tasks assessed                                 General Comments: Pt is able to respond appropriately with thumbs up/down and head nodding. Used white board for more complexed questioning. pt is unable to write but seems alert and oriented.      General Comments      Exercises Other Exercises Other Exercises: Rolling left/right Other Exercises: Static sitting and standing for increased activity tolerance   Assessment/Plan    PT Assessment Patient needs continued PT services  PT Problem List Decreased strength;Decreased activity tolerance;Decreased balance;Decreased mobility;Decreased knowledge of use of DME;Pain       PT Treatment Interventions DME instruction;Gait training;Functional mobility training;Therapeutic activities;Therapeutic exercise;Balance training;Patient/family education    PT Goals (Current goals can be found in the Care Plan section)  Acute Rehab PT Goals Patient Stated Goal: non verbal PT Goal Formulation: Patient unable to participate in goal setting Time For Goal Achievement: 02/09/20 Potential to Achieve Goals: Good    Frequency BID   Barriers to discharge Inaccessible home environment;Decreased caregiver  support      Co-evaluation               AM-PAC PT "6 Clicks" Mobility  Outcome Measure Help needed turning from your back to your side while in a flat bed without using bedrails?: A Little Help needed moving from lying on your back to sitting on the side of a flat bed without using bedrails?: A Little Help needed moving to and from a bed to a chair (including a wheelchair)?: A Lot Help needed standing up from a chair using your arms (e.g., wheelchair or bedside chair)?: A Little Help needed to walk in hospital room?: Total Help needed climbing 3-5 steps with a railing? : Total 6 Click Score: 13    End of Session Equipment Utilized During Treatment: Gait belt;Oxygen Activity Tolerance: Patient limited by pain Patient left: in bed;with call bell/phone within reach;with bed alarm set Nurse Communication: Mobility status PT Visit Diagnosis: Unsteadiness on feet (R26.81);History of falling (Z91.81);Difficulty in walking, not elsewhere classified (R26.2);Muscle weakness (generalized) (M62.81);Pain Pain - Right/Left: Right Pain - part of body: Hip    Time: 1448-1856 PT Time Calculation (min) (ACUTE ONLY): 46 min   Charges:   PT Evaluation $PT Re-evaluation: 1 Re-eval  PT Treatments $Therapeutic Activity: 8-22 mins        D. Scott Ski Polich PT, DPT 01/27/20, 1:09 PM

## 2020-01-28 ENCOUNTER — Encounter: Payer: Self-pay | Admitting: Family Medicine

## 2020-01-28 DIAGNOSIS — Z515 Encounter for palliative care: Secondary | ICD-10-CM

## 2020-01-28 DIAGNOSIS — J9621 Acute and chronic respiratory failure with hypoxia: Secondary | ICD-10-CM | POA: Diagnosis present

## 2020-01-28 DIAGNOSIS — Z7189 Other specified counseling: Secondary | ICD-10-CM

## 2020-01-28 LAB — LACTIC ACID, PLASMA: Lactic Acid, Venous: 1.7 mmol/L (ref 0.5–1.9)

## 2020-01-28 LAB — BASIC METABOLIC PANEL
Anion gap: 12 (ref 5–15)
BUN: 40 mg/dL — ABNORMAL HIGH (ref 8–23)
CO2: 28 mmol/L (ref 22–32)
Calcium: 7.8 mg/dL — ABNORMAL LOW (ref 8.9–10.3)
Chloride: 94 mmol/L — ABNORMAL LOW (ref 98–111)
Creatinine, Ser: 0.89 mg/dL (ref 0.44–1.00)
GFR calc Af Amer: >60 mL/min (ref >60)
GFR calc non Af Amer: >60 mL/min (ref >60)
Glucose, Bld: 121 mg/dL — ABNORMAL HIGH (ref 70–99)
Potassium: 3 mmol/L — ABNORMAL LOW (ref 3.5–5.1)
Sodium: 134 mmol/L — ABNORMAL LOW (ref 135–145)

## 2020-01-28 LAB — CBC WITH DIFFERENTIAL/PLATELET
Abs Immature Granulocytes: 0.28 10*3/uL — ABNORMAL HIGH (ref 0.00–0.07)
Basophils Absolute: 0 10*3/uL (ref 0.0–0.1)
Basophils Relative: 0 %
Eosinophils Absolute: 0 10*3/uL (ref 0.0–0.5)
Eosinophils Relative: 0 %
HCT: 25.9 % — ABNORMAL LOW (ref 36.0–46.0)
Hemoglobin: 8.5 g/dL — ABNORMAL LOW (ref 12.0–15.0)
Immature Granulocytes: 1 %
Lymphocytes Relative: 3 %
Lymphs Abs: 0.9 10*3/uL (ref 0.7–4.0)
MCH: 30.2 pg (ref 26.0–34.0)
MCHC: 32.8 g/dL (ref 30.0–36.0)
MCV: 92.2 fL (ref 80.0–100.0)
Monocytes Absolute: 0.9 10*3/uL (ref 0.1–1.0)
Monocytes Relative: 3 %
Neutro Abs: 23.7 10*3/uL — ABNORMAL HIGH (ref 1.7–7.7)
Neutrophils Relative %: 93 %
Platelets: 263 10*3/uL (ref 150–400)
RBC: 2.81 MIL/uL — ABNORMAL LOW (ref 3.87–5.11)
RDW: 14.9 % (ref 11.5–15.5)
Smear Review: NORMAL
WBC: 25.8 10*3/uL — ABNORMAL HIGH (ref 4.0–10.5)
nRBC: 0 % (ref 0.0–0.2)

## 2020-01-28 MED ORDER — MORPHINE SULFATE (PF) 2 MG/ML IV SOLN
2.0000 mg | INTRAVENOUS | Status: DC | PRN
  Administered 2020-01-28 – 2020-02-02 (×4): 2 mg via INTRAVENOUS
  Filled 2020-01-28 (×4): qty 1

## 2020-01-28 MED ORDER — POTASSIUM CHLORIDE CRYS ER 20 MEQ PO TBCR
40.0000 meq | EXTENDED_RELEASE_TABLET | ORAL | Status: AC
  Administered 2020-01-28: 40 meq via ORAL
  Filled 2020-01-28: qty 2

## 2020-01-28 NOTE — Consult Note (Signed)
Consultation Note Date: 01/28/2020   Patient Name: Alice Wallace  DOB: 1940-07-15  MRN: [id number]  Age / Sex: 79 y.o., female  PCP: Devoria Glassing, NP Referring Physician: Pennie Banter, DO  Reason for Consultation: Establishing goals of care and Psychosocial/spiritual support  HPI/Patient Profile: 79 y.o. female  with past medical history of deaf/mute, COPD, HTN, [place] ALF, [place], likely depression takes Lexapro, likely GERD, takes Prilosec and Tums admitted on 01/21/2020 with right hip fracture with repair.   Clinical Assessment and Goals of Care: Alice Wallace is sitting in the bed in her room.  She will make an somewhat keep eye contact.  She appears acutely/chronically ill and somewhat frail.  She is known to be deaf/mute, but can make her basic needs known.  I offer her liquid, and she is able to hold her own cup and drink without signs and symptoms of aspiration.  She indicates that she is having pain at her right hip, and requests that her head be lowered.  There is no family at bedside at this time.  Conference with DSS social worker, [place].  Raynelle Fanning and I talked about Alice Wallace acute and chronic health concerns, including, but not limited to NSTEMI, respiratory issues (Alice Wallace is oxygen dependent) now at 75% on high flow, physical therapy evaluation and treatment, time for outcomes.  Noreene Larsson states that Alice Wallace has been a ward of the state since her sister passed away in April 24, 2018.  Alice Wallace does not have intellectual delays, but is deaf/mute.  We talked about time for outcomes.  At this point Alice Wallace continues to participate in care, clearly her goal is to continue to try for improvement.  Conference with attending, bedside nursing staff, transition of care team, physical therapist, related to patient condition, needs, goals of care, disposition.  PMT to  follow.   HCPOA   LEGAL GUARDIAN - Ms Wallace is a ward of the state.  DSS SW is [employer].    SUMMARY OF RECOMMENDATIONS   Continue to treat the treatable but no CPR or intubation Time for outcomes Short-term rehab if possible   Code Status/Advance Care Planning:  DNR  Symptom Management:   Per hospitalist, no additional needs at this time.  Palliative Prophylaxis:   Frequent Pain Assessment and Palliative Wound Care  Additional Recommendations (Limitations, Scope, Preferences):  Treat the treatable but no CPR or intubation  Psycho-social/Spiritual:   Desire for further Chaplaincy support:no  Additional Recommendations: Caregiving  Support/Resources and Education on Hospice  Prognosis:   Unable to determine, based on outcomes.  Guarded at this point.  Discharge Planning: To be determined, based on outcomes.      Primary Diagnoses: Present on Admission:  COPD exacerbation (HCC)  Non-STEMI (non-ST elevated myocardial infarction) (HCC)  Acute respiratory failure with hypoxia (HCC)  (Resolved) Acute hypoxemic respiratory failure due to COVID-19 [facility])  Hip fracture, unspecified laterality, closed, initial encounter (HCC)  Hypokalemia   I have reviewed the medical record, interviewed the patient  and family, and examined the patient. The following aspects are pertinent.  Past Medical History:  Diagnosis Date   COPD (chronic obstructive pulmonary disease) (HCC)    Deaf    Hypertension    Osteoporosis    Social History   Socioeconomic History   Marital status: Single    Spouse name: Not on file   Number of children: Not on file   Years of education: Not on file   Highest education level: Not on file  Occupational History   Not on file  Tobacco Use   Smoking status: Never Smoker   Smokeless tobacco: Never Used  Substance and Sexual Activity   Alcohol use: Not Currently   Drug use: Not Currently   Sexual activity: Not on file   Other Topics Concern   Not on file  Social History Narrative   Not on file   Social Determinants of Health   Financial Resource Strain:    Difficulty of Paying Living Expenses: Not on file  Food Insecurity:    Worried About Running Out of Food in the Last Year: Not on file   Ran Out of Food in the Last Year: Not on file  Transportation Needs:    Lack of Transportation (Medical): Not on file   Lack of Transportation (Non-Medical): Not on file  Physical Activity:    Days of Exercise per Week: Not on file   Minutes of Exercise per Session: Not on file  Stress:    Feeling of Stress : Not on file  Social Connections:    Frequency of Communication with Friends and Family: Not on file   Frequency of Social Gatherings with Friends and Family: Not on file   Attends Religious Services: Not on file   Active Member of Clubs or Organizations: Not on file   Attends Banker Meetings: Not on file   Marital Status: Not on file   History reviewed. No pertinent family history. Scheduled Meds:  acidophilus  1 capsule Oral TID   alum & mag hydroxide-simeth  30 mL Oral TID AC & HS   aspirin EC  81 mg Oral Daily   atorvastatin  80 mg Oral Daily   calcium-vitamin D  1 tablet Oral BID   cholecalciferol  5,000 Units Oral Daily   clopidogrel  75 mg Oral Daily   diltiazem  180 mg Oral Daily   docusate sodium  100 mg Oral BID   enoxaparin (LOVENOX) injection  40 mg Subcutaneous Q24H   escitalopram  10 mg Oral Daily   feeding supplement (ENSURE ENLIVE)  237 mL Oral TID BM   ferrous sulfate  325 mg Oral BID WC   furosemide  20 mg Intravenous Q12H   guaiFENesin  600 mg Oral Daily   ipratropium-albuterol  3 mL Nebulization TID   isosorbide mononitrate  30 mg Oral Daily   loratadine  10 mg Oral Daily   metoprolol succinate  25 mg Oral Daily   midodrine  5 mg Oral TID WC   pantoprazole  40 mg Oral Daily   potassium chloride SA  40 mEq Oral Daily    potassium chloride  40 mEq Oral Q4H   Continuous Infusions:  ceFEPime (MAXIPIME) IV 2 g (01/28/20 1217)   lactated ringers Stopped (01/27/20 1653)   vancomycin 750 mg (01/28/20 1101)   PRN Meds:.acetaminophen **OR** acetaminophen, albuterol, dextromethorphan, diphenhydrAMINE, HYDROmorphone (DILAUDID) injection, loperamide, magnesium hydroxide, metoCLOPramide **OR** metoCLOPramide (REGLAN) injection, morphine injection, nitroGLYCERIN, ondansetron **OR** ondansetron (ZOFRAN) IV, ondansetron **OR**  ondansetron (ZOFRAN) IV, tiZANidine, traZODone Medications Prior to Admission:  Prior to Admission medications   Medication Sig Start Date End Date Taking? Authorizing Provider  acetaminophen (TYLENOL) 325 MG tablet Take 650 mg by mouth in the morning, at noon, and at bedtime.   Yes [provider]  acetaminophen (TYLENOL) 500 MG tablet Take 500 mg by mouth every 8 (eight) hours as needed for moderate pain or fever.   Yes [provider]  albuterol (PROVENTIL) (2.5 MG/3ML) 0.083% nebulizer solution Take 2.5 mg by nebulization every 6 (six) hours as needed for wheezing or shortness of breath.   Yes [provider]  albuterol (VENTOLIN HFA) 108 (90 Base) MCG/ACT inhaler Inhale 2 puffs into the lungs every 4 (four) hours as needed for wheezing or shortness of breath.   Yes [provider]  alendronate (FOSAMAX) 70 MG tablet Take 70 mg by mouth once a week. Take with a full glass of water on an empty stomach.   Yes [provider]  aluminum-magnesium hydroxide-simethicone (MAALOX) 200-200-20 MG/5ML SUSP Take 30 mLs by mouth 4 (four) times daily -  before meals and at bedtime.   Yes [provider]  aspirin EC 81 MG tablet Take 81 mg by mouth daily. Swallow whole.   Yes [provider]  Calcium Carbonate-Vitamin D3 (CALCIUM 600-D) 600-400 MG-UNIT TABS Take 1 tablet by mouth 2 (two) times daily.   Yes [provider]  cetirizine  (ZYRTEC) 10 MG tablet Take 10 mg by mouth daily.   Yes [provider]  Cholecalciferol (VITAMIN D) 125 MCG (5000 UT) CAPS Take 5,000 Units by mouth daily.   Yes [provider]  diclofenac sodium (VOLTAREN) 1 % GEL Apply 2 g topically 3 (three) times daily. 01/03/19  Yes [provider]  diltiazem (CARDIZEM CD) 240 MG 24 hr capsule Take 240 mg by mouth daily. 01/11/19  Yes [provider]  diphenhydrAMINE (BENADRYL) 25 MG tablet Take 25 mg by mouth every 6 (six) hours as needed for allergies.   Yes [provider]  escitalopram (LEXAPRO) 10 MG tablet Take 10 mg by mouth at bedtime. 06/08/16  Yes [provider]  feeding supplement, ENSURE ENLIVE, (ENSURE ENLIVE) LIQD Take 237 mLs by mouth 3 (three) times daily between meals. 06/10/19  Yes Enedina Finner, MD  ferrous sulfate 325 (65 FE) MG tablet Take 325 mg by mouth 2 (two) times daily with a meal.    Yes [provider]  Fluticasone-Salmeterol (ADVAIR) 100-50 MCG/DOSE AEPB Inhale 1 puff into the lungs 2 (two) times daily.   Yes [provider]  furosemide (LASIX) 20 MG tablet Take 20 mg by mouth daily.   Yes [provider]  guaiFENesin (MUCINEX) 600 MG 12 hr tablet Take 600 mg by mouth daily.   Yes [provider]  guaifenesin (ROBITUSSIN) 100 MG/5ML syrup Take 300 mg by mouth 4 (four) times daily as needed for cough.   Yes [provider]  loperamide (IMODIUM A-D) 2 MG tablet Take 2 mg by mouth daily as needed for diarrhea or loose stools.   Yes [provider]  magnesium hydroxide (MILK OF MAGNESIA) 400 MG/5ML suspension Take 30 mLs by mouth daily as needed for mild constipation.   Yes [provider]  metolazone (ZAROXOLYN) 2.5 MG tablet Take 2.5 mg by mouth 2 (two) times daily.   Yes [provider]  omeprazole (PRILOSEC) 20 MG capsule Take 20 mg by mouth daily.   Yes [provider]  ondansetron (ZOFRAN) 4 MG tablet  Take 4 mg by mouth every 4 (four) hours as needed for nausea or vomiting.   Yes [provider]  potassium chloride SA (KLOR-CON) 20 MEQ tablet Take 40 mEq by mouth daily.   Yes [provider]  tiZANidine (ZANAFLEX) 4 MG tablet Take 4 mg by mouth daily.   Yes [provider]  tolnaftate (ANTIFUNGAL) 1 % cream Apply 1 application topically as needed.   Yes [provider]  traMADol (ULTRAM) 50 MG tablet Take 50 mg by mouth every 8 (eight) hours.   Yes [provider]  azithromycin (ZITHROMAX) 250 MG tablet Daily for 9 days Patient not taking: Reported on 01/21/2020 06/11/19   Enedina FinnerPatel, Sona, MD  Multiple Vitamin (MULTIVITAMIN WITH MINERALS) TABS tablet Take 1 tablet by mouth daily. Patient not taking: Reported on 01/21/2020 06/11/19   Enedina FinnerPatel, Sona, MD  predniSONE (DELTASONE) 10 MG tablet Take 3 tablets (30 mg total) by mouth daily with breakfast. Take 30 mg day 1 then  -- 30mg  day 2 and so on  --20 mg  --20mg   --20mg   -10 mg  --10mg   --10mg  then stop Patient not taking: Reported on 01/21/2020 06/11/19   Enedina FinnerPatel, Sona, MD   Allergies  Allergen Reactions   Hydrocodone     Other reaction(s): Unknown   Naproxen     Other reaction(s): Unknown   Other Other (See Comments)    nuts   Sulfamethoxazole-Trimethoprim     Other reaction(s): Unknown   Tramadol     Other reaction(s): Unknown Pt has tolerated this med 1/5-1/7 with no issues   Review of Systems  Unable to perform ROS: Patient nonverbal    Physical Exam Vitals and nursing note reviewed.  Constitutional:      General: She is not in acute distress.    Appearance: She is normal weight. She is ill-appearing.  HENT:     Head: Normocephalic and atraumatic.  Cardiovascular:     Rate and Rhythm: Normal rate.  Pulmonary:     Effort: Pulmonary effort is normal. No tachypnea.     Comments: High flow nasal cannula, no accessory muscle use Musculoskeletal:     Comments: Recent right hip surgery   Skin:    General: Skin is warm and dry.  Neurological:     Mental Status: She is alert.     Comments: Patient deaf/mute     Vital Signs: BP (!) 114/55 (BP Location: Left Arm)    Pulse (!) 102    Temp 98.2 F (36.8 C) (Oral)    Resp 20    Ht 5\' 6"  (1.676 m)    Wt 76.2 kg    SpO2 99%    BMI 27.12 kg/m  Pain Scale: 0-10   Pain Score: 0-No pain   SpO2: SpO2: 99 % O2 Device:SpO2: 99 % O2 Flow Rate: .O2 Flow Rate (L/min): 45 L/min  IO: Intake/output summary:   Intake/Output Summary (Last 24 hours) at 01/28/2020 1223 Last data filed at 01/28/2020 0600 Gross per 24 hour  Intake 1357.21 ml  Output 200 ml  Net 1157.21 ml    LBM: Last BM Date: 01/27/20 Baseline Weight: Weight: 55.9 kg Most recent weight: Weight: 76.2 kg     Palliative Assessment/Data:   Flowsheet Rows     Most Recent Value  Intake Tab  Referral Department Hospitalist  Unit at Time of Referral Med/Surg Unit  Palliative Care Primary Diagnosis Trauma  Date Notified 01/27/20  Palliative Care Type New Palliative  care  Reason for referral Clarify Goals of Care  Date of Admission 01/21/20  Date first seen by Palliative Care 01/28/20  # of days Palliative referral response time 1 Day(s)  # of days IP prior to Palliative referral 6  Clinical Assessment  Palliative Performance Scale Score 30%  Pain Max last 24 hours Not able to report  Pain Min Last 24 hours Not able to report  Dyspnea Max Last 24 Hours Not able to report  Dyspnea Min Last 24 hours Not able to report  Psychosocial & Spiritual Assessment  Palliative Care Outcomes      Time In: 0950 Time Out: 1100 Time Total: 70 minutes  Greater than 50%  of this time was spent counseling and coordinating care related to the above assessment and plan.  Signed by: Katheran Awe, NP   Please contact Palliative Medicine Team phone at [phone] for questions and concerns.  For individual provider: See Loretha Stapler

## 2020-01-28 NOTE — Hospital Course (Addendum)
TR[facility] assumed care on 01/28/2020, patient out of ICU.  HPI and Hospital Course summarized below:  79 y.o. Caucasian female with a known history of COPD, hypertension, deafness and muteness, (can read lips and written communications) presented to the ED on 01/21/2020 after having a fall at her assisted living facility with subsequent right hip pain and significant respiratory distress and hypoxia with O2 sat in 70's on her usual 2 L/min oxygen.   Treated with Duoneb, albuterol neb and IV Solu-medrol by EMS for presumed COPD exacerbation.    In the ED, tachycardic HR 106, tachypneic RR 23, O2 sat 76% on room air, improved to 93-96% on 6 L/min.  CTA was negative for PE, but showed RLL atelectasis vs pneumonia and advanced emphysema.  Imaging consistent with R proximal femur fracture.   She is status post right hip ORIF on 01/26/2020 with [name].  Patient has been working with physical therapy.  To bee seen in follow up in 1 week of discharge.  She developed significant respiratory distress post-operatively, and had ongoing requirement for heated HFNC at rates of 40-50 L/min for several days.  Pulmonology was consulted.

## 2020-01-28 NOTE — Progress Notes (Signed)
Physical Therapy Treatment Patient Details Name: Alice Wallace MRN: [id number] DOB: 1941-03-22 Today's Date: 01/28/2020    History of Present Illness Per MD notes: Pt is a 79 y.o. caucasian female with a known history of COPD, hypertension, deafness and muteness, presented to the emergency room with acute onset of fall at her assisted living facility with subsequent right hip pain and significant respiratory distress.  She was thought to be in COPD exacerbation was given IV Solu-Medrol and nebulized albuterol as well as duo nebs by EMS on route to the hospital.  Pt found to have a proximal right femoral diaphysis fracture and is now s/p R hip IM nail.  MD assessment also includes: Acute on chronic respiratory failure with COPD exacerbation /bronchitis, cannot rule out pneumonia, h/o CHF, suspected NSTEMI, HTN, hypokalemia, and depression.    PT Comments    Participated in exercises as described below.  She declined sitting or further mobility this session shaking her head no when written on board.   Will continue as appropriate tomorrow.  Follow Up Recommendations  SNF     Equipment Recommendations       Recommendations for Other Services       Precautions / Restrictions Precautions Precautions: Fall Restrictions Weight Bearing Restrictions: Yes RLE Weight Bearing: Weight bearing as tolerated Other Position/Activity Restrictions: Per cardiology 01/27/20 OK for patient to participate with PT services without restrictions    Mobility  Bed Mobility Overal bed mobility: Needs Assistance Bed Mobility: Rolling;Supine to Sit;Sit to Supine Rolling: Min assist   Supine to sit: Min assist Sit to supine: Min assist   General bed mobility comments: declined  Transfers   Equipment used: Rolling walker (2 wheeled);None Transfers: Sit to/from Stand Sit to Stand: Mod assist;Max assist         General transfer comment: attemtped standing with RW but unable.  Frontal assist given with  belt and she is able to stand for about 15 seconds before initiating sitting  Ambulation/Gait             General Gait Details: Unable   Stairs             Wheelchair Mobility    Modified Rankin (Stroke Patients Only)       Balance Overall balance assessment: Needs assistance Sitting-balance support: Single extremity supported;Feet unsupported Sitting balance-Leahy Scale: Fair     Standing balance support: Bilateral upper extremity supported Standing balance-Leahy Scale: Poor                              Cognition Arousal/Alertness: Awake/alert Behavior During Therapy: WFL for tasks assessed/performed Overall Cognitive Status: Within Functional Limits for tasks assessed                                        Exercises General Exercises - Lower Extremity Ankle Circles/Pumps: AROM;Both;Supine;20 reps Heel Slides: AAROM;Supine;Both;20 reps Hip ABduction/ADduction: Supine;AAROM;Both;20 reps Straight Leg Raises: AAROM;Supine;Both;20 reps (due to pain)    General Comments        Pertinent Vitals/Pain Pain Assessment: Faces Faces Pain Scale: Hurts even more Pain Location: R hip Pain Descriptors / Indicators: Grimacing;Moaning Pain Intervention(s): Monitored during session;Repositioned    Home Living                      Prior Function  PT Goals (current goals can now be found in the care plan section) Progress towards PT goals: Progressing toward goals    Frequency    BID      PT Plan Current plan remains appropriate    Co-evaluation              AM-PAC PT "6 Clicks" Mobility   Outcome Measure  Help needed turning from your back to your side while in a flat bed without using bedrails?: A Little Help needed moving from lying on your back to sitting on the side of a flat bed without using bedrails?: A Little Help needed moving to and from a bed to a chair (including a wheelchair)?:  Total Help needed standing up from a chair using your arms (e.g., wheelchair or bedside chair)?: A Lot Help needed to walk in hospital room?: Total Help needed climbing 3-5 steps with a railing? : Total 6 Click Score: 11    End of Session Equipment Utilized During Treatment: Gait belt;Oxygen Activity Tolerance: Patient limited by pain Patient left: in bed;with call bell/phone within reach;with bed alarm set;with nursing/sitter in room Nurse Communication: Mobility status;Other (comment) Pain - Right/Left: Right Pain - part of body: Hip     Time: 1355-1403 PT Time Calculation (min) (ACUTE ONLY): 8 min  Charges:  $Therapeutic Exercise: 8-22 mins $Therapeutic Activity: 23-37 mins                    Danielle Dess, PTA 01/28/20, 2:57 PM

## 2020-01-28 NOTE — Progress Notes (Signed)
PROGRESS NOTE    Alice Wallace   AVW:[id number]  DOB: July 15, 1940  PCP: Devoria Glassing, NP    DOA: 01/21/2020 LOS: 7   Brief Narrative   [facility] assumed care on 01/28/2020, patient out of ICU.  HPI and Hospital Course summarized below:  79 y.o. Caucasian female with a known history of COPD, hypertension, deafness and muteness, (can read lips and written communications) presented to the ED on 01/21/2020 after having a fall at her assisted living facility with subsequent right hip pain and significant respiratory distress and hypoxia with O2 sat in 70's on her usual 2 L/min oxygen.   Treated with Duoneb, albuterol neb and IV Solu-medrol by EMS for presumed COPD exacerbation.    In the ED, tachycardic HR 106, tachypneic RR 23, O2 sat 76% on room air, improved to 93-96% on 6 L/min.  CTA was negative for PE, but showed RLL atelectasis vs pneumonia and advanced emphysema.  Imaging consistent with R proximal femur fracture.   She is status post right hip ORIF on 01/26/2020 with [name].            Assessment & Plan   Principal Problem:   Acute respiratory failure with hypoxia and hypercapnia (HCC) Active Problems:   COPD exacerbation (HCC)   Non-STEMI (non-ST elevated myocardial infarction) (HCC)   Hypokalemia   Hip fracture, unspecified laterality, closed, initial encounter (HCC)   Goals of care, counseling/discussion   Palliative care by specialist  Acute respiratory failure with hypoxia and hypercapnia -present on admission, due to acute heart failure in the setting of non-STEMI with acute pulmonary edema and renal failure.  Initially admitted to ICU due to high risk for requiring intubation.  Clinically improved and care assumed by [facility] on 9/8. --Continue supplemental oxygen as needed, maintain O2 sat greater than 88%, wean as tolerated --Patient reportedly does not tolerate BiPAP, continue HFNC  Community-acquired pneumonia - present on admission, RLL.  Initially respiratory failure  attributed to cardiac issues so antibiotics were held.  Patient developed significant leukocytosis, and repeat chest xray on 9/7 showed stable bibasilar opacities.  Started on Vanc/Cefepime on 9/7, had received couple days antibiotics on admission.   --Continue current antibiotics and plan to de-escalate tomorrow if improving  COPD with acute exacerbation - present on admission with hypercapnic respiratory failure as above.  Was treated with IV steroids, course completed. --Continue DuoNebs 3 times daily --Mucinex  Non-STEMI -present on admission, troponin peaked over 4k and downtrended.  Treating medically, heparin drip completed on 9/3. Acute diastolic CHF -likely secondary to non-STEMI.  Echo on 9/1 showed EF 60 to 65%, grade 1 diastolic dysfunction, no wall motion abnormalities. Net IO Since Admission: -3,980.09 mL [01/28/20 1557] --Cardiology following --Follow-up with Dr. [name] 1 week after discharge --Continue aspirin, Plavix, high intensity statin, Imdur --Cardizem CD was reduced to 180 mg --Metoprolol succinate 25 mg daily was added --Consider addition of ACE inhibitor at discharge if renal function and blood pressure tolerate --Continue IV Lasix 20 mg BID with close monitoring of renal function and electrolytes --Continue midodrine for BP support to tolerate above meds (maintain MAP greater than 65)  Acute Kidney Injury - POA, resolved.  Monitor BMP daily.    Right hip fracture -present on admission, sustained during fall at ALF.  Underwent ORIF 01/26/2020 with [name]. --Work with PT --Weightbearing as tolerated once medically stable --Per Ortho, okay to resume anticoagulation  GERD -continue oral Protonix daily  Depression/anxiety -continue Lexapro  Osteoporosis -takes Fosamax weekly.  Continue  calcium and vitamin D supplements  History of iron deficiency -continue oral iron supplement BID with meals  Allergic rhinitis -continue loratadine   Patient BMI: Body mass  index is 27.12 kg/m.   DVT prophylaxis: enoxaparin (LOVENOX) injection 40 mg Start: 01/27/20 2200 SCDs Start: 01/26/20 2057   Diet:  Diet Orders (From admission, onward)    Start     Ordered   01/26/20 2057  Diet regular Room service appropriate? Yes; Fluid consistency: Thin  Diet effective now       Question Answer Comment  Room service appropriate? Yes   Fluid consistency: Thin      01/26/20 2056            Code Status: DNR    Subjective 01/28/20    Patient seen at bedside with PT present this morning.  Patient reports having some chills and ongoing shortness of breath.  Also reports she has a sore throat.  Denies chest pain.  No other acute complaints and no acute events reported.   Disposition Plan & Communication   Status is: Inpatient  Remains inpatient appropriate because:IV treatments appropriate due to intensity of illness or inability to take PO   Dispo: The patient is from: ALF              Anticipated d/c is to: SNF              Anticipated d/c date is: 2 days              Patient currently is not medically stable to d/c.    Family Communication: none at bedside, will attempt to call. Patient's legal guardian is Ms. Theodosia Blender [phone]   Consults, Procedures, Significant Events   Consultants:   Cardiology  Palliative care  Orthopedics  Procedures:   R hip ORIF on 01/26/20  Antimicrobials:   Vancomycin, Cefepime 9/7 >>   Objective   Vitals:   01/28/20 0850 01/28/20 0955 01/28/20 1145 01/28/20 1456  BP: (!) 115/57  (!) 114/55   Pulse: 75  (!) 102   Resp:   20   Temp: 97.9 F (36.6 C)  98.2 F (36.8 C)   TempSrc: Oral  Oral   SpO2: (!) 85% 92% 99% 95%  Weight:      Height:        Intake/Output Summary (Last 24 hours) at 01/28/2020 1533 Last data filed at 01/28/2020 1346 Gross per 24 hour  Intake 1597.21 ml  Output 200 ml  Net 1397.21 ml   Filed Weights   01/24/20 0538 01/25/20 0500 01/28/20 0313  Weight: 68.5 kg 73.9  kg 76.2 kg    Physical Exam:  General exam: awake, alert, no acute distress Respiratory system: diminished bases, on heated HFNC, no wheezes appreciated, mildly increased respiratory effort. Cardiovascular system: normal S1/S2, RRR, no pedal edema.   Gastrointestinal system: soft, NT, ND, +bowel sounds. Extremities: moves all, no cyanosis, normal tone Skin: dry, intact, normal temperature, normal color Psychiatry: normal mood, congruent affect  Labs   Data Reviewed: I have personally reviewed following labs and imaging studies  CBC: Recent Labs  Lab 01/24/20 0427 01/25/20 0401 01/26/20 0425 01/27/20 0503 01/28/20 0422  WBC 10.0 17.4* 20.9* 32.3* 25.8*  NEUTROABS  --   --   --   --  23.7*  HGB 9.4* 10.0* 11.0* 9.7* 8.5*  HCT 28.7* 29.2* 33.9* 28.3* 25.9*  MCV 92.6 88.0 91.6 91.0 92.2  PLT 283 407* 465* 361 263   Basic Metabolic Panel:  Recent Labs  Lab 01/24/20 0427 01/25/20 0401 01/26/20 0425 01/27/20 0503 01/28/20 0422  NA 138 134* 136 133* 134*  K 3.5 3.6 4.0 4.1 3.0*  CL 101 99 91* 93* 94*  CO2 28 27 31 30 28   GLUCOSE 134* 118* 137* 146* 121*  BUN 12 15 26* 43* 40*  CREATININE 0.51 0.79 1.31* 1.14* 0.89  CALCIUM 8.8* 8.7* 9.0 8.1* 7.8*   GFR: Estimated Creatinine Clearance: 53.5 mL/min (by C-G formula based on SCr of 0.89 mg/dL). Liver Function Tests: Recent Labs  Lab 01/22/20 1556  AST 37  ALT 30  ALKPHOS 53  BILITOT 0.6  PROT 5.4*  ALBUMIN 3.1*   No results for input(s): LIPASE, AMYLASE in the last 168 hours. No results for input(s): AMMONIA in the last 168 hours. Coagulation Profile: No results for input(s): INR, PROTIME in the last 168 hours. Cardiac Enzymes: No results for input(s): CKTOTAL, CKMB, CKMBINDEX, TROPONINI in the last 168 hours. BNP (last 3 results) No results for input(s): PROBNP in the last 8760 hours. HbA1C: No results for input(s): HGBA1C in the last 72 hours. CBG: No results for input(s): GLUCAP in the last 168  hours. Lipid Profile: No results for input(s): CHOL, HDL, LDLCALC, TRIG, CHOLHDL, LDLDIRECT in the last 72 hours. Thyroid Function Tests: No results for input(s): TSH, T4TOTAL, FREET4, T3FREE, THYROIDAB in the last 72 hours. Anemia Panel: No results for input(s): VITAMINB12, FOLATE, FERRITIN, TIBC, IRON, RETICCTPCT in the last 72 hours. Sepsis Labs: Recent Labs  Lab 01/25/20 0858 01/27/20 0957 01/27/20 1247 01/28/20 0909  PROCALCITON <0.10 0.29  --   --   LATICACIDVEN 1.0 2.3* 2.0* 1.7    Recent Results (from the past 240 hour(s))  SARS Coronavirus 2 by RT PCR (hospital order, performed in [facility] hospital lab) Nasopharyngeal Nasopharyngeal Swab     Status: None   Collection Time: 01/21/20  2:53 AM   Specimen: Nasopharyngeal Swab  Result Value Ref Range Status   SARS Coronavirus 2 NEGATIVE NEGATIVE Final    Comment: (NOTE) SARS-CoV-2 target nucleic acids are NOT DETECTED.  The SARS-CoV-2 RNA is generally detectable in upper and lower respiratory specimens during the acute phase of infection. The lowest concentration of SARS-CoV-2 viral copies this assay can detect is 250 copies / mL. A negative result does not preclude SARS-CoV-2 infection and should not be used as the sole basis for treatment or other patient management decisions.  A negative result may occur with improper specimen collection / handling, submission of specimen other than nasopharyngeal swab, presence of viral mutation(s) within the areas targeted by this assay, and inadequate number of viral copies (<250 copies / mL). A negative result must be combined with clinical observations, patient history, and epidemiological information.  Fact Sheet for Patients:   BoilerBrush.com.cyhttps://www.fda.gov/media/136312/download  Fact Sheet for Healthcare Providers: https://pope.com/https://www.fda.gov/media/136313/download  This test is not yet approved or  cleared by the [place] FDA and has been authorized for detection and/or diagnosis of  SARS-CoV-2 by FDA under an Emergency Use Authorization (EUA).  This EUA will remain in effect (meaning this test can be used) for the duration of the COVID-19 declaration under Section 564(b)(1) of the Act, 21 U.S.C. section 360bbb-3(b)(1), unless the authorization is terminated or revoked sooner.  Performed at [facility], [address] [place], [place] [address]   MRSA PCR Screening     Status: Abnormal   Collection Time: 01/22/20  5:15 PM   Specimen: Nasopharyngeal  Result Value Ref Range Status  MRSA by PCR POSITIVE (A) NEGATIVE Final    Comment:        The GeneXpert MRSA Assay (FDA approved for NASAL specimens only), is one component of a comprehensive MRSA colonization surveillance program. It is not intended to diagnose MRSA infection nor to guide or monitor treatment for MRSA infections. RESULT CALLED TO, READ BACK BY AND VERIFIED WITH: BEBRA EMBDEN 01/22/20 AT 2211 BY ACR Performed at [facility], [address] [place], [place] [address]   CULTURE, BLOOD (ROUTINE X 2) w Reflex to ID Panel     Status: None (Preliminary result)   Collection Time: 01/26/20  9:17 AM   Specimen: BLOOD  Result Value Ref Range Status   Specimen Description BLOOD LEFT ANTECUBITAL  Final   Special Requests   Final    BOTTLES DRAWN AEROBIC AND ANAEROBIC Blood Culture adequate volume   Culture   Final    NO GROWTH 2 DAYS Performed at [facility], [address], [place], [place] [address]    Report Status PENDING  Incomplete  CULTURE, BLOOD (ROUTINE X 2) w Reflex to ID Panel     Status: None (Preliminary result)   Collection Time: 01/26/20  9:22 AM   Specimen: BLOOD  Result Value Ref Range Status   Specimen Description BLOOD RIGHT ANTECUBITAL  Final   Special Requests   Final    BOTTLES DRAWN AEROBIC AND ANAEROBIC Blood Culture adequate volume   Culture   Final    NO GROWTH 2 DAYS Performed at [facility], [address] [place],  [place] [address]    Report Status PENDING  Incomplete  Urine Culture     Status: None (Preliminary result)   Collection Time: 01/27/20  1:14 PM   Specimen: Urine, Random  Result Value Ref Range Status   Specimen Description   Final    URINE, RANDOM Performed at [facility], [address], [place], [place] [address]    Special Requests   Final    NONE Performed at [facility], [address], [place], [place] [address]    Culture   Final    CULTURE REINCUBATED FOR BETTER GROWTH Performed at [facility], [address], [place], [place] [address]    Report Status PENDING  Incomplete      Imaging Studies   DG Chest 2 View  Result Date: 01/27/2020 CLINICAL DATA:  Shortness of breath. EXAM: CHEST - 2 VIEW COMPARISON:  January 26, 2020. FINDINGS: Stable cardiomegaly. Stable old bilateral rib fractures. Severe emphysematous disease is noted in both lungs, right greater than left. No definite pneumothorax is noted. No definite pleural effusion is noted. Stable bibasilar opacities are noted which may represent pneumonia or possibly edema. Small bilateral pleural effusions may be present. IMPRESSION: Stable bibasilar opacities are noted which may represent pneumonia or possibly edema. Stable severe emphysematous disease is noted in both lungs, right greater than left. Small bilateral pleural effusions may be present. Aortic Atherosclerosis (ICD10-I70.0) and Emphysema (ICD10-J43.9). Electronically Signed   By: Lupita Raider M.D.   On: 01/27/2020 13:55   DG HIP OPERATIVE UNILAT W OR W/O PELVIS RIGHT  Result Date: 01/26/2020 CLINICAL DATA:  Portable operative images for right proximal femur ORIF. EXAM: OPERATIVE RIGHT HIP (WITH PELVIS IF PERFORMED) 5 VIEWS TECHNIQUE: Fluoroscopic spot image(s) were submitted for interpretation post-operatively. COMPARISON:  01/25/2020 FINDINGS: Five spot fluoro graphic operative images show placement of a long medullary rod through the femur  supporting a  compression screw. The proximal femur fracture components have been reduced, with the distal fracture component minimally displaced medial compared to the proximal component by 6-7 mm. Orthopedic hardware appears well seated and aligned. IMPRESSION: 1. Significant reduction of the proximal right femur fracture following ORIF. Electronically Signed   By: Amie Portland M.D.   On: 01/26/2020 18:41     Medications   Scheduled Meds: . acidophilus  1 capsule Oral TID  . alum & mag hydroxide-simeth  30 mL Oral TID AC & HS  . aspirin EC  81 mg Oral Daily  . atorvastatin  80 mg Oral Daily  . calcium-vitamin D  1 tablet Oral BID  . cholecalciferol  5,000 Units Oral Daily  . clopidogrel  75 mg Oral Daily  . diltiazem  180 mg Oral Daily  . docusate sodium  100 mg Oral BID  . enoxaparin (LOVENOX) injection  40 mg Subcutaneous Q24H  . escitalopram  10 mg Oral Daily  . feeding supplement (ENSURE ENLIVE)  237 mL Oral TID BM  . ferrous sulfate  325 mg Oral BID WC  . furosemide  20 mg Intravenous Q12H  . guaiFENesin  600 mg Oral Daily  . ipratropium-albuterol  3 mL Nebulization TID  . isosorbide mononitrate  30 mg Oral Daily  . loratadine  10 mg Oral Daily  . metoprolol succinate  25 mg Oral Daily  . midodrine  5 mg Oral TID WC  . pantoprazole  40 mg Oral Daily  . potassium chloride SA  40 mEq Oral Daily  . potassium chloride  40 mEq Oral Q4H   Continuous Infusions: . ceFEPime (MAXIPIME) IV 2 g (01/28/20 1217)  . lactated ringers Stopped (01/27/20 1653)  . vancomycin 750 mg (01/28/20 1101)       LOS: 7 days    Time spent: 30 minutes    Pennie Banter, DO [facility]  01/28/2020, 3:33 PM    If 7PM-7AM, please contact night-coverage. How to contact the [facility] Attending or Consulting provider 7A - 7P or covering provider during after hours 7P -7A, for this patient?    1. Check the care team in [facility] and look for a) attending/consulting [facility] provider listed and b) the  [facility] team listed 2. Log into [email or web address] and use [place]'s universal password to access. If you do not have the password, please contact the hospital operator. 3. Locate the [facility] provider you are looking for under [facility] and page to a number that you can be directly reached. 4. If you still have difficulty reaching the provider, please page the [facility] (Director on Call) for the Hospitalists listed on amion for assistance.

## 2020-01-28 NOTE — Plan of Care (Signed)

## 2020-01-28 NOTE — Progress Notes (Addendum)
Pharmacy Antibiotic Note  Alice Wallace is a 79 y.o. female admitted on 01/21/2020 with sepsis. Patient was initially on ceftriaxone and azithromycin then was switched to empiric cefazolin and PO levofloxacin (d/c'd 9/7). Pharmacy has been consulted for Vancomycin and Cefepime dosing. Initial LD of vancomycin given ~3 hours late.   9/1 DG chest: Density at the right lateral lung base may reflect focal lung consolidation and/or small effusion. 9/4 DG chest: Mild interval improvement. Decreased opacity at the right lung base consistent with improved pneumonia or atelectasis Day 8 total abx; on Day 2 cefepime and Vanc, WBC trending down after broadening abx, afebrile, Scr 1.31>1.14>0.89  Plan: -Continue vancomycin IV 750mg  q12h per dosing nomogram -Continue Cefepime 2g q12h -Monitor renal function and order vanc level prior to 5th dose (prior to AM dose on 9/9) -Goal trough 15-52mcg/mL  Height: 5\' 6"  (167.6 cm) Weight: 76.2 kg (168 lb) IBW/kg (Calculated) : 59.3  Temp (24hrs), Avg:98 F (36.7 C), Min:97.7 F (36.5 C), Max:98.4 F (36.9 C)  Recent Labs  Lab 01/24/20 0427 01/25/20 0401 01/25/20 0858 01/26/20 0425 01/27/20 0503 01/27/20 0957 01/27/20 1247 01/28/20 0422  WBC 10.0 17.4*  --  20.9* 32.3*  --   --  25.8*  CREATININE 0.51 0.79  --  1.31* 1.14*  --   --  0.89  LATICACIDVEN  --   --  1.0  --   --  2.3* 2.0*  --     Estimated Creatinine Clearance: 53.5 mL/min (by C-G formula based on SCr of 0.89 mg/dL).    Allergies  Allergen Reactions  . Hydrocodone     Other reaction(s): Unknown  . Naproxen     Other reaction(s): Unknown  . Other Other (See Comments)    nuts  . Sulfamethoxazole-Trimethoprim     Other reaction(s): Unknown  . Tramadol     Other reaction(s): Unknown Pt has tolerated this med 1/5-1/7 with no issues    Antimicrobials this admission: 9/1 CTX >> 9/3 9/1 Azitrhomycin >> 9/3 9/4 Levofloxacin >> 9/7 9/6 Cefazolin >> 9/7 9/7 Cefepime >> 9/7  Vancomycin >>  Dose adjustments this admission: N/A  Microbiology results: 9/6 BCx: NGTD UCx: sent 9/2 MRSA PCR: positive  Thank you for allowing pharmacy to be a part of this patient's care.  11/6, PharmD Pharmacy Resident  01/28/2020 7:19 AM

## 2020-01-28 NOTE — Progress Notes (Signed)
Physical Therapy Treatment Patient Details Name: Alice Wallace MRN: [id number] DOB: 03-08-1941 Today's Date: 01/28/2020    History of Present Illness Per MD notes: Pt is a 79 y.o. caucasian female with a known history of COPD, hypertension, deafness and muteness, presented to the emergency room with acute onset of fall at her assisted living facility with subsequent right hip pain and significant respiratory distress.  She was thought to be in COPD exacerbation was given IV Solu-Medrol and nebulized albuterol as well as duo nebs by EMS on route to the hospital.  Pt found to have a proximal right femoral diaphysis fracture and is now s/p R hip IM nail.  MD assessment also includes: Acute on chronic respiratory failure with COPD exacerbation /bronchitis, cannot rule out pneumonia, h/o CHF, suspected NSTEMI, HTN, hypokalemia, and depression.    PT Comments    Ready for session.  Participated in exercises as described below.  Extended session and RN and MD came in for care.  Assisted as needed.  She is able to get to EOB with min a x 1.  Once sitting it is noted she is inc urine saturating sheets.  Staff in to assist.  She is able to sit for several minutes unsupported.  She attempts standing with RW but is unable despite max a x 1.  Walker removed and frontal assist given and she is able to stand for 15 seconds before self initiating sitting while linens are changed but is inc again in standing and she is assisted back to supine for another linen change.  Positioned for comfort.  Needs met.  Does well with written communication on board at bedside.   Follow Up Recommendations  SNF     Equipment Recommendations       Recommendations for Other Services       Precautions / Restrictions Precautions Precautions: Fall Restrictions Weight Bearing Restrictions: Yes RLE Weight Bearing: Weight bearing as tolerated Other Position/Activity Restrictions: Per cardiology 01/27/20 OK for patient to  participate with PT services without restrictions    Mobility  Bed Mobility Overal bed mobility: Needs Assistance Bed Mobility: Rolling;Supine to Sit;Sit to Supine Rolling: Min assist   Supine to sit: Min assist Sit to supine: Min assist   General bed mobility comments: Min A for trunk and BLE control  Transfers   Equipment used: Rolling walker (2 wheeled);None Transfers: Sit to/from Stand Sit to Stand: Mod assist;Max assist         General transfer comment: attemtped standing with RW but unable.  Frontal assist given with belt and she is able to stand for about 15 seconds before initiating sitting  Ambulation/Gait             General Gait Details: Unable   Stairs             Wheelchair Mobility    Modified Rankin (Stroke Patients Only)       Balance Overall balance assessment: Needs assistance Sitting-balance support: Single extremity supported;Feet unsupported Sitting balance-Leahy Scale: Fair     Standing balance support: Bilateral upper extremity supported Standing balance-Leahy Scale: Poor                              Cognition Arousal/Alertness: Awake/alert Behavior During Therapy: WFL for tasks assessed/performed Overall Cognitive Status: Within Functional Limits for tasks assessed  Exercises General Exercises - Lower Extremity Ankle Circles/Pumps: AROM;Both;Supine Heel Slides: AAROM;Supine;Both Hip ABduction/ADduction: Supine;AAROM;Both Straight Leg Raises: AAROM;Supine;Both (due to pain)    General Comments        Pertinent Vitals/Pain Pain Assessment: Faces Faces Pain Scale: Hurts little more Pain Location: R hip Pain Descriptors / Indicators: Grimacing;Moaning Pain Intervention(s): Limited activity within patient's tolerance;Monitored during session;Premedicated before session;Repositioned    Home Living                      Prior Function             PT Goals (current goals can now be found in the care plan section) Progress towards PT goals: Progressing toward goals    Frequency    BID      PT Plan Current plan remains appropriate    Co-evaluation              AM-PAC PT "6 Clicks" Mobility   Outcome Measure  Help needed turning from your back to your side while in a flat bed without using bedrails?: A Little Help needed moving from lying on your back to sitting on the side of a flat bed without using bedrails?: A Little Help needed moving to and from a bed to a chair (including a wheelchair)?: Total Help needed standing up from a chair using your arms (e.g., wheelchair or bedside chair)?: A Lot Help needed to walk in hospital room?: Total Help needed climbing 3-5 steps with a railing? : Total 6 Click Score: 11    End of Session Equipment Utilized During Treatment: Gait belt;Oxygen Activity Tolerance: Patient limited by pain Patient left: in bed;with call bell/phone within reach;with bed alarm set;with nursing/sitter in room Nurse Communication: Mobility status;Other (comment) Pain - Right/Left: Right Pain - part of body: Hip     Time: 1610-9604 PT Time Calculation (min) (ACUTE ONLY): 38 min  Charges:  $Therapeutic Exercise: 8-22 mins $Therapeutic Activity: 23-37 mins                    Chesley Noon, PTA 01/28/20, 11:51 AM

## 2020-01-28 NOTE — Progress Notes (Signed)
Subjective:  Patient reports pain as mild.    Objective:   VITALS:   Vitals:   01/28/20 0955 01/28/20 1145 01/28/20 1456 01/28/20 1615  BP:  (!) 114/55  (!) 106/57  Pulse:  (!) 102  92  Resp:  20  19  Temp:  98.2 F (36.8 C)  97.8 F (36.6 C)  TempSrc:  Oral  Oral  SpO2: 92% 99% 95% 96%  Weight:      Height:        PHYSICAL EXAM:  Neurologically intact ABD soft Neurovascular intact Sensation intact distally Intact pulses distally Dorsiflexion/Plantar flexion intact Incision: dressing C/D/I No cellulitis present Compartment soft  LABS  Results for orders placed or performed during the hospital encounter of 01/21/20 (from the past 24 hour(s))  CBC with Differential/Platelet     Status: Abnormal   Collection Time: 01/28/20  4:22 AM  Result Value Ref Range   WBC 25.8 (H) 4.0 - 10.5 K/uL   RBC 2.81 (L) 3.87 - 5.11 MIL/uL   Hemoglobin 8.5 (L) 12.0 - 15.0 g/dL   HCT 78.4 (L) 36 - 46 %   MCV 92.2 80.0 - 100.0 fL   MCH 30.2 26.0 - 34.0 pg   MCHC 32.8 30.0 - 36.0 g/dL   RDW 69.6 29.5 - 28.4 %   Platelets 263 150 - 400 K/uL   nRBC 0.0 0.0 - 0.2 %   Neutrophils Relative % 93 %   Neutro Abs 23.7 (H) 1.7 - 7.7 K/uL   Lymphocytes Relative 3 %   Lymphs Abs 0.9 0.7 - 4.0 K/uL   Monocytes Relative 3 %   Monocytes Absolute 0.9 0 - 1 K/uL   Eosinophils Relative 0 %   Eosinophils Absolute 0.0 0 - 0 K/uL   Basophils Relative 0 %   Basophils Absolute 0.0 0 - 0 K/uL   WBC Morphology TOXIC GRANULATION    RBC Morphology MORPHOLOGY UNREMARKABLE    Smear Review Normal platelet morphology    Immature Granulocytes 1 %   Abs Immature Granulocytes 0.28 (H) 0.00 - 0.07 K/uL  Basic metabolic panel     Status: Abnormal   Collection Time: 01/28/20  4:22 AM  Result Value Ref Range   Sodium 134 (L) 135 - 145 mmol/L   Potassium 3.0 (L) 3.5 - 5.1 mmol/L   Chloride 94 (L) 98 - 111 mmol/L   CO2 28 22 - 32 mmol/L   Glucose, Bld 121 (H) 70 - 99 mg/dL   BUN 40 (H) 8 - 23 mg/dL    Creatinine, Ser 1.32 0.44 - 1.00 mg/dL   Calcium 7.8 (L) 8.9 - 10.3 mg/dL   GFR calc non Af Amer >60 >60 mL/min   GFR calc Af Amer >60 >60 mL/min   Anion gap 12 5 - 15  Lactic acid, plasma     Status: None   Collection Time: 01/28/20  9:09 AM  Result Value Ref Range   Lactic Acid, Venous 1.7 0.5 - 1.9 mmol/L    DG Chest 2 View  Result Date: 01/27/2020 CLINICAL DATA:  Shortness of breath. EXAM: CHEST - 2 VIEW COMPARISON:  January 26, 2020. FINDINGS: Stable cardiomegaly. Stable old bilateral rib fractures. Severe emphysematous disease is noted in both lungs, right greater than left. No definite pneumothorax is noted. No definite pleural effusion is noted. Stable bibasilar opacities are noted which may represent pneumonia or possibly edema. Small bilateral pleural effusions may be present. IMPRESSION: Stable bibasilar opacities are noted which may represent pneumonia or  possibly edema. Stable severe emphysematous disease is noted in both lungs, right greater than left. Small bilateral pleural effusions may be present. Aortic Atherosclerosis (ICD10-I70.0) and Emphysema (ICD10-J43.9). Electronically Signed   By: Lupita Raider M.D.   On: 01/27/2020 13:55   DG HIP OPERATIVE UNILAT W OR W/O PELVIS RIGHT  Result Date: 01/26/2020 CLINICAL DATA:  Portable operative images for right proximal femur ORIF. EXAM: OPERATIVE RIGHT HIP (WITH PELVIS IF PERFORMED) 5 VIEWS TECHNIQUE: Fluoroscopic spot image(s) were submitted for interpretation post-operatively. COMPARISON:  01/25/2020 FINDINGS: Five spot fluoro graphic operative images show placement of a long medullary rod through the femur supporting a compression screw. The proximal femur fracture components have been reduced, with the distal fracture component minimally displaced medial compared to the proximal component by 6-7 mm. Orthopedic hardware appears well seated and aligned. IMPRESSION: 1. Significant reduction of the proximal right femur fracture following  ORIF. Electronically Signed   By: Amie Portland M.D.   On: 01/26/2020 18:41    Assessment/Plan: 2 Days Post-Op   Principal Problem:   Acute respiratory failure with hypoxia and hypercapnia (HCC) Active Problems:   Hypokalemia   AKI (acute kidney injury) (HCC)   Nonspeaking deaf   COPD exacerbation (HCC)   Non-STEMI (non-ST elevated myocardial infarction) (HCC)   Hip fracture, unspecified laterality, closed, initial encounter (HCC)   Goals of care, counseling/discussion   Palliative care by specialist   Up with therapy, WBAT when medically stable OK to restart anticoagulation from ortho standpoint Discharge per medicine Follow up with [name] office, the week of September 13 for staple removal, Call office to confirm appt [phone] Dressing intact today   Altamese Cabal , MD 01/28/2020, 4:41 PM

## 2020-01-29 ENCOUNTER — Inpatient Hospital Stay: Payer: Medicare Other

## 2020-01-29 DIAGNOSIS — J9602 Acute respiratory failure with hypercapnia: Secondary | ICD-10-CM

## 2020-01-29 LAB — CBC WITH DIFFERENTIAL/PLATELET
Abs Immature Granulocytes: 0.2 10*3/uL — ABNORMAL HIGH (ref 0.00–0.07)
Basophils Absolute: 0 10*3/uL (ref 0.0–0.1)
Basophils Relative: 0 %
Eosinophils Absolute: 0.1 10*3/uL (ref 0.0–0.5)
Eosinophils Relative: 0 %
HCT: 26.3 % — ABNORMAL LOW (ref 36.0–46.0)
Hemoglobin: 8.8 g/dL — ABNORMAL LOW (ref 12.0–15.0)
Immature Granulocytes: 1 %
Lymphocytes Relative: 3 %
Lymphs Abs: 0.7 10*3/uL (ref 0.7–4.0)
MCH: 30.9 pg (ref 26.0–34.0)
MCHC: 33.5 g/dL (ref 30.0–36.0)
MCV: 92.3 fL (ref 80.0–100.0)
Monocytes Absolute: 0.9 10*3/uL (ref 0.1–1.0)
Monocytes Relative: 4 %
Neutro Abs: 23.2 10*3/uL — ABNORMAL HIGH (ref 1.7–7.7)
Neutrophils Relative %: 92 %
Platelets: 253 10*3/uL (ref 150–400)
RBC: 2.85 MIL/uL — ABNORMAL LOW (ref 3.87–5.11)
RDW: 15 % (ref 11.5–15.5)
Smear Review: NORMAL
WBC: 25.2 10*3/uL — ABNORMAL HIGH (ref 4.0–10.5)
nRBC: 0 % (ref 0.0–0.2)

## 2020-01-29 LAB — URINE CULTURE: Culture: 80000 — AB

## 2020-01-29 LAB — BASIC METABOLIC PANEL
Anion gap: 11 (ref 5–15)
BUN: 37 mg/dL — ABNORMAL HIGH (ref 8–23)
CO2: 31 mmol/L (ref 22–32)
Calcium: 8.2 mg/dL — ABNORMAL LOW (ref 8.9–10.3)
Chloride: 92 mmol/L — ABNORMAL LOW (ref 98–111)
Creatinine, Ser: 0.86 mg/dL (ref 0.44–1.00)
GFR calc Af Amer: >60 mL/min (ref >60)
GFR calc non Af Amer: >60 mL/min (ref >60)
Glucose, Bld: 126 mg/dL — ABNORMAL HIGH (ref 70–99)
Potassium: 3.8 mmol/L (ref 3.5–5.1)
Sodium: 134 mmol/L — ABNORMAL LOW (ref 135–145)

## 2020-01-29 LAB — PROCALCITONIN: Procalcitonin: 0.21 ng/mL

## 2020-01-29 LAB — BRAIN NATRIURETIC PEPTIDE: B Natriuretic Peptide: 2160.7 pg/mL — ABNORMAL HIGH (ref 0.0–100.0)

## 2020-01-29 LAB — MAGNESIUM: Magnesium: 2.8 mg/dL — ABNORMAL HIGH (ref 1.7–2.4)

## 2020-01-29 LAB — VANCOMYCIN, TROUGH: Vancomycin Tr: 22 ug/mL (ref 15–20)

## 2020-01-29 LAB — FIBRIN DERIVATIVES D-DIMER (ARMC ONLY): Fibrin derivatives D-dimer (ARMC): 2429.51 ng/mL (FEU) — ABNORMAL HIGH (ref 0.00–499.00)

## 2020-01-29 MED ORDER — FUROSEMIDE 10 MG/ML IJ SOLN
40.0000 mg | Freq: Two times a day (BID) | INTRAMUSCULAR | Status: DC
  Administered 2020-01-30: 40 mg via INTRAVENOUS
  Filled 2020-01-29: qty 4

## 2020-01-29 MED ORDER — VANCOMYCIN HCL 1250 MG/250ML IV SOLN
1250.0000 mg | INTRAVENOUS | Status: DC
  Administered 2020-01-30: 1250 mg via INTRAVENOUS
  Filled 2020-01-29 (×2): qty 250

## 2020-01-29 MED ORDER — FUROSEMIDE 10 MG/ML IJ SOLN
20.0000 mg | Freq: Once | INTRAMUSCULAR | Status: AC
  Administered 2020-01-29: 20 mg via INTRAVENOUS
  Filled 2020-01-29: qty 2

## 2020-01-29 NOTE — Progress Notes (Signed)
Occupational Therapy Treatment Patient Details Name: Alice Wallace MRN: [id number] DOB: [birth year] Today's Date: 01/29/2020    History of present illness Per MD notes: Pt is a 79 y.o. caucasian female with a known history of COPD, hypertension, deafness and muteness, presented to the emergency room with acute onset of fall at her assisted living facility with subsequent right hip pain and significant respiratory distress.  She was thought to be in COPD exacerbation was given IV Solu-Medrol and nebulized albuterol as well as duo nebs by EMS on route to the hospital.  Pt found to have a proximal right femoral diaphysis fracture and is now s/p R hip IM nail.  MD assessment also includes: Acute on chronic respiratory failure with COPD exacerbation /bronchitis, cannot rule out pneumonia, h/o CHF, suspected NSTEMI, HTN, hypokalemia, and depression.   OT comments  Upon entering the room, pt supine in bed.OT utilized dry erase board for communication with pt nodding head or providing thumbs up/down in response. Pt agreeable to B UE strengthening exercises from bed level secondary to fatigue. OT demonstrated use of level 1 resistive theraband with pt returning demonstrations with min cuing. Pt performed 3 sets of 10 chest pulls, shoulder diagonals, shoulder elevation, bicep curls, and alternating punches with rest breaks as needed. Pt found on Fi O2 of 50% and flow rate of 40. Pt with desaturation into mid 80's with therapeutic exercise but quickly returning into 90s with rest break. Pt requesting pain medication for chest pain secondary to coughing and RN notified. Pt continues to benefit from OT intervention.  Follow Up Recommendations  SNF;Supervision - Intermittent    Equipment Recommendations  Other (comment) (defer to next venue of care)       Precautions / Restrictions Precautions Precautions: Fall Restrictions RLE Weight Bearing: Weight bearing as tolerated Other Position/Activity Restrictions: Per  Dr. Denton Lank 01/29/20 OK for pt to participate fully with therapy to pt tolerance       Mobility Bed Mobility Overal bed mobility: Needs Assistance      General bed mobility comments: deferred secondary to fatigue  Transfers    General transfer comment: not attempted due to pt tolerance for activity        ADL either performed or assessed with clinical judgement        Vision Patient Visual Report: No change from baseline            Cognition Arousal/Alertness: Awake/alert Behavior During Therapy: WFL for tasks assessed/performed Overall Cognitive Status: Within Functional Limits for tasks assessed       General Comments: Use of dry erase board this session. Pt indicated she could read lips and clean mask available but she prefers written communication. She responds well with nods and thumb up/down.                   Pertinent Vitals/ Pain       Pain Assessment: Faces Pain Score: 2  Faces Pain Scale: Hurts a little bit Pain Location: chest pain Pain Descriptors / Indicators: Discomfort Pain Intervention(s): Monitored during session;Patient requesting pain meds-RN notified         Frequency  Min 1X/week        Progress Toward Goals  OT Goals(current goals can now be found in the care plan section)  Progress towards OT goals: Progressing toward goals  Acute Rehab OT Goals Patient Stated Goal: non verbal Time For Goal Achievement: 02/06/20 Potential to Achieve Goals: Good  Plan Discharge plan remains appropriate  AM-PAC OT "6 Clicks" Daily Activity     Outcome Measure   Help from another person eating meals?: None Help from another person taking care of personal grooming?: A Little Help from another person toileting, which includes using toliet, bedpan, or urinal?: A Lot Help from another person bathing (including washing, rinsing, drying)?: A Lot Help from another person to put on and taking off regular upper body clothing?: A  Little Help from another person to put on and taking off regular lower body clothing?: A Lot 6 Click Score: 16    End of Session    OT Visit Diagnosis: Unsteadiness on feet (R26.81);Muscle weakness (generalized) (M62.81);History of falling (Z91.81)   Activity Tolerance Patient tolerated treatment well   Patient Left in bed;with call bell/phone within reach;with bed alarm set   Nurse Communication Precautions (oxygen sats)        Time: 2248-2500 OT Time Calculation (min): 39 min  Charges: OT General Charges $OT Visit: 1 Visit OT Treatments $Therapeutic Exercise: 38-52 mins  Jackquline Denmark, MS, OTR/L , CBIS ascom [phone]  01/29/20, 4:30 PM

## 2020-01-29 NOTE — Care Management Important Message (Signed)
Important Message  Patient Details  Name: Alice Wallace MRN: [id number] Date of Birth: 05/12/1941   Medicare Important Message Given:  Yes     [name] 01/29/2020, 2:52 PM

## 2020-01-29 NOTE — Progress Notes (Signed)
Physical Therapy Treatment Patient Details Name: Alice Wallace MRN: [id number] DOB: Jul 28, 1940 Today's Date: 01/29/2020    History of Present Illness Per MD notes: Pt is a 79 y.o. caucasian female with a known history of COPD, hypertension, deafness and muteness, presented to the emergency room with acute onset of fall at her assisted living facility with subsequent right hip pain and significant respiratory distress.  She was thought to be in COPD exacerbation was given IV Solu-Medrol and nebulized albuterol as well as duo nebs by EMS on route to the hospital.  Pt found to have a proximal right femoral diaphysis fracture and is now s/p R hip IM nail.  MD assessment also includes: Acute on chronic respiratory failure with COPD exacerbation /bronchitis, cannot rule out pneumonia, h/o CHF, suspected NSTEMI, HTN, hypokalemia, and depression.    PT Comments    Pt was pleasant and motivated to participate during the session but ultimately was quite limited by pain/functional weakness.  Pt required less physical assistance overall with bed mobility and transfers but remained unable to lift her left foot from the floor/ambulate.  Pt found on FiO2 of 70% with SpO2 remaining in the low to mid 90s during the majority of the session other than after standing activities where it dropped to 88% but quickly returned to 90s upon sitting.  Pt will benefit from PT services in a SNF setting upon discharge to safely address deficits listed in patient problem list for decreased caregiver assistance and eventual return to PLOF.    Follow Up Recommendations  SNF     Equipment Recommendations  Other (comment) (TBD at next venue of care)    Recommendations for Other Services       Precautions / Restrictions Precautions Precautions: Fall Restrictions Weight Bearing Restrictions: Yes RLE Weight Bearing: Weight bearing as tolerated Other Position/Activity Restrictions: Per Dr. Denton Lank 01/29/20 OK for pt to  participate fully with PT to pt tolerance    Mobility  Bed Mobility Overal bed mobility: Needs Assistance Bed Mobility: Supine to Sit;Sit to Supine     Supine to sit: Min assist Sit to supine: Min assist   General bed mobility comments: Min A for RLE control  Transfers Overall transfer level: Needs assistance Equipment used: Rolling walker (2 wheeled) Transfers: Sit to/from Stand Sit to Stand: Min assist;From elevated surface         General transfer comment: Improved eccentric and concentric control but continued to require min A to stand from an elevated surface  Ambulation/Gait             General Gait Details: Unable; pt able to slightly lift her R foot from the floor but unable to lift the L foot or to ambulate   Stairs             Wheelchair Mobility    Modified Rankin (Stroke Patients Only)       Balance Overall balance assessment: Needs assistance Sitting-balance support: Single extremity supported;Feet unsupported Sitting balance-Leahy Scale: Fair     Standing balance support: Bilateral upper extremity supported Standing balance-Leahy Scale: Poor Standing balance comment: Heavy lean on the RW for support                            Cognition Arousal/Alertness: Awake/alert Behavior During Therapy: WFL for tasks assessed/performed Overall Cognitive Status: Within Functional Limits for tasks assessed  Exercises Total Joint Exercises Hip ABduction/ADduction: AAROM;Both;5 reps Straight Leg Raises: AAROM;Both;5 reps Long Arc Quad: AROM;Strengthening;Both;10 reps Marching in Standing: AROM;Right;5 reps;Standing Other Exercises Other Exercises: Static standing at EOB with cues for increased WB through the RLE    General Comments        Pertinent Vitals/Pain Pain Assessment: 0-10 Pain Score: 2  Pain Location: R hip Pain Descriptors / Indicators: Grimacing;Moaning Pain  Intervention(s): Premedicated before session;Monitored during session    Home Living                      Prior Function            PT Goals (current goals can now be found in the care plan section) Progress towards PT goals: Progressing toward goals    Frequency    BID      PT Plan Current plan remains appropriate    Co-evaluation              AM-PAC PT "6 Clicks" Mobility   Outcome Measure  Help needed turning from your back to your side while in a flat bed without using bedrails?: A Little Help needed moving from lying on your back to sitting on the side of a flat bed without using bedrails?: A Little Help needed moving to and from a bed to a chair (including a wheelchair)?: A Lot Help needed standing up from a chair using your arms (e.g., wheelchair or bedside chair)?: A Little Help needed to walk in hospital room?: Total Help needed climbing 3-5 steps with a railing? : Total 6 Click Score: 13    End of Session Equipment Utilized During Treatment: Gait belt;Oxygen Activity Tolerance: Patient limited by pain Patient left: in bed;with call bell/phone within reach;with bed alarm set Nurse Communication: Mobility status PT Visit Diagnosis: Unsteadiness on feet (R26.81);History of falling (Z91.81);Difficulty in walking, not elsewhere classified (R26.2);Muscle weakness (generalized) (M62.81);Pain Pain - Right/Left: Right Pain - part of body: Hip     Time: 1761-6073 PT Time Calculation (min) (ACUTE ONLY): 34 min  Charges:  $Therapeutic Exercise: 8-22 mins $Therapeutic Activity: 8-22 mins                     D. Scott Efren Kross PT, DPT 01/29/20, 11:19 AM

## 2020-01-29 NOTE — Progress Notes (Signed)
Palliative:  Ms. Lippold is sitting up in the bed in her room.  She makes and mostly keeps eye contact.  She is deaf/mute, but able to make her basic needs known.  She declines food or drink at this time.  She has a strong cough, and I encouraged her to continue to cough as much as possible.  Conference with attending, transition of care team, bedside nursing staff and respiratory therapy related to patient condition, needs, oxygen requirements, mobility.  Plan: Continue to treat the treatable but no extraordinary measures such as CPR or intubation.  Wean oxygen as tolerated.  Continue with inpatient rehab.  Goal is short-term rehab with return to ALF.  25 minutes  Lillia Carmel, NP Palliative Medicine Team  Team Phone [phone] Greater than 50% of this time was spent counseling and coordinating care related to the above and

## 2020-01-29 NOTE — Progress Notes (Addendum)
PROGRESS NOTE    Patrina LeveringMadlyn Warmuth   ZOX:[id number]  DOB: 02/12/1941  PCP: Devoria GlassingLambert, Tracey, NP    DOA: 01/21/2020 LOS: 8   Brief Narrative   [facility] assumed care on 01/28/2020, patient out of ICU.  HPI and Hospital Course summarized below:  79 y.o. Caucasian female with a known history of COPD, hypertension, deafness and muteness, (can read lips and written communications) presented to the ED on 01/21/2020 after having a fall at her assisted living facility with subsequent right hip pain and significant respiratory distress and hypoxia with O2 sat in 70's on her usual 2 L/min oxygen.   Treated with Duoneb, albuterol neb and IV Solu-medrol by EMS for presumed COPD exacerbation.    In the ED, tachycardic HR 106, tachypneic RR 23, O2 sat 76% on room air, improved to 93-96% on 6 L/min.  CTA was negative for PE, but showed RLL atelectasis vs pneumonia and advanced emphysema.  Imaging consistent with R proximal femur fracture.   She is status post right hip ORIF on 01/26/2020 with Dr. Odis LusterBowers.  She developed significant respiratory distress post-operatively, and has ongoing requirement for heated HFNC at rates of 40-50 L/min.  Pulmonology consulted.          Assessment & Plan   Principal Problem:   Acute respiratory failure with hypoxia and hypercapnia (HCC) Active Problems:   AKI (acute kidney injury) (HCC)   COPD exacerbation (HCC)   Non-STEMI (non-ST elevated myocardial infarction) (HCC)   Hypokalemia   Hip fracture, unspecified laterality, closed, initial encounter (HCC)   Goals of care, counseling/discussion   Palliative care by specialist   Nonspeaking deaf   Acute respiratory failure with hypoxia and hypercapnia -present on admission, improved, recurred post-operatively.  Due to acute heart failure in the setting of non-STEMI with acute pulmonary edema and renal failure, underlying COPD/emphysema.   --Continue supplemental oxygen as needed, maintain O2 sat greater than 88%, wean as  tolerated --Patient reportedly does not tolerate BiPAP, continue HFNC --Consulted pulmonology given persistent high O2 needs, appreciate their assistance  Community-acquired pneumonia - present on admission, RLL, suspect aspiration.  Initially respiratory failure attributed to cardiac issues so antibiotics were held.  Patient developed significant leukocytosis, and repeat chest xray on 9/7 showed stable bibasilar opacities.  Started on Vanc/Cefepime on 9/7, had initially received couple days Zithromax on admission.    --Continue Vanc /Cefepime for now  COPD with acute exacerbation - present on admission with hypercapnic respiratory failure as above.   Was treated with IV steroids, course completed. --Continue DuoNebs 3 times daily --Mucinex  Non-STEMI -present on admission, troponin peaked over 4k and downtrended.  Treating medically, heparin drip completed on 9/3. Acute diastolic CHF -likely secondary to non-STEMI.  Echo on 9/1 showed EF 60 to 65%, grade 1 diastolic dysfunction, no wall motion abnormalities. 9/9: BNP significantly higher than previous, with continued high oxygen requirement.   Net IO Since Admission: -4,790.09 mL [01/29/20 1720] --Cardiology consulted, signed off 9/7 --Follow-up with Dr. Lady GaryFath 1 week after discharge --Continue aspirin, Plavix, high intensity statin, Imdur --Cardizem CD was reduced to 180 mg --Metoprolol succinate 25 mg daily was added --Consider addition of ACE inhibitor at discharge if renal function and blood pressure tolerate --Increase IV Lasix from 20 to 40 mg BID with close monitoring of renal function and electrolytes --Give extra 20 mg IV Lasix this afternoon --Continue midodrine for BP support --maintain MAP greater than 65  Elevated D-dimer - had CTA chest on 9/1 which ruled out PE  on admission, is getting Lovenox for VTE prophylaxis.   --Will check lower extremity doppler U/S.   --Consider repeating CTA chest vs V/Q scan.    Acute Kidney  Injury - POA, resolved.  Monitor BMP daily.    Right hip fracture -present on admission, sustained during fall at ALF.  Underwent ORIF 01/26/2020 with [name]. --Work with PT --Weightbearing as tolerated once medically stable --Per Ortho, okay to resume anticoagulation  Generalized weakness - PT and OT are following, recommending SNF for rehab upon discharge.  GERD -continue oral Protonix daily  Depression/anxiety -continue Lexapro  Osteoporosis -takes Fosamax weekly.  Continue calcium and vitamin D supplements  History of iron deficiency -continue oral iron supplement BID with meals  Allergic rhinitis -continue loratadine   Patient BMI: Body mass index is 27.12 kg/m.   DVT prophylaxis: enoxaparin (LOVENOX) injection 40 mg Start: 01/27/20 2200 SCDs Start: 01/26/20 2057   Diet:  Diet Orders (From admission, onward)    Start     Ordered   01/26/20 2057  Diet regular Room service appropriate? Yes; Fluid consistency: Thin  Diet effective now       Question Answer Comment  Room service appropriate? Yes   Fluid consistency: Thin      01/26/20 2056            Code Status: DNR    Subjective 01/29/20    Patient seen at bedside.  No acute events reported.  Patient reports being tired.  Has ongoing shortness of breath and cough, some chest discomfort due to coughing.  Denies chest pain.  No other acute complaints.   Disposition Plan & Communication   Status is: Inpatient  Remains inpatient appropriate because:IV treatments appropriate due to intensity of illness or inability to take PO   Dispo: The patient is from: ALF              Anticipated d/c is to: SNF              Anticipated d/c date is: > 3 days              Patient currently is not medically stable to d/c.    Family Communication: none at bedside, will attempt to call. Patient's legal guardian is Ms. Theodosia Blender [phone]   Consults, Procedures, Significant Events   Consultants:    Cardiology  Palliative care  Orthopedics  Procedures:   R hip ORIF on 01/26/20  Antimicrobials:   Vancomycin 9/7 >> 9/9  Cefepime 9/7 >>  Azithromycin 9/1 >> 9/3   Objective   Vitals:   01/29/20 1026 01/29/20 1110 01/29/20 1437 01/29/20 1507  BP: (!) 105/54 (!) 107/51  107/64  Pulse: 86 61  (!) 101  Resp:  16  16  Temp:  98.4 F (36.9 C)  97.8 F (36.6 C)  TempSrc:  Oral  Oral  SpO2:  95% 93% 90%  Weight:      Height:        Intake/Output Summary (Last 24 hours) at 01/29/2020 1720 Last data filed at 01/29/2020 1100 Gross per 24 hour  Intake 1090 ml  Output 1900 ml  Net -810 ml   Filed Weights   01/24/20 0538 01/25/20 0500 01/28/20 0313  Weight: 68.5 kg 73.9 kg 76.2 kg    Physical Exam:  General exam: awake, alert, no acute distress, nonverbal Respiratory system: diminished bases, shallow inspirations, on heated HFNC, mildly increased respiratory effort. Cardiovascular system: normal S1/S2, RRR, no pedal edema.   Gastrointestinal system:  soft, NT, ND, +bowel sounds. Extremities: moves all, no cyanosis, normal tone Skin: dry, intact, normal temperature, normal color Psychiatry: normal mood, congruent affect  Labs   Data Reviewed: I have personally reviewed following labs and imaging studies  CBC: Recent Labs  Lab 01/25/20 0401 01/26/20 0425 01/27/20 0503 01/28/20 0422 01/29/20 0622  WBC 17.4* 20.9* 32.3* 25.8* 25.2*  NEUTROABS  --   --   --  23.7* 23.2*  HGB 10.0* 11.0* 9.7* 8.5* 8.8*  HCT 29.2* 33.9* 28.3* 25.9* 26.3*  MCV 88.0 91.6 91.0 92.2 92.3  PLT 407* 465* 361 263 253   Basic Metabolic Panel: Recent Labs  Lab 01/25/20 0401 01/26/20 0425 01/27/20 0503 01/28/20 0422 01/29/20 0622  NA 134* 136 133* 134* 134*  K 3.6 4.0 4.1 3.0* 3.8  CL 99 91* 93* 94* 92*  CO2 27 31 30 28 31   GLUCOSE 118* 137* 146* 121* 126*  BUN 15 26* 43* 40* 37*  CREATININE 0.79 1.31* 1.14* 0.89 0.86  CALCIUM 8.7* 9.0 8.1* 7.8* 8.2*  MG  --   --   --   --   2.8*   GFR: Estimated Creatinine Clearance: 55.4 mL/min (by C-G formula based on SCr of 0.86 mg/dL). Liver Function Tests: No results for input(s): AST, ALT, ALKPHOS, BILITOT, PROT, ALBUMIN in the last 168 hours. No results for input(s): LIPASE, AMYLASE in the last 168 hours. No results for input(s): AMMONIA in the last 168 hours. Coagulation Profile: No results for input(s): INR, PROTIME in the last 168 hours. Cardiac Enzymes: No results for input(s): CKTOTAL, CKMB, CKMBINDEX, TROPONINI in the last 168 hours. BNP (last 3 results) No results for input(s): PROBNP in the last 8760 hours. HbA1C: No results for input(s): HGBA1C in the last 72 hours. CBG: No results for input(s): GLUCAP in the last 168 hours. Lipid Profile: No results for input(s): CHOL, HDL, LDLCALC, TRIG, CHOLHDL, LDLDIRECT in the last 72 hours. Thyroid Function Tests: No results for input(s): TSH, T4TOTAL, FREET4, T3FREE, THYROIDAB in the last 72 hours. Anemia Panel: No results for input(s): VITAMINB12, FOLATE, FERRITIN, TIBC, IRON, RETICCTPCT in the last 72 hours. Sepsis Labs: Recent Labs  Lab 01/25/20 0858 01/27/20 0957 01/27/20 1247 01/28/20 0909 01/29/20 1029  PROCALCITON <0.10 0.29  --   --  0.21  LATICACIDVEN 1.0 2.3* 2.0* 1.7  --     Recent Results (from the past 240 hour(s))  SARS Coronavirus 2 by RT PCR (hospital order, performed in [facility] hospital lab) Nasopharyngeal Nasopharyngeal Swab     Status: None   Collection Time: 01/21/20  2:53 AM   Specimen: Nasopharyngeal Swab  Result Value Ref Range Status   SARS Coronavirus 2 NEGATIVE NEGATIVE Final    Comment: (NOTE) SARS-CoV-2 target nucleic acids are NOT DETECTED.  The SARS-CoV-2 RNA is generally detectable in upper and lower respiratory specimens during the acute phase of infection. The lowest concentration of SARS-CoV-2 viral copies this assay can detect is 250 copies / mL. A negative result does not preclude SARS-CoV-2 infection and  should not be used as the sole basis for treatment or other patient management decisions.  A negative result may occur with improper specimen collection / handling, submission of specimen other than nasopharyngeal swab, presence of viral mutation(s) within the areas targeted by this assay, and inadequate number of viral copies (<250 copies / mL). A negative result must be combined with clinical observations, patient history, and epidemiological information.  Fact Sheet for Patients:   03/22/20  Fact Sheet for  Healthcare Providers: https://pope.com/  This test is not yet approved or  cleared by the [place] and has been authorized for detection and/or diagnosis of SARS-CoV-2 by FDA under an Emergency Use Authorization (EUA).  This EUA will remain in effect (meaning this test can be used) for the duration of the COVID-19 declaration under Section 564(b)(1) of the Act, 21 U.S.C. section 360bbb-3(b)(1), unless the authorization is terminated or revoked sooner.  Performed at [facility], [address] [place], [place] [address]   MRSA PCR Screening     Status: Abnormal   Collection Time: 01/22/20  5:15 PM   Specimen: Nasopharyngeal  Result Value Ref Range Status   MRSA by PCR POSITIVE (A) NEGATIVE Final    Comment:        The GeneXpert MRSA Assay (FDA approved for NASAL specimens only), is one component of a comprehensive MRSA colonization surveillance program. It is not intended to diagnose MRSA infection nor to guide or monitor treatment for MRSA infections. RESULT CALLED TO, READ BACK BY AND VERIFIED WITH: BEBRA EMBDEN 01/22/20 AT 2211 BY ACR Performed at [facility], [address] [place], [place] [address]   CULTURE, BLOOD (ROUTINE X 2) w Reflex to ID Panel     Status: None (Preliminary result)   Collection Time: 01/26/20  9:17 AM   Specimen: BLOOD  Result Value Ref Range Status    Specimen Description BLOOD LEFT ANTECUBITAL  Final   Special Requests   Final    BOTTLES DRAWN AEROBIC AND ANAEROBIC Blood Culture adequate volume   Culture   Final    NO GROWTH 3 DAYS Performed at [facility], [address] [place], [place] [address]    Report Status PENDING  Incomplete  CULTURE, BLOOD (ROUTINE X 2) w Reflex to ID Panel     Status: None (Preliminary result)   Collection Time: 01/26/20  9:22 AM   Specimen: BLOOD  Result Value Ref Range Status   Specimen Description BLOOD RIGHT ANTECUBITAL  Final   Special Requests   Final    BOTTLES DRAWN AEROBIC AND ANAEROBIC Blood Culture adequate volume   Culture   Final    NO GROWTH 3 DAYS Performed at [facility], [address], [place], [place] [address]    Report Status PENDING  Incomplete  Urine Culture     Status: Abnormal   Collection Time: 01/27/20  1:14 PM   Specimen: Urine, Random  Result Value Ref Range Status   Specimen Description   Final    URINE, RANDOM Performed at [facility], [address], [place], [place] [address]    Special Requests   Final    NONE Performed at [facility], [address] [place], [place] [address]    Culture 80,000 COLONIES/mL ENTEROCOCCUS RAFFINOSUS (A)  Final   Report Status 01/29/2020 FINAL  Final   Organism ID, Bacteria ENTEROCOCCUS RAFFINOSUS (A)  Final      Susceptibility   Enterococcus raffinosus - MIC*    AMPICILLIN 16 RESISTANT Resistant     NITROFURANTOIN 32 SENSITIVE Sensitive     VANCOMYCIN <=0.5 SENSITIVE Sensitive     * 80,000 COLONIES/mL ENTEROCOCCUS RAFFINOSUS      Imaging Studies   DG Chest Port 1 View  Result Date: 01/29/2020 CLINICAL DATA:  79 year old female with history of hypoxia today. EXAM: PORTABLE CHEST 1 VIEW COMPARISON:  Chest x-ray 01/27/2020. FINDINGS: Severe emphysematous changes with extensive bullous disease in the right upper lobe. Widespread  areas of interstitial prominence an ill-defined  airspace disease noted throughout the mid to lower lungs bilaterally. No definite pleural effusions. No pneumothorax. No definite evidence of pulmonary edema. Mild cardiomegaly. Upper mediastinal contours are distorted by patient positioning. Numerous old healed bilateral rib fractures. IMPRESSION: 1. The appearance the chest is very similar to the prior study, again favored to reflect multilobar pneumonia. 2. Cardiomegaly. 3. Aortic atherosclerosis. 4. Emphysema. Electronically Signed   By: Trudie Reed M.D.   On: 01/29/2020 09:57     Medications   Scheduled Meds: . acidophilus  1 capsule Oral TID  . alum & mag hydroxide-simeth  30 mL Oral TID AC & HS  . aspirin EC  81 mg Oral Daily  . atorvastatin  80 mg Oral Daily  . calcium-vitamin D  1 tablet Oral BID  . cholecalciferol  5,000 Units Oral Daily  . clopidogrel  75 mg Oral Daily  . diltiazem  180 mg Oral Daily  . docusate sodium  100 mg Oral BID  . enoxaparin (LOVENOX) injection  40 mg Subcutaneous Q24H  . escitalopram  10 mg Oral Daily  . feeding supplement (ENSURE ENLIVE)  237 mL Oral TID BM  . ferrous sulfate  325 mg Oral BID WC  . furosemide  20 mg Intravenous Once  . [START ON 01/30/2020] furosemide  40 mg Intravenous Q12H  . guaiFENesin  600 mg Oral Daily  . ipratropium-albuterol  3 mL Nebulization TID  . isosorbide mononitrate  30 mg Oral Daily  . loratadine  10 mg Oral Daily  . metoprolol succinate  25 mg Oral Daily  . midodrine  5 mg Oral TID WC  . pantoprazole  40 mg Oral Daily  . potassium chloride SA  40 mEq Oral Daily   Continuous Infusions: . ceFEPime (MAXIPIME) IV 2 g (01/29/20 0923)  . lactated ringers Stopped (01/27/20 1653)  . [START ON 01/30/2020] vancomycin         LOS: 8 days    Time spent: 30 minutes    Pennie Banter, DO [facility]  01/29/2020, 5:20 PM    If 7PM-7AM, please contact night-coverage. How to contact the [facility] Attending or Consulting provider 7A - 7P or covering  provider during after hours 7P -7A, for this patient?    1. Check the care team in [facility] and look for a) attending/consulting [facility] provider listed and b) the [facility] team listed 2. Log into [email or web address] and use [facility] universal password to access. If you do not have the password, please contact the hospital operator. 3. Locate the [facility] provider you are looking for under [facility] and page to a number that you can be directly reached. 4. If you still have difficulty reaching the provider, please page the [facility] (Director on Call) for the Hospitalists listed on amion for assistance.

## 2020-01-29 NOTE — Progress Notes (Signed)
Physical Therapy Treatment Patient Details Name: Alice Wallace MRN: [id number] DOB: 1940/07/06 Today's Date: 01/29/2020    History of Present Illness Per MD notes: Pt is a 79 y.o. caucasian female with a known history of COPD, hypertension, deafness and muteness, presented to the emergency room with acute onset of fall at her assisted living facility with subsequent right hip pain and significant respiratory distress.  She was thought to be in COPD exacerbation was given IV Solu-Medrol and nebulized albuterol as well as duo nebs by EMS on route to the hospital.  Pt found to have a proximal right femoral diaphysis fracture and is now s/p R hip IM nail.  MD assessment also includes: Acute on chronic respiratory failure with COPD exacerbation /bronchitis, cannot rule out pneumonia, h/o CHF, suspected NSTEMI, HTN, hypokalemia, and depression.    PT Comments     Pt was pleasant and participated during the session. Pt had increased pain and fatigue upon arrival and indicated that she did not feel well enough to get out of bed so all exercise was done in supine. Pt displayed decrease tolerance to movement and had abnormal amounts of pain activating muscles in the R hip. Pt will benefit from PT services in a SNF setting upon discharge to safely address deficits listed in patient problem list for decreased caregiver assistance and eventual return to PLOF.   Follow Up Recommendations  SNF     Equipment Recommendations  Other (comment) (TBD at next venue of care)    Recommendations for Other Services       Precautions / Restrictions Precautions Precautions: Fall Restrictions Weight Bearing Restrictions: Yes RLE Weight Bearing: Weight bearing as tolerated Other Position/Activity Restrictions: Per Dr. Denton Lank 01/29/20 OK for pt to participate fully with PT to pt tolerance    Mobility  Bed Mobility Overal bed mobility: Needs Assistance   General bed mobility comments: Min A for RLE  control  Transfers General transfer comment: not attempted due to pt tolerance for activity  Ambulation/Gait   General Gait Details: not attempted due to pt tolerance for activity   Stairs             Wheelchair Mobility    Modified Rankin (Stroke Patients Only)       Balance Overall balance assessment: Needs assistance Sitting-balance support: Single extremity supported;Feet unsupported Sitting balance-Leahy Scale: Fair     Standing balance support: Bilateral upper extremity supported Standing balance-Leahy Scale: Poor Standing balance comment: Heavy lean on the RW for support                            Cognition Arousal/Alertness: Awake/alert Behavior During Therapy: WFL for tasks assessed/performed Overall Cognitive Status: Within Functional Limits for tasks assessed                                 General Comments: Pt is able to respond appropriately with thumbs up/down and head nodding. Used writing on paper for more complexed questioning. pt is unable to write but seems alert and oriented.      Exercises Total Joint Exercises Ankle Circles/Pumps: PROM;Left;Right;10 reps Quad Sets: AROM;Left;10 reps (AROM R 5 but painful) Gluteal Sets: AROM;Both;10 reps Towel Squeeze: AROM;Both;10 reps Short Arc Quad: AROM;Left;10 reps (AAROM R 10) Heel Slides: AROM;Left;10 reps (AAROM R 5 reps) Hip ABduction/ADduction: AAROM;5 reps;Both Straight Leg Raises: AAROM;Both;5 reps Long Arc Quad: AROM;Strengthening;Both;10 Ecologist in  Standing: AROM;Right;5 reps;Standing Other Exercises Other Exercises: Static standing at EOB with cues for increased WB through the RLE    General Comments        Pertinent Vitals/Pain Pain Assessment: 0-10 Pain Score: 2  Pain Location: R hip, some chest pain noted at the end of the session. pt mptes R hip pain at 6 during activity Pain Descriptors / Indicators: Grimacing;Moaning Pain Intervention(s):  Limited activity within patient's tolerance;Monitored during session    Home Living                      Prior Function            PT Goals (current goals can now be found in the care plan section) Acute Rehab PT Goals Patient Stated Goal: non verbal PT Goal Formulation: Patient unable to participate in goal setting Time For Goal Achievement: 02/09/20 Potential to Achieve Goals: Good Progress towards PT goals: Progressing toward goals    Frequency    BID      PT Plan Current plan remains appropriate    Co-evaluation              AM-PAC PT "6 Clicks" Mobility   Outcome Measure  Help needed turning from your back to your side while in a flat bed without using bedrails?: A Little Help needed moving from lying on your back to sitting on the side of a flat bed without using bedrails?: A Little Help needed moving to and from a bed to a chair (including a wheelchair)?: A Lot Help needed standing up from a chair using your arms (e.g., wheelchair or bedside chair)?: A Little Help needed to walk in hospital room?: Total Help needed climbing 3-5 steps with a railing? : Total 6 Click Score: 13    End of Session Equipment Utilized During Treatment: Oxygen Activity Tolerance: Patient limited by pain Patient left: in bed;with call bell/phone within reach;with bed alarm set Nurse Communication: Mobility status, nurse informed of proper positioning for heels while in bed PT Visit Diagnosis: Unsteadiness on feet (R26.81);History of falling (Z91.81);Difficulty in walking, not elsewhere classified (R26.2);Muscle weakness (generalized) (M62.81);Pain Pain - Right/Left: Right Pain - part of body: Hip (Chest)     Time: 2440-1027 PT Time Calculation (min) (ACUTE ONLY): 28 min  Charges:  $Therapeutic Exercise: 8-22 mins $Therapeutic Activity: 8-22 mins                    Nicolette Bang, SPT 01/29/20. 2:28 PM

## 2020-01-29 NOTE — Progress Notes (Signed)
Pharmacy Antibiotic Note  Alice Wallace is a 79 y.o. female admitted on 01/21/2020 with sepsis. Patient was initially on ceftriaxone and azithromycin then was switched to empiric cefazolin and PO levofloxacin (d/c'd 9/7). Pharmacy has been consulted for Vancomycin and Cefepime dosing. Initial LD of vancomycin given ~3 hours late.   9/1 DG chest: Density at the right lateral lung base may reflect focal lung consolidation and/or small effusion. 9/4 DG chest: Mild interval improvement. Decreased opacity at the right lung base consistent with improved pneumonia or atelectasis Day 9 total abx; on Day 3 cefepime and Vanc, WBC trending down after broadening abx, only 25.8>25.2 from yesterday (9/8), afebrile, Scr 1.31>1.14>0.89>0.86. 9/9 vanc level is pending. Ucx Enterococcus raffinosus (Amp R). 9/9 vanc trough back at 22 mcg/mL (goal 15-20)  Plan: -Decrease vancomycin dose to 1250 mg IV q24h -Continue Cefepime 2g q12h -Monitor renal function and plan to order vanc level prior re-check vanc trough prior to 5th dose of 1250 mg IV q24h  -Goal trough 15-48mcg/mL  Height: 5\' 6"  (167.6 cm) Weight: 76.2 kg (168 lb) IBW/kg (Calculated) : 59.3  Temp (24hrs), Avg:97.9 F (36.6 C), Min:97.4 F (36.3 C), Max:98.4 F (36.9 C)  Recent Labs  Lab 01/25/20 0401 01/25/20 0858 01/26/20 0425 01/27/20 0503 01/27/20 0957 01/27/20 1247 01/28/20 0422 01/28/20 0909 01/29/20 0622 01/29/20 1029  WBC 17.4*  --  20.9* 32.3*  --   --  25.8*  --  25.2*  --   CREATININE 0.79  --  1.31* 1.14*  --   --  0.89  --  0.86  --   LATICACIDVEN  --  1.0  --   --  2.3* 2.0*  --  1.7  --   --   VANCOTROUGH  --   --   --   --   --   --   --   --   --  22*    Estimated Creatinine Clearance: 55.4 mL/min (by C-G formula based on SCr of 0.86 mg/dL).    Allergies  Allergen Reactions  . Hydrocodone     Other reaction(s): Unknown  . Naproxen     Other reaction(s): Unknown  . Other Other (See Comments)    nuts  .  Sulfamethoxazole-Trimethoprim     Other reaction(s): Unknown  . Tramadol     Other reaction(s): Unknown Pt has tolerated this med 1/5-1/7 with no issues    Antimicrobials this admission: 9/1 CTX >> 9/3 9/1 Azithromycin >> 9/3 9/4 Levofloxacin >> 9/7 9/6 Cefazolin >> 9/7 9/7 Cefepime >> 9/7 Vancomycin >>  Microbiology results: 9/6 BCx: NGTD UCx: Enterococcus raffinosus  9/2 MRSA PCR: positive  Thank you for allowing pharmacy to be a part of this patient's care.  11/2, PharmD Pharmacy Resident  01/29/2020 5:18 PM

## 2020-01-29 NOTE — Progress Notes (Signed)
Pharmacy Antibiotic Note  Alice Wallace is a 79 y.o. female admitted on 01/21/2020 with sepsis. Patient was initially on ceftriaxone and azithromycin then was switched to empiric cefazolin and PO levofloxacin (d/c'd 9/7). Pharmacy has been consulted for Vancomycin and Cefepime dosing. Initial LD of vancomycin given ~3 hours late.   9/1 DG chest: Density at the right lateral lung base may reflect focal lung consolidation and/or small effusion. 9/4 DG chest: Mild interval improvement. Decreased opacity at the right lung base consistent with improved pneumonia or atelectasis Day 9 total abx; on Day 3 cefepime and Vanc, WBC trending down after broadening abx, only 25.8>25.2 from yesterday (9/8), afebrile, Scr 1.31>1.14>0.89>0.86. 9/9 vanc level is pending. Ucx Enterococcus raffinosus (Amp R).  Plan: -Continue vancomycin IV 750mg  q12h per dosing nomogram and adjust if needed when vanc level returns.  -Continue Cefepime 2g q12h -Monitor renal function and order vanc level prior to 5th dose (prior to AM dose on 9/9) -Goal trough 15-27mcg/mL  Height: 5\' 6"  (167.6 cm) Weight: 76.2 kg (168 lb) IBW/kg (Calculated) : 59.3  Temp (24hrs), Avg:97.9 F (36.6 C), Min:97.4 F (36.3 C), Max:98.4 F (36.9 C)  Recent Labs  Lab 01/25/20 0401 01/25/20 0858 01/26/20 0425 01/27/20 0503 01/27/20 0957 01/27/20 1247 01/28/20 0422 01/28/20 0909 01/29/20 0622  WBC 17.4*  --  20.9* 32.3*  --   --  25.8*  --  25.2*  CREATININE 0.79  --  1.31* 1.14*  --   --  0.89  --  0.86  LATICACIDVEN  --  1.0  --   --  2.3* 2.0*  --  1.7  --     Estimated Creatinine Clearance: 55.4 mL/min (by C-G formula based on SCr of 0.86 mg/dL).    Allergies  Allergen Reactions  . Hydrocodone     Other reaction(s): Unknown  . Naproxen     Other reaction(s): Unknown  . Other Other (See Comments)    nuts  . Sulfamethoxazole-Trimethoprim     Other reaction(s): Unknown  . Tramadol     Other reaction(s): Unknown Pt has tolerated  this med 1/5-1/7 with no issues    Antimicrobials this admission: 9/1 CTX >> 9/3 9/1 Azithromycin >> 9/3 9/4 Levofloxacin >> 9/7 9/6 Cefazolin >> 9/7 9/7 Cefepime >> 9/7 Vancomycin >>  Dose adjustments this admission: N/A  Microbiology results: 9/6 BCx: NGTD UCx: Enterococcus raffinosus 9/2 MRSA PCR: positive  Thank you for allowing pharmacy to be a part of this patient's care.  11/6, PharmD Pharmacy Resident  01/29/2020 12:43 PM

## 2020-01-30 ENCOUNTER — Inpatient Hospital Stay: Payer: Medicare Other

## 2020-01-30 DIAGNOSIS — S72044A Nondisplaced fracture of base of neck of right femur, initial encounter for closed fracture: Secondary | ICD-10-CM

## 2020-01-30 DIAGNOSIS — J9622 Acute and chronic respiratory failure with hypercapnia: Secondary | ICD-10-CM

## 2020-01-30 LAB — CBC WITH DIFFERENTIAL/PLATELET
Abs Immature Granulocytes: 0.22 10*3/uL — ABNORMAL HIGH (ref 0.00–0.07)
Basophils Absolute: 0 10*3/uL (ref 0.0–0.1)
Basophils Relative: 0 %
Eosinophils Absolute: 0.3 10*3/uL (ref 0.0–0.5)
Eosinophils Relative: 1 %
HCT: 23.8 % — ABNORMAL LOW (ref 36.0–46.0)
Hemoglobin: 8 g/dL — ABNORMAL LOW (ref 12.0–15.0)
Immature Granulocytes: 1 %
Lymphocytes Relative: 5 %
Lymphs Abs: 0.9 10*3/uL (ref 0.7–4.0)
MCH: 30.7 pg (ref 26.0–34.0)
MCHC: 33.6 g/dL (ref 30.0–36.0)
MCV: 91.2 fL (ref 80.0–100.0)
Monocytes Absolute: 0.9 10*3/uL (ref 0.1–1.0)
Monocytes Relative: 4 %
Neutro Abs: 18.1 10*3/uL — ABNORMAL HIGH (ref 1.7–7.7)
Neutrophils Relative %: 89 %
Platelets: 240 10*3/uL (ref 150–400)
RBC: 2.61 MIL/uL — ABNORMAL LOW (ref 3.87–5.11)
RDW: 14.6 % (ref 11.5–15.5)
WBC: 20.4 10*3/uL — ABNORMAL HIGH (ref 4.0–10.5)
nRBC: 0 % (ref 0.0–0.2)

## 2020-01-30 LAB — PROCALCITONIN: Procalcitonin: 0.17 ng/mL

## 2020-01-30 LAB — BASIC METABOLIC PANEL
Anion gap: 9 (ref 5–15)
BUN: 38 mg/dL — ABNORMAL HIGH (ref 8–23)
CO2: 32 mmol/L (ref 22–32)
Calcium: 7.9 mg/dL — ABNORMAL LOW (ref 8.9–10.3)
Chloride: 92 mmol/L — ABNORMAL LOW (ref 98–111)
Creatinine, Ser: 0.88 mg/dL (ref 0.44–1.00)
GFR calc Af Amer: >60 mL/min (ref >60)
GFR calc non Af Amer: >60 mL/min (ref >60)
Glucose, Bld: 114 mg/dL — ABNORMAL HIGH (ref 70–99)
Potassium: 3.7 mmol/L (ref 3.5–5.1)
Sodium: 133 mmol/L — ABNORMAL LOW (ref 135–145)

## 2020-01-30 LAB — MRSA PCR SCREENING: MRSA by PCR: NEGATIVE

## 2020-01-30 LAB — GLUCOSE, CAPILLARY: Glucose-Capillary: 112 mg/dL — ABNORMAL HIGH (ref 70–99)

## 2020-01-30 MED ORDER — AMOXICILLIN-POT CLAVULANATE 875-125 MG PO TABS
1.0000 | ORAL_TABLET | Freq: Two times a day (BID) | ORAL | Status: DC
  Administered 2020-01-30 – 2020-02-02 (×7): 1 via ORAL
  Filled 2020-01-30 (×7): qty 1

## 2020-01-30 MED ORDER — METHYLPREDNISOLONE SODIUM SUCC 40 MG IJ SOLR
40.0000 mg | Freq: Every day | INTRAMUSCULAR | Status: DC
  Administered 2020-01-30 – 2020-02-02 (×4): 40 mg via INTRAVENOUS
  Filled 2020-01-30 (×4): qty 1

## 2020-01-30 NOTE — Progress Notes (Addendum)
Palliative:   Alice Wallace is lying quietly in bed.  She is just finished with physical therapy.  She will greet me making and somewhat keeping eye contact.  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  There is no family at bedside at this time.  I encourage Alice Wallace to continue working with physical therapy.  She has a slight cough, and I also encouraged her greatly to continue turning coughing, deep breathing.  She nods affirmatively.  Call to DSS social worker, [place].  We talked about pulmonary consult and respiratory status, weaning oxygen.  We also talked about physical therapy, pain from surgery even with premedication prior to PT.  I share that Alice Wallace continues to attempt rehab.  Roderic Scarce states that she would request Alice Wallace in [facility] for short-term rehab, if possible.  She tells me that Alice Wallace has close friend in [place] and they may help with recovery.  Denies questions or concerns.  Conference with attending, bedside nursing staff, physical therapy, related to patient condition, needs, goals of care.  Plan: Continue to treat the treatable but no CPR or intubation.  Agreeable to short-term rehab, may need long-term care if unable to progress. Please set up outpatient palliative services to follow.  35 minutes Lillia Carmel, NP Palliative medicine team Team phone [phone] Greater than 50% of this time was spent counseling and coordinating care related to the above assessment and plan.

## 2020-01-30 NOTE — Progress Notes (Signed)
Subjective:  Patient reports pain as mild.    Objective:   VITALS:   Vitals:   01/29/20 2025 01/30/20 0417 01/30/20 0723 01/30/20 0724  BP:  (!) 112/48 (!) 109/58   Pulse:  62 75   Resp:  17 17   Temp:  98.2 F (36.8 C) 98.6 F (37 C)   TempSrc:   Oral   SpO2: 92% 92% 97% 92%  Weight:      Height:        PHYSICAL EXAM:  Neurologically intact ABD soft Neurovascular intact Sensation intact distally Intact pulses distally Dorsiflexion/Plantar flexion intact Incision: scant drainage No cellulitis present Compartment soft dressing changed  LABS  Results for orders placed or performed during the hospital encounter of 01/21/20 (from the past 24 hour(s))  Vancomycin, trough     Status: Abnormal   Collection Time: 01/29/20 10:29 AM  Result Value Ref Range   Vancomycin Tr 22 (HH) 15 - 20 ug/mL  Procalcitonin     Status: None   Collection Time: 01/29/20 10:29 AM  Result Value Ref Range   Procalcitonin 0.21 ng/mL  Brain natriuretic peptide     Status: Abnormal   Collection Time: 01/29/20 10:29 AM  Result Value Ref Range   B Natriuretic Peptide 2,160.7 (H) 0.0 - 100.0 pg/mL  Fibrin derivatives D-Dimer ([facility] only)     Status: Abnormal   Collection Time: 01/29/20 10:29 AM  Result Value Ref Range   Fibrin derivatives D-dimer ([facility]) 2,429.51 (H) 0.00 - 499.00 ng/mL (FEU)  CBC with Differential/Platelet     Status: Abnormal   Collection Time: 01/30/20  4:21 AM  Result Value Ref Range   WBC 20.4 (H) 4.0 - 10.5 K/uL   RBC 2.61 (L) 3.87 - 5.11 MIL/uL   Hemoglobin 8.0 (L) 12.0 - 15.0 g/dL   HCT 61.6 (L) 36 - 46 %   MCV 91.2 80.0 - 100.0 fL   MCH 30.7 26.0 - 34.0 pg   MCHC 33.6 30.0 - 36.0 g/dL   RDW 83.7 29.0 - 21.1 %   Platelets 240 150 - 400 K/uL   nRBC 0.0 0.0 - 0.2 %   Neutrophils Relative % 89 %   Neutro Abs 18.1 (H) 1.7 - 7.7 K/uL   Lymphocytes Relative 5 %   Lymphs Abs 0.9 0.7 - 4.0 K/uL   Monocytes Relative 4 %   Monocytes Absolute 0.9 0 - 1 K/uL    Eosinophils Relative 1 %   Eosinophils Absolute 0.3 0 - 0 K/uL   Basophils Relative 0 %   Basophils Absolute 0.0 0 - 0 K/uL   Immature Granulocytes 1 %   Abs Immature Granulocytes 0.22 (H) 0.00 - 0.07 K/uL  Basic metabolic panel     Status: Abnormal   Collection Time: 01/30/20  4:21 AM  Result Value Ref Range   Sodium 133 (L) 135 - 145 mmol/L   Potassium 3.7 3.5 - 5.1 mmol/L   Chloride 92 (L) 98 - 111 mmol/L   CO2 32 22 - 32 mmol/L   Glucose, Bld 114 (H) 70 - 99 mg/dL   BUN 38 (H) 8 - 23 mg/dL   Creatinine, Ser 1.55 0.44 - 1.00 mg/dL   Calcium 7.9 (L) 8.9 - 10.3 mg/dL   GFR calc non Af Amer >60 >60 mL/min   GFR calc Af Amer >60 >60 mL/min   Anion gap 9 5 - 15  Procalcitonin     Status: None   Collection Time: 01/30/20  4:21 AM  Result Value Ref Range   Procalcitonin 0.17 ng/mL  Glucose, capillary     Status: Abnormal   Collection Time: 01/30/20  6:05 AM  Result Value Ref Range   Glucose-Capillary 112 (H) 70 - 99 mg/dL    US Venous Img Lower Bilateral (DVT)  Result Date: 01/29/2020 CLINICAL DATA:  Hypoxia.  Elevated D-dimer. EXAM: BILATERAL LOWER EXTREMITY VENOUS DOPPLER ULTRASOUND TECHNIQUE: Gray-scale sonography with compression, as well as color and duplex ultrasound, were performed to evaluate the deep venous system(s) from the level of the common femoral vein through the popliteal and proximal calf veins. COMPARISON:  None. FINDINGS: VENOUS Normal compressibility of the common femoral, superficial femoral, and popliteal veins, as well as the visualized calf veins. Visualized portions of profunda femoral vein and great saphenous vein unremarkable. No filling defects to suggest DVT on grayscale or color Doppler imaging. Doppler waveforms show normal direction of venous flow, normal respiratory plasticity and response to augmentation. OTHER None. Limitations: none IMPRESSION: Negative. Electronically Signed   By: Katherine Mantle M.D.   On: 01/29/2020 18:03   DG Chest Port 1  View  Result Date: 01/29/2020 CLINICAL DATA:  79 year old female with history of hypoxia today. EXAM: PORTABLE CHEST 1 VIEW COMPARISON:  Chest x-ray 01/27/2020. FINDINGS: Severe emphysematous changes with extensive bullous disease in the right upper lobe. Widespread areas of interstitial prominence an ill-defined airspace disease noted throughout the mid to lower lungs bilaterally. No definite pleural effusions. No pneumothorax. No definite evidence of pulmonary edema. Mild cardiomegaly. Upper mediastinal contours are distorted by patient positioning. Numerous old healed bilateral rib fractures. IMPRESSION: 1. The appearance the chest is very similar to the prior study, again favored to reflect multilobar pneumonia. 2. Cardiomegaly. 3. Aortic atherosclerosis. 4. Emphysema. Electronically Signed   By: Trudie Reed M.D.   On: 01/29/2020 09:57    Assessment/Plan: 4 Days Post-Op   Principal Problem:   Acute respiratory failure with hypoxia and hypercapnia (HCC) Active Problems:   Hypokalemia   AKI (acute kidney injury) (HCC)   Nonspeaking deaf   COPD exacerbation (HCC)   Non-STEMI (non-ST elevated myocardial infarction) (HCC)   Hip fracture, unspecified laterality, closed, initial encounter (HCC)   Goals of care, counseling/discussion   Palliative care by specialist   Up with therapy , WBAT when medically stable OK to restart anticoagulation from ortho standpoint Discharge per medicine Follow up with [name] office, the week of September 13 for staple removal, Call office to confirm appt [phone] H/H holding steady    Altamese Cabal , PA-C 01/30/2020, 8:24 AM

## 2020-01-30 NOTE — Progress Notes (Signed)
PT Cancellation Note  Patient Details Name: Alice Wallace MRN: [id number] DOB: [birth year]   Cancelled Treatment:    Reason Eval/Treat Not Completed: Patient declined participation with PT services x 2 secondary to pain including offer of bed therex only.  Nursing notified and entered room to provide pain medication.  Will attempt to see pt at a future date/time as medically appropriate.     Ovidio Hanger PT, DPT 01/30/20, 3:01 PM

## 2020-01-30 NOTE — Progress Notes (Signed)
PROGRESS NOTE    Alice Wallace   WUJ:[id number]  DOB: 02-01-1941  PCP: Devoria Glassing, NP    DOA: 01/21/2020 LOS: 9   Brief Narrative   [facility] assumed care on 01/28/2020, patient out of ICU.  HPI and Hospital Course summarized below:  79 y.o. Caucasian female with a known history of COPD, hypertension, deafness and muteness, (can read lips and written communications) presented to the ED on 01/21/2020 after having a fall at her assisted living facility with subsequent right hip pain and significant respiratory distress and hypoxia with O2 sat in 70's on her usual 2 L/min oxygen.   Treated with Duoneb, albuterol neb and IV Solu-medrol by EMS for presumed COPD exacerbation.    In the ED, tachycardic HR 106, tachypneic RR 23, O2 sat 76% on room air, improved to 93-96% on 6 L/min.  CTA was negative for PE, but showed RLL atelectasis vs pneumonia and advanced emphysema.  Imaging consistent with R proximal femur fracture.   She is status post right hip ORIF on 01/26/2020 with [name].  She developed significant respiratory distress post-operatively, and has ongoing requirement for heated HFNC at rates of 40-50 L/min.  Pulmonology consulted.          Assessment & Plan   Principal Problem:   Acute respiratory failure with hypoxia and hypercapnia (HCC) Active Problems:   AKI (acute kidney injury) (HCC)   Chronic obstructive pulmonary disease with acute exacerbation (HCC)   Non-STEMI (non-ST elevated myocardial infarction) (HCC)   Hypokalemia   Hip fracture, unspecified laterality, closed, initial encounter (HCC)   Goals of care, counseling/discussion   Palliative care by specialist   Nonspeaking deaf   Nondisplaced fracture of base of neck of right femur, initial encounter for closed fracture (HCC)   Acute respiratory failure with hypoxia and hypercapnia -present on admission, improved, recurred post-operatively.  Due to acute heart failure in the setting of non-STEMI with acute  pulmonary edema and renal failure, underlying COPD/emphysema.   PE was ruled out by CTA chest, however bone marrow embolism s/p surgery is in the differential (would be supportive care). Expect prolonged recovery from PNA given severity of her underlying COPD/emphysema. --Continue supplemental oxygen as needed, maintain O2 sat greater than 88%, wean as tolerated --Does not tolerate BiPAP --Continue HFNC --Pulmonology consulted given persistent high O2 needs, appreciate their assistance   Community-acquired pneumonia - present on admission, RLL, suspect aspiration.  Initially respiratory failure attributed to cardiac issues so antibiotics were held.  Patient developed significant leukocytosis, and repeat chest xray on 9/7 showed stable bibasilar opacities.  Started on Vanc/Cefepime on 9/7, had initially received couple days Zithromax on admission.    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  Was treated with IV steroids, course completed. --Continue DuoNebs 3 times daily, PRN albuterol nebs --Mucinex --Solu-medrol 40 mg IV daily --Pulm is following, added Metanebs   Non-STEMI -present on admission, troponin peaked over 4k and downtrended.  Treating medically, heparin drip completed on 9/3. Acute diastolic CHF -likely secondary to non-STEMI.  Echo on 9/1 showed EF 60 to 65%, grade 1 diastolic dysfunction, no wall motion abnormalities. 9/9: BNP significantly higher than previous, with continued high oxygen requirement.   Net IO Since Admission: -5,790.09 mL [01/30/20 1748] --Cardiology consulted, signed off 9/7 --Follow-up with Dr. [name] 1 week after discharge --Continue aspirin, Plavix, high intensity  statin, Imdur --Cardizem CD was reduced to 180 mg --Metoprolol succinate 25 mg daily was added --Consider addition of ACE  inhibitor at discharge if renal function and blood pressure tolerate --Stopped IV Lasix, monitor volume status --Continue midodrine for BP support --maintain MAP greater than 65   Elevated D-dimer - had CTA chest on 9/1 which ruled out PE on admission, is getting Lovenox for VTE prophylaxis.   --9/9 lower extremity venous doppler U/S - negative for DVT bilaterally --Consider repeating CTA chest vs V/Q scan if oxygen requirements not improving.     Acute Kidney Injury - POA, resolved.  Monitor BMP daily.     Right hip fracture -present on admission, sustained during fall at ALF.  Underwent ORIF 01/26/2020 with [name]. --Work with PT --Weightbearing as tolerated once medically stable --Per Ortho, okay to resume anticoagulation   Generalized weakness - PT and OT are following, recommending SNF for rehab upon discharge.  GERD -continue oral Protonix daily  Depression/anxiety -continue Lexapro  Osteoporosis -takes Fosamax weekly.  Continue calcium and vitamin D supplements  History of iron deficiency -continue oral iron supplement BID with meals  Allergic rhinitis -continue loratadine   Patient BMI: Body mass index is 27.12 kg/m.   DVT prophylaxis: enoxaparin (LOVENOX) injection 40 mg Start: 01/27/20 2200 SCDs Start: 01/26/20 2057   Diet:  Diet Orders (From admission, onward)    Start     Ordered   01/26/20 2057  Diet regular Room service appropriate? Yes; Fluid consistency: Thin  Diet effective now       Question Answer Comment  Room service appropriate? Yes   Fluid consistency: Thin      01/26/20 2056            Code Status: DNR    Subjective 01/30/20    Patient seen at bedside.  No acute events reported.  Says she's feeling little better.  Still has productive cough, and coughed up some thick, light-tan appearing sputum during encounter.  Feels hot, no actual fever, no chills.  No chest pain.  Does say she feels short of breath, and her chest hurts, worse  with coughing.     Disposition Plan & Communication   Status is: Inpatient  Remains inpatient appropriate because:IV treatments appropriate due to intensity of illness or inability to take PO   Dispo: The patient is from: ALF              Anticipated d/c is to: SNF              Anticipated d/c date is: > 3 days              Patient currently is not medically stable to d/c.    Family Communication: none at bedside, will attempt to call. Patient's legal guardian is Ms. Theodosia Blender [phone]   Consults, Procedures, Significant Events   Consultants:   Cardiology  Palliative care  Orthopedics  Procedures:   R hip ORIF on 01/26/20  Antimicrobials:   Augmentin 9/10 >>  Vancomycin 9/7 >> 9/10  Cefepime 9/7 >> 9/10  Azithromycin 9/1 >> 9/3   Objective   Vitals:   01/30/20 0940 01/30/20 1120 01/30/20 1340 01/30/20 1518  BP: (!) 104/50 (!) 131/58  (!) 100/53  Pulse: 67 89  89  Resp:  16  17  Temp:  97.9 F (36.6 C)  97.7 F (36.5 C)  TempSrc:  Oral  Oral  SpO2:  97% 92% 96%  Weight:  Height:        Intake/Output Summary (Last 24 hours) at 01/30/2020 1748 Last data filed at 01/30/2020 1700 Gross per 24 hour  Intake 250 ml  Output 1250 ml  Net -1000 ml   Filed Weights   01/24/20 0538 01/25/20 0500 01/28/20 0313  Weight: 68.5 kg 73.9 kg 76.2 kg    Physical Exam:  General exam: awake, alert, no acute distress, nonverbal Respiratory system: diminished bases, rhonchi on right, on heated HFNC, mildly increased respiratory effort. Cardiovascular system: normal S1/S2, RRR, no pedal edema.   Gastrointestinal system: soft, NT, ND, +bowel sounds. Extremities: moves all, no cyanosis, normal tone Psychiatry: normal mood, congruent affect  Labs   Data Reviewed: I have personally reviewed following labs and imaging studies  CBC: Recent Labs  Lab 01/26/20 0425 01/27/20 0503 01/28/20 0422 01/29/20 0622 01/30/20 0421  WBC 20.9* 32.3* 25.8* 25.2*  20.4*  NEUTROABS  --   --  23.7* 23.2* 18.1*  HGB 11.0* 9.7* 8.5* 8.8* 8.0*  HCT 33.9* 28.3* 25.9* 26.3* 23.8*  MCV 91.6 91.0 92.2 92.3 91.2  PLT 465* 361 263 253 240   Basic Metabolic Panel: Recent Labs  Lab 01/26/20 0425 01/27/20 0503 01/28/20 0422 01/29/20 0622 01/30/20 0421  NA 136 133* 134* 134* 133*  K 4.0 4.1 3.0* 3.8 3.7  CL 91* 93* 94* 92* 92*  CO2 31 30 28 31  32  GLUCOSE 137* 146* 121* 126* 114*  BUN 26* 43* 40* 37* 38*  CREATININE 1.31* 1.14* 0.89 0.86 0.88  CALCIUM 9.0 8.1* 7.8* 8.2* 7.9*  MG  --   --   --  2.8*  --    GFR: Estimated Creatinine Clearance: 54.1 mL/min (by C-G formula based on SCr of 0.88 mg/dL). Liver Function Tests: No results for input(s): AST, ALT, ALKPHOS, BILITOT, PROT, ALBUMIN in the last 168 hours. No results for input(s): LIPASE, AMYLASE in the last 168 hours. No results for input(s): AMMONIA in the last 168 hours. Coagulation Profile: No results for input(s): INR, PROTIME in the last 168 hours. Cardiac Enzymes: No results for input(s): CKTOTAL, CKMB, CKMBINDEX, TROPONINI in the last 168 hours. BNP (last 3 results) No results for input(s): PROBNP in the last 8760 hours. HbA1C: No results for input(s): HGBA1C in the last 72 hours. CBG: Recent Labs  Lab 01/30/20 0605  GLUCAP 112*   Lipid Profile: No results for input(s): CHOL, HDL, LDLCALC, TRIG, CHOLHDL, LDLDIRECT in the last 72 hours. Thyroid Function Tests: No results for input(s): TSH, T4TOTAL, FREET4, T3FREE, THYROIDAB in the last 72 hours. Anemia Panel: No results for input(s): VITAMINB12, FOLATE, FERRITIN, TIBC, IRON, RETICCTPCT in the last 72 hours. Sepsis Labs: Recent Labs  Lab 01/25/20 0858 01/27/20 0957 01/27/20 1247 01/28/20 0909 01/29/20 1029 01/30/20 0421  PROCALCITON <0.10 0.29  --   --  0.21 0.17  LATICACIDVEN 1.0 2.3* 2.0* 1.7  --   --     Recent Results (from the past 240 hour(s))  SARS Coronavirus 2 by RT PCR (hospital order, performed in [facility]  hospital lab) Nasopharyngeal Nasopharyngeal Swab     Status: None   Collection Time: 01/21/20  2:53 AM   Specimen: Nasopharyngeal Swab  Result Value Ref Range Status   SARS Coronavirus 2 NEGATIVE NEGATIVE Final    Comment: (NOTE) SARS-CoV-2 target nucleic acids are NOT DETECTED.  The SARS-CoV-2 RNA is generally detectable in upper and lower respiratory specimens during the acute phase of infection. The lowest concentration of SARS-CoV-2 viral copies this assay can  detect is 250 copies / mL. A negative result does not preclude SARS-CoV-2 infection and should not be used as the sole basis for treatment or other patient management decisions.  A negative result may occur with improper specimen collection / handling, submission of specimen other than nasopharyngeal swab, presence of viral mutation(s) within the areas targeted by this assay, and inadequate number of viral copies (<250 copies / mL). A negative result must be combined with clinical observations, patient history, and epidemiological information.  Fact Sheet for Patients:   BoilerBrush.com.cy  Fact Sheet for Healthcare Providers: https://pope.com/  This test is not yet approved or  cleared by the [place] FDA and has been authorized for detection and/or diagnosis of SARS-CoV-2 by FDA under an Emergency Use Authorization (EUA).  This EUA will remain in effect (meaning this test can be used) for the duration of the COVID-19 declaration under Section 564(b)(1) of the Act, 21 U.S.C. section 360bbb-3(b)(1), unless the authorization is terminated or revoked sooner.  Performed at [facility], [address] [place], [place] [address]   MRSA PCR Screening     Status: Abnormal   Collection Time: 01/22/20  5:15 PM   Specimen: Nasopharyngeal  Result Value Ref Range Status   MRSA by PCR POSITIVE (A) NEGATIVE Final    Comment:        The GeneXpert MRSA Assay  (FDA approved for NASAL specimens only), is one component of a comprehensive MRSA colonization surveillance program. It is not intended to diagnose MRSA infection nor to guide or monitor treatment for MRSA infections. RESULT CALLED TO, READ BACK BY AND VERIFIED WITH: BEBRA EMBDEN 01/22/20 AT 2211 BY ACR Performed at [facility], [address] [place], [place] [address]   CULTURE, BLOOD (ROUTINE X 2) w Reflex to ID Panel     Status: None (Preliminary result)   Collection Time: 01/26/20  9:17 AM   Specimen: BLOOD  Result Value Ref Range Status   Specimen Description BLOOD LEFT ANTECUBITAL  Final   Special Requests   Final    BOTTLES DRAWN AEROBIC AND ANAEROBIC Blood Culture adequate volume   Culture   Final    NO GROWTH 4 DAYS Performed at [facility], [address] [place], [place] [address]    Report Status PENDING  Incomplete  CULTURE, BLOOD (ROUTINE X 2) w Reflex to ID Panel     Status: None (Preliminary result)   Collection Time: 01/26/20  9:22 AM   Specimen: BLOOD  Result Value Ref Range Status   Specimen Description BLOOD RIGHT ANTECUBITAL  Final   Special Requests   Final    BOTTLES DRAWN AEROBIC AND ANAEROBIC Blood Culture adequate volume   Culture   Final    NO GROWTH 4 DAYS Performed at [facility], [address] [place], [place] [address]    Report Status PENDING  Incomplete  Urine Culture     Status: Abnormal   Collection Time: 01/27/20  1:14 PM   Specimen: Urine, Random  Result Value Ref Range Status   Specimen Description   Final    URINE, RANDOM Performed at [facility], [address] [place], [place] [address]    Special Requests   Final    NONE Performed at [facility], [address] [place], [place] [address]    Culture 80,000 COLONIES/mL ENTEROCOCCUS RAFFINOSUS (A)  Final   Report Status 01/29/2020 FINAL  Final   Organism ID, Bacteria ENTEROCOCCUS RAFFINOSUS (A)  Final  Susceptibility   Enterococcus raffinosus - MIC*    AMPICILLIN 16 RESISTANT Resistant     NITROFURANTOIN 32 SENSITIVE Sensitive     VANCOMYCIN <=0.5 SENSITIVE Sensitive     * 80,000 COLONIES/mL ENTEROCOCCUS RAFFINOSUS  MRSA PCR Screening     Status: None   Collection Time: 01/30/20 10:08 AM   Specimen: Nasal Mucosa; Nasopharyngeal  Result Value Ref Range Status   MRSA by PCR NEGATIVE NEGATIVE Final    Comment:        The GeneXpert MRSA Assay (FDA approved for NASAL specimens only), is one component of a comprehensive MRSA colonization surveillance program. It is not intended to diagnose MRSA infection nor to guide or monitor treatment for MRSA infections. Performed at [facility], [address] [place], [place] [address]       Imaging Studies   US Venous Img Lower Bilateral (DVT)  Result Date: 01/29/2020 CLINICAL DATA:  Hypoxia.  Elevated D-dimer. EXAM: BILATERAL LOWER EXTREMITY VENOUS DOPPLER ULTRASOUND TECHNIQUE: Gray-scale sonography with compression, as well as color and duplex ultrasound, were performed to evaluate the deep venous system(s) from the level of the common femoral vein through the popliteal and proximal calf veins. COMPARISON:  None. FINDINGS: VENOUS Normal compressibility of the common femoral, superficial femoral, and popliteal veins, as well as the visualized calf veins. Visualized portions of profunda femoral vein and great saphenous vein unremarkable. No filling defects to suggest DVT on grayscale or color Doppler imaging. Doppler waveforms show normal direction of venous flow, normal respiratory plasticity and response to augmentation. OTHER None. Limitations: none IMPRESSION: Negative. Electronically Signed   By: Katherine Mantlehristopher  Green M.D.   On: 01/29/2020 18:03   DG Chest Port 1 View  Result Date: 01/30/2020 CLINICAL DATA:  Shortness of breath, hypoxemic respiratory failure EXAM: PORTABLE CHEST 1 VIEW COMPARISON:  Multiple prior chest x-rays.  FINDINGS: Image rotated to the RIGHT. Accounting for this cardiomediastinal contours are stable as well as hilar structures. Severe bullous changes again noted in the RIGHT upper chest. Background interstitial and airspace opacities are stable and favored to represent multifocal pneumonia. No pleural effusion. Posttraumatic changes about the bilateral chest are again noted with multiple healed rib fractures and signs of previous LEFT thoracotomy. IMPRESSION: No significant change in diffuse interstitial and airspace opacities favored to represent multifocal pneumonia in this patient with marked emphysema and severe bullous changes in the RIGHT upper lobe in particular. Electronically Signed   By: Donzetta KohutGeoffrey  Wile M.D.   On: 01/30/2020 08:39   DG Chest Port 1 View  Result Date: 01/29/2020 CLINICAL DATA:  79 year old female with history of hypoxia today. EXAM: PORTABLE CHEST 1 VIEW COMPARISON:  Chest x-ray 01/27/2020. FINDINGS: Severe emphysematous changes with extensive bullous disease in the right upper lobe. Widespread areas of interstitial prominence an ill-defined airspace disease noted throughout the mid to lower lungs bilaterally. No definite pleural effusions. No pneumothorax. No definite evidence of pulmonary edema. Mild cardiomegaly. Upper mediastinal contours are distorted by patient positioning. Numerous old healed bilateral rib fractures. IMPRESSION: 1. The appearance the chest is very similar to the prior study, again favored to reflect multilobar pneumonia. 2. Cardiomegaly. 3. Aortic atherosclerosis. 4. Emphysema. Electronically Signed   By: Trudie Reedaniel  Entrikin M.D.   On: 01/29/2020 09:57     Medications   Scheduled Meds: . acidophilus  1 capsule Oral TID  . alum & mag hydroxide-simeth  30 mL Oral TID AC & HS  . amoxicillin-clavulanate  1 tablet Oral Q12H  . aspirin EC  81 mg Oral Daily  . atorvastatin  80 mg Oral Daily  . calcium-vitamin D  1 tablet Oral BID  . cholecalciferol  5,000 Units  Oral Daily  . clopidogrel  75 mg Oral Daily  . diltiazem  180 mg Oral Daily  . docusate sodium  100 mg Oral BID  . enoxaparin (LOVENOX) injection  40 mg Subcutaneous Q24H  . escitalopram  10 mg Oral Daily  . feeding supplement (ENSURE ENLIVE)  237 mL Oral TID BM  . ferrous sulfate  325 mg Oral BID WC  . guaiFENesin  600 mg Oral Daily  . ipratropium-albuterol  3 mL Nebulization TID  . isosorbide mononitrate  30 mg Oral Daily  . loratadine  10 mg Oral Daily  . methylPREDNISolone (SOLU-MEDROL) injection  40 mg Intravenous Daily  . metoprolol succinate  25 mg Oral Daily  . midodrine  5 mg Oral TID WC  . pantoprazole  40 mg Oral Daily  . potassium chloride SA  40 mEq Oral Daily   Continuous Infusions:      LOS: 9 days    Time spent: 25 minutes with > 50% spent in coordination of care and direct patient contact.    Pennie Banter, DO [facility]  01/30/2020, 5:48 PM    If 7PM-7AM, please contact night-coverage. How to contact the [facility] Attending or Consulting provider 7A - 7P or covering provider during after hours 7P -7A, for this patient?    1. Check the care team in [facility] and look for a) attending/consulting [facility] provider listed and b) the [facility] team listed 2. Log into [email or web address] and use 's universal password to access. If you do not have the password, please contact the hospital operator. 3. Locate the [facility] provider you are looking for under [facility] and page to a number that you can be directly reached. 4. If you still have difficulty reaching the provider, please page the [facility] (Director on Call) for the Hospitalists listed on amion for assistance.

## 2020-01-30 NOTE — Progress Notes (Signed)
Physical Therapy Treatment Patient Details Name: Alice Wallace MRN: [id number] DOB: 23-Apr-1941 Today's Date: 01/30/2020    History of Present Illness Per MD notes: Pt is a 79 y.o. caucasian female with a known history of COPD, hypertension, deafness and muteness, presented to the emergency room with acute onset of fall at her assisted living facility with subsequent right hip pain and significant respiratory distress.  She was thought to be in COPD exacerbation was given IV Solu-Medrol and nebulized albuterol as well as duo nebs by EMS on route to the hospital.  Pt found to have a proximal right femoral diaphysis fracture and is now s/p R hip IM nail.  MD assessment also includes: Acute on chronic respiratory failure with COPD exacerbation /bronchitis, cannot rule out pneumonia, h/o CHF, suspected NSTEMI, HTN, hypokalemia, and depression.    PT Comments    Pt was pleasant and motivated to participate during the session.Pt able to communicate that she did not feel well enough to sit EOB or stand so all exercise was performed in supine. Pt able to perform all bed exercises with relative ease but with increased pain in R hip. Pt able to performed rolling with modified IND with bed rails. Pt is on HHNC with >30.1 L/min. SpO2 remained in the upper 90's throughout the session. Pt will benefit from PT services in a SNF setting upon discharge to safely address deficits listed in patient problem list for decreased caregiver assistance and eventual return to PLOF.    Follow Up Recommendations  SNF     Equipment Recommendations  Other (comment) (TBD at next venue of care)    Recommendations for Other Services       Precautions / Restrictions Precautions Precautions: Fall Restrictions Other Position/Activity Restrictions: Per Dr. Denton Lank 01/29/20 OK for pt to participate fully with therapy to pt tolerance    Mobility  Bed Mobility Overal bed mobility: Needs Assistance Bed Mobility:  Rolling Rolling: Modified independent (Device/Increase time) (able to perform with bedrails)         General bed mobility comments: deferred secondary to fatigue and pain  Transfers Overall transfer level: Needs assistance Equipment used: Rolling walker (2 wheeled)             General transfer comment: not attempted due to pt tolerance for activity  Ambulation/Gait             General Gait Details: not attempted due to pt tolerance for activity   Stairs             Wheelchair Mobility    Modified Rankin (Stroke Patients Only)       Balance                                            Cognition Arousal/Alertness: Awake/alert Behavior During Therapy: WFL for tasks assessed/performed Overall Cognitive Status: Within Functional Limits for tasks assessed                                 General Comments: Use of dry erase board this session. Pt indicated she could read lips and clean mask available but she prefers written communication. She responds well with nods and thumb up/down.      Exercises Total Joint Exercises Ankle Circles/Pumps: PROM;Left;Right;10 reps Quad Sets: AROM;Left;10 reps (AROM R 5 but painful) Gluteal  Sets: AROM;Both;10 reps Short Arc Quad:  (AAROM R 10) Heel Slides:  (AAROM R 5 reps) Hip ABduction/ADduction: AAROM;5 reps;Both Other Exercises Other Exercises: pt performs rolling L and R for changing. performed modified IND    General Comments        Pertinent Vitals/Pain Pain Assessment: 0-10 Pain Score: 3  Pain Location: Hip and stomach Pain Descriptors / Indicators: Discomfort;Moaning;Grimacing Pain Intervention(s): Limited activity within patient's tolerance;Monitored during session;Repositioned    Home Living                      Prior Function            PT Goals (current goals can now be found in the care plan section) Acute Rehab PT Goals Patient Stated Goal: non  verbal PT Goal Formulation: Patient unable to participate in goal setting Time For Goal Achievement: 02/09/20 Potential to Achieve Goals: Good    Frequency    BID      PT Plan Current plan remains appropriate    Co-evaluation              AM-PAC PT "6 Clicks" Mobility   Outcome Measure  Help needed turning from your back to your side while in a flat bed without using bedrails?: A Little Help needed moving from lying on your back to sitting on the side of a flat bed without using bedrails?: A Little Help needed moving to and from a bed to a chair (including a wheelchair)?: A Lot Help needed standing up from a chair using your arms (e.g., wheelchair or bedside chair)?: A Little Help needed to walk in hospital room?: Total Help needed climbing 3-5 steps with a railing? : Total 6 Click Score: 13    End of Session Equipment Utilized During Treatment: Oxygen Activity Tolerance: Patient limited by pain Patient left: in bed;with call bell/phone within reach;with bed alarm set Nurse Communication: Mobility status PT Visit Diagnosis: Unsteadiness on feet (R26.81);History of falling (Z91.81);Difficulty in walking, not elsewhere classified (R26.2);Muscle weakness (generalized) (M62.81);Pain Pain - Right/Left: Right Pain - part of body: Hip (Chest)     Time: [id number] PT Time Calculation (min) (ACUTE ONLY): 33 min  Charges:                        Nicolette Bang, SPT 01/30/20. 11:35 AM

## 2020-01-30 NOTE — Progress Notes (Signed)
Pharmacy Antibiotic Note  Alice Wallace is a 79 y.o. female admitted on 01/21/2020 with sepsis. Patient was initially on ceftriaxone and azithromycin then was switched to empiric cefazolin and PO levofloxacin (d/c'd 9/7). Pharmacy has been consulted for Vancomycin and Cefepime dosing. Initial LD of vancomycin given ~3 hours late.   9/1 CXR: Density at the right lateral lung base may reflect focal lung consolidation and/or small effusion. 9/4 CXR: Mild interval improvement. Decreased opacity at the right lung base consistent with improved pneumonia or atelectasis 9/9 CXR: similar to 9/4 - reflect multilobar PNA Day 10 total abx; on Day 4 cefepime and Vanc, WBC trending down after broadening abx, 25.8>25.2>20.4, afebrile, Scr 1.31>1.14>0.89>0.86>0.88 Ucx Enterococcus raffinosus (Amp R). 9/9 vanc trough supratherapeutic at 22 mcg/mL (goal 15-20)  Plan:  -Continue vancomycin 1250 mg IV q24h -Given cultures and MRSA PCR disontinue Cefepime 2g q12h -Monitor renal function and plan to re-check vanc trough prior to 5th dose of 1250 mg IV q24h if vanc is continued -Goal trough 15-65mcg/mL  Height: 5\' 6"  (167.6 cm) Weight: 76.2 kg (168 lb) IBW/kg (Calculated) : 59.3  Temp (24hrs), Avg:98.2 F (36.8 C), Min:97.8 F (36.6 C), Max:98.4 F (36.9 C)  Recent Labs  Lab 01/25/20 0401 01/25/20 0858 01/26/20 0425 01/27/20 0503 01/27/20 0957 01/27/20 1247 01/28/20 0422 01/28/20 0909 01/29/20 0622 01/29/20 1029 01/30/20 0421  WBC   < >  --  20.9* 32.3*  --   --  25.8*  --  25.2*  --  20.4*  CREATININE   < >  --  1.31* 1.14*  --   --  0.89  --  0.86  --  0.88  LATICACIDVEN  --  1.0  --   --  2.3* 2.0*  --  1.7  --   --   --   VANCOTROUGH  --   --   --   --   --   --   --   --   --  22*  --    < > = values in this interval not displayed.    Estimated Creatinine Clearance: 54.1 mL/min (by C-G formula based on SCr of 0.88 mg/dL).    Allergies  Allergen Reactions  . Hydrocodone     Other  reaction(s): Unknown  . Naproxen     Other reaction(s): Unknown  . Other Other (See Comments)    nuts  . Sulfamethoxazole-Trimethoprim     Other reaction(s): Unknown  . Tramadol     Other reaction(s): Unknown Pt has tolerated this med 1/5-1/7 with no issues    Antimicrobials this admission: 9/1 CTX >> 9/3 9/1 Azithromycin >> 9/3 9/4 Levofloxacin >> 9/7 9/6 Cefazolin >> 9/7 9/7 Cefepime >> 9/10 9/7 Vancomycin >>  Microbiology results: 9/6 BCx: NGTD UCx: Enterococcus raffinosus  9/2 MRSA PCR: positive  Thank you for allowing pharmacy to be a part of this patient's care.  11/2, PharmD Pharmacy Resident  01/30/2020 6:47 AM

## 2020-01-30 NOTE — Consult Note (Signed)
Pulmonary Medicine          Date: 01/30/2020,   MRN# [id number] Alice Wallace 02/05/1941     AdmissionWeight: 55.9 kg                 CurrentWeight: 76.2 kg  Referring physician: Dr Denton Lank    CHIEF COMPLAINT:   Acute hypoxemic respiratory failure   HISTORY OF PRESENT ILLNESS   79 yo F with hx of COPD, HTN, bilateral hearing loss comes from assisted living with respiratory distress after mechanical fall to right hip.  She was noted to be acutely hypoxemic with sPO2<75% on 2L/min [place].  She was treated with typical COPD care path. She had CTPE which was negative for VTE.  She was found to have acute Right proximal femoral fracture. Post operatively she has increased O2 requirement.    PAST MEDICAL HISTORY   Past Medical History:  Diagnosis Date  . COPD (chronic obstructive pulmonary disease) (HCC)   . Deaf   . Hypertension   . Osteoporosis      SURGICAL HISTORY   Past Surgical History:  Procedure Laterality Date  . INTRAMEDULLARY (IM) NAIL INTERTROCHANTERIC Right 01/26/2020   Procedure: Right Hip IM nail with TFNA;  Surgeon: Lyndle Herrlich, MD;  Location: [facility] ORS;  Service: Orthopedics;  Laterality: Right;     FAMILY HISTORY   History reviewed. No pertinent family history.   SOCIAL HISTORY   Social History   Tobacco Use  . Smoking status: Never Smoker  . Smokeless tobacco: Never Used  Substance Use Topics  . Alcohol use: Not Currently  . Drug use: Not Currently     MEDICATIONS    Home Medication:    Current Medication:  Current Facility-Administered Medications:  .  acetaminophen (TYLENOL) tablet 650 mg, 650 mg, Oral, Q6H PRN, 650 mg at 01/27/20 1631 **OR** acetaminophen (TYLENOL) suppository 650 mg, 650 mg, Rectal, Q6H PRN, Lyndle Herrlich, MD .  acidophilus (RISAQUAD) capsule 1 capsule, 1 capsule, Oral, TID, Shahmehdi, Seyed A, MD, 1 capsule at 01/29/20 2227 .  albuterol (PROVENTIL) (2.5 MG/3ML) 0.083% nebulizer solution 3 mL, 3 mL,  Inhalation, Q4H PRN, Lyndle Herrlich, MD .  alum & mag hydroxide-simeth (MAALOX/MYLANTA) 200-200-20 MG/5ML suspension 30 mL, 30 mL, Oral, TID AC & HS, Lyndle Herrlich, MD, 30 mL at 01/29/20 2227 .  aspirin EC tablet 81 mg, 81 mg, Oral, Daily, Lyndle Herrlich, MD, 81 mg at 01/29/20 0900 .  atorvastatin (LIPITOR) tablet 80 mg, 80 mg, Oral, Daily, Lyndle Herrlich, MD, 80 mg at 01/29/20 0859 .  calcium-vitamin D (OSCAL WITH D) 500-200 MG-UNIT per tablet 1 tablet, 1 tablet, Oral, BID, Lyndle Herrlich, MD, 1 tablet at 01/29/20 2227 .  ceFEPIme (MAXIPIME) 2 g in sodium chloride 0.9 % 100 mL IVPB, 2 g, Intravenous, Q12H, Rauer, Robyne Peers, RPH, Last Rate: 200 mL/hr at 01/29/20 2243, 2 g at 01/29/20 2243 .  cholecalciferol (VITAMIN D3) tablet 5,000 Units, 5,000 Units, Oral, Daily, Lyndle Herrlich, MD, 5,000 Units at 01/29/20 0859 .  clopidogrel (PLAVIX) tablet 75 mg, 75 mg, Oral, Daily, Leanora Ivanoff, PA-C, 75 mg at 01/29/20 0859 .  dextromethorphan (DELSYM) 30 MG/5ML liquid 30 mg, 30 mg, Oral, QHS PRN, Lyndle Herrlich, MD, 30 mg at 01/25/20 0858 .  diltiazem (CARDIZEM CD) 24 hr capsule 180 mg, 180 mg, Oral, Daily, Leanora Ivanoff, PA-C, 180 mg at 01/28/20 1052 .  diphenhydrAMINE (BENADRYL) capsule 25 mg, 25 mg, Oral, Q6H PRN,  Lyndle Herrlich, MD .  docusate sodium (COLACE) capsule 100 mg, 100 mg, Oral, BID, Lyndle Herrlich, MD, 100 mg at 01/29/20 2227 .  enoxaparin (LOVENOX) injection 40 mg, 40 mg, Subcutaneous, Q24H, Shahmehdi, Seyed A, MD, 40 mg at 01/29/20 2227 .  escitalopram (LEXAPRO) tablet 10 mg, 10 mg, Oral, Daily, Lyndle Herrlich, MD, 10 mg at 01/29/20 0901 .  feeding supplement (ENSURE ENLIVE) (ENSURE ENLIVE) liquid 237 mL, 237 mL, Oral, TID BM, Lyndle Herrlich, MD, 237 mL at 01/27/20 2021 .  ferrous sulfate tablet 325 mg, 325 mg, Oral, BID WC, Lyndle Herrlich, MD, 325 mg at 01/29/20 1640 .  furosemide (LASIX) injection 40 mg, 40 mg, Intravenous, Q12H, Esaw Grandchild A, DO, 40 mg at 01/30/20 0608 .   guaiFENesin (MUCINEX) 12 hr tablet 600 mg, 600 mg, Oral, Daily, Lyndle Herrlich, MD, 600 mg at 01/29/20 0900 .  HYDROmorphone (DILAUDID) injection 1 mg, 1 mg, Intravenous, Q3H PRN, Lyndle Herrlich, MD, 1 mg at 01/29/20 1338 .  ipratropium-albuterol (DUONEB) 0.5-2.5 (3) MG/3ML nebulizer solution 3 mL, 3 mL, Nebulization, TID, Lyndle Herrlich, MD, 3 mL at 01/30/20 0723 .  isosorbide mononitrate (IMDUR) 24 hr tablet 30 mg, 30 mg, Oral, Daily, Lyndle Herrlich, MD, 30 mg at 01/29/20 0900 .  lactated ringers infusion, , Intravenous, Continuous, [place], Hubbard Hartshorn, NP, Stopped at 01/27/20 1653 .  loperamide (IMODIUM) capsule 2 mg, 2 mg, Oral, Daily PRN, Lyndle Herrlich, MD .  loratadine (CLARITIN) tablet 10 mg, 10 mg, Oral, Daily, Lyndle Herrlich, MD, 10 mg at 01/29/20 0900 .  magnesium hydroxide (MILK OF MAGNESIA) suspension 30 mL, 30 mL, Oral, Daily PRN, Lyndle Herrlich, MD .  metoCLOPramide (REGLAN) tablet 5-10 mg, 5-10 mg, Oral, Q8H PRN **OR** metoCLOPramide (REGLAN) injection 5-10 mg, 5-10 mg, Intravenous, Q8H PRN, Lyndle Herrlich, MD .  metoprolol succinate (TOPROL-XL) 24 hr tablet 25 mg, 25 mg, Oral, Daily, Leanora Ivanoff, PA-C, 25 mg at 01/28/20 1052 .  midodrine (PROAMATINE) tablet 5 mg, 5 mg, Oral, TID WC, Shahmehdi, Seyed A, MD, 5 mg at 01/29/20 1640 .  morphine 2 MG/ML injection 2 mg, 2 mg, Intravenous, Q4H PRN, Esaw Grandchild A, DO, 2 mg at 01/29/20 0923 .  nitroGLYCERIN (NITROSTAT) SL tablet 0.4 mg, 0.4 mg, Sublingual, Q5 min PRN, Lyndle Herrlich, MD, 0.4 mg at 01/24/20 0920 .  ondansetron (ZOFRAN) tablet 4 mg, 4 mg, Oral, Q6H PRN **OR** ondansetron (ZOFRAN) injection 4 mg, 4 mg, Intravenous, Q6H PRN, Lyndle Herrlich, MD, 4 mg at 01/26/20 2158 .  ondansetron (ZOFRAN) tablet 4 mg, 4 mg, Oral, Q6H PRN **OR** ondansetron (ZOFRAN) injection 4 mg, 4 mg, Intravenous, Q6H PRN, Lyndle Herrlich, MD .  pantoprazole (PROTONIX) EC tablet 40 mg, 40 mg, Oral, Daily, Lyndle Herrlich, MD, 40 mg at 01/29/20  0900 .  potassium chloride SA (KLOR-CON) CR tablet 40 mEq, 40 mEq, Oral, Daily, Lyndle Herrlich, MD, 40 mEq at 01/29/20 0900 .  tiZANidine (ZANAFLEX) tablet 4 mg, 4 mg, Oral, Q8H PRN, Lyndle Herrlich, MD, 4 mg at 01/24/20 1610 .  traZODone (DESYREL) tablet 25 mg, 25 mg, Oral, QHS PRN, Lyndle Herrlich, MD .  vancomycin (VANCOREADY) IVPB 1250 mg/250 mL, 1,250 mg, Intravenous, Q24H, Rudene Christians B, RPH    ALLERGIES   Hydrocodone, Naproxen, Other, Sulfamethoxazole-trimethoprim, and Tramadol     REVIEW OF SYSTEMS    Review of Systems:  Gen:  Denies  fever, sweats, chills weigh loss  HEENT: Denies blurred vision, double vision, ear pain, eye pain, hearing loss, nose bleeds, sore throat Cardiac:  No dizziness, chest pain or heaviness, chest tightness,edema Resp:   Denies cough or sputum porduction, shortness of breath,wheezing, hemoptysis,  Gi: Denies swallowing difficulty, stomach pain, nausea or vomiting, diarrhea, constipation, bowel incontinence Gu:  Denies bladder incontinence, burning urine Ext:   Denies Joint pain, stiffness or swelling Skin: Denies  skin rash, easy bruising or bleeding or hives Endoc:  Denies polyuria, polydipsia , polyphagia or weight change Psych:   Denies depression, insomnia or hallucinations   Other:  All other systems negative   VS: BP (!) 109/58 (BP Location: Right Arm)   Pulse 75   Temp 98.6 F (37 C) (Oral)   Resp 17   Ht  (1.676 m)   Wt 76.2 kg   SpO2 92%   BMI 27.12 kg/m      PHYSICAL EXAM    GENERAL:NAD, no fevers, chills, no weakness no fatigue HEAD: Normocephalic, atraumatic.  EYES: Pupils equal, round, reactive to light. Extraocular muscles intact. No scleral icterus.  MOUTH: Moist mucosal membrane. Dentition intact. No abscess noted.  EAR, NOSE, THROAT: Clear without exudates. No external lesions.  NECK: Supple. No thyromegaly. No nodules. No JVD.  PULMONARY: Diffuse coarse rhonchi right sided  +wheezes CARDIOVASCULAR: S1 and S2. Regular rate and rhythm. No murmurs, rubs, or gallops. No edema. Pedal pulses 2+ bilaterally.  GASTROINTESTINAL: Soft, nontender, nondistended. No masses. Positive bowel sounds. No hepatosplenomegaly.  MUSCULOSKELETAL: No swelling, clubbing, or edema. Range of motion full in all extremities.  NEUROLOGIC: Cranial nerves II through XII are intact. No gross focal neurological deficits. Sensation intact. Reflexes intact.  SKIN: No ulceration, lesions, rashes, or cyanosis. Skin warm and dry. Turgor intact.  PSYCHIATRIC: Mood, affect within normal limits. The patient is awake, alert and oriented x 3. Insight, judgment intact.       IMAGING    DG Chest 2 View  Result Date: 01/27/2020 CLINICAL DATA:  Shortness of breath. EXAM: CHEST - 2 VIEW COMPARISON:  January 26, 2020. FINDINGS: Stable cardiomegaly. Stable old bilateral rib fractures. Severe emphysematous disease is noted in both lungs, right greater than left. No definite pneumothorax is noted. No definite pleural effusion is noted. Stable bibasilar opacities are noted which may represent pneumonia or possibly edema. Small bilateral pleural effusions may be present. IMPRESSION: Stable bibasilar opacities are noted which may represent pneumonia or possibly edema. Stable severe emphysematous disease is noted in both lungs, right greater than left. Small bilateral pleural effusions may be present. Aortic Atherosclerosis (ICD10-I70.0) and Emphysema (ICD10-J43.9). Electronically Signed   By: Lupita Raider M.D.   On: 01/27/2020 13:55   CT Angio Chest PE W/Cm &/Or Wo Cm  Result Date: 01/21/2020 CLINICAL DATA:  Fall and shortness of breath. EXAM: CT ANGIOGRAPHY CHEST WITH CONTRAST TECHNIQUE: Multidetector CT imaging of the chest was performed using the standard protocol during bolus administration of intravenous contrast. Multiplanar CT image reconstructions and MIPs were obtained to evaluate the vascular anatomy.  CONTRAST:  75mL OMNIPAQUE IOHEXOL 350 MG/ML SOLN COMPARISON:  06/09/2019 FINDINGS: Cardiovascular: Prominent right ventricular size. No pericardial effusion. Aortic and great vessel atherosclerotic calcification. No pulmonary artery filling defect is seen. There is significant motion artifact at the lung bases. Mediastinum/Nodes: Small to moderate sliding hiatal hernia. No adenopathy. Lungs/Pleura: Opacity in the right lower lobe with volume loss. No edema, effusion, or pneumothorax. Panlobular emphysema. Dystrophic calcifications in the apical lungs. Bronchomalacia. Upper Abdomen: Atherosclerotic calcification. Musculoskeletal:  Healing anterior and lateral left rib fractures. Review of the MIP images confirms the above findings. IMPRESSION: 1. Right lower lobe atelectasis versus pneumonia. 2. Moderately motion degraded chest CTA. No evidence of pulmonary embolism. 3. Advanced emphysema. Aortic Atherosclerosis (ICD10-I70.0) and Emphysema (ICD10-J43.9). Electronically Signed   By: Marnee Spring M.D.   On: 01/21/2020 05:55   CT Hip Right Wo Contrast  Result Date: 01/21/2020 CLINICAL DATA:  Hip trauma.  Fracture suspected. EXAM: CT OF THE RIGHT HIP WITHOUT CONTRAST TECHNIQUE: Multidetector CT imaging of the right hip was performed according to the standard protocol. Multiplanar CT image reconstructions were also generated. COMPARISON:  Hip x-rays from earlier same day. FINDINGS: Bones/Joint/Cartilage Linear lucency identified in the lateral cortex of the proximal right femoral diaphysis associated with focal cortical thickening. Imaging features compatible with nondisplaced stress fracture. The abnormality is included on the extreme inferior aspect of this study and is best visualized on sagittal reconstruction image 21 of series 5. Degenerative changes are noted in the right hip. Ligaments Suboptimally assessed by CT. Muscles and Tendons No substantial hemorrhage noted in the proximal thigh musculature. Soft  tissues Distended urinary bladder is incompletely visualized. IMPRESSION: Linear lucency in the lateral cortex of the proximal right femoral diaphysis associated with focal cortical thickening. Imaging features compatible with nondisplaced stress fracture. Consider MRI of the proximal right femur for definitive characterization. Electronically Signed   By: Kennith Center M.D.   On: 01/21/2020 06:02   US Venous Img Lower Bilateral (DVT)  Result Date: 01/29/2020 CLINICAL DATA:  Hypoxia.  Elevated D-dimer. EXAM: BILATERAL LOWER EXTREMITY VENOUS DOPPLER ULTRASOUND TECHNIQUE: Gray-scale sonography with compression, as well as color and duplex ultrasound, were performed to evaluate the deep venous system(s) from the level of the common femoral vein through the popliteal and proximal calf veins. COMPARISON:  None. FINDINGS: VENOUS Normal compressibility of the common femoral, superficial femoral, and popliteal veins, as well as the visualized calf veins. Visualized portions of profunda femoral vein and great saphenous vein unremarkable. No filling defects to suggest DVT on grayscale or color Doppler imaging. Doppler waveforms show normal direction of venous flow, normal respiratory plasticity and response to augmentation. OTHER None. Limitations: none IMPRESSION: Negative. Electronically Signed   By: Katherine Mantle M.D.   On: 01/29/2020 18:03   DG Chest Port 1 View  Result Date: 01/29/2020 CLINICAL DATA:  79 year old female with history of hypoxia today. EXAM: PORTABLE CHEST 1 VIEW COMPARISON:  Chest x-ray 01/27/2020. FINDINGS: Severe emphysematous changes with extensive bullous disease in the right upper lobe. Widespread areas of interstitial prominence an ill-defined airspace disease noted throughout the mid to lower lungs bilaterally. No definite pleural effusions. No pneumothorax. No definite evidence of pulmonary edema. Mild cardiomegaly. Upper mediastinal contours are distorted by patient positioning.  Numerous old healed bilateral rib fractures. IMPRESSION: 1. The appearance the chest is very similar to the prior study, again favored to reflect multilobar pneumonia. 2. Cardiomegaly. 3. Aortic atherosclerosis. 4. Emphysema. Electronically Signed   By: Trudie Reed M.D.   On: 01/29/2020 09:57   DG Chest Port 1 View  Result Date: 01/26/2020 CLINICAL DATA:  Shortness of breath. EXAM: PORTABLE CHEST 1 VIEW COMPARISON:  January 24, 2020 FINDINGS: Stably enlarged cardiac silhouette. Calcific atherosclerotic disease of the aorta. Upper lobe predominant moderate to severe emphysematous changes. Persistent patchy interstitial and alveolar opacities in bilateral lower lobes. No evidence of pneumothorax or pleural effusion. Stable chest wall deformity from prior trauma. IMPRESSION: 1. Persistent patchy interstitial and alveolar opacities in  bilateral lower lobes may represent mixed pattern pulmonary edema or bronchopneumonia. 2. Moderate to severe emphysema. 3. Stably enlarged cardiac silhouette. Electronically Signed   By: Ted Mcalpine M.D.   On: 01/26/2020 09:15   DG Chest Port 1 View  Result Date: 01/24/2020 CLINICAL DATA:  COPD exacerbation. Pt presented 01/21/2020 to the emergency room with acute onset of fall at her assisted living facility with subsequent right hip pain and significant respiratory distress. Hx - COPD, HTN, non-smoker. EXAM: PORTABLE CHEST 1 VIEW COMPARISON:  01/21/2020 FINDINGS: Changes of emphysema skeletal lung scarring and interstitial thickening are stable. Opacity at the right lung base has mildly improved from the previous study. No new lung abnormalities. No convincing pleural effusion and no pneumothorax. Cardiac silhouette is normal in size. Multiple bilateral old rib fractures are stable. IMPRESSION: 1. Mild interval improvement. Decreased opacity at the right lung base consistent with improved pneumonia or atelectasis. 2. No other change. Stable chronic findings including  extensive emphysema. Electronically Signed   By: Amie Portland M.D.   On: 01/24/2020 10:43   DG Chest Portable 1 View  Result Date: 01/21/2020 CLINICAL DATA:  Larey Seat, short of breath, hypoxia EXAM: PORTABLE CHEST 1 VIEW COMPARISON:  06/07/2019 FINDINGS: Single frontal view of the chest demonstrates a stable cardiac silhouette. Extensive bullous emphysematous changes are seen, greatest at the right apex. Marked areas of scarring and fibrosis are seen throughout the lungs. Increased density at the right costophrenic angle may reflect consolidation and/or effusion. Chronic posttraumatic changes of the thoracic cage. No acute displaced fractures. IMPRESSION: 1. 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 2. Density at the right lateral lung base may reflect focal lung consolidation and/or small effusion. Electronically Signed   By: Sharlet Salina M.D.   On: 01/21/2020 03:48   ECHOCARDIOGRAM COMPLETE  Result Date: 01/21/2020    ECHOCARDIOGRAM REPORT   Patient Name:   [facility] Date of Exam: 01/21/2020 Medical Rec #:  [id number]     Height:       66.0 in Accession #:    [id number]    Weight:       123.2 lb Date of Birth:  [birth year]      BSA:          1.628 m Patient Age:    79 years      BP:           116/63 mmHg Patient Gender: F             HR:           74 bpm. Exam Location:  [facility] Procedure: 2D Echo, Cardiac Doppler and Color Doppler Indications:     NSTEMI 121.4  History:         Patient has no prior history of Echocardiogram examinations.                  COPD; Risk Factors:Hypertension.  Sonographer:     Cristela Blue RDCS (AE) Referring Phys:  [id number] Vernetta Honey MANSY Diagnosing Phys: Debbe Odea MD IMPRESSIONS  1. Left ventricular ejection fraction, by estimation, is 60 to 65%. The left ventricle has normal function. The left ventricle has no regional wall motion abnormalities. Left ventricular diastolic parameters are consistent with Grade I diastolic dysfunction (impaired  relaxation).  2. Right ventricular systolic function is normal. The right ventricular size is normal.  3. Left atrial size was moderately dilated.  4. The mitral valve is normal in structure. No evidence of mitral  valve regurgitation. No evidence of mitral stenosis.  5. The aortic valve is normal in structure. Aortic valve regurgitation is not visualized. No aortic stenosis is present.  6. The inferior vena cava is normal in size with greater than 50% respiratory variability, suggesting right atrial pressure of 3 mmHg. FINDINGS  Left Ventricle: Left ventricular ejection fraction, by estimation, is 60 to 65%. The left ventricle has normal function. The left ventricle has no regional wall motion abnormalities. The left ventricular internal cavity size was normal in size. There is  no left ventricular hypertrophy. Left ventricular diastolic parameters are consistent with Grade I diastolic dysfunction (impaired relaxation). Right Ventricle: The right ventricular size is normal. No increase in right ventricular wall thickness. Right ventricular systolic function is normal. Left Atrium: Left atrial size was moderately dilated. Right Atrium: Right atrial size was normal in size. Pericardium: There is no evidence of pericardial effusion. Mitral Valve: The mitral valve is normal in structure. Normal mobility of the mitral valve leaflets. No evidence of mitral valve regurgitation. No evidence of mitral valve stenosis. Tricuspid Valve: The tricuspid valve is normal in structure. Tricuspid valve regurgitation is not demonstrated. No evidence of tricuspid stenosis. Aortic Valve: The aortic valve is normal in structure. Aortic valve regurgitation is not visualized. No aortic stenosis is present. Aortic valve mean gradient measures 3.5 mmHg. Aortic valve peak gradient measures 6.5 mmHg. Aortic valve area, by VTI measures 2.46 cm. Pulmonic Valve: The pulmonic valve was not well visualized. Pulmonic valve regurgitation is not  visualized. No evidence of pulmonic stenosis. Aorta: The aortic root is normal in size and structure. Venous: The inferior vena cava was not well visualized. The inferior vena cava is normal in size with greater than 50% respiratory variability, suggesting right atrial pressure of 3 mmHg. IAS/Shunts: No atrial level shunt detected by color flow Doppler.  LEFT VENTRICLE PLAX 2D LVIDd:         4.77 cm  Diastology LVIDs:         3.08 cm  LV e' lateral:   7.83 cm/s LV PW:         1.15 cm  LV E/e' lateral: 11.6 LV IVS:        1.11 cm  LV e' medial:    5.87 cm/s LVOT diam:     2.00 cm  LV E/e' medial:  15.5 LV SV:         68 LV SV Index:   42 LVOT Area:     3.14 cm  RIGHT VENTRICLE RV Basal diam:  3.63 cm RV S prime:     12.40 cm/s TAPSE (M-mode): 3.6 cm LEFT ATRIUM             Index       RIGHT ATRIUM           Index LA diam:        4.80 cm 2.95 cm/m  RA Area:     15.10 cm LA Vol (A2C):   83.7 ml 51.42 ml/m RA Volume:   35.80 ml  21.99 ml/m LA Vol (A4C):   96.5 ml 59.28 ml/m LA Biplane Vol: 91.0 ml 55.90 ml/m  AORTIC VALVE                   PULMONIC VALVE AV Area (Vmax):    2.25 cm    PV Vmax:        0.92 m/s AV Area (Vmean):   2.51 cm    PV Peak grad:  3.4 mmHg AV Area (VTI):     2.46 cm    RVOT Peak grad: 5 mmHg AV Vmax:           127.00 cm/s AV Vmean:          86.450 cm/s AV VTI:            0.277 m AV Peak Grad:      6.5 mmHg AV Mean Grad:      3.5 mmHg LVOT Vmax:         90.90 cm/s LVOT Vmean:        69.000 cm/s LVOT VTI:          0.217 m LVOT/AV VTI ratio: 0.78  AORTA Ao Root diam: 3.50 cm MITRAL VALVE                TRICUSPID VALVE MV Area (PHT): 3.34 cm     TR Peak grad:   30.7 mmHg MV Decel Time: 227 msec     TR Vmax:        277.00 cm/s MV E velocity: 91.20 cm/s MV A velocity: 137.00 cm/s  SHUNTS MV E/A ratio:  0.67         Systemic VTI:  0.22 m                             Systemic Diam: 2.00 cm Debbe Odea MD Electronically signed by Debbe Odea MD Signature Date/Time: 01/21/2020/4:26:00 PM     Final    DG HIP OPERATIVE UNILAT W OR W/O PELVIS RIGHT  Result Date: 01/26/2020 CLINICAL DATA:  Portable operative images for right proximal femur ORIF. EXAM: OPERATIVE RIGHT HIP (WITH PELVIS IF PERFORMED) 5 VIEWS TECHNIQUE: Fluoroscopic spot image(s) were submitted for interpretation post-operatively. COMPARISON:  01/25/2020 FINDINGS: Five spot fluoro graphic operative images show placement of a long medullary rod through the femur supporting a compression screw. The proximal femur fracture components have been reduced, with the distal fracture component minimally displaced medial compared to the proximal component by 6-7 mm. Orthopedic hardware appears well seated and aligned. IMPRESSION: 1. Significant reduction of the proximal right femur fracture following ORIF. Electronically Signed   By: Amie Portland M.D.   On: 01/26/2020 18:41   DG HIP UNILAT WITH PELVIS 2-3 VIEWS RIGHT  Result Date: 01/25/2020 CLINICAL DATA:  Dislocated hip, right, initial encounter. Worsening right hip pain. EXAM: DG HIP (WITH OR WITHOUT PELVIS) 2-3V RIGHT COMPARISON:  Radiograph and CT 01/21/2020, radiograph 07/04/2019 FINDINGS: The previous incomplete subtrochanteric fracture of the right hip is now a complete fracture, displaced and significantly angulated with apex anterior angulation of 79 degrees. Fracture may be bisphosphonate related with cortical thickening noted on February 2021 exam. The femoral head remains seated in the acetabulum without dislocation. Moderate right hip osteoarthritis. Pubic rami are intact. IMPRESSION: 1. The previous incomplete subtrochanteric fracture is now a complete fracture, displaced with significant angulation. 2. Femoral head is well seated in the acetabulum without dislocation. Electronically Signed   By: Narda Rutherford M.D.   On: 01/25/2020 17:15   DG Hip Unilat W or Wo Pelvis 2-3 Views Right  Result Date: 01/21/2020 CLINICAL DATA:  Larey Seat, pain EXAM: DG HIP (WITH OR WITHOUT PELVIS)  2-3V RIGHT COMPARISON:  01/31/2017 FINDINGS: Frontal view of the pelvis as well as frontal and cross-table lateral views of the right hip are obtained. There is a nondisplaced transverse fracture through the proximal femoral diaphysis. Alignment is anatomic. There  are no other acute displaced fractures. The hips are well aligned. Mild bilateral hip osteoarthritis. Sacroiliac joints are normal. IMPRESSION: 1. Nondisplaced transverse fracture through the proximal right femoral diaphysis. Fracture line involves an area of chronic focal cortical thickening, and likely reflects an acute fracture at a site of chronic stress. Electronically Signed   By: Sharlet SalinaMichael  Brown M.D.   On: 01/21/2020 03:46      ASSESSMENT/PLAN    Acute hypoxemic respiratory failure -s/p mechanical fall unknown time down - has RLL consolidated infiltrate, unclearly defined due to significant motion artifact as shown in pictorial documentation above.  - patient may have aspirated with RLL infiltrate -    - PE has been ruled out but bone marrow embolization is still a possible and is treated with supportive care only  - there is advanced emphysema and this patient will have prolonged recovery time -aggressive chest physiotherapy is recommended - BPH with IS and MetaNEB BID -Procalcitonin with mild elevation - consider de-escalation to augmentin  -leukocytosis with left shift suggestive of infectious process with partial stress response -patient on vancomycin and cefepime -will order nasal MRSA , may deescalate vanco if negative -patient is on LR at 5475ml/hr and Lasix 40 BID, on examination she is euvolemic and id like to d/c both for now -repeat CXR this am   Advanced COPD with bullous emphysema   - continue typical COPD care path   -will add solumedrol 40 daily    -patient with overall poor prognosis would recommend Palliative care evaluation    Thank you for allowing me to participate in the care of this patient.     Patient/Family are satisfied with care plan and all questions have been answered.  This document was prepared using Dragon voice recognition software and may include unintentional dictation errors.     Vida RiggerFuad Navada Osterhout, M.D.  Division of Pulmonary & Critical Care Medicine  [facility]

## 2020-01-31 LAB — CULTURE, BLOOD (ROUTINE X 2)
Culture: NO GROWTH
Culture: NO GROWTH
Special Requests: ADEQUATE
Special Requests: ADEQUATE

## 2020-01-31 LAB — BASIC METABOLIC PANEL
Anion gap: 11 (ref 5–15)
BUN: 33 mg/dL — ABNORMAL HIGH (ref 8–23)
CO2: 32 mmol/L (ref 22–32)
Calcium: 8.2 mg/dL — ABNORMAL LOW (ref 8.9–10.3)
Chloride: 91 mmol/L — ABNORMAL LOW (ref 98–111)
Creatinine, Ser: 0.8 mg/dL (ref 0.44–1.00)
GFR calc Af Amer: >60 mL/min (ref >60)
GFR calc non Af Amer: >60 mL/min (ref >60)
Glucose, Bld: 102 mg/dL — ABNORMAL HIGH (ref 70–99)
Potassium: 3.2 mmol/L — ABNORMAL LOW (ref 3.5–5.1)
Sodium: 134 mmol/L — ABNORMAL LOW (ref 135–145)

## 2020-01-31 LAB — CBC
HCT: 23.8 % — ABNORMAL LOW (ref 36.0–46.0)
Hemoglobin: 8 g/dL — ABNORMAL LOW (ref 12.0–15.0)
MCH: 30 pg (ref 26.0–34.0)
MCHC: 33.6 g/dL (ref 30.0–36.0)
MCV: 89.1 fL (ref 80.0–100.0)
Platelets: 246 10*3/uL (ref 150–400)
RBC: 2.67 MIL/uL — ABNORMAL LOW (ref 3.87–5.11)
RDW: 14.6 % (ref 11.5–15.5)
WBC: 21.2 10*3/uL — ABNORMAL HIGH (ref 4.0–10.5)
nRBC: 0 % (ref 0.0–0.2)

## 2020-01-31 MED ORDER — FUROSEMIDE 20 MG PO TABS
20.0000 mg | ORAL_TABLET | Freq: Every day | ORAL | Status: DC
  Administered 2020-02-01 – 2020-02-03 (×3): 20 mg via ORAL
  Filled 2020-01-31 (×3): qty 1

## 2020-01-31 MED ORDER — POTASSIUM CHLORIDE CRYS ER 20 MEQ PO TBCR
40.0000 meq | EXTENDED_RELEASE_TABLET | Freq: Once | ORAL | Status: AC
  Administered 2020-01-31: 40 meq via ORAL
  Filled 2020-01-31: qty 2

## 2020-01-31 NOTE — Progress Notes (Signed)
Physical Therapy Treatment Patient Details Name: Alice Wallace MRN: [id number] DOB: 1941/01/08 Today's Date: 01/31/2020    History of Present Illness Per MD notes: Pt is a 79 y.o. caucasian female with a known history of COPD, hypertension, deafness and muteness, presented to the emergency room with acute onset of fall at her assisted living facility with subsequent right hip pain and significant respiratory distress.  She was thought to be in COPD exacerbation was given IV Solu-Medrol and nebulized albuterol as well as duo nebs by EMS on route to the hospital.  Pt found to have a proximal right femoral diaphysis fracture and is now s/p R hip IM nail.  MD assessment also includes: Acute on chronic respiratory failure with COPD exacerbation /bronchitis, cannot rule out pneumonia, h/o CHF, suspected NSTEMI, HTN, hypokalemia, and depression.    PT Comments    Pt was long sitting in bed, comfortable and awake upon arriving. She gives thumbs up and request to ambulate using hand gestures. Therapist was requested to attempt to wean O2 during session. Pt was on 30 L HFNC upon arriving with sao2 98%. Weaned to 5 L HFNC and able to maintain > 90% throughout. She was able to sit up on R side of bed with assistance and increased time due to pain. Pt reports severe dizziness and needed to return to supine quickly. BP once back in bed 119/53 MAP (73). Pt was soiled with BM and requested not to try to attempt getting up again. RN/RN tech notified. She will benefit from continued skilled therapy at a rehab when medically stable to address deficits and assist pt with returning to PLOF. Therapist will return later this date per POC.     Follow Up Recommendations  SNF     Equipment Recommendations  Other (comment) (defer to next level of care)    Recommendations for Other Services       Precautions / Restrictions Precautions Precautions: Fall Precaution Comments: watch BP/orthostatic Restrictions Weight  Bearing Restrictions: Yes RLE Weight Bearing: Weight bearing as tolerated    Mobility  Bed Mobility Overal bed mobility: Needs Assistance Bed Mobility: Rolling Rolling: Modified independent (Device/Increase time)   Supine to sit: Min assist Sit to supine: Min assist   General bed mobility comments: Pt was able to roll to side to short sit however endorses severe dizziness and required to return to supine quickly. BP once returned to supine 119/73. unwilling to trial sitting up 2nd trial and needed to be cleaned form having BM.  Transfers      General transfer comment: unable to attempt 2/2 to severe dizziness upon sitting up EOB         Balance Overall balance assessment: Needs assistance Sitting-balance support: Feet unsupported;Bilateral upper extremity supported Sitting balance-Leahy Scale: Good Sitting balance - Comments: no LOB however only able to sit EOB x ~ 10-20 secounds prior to needing to return to supine.              Cognition Arousal/Alertness: Awake/alert Behavior During Therapy: WFL for tasks assessed/performed Overall Cognitive Status: Within Functional Limits for tasks assessed          General Comments: Pt is non verbal but able to follow commands consistently. Used pen and paper to communicate at times             Pertinent Vitals/Pain Pain Assessment: Faces Faces Pain Scale: Hurts whole lot Pain Location: Hip and stomach Pain Descriptors / Indicators: Discomfort;Moaning;Grimacing Pain Intervention(s): Limited activity within patient's tolerance;Monitored during  session;Premedicated before session;Repositioned           PT Goals (current goals can now be found in the care plan section) Acute Rehab PT Goals Patient Stated Goal: non verbal Progress towards PT goals: Progressing toward goals    Frequency    BID      PT Plan Current plan remains appropriate    Co-evaluation     PT goals addressed during session:  Mobility/safety with mobility        AM-PAC PT "6 Clicks" Mobility   Outcome Measure  Help needed turning from your back to your side while in a flat bed without using bedrails?: A Little Help needed moving from lying on your back to sitting on the side of a flat bed without using bedrails?: A Little Help needed moving to and from a bed to a chair (including a wheelchair)?: A Little Help needed standing up from a chair using your arms (e.g., wheelchair or bedside chair)?: A Little Help needed to walk in hospital room?: A Lot Help needed climbing 3-5 steps with a railing? : A Lot 6 Click Score: 16    End of Session Equipment Utilized During Treatment: Oxygen Activity Tolerance: Patient limited by pain;Treatment limited secondary to medical complications (Comment) (limited by othostatic dizziness) Patient left: in bed;with call bell/phone within reach;with bed alarm set Nurse Communication: Mobility status PT Visit Diagnosis: Unsteadiness on feet (R26.81);History of falling (Z91.81);Difficulty in walking, not elsewhere classified (R26.2);Muscle weakness (generalized) (M62.81);Pain Pain - Right/Left: Right Pain - part of body: Hip     Time: 1010-1035 PT Time Calculation (min) (ACUTE ONLY): 25 min  Charges:  $Therapeutic Activity: 23-37 mins                     Jetta Lout PTA 01/31/20, 12:24 PM

## 2020-01-31 NOTE — Progress Notes (Signed)
PROGRESS NOTE    Alice Wallace   DXI:[id number]  DOB: Dec 05, 1940  PCP: Devoria Glassing, NP    DOA: 01/21/2020 LOS: 10   Brief Narrative   [facility] assumed care on 01/28/2020, patient out of ICU.  HPI and Hospital Course summarized below:  79 y.o. Caucasian female with a known history of COPD, hypertension, deafness and muteness, (can read lips and written communications) presented to the ED on 01/21/2020 after having a fall at her assisted living facility with subsequent right hip pain and significant respiratory distress and hypoxia with O2 sat in 70's on her usual 2 L/min oxygen.   Treated with Duoneb, albuterol neb and IV Solu-medrol by EMS for presumed COPD exacerbation.    In the ED, tachycardic HR 106, tachypneic RR 23, O2 sat 76% on room air, improved to 93-96% on 6 L/min.  CTA was negative for PE, but showed RLL atelectasis vs pneumonia and advanced emphysema.  Imaging consistent with R proximal femur fracture.   She is status post right hip ORIF on 01/26/2020 with [name].  She developed significant respiratory distress post-operatively, and has ongoing requirement for heated HFNC at rates of 40-50 L/min.  Pulmonology consulted.          Assessment & Plan   Principal Problem:   Acute respiratory failure with hypoxia and hypercapnia (HCC) Active Problems:   AKI (acute kidney injury) (HCC)   Chronic obstructive pulmonary disease with acute exacerbation (HCC)   Non-STEMI (non-ST elevated myocardial infarction) (HCC)   Hypokalemia   Hip fracture, unspecified laterality, closed, initial encounter (HCC)   Goals of care, counseling/discussion   Palliative care by specialist   Nonspeaking deaf   Nondisplaced fracture of base of neck of right femur, initial encounter for closed fracture (HCC)   Acute respiratory failure with hypoxia and hypercapnia -present on admission, improved, recurred post-operatively.  Due to acute heart failure in the setting of non-STEMI with acute  pulmonary edema and renal failure, underlying COPD/emphysema.   PE was ruled out by CTA chest, however bone marrow embolism s/p surgery is in the differential (would be supportive care). Expect prolonged recovery from PNA given severity of her underlying COPD/emphysema. --Continue supplemental oxygen as needed, maintain O2 sat greater than 88%, wean as tolerated --Does not tolerate BiPAP --Continue HFNC --Pulmonology consulted given persistent high O2 needs, appreciate their assistance   Community-acquired pneumonia - present on admission, RLL, suspect aspiration.  Initially respiratory failure attributed to cardiac issues so antibiotics were held.  Patient developed significant leukocytosis, and repeat chest xray on 9/7 showed stable bibasilar opacities.  Started on Vanc/Cefepime on 9/7, had initially received couple days Zithromax on admission.    --Continue Augmentin   Advanced COPD with acute exacerbation / Bullous emphysema - present on admission with hypercapnic respiratory failure as above.  Was treated with IV steroids, course completed. --Continue DuoNebs 3 times daily, PRN albuterol nebs --Mucinex --Solu-medrol 40 mg IV daily --Pulm is following, added Metanebs   Non-STEMI -present on admission, troponin peaked over 4k and downtrended.  Treating medically, heparin drip completed on 9/3. Acute diastolic CHF -likely secondary to non-STEMI.  Echo on 9/1 showed EF 60 to 65%, grade 1 diastolic dysfunction, no wall motion abnormalities. 9/9: BNP significantly higher than previous, with continued high oxygen requirement.   Net IO Since Admission: -6,475.09 mL [01/31/20 0815] --Cardiology consulted, signed off 9/7 --Follow-up with Dr. [name] 1 week after discharge --Continue aspirin, Plavix, high intensity statin, Imdur --Cardizem CD was reduced to 180 mg --Metoprolol  succinate 25 mg daily was added --Consider addition of ACE inhibitor at discharge if renal function and blood pressure  tolerate --Off IV Lasix, monitor volume status --resumed home PO Lasix 20 mg daily --Continue midodrine for BP support --maintain MAP greater than 65 --strict I/O's and daily weights   Elevated D-dimer - had CTA chest on 9/1 which ruled out PE on admission, is getting Lovenox for VTE prophylaxis.   --9/9 lower extremity venous doppler U/S - negative for DVT bilaterally --Consider repeating CTA chest vs V/Q scan if oxygen requirements not improving.     Acute Kidney Injury - POA, resolved.  Monitor BMP daily.     Right hip fracture -present on admission, sustained during fall at ALF.  Underwent ORIF 01/26/2020 with [name]. --Work with PT --Weightbearing as tolerated --Per Ortho, okay to resume anticoagulation - on ASA/Plavix --Follow up with [name] office, the week of September 13 for staple removal, Call office to confirm appt [phone]   Generalized weakness - PT and OT are following, recommending SNF for rehab upon discharge.  GERD -continue oral Protonix daily  Depression/anxiety -continue Lexapro  Osteoporosis -takes Fosamax weekly.  Continue calcium and vitamin D supplements  History of iron deficiency -continue oral iron supplement BID with meals  Allergic rhinitis -continue loratadine   Patient BMI: Body mass index is 27.12 kg/m.   DVT prophylaxis: enoxaparin (LOVENOX) injection 40 mg Start: 01/27/20 2200 SCDs Start: 01/26/20 2057   Diet:  Diet Orders (From admission, onward)    Start     Ordered   01/26/20 2057  Diet regular Room service appropriate? Yes; Fluid consistency: Thin  Diet effective now       Question Answer Comment  Room service appropriate? Yes   Fluid consistency: Thin      01/26/20 2056            Code Status: DNR    Subjective 01/31/20    Patient seen at bedside.  No acute events reported.  Written communication again used.  Patient says she is feeling better.  Cough little better today.  Does reports some chills, being  cold at times.  Denies feeling short of breath today.  Says she is eating and drinking okay.  Says she wants to walk today.   Disposition Plan & Communication   Status is: Inpatient  Remains inpatient appropriate because:IV treatments appropriate due to intensity of illness or inability to take PO.  Weaning down oxygen.   Dispo: The patient is from: ALF              Anticipated d/c is to: SNF              Anticipated d/c date is: 2-3 days              Patient currently is not medically stable to d/c.    Family Communication: none at bedside, will attempt to call. Patient's legal guardian is Ms. Theodosia Blender [phone]   Consults, Procedures, Significant Events   Consultants:   Cardiology  Palliative care  Orthopedics  Procedures:   R hip ORIF on 01/26/20  Antimicrobials:   Augmentin 9/10 >>  Vancomycin 9/7 >> 9/10  Cefepime 9/7 >> 9/10  Azithromycin 9/1 >> 9/3   Objective   Vitals:   01/30/20 1928 01/30/20 1954 01/31/20 0411 01/31/20 0728  BP: 116/67  128/61   Pulse: 85  78   Resp: 17  16   Temp: 97.6 F (36.4 C)  98.1  F (36.7 C)   TempSrc:      SpO2: 97% 91% 97% 100%  Weight:      Height:        Intake/Output Summary (Last 24 hours) at 01/31/2020 0815 Last data filed at 01/31/2020 1610 Gross per 24 hour  Intake 490 ml  Output 1425 ml  Net -935 ml   Filed Weights   01/24/20 0538 01/25/20 0500 01/28/20 0313  Weight: 68.5 kg 73.9 kg 76.2 kg    Physical Exam:  General exam: awake, alert, no acute distress, nonverbal Respiratory system: diminished bases, on heated HFNC at 30 L/min, normal respiratory effort. Cardiovascular system: normal S1/S2, RRR, no pedal edema.   Gastrointestinal system: soft, NT, ND, +bowel sounds. Extremities: moves all, no cyanosis, normal tone, left hip dressing intact Psychiatry: normal mood, congruent affect  Labs   Data Reviewed: I have personally reviewed following labs and imaging studies  CBC: Recent Labs   Lab 01/27/20 0503 01/28/20 0422 01/29/20 0622 01/30/20 0421 01/31/20 0337  WBC 32.3* 25.8* 25.2* 20.4* 21.2*  NEUTROABS  --  23.7* 23.2* 18.1*  --   HGB 9.7* 8.5* 8.8* 8.0* 8.0*  HCT 28.3* 25.9* 26.3* 23.8* 23.8*  MCV 91.0 92.2 92.3 91.2 89.1  PLT 361 263 253 240 246   Basic Metabolic Panel: Recent Labs  Lab 01/27/20 0503 01/28/20 0422 01/29/20 0622 01/30/20 0421 01/31/20 0337  NA 133* 134* 134* 133* 134*  K 4.1 3.0* 3.8 3.7 3.2*  CL 93* 94* 92* 92* 91*  CO2 30 28 31  32 32  GLUCOSE 146* 121* 126* 114* 102*  BUN 43* 40* 37* 38* 33*  CREATININE 1.14* 0.89 0.86 0.88 0.80  CALCIUM 8.1* 7.8* 8.2* 7.9* 8.2*  MG  --   --  2.8*  --   --    GFR: Estimated Creatinine Clearance: 59.5 mL/min (by C-G formula based on SCr of 0.8 mg/dL). Liver Function Tests: No results for input(s): AST, ALT, ALKPHOS, BILITOT, PROT, ALBUMIN in the last 168 hours. No results for input(s): LIPASE, AMYLASE in the last 168 hours. No results for input(s): AMMONIA in the last 168 hours. Coagulation Profile: No results for input(s): INR, PROTIME in the last 168 hours. Cardiac Enzymes: No results for input(s): CKTOTAL, CKMB, CKMBINDEX, TROPONINI in the last 168 hours. BNP (last 3 results) No results for input(s): PROBNP in the last 8760 hours. HbA1C: No results for input(s): HGBA1C in the last 72 hours. CBG: Recent Labs  Lab 01/30/20 0605  GLUCAP 112*   Lipid Profile: No results for input(s): CHOL, HDL, LDLCALC, TRIG, CHOLHDL, LDLDIRECT in the last 72 hours. Thyroid Function Tests: No results for input(s): TSH, T4TOTAL, FREET4, T3FREE, THYROIDAB in the last 72 hours. Anemia Panel: No results for input(s): VITAMINB12, FOLATE, FERRITIN, TIBC, IRON, RETICCTPCT in the last 72 hours. Sepsis Labs: Recent Labs  Lab 01/25/20 0858 01/27/20 0957 01/27/20 1247 01/28/20 0909 01/29/20 1029 01/30/20 0421  PROCALCITON <0.10 0.29  --   --  0.21 0.17  LATICACIDVEN 1.0 2.3* 2.0* 1.7  --   --      Recent Results (from the past 240 hour(s))  MRSA PCR Screening     Status: Abnormal   Collection Time: 01/22/20  5:15 PM   Specimen: Nasopharyngeal  Result Value Ref Range Status   MRSA by PCR POSITIVE (A) NEGATIVE Final    Comment:        The GeneXpert MRSA Assay (FDA approved for NASAL specimens only), is one component of a comprehensive MRSA colonization  surveillance program. It is not intended to diagnose MRSA infection nor to guide or monitor treatment for MRSA infections. RESULT CALLED TO, READ BACK BY AND VERIFIED WITH: BEBRA EMBDEN 01/22/20 AT 2211 BY ACR Performed at [facility], [address] [place], [place] [address]   CULTURE, BLOOD (ROUTINE X 2) w Reflex to ID Panel     Status: None   Collection Time: 01/26/20  9:17 AM   Specimen: BLOOD  Result Value Ref Range Status   Specimen Description BLOOD LEFT ANTECUBITAL  Final   Special Requests   Final    BOTTLES DRAWN AEROBIC AND ANAEROBIC Blood Culture adequate volume   Culture   Final    NO GROWTH 5 DAYS Performed at [facility], [address] [place], [place] [address]    Report Status 01/31/2020 FINAL  Final  CULTURE, BLOOD (ROUTINE X 2) w Reflex to ID Panel     Status: None   Collection Time: 01/26/20  9:22 AM   Specimen: BLOOD  Result Value Ref Range Status   Specimen Description BLOOD RIGHT ANTECUBITAL  Final   Special Requests   Final    BOTTLES DRAWN AEROBIC AND ANAEROBIC Blood Culture adequate volume   Culture   Final    NO GROWTH 5 DAYS Performed at [facility], [address] [place], [place] [address]    Report Status 01/31/2020 FINAL  Final  Urine Culture     Status: Abnormal   Collection Time: 01/27/20  1:14 PM   Specimen: Urine, Random  Result Value Ref Range Status   Specimen Description   Final    URINE, RANDOM Performed at [facility], [address] [place], [place] [address]    Special Requests   Final    NONE Performed at [facility], [address] [place], [place] [address]    Culture 80,000 COLONIES/mL ENTEROCOCCUS RAFFINOSUS (A)  Final   Report Status 01/29/2020 FINAL  Final   Organism ID, Bacteria ENTEROCOCCUS RAFFINOSUS (A)  Final      Susceptibility   Enterococcus raffinosus - MIC*    AMPICILLIN 16 RESISTANT Resistant     NITROFURANTOIN 32 SENSITIVE Sensitive     VANCOMYCIN <=0.5 SENSITIVE Sensitive     * 80,000 COLONIES/mL ENTEROCOCCUS RAFFINOSUS  MRSA PCR Screening     Status: None   Collection Time: 01/30/20 10:08 AM   Specimen: Nasal Mucosa; Nasopharyngeal  Result Value Ref Range Status   MRSA by PCR NEGATIVE NEGATIVE Final    Comment:        The GeneXpert MRSA Assay (FDA approved for NASAL specimens only), is one component of a comprehensive MRSA colonization surveillance program. It is not intended to diagnose MRSA infection nor to guide or monitor treatment for MRSA infections. Performed at [facility], [address] [place], [place] [address]       Imaging Studies   US Venous Img Lower Bilateral (DVT)  Result Date: 01/29/2020 CLINICAL DATA:  Hypoxia.  Elevated D-dimer. EXAM: BILATERAL LOWER EXTREMITY VENOUS DOPPLER ULTRASOUND TECHNIQUE: Gray-scale sonography with compression, as well as color and duplex ultrasound, were performed to evaluate the deep venous system(s) from the level of the common femoral vein through the popliteal and proximal calf veins. COMPARISON:  None. FINDINGS: VENOUS Normal compressibility of the common femoral, superficial femoral, and popliteal veins, as well as the visualized calf veins. Visualized portions of profunda femoral vein and great saphenous vein unremarkable. No filling defects to suggest DVT on grayscale  or color Doppler imaging. Doppler waveforms show normal direction of venous flow, normal respiratory plasticity and response to augmentation. OTHER None. Limitations: none IMPRESSION: Negative. Electronically Signed   By:  Katherine Mantle M.D.   On: 01/29/2020 18:03   DG Chest Port 1 View  Result Date: 01/30/2020 CLINICAL DATA:  Shortness of breath, hypoxemic respiratory failure EXAM: PORTABLE CHEST 1 VIEW COMPARISON:  Multiple prior chest x-rays. FINDINGS: Image rotated to the RIGHT. Accounting for this cardiomediastinal contours are stable as well as hilar structures. Severe bullous changes again noted in the RIGHT upper chest. Background interstitial and airspace opacities are stable and favored to represent multifocal pneumonia. No pleural effusion. Posttraumatic changes about the bilateral chest are again noted with multiple healed rib fractures and signs of previous LEFT thoracotomy. IMPRESSION: No significant change in diffuse interstitial and airspace opacities favored to represent multifocal pneumonia in this patient with marked emphysema and severe bullous changes in the RIGHT upper lobe in particular. Electronically Signed   By: Donzetta Kohut M.D.   On: 01/30/2020 08:39   DG Chest Port 1 View  Result Date: 01/29/2020 CLINICAL DATA:  79 year old female with history of hypoxia today. EXAM: PORTABLE CHEST 1 VIEW COMPARISON:  Chest x-ray 01/27/2020. FINDINGS: Severe emphysematous changes with extensive bullous disease in the right upper lobe. Widespread areas of interstitial prominence an ill-defined airspace disease noted throughout the mid to lower lungs bilaterally. No definite pleural effusions. No pneumothorax. No definite evidence of pulmonary edema. Mild cardiomegaly. Upper mediastinal contours are distorted by patient positioning. Numerous old healed bilateral rib fractures. IMPRESSION: 1. The appearance the chest is very similar to the prior study, again favored to reflect multilobar pneumonia. 2. Cardiomegaly. 3. Aortic atherosclerosis. 4. Emphysema. Electronically Signed   By: Trudie Reed M.D.   On: 01/29/2020 09:57     Medications   Scheduled Meds:  acidophilus  1 capsule Oral TID   alum &  mag hydroxide-simeth  30 mL Oral TID AC & HS   amoxicillin-clavulanate  1 tablet Oral Q12H   aspirin EC  81 mg Oral Daily   atorvastatin  80 mg Oral Daily   calcium-vitamin D  1 tablet Oral BID   cholecalciferol  5,000 Units Oral Daily   clopidogrel  75 mg Oral Daily   diltiazem  180 mg Oral Daily   docusate sodium  100 mg Oral BID   enoxaparin (LOVENOX) injection  40 mg Subcutaneous Q24H   escitalopram  10 mg Oral Daily   feeding supplement (ENSURE ENLIVE)  237 mL Oral TID BM   ferrous sulfate  325 mg Oral BID WC   guaiFENesin  600 mg Oral Daily   ipratropium-albuterol  3 mL Nebulization TID   isosorbide mononitrate  30 mg Oral Daily   loratadine  10 mg Oral Daily   methylPREDNISolone (SOLU-MEDROL) injection  40 mg Intravenous Daily   metoprolol succinate  25 mg Oral Daily   midodrine  5 mg Oral TID WC   pantoprazole  40 mg Oral Daily   potassium chloride SA  40 mEq Oral Daily   Continuous Infusions:      LOS: 10 days    Time spent: 25 minutes with > 50% spent in coordination of care and direct patient contact.    Pennie Banter, DO [facility]  01/31/2020, 8:15 AM    If 7PM-7AM, please contact night-coverage. How to contact the [facility] Attending or Consulting provider 7A - 7P or covering provider during after hours 7P -7A, for  this patient?    1. Check the care team in [facility] and look for a) attending/consulting [facility] provider listed and b) the [facility] team listed 2. Log into [email or web address] and use [facility]'s universal password to access. If you do not have the password, please contact the hospital operator. 3. Locate the [facility] provider you are looking for under [facility] and page to a number that you can be directly reached. 4. If you still have difficulty reaching the provider, please page the [facility] (Director on Call) for the Hospitalists listed on amion for assistance.

## 2020-01-31 NOTE — Progress Notes (Addendum)
Subjective:  Exam was performed via written questions and answers. Patient says her pain is good.   Objective:   VITALS:   Vitals:   01/31/20 0728 01/31/20 0816 01/31/20 0842 01/31/20 1105  BP:  (!) 124/51 116/60 (!) 117/55  Pulse:  80 84 88  Resp:    19  Temp:  98.4 F (36.9 C) 98.1 F (36.7 C) 98.2 F (36.8 C)  TempSrc:  Axillary Oral Oral  SpO2: 100% 91% (!) 83% 96%  Weight:      Height:        PHYSICAL EXAM:  Neurologically intact ABD soft Neurovascular intact Sensation intact distally Intact pulses distally Dorsiflexion/Plantar flexion intact Incision: scant drainage No cellulitis present Compartment soft dressing changed Dressing stable from yesterday's marking.    LABS  Results for orders placed or performed during the hospital encounter of 01/21/20 (from the past 24 hour(s))  Basic metabolic panel     Status: Abnormal   Collection Time: 01/31/20  3:37 AM  Result Value Ref Range   Sodium 134 (L) 135 - 145 mmol/L   Potassium 3.2 (L) 3.5 - 5.1 mmol/L   Chloride 91 (L) 98 - 111 mmol/L   CO2 32 22 - 32 mmol/L   Glucose, Bld 102 (H) 70 - 99 mg/dL   BUN 33 (H) 8 - 23 mg/dL   Creatinine, Ser 3.66 0.44 - 1.00 mg/dL   Calcium 8.2 (L) 8.9 - 10.3 mg/dL   GFR calc non Af Amer >60 >60 mL/min   GFR calc Af Amer >60 >60 mL/min   Anion gap 11 5 - 15  CBC     Status: Abnormal   Collection Time: 01/31/20  3:37 AM  Result Value Ref Range   WBC 21.2 (H) 4.0 - 10.5 K/uL   RBC 2.67 (L) 3.87 - 5.11 MIL/uL   Hemoglobin 8.0 (L) 12.0 - 15.0 g/dL   HCT 44.0 (L) 36 - 46 %   MCV 89.1 80.0 - 100.0 fL   MCH 30.0 26.0 - 34.0 pg   MCHC 33.6 30.0 - 36.0 g/dL   RDW 34.7 42.5 - 95.6 %   Platelets 246 150 - 400 K/uL   nRBC 0.0 0.0 - 0.2 %    US Venous Img Lower Bilateral (DVT)  Result Date: 01/29/2020 CLINICAL DATA:  Hypoxia.  Elevated D-dimer. EXAM: BILATERAL LOWER EXTREMITY VENOUS DOPPLER ULTRASOUND TECHNIQUE: Gray-scale sonography with compression, as well as color and  duplex ultrasound, were performed to evaluate the deep venous system(s) from the level of the common femoral vein through the popliteal and proximal calf veins. COMPARISON:  None. FINDINGS: VENOUS Normal compressibility of the common femoral, superficial femoral, and popliteal veins, as well as the visualized calf veins. Visualized portions of profunda femoral vein and great saphenous vein unremarkable. No filling defects to suggest DVT on grayscale or color Doppler imaging. Doppler waveforms show normal direction of venous flow, normal respiratory plasticity and response to augmentation. OTHER None. Limitations: none IMPRESSION: Negative. Electronically Signed   By: Katherine Mantle M.D.   On: 01/29/2020 18:03   DG Chest Port 1 View  Result Date: 01/30/2020 CLINICAL DATA:  Shortness of breath, hypoxemic respiratory failure EXAM: PORTABLE CHEST 1 VIEW COMPARISON:  Multiple prior chest x-rays. FINDINGS: Image rotated to the RIGHT. Accounting for this cardiomediastinal contours are stable as well as hilar structures. Severe bullous changes again noted in the RIGHT upper chest. Background interstitial and airspace opacities are stable and favored to represent multifocal pneumonia. No pleural effusion. Posttraumatic  changes about the bilateral chest are again noted with multiple healed rib fractures and signs of previous LEFT thoracotomy. IMPRESSION: No significant change in diffuse interstitial and airspace opacities favored to represent multifocal pneumonia in this patient with marked emphysema and severe bullous changes in the RIGHT upper lobe in particular. Electronically Signed   By: Donzetta Kohut M.D.   On: 01/30/2020 08:39    Assessment/Plan: POD5 R CMN  Principal Problem:   Acute respiratory failure with hypoxia and hypercapnia (HCC) Active Problems:   Hypokalemia   AKI (acute kidney injury) (HCC)   Nonspeaking deaf   Chronic obstructive pulmonary disease with acute exacerbation (HCC)    Non-STEMI (non-ST elevated myocardial infarction) (HCC)   Hip fracture, unspecified laterality, closed, initial encounter (HCC)   Goals of care, counseling/discussion   Palliative care by specialist   Nondisplaced fracture of base of neck of right femur, initial encounter for closed fracture (HCC)   Up with therapy , WBAT when medically stable OK to restart anticoagulation from ortho standpoint Discharge per medicine Follow up with [name] office, the week of September 13 for staple removal, Call office to confirm appt [phone] H/H holding steady    Karleen Hampshire , MD 01/31/2020, 11:43 AM

## 2020-01-31 NOTE — Progress Notes (Signed)
PT Cancellation Note  Patient Details Name: Alice Wallace MRN: [id number] DOB: April 27, 1941   Cancelled Treatment:     PT attempt. Upon entering room, pt was sitting at 84% on 4 L HFNC. Increased to 5 L and pt rebounds to low 90s. She reports 10/10 pain and was unwilling to participate. Pt visibly frustrated with inability to tolerate PT session. Will return at later time/date and continue to follow per POC.   Rushie Chestnut 01/31/2020, 2:48 PM

## 2020-02-01 DIAGNOSIS — L89600 Pressure ulcer of unspecified heel, unstageable: Secondary | ICD-10-CM | POA: Insufficient documentation

## 2020-02-01 LAB — BASIC METABOLIC PANEL
Anion gap: 8 (ref 5–15)
BUN: 30 mg/dL — ABNORMAL HIGH (ref 8–23)
CO2: 33 mmol/L — ABNORMAL HIGH (ref 22–32)
Calcium: 8.5 mg/dL — ABNORMAL LOW (ref 8.9–10.3)
Chloride: 95 mmol/L — ABNORMAL LOW (ref 98–111)
Creatinine, Ser: 0.69 mg/dL (ref 0.44–1.00)
GFR calc Af Amer: >60 mL/min (ref >60)
GFR calc non Af Amer: >60 mL/min (ref >60)
Glucose, Bld: 98 mg/dL (ref 70–99)
Potassium: 4.2 mmol/L (ref 3.5–5.1)
Sodium: 136 mmol/L (ref 135–145)

## 2020-02-01 MED ORDER — ACETAMINOPHEN 650 MG RE SUPP: 650.0000 mg | Freq: Four times a day (QID) | RECTAL | Status: DC

## 2020-02-01 MED ORDER — METOPROLOL SUCCINATE ER 25 MG PO TB24
12.5000 mg | ORAL_TABLET | Freq: Every day | ORAL | Status: DC
  Administered 2020-02-02 – 2020-02-03 (×2): 12.5 mg via ORAL
  Filled 2020-02-01 (×2): qty 1

## 2020-02-01 MED ORDER — ACETAMINOPHEN 325 MG PO TABS
650.0000 mg | ORAL_TABLET | Freq: Four times a day (QID) | ORAL | Status: DC
  Administered 2020-02-01 – 2020-02-03 (×7): 650 mg via ORAL
  Filled 2020-02-01 (×9): qty 2

## 2020-02-01 MED ORDER — DILTIAZEM HCL ER COATED BEADS 120 MG PO CP24
120.0000 mg | ORAL_CAPSULE | Freq: Every day | ORAL | Status: DC
  Administered 2020-02-02 – 2020-02-03 (×2): 120 mg via ORAL
  Filled 2020-02-01 (×2): qty 1

## 2020-02-01 NOTE — Progress Notes (Signed)
Pulmonary Medicine          Date: 02/01/2020,   MRN# [id number] Alice Wallace 08-27-1940     AdmissionWeight: 55.9 kg                 CurrentWeight: 76.2 kg  Referring physician: Dr Denton Lank    CHIEF COMPLAINT:   Acute hypoxemic respiratory failure   HISTORY OF PRESENT ILLNESS and SUBJECTIVE FINDINGS   79 yo F with hx of COPD, HTN, bilateral hearing loss comes from assisted living with respiratory distress after mechanical fall to right hip.  She was noted to be acutely hypoxemic with sPO2<75% on 2L/min [facility].  She was treated with typical COPD care path. She had CTPE which was negative for VTE.  She was found to have acute Right proximal femoral fracture. Post operatively she has increased O2 requirement.  02/01/20- Patient examined at bedside, net fluid bal 6.5L negative, mild decrement in h/h to 8 despite negative fluid balance which is concerning may need transfusion. 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   9/   PAST MEDICAL HISTORY   Past Medical History:  Diagnosis Date  . COPD (chronic obstructive pulmonary disease) (HCC)   . Deaf   . Hypertension   . Osteoporosis      SURGICAL HISTORY   Past Surgical History:  Procedure Laterality Date  . INTRAMEDULLARY (IM) NAIL INTERTROCHANTERIC Right 01/26/2020   Procedure: Right Hip IM nail with TFNA;  Surgeon: Lyndle Herrlich, MD;  Location: [facility] ORS;  Service: Orthopedics;  Laterality: Right;     FAMILY HISTORY   History reviewed. No pertinent family history.   SOCIAL HISTORY   Social History   Tobacco Use  . Smoking status: Never Smoker  . Smokeless tobacco: Never Used  Substance Use Topics  . Alcohol use: Not Currently  . Drug use: Not Currently     MEDICATIONS    Home Medication:    Current Medication:  Current Facility-Administered Medications:  .   acetaminophen (TYLENOL) tablet 650 mg, 650 mg, Oral, Q6H **OR** acetaminophen (TYLENOL) suppository 650 mg, 650 mg, Rectal, Q6H, Griffith, Kelly A, DO .  acidophilus (RISAQUAD) capsule 1 capsule, 1 capsule, Oral, TID, Shahmehdi, Seyed A, MD, 1 capsule at 02/01/20 0845 .  albuterol (PROVENTIL) (2.5 MG/3ML) 0.083% nebulizer solution 3 mL, 3 mL, Inhalation, Q4H PRN, Lyndle Herrlich, MD .  alum & mag hydroxide-simeth (MAALOX/MYLANTA) 200-200-20 MG/5ML suspension 30 mL, 30 mL, Oral, TID AC & HS, Lyndle Herrlich, MD, 30 mL at 02/01/20 0844 .  amoxicillin-clavulanate (AUGMENTIN) 875-125 MG per tablet 1 tablet, 1 tablet, Oral, Q12H, Pennie Banter, DO, 1 tablet at 02/01/20 0844 .  aspirin EC tablet 81 mg, 81 mg, Oral, Daily, Lyndle Herrlich, MD, 81 mg at 02/01/20 0844 .  atorvastatin (LIPITOR) tablet 80 mg, 80 mg, Oral, Daily, Lyndle Herrlich, MD, 80 mg at 02/01/20 0844 .  calcium-vitamin D (OSCAL WITH D) 500-200 MG-UNIT per tablet 1 tablet, 1 tablet, Oral, BID, Lyndle Herrlich, MD, 1 tablet at 02/01/20 0844 .  cholecalciferol (VITAMIN D3) tablet 5,000 Units, 5,000 Units, Oral, Daily, Lyndle Herrlich, MD, 5,000 Units at 02/01/20 0845 .  clopidogrel (PLAVIX) tablet 75 mg, 75 mg, Oral, Daily, Leanora Ivanoff, PA-C, 75 mg at 02/01/20 0844 .  dextromethorphan (DELSYM) 30 MG/5ML liquid 30 mg, 30 mg, Oral, QHS PRN, Lyndle Herrlich, MD, 30 mg at 01/25/20  5784 .  diltiazem (CARDIZEM CD) 24 hr capsule 180 mg, 180 mg, Oral, Daily, Leanora Ivanoff, PA-C, 180 mg at 02/01/20 0845 .  diphenhydrAMINE (BENADRYL) capsule 25 mg, 25 mg, Oral, Q6H PRN, Lyndle Herrlich, MD .  docusate sodium (COLACE) capsule 100 mg, 100 mg, Oral, BID, Lyndle Herrlich, MD, 100 mg at 02/01/20 0845 .  enoxaparin (LOVENOX) injection 40 mg, 40 mg, Subcutaneous, Q24H, Shahmehdi, Seyed A, MD, 40 mg at 01/31/20 2127 .  escitalopram (LEXAPRO) tablet 10 mg, 10 mg, Oral, Daily, Lyndle Herrlich, MD, 10 mg at 02/01/20 0859 .  feeding supplement (ENSURE ENLIVE)  (ENSURE ENLIVE) liquid 237 mL, 237 mL, Oral, TID BM, Lyndle Herrlich, MD, 237 mL at 01/31/20 2025 .  ferrous sulfate tablet 325 mg, 325 mg, Oral, BID WC, Lyndle Herrlich, MD, 325 mg at 02/01/20 0844 .  furosemide (LASIX) tablet 20 mg, 20 mg, Oral, Daily, Esaw Grandchild A, DO, 20 mg at 02/01/20 0845 .  guaiFENesin (MUCINEX) 12 hr tablet 600 mg, 600 mg, Oral, Daily, Lyndle Herrlich, MD, 600 mg at 02/01/20 0845 .  HYDROmorphone (DILAUDID) injection 1 mg, 1 mg, Intravenous, Q3H PRN, Lyndle Herrlich, MD, 1 mg at 01/31/20 1457 .  ipratropium-albuterol (DUONEB) 0.5-2.5 (3) MG/3ML nebulizer solution 3 mL, 3 mL, Nebulization, TID, Lyndle Herrlich, MD, 3 mL at 02/01/20 0731 .  isosorbide mononitrate (IMDUR) 24 hr tablet 30 mg, 30 mg, Oral, Daily, Lyndle Herrlich, MD, 30 mg at 02/01/20 0844 .  loperamide (IMODIUM) capsule 2 mg, 2 mg, Oral, Daily PRN, Lyndle Herrlich, MD .  loratadine (CLARITIN) tablet 10 mg, 10 mg, Oral, Daily, Lyndle Herrlich, MD, 10 mg at 02/01/20 0845 .  magnesium hydroxide (MILK OF MAGNESIA) suspension 30 mL, 30 mL, Oral, Daily PRN, Lyndle Herrlich, MD .  methylPREDNISolone sodium succinate (SOLU-MEDROL) 40 mg/mL injection 40 mg, 40 mg, Intravenous, Daily, Vida Rigger, MD, 40 mg at 02/01/20 0859 .  metoCLOPramide (REGLAN) tablet 5-10 mg, 5-10 mg, Oral, Q8H PRN **OR** metoCLOPramide (REGLAN) injection 5-10 mg, 5-10 mg, Intravenous, Q8H PRN, Lyndle Herrlich, MD .  metoprolol succinate (TOPROL-XL) 24 hr tablet 25 mg, 25 mg, Oral, Daily, Leanora Ivanoff, PA-C, 25 mg at 02/01/20 0845 .  midodrine (PROAMATINE) tablet 5 mg, 5 mg, Oral, TID WC, Shahmehdi, Seyed A, MD, 5 mg at 02/01/20 0844 .  morphine 2 MG/ML injection 2 mg, 2 mg, Intravenous, Q4H PRN, Esaw Grandchild A, DO, 2 mg at 01/30/20 1457 .  nitroGLYCERIN (NITROSTAT) SL tablet 0.4 mg, 0.4 mg, Sublingual, Q5 min PRN, Lyndle Herrlich, MD, 0.4 mg at 01/24/20 0920 .  ondansetron (ZOFRAN) tablet 4 mg, 4 mg, Oral, Q6H PRN **OR** ondansetron  (ZOFRAN) injection 4 mg, 4 mg, Intravenous, Q6H PRN, Lyndle Herrlich, MD, 4 mg at 01/26/20 2158 .  ondansetron (ZOFRAN) tablet 4 mg, 4 mg, Oral, Q6H PRN **OR** ondansetron (ZOFRAN) injection 4 mg, 4 mg, Intravenous, Q6H PRN, Lyndle Herrlich, MD .  pantoprazole (PROTONIX) EC tablet 40 mg, 40 mg, Oral, Daily, Lyndle Herrlich, MD, 40 mg at 02/01/20 0844 .  potassium chloride SA (KLOR-CON) CR tablet 40 mEq, 40 mEq, Oral, Daily, Lyndle Herrlich, MD, 40 mEq at 02/01/20 0844 .  tiZANidine (ZANAFLEX) tablet 4 mg, 4 mg, Oral, Q8H PRN, Lyndle Herrlich, MD, 4 mg at 01/24/20 6962 .  traZODone (DESYREL) tablet 25 mg, 25 mg, Oral, QHS PRN, Lyndle Herrlich, MD    ALLERGIES   Hydrocodone, Naproxen, Other,  Sulfamethoxazole-trimethoprim, and Tramadol     REVIEW OF SYSTEMS    Review of Systems:  Gen:  Denies  fever, sweats, chills weigh loss  HEENT: Denies blurred vision, double vision, ear pain, eye pain, hearing loss, nose bleeds, sore throat Cardiac:  No dizziness, chest pain or heaviness, chest tightness,edema Resp:   Denies cough or sputum porduction, shortness of breath,wheezing, hemoptysis,  Gi: Denies swallowing difficulty, stomach pain, nausea or vomiting, diarrhea, constipation, bowel incontinence Gu:  Denies bladder incontinence, burning urine Ext:   Denies Joint pain, stiffness or swelling Skin: Denies  skin rash, easy bruising or bleeding or hives Endoc:  Denies polyuria, polydipsia , polyphagia or weight change Psych:   Denies depression, insomnia or hallucinations   Other:  All other systems negative   VS: BP (!) 110/48 (BP Location: Left Arm)   Pulse 81   Temp 97.8 F (36.6 C) (Oral)   Resp 20   Ht 5\' 6"  (1.676 m)   Wt 76.2 kg   SpO2 90%   BMI 27.12 kg/m      PHYSICAL EXAM    GENERAL:NAD, no fevers, chills, no weakness no fatigue HEAD: Normocephalic, atraumatic.  EYES: Pupils equal, round, reactive to light. Extraocular muscles intact. No scleral icterus.  MOUTH:  Moist mucosal membrane. Dentition intact. No abscess noted.  EAR, NOSE, THROAT: Clear without exudates. No external lesions.  NECK: Supple. No thyromegaly. No nodules. No JVD.  PULMONARY: mild rhonchi worse on right lower lung zone CARDIOVASCULAR: S1 and S2. Regular rate and rhythm. No murmurs, rubs, or gallops. No edema. Pedal pulses 2+ bilaterally.  GASTROINTESTINAL: Soft, nontender, nondistended. No masses. Positive bowel sounds. No hepatosplenomegaly.  MUSCULOSKELETAL: No swelling, clubbing, or edema. Range of motion full in all extremities.  NEUROLOGIC: Cranial nerves II through XII are intact. No gross focal neurological deficits. Sensation intact. Reflexes intact.  SKIN: No ulceration, lesions, rashes, or cyanosis. Skin warm and dry. Turgor intact.  PSYCHIATRIC: Mood, affect within normal limits. The patient is awake, alert and oriented x 3. Insight, judgment intact.       IMAGING    DG Chest 2 View  Result Date: 01/27/2020 CLINICAL DATA:  Shortness of breath. EXAM: CHEST - 2 VIEW COMPARISON:  January 26, 2020. FINDINGS: Stable cardiomegaly. Stable old bilateral rib fractures. Severe emphysematous disease is noted in both lungs, right greater than left. No definite pneumothorax is noted. No definite pleural effusion is noted. Stable bibasilar opacities are noted which may represent pneumonia or possibly edema. Small bilateral pleural effusions may be present. IMPRESSION: Stable bibasilar opacities are noted which may represent pneumonia or possibly edema. Stable severe emphysematous disease is noted in both lungs, right greater than left. Small bilateral pleural effusions may be present. Aortic Atherosclerosis (ICD10-I70.0) and Emphysema (ICD10-J43.9). Electronically Signed   By: Lupita RaiderJames  Green Jr M.D.   On: 01/27/2020 13:55   CT Angio Chest PE W/Cm &/Or Wo Cm  Result Date: 01/21/2020 CLINICAL DATA:  Fall and shortness of breath. EXAM: CT ANGIOGRAPHY CHEST WITH CONTRAST TECHNIQUE:  Multidetector CT imaging of the chest was performed using the standard protocol during bolus administration of intravenous contrast. Multiplanar CT image reconstructions and MIPs were obtained to evaluate the vascular anatomy. CONTRAST:  75mL OMNIPAQUE IOHEXOL 350 MG/ML SOLN COMPARISON:  06/09/2019 FINDINGS: Cardiovascular: Prominent right ventricular size. No pericardial effusion. Aortic and great vessel atherosclerotic calcification. No pulmonary artery filling defect is seen. There is significant motion artifact at the lung bases. Mediastinum/Nodes: Small to moderate sliding hiatal hernia. No adenopathy.  Lungs/Pleura: Opacity in the right lower lobe with volume loss. No edema, effusion, or pneumothorax. Panlobular emphysema. Dystrophic calcifications in the apical lungs. Bronchomalacia. Upper Abdomen: Atherosclerotic calcification. Musculoskeletal: Healing anterior and lateral left rib fractures. Review of the MIP images confirms the above findings. IMPRESSION: 1. Right lower lobe atelectasis versus pneumonia. 2. Moderately motion degraded chest CTA. No evidence of pulmonary embolism. 3. Advanced emphysema. Aortic Atherosclerosis (ICD10-I70.0) and Emphysema (ICD10-J43.9). Electronically Signed   By: Marnee Spring M.D.   On: 01/21/2020 05:55   CT Hip Right Wo Contrast  Result Date: 01/21/2020 CLINICAL DATA:  Hip trauma.  Fracture suspected. EXAM: CT OF THE RIGHT HIP WITHOUT CONTRAST TECHNIQUE: Multidetector CT imaging of the right hip was performed according to the standard protocol. Multiplanar CT image reconstructions were also generated. COMPARISON:  Hip x-rays from earlier same day. FINDINGS: Bones/Joint/Cartilage Linear lucency identified in the lateral cortex of the proximal right femoral diaphysis associated with focal cortical thickening. Imaging features compatible with nondisplaced stress fracture. The abnormality is included on the extreme inferior aspect of this study and is best visualized on  sagittal reconstruction image 21 of series 5. Degenerative changes are noted in the right hip. Ligaments Suboptimally assessed by CT. Muscles and Tendons No substantial hemorrhage noted in the proximal thigh musculature. Soft tissues Distended urinary bladder is incompletely visualized. IMPRESSION: Linear lucency in the lateral cortex of the proximal right femoral diaphysis associated with focal cortical thickening. Imaging features compatible with nondisplaced stress fracture. Consider MRI of the proximal right femur for definitive characterization. Electronically Signed   By: Kennith Center M.D.   On: 01/21/2020 06:02   US Venous Img Lower Bilateral (DVT)  Result Date: 01/29/2020 CLINICAL DATA:  Hypoxia.  Elevated D-dimer. EXAM: BILATERAL LOWER EXTREMITY VENOUS DOPPLER ULTRASOUND TECHNIQUE: Gray-scale sonography with compression, as well as color and duplex ultrasound, were performed to evaluate the deep venous system(s) from the level of the common femoral vein through the popliteal and proximal calf veins. COMPARISON:  None. FINDINGS: VENOUS Normal compressibility of the common femoral, superficial femoral, and popliteal veins, as well as the visualized calf veins. Visualized portions of profunda femoral vein and great saphenous vein unremarkable. No filling defects to suggest DVT on grayscale or color Doppler imaging. Doppler waveforms show normal direction of venous flow, normal respiratory plasticity and response to augmentation. OTHER None. Limitations: none IMPRESSION: Negative. Electronically Signed   By: Katherine Mantle M.D.   On: 01/29/2020 18:03   DG Chest Port 1 View  Result Date: 01/30/2020 CLINICAL DATA:  Shortness of breath, hypoxemic respiratory failure EXAM: PORTABLE CHEST 1 VIEW COMPARISON:  Multiple prior chest x-rays. FINDINGS: Image rotated to the RIGHT. Accounting for this cardiomediastinal contours are stable as well as hilar structures. Severe bullous changes again noted in the  RIGHT upper chest. Background interstitial and airspace opacities are stable and favored to represent multifocal pneumonia. No pleural effusion. Posttraumatic changes about the bilateral chest are again noted with multiple healed rib fractures and signs of previous LEFT thoracotomy. IMPRESSION: No significant change in diffuse interstitial and airspace opacities favored to represent multifocal pneumonia in this patient with marked emphysema and severe bullous changes in the RIGHT upper lobe in particular. Electronically Signed   By: Donzetta Kohut M.D.   On: 01/30/2020 08:39   DG Chest Port 1 View  Result Date: 01/29/2020 CLINICAL DATA:  79 year old female with history of hypoxia today. EXAM: PORTABLE CHEST 1 VIEW COMPARISON:  Chest x-ray 01/27/2020. FINDINGS: Severe emphysematous changes with extensive bullous  disease in the right upper lobe. Widespread areas of interstitial prominence an ill-defined airspace disease noted throughout the mid to lower lungs bilaterally. No definite pleural effusions. No pneumothorax. No definite evidence of pulmonary edema. Mild cardiomegaly. Upper mediastinal contours are distorted by patient positioning. Numerous old healed bilateral rib fractures. IMPRESSION: 1. The appearance the chest is very similar to the prior study, again favored to reflect multilobar pneumonia. 2. Cardiomegaly. 3. Aortic atherosclerosis. 4. Emphysema. Electronically Signed   By: Trudie Reed M.D.   On: 01/29/2020 09:57   DG Chest Port 1 View  Result Date: 01/26/2020 CLINICAL DATA:  Shortness of breath. EXAM: PORTABLE CHEST 1 VIEW COMPARISON:  January 24, 2020 FINDINGS: Stably enlarged cardiac silhouette. Calcific atherosclerotic disease of the aorta. Upper lobe predominant moderate to severe emphysematous changes. Persistent patchy interstitial and alveolar opacities in bilateral lower lobes. No evidence of pneumothorax or pleural effusion. Stable chest wall deformity from prior trauma.  IMPRESSION: 1. Persistent patchy interstitial and alveolar opacities in bilateral lower lobes may represent mixed pattern pulmonary edema or bronchopneumonia. 2. Moderate to severe emphysema. 3. Stably enlarged cardiac silhouette. Electronically Signed   By: Ted Mcalpine M.D.   On: 01/26/2020 09:15   DG Chest Port 1 View  Result Date: 01/24/2020 CLINICAL DATA:  COPD exacerbation. Pt presented 01/21/2020 to the emergency room with acute onset of fall at her assisted living facility with subsequent right hip pain and significant respiratory distress. Hx - COPD, HTN, non-smoker. EXAM: PORTABLE CHEST 1 VIEW COMPARISON:  01/21/2020 FINDINGS: Changes of emphysema skeletal lung scarring and interstitial thickening are stable. Opacity at the right lung base has mildly improved from the previous study. No new lung abnormalities. No convincing pleural effusion and no pneumothorax. Cardiac silhouette is normal in size. Multiple bilateral old rib fractures are stable. IMPRESSION: 1. Mild interval improvement. Decreased opacity at the right lung base consistent with improved pneumonia or atelectasis. 2. No other change. Stable chronic findings including extensive emphysema. Electronically Signed   By: Amie Portland M.D.   On: 01/24/2020 10:43   DG Chest Portable 1 View  Result Date: 01/21/2020 CLINICAL DATA:  Larey Seat, short of breath, hypoxia EXAM: PORTABLE CHEST 1 VIEW COMPARISON:  06/07/2019 FINDINGS: Single frontal view of the chest demonstrates a stable cardiac silhouette. Extensive bullous emphysematous changes are seen, greatest at the right apex. Marked areas of scarring and fibrosis are seen throughout the lungs. Increased density at the right costophrenic angle may reflect consolidation and/or effusion. Chronic posttraumatic changes of the thoracic cage. No acute displaced fractures. IMPRESSION: 1. 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 2. Density at the right lateral lung base  may reflect focal lung consolidation and/or small effusion. Electronically Signed   By: Sharlet Salina M.D.   On: 01/21/2020 03:48   ECHOCARDIOGRAM COMPLETE  Result Date: 01/21/2020    ECHOCARDIOGRAM REPORT   Patient Name:   [facility] Date of Exam: 01/21/2020 Medical Rec #:  [id number]     Height:       66.0 in Accession #:    [id number]    Weight:       123.2 lb Date of Birth:  03/06/1941      BSA:          1.628 m Patient Age:    79 years      BP:           116/63 mmHg Patient Gender: F  HR:           74 bpm. Exam Location:  [facility] Procedure: 2D Echo, Cardiac Doppler and Color Doppler Indications:     NSTEMI 121.4  History:         Patient has no prior history of Echocardiogram examinations.                  COPD; Risk Factors:Hypertension.  Sonographer:     Cristela Blue RDCS (AE) Referring Phys:  [id number] Vernetta Honey MANSY Diagnosing Phys: Debbe Odea MD IMPRESSIONS  1. Left ventricular ejection fraction, by estimation, is 60 to 65%. The left ventricle has normal function. The left ventricle has no regional wall motion abnormalities. Left ventricular diastolic parameters are consistent with Grade I diastolic dysfunction (impaired relaxation).  2. Right ventricular systolic function is normal. The right ventricular size is normal.  3. Left atrial size was moderately dilated.  4. The mitral valve is normal in structure. No evidence of mitral valve regurgitation. No evidence of mitral stenosis.  5. The aortic valve is normal in structure. Aortic valve regurgitation is not visualized. No aortic stenosis is present.  6. The inferior vena cava is normal in size with greater than 50% respiratory variability, suggesting right atrial pressure of 3 mmHg. FINDINGS  Left Ventricle: Left ventricular ejection fraction, by estimation, is 60 to 65%. The left ventricle has normal function. The left ventricle has no regional wall motion abnormalities. The left ventricular internal cavity size was normal in size. There is   no left ventricular hypertrophy. Left ventricular diastolic parameters are consistent with Grade I diastolic dysfunction (impaired relaxation). Right Ventricle: The right ventricular size is normal. No increase in right ventricular wall thickness. Right ventricular systolic function is normal. Left Atrium: Left atrial size was moderately dilated. Right Atrium: Right atrial size was normal in size. Pericardium: There is no evidence of pericardial effusion. Mitral Valve: The mitral valve is normal in structure. Normal mobility of the mitral valve leaflets. No evidence of mitral valve regurgitation. No evidence of mitral valve stenosis. Tricuspid Valve: The tricuspid valve is normal in structure. Tricuspid valve regurgitation is not demonstrated. No evidence of tricuspid stenosis. Aortic Valve: The aortic valve is normal in structure. Aortic valve regurgitation is not visualized. No aortic stenosis is present. Aortic valve mean gradient measures 3.5 mmHg. Aortic valve peak gradient measures 6.5 mmHg. Aortic valve area, by VTI measures 2.46 cm. Pulmonic Valve: The pulmonic valve was not well visualized. Pulmonic valve regurgitation is not visualized. No evidence of pulmonic stenosis. Aorta: The aortic root is normal in size and structure. Venous: The inferior vena cava was not well visualized. The inferior vena cava is normal in size with greater than 50% respiratory variability, suggesting right atrial pressure of 3 mmHg. IAS/Shunts: No atrial level shunt detected by color flow Doppler.  LEFT VENTRICLE PLAX 2D LVIDd:         4.77 cm  Diastology LVIDs:         3.08 cm  LV e' lateral:   7.83 cm/s LV PW:         1.15 cm  LV E/e' lateral: 11.6 LV IVS:        1.11 cm  LV e' medial:    5.87 cm/s LVOT diam:     2.00 cm  LV E/e' medial:  15.5 LV SV:         68 LV SV Index:   42 LVOT Area:     3.14 cm  RIGHT VENTRICLE  RV Basal diam:  3.63 cm RV S prime:     12.40 cm/s TAPSE (M-mode): 3.6 cm LEFT ATRIUM             Index        RIGHT ATRIUM           Index LA diam:        4.80 cm 2.95 cm/m  RA Area:     15.10 cm LA Vol (A2C):   83.7 ml 51.42 ml/m RA Volume:   35.80 ml  21.99 ml/m LA Vol (A4C):   96.5 ml 59.28 ml/m LA Biplane Vol: 91.0 ml 55.90 ml/m  AORTIC VALVE                   PULMONIC VALVE AV Area (Vmax):    2.25 cm    PV Vmax:        0.92 m/s AV Area (Vmean):   2.51 cm    PV Peak grad:   3.4 mmHg AV Area (VTI):     2.46 cm    RVOT Peak grad: 5 mmHg AV Vmax:           127.00 cm/s AV Vmean:          86.450 cm/s AV VTI:            0.277 m AV Peak Grad:      6.5 mmHg AV Mean Grad:      3.5 mmHg LVOT Vmax:         90.90 cm/s LVOT Vmean:        69.000 cm/s LVOT VTI:          0.217 m LVOT/AV VTI ratio: 0.78  AORTA Ao Root diam: 3.50 cm MITRAL VALVE                TRICUSPID VALVE MV Area (PHT): 3.34 cm     TR Peak grad:   30.7 mmHg MV Decel Time: 227 msec     TR Vmax:        277.00 cm/s MV E velocity: 91.20 cm/s MV A velocity: 137.00 cm/s  SHUNTS MV E/A ratio:  0.67         Systemic VTI:  0.22 m                             Systemic Diam: 2.00 cm Debbe Odea MD Electronically signed by Debbe Odea MD Signature Date/Time: 01/21/2020/4:26:00 PM    Final    DG HIP OPERATIVE UNILAT W OR W/O PELVIS RIGHT  Result Date: 01/26/2020 CLINICAL DATA:  Portable operative images for right proximal femur ORIF. EXAM: OPERATIVE RIGHT HIP (WITH PELVIS IF PERFORMED) 5 VIEWS TECHNIQUE: Fluoroscopic spot image(s) were submitted for interpretation post-operatively. COMPARISON:  01/25/2020 FINDINGS: Five spot fluoro graphic operative images show placement of a long medullary rod through the femur supporting a compression screw. The proximal femur fracture components have been reduced, with the distal fracture component minimally displaced medial compared to the proximal component by 6-7 mm. Orthopedic hardware appears well seated and aligned. IMPRESSION: 1. Significant reduction of the proximal right femur fracture following ORIF.  Electronically Signed   By: Amie Portland M.D.   On: 01/26/2020 18:41   DG HIP UNILAT WITH PELVIS 2-3 VIEWS RIGHT  Result Date: 01/25/2020 CLINICAL DATA:  Dislocated hip, right, initial encounter. Worsening right hip pain. EXAM: DG HIP (WITH OR WITHOUT PELVIS) 2-3V RIGHT COMPARISON:  Radiograph and CT  01/21/2020, radiograph 07/04/2019 FINDINGS: The previous incomplete subtrochanteric fracture of the right hip is now a complete fracture, displaced and significantly angulated with apex anterior angulation of 79 degrees. Fracture may be bisphosphonate related with cortical thickening noted on February 2021 exam. The femoral head remains seated in the acetabulum without dislocation. Moderate right hip osteoarthritis. Pubic rami are intact. IMPRESSION: 1. The previous incomplete subtrochanteric fracture is now a complete fracture, displaced with significant angulation. 2. Femoral head is well seated in the acetabulum without dislocation. Electronically Signed   By: Narda Rutherford M.D.   On: 01/25/2020 17:15   DG Hip Unilat W or Wo Pelvis 2-3 Views Right  Result Date: 01/21/2020 CLINICAL DATA:  Larey Seat, pain EXAM: DG HIP (WITH OR WITHOUT PELVIS) 2-3V RIGHT COMPARISON:  01/31/2017 FINDINGS: Frontal view of the pelvis as well as frontal and cross-table lateral views of the right hip are obtained. There is a nondisplaced transverse fracture through the proximal femoral diaphysis. Alignment is anatomic. There are no other acute displaced fractures. The hips are well aligned. Mild bilateral hip osteoarthritis. Sacroiliac joints are normal. IMPRESSION: 1. Nondisplaced transverse fracture through the proximal right femoral diaphysis. Fracture line involves an area of chronic focal cortical thickening, and likely reflects an acute fracture at a site of chronic stress. Electronically Signed   By: Sharlet Salina M.D.   On: 01/21/2020 03:46      ASSESSMENT/PLAN    Acute hypoxemic respiratory failure -s/p mechanical  fall unknown time down - has RLL consolidated infiltrate, unclearly defined due to significant motion artifact as shown in pictorial documentation above.  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  Telemetry monitoring for arrythmia  Advanced COPD with bullous emphysema   - continue typical COPD care path   -continue solumedrol 40 daily    -patient with overall poor prognosis -appreciate Palliative care evaluation   Acute blood loss anemia   - transfuse to goal Hemoglobin >8   Moderate protein calorie malnutrition    - peripheral and bitemporal muscle atrophy    - recommend dietary evaluation by RD - order placed      Thank you for allowing me to participate in the care of this patient.    Patient/Family are satisfied with care plan and all questions have been answered.  This document was prepared using Dragon voice recognition software and may include unintentional dictation errors.     Vida Rigger, M.D.  Division of Pulmonary & Critical Care Medicine  [facility]

## 2020-02-01 NOTE — Progress Notes (Signed)
PROGRESS NOTE    Alice Wallace   NUU:[id number]  DOB: [birth year]  PCP: Devoria Glassing, NP    DOA: 01/21/2020 LOS: 11   Brief Narrative   [facility] assumed care on 01/28/2020, patient out of ICU.  HPI and Hospital Course summarized below:  79 y.o. Caucasian female with a known history of COPD, hypertension, deafness and muteness, (can read lips and written communications) presented to the ED on 01/21/2020 after having a fall at her assisted living facility with subsequent right hip pain and significant respiratory distress and hypoxia with O2 sat in 70's on her usual 2 L/min oxygen.   Treated with Duoneb, albuterol neb and IV Solu-medrol by EMS for presumed COPD exacerbation.    In the ED, tachycardic HR 106, tachypneic RR 23, O2 sat 76% on room air, improved to 93-96% on 6 L/min.  CTA was negative for PE, but showed RLL atelectasis vs pneumonia and advanced emphysema.  Imaging consistent with R proximal femur fracture.   She is status post right hip ORIF on 01/26/2020 with [name].  She developed significant respiratory distress post-operatively, and has ongoing requirement for heated HFNC at rates of 40-50 L/min.  Pulmonology consulted.          Assessment & Plan   Principal Problem:   Acute respiratory failure with hypoxia and hypercapnia (HCC) Active Problems:   AKI (acute kidney injury) (HCC)   Chronic obstructive pulmonary disease with acute exacerbation (HCC)   Non-STEMI (non-ST elevated myocardial infarction) (HCC)   Hypokalemia   Hip fracture, unspecified laterality, closed, initial encounter (HCC)   Goals of care, counseling/discussion   Palliative care by specialist   Nonspeaking deaf   Nondisplaced fracture of base of neck of right femur, initial encounter for closed fracture (HCC)   Pressure injury of skin   Acute respiratory failure with hypoxia and hypercapnia -present on admission, improved, recurred post-operatively.  Due to acute heart failure in the setting of  non-STEMI with acute pulmonary edema and renal failure, underlying COPD/emphysema.   PE was ruled out by CTA chest, however bone marrow embolism s/p surgery is in the differential (would be supportive care). Expect prolonged recovery from PNA given severity of her underlying COPD/emphysema. --Continue supplemental oxygen as needed, maintain O2 sat greater than 88%, wean as tolerated --Does not tolerate BiPAP --Continue nasal cannula, HFNC oxygen as needed --Pulmonology consulted, appreciate their assistance   Community-acquired pneumonia - present on admission, RLL, suspect aspiration.  Initially respiratory failure attributed to cardiac issues so antibiotics were held.  Patient developed significant leukocytosis, and repeat chest xray on 9/7 showed stable bibasilar opacities.  Started on Vanc/Cefepime on 9/7, had initially received couple days Zithromax on admission.    --Continue Augmentin - likely complete 7 day course if she continues to improve   Advanced COPD with acute exacerbation / Bullous emphysema - present on admission with hypercapnic respiratory failure as above.  Was treated with IV steroids, course completed. --Continue DuoNebs 3 times daily, PRN albuterol nebs --Mucinex --Solu-medrol 40 mg IV daily --Pulm is following, added Metanebs --chest PT, pulmonary toilet  Non-STEMI -present on admission, troponin peaked over 4k and downtrended.  Treating medically, heparin drip completed on 9/3. Acute diastolic CHF -likely secondary to non-STEMI.  Echo on 9/1 showed EF 60 to 65%, grade 1 diastolic dysfunction, no wall motion abnormalities. 9/9: BNP significantly higher than previous, with continued high oxygen requirement.   Net IO Since Admission: -6,555.09 mL [02/01/20 1459] --Cardiology consulted, signed off 9/7 --Follow-up with Dr.  Fath 1 week after discharge --Continue aspirin, Plavix, high intensity statin, Imdur --Cardizem CD was reduced to 180 mg --Metoprolol succinate 25  mg daily was added --Consider addition of ACE inhibitor at discharge if renal function and blood pressure tolerate --Off IV Lasix, monitor volume status --Continue home PO Lasix 20 mg daily --can d/c midodrine with BP improved --maintain MAP greater than 65 --strict I/O's and daily weights   Acute anemia superimposed on History of iron deficiency -continue oral iron supplement BID with meals. Hbg 8.0.  Trend this admission has been 8's >> up to 11 >> trended back down to 8.  No evidence of bleeding. --Monitor CBC --Transfuse if Hbg <8.0 --will check FOBT --Anemia panel w AM labs   Elevated D-dimer - had CTA chest on 9/1 which ruled out PE on admission, is getting Lovenox for VTE prophylaxis.   --9/9 lower extremity venous doppler U/S - negative for DVT bilaterally --Consider repeating CTA chest vs V/Q scan if oxygen requirements not improving.     Acute Kidney Injury - POA, resolved.  Monitor BMP daily.     Right hip fracture -present on admission, sustained during fall at ALF.  Underwent ORIF 01/26/2020 with Dr. Odis LusterBowers. --Work with PT --Weightbearing as tolerated --Per Ortho, okay to resume anticoagulation - on ASA/Plavix --Follow up with Dr. Odis LusterBowers office, the week of September 13 for staple removal, Call office to confirm appt [phone]   Generalized weakness - PT and OT are following, recommending SNF for rehab upon discharge.  GERD -continue oral Protonix daily  Depression/anxiety -continue Lexapro  Osteoporosis -takes Fosamax weekly.  Continue calcium and vitamin D supplements  Allergic rhinitis -continue loratadine   Sacral pressure injury - undetermined if present on admission, reported to attending on 9/12.  Reposition patient at least every 2 hours.  WOC consulted.  Pressure Injury 02/01/20 Sacrum Medial Stage 2 -  Partial thickness loss of dermis presenting as a shallow open injury with a red, pink wound bed without slough. (Active)  02/01/20 0900  Location:  Sacrum  Location Orientation: Medial  Staging: Stage 2 -  Partial thickness loss of dermis presenting as a shallow open injury with a red, pink wound bed without slough.  Wound Description (Comments):   Present on Admission:           Patient BMI: Body mass index is 27.12 kg/m.   DVT prophylaxis: enoxaparin (LOVENOX) injection 40 mg Start: 01/27/20 2200 SCDs Start: 01/26/20 2057   Diet:  Diet Orders (From admission, onward)    Start     Ordered   01/26/20 2057  Diet regular Room service appropriate? Yes; Fluid consistency: Thin  Diet effective now       Question Answer Comment  Room service appropriate? Yes   Fluid consistency: Thin      01/26/20 2056            Code Status: DNR    Subjective 02/01/20    Patient seen at bedside.  No acute events reported.  Written communication again used.  She reports feeling better.  Cough improved.  Eating well.  Nods yes to fever/chills, but no fevers documented.  She frequently reports being cold.  No fever or chills.  No other complaints.    Disposition Plan & Communication   Status is: Inpatient  Remains inpatient appropriate because:IV treatments appropriate due to intensity of illness or inability to take PO.  Weaning down oxygen.   Dispo: The patient is from: ALF  Anticipated d/c is to: SNF              Anticipated d/c date is: 2-3 days              Patient currently is not medically stable to d/c.    Family Communication: none at bedside, will attempt to call. Patient's legal guardian is Ms. Theodosia Blender [phone]   Consults, Procedures, Significant Events   Consultants:   Cardiology  Palliative care  Orthopedics  Procedures:   R hip ORIF on 01/26/20  Antimicrobials:   Augmentin 9/10 >>  Vancomycin 9/7 >> 9/10  Cefepime 9/7 >> 9/10  Azithromycin 9/1 >> 9/3   Objective   Vitals:   02/01/20 0733 02/01/20 0810 02/01/20 1218 02/01/20 1358  BP:  (!) 110/48 (!) 109/52   Pulse:   81 75   Resp:  20 16   Temp:  97.8 F (36.6 C) 98.3 F (36.8 C)   TempSrc:  Oral Oral   SpO2: 93% 90% 95% 97%  Weight:      Height:        Intake/Output Summary (Last 24 hours) at 02/01/2020 1459 Last data filed at 02/01/2020 1428 Gross per 24 hour  Intake 480 ml  Output 800 ml  Net -320 ml   Filed Weights   01/24/20 0538 01/25/20 0500 01/28/20 0313  Weight: 68.5 kg 73.9 kg 76.2 kg    Physical Exam:  General exam: awake, alert, no acute distress, nonverbal Respiratory system: diminished bases, on 5 L/min regular nasal cannula today, normal respiratory effort. Cardiovascular system: normal S1/S2, RRR, no pedal edema.   Gastrointestinal system: soft, NT, ND Extremities: no cyanosis, left hip dressing intact Psychiatry: normal mood, congruent affect  Labs   Data Reviewed: I have personally reviewed following labs and imaging studies  CBC: Recent Labs  Lab 01/27/20 0503 01/28/20 0422 01/29/20 0622 01/30/20 0421 01/31/20 0337  WBC 32.3* 25.8* 25.2* 20.4* 21.2*  NEUTROABS  --  23.7* 23.2* 18.1*  --   HGB 9.7* 8.5* 8.8* 8.0* 8.0*  HCT 28.3* 25.9* 26.3* 23.8* 23.8*  MCV 91.0 92.2 92.3 91.2 89.1  PLT 361 263 253 240 246   Basic Metabolic Panel: Recent Labs  Lab 01/28/20 0422 01/29/20 0622 01/30/20 0421 01/31/20 0337 02/01/20 0348  NA 134* 134* 133* 134* 136  K 3.0* 3.8 3.7 3.2* 4.2  CL 94* 92* 92* 91* 95*  CO2 28 31 32 32 33*  GLUCOSE 121* 126* 114* 102* 98  BUN 40* 37* 38* 33* 30*  CREATININE 0.89 0.86 0.88 0.80 0.69  CALCIUM 7.8* 8.2* 7.9* 8.2* 8.5*  MG  --  2.8*  --   --   --    GFR: Estimated Creatinine Clearance: 59.5 mL/min (by C-G formula based on SCr of 0.69 mg/dL). Liver Function Tests: No results for input(s): AST, ALT, ALKPHOS, BILITOT, PROT, ALBUMIN in the last 168 hours. No results for input(s): LIPASE, AMYLASE in the last 168 hours. No results for input(s): AMMONIA in the last 168 hours. Coagulation Profile: No results for input(s): INR,  PROTIME in the last 168 hours. Cardiac Enzymes: No results for input(s): CKTOTAL, CKMB, CKMBINDEX, TROPONINI in the last 168 hours. BNP (last 3 results) No results for input(s): PROBNP in the last 8760 hours. HbA1C: No results for input(s): HGBA1C in the last 72 hours. CBG: Recent Labs  Lab 01/30/20 0605  GLUCAP 112*   Lipid Profile: No results for input(s): CHOL, HDL, LDLCALC, TRIG, CHOLHDL, LDLDIRECT in the last 72 hours.  Thyroid Function Tests: No results for input(s): TSH, T4TOTAL, FREET4, T3FREE, THYROIDAB in the last 72 hours. Anemia Panel: No results for input(s): VITAMINB12, FOLATE, FERRITIN, TIBC, IRON, RETICCTPCT in the last 72 hours. Sepsis Labs: Recent Labs  Lab 01/27/20 0957 01/27/20 1247 01/28/20 0909 01/29/20 1029 01/30/20 0421  PROCALCITON 0.29  --   --  0.21 0.17  LATICACIDVEN 2.3* 2.0* 1.7  --   --     Recent Results (from the past 240 hour(s))  MRSA PCR Screening     Status: Abnormal   Collection Time: 01/22/20  5:15 PM   Specimen: Nasopharyngeal  Result Value Ref Range Status   MRSA by PCR POSITIVE (A) NEGATIVE Final    Comment:        The GeneXpert MRSA Assay (FDA approved for NASAL specimens only), is one component of a comprehensive MRSA colonization surveillance program. It is not intended to diagnose MRSA infection nor to guide or monitor treatment for MRSA infections. RESULT CALLED TO, READ BACK BY AND VERIFIED WITH: BEBRA EMBDEN 01/22/20 AT 2211 BY ACR Performed at [facility], [address] [place], [place] [address]   CULTURE, BLOOD (ROUTINE X 2) w Reflex to ID Panel     Status: None   Collection Time: 01/26/20  9:17 AM   Specimen: BLOOD  Result Value Ref Range Status   Specimen Description BLOOD LEFT ANTECUBITAL  Final   Special Requests   Final    BOTTLES DRAWN AEROBIC AND ANAEROBIC Blood Culture adequate volume   Culture   Final    NO GROWTH 5 DAYS Performed at [facility], [address]  [place], [place] [address]    Report Status 01/31/2020 FINAL  Final  CULTURE, BLOOD (ROUTINE X 2) w Reflex to ID Panel     Status: None   Collection Time: 01/26/20  9:22 AM   Specimen: BLOOD  Result Value Ref Range Status   Specimen Description BLOOD RIGHT ANTECUBITAL  Final   Special Requests   Final    BOTTLES DRAWN AEROBIC AND ANAEROBIC Blood Culture adequate volume   Culture   Final    NO GROWTH 5 DAYS Performed at [facility], [address] [place], [place] [address]    Report Status 01/31/2020 FINAL  Final  Urine Culture     Status: Abnormal   Collection Time: 01/27/20  1:14 PM   Specimen: Urine, Random  Result Value Ref Range Status   Specimen Description   Final    URINE, RANDOM Performed at [facility], [address], [place], [place] [address]    Special Requests   Final    NONE Performed at [facility], [address] [place], [place] [address]    Culture 80,000 COLONIES/mL ENTEROCOCCUS RAFFINOSUS (A)  Final   Report Status 01/29/2020 FINAL  Final   Organism ID, Bacteria ENTEROCOCCUS RAFFINOSUS (A)  Final      Susceptibility   Enterococcus raffinosus - MIC*    AMPICILLIN 16 RESISTANT Resistant     NITROFURANTOIN 32 SENSITIVE Sensitive     VANCOMYCIN <=0.5 SENSITIVE Sensitive     * 80,000 COLONIES/mL ENTEROCOCCUS RAFFINOSUS  MRSA PCR Screening     Status: None   Collection Time: 01/30/20 10:08 AM   Specimen: Nasal Mucosa; Nasopharyngeal  Result Value Ref Range Status   MRSA by PCR NEGATIVE NEGATIVE Final    Comment:        The GeneXpert MRSA Assay (FDA approved for NASAL specimens only), is one  component of a comprehensive MRSA colonization surveillance program. It is not intended to diagnose MRSA infection nor to guide or monitor treatment for MRSA infections. Performed at [facility], [address], [place], [place] [address]       Imaging Studies   No results found.   Medications   Scheduled  Meds: . acetaminophen  650 mg Oral Q6H   Or  . acetaminophen  650 mg Rectal Q6H  . acidophilus  1 capsule Oral TID  . alum & mag hydroxide-simeth  30 mL Oral TID AC & HS  . amoxicillin-clavulanate  1 tablet Oral Q12H  . aspirin EC  81 mg Oral Daily  . atorvastatin  80 mg Oral Daily  . calcium-vitamin D  1 tablet Oral BID  . cholecalciferol  5,000 Units Oral Daily  . clopidogrel  75 mg Oral Daily  . [START ON 02/02/2020] diltiazem  120 mg Oral Daily  . docusate sodium  100 mg Oral BID  . enoxaparin (LOVENOX) injection  40 mg Subcutaneous Q24H  . escitalopram  10 mg Oral Daily  . feeding supplement (ENSURE ENLIVE)  237 mL Oral TID BM  . ferrous sulfate  325 mg Oral BID WC  . furosemide  20 mg Oral Daily  . guaiFENesin  600 mg Oral Daily  . ipratropium-albuterol  3 mL Nebulization TID  . loratadine  10 mg Oral Daily  . methylPREDNISolone (SOLU-MEDROL) injection  40 mg Intravenous Daily  . [START ON 02/02/2020] metoprolol succinate  12.5 mg Oral Daily  . pantoprazole  40 mg Oral Daily  . potassium chloride SA  40 mEq Oral Daily   Continuous Infusions:      LOS: 11 days    Time spent: 25 minutes with > 50% spent in coordination of care and direct patient contact.    Pennie Banter, DO [facility]  02/01/2020, 2:59 PM    If 7PM-7AM, please contact night-coverage. How to contact the [facility] Attending or Consulting provider 7A - 7P or covering provider during after hours 7P -7A, for this patient?    1. Check the care team in [facility] and look for a) attending/consulting [facility] provider listed and b) the [facility] team listed 2. Log into [email or web address] and use [facility]'s universal password to access. If you do not have the password, please contact the hospital operator. 3. Locate the [facility] provider you are looking for under [facility] and page to a number that you can be directly reached. 4. If you still have difficulty reaching the provider, please page the [facility] (Director on  Call) for the Hospitalists listed on amion for assistance.

## 2020-02-01 NOTE — Progress Notes (Signed)
Physical Therapy Treatment Patient Details Name: Alice Wallace MRN: [id number] DOB: May 15, 1941 Today's Date: 02/01/2020    History of Present Illness Per MD notes: Pt is a 79 y.o. caucasian female with a known history of COPD, hypertension, deafness and muteness, presented to the emergency room with acute onset of fall at her assisted living facility with subsequent right hip pain and significant respiratory distress.  She was thought to be in COPD exacerbation was given IV Solu-Medrol and nebulized albuterol as well as duo nebs by EMS on route to the hospital.  Pt found to have a proximal right femoral diaphysis fracture and is now s/p R hip IM nail.  MD assessment also includes: Acute on chronic respiratory failure with COPD exacerbation /bronchitis, cannot rule out pneumonia, h/o CHF, suspected NSTEMI, HTN, hypokalemia, and depression.    PT Comments    Pt ready for session.  Participated in exercises as described below.  Pt overall seems more comfortable today and able to tolerate ex's with minimal grimacing and guarding.  She is able to transition to EOB with min a x 1.  She remains sitting x 4 minutes before self initiating return to supine with min a x 1 for LE's.  She does endorse some dizziness with sitting.  Sats 88%> with sitting.  Stable >90 with ex.on 5 lpm throughout.    Follow Up Recommendations  SNF     Equipment Recommendations       Recommendations for Other Services       Precautions / Restrictions Precautions Precautions: Fall Precaution Comments: watch BP/orthostatic Restrictions Weight Bearing Restrictions: Yes RLE Weight Bearing: Weight bearing as tolerated Other Position/Activity Restrictions: Per Dr. Denton Lank 01/29/20 OK for pt to participate fully with therapy to pt tolerance    Mobility  Bed Mobility Overal bed mobility: Needs Assistance       Supine to sit: Min assist Sit to supine: Min assist   General bed mobility comments: sat 4 minutes EOB with  supervision.  endorses dizziness.  Transfers                 General transfer comment: did not attempt due to self initiating return to supine.  Ambulation/Gait                 Stairs             Wheelchair Mobility    Modified Rankin (Stroke Patients Only)       Balance Overall balance assessment: Needs assistance Sitting-balance support: Feet unsupported;Bilateral upper extremity supported Sitting balance-Leahy Scale: Good                                      Cognition Arousal/Alertness: Awake/alert Behavior During Therapy: WFL for tasks assessed/performed Overall Cognitive Status: Within Functional Limits for tasks assessed                                 General Comments: Pt is non verbal but able to follow commands consistently. Used pen and paper to communicate at times      Exercises Other Exercises Other Exercises: A/AAROM x 10  - ankle pumps, heel slides, ab/add    General Comments        Pertinent Vitals/Pain Pain Assessment: Faces Faces Pain Scale: Hurts a little bit Pain Location: Hip and stomach Pain Descriptors / Indicators: Discomfort;Moaning;Grimacing  Pain Intervention(s): Monitored during session;Repositioned    Home Living                      Prior Function            PT Goals (current goals can now be found in the care plan section) Progress towards PT goals: Progressing toward goals    Frequency    BID      PT Plan Current plan remains appropriate    Co-evaluation              AM-PAC PT "6 Clicks" Mobility   Outcome Measure  Help needed turning from your back to your side while in a flat bed without using bedrails?: A Little Help needed moving from lying on your back to sitting on the side of a flat bed without using bedrails?: A Little Help needed moving to and from a bed to a chair (including a wheelchair)?: A Little Help needed standing up from a chair  using your arms (e.g., wheelchair or bedside chair)?: A Little Help needed to walk in hospital room?: A Lot Help needed climbing 3-5 steps with a railing? : A Lot 6 Click Score: 16    End of Session Equipment Utilized During Treatment: Oxygen Activity Tolerance: Patient tolerated treatment well Patient left: in bed;with call bell/phone within reach;with bed alarm set Nurse Communication: Mobility status Pain - Right/Left: Right Pain - part of body: Hip     Time: 0920-0943 PT Time Calculation (min) (ACUTE ONLY): 23 min  Charges:  $Therapeutic Exercise: 8-22 mins $Therapeutic Activity: 8-22 mins                    Danielle Dess, PTA 02/01/20, 9:52 AM

## 2020-02-02 LAB — BASIC METABOLIC PANEL
Anion gap: 8 (ref 5–15)
BUN: 29 mg/dL — ABNORMAL HIGH (ref 8–23)
CO2: 31 mmol/L (ref 22–32)
Calcium: 8.6 mg/dL — ABNORMAL LOW (ref 8.9–10.3)
Chloride: 97 mmol/L — ABNORMAL LOW (ref 98–111)
Creatinine, Ser: 0.82 mg/dL (ref 0.44–1.00)
GFR calc Af Amer: >60 mL/min (ref >60)
GFR calc non Af Amer: >60 mL/min (ref >60)
Glucose, Bld: 85 mg/dL (ref 70–99)
Potassium: 4.2 mmol/L (ref 3.5–5.1)
Sodium: 136 mmol/L (ref 135–145)

## 2020-02-02 LAB — CBC
HCT: 24.7 % — ABNORMAL LOW (ref 36.0–46.0)
Hemoglobin: 8.3 g/dL — ABNORMAL LOW (ref 12.0–15.0)
MCH: 29.9 pg (ref 26.0–34.0)
MCHC: 33.6 g/dL (ref 30.0–36.0)
MCV: 88.8 fL (ref 80.0–100.0)
Platelets: 269 10*3/uL (ref 150–400)
RBC: 2.78 MIL/uL — ABNORMAL LOW (ref 3.87–5.11)
RDW: 14.7 % (ref 11.5–15.5)
WBC: 18.4 10*3/uL — ABNORMAL HIGH (ref 4.0–10.5)
nRBC: 0 % (ref 0.0–0.2)

## 2020-02-02 LAB — RETICULOCYTES
Immature Retic Fract: 28 % — ABNORMAL HIGH (ref 2.3–15.9)
RBC.: 2.69 MIL/uL — ABNORMAL LOW (ref 3.87–5.11)
Retic Count, Absolute: 110.8 10*3/uL (ref 19.0–186.0)
Retic Ct Pct: 4.1 % — ABNORMAL HIGH (ref 0.4–3.1)

## 2020-02-02 LAB — IRON AND TIBC
Iron: 50 ug/dL (ref 28–170)
Saturation Ratios: 24 % (ref 10.4–31.8)
TIBC: 210 ug/dL — ABNORMAL LOW (ref 250–450)
UIBC: 160 ug/dL

## 2020-02-02 LAB — VITAMIN B12: Vitamin B-12: 411 pg/mL (ref 180–914)

## 2020-02-02 LAB — FERRITIN: Ferritin: 561 ng/mL — ABNORMAL HIGH (ref 11–307)

## 2020-02-02 LAB — FOLATE: Folate: 6.6 ng/mL (ref >5.9)

## 2020-02-02 MED ORDER — PREDNISONE 20 MG PO TABS
40.0000 mg | ORAL_TABLET | Freq: Every day | ORAL | Status: DC
  Administered 2020-02-03: 40 mg via ORAL
  Filled 2020-02-02: qty 2

## 2020-02-02 MED ORDER — ADULT MULTIVITAMIN W/MINERALS CH
1.0000 | ORAL_TABLET | Freq: Every day | ORAL | Status: DC
  Administered 2020-02-03: 1 via ORAL
  Filled 2020-02-02: qty 1

## 2020-02-02 NOTE — Progress Notes (Signed)
Subjective:  Patient reports pain as mild.    Objective:   VITALS:   Vitals:   02/02/20 0700 02/02/20 0842 02/02/20 0908 02/02/20 1121  BP:  (!) 117/48  (!) 113/52  Pulse:  72  85  Resp:  19  18  Temp:  97.9 F (36.6 C)  97.8 F (36.6 C)  TempSrc:  Oral  Oral  SpO2:  96% 96% 99%  Weight: 42 kg     Height:        PHYSICAL EXAM:  Neurologically intact ABD soft Neurovascular intact Sensation intact distally Intact pulses distally Dorsiflexion/Plantar flexion intact Incision: dressing C/D/I No cellulitis present Compartment soft  LABS  Results for orders placed or performed during the hospital encounter of 01/21/20 (from the past 24 hour(s))  Vitamin B12     Status: None   Collection Time: 02/02/20  5:00 AM  Result Value Ref Range   Vitamin B-12 411 180 - 914 pg/mL  Folate     Status: None   Collection Time: 02/02/20  5:00 AM  Result Value Ref Range   Folate 6.6 >5.9 ng/mL  Iron and TIBC     Status: Abnormal   Collection Time: 02/02/20  5:00 AM  Result Value Ref Range   Iron 50 28 - 170 ug/dL   TIBC 098 (L) 119 - 147 ug/dL   Saturation Ratios 24 10.4 - 31.8 %   UIBC 160 ug/dL  Ferritin     Status: Abnormal   Collection Time: 02/02/20  5:00 AM  Result Value Ref Range   Ferritin 561 (H) 11 - 307 ng/mL  Reticulocytes     Status: Abnormal   Collection Time: 02/02/20  5:00 AM  Result Value Ref Range   Retic Ct Pct 4.1 (H) 0.4 - 3.1 %   RBC. 2.69 (L) 3.87 - 5.11 MIL/uL   Retic Count, Absolute 110.8 19.0 - 186.0 K/uL   Immature Retic Fract 28.0 (H) 2.3 - 15.9 %  CBC     Status: Abnormal   Collection Time: 02/02/20  5:00 AM  Result Value Ref Range   WBC 18.4 (H) 4.0 - 10.5 K/uL   RBC 2.78 (L) 3.87 - 5.11 MIL/uL   Hemoglobin 8.3 (L) 12.0 - 15.0 g/dL   HCT 82.9 (L) 36 - 46 %   MCV 88.8 80.0 - 100.0 fL   MCH 29.9 26.0 - 34.0 pg   MCHC 33.6 30.0 - 36.0 g/dL   RDW 56.2 13.0 - 86.5 %   Platelets 269 150 - 400 K/uL   nRBC 0.0 0.0 - 0.2 %  Basic metabolic  panel     Status: Abnormal   Collection Time: 02/02/20  5:00 AM  Result Value Ref Range   Sodium 136 135 - 145 mmol/L   Potassium 4.2 3.5 - 5.1 mmol/L   Chloride 97 (L) 98 - 111 mmol/L   CO2 31 22 - 32 mmol/L   Glucose, Bld 85 70 - 99 mg/dL   BUN 29 (H) 8 - 23 mg/dL   Creatinine, Ser 7.84 0.44 - 1.00 mg/dL   Calcium 8.6 (L) 8.9 - 10.3 mg/dL   GFR calc non Af Amer >60 >60 mL/min   GFR calc Af Amer >60 >60 mL/min   Anion gap 8 5 - 15    No results found.  Assessment/Plan: 7 Days Post-Op   Principal Problem:   Acute respiratory failure with hypoxia and hypercapnia (HCC) Active Problems:   Hypokalemia   AKI (acute kidney injury) (HCC)  Nonspeaking deaf   Chronic obstructive pulmonary disease with acute exacerbation (HCC)   Non-STEMI (non-ST elevated myocardial infarction) (HCC)   Hip fracture, unspecified laterality, closed, initial encounter (HCC)   Goals of care, counseling/discussion   Palliative care by specialist   Nondisplaced fracture of base of neck of right femur, initial encounter for closed fracture (HCC)   Pressure injury of skin   Advance diet Up with therapy , WBAT when medically stable Discharge per medicine Follow up with [name] office in for staple removal in 1 week, Call office to confirm appt [phone] H/H holding steady  Altamese Cabal , PA-C 02/02/2020, 1:11 PM

## 2020-02-02 NOTE — TOC Progression Note (Signed)
Transition of Care [facility]) - Progression Note    Patient Details  Name: Alice Wallace MRN: [id number] Date of Birth: Mar 09, 1941  [facility]) CM/SW Contact  Chapman Fitch, RN Phone Number: 02/02/2020, 3:35 PM  Clinical Narrative:     Per MD anticipate discharge tomorrow.  Contacted [facility] and notified her that [facility] is able to offer a bed.  She states that she would like to check [facility] prior to accepting the bed at [facility]. TOC spoke to [facility].  She states she will know tomorrow morning if she is able to offer a bed   Expected Discharge Plan: Skilled Nursing Facility Barriers to Discharge: Continued Medical Work up  Expected Discharge Plan and Services Expected Discharge Plan: Skilled Nursing Facility   Discharge Planning Services: CM Consult Post Acute Care Choice: Skilled Nursing Facility Living arrangements for the past 2 months: Assisted Living Facility                                       Social Determinants of Health (SDOH) Interventions    Readmission Risk Interventions No flowsheet data found.

## 2020-02-02 NOTE — Progress Notes (Signed)
PROGRESS NOTE    Alice Wallace   XMI:[id number]  DOB: September 12, 1940  PCP: Devoria Glassing, NP    DOA: 01/21/2020 LOS: 12   Brief Narrative   [facility] assumed care on 01/28/2020, patient out of ICU.  HPI and Hospital Course summarized below:  79 y.o. Caucasian female with a known history of COPD, hypertension, deafness and muteness, (can read lips and written communications) presented to the ED on 01/21/2020 after having a fall at her assisted living facility with subsequent right hip pain and significant respiratory distress and hypoxia with O2 sat in 70's on her usual 2 L/min oxygen.   Treated with Duoneb, albuterol neb and IV Solu-medrol by EMS for presumed COPD exacerbation.    In the ED, tachycardic HR 106, tachypneic RR 23, O2 sat 76% on room air, improved to 93-96% on 6 L/min.  CTA was negative for PE, but showed RLL atelectasis vs pneumonia and advanced emphysema.  Imaging consistent with R proximal femur fracture.   She is status post right hip ORIF on 01/26/2020 with [name].  She developed significant respiratory distress post-operatively, and has ongoing requirement for heated HFNC at rates of 40-50 L/min.  Pulmonology consulted.          Assessment & Plan   Principal Problem:   Acute respiratory failure with hypoxia and hypercapnia (HCC) Active Problems:   AKI (acute kidney injury) (HCC)   Chronic obstructive pulmonary disease with acute exacerbation (HCC)   Non-STEMI (non-ST elevated myocardial infarction) (HCC)   Hypokalemia   Hip fracture, unspecified laterality, closed, initial encounter (HCC)   Goals of care, counseling/discussion   Palliative care by specialist   Nonspeaking deaf   Nondisplaced fracture of base of neck of right femur, initial encounter for closed fracture (HCC)   Pressure injury of skin   Acute respiratory failure with hypoxia and hypercapnia - improving, down to 3-4 L/min O2. Present on admission, improved, recurred post-operatively.  Due to  acute heart failure in the setting of non-STEMI with acute pulmonary edema and renal failure, underlying COPD/emphysema.   PE was ruled out by CTA chest, however bone marrow embolism s/p surgery is in the differential (would be supportive care). Expect prolonged recovery from PNA given severity of her underlying COPD/emphysema. --Continue supplemental oxygen as needed, maintain O2 sat greater than 88%, wean as tolerated --Does not tolerate BiPAP --Continue nasal cannula, HFNC oxygen as needed --Pulmonology consulted, appreciate their assistance   Community-acquired pneumonia - present on admission, RLL, suspect aspiration.  Initially respiratory failure attributed to cardiac issues so antibiotics were held.  Patient developed significant leukocytosis, and repeat chest xray on 9/7 showed stable bibasilar opacities.  Started on Vanc/Cefepime on 9/7, had initially received couple days Zithromax on admission.  Transitioned to Augmentin once improving. --Completed antibiotics, will stop Augmentin.   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  Was treated with IV steroids, course completed. --Continue DuoNebs 3 times daily, PRN albuterol nebs --Mucinex --Transition steroid to Prednisone 40 mg daily & taper  --Pulm is following, added Metanebs --chest PT, pulmonary toilet --Palliative is following  Non-STEMI -present on admission, troponin peaked over 4k and downtrended.  Treating medically, heparin drip completed on 9/3. Acute diastolic CHF -likely secondary to non-STEMI.  Echo on 9/1 showed EF 60 to 65%, grade 1 diastolic dysfunction, no wall motion abnormalities. 9/9: BNP significantly higher than previous, with continued high oxygen requirement.   Net IO Since Admission: -7,105.09 mL [  02/02/20 1436] --Cardiology consulted, signed off 9/7 --Follow-up with Dr. [name] 1 week after discharge --Continue aspirin,  Plavix, high intensity statin, Imdur --Cardizem CD was reduced to 180 mg --Metoprolol succinate 25 mg daily was added --Consider addition of ACE inhibitor at discharge if renal function and blood pressure tolerate --Off IV Lasix, monitor volume status --Continue home PO Lasix 20 mg daily --d/c'd midodrine with BP improved --maintain MAP greater than 65 --strict I/O's and daily weights   Acute anemia superimposed on History of iron deficiency -continue oral iron supplement BID with meals. Hbg 8.0.  Trend this admission has been 8's >> up to 11 >> trended back down to 8.  No evidence of bleeding. --Monitor CBC --Transfuse if Hbg <8.0 --will check FOBT --Anemia panel w AM labs   Elevated D-dimer - had CTA chest on 9/1 which ruled out PE on admission, is getting Lovenox for VTE prophylaxis.   --9/9 lower extremity venous doppler U/S - negative for DVT bilaterally --Consider repeating CTA chest vs V/Q scan if oxygen requirements not improving.     Acute Kidney Injury - POA, resolved.  Monitor BMP daily.     Right hip fracture -present on admission, sustained during fall at ALF.  Underwent ORIF 01/26/2020 with [name]. --Work with PT --Weightbearing as tolerated --Per Ortho, okay to resume anticoagulation - on ASA/Plavix --Follow up with [name] office, the week of September 13 for staple removal, Call office to confirm appt [phone]   Moderate protein calorie malnutrition - dietician consulted.   Generalized weakness - PT and OT are following, recommending SNF for rehab upon discharge.  GERD -continue oral Protonix daily  Depression/anxiety -continue Lexapro  Osteoporosis -takes Fosamax weekly.  Continue calcium and vitamin D supplements  Allergic rhinitis -continue loratadine   Sacral pressure injury - undetermined if present on admission, reported to attending on 9/12.  Reposition patient at least every 2 hours.  WOC consulted.  Pressure Injury 02/01/20 Sacrum  Medial Stage 2 -  Partial thickness loss of dermis presenting as a shallow open injury with a red, pink wound bed without slough. (Active)  02/01/20 0900  Location: Sacrum  Location Orientation: Medial  Staging: Stage 2 -  Partial thickness loss of dermis presenting as a shallow open injury with a red, pink wound bed without slough.  Wound Description (Comments):   Present on Admission: No          Patient BMI: Body mass index is 14.94 kg/m.   DVT prophylaxis: enoxaparin (LOVENOX) injection 40 mg Start: 01/27/20 2200 SCDs Start: 01/26/20 2057   Diet:  Diet Orders (From admission, onward)    Start     Ordered   01/26/20 2057  Diet regular Room service appropriate? Yes; Fluid consistency: Thin  Diet effective now       Question Answer Comment  Room service appropriate? Yes   Fluid consistency: Thin      01/26/20 2056            Code Status: DNR    Subjective 02/02/20    Patient seen at bedside.  No acute events reported.  Written communication again used.  She looks more tired today, and says she is tired.  Places her hand at left side of her face and closes her eyes.  Reports a mild headache this AM.  Feels slightly better than yesterday.  Denies hip pain.   Disposition Plan & Communication   Status is: Inpatient  Remains inpatient appropriate because:IV treatments appropriate  due to intensity of illness or inability to take PO.  Weaning down oxygen.   Dispo: The patient is from: ALF              Anticipated d/c is to: SNF              Anticipated d/c date is: 2-3 days              Patient currently is not medically stable to d/c.    Family Communication: none at bedside, will attempt to call. Patient's legal guardian is Ms. Theodosia Blender [phone]   Consults, Procedures, Significant Events   Consultants:   Cardiology  Palliative care  Orthopedics  Procedures:   R hip ORIF on 01/26/20  Antimicrobials:   Augmentin 9/10 >> 9/13  Vancomycin  9/7 >> 9/10  Cefepime 9/7 >> 9/10  Azithromycin 9/1 >> 9/3   Objective   Vitals:   02/02/20 0700 02/02/20 0842 02/02/20 0908 02/02/20 1121  BP:  (!) 117/48  (!) 113/52  Pulse:  72  85  Resp:  19  18  Temp:  97.9 F (36.6 C)  97.8 F (36.6 C)  TempSrc:  Oral  Oral  SpO2:  96% 96% 99%  Weight: 42 kg     Height:        Intake/Output Summary (Last 24 hours) at 02/02/2020 1436 Last data filed at 02/02/2020 0842 Gross per 24 hour  Intake 100 ml  Output 650 ml  Net -550 ml   Filed Weights   01/25/20 0500 01/28/20 0313 02/02/20 0700  Weight: 73.9 kg 76.2 kg 42 kg    Physical Exam:  General exam: awake, alert, no acute distress, nonverbal Respiratory system: overall clear but diminished bases, on 4 L/min nasal cannula oxygen, normal respiratory effort. Cardiovascular system: normal S1/S2, RRR, no pedal edema.   Gastrointestinal system: soft, NT, ND Psychiatry: normal mood, congruent affect  Labs   Data Reviewed: I have personally reviewed following labs and imaging studies  CBC: Recent Labs  Lab 01/28/20 0422 01/29/20 0622 01/30/20 0421 01/31/20 0337 02/02/20 0500  WBC 25.8* 25.2* 20.4* 21.2* 18.4*  NEUTROABS 23.7* 23.2* 18.1*  --   --   HGB 8.5* 8.8* 8.0* 8.0* 8.3*  HCT 25.9* 26.3* 23.8* 23.8* 24.7*  MCV 92.2 92.3 91.2 89.1 88.8  PLT 263 253 240 246 269   Basic Metabolic Panel: Recent Labs  Lab 01/29/20 0622 01/30/20 0421 01/31/20 0337 02/01/20 0348 02/02/20 0500  NA 134* 133* 134* 136 136  K 3.8 3.7 3.2* 4.2 4.2  CL 92* 92* 91* 95* 97*  CO2 31 32 32 33* 31  GLUCOSE 126* 114* 102* 98 85  BUN 37* 38* 33* 30* 29*  CREATININE 0.86 0.88 0.80 0.69 0.82  CALCIUM 8.2* 7.9* 8.2* 8.5* 8.6*  MG 2.8*  --   --   --   --    GFR: Estimated Creatinine Clearance: 36.9 mL/min (by C-G formula based on SCr of 0.82 mg/dL). Liver Function Tests: No results for input(s): AST, ALT, ALKPHOS, BILITOT, PROT, ALBUMIN in the last 168 hours. No results for input(s):  LIPASE, AMYLASE in the last 168 hours. No results for input(s): AMMONIA in the last 168 hours. Coagulation Profile: No results for input(s): INR, PROTIME in the last 168 hours. Cardiac Enzymes: No results for input(s): CKTOTAL, CKMB, CKMBINDEX, TROPONINI in the last 168 hours. BNP (last 3 results) No results for input(s): PROBNP in the last 8760 hours. HbA1C: No results for input(s): HGBA1C in the  last 72 hours. CBG: Recent Labs  Lab 01/30/20 0605  GLUCAP 112*   Lipid Profile: No results for input(s): CHOL, HDL, LDLCALC, TRIG, CHOLHDL, LDLDIRECT in the last 72 hours. Thyroid Function Tests: No results for input(s): TSH, T4TOTAL, FREET4, T3FREE, THYROIDAB in the last 72 hours. Anemia Panel: Recent Labs    02/02/20 0500  VITAMINB12 411  FOLATE 6.6  FERRITIN 561*  TIBC 210*  IRON 50  RETICCTPCT 4.1*   Sepsis Labs: Recent Labs  Lab 01/27/20 0957 01/27/20 1247 01/28/20 0909 01/29/20 1029 01/30/20 0421  PROCALCITON 0.29  --   --  0.21 0.17  LATICACIDVEN 2.3* 2.0* 1.7  --   --     Recent Results (from the past 240 hour(s))  CULTURE, BLOOD (ROUTINE X 2) w Reflex to ID Panel     Status: None   Collection Time: 01/26/20  9:17 AM   Specimen: BLOOD  Result Value Ref Range Status   Specimen Description BLOOD LEFT ANTECUBITAL  Final   Special Requests   Final    BOTTLES DRAWN AEROBIC AND ANAEROBIC Blood Culture adequate volume   Culture   Final    NO GROWTH 5 DAYS Performed at [facility], [address] [place], [place] [address]    Report Status 01/31/2020 FINAL  Final  CULTURE, BLOOD (ROUTINE X 2) w Reflex to ID Panel     Status: None   Collection Time: 01/26/20  9:22 AM   Specimen: BLOOD  Result Value Ref Range Status   Specimen Description BLOOD RIGHT ANTECUBITAL  Final   Special Requests   Final    BOTTLES DRAWN AEROBIC AND ANAEROBIC Blood Culture adequate volume   Culture   Final    NO GROWTH 5 DAYS Performed at [facility], [address] [place], [place] [address]    Report Status 01/31/2020 FINAL  Final  Urine Culture     Status: Abnormal   Collection Time: 01/27/20  1:14 PM   Specimen: Urine, Random  Result Value Ref Range Status   Specimen Description   Final    URINE, RANDOM Performed at [facility], [address] [place], [place] [address]    Special Requests   Final    NONE Performed at [facility], [address] [place], [place] [address]    Culture 80,000 COLONIES/mL ENTEROCOCCUS RAFFINOSUS (A)  Final   Report Status 01/29/2020 FINAL  Final   Organism ID, Bacteria ENTEROCOCCUS RAFFINOSUS (A)  Final      Susceptibility   Enterococcus raffinosus - MIC*    AMPICILLIN 16 RESISTANT Resistant     NITROFURANTOIN 32 SENSITIVE Sensitive     VANCOMYCIN <=0.5 SENSITIVE Sensitive     * 80,000 COLONIES/mL ENTEROCOCCUS RAFFINOSUS  MRSA PCR Screening     Status: None   Collection Time: 01/30/20 10:08 AM   Specimen: Nasal Mucosa; Nasopharyngeal  Result Value Ref Range Status   MRSA by PCR NEGATIVE NEGATIVE Final    Comment:        The GeneXpert MRSA Assay (FDA approved for NASAL specimens only), is one component of a comprehensive MRSA colonization surveillance program. It is not intended to diagnose MRSA infection nor to guide or monitor treatment for MRSA infections. Performed at [facility], [address] [place], [place] [address]       Imaging Studies   No results found.   Medications   Scheduled Meds: . acetaminophen  650 mg Oral Q6H   Or  . acetaminophen  650 mg Rectal Q6H  . acidophilus  1 capsule Oral TID  . alum & mag hydroxide-simeth  30 mL Oral TID AC & HS  . aspirin EC  81 mg Oral Daily  . atorvastatin  80 mg Oral Daily  . calcium-vitamin D  1 tablet Oral BID  . cholecalciferol  5,000 Units Oral Daily  . clopidogrel  75 mg Oral Daily  . diltiazem  120 mg Oral Daily  . docusate sodium  100 mg Oral BID  . enoxaparin (LOVENOX) injection  40  mg Subcutaneous Q24H  . escitalopram  10 mg Oral Daily  . feeding supplement (ENSURE ENLIVE)  237 mL Oral TID BM  . ferrous sulfate  325 mg Oral BID WC  . furosemide  20 mg Oral Daily  . guaiFENesin  600 mg Oral Daily  . ipratropium-albuterol  3 mL Nebulization TID  . loratadine  10 mg Oral Daily  . metoprolol succinate  12.5 mg Oral Daily  . pantoprazole  40 mg Oral Daily  . potassium chloride SA  40 mEq Oral Daily  . [START ON 02/03/2020] predniSONE  40 mg Oral Q breakfast   Continuous Infusions:      LOS: 12 days    Time spent: 20 minutes   Pennie BanterKelly A Ladarious Kresse, DO [facility]  02/02/2020, 2:36 PM    If 7PM-7AM, please contact night-coverage. How to contact the [facility] Attending or Consulting provider 7A - 7P or covering provider during after hours 7P -7A, for this patient?    1. Check the care team in [facility] and look for a) attending/consulting [facility] provider listed and b) the [facility] team listed 2. Log into [email or web address] and use [facility] universal password to access. If you do not have the password, please contact the hospital operator. 3. Locate the [facility] provider you are looking for under [facility] and page to a number that you can be directly reached. 4. If you still have difficulty reaching the provider, please page the [facility] (Director on Call) for the Hospitalists listed on amion for assistance.

## 2020-02-02 NOTE — Progress Notes (Signed)
Pulmonary Medicine          Date: 02/02/2020,   MRN# [id number] Alice Wallace [birth year]     AdmissionWeight: 55.9 kg                 CurrentWeight: 42 kg  Referring physician: Dr Denton Lank    CHIEF COMPLAINT:   Acute hypoxemic respiratory failure   HISTORY OF PRESENT ILLNESS and SUBJECTIVE FINDINGS   79 yo F with hx of COPD, HTN, bilateral hearing loss comes from assisted living with respiratory distress after mechanical fall to right hip.  She was noted to be acutely hypoxemic with sPO2<75% on 2L/min [place].  She was treated with typical COPD care path. She had CTPE which was negative for VTE.  She was found to have acute Right proximal femoral fracture. Post operatively she has increased O2 requirement.  02/01/20- Patient examined at bedside, net fluid bal 6.5L negative, mild decrement in h/h to 8 despite negative fluid balance which is concerning may need transfusion. 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    02/02/20- patient is resting in bed comfortably.  She is eating breakfast and smiling during interview and exam.  She is now close to baseline and breath sounds are non-ronchorous. Have dcd solumedrol and started pred 40 with plan to taper by  daily. Will sign off today, patient may follow up with me in clinic as she is doing well and is now at home setting of 3-4L/min.  Please continue augmentin at current dose for total of 10 days of PO therapy.  02/02/20/   PAST MEDICAL HISTORY   Past Medical History:  Diagnosis Date  . COPD (chronic obstructive pulmonary disease) (HCC)   . Deaf   . Hypertension   . Osteoporosis      SURGICAL HISTORY   Past Surgical History:  Procedure Laterality Date  . INTRAMEDULLARY (IM) NAIL INTERTROCHANTERIC Right 01/26/2020   Procedure: Right Hip IM nail with TFNA;  Surgeon: Lyndle Herrlich, MD;   Location: [facility] ORS;  Service: Orthopedics;  Laterality: Right;     FAMILY HISTORY   History reviewed. No pertinent family history.   SOCIAL HISTORY   Social History   Tobacco Use  . Smoking status: Never Smoker  . Smokeless tobacco: Never Used  Substance Use Topics  . Alcohol use: Not Currently  . Drug use: Not Currently     MEDICATIONS    Home Medication:    Current Medication:  Current Facility-Administered Medications:  .  acetaminophen (TYLENOL) tablet 650 mg, 650 mg, Oral, Q6H, 650 mg at 02/02/20 0144 **OR** acetaminophen (TYLENOL) suppository 650 mg, 650 mg, Rectal, Q6H, Griffith, Kelly A, DO .  acidophilus (RISAQUAD) capsule 1 capsule, 1 capsule, Oral, TID, Shahmehdi, Seyed A, MD, 1 capsule at 02/02/20 0826 .  albuterol (PROVENTIL) (2.5 MG/3ML) 0.083% nebulizer solution 3 mL, 3 mL, Inhalation, Q4H PRN, Lyndle Herrlich, MD .  alum & mag hydroxide-simeth (MAALOX/MYLANTA) 200-200-20 MG/5ML suspension 30 mL, 30 mL, Oral, TID AC & HS, Lyndle Herrlich, MD, 30 mL at 02/02/20 0826 .  amoxicillin-clavulanate (AUGMENTIN) 875-125 MG per tablet 1 tablet, 1 tablet, Oral, Q12H, Pennie Banter, DO, 1 tablet at 02/02/20 0827 .  aspirin EC tablet 81 mg, 81 mg, Oral, Daily, Lyndle Herrlich, MD, 81 mg at 02/02/20 0827 .  atorvastatin (LIPITOR) tablet 80 mg, 80 mg, Oral, Daily, Lyndle Herrlich, MD, 80 mg  at 02/02/20 0827 .  calcium-vitamin D (OSCAL WITH D) 500-200 MG-UNIT per tablet 1 tablet, 1 tablet, Oral, BID, Lyndle Herrlich, MD, 1 tablet at 02/02/20 [id number] .  cholecalciferol (VITAMIN D3) tablet 5,000 Units, 5,000 Units, Oral, Daily, Lyndle Herrlich, MD, 5,000 Units at 02/02/20 0827 .  clopidogrel (PLAVIX) tablet 75 mg, 75 mg, Oral, Daily, Leanora Ivanoff, PA-C, 75 mg at 02/02/20 0827 .  dextromethorphan (DELSYM) 30 MG/5ML liquid 30 mg, 30 mg, Oral, QHS PRN, Lyndle Herrlich, MD, 30 mg at 01/25/20 0858 .  diltiazem (CARDIZEM CD) 24 hr capsule 120 mg, 120 mg, Oral, Daily, Ledarius Leeson,  MD, 120 mg at 02/02/20 0826 .  docusate sodium (COLACE) capsule 100 mg, 100 mg, Oral, BID, Lyndle Herrlich, MD, 100 mg at 02/02/20 0827 .  enoxaparin (LOVENOX) injection 40 mg, 40 mg, Subcutaneous, Q24H, Shahmehdi, Seyed A, MD, 40 mg at 02/01/20 2217 .  escitalopram (LEXAPRO) tablet 10 mg, 10 mg, Oral, Daily, Lyndle Herrlich, MD, 10 mg at 02/02/20 0825 .  feeding supplement (ENSURE ENLIVE) (ENSURE ENLIVE) liquid 237 mL, 237 mL, Oral, TID BM, Lyndle Herrlich, MD, 237 mL at 01/31/20 2025 .  ferrous sulfate tablet 325 mg, 325 mg, Oral, BID WC, Lyndle Herrlich, MD, 325 mg at 02/02/20 1191 .  furosemide (LASIX) tablet 20 mg, 20 mg, Oral, Daily, Esaw Grandchild A, DO, 20 mg at 02/02/20 0827 .  guaiFENesin (MUCINEX) 12 hr tablet 600 mg, 600 mg, Oral, Daily, Lyndle Herrlich, MD, 600 mg at 02/01/20 0845 .  HYDROmorphone (DILAUDID) injection 1 mg, 1 mg, Intravenous, Q3H PRN, Lyndle Herrlich, MD, 1 mg at 01/31/20 1457 .  ipratropium-albuterol (DUONEB) 0.5-2.5 (3) MG/3ML nebulizer solution 3 mL, 3 mL, Nebulization, TID, Lyndle Herrlich, MD, 3 mL at 02/02/20 0906 .  loperamide (IMODIUM) capsule 2 mg, 2 mg, Oral, Daily PRN, Lyndle Herrlich, MD .  loratadine (CLARITIN) tablet 10 mg, 10 mg, Oral, Daily, Lyndle Herrlich, MD, 10 mg at 02/02/20 0827 .  magnesium hydroxide (MILK OF MAGNESIA) suspension 30 mL, 30 mL, Oral, Daily PRN, Lyndle Herrlich, MD .  methylPREDNISolone sodium succinate (SOLU-MEDROL) 40 mg/mL injection 40 mg, 40 mg, Intravenous, Daily, Vida Rigger, MD, 40 mg at 02/02/20 0828 .  metoCLOPramide (REGLAN) tablet 5-10 mg, 5-10 mg, Oral, Q8H PRN **OR** metoCLOPramide (REGLAN) injection 5-10 mg, 5-10 mg, Intravenous, Q8H PRN, Lyndle Herrlich, MD .  metoprolol succinate (TOPROL-XL) 24 hr tablet 12.5 mg, 12.5 mg, Oral, Daily, Karna Christmas, Trinnity Breunig, MD, 12.5 mg at 02/02/20 0827 .  morphine 2 MG/ML injection 2 mg, 2 mg, Intravenous, Q4H PRN, Esaw Grandchild A, DO, 2 mg at 02/02/20 0432 .  nitroGLYCERIN  (NITROSTAT) SL tablet 0.4 mg, 0.4 mg, Sublingual, Q5 min PRN, Lyndle Herrlich, MD, 0.4 mg at 01/24/20 0920 .  ondansetron (ZOFRAN) tablet 4 mg, 4 mg, Oral, Q6H PRN **OR** ondansetron (ZOFRAN) injection 4 mg, 4 mg, Intravenous, Q6H PRN, Lyndle Herrlich, MD, 4 mg at 01/26/20 2158 .  ondansetron (ZOFRAN) tablet 4 mg, 4 mg, Oral, Q6H PRN **OR** ondansetron (ZOFRAN) injection 4 mg, 4 mg, Intravenous, Q6H PRN, Lyndle Herrlich, MD .  pantoprazole (PROTONIX) EC tablet 40 mg, 40 mg, Oral, Daily, Lyndle Herrlich, MD, 40 mg at 02/02/20 0828 .  potassium chloride SA (KLOR-CON) CR tablet 40 mEq, 40 mEq, Oral, Daily, Lyndle Herrlich, MD, 40 mEq at 02/02/20 0826 .  tiZANidine (ZANAFLEX) tablet 4 mg, 4 mg, Oral, Q8H PRN, Lyndle Herrlich,  MD, 4 mg at 02/01/20 1602 .  traZODone (DESYREL) tablet 25 mg, 25 mg, Oral, QHS PRN, Lyndle HerrlichBowers, James R, MD    ALLERGIES   Hydrocodone, Naproxen, Other, Sulfamethoxazole-trimethoprim, and Tramadol     REVIEW OF SYSTEMS    Review of Systems:  Gen:  Denies  fever, sweats, chills weigh loss  HEENT: Denies blurred vision, double vision, ear pain, eye pain, hearing loss, nose bleeds, sore throat Cardiac:  No dizziness, chest pain or heaviness, chest tightness,edema Resp:   Denies cough or sputum porduction, shortness of breath,wheezing, hemoptysis,  Gi: Denies swallowing difficulty, stomach pain, nausea or vomiting, diarrhea, constipation, bowel incontinence Gu:  Denies bladder incontinence, burning urine Ext:   Denies Joint pain, stiffness or swelling Skin: Denies  skin rash, easy bruising or bleeding or hives Endoc:  Denies polyuria, polydipsia , polyphagia or weight change Psych:   Denies depression, insomnia or hallucinations   Other:  All other systems negative   VS: BP (!) 117/48 (BP Location: Right Arm)   Pulse 72   Temp 97.9 F (36.6 C) (Oral)   Resp 19   Ht 5\' 6"  (1.676 m)   Wt 42 kg   SpO2 96%   BMI 14.94 kg/m      PHYSICAL EXAM    GENERAL:NAD,  no fevers, chills, no weakness no fatigue HEAD: Normocephalic, atraumatic.  EYES: Pupils equal, round, reactive to light. Extraocular muscles intact. No scleral icterus.  MOUTH: Moist mucosal membrane. Dentition intact. No abscess noted.  EAR, NOSE, THROAT: Clear without exudates. No external lesions.  NECK: Supple. No thyromegaly. No nodules. No JVD.  PULMONARY: decreased air entry bilaterally with clear lung sounds CARDIOVASCULAR: S1 and S2. Regular rate and rhythm. No murmurs, rubs, or gallops. No edema. Pedal pulses 2+ bilaterally.  GASTROINTESTINAL: Soft, nontender, nondistended. No masses. Positive bowel sounds. No hepatosplenomegaly.  MUSCULOSKELETAL: No swelling, clubbing, or edema. Range of motion full in all extremities.  NEUROLOGIC: Cranial nerves II through XII are intact. No gross focal neurological deficits. Sensation intact. Reflexes intact.  SKIN: No ulceration, lesions, rashes, or cyanosis. Skin warm and dry. Turgor intact.  PSYCHIATRIC: Mood, affect within normal limits. The patient is awake, alert and oriented x 3. Insight, judgment intact.       IMAGING    DG Chest 2 View  Result Date: 01/27/2020 CLINICAL DATA:  Shortness of breath. EXAM: CHEST - 2 VIEW COMPARISON:  January 26, 2020. FINDINGS: Stable cardiomegaly. Stable old bilateral rib fractures. Severe emphysematous disease is noted in both lungs, right greater than left. No definite pneumothorax is noted. No definite pleural effusion is noted. Stable bibasilar opacities are noted which may represent pneumonia or possibly edema. Small bilateral pleural effusions may be present. IMPRESSION: Stable bibasilar opacities are noted which may represent pneumonia or possibly edema. Stable severe emphysematous disease is noted in both lungs, right greater than left. Small bilateral pleural effusions may be present. Aortic Atherosclerosis (ICD10-I70.0) and Emphysema (ICD10-J43.9). Electronically Signed   By: Lupita RaiderJames  Green Jr M.D.    On: 01/27/2020 13:55   CT Angio Chest PE W/Cm &/Or Wo Cm  Result Date: 01/21/2020 CLINICAL DATA:  Fall and shortness of breath. EXAM: CT ANGIOGRAPHY CHEST WITH CONTRAST TECHNIQUE: Multidetector CT imaging of the chest was performed using the standard protocol during bolus administration of intravenous contrast. Multiplanar CT image reconstructions and MIPs were obtained to evaluate the vascular anatomy. CONTRAST:  75mL OMNIPAQUE IOHEXOL 350 MG/ML SOLN COMPARISON:  06/09/2019 FINDINGS: Cardiovascular: Prominent right ventricular size. No pericardial effusion.  Aortic and great vessel atherosclerotic calcification. No pulmonary artery filling defect is seen. There is significant motion artifact at the lung bases. Mediastinum/Nodes: Small to moderate sliding hiatal hernia. No adenopathy. Lungs/Pleura: Opacity in the right lower lobe with volume loss. No edema, effusion, or pneumothorax. Panlobular emphysema. Dystrophic calcifications in the apical lungs. Bronchomalacia. Upper Abdomen: Atherosclerotic calcification. Musculoskeletal: Healing anterior and lateral left rib fractures. Review of the MIP images confirms the above findings. IMPRESSION: 1. Right lower lobe atelectasis versus pneumonia. 2. Moderately motion degraded chest CTA. No evidence of pulmonary embolism. 3. Advanced emphysema. Aortic Atherosclerosis (ICD10-I70.0) and Emphysema (ICD10-J43.9). Electronically Signed   By: Marnee Spring M.D.   On: 01/21/2020 05:55   CT Hip Right Wo Contrast  Result Date: 01/21/2020 CLINICAL DATA:  Hip trauma.  Fracture suspected. EXAM: CT OF THE RIGHT HIP WITHOUT CONTRAST TECHNIQUE: Multidetector CT imaging of the right hip was performed according to the standard protocol. Multiplanar CT image reconstructions were also generated. COMPARISON:  Hip x-rays from earlier same day. FINDINGS: Bones/Joint/Cartilage Linear lucency identified in the lateral cortex of the proximal right femoral diaphysis associated with focal  cortical thickening. Imaging features compatible with nondisplaced stress fracture. The abnormality is included on the extreme inferior aspect of this study and is best visualized on sagittal reconstruction image 21 of series 5. Degenerative changes are noted in the right hip. Ligaments Suboptimally assessed by CT. Muscles and Tendons No substantial hemorrhage noted in the proximal thigh musculature. Soft tissues Distended urinary bladder is incompletely visualized. IMPRESSION: Linear lucency in the lateral cortex of the proximal right femoral diaphysis associated with focal cortical thickening. Imaging features compatible with nondisplaced stress fracture. Consider MRI of the proximal right femur for definitive characterization. Electronically Signed   By: Kennith Center M.D.   On: 01/21/2020 06:02   US Venous Img Lower Bilateral (DVT)  Result Date: 01/29/2020 CLINICAL DATA:  Hypoxia.  Elevated D-dimer. EXAM: BILATERAL LOWER EXTREMITY VENOUS DOPPLER ULTRASOUND TECHNIQUE: Gray-scale sonography with compression, as well as color and duplex ultrasound, were performed to evaluate the deep venous system(s) from the level of the common femoral vein through the popliteal and proximal calf veins. COMPARISON:  None. FINDINGS: VENOUS Normal compressibility of the common femoral, superficial femoral, and popliteal veins, as well as the visualized calf veins. Visualized portions of profunda femoral vein and great saphenous vein unremarkable. No filling defects to suggest DVT on grayscale or color Doppler imaging. Doppler waveforms show normal direction of venous flow, normal respiratory plasticity and response to augmentation. OTHER None. Limitations: none IMPRESSION: Negative. Electronically Signed   By: Katherine Mantle M.D.   On: 01/29/2020 18:03   DG Chest Port 1 View  Result Date: 01/30/2020 CLINICAL DATA:  Shortness of breath, hypoxemic respiratory failure EXAM: PORTABLE CHEST 1 VIEW COMPARISON:  Multiple prior  chest x-rays. FINDINGS: Image rotated to the RIGHT. Accounting for this cardiomediastinal contours are stable as well as hilar structures. Severe bullous changes again noted in the RIGHT upper chest. Background interstitial and airspace opacities are stable and favored to represent multifocal pneumonia. No pleural effusion. Posttraumatic changes about the bilateral chest are again noted with multiple healed rib fractures and signs of previous LEFT thoracotomy. IMPRESSION: No significant change in diffuse interstitial and airspace opacities favored to represent multifocal pneumonia in this patient with marked emphysema and severe bullous changes in the RIGHT upper lobe in particular. Electronically Signed   By: Donzetta Kohut M.D.   On: 01/30/2020 08:39   DG Chest [facility]  Result Date: 01/29/2020 CLINICAL DATA:  79 year old female with history of hypoxia today. EXAM: PORTABLE CHEST 1 VIEW COMPARISON:  Chest x-ray 01/27/2020. FINDINGS: Severe emphysematous changes with extensive bullous disease in the right upper lobe. Widespread areas of interstitial prominence an ill-defined airspace disease noted throughout the mid to lower lungs bilaterally. No definite pleural effusions. No pneumothorax. No definite evidence of pulmonary edema. Mild cardiomegaly. Upper mediastinal contours are distorted by patient positioning. Numerous old healed bilateral rib fractures. IMPRESSION: 1. The appearance the chest is very similar to the prior study, again favored to reflect multilobar pneumonia. 2. Cardiomegaly. 3. Aortic atherosclerosis. 4. Emphysema. Electronically Signed   By: Trudie Reed M.D.   On: 01/29/2020 09:57   DG Chest Port 1 View  Result Date: 01/26/2020 CLINICAL DATA:  Shortness of breath. EXAM: PORTABLE CHEST 1 VIEW COMPARISON:  January 24, 2020 FINDINGS: Stably enlarged cardiac silhouette. Calcific atherosclerotic disease of the aorta. Upper lobe predominant moderate to severe emphysematous changes.  Persistent patchy interstitial and alveolar opacities in bilateral lower lobes. No evidence of pneumothorax or pleural effusion. Stable chest wall deformity from prior trauma. IMPRESSION: 1. Persistent patchy interstitial and alveolar opacities in bilateral lower lobes may represent mixed pattern pulmonary edema or bronchopneumonia. 2. Moderate to severe emphysema. 3. Stably enlarged cardiac silhouette. Electronically Signed   By: Ted Mcalpine M.D.   On: 01/26/2020 09:15   DG Chest Port 1 View  Result Date: 01/24/2020 CLINICAL DATA:  COPD exacerbation. Pt presented 01/21/2020 to the emergency room with acute onset of fall at her assisted living facility with subsequent right hip pain and significant respiratory distress. Hx - COPD, HTN, non-smoker. EXAM: PORTABLE CHEST 1 VIEW COMPARISON:  01/21/2020 FINDINGS: Changes of emphysema skeletal lung scarring and interstitial thickening are stable. Opacity at the right lung base has mildly improved from the previous study. No new lung abnormalities. No convincing pleural effusion and no pneumothorax. Cardiac silhouette is normal in size. Multiple bilateral old rib fractures are stable. IMPRESSION: 1. Mild interval improvement. Decreased opacity at the right lung base consistent with improved pneumonia or atelectasis. 2. No other change. Stable chronic findings including extensive emphysema. Electronically Signed   By: Amie Portland M.D.   On: 01/24/2020 10:43   DG Chest Portable 1 View  Result Date: 01/21/2020 CLINICAL DATA:  Larey Seat, short of breath, hypoxia EXAM: PORTABLE CHEST 1 VIEW COMPARISON:  06/07/2019 FINDINGS: Single frontal view of the chest demonstrates a stable cardiac silhouette. Extensive bullous emphysematous changes are seen, greatest at the right apex. Marked areas of scarring and fibrosis are seen throughout the lungs. Increased density at the right costophrenic angle may reflect consolidation and/or effusion. Chronic posttraumatic changes of the  thoracic cage. No acute displaced fractures. IMPRESSION: 1. 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 2. Density at the right lateral lung base may reflect focal lung consolidation and/or small effusion. Electronically Signed   By: Sharlet Salina M.D.   On: 01/21/2020 03:48   ECHOCARDIOGRAM COMPLETE  Result Date: 01/21/2020    ECHOCARDIOGRAM REPORT   Patient Name:   [facility] [place] Date of Exam: 01/21/2020 Medical Rec #:  [id number]     Height:       66.0 in Accession #:    [id number]    Weight:       123.2 lb Date of Birth:  Dec 18, 1940      BSA:          1.628 m Patient Age:    74 years  BP:           116/63 mmHg Patient Gender: F             HR:           74 bpm. Exam Location:  [facility] Procedure: 2D Echo, Cardiac Doppler and Color Doppler Indications:     NSTEMI 121.4  History:         Patient has no prior history of Echocardiogram examinations.                  COPD; Risk Factors:Hypertension.  Sonographer:     Cristela Blue RDCS (AE) Referring Phys:  [id number] Vernetta Honey MANSY Diagnosing Phys: Debbe Odea MD IMPRESSIONS  1. Left ventricular ejection fraction, by estimation, is 60 to 65%. The left ventricle has normal function. The left ventricle has no regional wall motion abnormalities. Left ventricular diastolic parameters are consistent with Grade I diastolic dysfunction (impaired relaxation).  2. Right ventricular systolic function is normal. The right ventricular size is normal.  3. Left atrial size was moderately dilated.  4. The mitral valve is normal in structure. No evidence of mitral valve regurgitation. No evidence of mitral stenosis.  5. The aortic valve is normal in structure. Aortic valve regurgitation is not visualized. No aortic stenosis is present.  6. The inferior vena cava is normal in size with greater than 50% respiratory variability, suggesting right atrial pressure of 3 mmHg. FINDINGS  Left Ventricle: Left ventricular ejection fraction, by estimation, is 60 to  65%. The left ventricle has normal function. The left ventricle has no regional wall motion abnormalities. The left ventricular internal cavity size was normal in size. There is  no left ventricular hypertrophy. Left ventricular diastolic parameters are consistent with Grade I diastolic dysfunction (impaired relaxation). Right Ventricle: The right ventricular size is normal. No increase in right ventricular wall thickness. Right ventricular systolic function is normal. Left Atrium: Left atrial size was moderately dilated. Right Atrium: Right atrial size was normal in size. Pericardium: There is no evidence of pericardial effusion. Mitral Valve: The mitral valve is normal in structure. Normal mobility of the mitral valve leaflets. No evidence of mitral valve regurgitation. No evidence of mitral valve stenosis. Tricuspid Valve: The tricuspid valve is normal in structure. Tricuspid valve regurgitation is not demonstrated. No evidence of tricuspid stenosis. Aortic Valve: The aortic valve is normal in structure. Aortic valve regurgitation is not visualized. No aortic stenosis is present. Aortic valve mean gradient measures 3.5 mmHg. Aortic valve peak gradient measures 6.5 mmHg. Aortic valve area, by VTI measures 2.46 cm. Pulmonic Valve: The pulmonic valve was not well visualized. Pulmonic valve regurgitation is not visualized. No evidence of pulmonic stenosis. Aorta: The aortic root is normal in size and structure. Venous: The inferior vena cava was not well visualized. The inferior vena cava is normal in size with greater than 50% respiratory variability, suggesting right atrial pressure of 3 mmHg. IAS/Shunts: No atrial level shunt detected by color flow Doppler.  LEFT VENTRICLE PLAX 2D LVIDd:         4.77 cm  Diastology LVIDs:         3.08 cm  LV e' lateral:   7.83 cm/s LV PW:         1.15 cm  LV E/e' lateral: 11.6 LV IVS:        1.11 cm  LV e' medial:    5.87 cm/s LVOT diam:     2.00 cm  LV E/e' medial:  15.5  LV SV:          68 LV SV Index:   42 LVOT Area:     3.14 cm  RIGHT VENTRICLE RV Basal diam:  3.63 cm RV S prime:     12.40 cm/s TAPSE (M-mode): 3.6 cm LEFT ATRIUM             Index       RIGHT ATRIUM           Index LA diam:        4.80 cm 2.95 cm/m  RA Area:     15.10 cm LA Vol (A2C):   83.7 ml 51.42 ml/m RA Volume:   35.80 ml  21.99 ml/m LA Vol (A4C):   96.5 ml 59.28 ml/m LA Biplane Vol: 91.0 ml 55.90 ml/m  AORTIC VALVE                   PULMONIC VALVE AV Area (Vmax):    2.25 cm    PV Vmax:        0.92 m/s AV Area (Vmean):   2.51 cm    PV Peak grad:   3.4 mmHg AV Area (VTI):     2.46 cm    RVOT Peak grad: 5 mmHg AV Vmax:           127.00 cm/s AV Vmean:          86.450 cm/s AV VTI:            0.277 m AV Peak Grad:      6.5 mmHg AV Mean Grad:      3.5 mmHg LVOT Vmax:         90.90 cm/s LVOT Vmean:        69.000 cm/s LVOT VTI:          0.217 m LVOT/AV VTI ratio: 0.78  AORTA Ao Root diam: 3.50 cm MITRAL VALVE                TRICUSPID VALVE MV Area (PHT): 3.34 cm     TR Peak grad:   30.7 mmHg MV Decel Time: 227 msec     TR Vmax:        277.00 cm/s MV E velocity: 91.20 cm/s MV A velocity: 137.00 cm/s  SHUNTS MV E/A ratio:  0.67         Systemic VTI:  0.22 m                             Systemic Diam: 2.00 cm Debbe Odea MD Electronically signed by Debbe Odea MD Signature Date/Time: 01/21/2020/4:26:00 PM    Final    DG HIP OPERATIVE UNILAT W OR W/O PELVIS RIGHT  Result Date: 01/26/2020 CLINICAL DATA:  Portable operative images for right proximal femur ORIF. EXAM: OPERATIVE RIGHT HIP (WITH PELVIS IF PERFORMED) 5 VIEWS TECHNIQUE: Fluoroscopic spot image(s) were submitted for interpretation post-operatively. COMPARISON:  01/25/2020 FINDINGS: Five spot fluoro graphic operative images show placement of a long medullary rod through the femur supporting a compression screw. The proximal femur fracture components have been reduced, with the distal fracture component minimally displaced medial compared to the  proximal component by 6-7 mm. Orthopedic hardware appears well seated and aligned. IMPRESSION: 1. Significant reduction of the proximal right femur fracture following ORIF. Electronically Signed   By: Amie Portland M.D.   On: 01/26/2020 18:41   DG HIP UNILAT WITH PELVIS 2-3 VIEWS RIGHT  Result  Date: 01/25/2020 CLINICAL DATA:  Dislocated hip, right, initial encounter. Worsening right hip pain. EXAM: DG HIP (WITH OR WITHOUT PELVIS) 2-3V RIGHT COMPARISON:  Radiograph and CT 01/21/2020, radiograph 07/04/2019 FINDINGS: The previous incomplete subtrochanteric fracture of the right hip is now a complete fracture, displaced and significantly angulated with apex anterior angulation of 79 degrees. Fracture may be bisphosphonate related with cortical thickening noted on February 2021 exam. The femoral head remains seated in the acetabulum without dislocation. Moderate right hip osteoarthritis. Pubic rami are intact. IMPRESSION: 1. The previous incomplete subtrochanteric fracture is now a complete fracture, displaced with significant angulation. 2. Femoral head is well seated in the acetabulum without dislocation. Electronically Signed   By: Narda Rutherford M.D.   On: 01/25/2020 17:15   DG Hip Unilat W or Wo Pelvis 2-3 Views Right  Result Date: 01/21/2020 CLINICAL DATA:  Larey Seat, pain EXAM: DG HIP (WITH OR WITHOUT PELVIS) 2-3V RIGHT COMPARISON:  01/31/2017 FINDINGS: Frontal view of the pelvis as well as frontal and cross-table lateral views of the right hip are obtained. There is a nondisplaced transverse fracture through the proximal femoral diaphysis. Alignment is anatomic. There are no other acute displaced fractures. The hips are well aligned. Mild bilateral hip osteoarthritis. Sacroiliac joints are normal. IMPRESSION: 1. Nondisplaced transverse fracture through the proximal right femoral diaphysis. Fracture line involves an area of chronic focal cortical thickening, and likely reflects an acute fracture at a site of  chronic stress. Electronically Signed   By: Sharlet Salina M.D.   On: 01/21/2020 03:46      ASSESSMENT/PLAN    Acute hypoxemic respiratory failure -s/p mechanical fall unknown time down - has RLL consolidated infiltrate, unclearly defined due to significant motion artifact as shown in pictorial documentation above.  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  Telemetry monitoring for arrythmia 9/13 - patient is improved close to baseline and is at 3-4L/min, continue current therapy and add PT/OT with any additional recommendations for PT as per their asessment.  Will sign off today , please reach out via Epic with any questions   Advanced COPD with bullous emphysema   - continue typical COPD care path   -continue solumedrol 40 daily >>>>prednisone 40 with taper   -patient with overall poor prognosis -appreciate Palliative care evaluation   Acute blood loss anemia   - transfuse to goal Hemoglobin >8   -9/13 - mild incrementation of hb overnight likely due to hemoconcentration, stable h/h overnight   Moderate protein calorie malnutrition    - peripheral and bitemporal muscle atrophy    - recommend dietary evaluation by RD -  order placed      Thank you for allowing me to participate in the care of this patient.    Patient/Family are satisfied with  care plan and all questions have been answered.  This document was prepared using Dragon voice recognition software and may include unintentional dictation errors.     Ottie Glazier, M.D.  Division of [place]

## 2020-02-02 NOTE — Care Management Important Message (Signed)
Important Message  Patient Details  Name: Alice Wallace MRN: [id number] Date of Birth: 1940-11-27   Medicare Important Message Given:  Yes     [name] 02/02/2020, 2:17 PM

## 2020-02-02 NOTE — Progress Notes (Signed)
Occupational Therapy Treatment Patient Details Name: Alice Wallace MRN: [id number] DOB: 1940-11-22 Today's Date: 02/02/2020    History of present illness Per MD notes: Pt is a 79 y.o. caucasian female with a known history of COPD, hypertension, deafness and muteness, presented to the emergency room with acute onset of fall at her assisted living facility with subsequent right hip pain and significant respiratory distress.  She was thought to be in COPD exacerbation was given IV Solu-Medrol and nebulized albuterol as well as duo nebs by EMS on route to the hospital.  Pt found to have a proximal right femoral diaphysis fracture and is now s/p R hip IM nail.  MD assessment also includes: Acute on chronic respiratory failure with COPD exacerbation /bronchitis, cannot rule out pneumonia, h/o CHF, suspected NSTEMI, HTN, hypokalemia, and depression.   OT comments  Pt agreeable to OT intervention. Pt's orthostatic vitals taken secondary to pt reporting dizziness on EOB earlier in PT session (see below). Pt performed bed mobility with min A overall and standing with min A from EOB. Pt noted to be incontinent of BM in bed. Pt unable to stand long enough for therapist to assist with hygiene. Hygiene and clothing management from bed level with pt rolling L <> R with min A. Total A for hygiene and hospital gown change. Pt very fatigued at this point and remains supine in bed with NT present.   Supine:116/54 EOB: 114/60 After standing for 4 minutes: 103/62   Follow Up Recommendations  SNF;Supervision - Intermittent          Precautions / Restrictions Precautions Precautions: Fall Precaution Comments: watch BP/orthostatic Restrictions Weight Bearing Restrictions: Yes RLE Weight Bearing: Weight bearing as tolerated Other Position/Activity Restrictions: Per Dr. Denton Lank 01/29/20 OK for pt to participate fully with therapy to pt tolerance       Mobility Bed Mobility Overal bed mobility: Needs  Assistance Bed Mobility: Rolling;Supine to Sit;Sit to Supine Rolling: Min assist   Supine to sit: Min assist Sit to supine: Min assist      Transfers Overall transfer level: Needs assistance Equipment used: Rolling walker (2 wheeled) Transfers: Sit to/from Stand Sit to Stand: Min assist;From elevated surface              Balance Overall balance assessment: Needs assistance Sitting-balance support: Feet unsupported;Bilateral upper extremity supported Sitting balance-Leahy Scale: Good     Standing balance support: Bilateral upper extremity supported Standing balance-Leahy Scale: Poor Standing balance comment: Heavy lean on the RW for support          ADL either performed or assessed with clinical judgement   ADL Overall ADL's : Needs assistance/impaired           General ADL Comments: Pt having BM in bed and unaware. Total A for hygiene and clothing management.     Vision Baseline Vision/History: No visual deficits Patient Visual Report: No change from baseline            Cognition Arousal/Alertness: Awake/alert Behavior During Therapy: WFL for tasks assessed/performed Overall Cognitive Status: Within Functional Limits for tasks assessed                        Pertinent Vitals/ Pain       Pain Assessment: Faces Faces Pain Scale: Hurts little more Pain Location: Hip and stomach Pain Descriptors / Indicators: Discomfort;Moaning;Grimacing Pain Intervention(s): Limited activity within patient's tolerance;Repositioned         Frequency  Min 1X/week  Progress Toward Goals  OT Goals(current goals can now be found in the care plan section)  Progress towards OT goals: Progressing toward goals  Acute Rehab OT Goals Patient Stated Goal: non verbal OT Goal Formulation: With patient Time For Goal Achievement: 02/16/20 Potential to Achieve Goals: Good  Plan Discharge plan remains appropriate       AM-PAC OT "6 Clicks" Daily Activity      Outcome Measure   Help from another person eating meals?: None Help from another person taking care of personal grooming?: A Little Help from another person toileting, which includes using toliet, bedpan, or urinal?: A Lot Help from another person bathing (including washing, rinsing, drying)?: A Lot Help from another person to put on and taking off regular upper body clothing?: A Little Help from another person to put on and taking off regular lower body clothing?: A Lot 6 Click Score: 16    End of Session Equipment Utilized During Treatment: Rolling walker  OT Visit Diagnosis: Unsteadiness on feet (R26.81);Muscle weakness (generalized) (M62.81);History of falling (Z91.81)   Activity Tolerance Patient tolerated treatment well   Patient Left in bed;with call bell/phone within reach;with bed alarm set   Nurse Communication Mobility status;Other (comment) (BM)        Time: 3546-5681 OT Time Calculation (min): 28 min  Charges: OT General Charges $OT Visit: 1 Visit OT Treatments $Self Care/Home Management : 8-22 mins $Therapeutic Activity: 8-22 mins  Jackquline Denmark, MS, OTR/L , CBIS ascom [phone]  02/02/20, 4:33 PM

## 2020-02-02 NOTE — Progress Notes (Signed)
PT Cancellation Note  Patient Details Name: Alice Wallace MRN: [id number] DOB: Oct 10, 1940   Cancelled Treatment:    Reason Eval/Treat Not Completed: Fatigue/lethargy limiting ability to participate   Pt finishing with OT upon arrival.  Fatigued and waived me away.  OT took orthostatic vitals.  Generally stable.  See OT note for details.   Danielle Dess 02/02/2020, 3:35 PM

## 2020-02-02 NOTE — Progress Notes (Signed)
   02/02/20 1400  Clinical Encounter Type  Visited With Patient  Visit Type Initial;Spiritual support;Social support  Referral From Chaplain  Consult/Referral To Chaplain  Ch visited PT while rounding unit. PT did not want to see Ch. I will follow-up with Pt.

## 2020-02-02 NOTE — Consult Note (Signed)
WOC Nurse Consult Note: Reason for Consult: Consult requested for sacrum; pt is very thin Wound type: Bilat buttocks red but blanches, .2X.2X.1cm dark red stage 2 pressure injury in the center sacrum area Dressing procedure/placement/frequency: Topical treatment orders provided for the bedside nurses to perform to protect and promote healing as follows:  Foam dressing to sacrum to sacrum, change Q 3 days or PRN soiling. Please re-consult if further assistance is needed.  Thank-you,  Cammie Mcgee MSN, RN, CWOCN, [place], CNS [phone]

## 2020-02-02 NOTE — Progress Notes (Signed)
Initial Nutrition Assessment  DOCUMENTATION CODES:   Non-severe (moderate) malnutrition in context of chronic illness  INTERVENTION:   Ensure Enlive po TID, each supplement provides 350 kcal and 20 grams of protein  Magic cup TID with meals, each supplement provides 290 kcal and 9 grams of protein  MVI daily   NUTRITION DIAGNOSIS:   Moderate Malnutrition related to chronic illness (COPD) as evidenced by mild fat depletion, mild muscle depletion, moderate muscle depletion.  GOAL:   Patient will meet greater than or equal to 90% of their needs  MONITOR:   PO intake, Supplement acceptance, Labs, Skin, I & O's  REASON FOR ASSESSMENT:   Consult Assessment of nutrition requirement/status  ASSESSMENT:   79 y.o. female with a known history of COPD, hypertension, deafness and muteness (can read lips and written communications) who is admitted with hip fx after fall s/p ORIF 9/6, CAP, CHF and NSTEMI   Met with pt in room today. RD was able to communicate with pt by writing back and forth on a pad. Pt reports good appetite and oral intake pta and in hospital. Per chart, pt is eating 25% of meals in hospital. Pt reports that she does like Ensure supplements but she is ordered for them in hospital and has refused almost all of them. RD discussed with pt the importance of adequate nutrition needed to preserve lean muscle and support healing. Pt agrees to drink the Ensure now. RD will also add Magic Cups to meal trays and daily MVI. Per chart, it is difficult to determine if patient has had any significant weight changes as her documented weights are inconsistent. There is one weight from chart last July where pt weighed 172lbs. Pt does meet criteria for non-severe moderate malnutrition today.   Medications reviewed and include: risaquad, maalox, aspirin, oscal w/ D, D3, plavix, colace, ferrous sulfate, lasix, protonix, prednisone, KCl  Labs reviewed: BUN 29(H) Wbc- 18.4(H), Hgb 8.3(L), Hct  24.7(L)  NUTRITION - FOCUSED PHYSICAL EXAM:    Most Recent Value  Orbital Region No depletion  Upper Arm Region Mild depletion  Thoracic and Lumbar Region Mild depletion  Buccal Region No depletion  Temple Region No depletion  Clavicle Bone Region Mild depletion  Clavicle and Acromion Bone Region Mild depletion  Scapular Bone Region No depletion  Dorsal Hand Mild depletion  Patellar Region Moderate depletion  Anterior Thigh Region Moderate depletion  Posterior Calf Region Moderate depletion  Edema (RD Assessment) Mild  Hair Reviewed  Eyes Reviewed  Mouth Reviewed  Skin Reviewed  Nails Reviewed     Diet Order:   Diet Order            Diet regular Room service appropriate? Yes; Fluid consistency: Thin  Diet effective now                EDUCATION NEEDS:   Education needs have been addressed  Skin:  Skin Assessment: Reviewed RN Assessment (Bilat buttocks red but blanches, .2X.2X.1cm dark red stage 2 pressure injury in the center sacrum area, closed incision R hip)  Last BM:  9/12- TYPE 6  Height:   Ht Readings from Last 1 Encounters:  01/21/20 _0  (1.676 m)    Weight:   Wt Readings from Last 1 Encounters:  02/02/20 42 kg    Ideal Body Weight:  59 kg  BMI:  Body mass index is 14.94 kg/m.  Estimated Nutritional Needs:   Kcal:  1400-1600kcal/day  Protein:  70-80g/day  Fluid:  >1.4L/day  Koleen Distance  MS, RD, LDN Please refer to [facility] for RD and/or RD on-call/weekend/after hours pager

## 2020-02-02 NOTE — Progress Notes (Signed)
Physical Therapy Treatment Patient Details Name: Alice Wallace MRN: [id number] DOB: 01/03/1941 Today's Date: 02/02/2020    History of Present Illness Per MD notes: Pt is a 79 y.o. caucasian female with a known history of COPD, hypertension, deafness and muteness, presented to the emergency room with acute onset of fall at her assisted living facility with subsequent right hip pain and significant respiratory distress.  She was thought to be in COPD exacerbation was given IV Solu-Medrol and nebulized albuterol as well as duo nebs by EMS on route to the hospital.  Pt found to have a proximal right femoral diaphysis fracture and is now s/p R hip IM nail.  MD assessment also includes: Acute on chronic respiratory failure with COPD exacerbation /bronchitis, cannot rule out pneumonia, h/o CHF, suspected NSTEMI, HTN, hypokalemia, and depression.    PT Comments    Pt ready for session.  To EOB with min a x 1.  She sits about 4 minutes before returning to supine with c/o dizziness.  Pt has been orthostatic in the past.  Will re-check next time we sit.  Sats 87-90% with O2 support.   Follow Up Recommendations  SNF     Equipment Recommendations       Recommendations for Other Services       Precautions / Restrictions Precautions Precautions: Fall Precaution Comments: watch BP/orthostatic Restrictions Weight Bearing Restrictions: Yes RLE Weight Bearing: Weight bearing as tolerated Other Position/Activity Restrictions: Per Dr. Denton Lank 01/29/20 OK for pt to participate fully with therapy to pt tolerance    Mobility  Bed Mobility Overal bed mobility: Needs Assistance       Supine to sit: Min assist Sit to supine: Min assist   General bed mobility comments: sat 4 minutes EOB with supervision.  endorses dizziness.  Transfers   Equipment used: Rolling walker (2 wheeled) Transfers: Sit to/from Stand Sit to Stand: Min assist;From elevated surface Stand pivot transfers: Min assist;Mod  assist       General transfer comment: did not attempt due to self initiating return to supine due to dizziness  Ambulation/Gait             General Gait Details: not attempted due to pt tolerance for activity   Stairs             Wheelchair Mobility    Modified Rankin (Stroke Patients Only)       Balance Overall balance assessment: Needs assistance Sitting-balance support: Feet unsupported;Bilateral upper extremity supported Sitting balance-Leahy Scale: Good                                      Cognition Arousal/Alertness: Awake/alert Behavior During Therapy: WFL for tasks assessed/performed Overall Cognitive Status: Within Functional Limits for tasks assessed                                 General Comments: Pt is non verbal but able to follow commands consistently. Used pen and paper to communicate at times      Exercises      General Comments        Pertinent Vitals/Pain Pain Assessment: Faces Faces Pain Scale: Hurts little more Pain Location: Hip and stomach Pain Descriptors / Indicators: Discomfort;Moaning;Grimacing Pain Intervention(s): Limited activity within patient's tolerance;Premedicated before session;Repositioned    Home Living  Prior Function            PT Goals (current goals can now be found in the care plan section) Progress towards PT goals: Progressing toward goals    Frequency    BID      PT Plan Current plan remains appropriate    Co-evaluation              AM-PAC PT "6 Clicks" Mobility   Outcome Measure  Help needed turning from your back to your side while in a flat bed without using bedrails?: A Little Help needed moving from lying on your back to sitting on the side of a flat bed without using bedrails?: A Little Help needed moving to and from a bed to a chair (including a wheelchair)?: A Little Help needed standing up from a chair using  your arms (e.g., wheelchair or bedside chair)?: A Little Help needed to walk in hospital room?: A Lot Help needed climbing 3-5 steps with a railing? : Total 6 Click Score: 15    End of Session Equipment Utilized During Treatment: Oxygen Activity Tolerance: Patient tolerated treatment well Patient left: in bed;with call bell/phone within reach;with bed alarm set Nurse Communication: Mobility status Pain - Right/Left: Right Pain - part of body: Hip     Time: 9147-8295 PT Time Calculation (min) (ACUTE ONLY): 11 min  Charges:  $Therapeutic Activity: 8-22 mins                    Danielle Dess, PTA 02/02/20, 10:52 AM

## 2020-02-02 NOTE — Plan of Care (Signed)
  Problem: Clinical Measurements: Goal: Will remain free from infection Outcome: Progressing   Problem: Pain Managment: Goal: General experience of comfort will improve Outcome: Progressing   Problem: Safety: Goal: Ability to remain free from injury will improve Outcome: Progressing   

## 2020-02-03 ENCOUNTER — Other Ambulatory Visit: Payer: Self-pay

## 2020-02-03 DIAGNOSIS — I5032 Chronic diastolic (congestive) heart failure: Secondary | ICD-10-CM | POA: Diagnosis present

## 2020-02-03 DIAGNOSIS — E440 Moderate protein-calorie malnutrition: Secondary | ICD-10-CM | POA: Insufficient documentation

## 2020-02-03 LAB — BASIC METABOLIC PANEL
Anion gap: 9 (ref 5–15)
BUN: 23 mg/dL (ref 8–23)
CO2: 29 mmol/L (ref 22–32)
Calcium: 8.4 mg/dL — ABNORMAL LOW (ref 8.9–10.3)
Chloride: 98 mmol/L (ref 98–111)
Creatinine, Ser: 0.57 mg/dL (ref 0.44–1.00)
GFR calc Af Amer: >60 mL/min (ref >60)
GFR calc non Af Amer: >60 mL/min (ref >60)
Glucose, Bld: 86 mg/dL (ref 70–99)
Potassium: 4.1 mmol/L (ref 3.5–5.1)
Sodium: 136 mmol/L (ref 135–145)

## 2020-02-03 LAB — CBC
HCT: 26.9 % — ABNORMAL LOW (ref 36.0–46.0)
Hemoglobin: 8.6 g/dL — ABNORMAL LOW (ref 12.0–15.0)
MCH: 29.9 pg (ref 26.0–34.0)
MCHC: 32 g/dL (ref 30.0–36.0)
MCV: 93.4 fL (ref 80.0–100.0)
Platelets: 282 10*3/uL (ref 150–400)
RBC: 2.88 MIL/uL — ABNORMAL LOW (ref 3.87–5.11)
RDW: 14.9 % (ref 11.5–15.5)
WBC: 19.1 10*3/uL — ABNORMAL HIGH (ref 4.0–10.5)
nRBC: 0 % (ref 0.0–0.2)

## 2020-02-03 LAB — MAGNESIUM: Magnesium: 2.3 mg/dL (ref 1.7–2.4)

## 2020-02-03 MED ORDER — TRAZODONE HCL 50 MG PO TABS: 25.0000 mg | ORAL_TABLET | Freq: Every evening | ORAL | Status: DC | PRN

## 2020-02-03 MED ORDER — PREDNISONE 20 MG PO TABS: 30.0000 mg | ORAL_TABLET | Freq: Every day | ORAL | Status: DC

## 2020-02-03 MED ORDER — RISAQUAD PO CAPS: 1.0000 | ORAL_CAPSULE | Freq: Three times a day (TID) | ORAL | Status: AC

## 2020-02-03 MED ORDER — DOCUSATE SODIUM 100 MG PO CAPS: 100.0000 mg | ORAL_CAPSULE | Freq: Two times a day (BID) | ORAL | 0 refills | Status: AC

## 2020-02-03 MED ORDER — NITROGLYCERIN 0.4 MG SL SUBL: 0.4000 mg | SUBLINGUAL_TABLET | SUBLINGUAL | 12 refills | Status: AC | PRN

## 2020-02-03 MED ORDER — CALCIUM CARBONATE-VITAMIN D 500-200 MG-UNIT PO TABS: 1.0000 | ORAL_TABLET | Freq: Two times a day (BID) | ORAL | Status: DC

## 2020-02-03 MED ORDER — ONDANSETRON HCL 4 MG PO TABS: 4.0000 mg | ORAL_TABLET | Freq: Four times a day (QID) | ORAL | 0 refills | Status: AC | PRN

## 2020-02-03 MED ORDER — DILTIAZEM HCL ER COATED BEADS 120 MG PO CP24: 120.0000 mg | ORAL_CAPSULE | Freq: Every day | ORAL | Status: AC

## 2020-02-03 MED ORDER — ATORVASTATIN CALCIUM 80 MG PO TABS: 80.0000 mg | ORAL_TABLET | Freq: Every day | ORAL | Status: AC

## 2020-02-03 MED ORDER — PREDNISONE 10 MG PO TABS: ORAL_TABLET | ORAL | 0 refills | Status: AC

## 2020-02-03 MED ORDER — METOPROLOL SUCCINATE ER 25 MG PO TB24: 12.5000 mg | ORAL_TABLET | Freq: Every day | ORAL | Status: DC

## 2020-02-03 MED ORDER — CLOPIDOGREL BISULFATE 75 MG PO TABS: 75.0000 mg | ORAL_TABLET | Freq: Every day | ORAL | Status: AC

## 2020-02-03 MED ORDER — ACETAMINOPHEN 325 MG PO TABS: 650.0000 mg | ORAL_TABLET | Freq: Four times a day (QID) | ORAL | Status: AC

## 2020-02-03 MED ORDER — METOCLOPRAMIDE HCL 5 MG PO TABS: 5.0000 mg | ORAL_TABLET | Freq: Three times a day (TID) | ORAL | Status: DC | PRN

## 2020-02-03 MED ORDER — PREDNISONE 10 MG PO TABS: 10.0000 mg | ORAL_TABLET | Freq: Every day | ORAL | Status: DC

## 2020-02-03 MED ORDER — PREDNISONE 20 MG PO TABS: 20.0000 mg | ORAL_TABLET | Freq: Every day | ORAL | Status: DC

## 2020-02-03 NOTE — TOC Transition Note (Signed)
Transition of Care [facility]) - CM/SW Discharge Note   Patient Details  Name: Alice Wallace MRN: [id number] Date of Birth: 08/06/1940  Transition of Care [facility] CM/SW Contact:  Alice Krabbe, LCSW Phone Number: 02/03/2020, 12:44 PM   Clinical Narrative:  Alice Wallace will not offer pt a bed. Pt's legal guardian is agreeable to [facility].  Clinical Social Worker facilitated patient discharge including contacting patient family and facility to confirm patient discharge plans.  Clinical information faxed to facility and family agreeable with plan.  CSW arranged ambulance transport via [facility] to [facility].  RN to call [phone] for report prior to discharge.     Final next level of care: Skilled Nursing Facility Barriers to Discharge: No Barriers Identified   Patient Goals and CMS Choice Patient states their goals for this hospitalization and ongoing recovery are:: for pt to get rehab   Choice offered to / list presented to : [facility] POA / Guardian  Discharge Placement              Patient chooses bed at: [facility] Patient to be transferred to facility by: [facility] Name of family member notified: Alice Wallace Patient and family notified of of transfer: 02/03/20  Discharge Plan and Services   Discharge Planning Services: CM Consult Post Acute Care Choice: Skilled Nursing Facility                               Social Determinants of Health (SDOH) Interventions     Readmission Risk Interventions No flowsheet data found.

## 2020-02-03 NOTE — Progress Notes (Signed)
Called Patient's legal guardian- spoke to Mrs. Monroe.  Let her know patient being transferred to [facility].     Tried to call report to [facility]-  No one picked up

## 2020-02-03 NOTE — Progress Notes (Signed)
[facility] Liaison note:  New referral [facility] Palliative program to follow at [facility] received from Palliative NP [employer]. TOC Bridget Cobb made aware. Patient information given to referral. Plan is for discharge today. Dayna Barker BSN, RN, [facility] [employer] [phone]

## 2020-02-03 NOTE — Progress Notes (Signed)
Physical Therapy Treatment Patient Details Name: Alice Wallace MRN: [id number] DOB: 08/10/1940 Today's Date: 02/03/2020    History of Present Illness Per MD notes: Pt is a 79 y.o. caucasian female with a known history of COPD, hypertension, deafness and muteness, presented to the emergency room with acute onset of fall at her assisted living facility with subsequent right hip pain and significant respiratory distress.  She was thought to be in COPD exacerbation was given IV Solu-Medrol and nebulized albuterol as well as duo nebs by EMS on route to the hospital.  Pt found to have a proximal right femoral diaphysis fracture and is now s/p R hip IM nail.  MD assessment also includes: Acute on chronic respiratory failure with COPD exacerbation /bronchitis, cannot rule out pneumonia, h/o CHF, suspected NSTEMI, HTN, hypokalemia, and depression.    PT Comments    Pt was pleasant and participated in the session. Pt has been orthostatic in previous session, which along with pain, has limited her ability to move OOB. Pt experiencing some dizziness while sitting EOB but did not display orthostasis and was less symptomatic while fully standing. Pt needed consistent encouragement to come to standing and was able to perform a few alternating marches, but not ambulate due to pain. Pt was educated about the importance of getting out of bed and how it is related to her recent symptoms of dizziness. Pt will benefit from PT services in a SNF setting upon discharge to safely address deficits listed in patient problem list for decreased caregiver assistance and eventual return to PLOF.     Follow Up Recommendations  SNF     Equipment Recommendations    to be determined at next level of care   Recommendations for Other Services       Precautions / Restrictions Precautions Precautions: Fall Precaution Comments: watch BP/orthostatic Restrictions Other Position/Activity Restrictions: pt BP assessed in lying and  in sitting and BP did not drop    Mobility  Bed Mobility Overal bed mobility: Needs Assistance Bed Mobility: Rolling;Supine to Sit;Sit to Supine Rolling: Min assist   Supine to sit: Min assist Sit to supine: Mod assist   General bed mobility comments: sat EOB for 3 minutes with SBA while blood pressure was taken  Transfers Overall transfer level: Needs assistance Equipment used: Rolling walker (2 wheeled) Transfers: Sit to/from Stand Sit to Stand: Min assist;From elevated surface         General transfer comment: pt indicates pain at the hip while standing but able to take weight on it  Ambulation/Gait             General Gait Details: pt unable to walk but could perform a few alternating Catering manager Rankin (Stroke Patients Only)       Balance Overall balance assessment: Needs assistance Sitting-balance support: Feet unsupported;Single extremity supported Sitting balance-Leahy Scale: Good Sitting balance - Comments: pt able to sit EOB for 3 minutes with SBA   Standing balance support: Bilateral upper extremity supported Standing balance-Leahy Scale: Poor Standing balance comment: Heavy lean on the RW for support                            Cognition Arousal/Alertness: Awake/alert Behavior During Therapy: WFL for tasks assessed/performed Overall Cognitive Status: Within Functional Limits for tasks assessed  General Comments: Pt is non verbal but able to follow commands consistently. Used pen and paper to communicate at times      Exercises Total Joint Exercises Ankle Circles/Pumps: PROM;Left;Right;10 reps Quad Sets: AROM;10 reps;Both (AROM R 5 but painful) Short Arc Quad: AROM;Left;10 reps (AAROM R 10) Heel Slides:  (AAROM R 5 reps) Straight Leg Raises: AROM;Left;10 reps (AAROM R 10) Long Arc Quad: AROM;Strengthening;Both;10 reps Marching  in Standing: AROM;Right;5 reps;Standing Other Exercises Other Exercises: pt performs rolling L and R for LE cleaning and changing Other Exercises: Static sitting and standing for increased activity tolerance    General Comments        Pertinent Vitals/Pain Pain Assessment: 0-10 Pain Score: 2  Pain Location: R Hip Pain Descriptors / Indicators: Discomfort;Moaning;Grimacing Pain Intervention(s): Limited activity within patient's tolerance;Monitored during session;Repositioned    Home Living                      Prior Function            PT Goals (current goals can now be found in the care plan section) Acute Rehab PT Goals Patient Stated Goal: non verbal PT Goal Formulation: Patient unable to participate in goal setting Time For Goal Achievement: 02/09/20 Potential to Achieve Goals: Fair Progress towards PT goals: Progressing toward goals    Frequency    BID      PT Plan Current plan remains appropriate    Co-evaluation              AM-PAC PT "6 Clicks" Mobility   Outcome Measure  Help needed turning from your back to your side while in a flat bed without using bedrails?: A Little Help needed moving from lying on your back to sitting on the side of a flat bed without using bedrails?: A Little Help needed moving to and from a bed to a chair (including a wheelchair)?: A Little Help needed standing up from a chair using your arms (e.g., wheelchair or bedside chair)?: A Little Help needed to walk in hospital room?: A Lot Help needed climbing 3-5 steps with a railing? : Total 6 Click Score: 15    End of Session Equipment Utilized During Treatment: Oxygen;Gait belt Activity Tolerance: Patient limited by pain Patient left: in bed;with call bell/phone within reach;with bed alarm set Nurse Communication: Mobility status Pain - Right/Left: Right Pain - part of body: Hip     Time: 9604-5409 PT Time Calculation (min) (ACUTE ONLY): 55 min  Charges:                         Nicolette Bang, SPT 02/03/20. 11:28 AM

## 2020-02-03 NOTE — Discharge Summary (Addendum)
Physician Discharge Summary  Alice Wallace ONG:[id number] DOB: 1940-11-01 DOA: 01/21/2020  PCP: Devoria Glassing, NP  Admit date: 01/21/2020 Discharge date: 02/03/2020  Admitted From: ALF Disposition:  SNF  Recommendations for Outpatient Follow-up:  Follow up with PCP in 1-2 weeks Please obtain BMP/CBC in one week Please follow up with [name], orthopedics in 1 week Follow up with Dr. [name], cardiology, 1 week after discharge Palliative care to follow in outpatient setting  Home Health: No  Equipment/Devices: None   Discharge Condition: Stable  CODE STATUS: DNR  Diet recommendation: Heart Healthy    Discharge Diagnoses: Principal Problem:   Acute respiratory failure with hypoxia and hypercapnia (HCC) Active Problems:   AKI (acute kidney injury) (HCC)   Chronic obstructive pulmonary disease with acute exacerbation (HCC)   Non-STEMI (non-ST elevated myocardial infarction) (HCC)   Hypokalemia   Hip fracture, unspecified laterality, closed, initial encounter (HCC)   Goals of care, counseling/discussion   Palliative care by specialist   Nonspeaking deaf   Nondisplaced fracture of base of neck of right femur, initial encounter for closed fracture (HCC)   Pressure injury of skin   Malnutrition of moderate degree   Chronic diastolic CHF (congestive heart failure) (HCC)    Summary of HPI and Hospital Course:  [facility] assumed care on 01/28/2020, patient out of ICU.  HPI and Hospital Course summarized below:  79 y.o. Caucasian female with a known history of COPD, hypertension, deafness and muteness, (can read lips and written communications) presented to the ED on 01/21/2020 after having a fall at her assisted living facility with subsequent right hip pain and significant respiratory distress and hypoxia with O2 sat in 70's on her usual 2 L/min oxygen.   Treated with Duoneb, albuterol neb and IV Solu-medrol by EMS for presumed COPD exacerbation.    In the ED, tachycardic HR 106, tachypneic  RR 23, O2 sat 76% on room air, improved to 93-96% on 6 L/min.  CTA was negative for PE, but showed RLL atelectasis vs pneumonia and advanced emphysema.  Imaging consistent with R proximal femur fracture.   She is status post right hip ORIF on 01/26/2020 with [name].  Patient has been working with physical therapy.  To bee seen in follow up in 1 week of discharge.  She developed significant respiratory distress post-operatively, and had ongoing requirement for heated HFNC at rates of 40-50 L/min for several days.  Pulmonology was consulted.          Primary diagnosis - acute respiratory failure with hypoxia and hypercapnea secondary to community-acquired pneumonia.    Acute respiratory failure with hypoxia and hypercapnia - oxygen requirement as baseline 3-4 L/min O2. Present on admission, improved, then recurred post-operatively.  Due to acute heart failure in the setting of non-STEMI with acute pulmonary edema and renal failure, underlying COPD/emphysema.   PE was ruled out by CTA chest, however bone marrow embolism s/p surgery is in the differential (would be supportive care).  Expect prolonged recovery from PNA given severity of her underlying COPD/emphysema. --Continue supplemental oxygen as needed, maintain O2 sat greater than 88%     Community-acquired pneumonia - present on admission, RLL, suspect aspiration.  Initially respiratory failure attributed to cardiac issues so antibiotics were held.  Patient developed significant leukocytosis, and repeat chest xray on 9/7 showed stable bibasilar opacities.  Started on Vanc/Cefepime on 9/7, had initially received couple days Zithromax on admission.  Transitioned to Augmentin once improving.  Completed course of antibiotics during admission.  Clinically improved.  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  Was treated with IV steroids, course  completed. --Continue bronchodilators, Advair, Mucinex --Prednisone taper x 3 days --followed by palliative care    Non-STEMI -present on admission, troponin peaked over 4k and downtrended.  Treating medically, heparin drip completed on 9/3.  Acute diastolic CHF -likely secondary to non-STEMI.  Echo on 9/1 showed EF 60 to 65%, grade 1 diastolic dysfunction, no wall motion abnormalities. 9/9: BNP significantly higher than previous, with continued high oxygen requirement.   Net IO Since Admission: -7,105.09 mL [02/02/20 1436] --Cardiology consulted, signed off 9/7 --Follow-up with Dr. [name] 1 week after discharge --Continue aspirin, Plavix, Lipitor, Imdur --Cardizem CD was reduced to 180 mg --Metoprolol succinate 25 mg daily was added --Continue home PO Lasix 20 mg daily, monitor volume status --DAILY WEIGHTS     Acute anemia superimposed on History of iron deficiency -continue oral iron supplement BID with meals. Trend this admission has been 8's >> up to 11 >> trended back down to 8.  No evidence of bleeding. Anemia panel obtained: iron 50, TIBC 210, ferritin 561, normal folic acid and vit B12 --Monitor CBC in followup --Transfusion threshold is Hbg <8.0 given cardiac issues     Elevated D-dimer - had CTA chest on 9/1 which ruled out PE on admission, is getting Lovenox for VTE prophylaxis.   --9/9 lower extremity venous doppler U/S - negative for DVT bilaterally Most likely related elevated due to her respiratory failure.    Acute Kidney Injury - POA, resolved.  Monitor BMP in follow up.       Right hip fracture -present on admission, sustained during fall at ALF.  Underwent ORIF 01/26/2020 with [name]. --Work with PT --Weightbearing as tolerated --Per Ortho, okay to resume anticoagulation - on ASA/Plavix --Follow up with [name] office, the week of September 13 for staple removal, Call office to confirm appt [phone]     Moderate protein calorie malnutrition -  dietician consulted.  Continue supplemental shakes.     Generalized weakness - PT and OT evaluated, recommending SNF for rehab upon discharge.  Optimize nutrition.   GERD -continue oral Protonix daily   Depression/anxiety -continue Lexapro   Osteoporosis -takes Fosamax weekly.  Continue calcium and vitamin D supplements   Allergic rhinitis -continue loratadine     Sacral pressure injury - not determined if present on admission, reported to attending on 9/12.   Reposition patient at least every 2 hours.  WOC consulted.  Foam sacral dressing, change every 3 days or if soiled.   Pressure Injury 02/01/20 Sacrum Medial Stage 2 -  Partial thickness loss of dermis presenting as a shallow open injury with a red, pink wound bed without slough. (Active)  02/01/20 0900  Location: Sacrum  Location Orientation: Medial  Staging: Stage 2 -  Partial thickness loss of dermis presenting as a shallow open injury with a red, pink wound bed without slough.  Wound Description (Comments):   Present on Admission: No         Discharge Instructions   Discharge Instructions     (HEART FAILURE PATIENTS) Call MD:  Anytime you have any of the following symptoms: 1) 3 pound weight gain in 24 hours or 5 pounds in 1 week 2) shortness of breath, with or without a dry hacking cough 3) swelling in the hands, feet or stomach 4) if you have to sleep on extra pillows at  night in order to breathe.   Complete by: As directed    Call MD for:   Complete by: As directed    Worsening shortness of breath, or requiring increasingly more oxygen to keep oxygen sats above 88%   Call MD for:  extreme fatigue   Complete by: As directed    Call MD for:  persistant dizziness or light-headedness   Complete by: As directed    Call MD for:  persistant nausea and vomiting   Complete by: As directed    Call MD for:  severe uncontrolled pain   Complete by: As directed    Call MD for:  temperature >100.4   Complete by: As  directed    Diet - low sodium heart healthy   Complete by: As directed    Discharge instructions   Complete by: As directed    Continue working with with therapy.   Weight-bearing as tolerated.  Follow up with [name] office in for staple removal in 1 week, Call office to confirm appt [phone]  Continue to use supplemental oxygen as needed to keep oxygen saturation 88-93% or higher.   Discharge wound care:   Complete by: As directed    Sacral pressure injury - foam dressing to sacrum, change every 3 days or as needed if soiled.   Monitor closely.   Increase activity slowly   Complete by: As directed       Allergies as of 02/03/2020       Reactions   Hydrocodone    Other reaction(s): Unknown   Naproxen    Other reaction(s): Unknown   Other Other (See Comments)   nuts   Sulfamethoxazole-trimethoprim    Other reaction(s): Unknown   Tramadol    Other reaction(s): Unknown Pt has tolerated this med 1/5-1/7 with no issues        Medication List     STOP taking these medications    azithromycin 250 MG tablet Commonly known as: ZITHROMAX   metolazone 2.5 MG tablet Commonly known as: ZAROXOLYN       TAKE these medications    acetaminophen 500 MG tablet Commonly known as: TYLENOL Take 500 mg by mouth every 8 (eight) hours as needed for moderate pain or fever. What changed: Another medication with the same name was changed. Make sure you understand how and when to take each.   acetaminophen 325 MG tablet Commonly known as: TYLENOL Take 2 tablets (650 mg total) by mouth every 6 (six) hours. What changed: when to take this   acidophilus Caps capsule Take 1 capsule by mouth 3 (three) times daily.   albuterol 108 (90 Base) MCG/ACT inhaler Commonly known as: VENTOLIN HFA Inhale 2 puffs into the lungs every 4 (four) hours as needed for wheezing or shortness of breath.   albuterol (2.5 MG/3ML) 0.083% nebulizer solution Commonly known as: PROVENTIL Take 2.5  mg by nebulization every 6 (six) hours as needed for wheezing or shortness of breath.   alendronate 70 MG tablet Commonly known as: FOSAMAX Take 70 mg by mouth once a week. Take with a full glass of water on an empty stomach.   aluminum-magnesium hydroxide-simethicone 200-200-20 MG/5ML Susp Commonly known as: MAALOX Take 30 mLs by mouth 4 (four) times daily -  before meals and at bedtime.   Antifungal 1 % cream Generic drug: tolnaftate Apply 1 application topically as needed.   aspirin EC 81 MG tablet Take 81 mg by mouth daily. Swallow whole.   atorvastatin  80 MG tablet Commonly known as: LIPITOR Take 1 tablet (80 mg total) by mouth daily. Start taking on: February 04, 2020   Calcium 600-D 600-400 MG-UNIT Tabs Generic drug: Calcium Carbonate-Vitamin D3 Take 1 tablet by mouth 2 (two) times daily.   calcium-vitamin D 500-200 MG-UNIT tablet Commonly known as: OSCAL WITH D Take 1 tablet by mouth 2 (two) times daily.   cetirizine 10 MG tablet Commonly known as: ZYRTEC Take 10 mg by mouth daily.   clopidogrel 75 MG tablet Commonly known as: PLAVIX Take 1 tablet (75 mg total) by mouth daily. Start taking on: February 04, 2020   diclofenac sodium 1 % Gel Commonly known as: VOLTAREN Apply 2 g topically 3 (three) times daily.   diltiazem 120 MG 24 hr capsule Commonly known as: CARDIZEM CD Take 1 capsule (120 mg total) by mouth daily. Start taking on: February 04, 2020 What changed:  medication strength how much to take   diphenhydrAMINE 25 MG tablet Commonly known as: BENADRYL Take 25 mg by mouth every 6 (six) hours as needed for allergies.   docusate sodium 100 MG capsule Commonly known as: COLACE Take 1 capsule (100 mg total) by mouth 2 (two) times daily.   escitalopram 10 MG tablet Commonly known as: LEXAPRO Take 10 mg by mouth at bedtime.   feeding supplement (ENSURE ENLIVE) Liqd Take 237 mLs by mouth 3 (three) times daily between meals.   ferrous  sulfate 325 (65 FE) MG tablet Take 325 mg by mouth 2 (two) times daily with a meal.   Fluticasone-Salmeterol 100-50 MCG/DOSE Aepb Commonly known as: ADVAIR Inhale 1 puff into the lungs 2 (two) times daily.   furosemide 20 MG tablet Commonly known as: LASIX Take 20 mg by mouth daily.   guaifenesin 100 MG/5ML syrup Commonly known as: ROBITUSSIN Take 300 mg by mouth 4 (four) times daily as needed for cough.   guaiFENesin 600 MG 12 hr tablet Commonly known as: MUCINEX Take 600 mg by mouth daily.   loperamide 2 MG tablet Commonly known as: IMODIUM A-D Take 2 mg by mouth daily as needed for diarrhea or loose stools.   magnesium hydroxide 400 MG/5ML suspension Commonly known as: MILK OF MAGNESIA Take 30 mLs by mouth daily as needed for mild constipation.   metoCLOPramide 5 MG tablet Commonly known as: REGLAN Take 1-2 tablets (5-10 mg total) by mouth every 8 (eight) hours as needed for nausea (if ondansetron (ZOFRAN) ineffective.).   metoprolol succinate 25 MG 24 hr tablet Commonly known as: TOPROL-XL Take 0.5 tablets (12.5 mg total) by mouth daily. Start taking on: February 04, 2020   multivitamin with minerals Tabs tablet Take 1 tablet by mouth daily.   nitroGLYCERIN 0.4 MG SL tablet Commonly known as: NITROSTAT Place 1 tablet (0.4 mg total) under the tongue every 5 (five) minutes as needed for chest pain.   omeprazole 20 MG capsule Commonly known as: PRILOSEC Take 20 mg by mouth daily.   ondansetron 4 MG tablet Commonly known as: ZOFRAN Take 4 mg by mouth every 4 (four) hours as needed for nausea or vomiting. What changed: Another medication with the same name was added. Make sure you understand how and when to take each.   ondansetron 4 MG tablet Commonly known as: ZOFRAN Take 1 tablet (4 mg total) by mouth every 6 (six) hours as needed for nausea. What changed: You were already taking a medication with the same name, and this prescription was added. Make sure you  understand how  and when to take each.   potassium chloride SA 20 MEQ tablet Commonly known as: KLOR-CON Take 40 mEq by mouth daily.   predniSONE 10 MG tablet Commonly known as: DELTASONE Take 3 tablets (30 mg total) by mouth daily with breakfast for 1 day, THEN 2 tablets (20 mg total) daily with breakfast for 1 day, THEN 1 tablet (10 mg total) daily with breakfast for 1 day. Start taking on: February 04, 2020 What changed: See the new instructions.   tiZANidine 4 MG tablet Commonly known as: ZANAFLEX Take 4 mg by mouth daily.   traMADol 50 MG tablet Commonly known as: ULTRAM Take 50 mg by mouth every 8 (eight) hours.   traZODone 50 MG tablet Commonly known as: DESYREL Take 0.5 tablets (25 mg total) by mouth at bedtime as needed for sleep.   Vitamin D 125 MCG (5000 UT) Caps Take 5,000 Units by mouth daily.               Discharge Care Instructions  (From admission, onward)           Start     Ordered   02/03/20 0000  Discharge wound care:       Comments: Sacral pressure injury - foam dressing to sacrum, change every 3 days or as needed if soiled.   Monitor closely.   02/03/20 1319            Contact information for follow-up providers     Paraschos, Alexander, MD. Schedule an appointment as soon as possible for a visit in 2 week(s).   Specialty: Cardiology Contact information: [address] [facility] [place] [address] [phone]              Contact information for after-discharge care     Destination     [facility] Preferred SNF .   Service: Skilled Nursing Contact information: [address] [place] [address] [phone]                    Allergies  Allergen Reactions   Hydrocodone     Other reaction(s): Unknown   Naproxen     Other reaction(s): Unknown   Other Other (See Comments)    nuts   Sulfamethoxazole-Trimethoprim     Other reaction(s):  Unknown   Tramadol     Other reaction(s): Unknown Pt has tolerated this med 1/5-1/7 with no issues    Consultations: Orthopedics Cardiology Pulmonology   Procedures/Studies: DG Chest 2 View  Result Date: 01/27/2020 CLINICAL DATA:  Shortness of breath. EXAM: CHEST - 2 VIEW COMPARISON:  January 26, 2020. FINDINGS: Stable cardiomegaly. Stable old bilateral rib fractures. Severe emphysematous disease is noted in both lungs, right greater than left. No definite pneumothorax is noted. No definite pleural effusion is noted. Stable bibasilar opacities are noted which may represent pneumonia or possibly edema. Small bilateral pleural effusions may be present. IMPRESSION: Stable bibasilar opacities are noted which may represent pneumonia or possibly edema. Stable severe emphysematous disease is noted in both lungs, right greater than left. Small bilateral pleural effusions may be present. Aortic Atherosclerosis (ICD10-I70.0) and Emphysema (ICD10-J43.9). Electronically Signed   By: Lupita Raider M.D.   On: 01/27/2020 13:55   CT Angio Chest PE W/Cm &/Or Wo Cm  Result Date: 01/21/2020 CLINICAL DATA:  Fall and shortness of breath. EXAM: CT ANGIOGRAPHY CHEST WITH CONTRAST TECHNIQUE: Multidetector CT imaging of the chest was performed using the standard protocol during bolus  administration of intravenous contrast. Multiplanar CT image reconstructions and MIPs were obtained to evaluate the vascular anatomy. CONTRAST:  75mL OMNIPAQUE IOHEXOL 350 MG/ML SOLN COMPARISON:  06/09/2019 FINDINGS: Cardiovascular: Prominent right ventricular size. No pericardial effusion. Aortic and great vessel atherosclerotic calcification. No pulmonary artery filling defect is seen. There is significant motion artifact at the lung bases. Mediastinum/Nodes: Small to moderate sliding hiatal hernia. No adenopathy. Lungs/Pleura: Opacity in the right lower lobe with volume loss. No edema, effusion, or pneumothorax. Panlobular emphysema.  Dystrophic calcifications in the apical lungs. Bronchomalacia. Upper Abdomen: Atherosclerotic calcification. Musculoskeletal: Healing anterior and lateral left rib fractures. Review of the MIP images confirms the above findings. IMPRESSION: 1. Right lower lobe atelectasis versus pneumonia. 2. Moderately motion degraded chest CTA. No evidence of pulmonary embolism. 3. Advanced emphysema. Aortic Atherosclerosis (ICD10-I70.0) and Emphysema (ICD10-J43.9). Electronically Signed   By: Marnee SpringJonathon  Watts M.D.   On: 01/21/2020 05:55   CT Hip Right Wo Contrast  Result Date: 01/21/2020 CLINICAL DATA:  Hip trauma.  Fracture suspected. EXAM: CT OF THE RIGHT HIP WITHOUT CONTRAST TECHNIQUE: Multidetector CT imaging of the right hip was performed according to the standard protocol. Multiplanar CT image reconstructions were also generated. COMPARISON:  Hip x-rays from earlier same day. FINDINGS: Bones/Joint/Cartilage Linear lucency identified in the lateral cortex of the proximal right femoral diaphysis associated with focal cortical thickening. Imaging features compatible with nondisplaced stress fracture. The abnormality is included on the extreme inferior aspect of this study and is best visualized on sagittal reconstruction image 21 of series 5. Degenerative changes are noted in the right hip. Ligaments Suboptimally assessed by CT. Muscles and Tendons No substantial hemorrhage noted in the proximal thigh musculature. Soft tissues Distended urinary bladder is incompletely visualized. IMPRESSION: Linear lucency in the lateral cortex of the proximal right femoral diaphysis associated with focal cortical thickening. Imaging features compatible with nondisplaced stress fracture. Consider MRI of the proximal right femur for definitive characterization. Electronically Signed   By: Kennith CenterEric  Mansell M.D.   On: 01/21/2020 06:02   US Venous Img Lower Bilateral (DVT)  Result Date: 01/29/2020 CLINICAL DATA:  Hypoxia.  Elevated D-dimer.  EXAM: BILATERAL LOWER EXTREMITY VENOUS DOPPLER ULTRASOUND TECHNIQUE: Gray-scale sonography with compression, as well as color and duplex ultrasound, were performed to evaluate the deep venous system(s) from the level of the common femoral vein through the popliteal and proximal calf veins. COMPARISON:  None. FINDINGS: VENOUS Normal compressibility of the common femoral, superficial femoral, and popliteal veins, as well as the visualized calf veins. Visualized portions of profunda femoral vein and great saphenous vein unremarkable. No filling defects to suggest DVT on grayscale or color Doppler imaging. Doppler waveforms show normal direction of venous flow, normal respiratory plasticity and response to augmentation. OTHER None. Limitations: none IMPRESSION: Negative. Electronically Signed   By: Katherine Mantlehristopher  Green M.D.   On: 01/29/2020 18:03   DG Chest Port 1 View  Result Date: 01/30/2020 CLINICAL DATA:  Shortness of breath, hypoxemic respiratory failure EXAM: PORTABLE CHEST 1 VIEW COMPARISON:  Multiple prior chest x-rays. FINDINGS: Image rotated to the RIGHT. Accounting for this cardiomediastinal contours are stable as well as hilar structures. Severe bullous changes again noted in the RIGHT upper chest. Background interstitial and airspace opacities are stable and favored to represent multifocal pneumonia. No pleural effusion. Posttraumatic changes about the bilateral chest are again noted with multiple healed rib fractures and signs of previous LEFT thoracotomy. IMPRESSION: No significant change in diffuse interstitial and airspace opacities favored to represent multifocal pneumonia in  this patient with marked emphysema and severe bullous changes in the RIGHT upper lobe in particular. Electronically Signed   By: Donzetta Kohut M.D.   On: 01/30/2020 08:39   DG Chest Port 1 View  Result Date: 01/29/2020 CLINICAL DATA:  79 year old female with history of hypoxia today. EXAM: PORTABLE CHEST 1 VIEW COMPARISON:   Chest x-ray 01/27/2020. FINDINGS: Severe emphysematous changes with extensive bullous disease in the right upper lobe. Widespread areas of interstitial prominence an ill-defined airspace disease noted throughout the mid to lower lungs bilaterally. No definite pleural effusions. No pneumothorax. No definite evidence of pulmonary edema. Mild cardiomegaly. Upper mediastinal contours are distorted by patient positioning. Numerous old healed bilateral rib fractures. IMPRESSION: 1. The appearance the chest is very similar to the prior study, again favored to reflect multilobar pneumonia. 2. Cardiomegaly. 3. Aortic atherosclerosis. 4. Emphysema. Electronically Signed   By: Trudie Reed M.D.   On: 01/29/2020 09:57   DG Chest Port 1 View  Result Date: 01/26/2020 CLINICAL DATA:  Shortness of breath. EXAM: PORTABLE CHEST 1 VIEW COMPARISON:  January 24, 2020 FINDINGS: Stably enlarged cardiac silhouette. Calcific atherosclerotic disease of the aorta. Upper lobe predominant moderate to severe emphysematous changes. Persistent patchy interstitial and alveolar opacities in bilateral lower lobes. No evidence of pneumothorax or pleural effusion. Stable chest wall deformity from prior trauma. IMPRESSION: 1. Persistent patchy interstitial and alveolar opacities in bilateral lower lobes may represent mixed pattern pulmonary edema or bronchopneumonia. 2. Moderate to severe emphysema. 3. Stably enlarged cardiac silhouette. Electronically Signed   By: Ted Mcalpine M.D.   On: 01/26/2020 09:15   DG Chest Port 1 View  Result Date: 01/24/2020 CLINICAL DATA:  COPD exacerbation. Pt presented 01/21/2020 to the emergency room with acute onset of fall at her assisted living facility with subsequent right hip pain and significant respiratory distress. Hx - COPD, HTN, non-smoker. EXAM: PORTABLE CHEST 1 VIEW COMPARISON:  01/21/2020 FINDINGS: Changes of emphysema skeletal lung scarring and interstitial thickening are stable. Opacity at  the right lung base has mildly improved from the previous study. No new lung abnormalities. No convincing pleural effusion and no pneumothorax. Cardiac silhouette is normal in size. Multiple bilateral old rib fractures are stable. IMPRESSION: 1. Mild interval improvement. Decreased opacity at the right lung base consistent with improved pneumonia or atelectasis. 2. No other change. Stable chronic findings including extensive emphysema. Electronically Signed   By: Amie Portland M.D.   On: 01/24/2020 10:43   DG Chest Portable 1 View  Result Date: 01/21/2020 CLINICAL DATA:  Larey Seat, short of breath, hypoxia EXAM: PORTABLE CHEST 1 VIEW COMPARISON:  06/07/2019 FINDINGS: Single frontal view of the chest demonstrates a stable cardiac silhouette. Extensive bullous emphysematous changes are seen, greatest at the right apex. Marked areas of scarring and fibrosis are seen throughout the lungs. Increased density at the right costophrenic angle may reflect consolidation and/or effusion. Chronic posttraumatic changes of the thoracic cage. No acute displaced fractures. IMPRESSION: 1. 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 2. Density at the right lateral lung base may reflect focal lung consolidation and/or small effusion. Electronically Signed   By: Sharlet Salina M.D.   On: 01/21/2020 03:48   ECHOCARDIOGRAM COMPLETE  Result Date: 01/21/2020    ECHOCARDIOGRAM REPORT   Patient Name:   ANTAVIA TANDY Date of Exam: 01/21/2020 Medical Rec #:  [id number]     Height:       66.0 in Accession #:    [id number]    Weight:  123.2 lb Date of Birth:  10-05-1940      BSA:          1.628 m Patient Age:    79 years      BP:           116/63 mmHg Patient Gender: F             HR:           74 bpm. Exam Location:  [facility] Procedure: 2D Echo, Cardiac Doppler and Color Doppler Indications:     NSTEMI 121.4  History:         Patient has no prior history of Echocardiogram examinations.                  COPD; Risk  Factors:Hypertension.  Sonographer:     Cristela Blue RDCS (AE) Referring Phys:  [id number] Vernetta Honey MANSY Diagnosing Phys: Debbe Odea MD IMPRESSIONS  1. Left ventricular ejection fraction, by estimation, is 60 to 65%. The left ventricle has normal function. The left ventricle has no regional wall motion abnormalities. Left ventricular diastolic parameters are consistent with Grade I diastolic dysfunction (impaired relaxation).  2. Right ventricular systolic function is normal. The right ventricular size is normal.  3. Left atrial size was moderately dilated.  4. The mitral valve is normal in structure. No evidence of mitral valve regurgitation. No evidence of mitral stenosis.  5. The aortic valve is normal in structure. Aortic valve regurgitation is not visualized. No aortic stenosis is present.  6. The inferior vena cava is normal in size with greater than 50% respiratory variability, suggesting right atrial pressure of 3 mmHg. FINDINGS  Left Ventricle: Left ventricular ejection fraction, by estimation, is 60 to 65%. The left ventricle has normal function. The left ventricle has no regional wall motion abnormalities. The left ventricular internal cavity size was normal in size. There is  no left ventricular hypertrophy. Left ventricular diastolic parameters are consistent with Grade I diastolic dysfunction (impaired relaxation). Right Ventricle: The right ventricular size is normal. No increase in right ventricular wall thickness. Right ventricular systolic function is normal. Left Atrium: Left atrial size was moderately dilated. Right Atrium: Right atrial size was normal in size. Pericardium: There is no evidence of pericardial effusion. Mitral Valve: The mitral valve is normal in structure. Normal mobility of the mitral valve leaflets. No evidence of mitral valve regurgitation. No evidence of mitral valve stenosis. Tricuspid Valve: The tricuspid valve is normal in structure. Tricuspid valve regurgitation is not  demonstrated. No evidence of tricuspid stenosis. Aortic Valve: The aortic valve is normal in structure. Aortic valve regurgitation is not visualized. No aortic stenosis is present. Aortic valve mean gradient measures 3.5 mmHg. Aortic valve peak gradient measures 6.5 mmHg. Aortic valve area, by VTI measures 2.46 cm. Pulmonic Valve: The pulmonic valve was not well visualized. Pulmonic valve regurgitation is not visualized. No evidence of pulmonic stenosis. Aorta: The aortic root is normal in size and structure. Venous: The inferior vena cava was not well visualized. The inferior vena cava is normal in size with greater than 50% respiratory variability, suggesting right atrial pressure of 3 mmHg. IAS/Shunts: No atrial level shunt detected by color flow Doppler.  LEFT VENTRICLE PLAX 2D LVIDd:         4.77 cm  Diastology LVIDs:         3.08 cm  LV e' lateral:   7.83 cm/s LV PW:         1.15 cm  LV  E/e' lateral: 11.6 LV IVS:        1.11 cm  LV e' medial:    5.87 cm/s LVOT diam:     2.00 cm  LV E/e' medial:  15.5 LV SV:         68 LV SV Index:   42 LVOT Area:     3.14 cm  RIGHT VENTRICLE RV Basal diam:  3.63 cm RV S prime:     12.40 cm/s TAPSE (M-mode): 3.6 cm LEFT ATRIUM             Index       RIGHT ATRIUM           Index LA diam:        4.80 cm 2.95 cm/m  RA Area:     15.10 cm LA Vol (A2C):   83.7 ml 51.42 ml/m RA Volume:   35.80 ml  21.99 ml/m LA Vol (A4C):   96.5 ml 59.28 ml/m LA Biplane Vol: 91.0 ml 55.90 ml/m  AORTIC VALVE                   PULMONIC VALVE AV Area (Vmax):    2.25 cm    PV Vmax:        0.92 m/s AV Area (Vmean):   2.51 cm    PV Peak grad:   3.4 mmHg AV Area (VTI):     2.46 cm    RVOT Peak grad: 5 mmHg AV Vmax:           127.00 cm/s AV Vmean:          86.450 cm/s AV VTI:            0.277 m AV Peak Grad:      6.5 mmHg AV Mean Grad:      3.5 mmHg LVOT Vmax:         90.90 cm/s LVOT Vmean:        69.000 cm/s LVOT VTI:          0.217 m LVOT/AV VTI ratio: 0.78  AORTA Ao Root diam: 3.50 cm MITRAL  VALVE                TRICUSPID VALVE MV Area (PHT): 3.34 cm     TR Peak grad:   30.7 mmHg MV Decel Time: 227 msec     TR Vmax:        277.00 cm/s MV E velocity: 91.20 cm/s MV A velocity: 137.00 cm/s  SHUNTS MV E/A ratio:  0.67         Systemic VTI:  0.22 m                             Systemic Diam: 2.00 cm Debbe Odea MD Electronically signed by Debbe Odea MD Signature Date/Time: 01/21/2020/4:26:00 PM    Final    DG HIP OPERATIVE UNILAT W OR W/O PELVIS RIGHT  Result Date: 01/26/2020 CLINICAL DATA:  Portable operative images for right proximal femur ORIF. EXAM: OPERATIVE RIGHT HIP (WITH PELVIS IF PERFORMED) 5 VIEWS TECHNIQUE: Fluoroscopic spot image(s) were submitted for interpretation post-operatively. COMPARISON:  01/25/2020 FINDINGS: Five spot fluoro graphic operative images show placement of a long medullary rod through the femur supporting a compression screw. The proximal femur fracture components have been reduced, with the distal fracture component minimally displaced medial compared to the proximal component by 6-7 mm. Orthopedic hardware appears well seated and aligned. IMPRESSION: 1.  Significant reduction of the proximal right femur fracture following ORIF. Electronically Signed   By: Amie Portland M.D.   On: 01/26/2020 18:41   DG HIP UNILAT WITH PELVIS 2-3 VIEWS RIGHT  Result Date: 01/25/2020 CLINICAL DATA:  Dislocated hip, right, initial encounter. Worsening right hip pain. EXAM: DG HIP (WITH OR WITHOUT PELVIS) 2-3V RIGHT COMPARISON:  Radiograph and CT 01/21/2020, radiograph 07/04/2019 FINDINGS: The previous incomplete subtrochanteric fracture of the right hip is now a complete fracture, displaced and significantly angulated with apex anterior angulation of 79 degrees. Fracture may be bisphosphonate related with cortical thickening noted on February 2021 exam. The femoral head remains seated in the acetabulum without dislocation. Moderate right hip osteoarthritis. Pubic rami are intact.  IMPRESSION: 1. The previous incomplete subtrochanteric fracture is now a complete fracture, displaced with significant angulation. 2. Femoral head is well seated in the acetabulum without dislocation. Electronically Signed   By: Narda Rutherford M.D.   On: 01/25/2020 17:15   DG Hip Unilat W or Wo Pelvis 2-3 Views Right  Result Date: 01/21/2020 CLINICAL DATA:  Larey Seat, pain EXAM: DG HIP (WITH OR WITHOUT PELVIS) 2-3V RIGHT COMPARISON:  01/31/2017 FINDINGS: Frontal view of the pelvis as well as frontal and cross-table lateral views of the right hip are obtained. There is a nondisplaced transverse fracture through the proximal femoral diaphysis. Alignment is anatomic. There are no other acute displaced fractures. The hips are well aligned. Mild bilateral hip osteoarthritis. Sacroiliac joints are normal. IMPRESSION: 1. Nondisplaced transverse fracture through the proximal right femoral diaphysis. Fracture line involves an area of chronic focal cortical thickening, and likely reflects an acute fracture at a site of chronic stress. Electronically Signed   By: Sharlet Salina M.D.   On: 01/21/2020 03:46      Subjective: Patient seen this AM at bedside.  Written communication used.  She reports feeling better.  Denies pain when at rest, some with PT but improving.  Breathing and cough better.  No acute events reported.  Happy to be getting out of the hospital.   Discharge Exam: Vitals:   02/03/20 1119 02/03/20 1127  BP: (!) 119/56 (!) 116/51  Pulse: 85   Resp: 18   Temp: 98.3 F (36.8 C)   SpO2: 99%    Vitals:   02/03/20 0824 02/03/20 0846 02/03/20 1119 02/03/20 1127  BP:  (!) 108/56 (!) 119/56 (!) 116/51  Pulse:  80 85   Resp:  18 18   Temp:  98.5 F (36.9 C) 98.3 F (36.8 C)   TempSrc:  Oral Oral   SpO2: 97% 97% 99%   Weight:      Height:        General: Pt is alert, awake, not in acute distress, frail Cardiovascular: RRR, S1/S2 +, no rubs, no gallops Respiratory: diminished but CTA  bilaterally, no wheezing, no rhonchi Abdominal: Soft, NT, ND, bowel sounds + Extremities: no edema, no cyanosis    The results of significant diagnostics from this hospitalization (including imaging, microbiology, ancillary and laboratory) are listed below for reference.     Microbiology: Recent Results (from the past 240 hour(s))  CULTURE, BLOOD (ROUTINE X 2) w Reflex to ID Panel     Status: None   Collection Time: 01/26/20  9:17 AM   Specimen: BLOOD  Result Value Ref Range Status   Specimen Description BLOOD LEFT ANTECUBITAL  Final   Special Requests   Final    BOTTLES DRAWN AEROBIC AND ANAEROBIC Blood Culture adequate volume   Culture  Final    NO GROWTH 5 DAYS Performed at [facility], [address] [place], [place] [address]    Report Status 01/31/2020 FINAL  Final  CULTURE, BLOOD (ROUTINE X 2) w Reflex to ID Panel     Status: None   Collection Time: 01/26/20  9:22 AM   Specimen: BLOOD  Result Value Ref Range Status   Specimen Description BLOOD RIGHT ANTECUBITAL  Final   Special Requests   Final    BOTTLES DRAWN AEROBIC AND ANAEROBIC Blood Culture adequate volume   Culture   Final    NO GROWTH 5 DAYS Performed at [facility], [address] [place], [place] [address]    Report Status 01/31/2020 FINAL  Final  Urine Culture     Status: Abnormal   Collection Time: 01/27/20  1:14 PM   Specimen: Urine, Random  Result Value Ref Range Status   Specimen Description   Final    URINE, RANDOM Performed at [facility], [address], [place], [place] [address]    Special Requests   Final    NONE Performed at [facility], [address] [place], [place] [address]    Culture 80,000 COLONIES/mL ENTEROCOCCUS RAFFINOSUS (A)  Final   Report Status 01/29/2020 FINAL  Final   Organism ID, Bacteria ENTEROCOCCUS RAFFINOSUS (A)  Final      Susceptibility   Enterococcus raffinosus - MIC*    AMPICILLIN 16 RESISTANT Resistant      NITROFURANTOIN 32 SENSITIVE Sensitive     VANCOMYCIN <=0.5 SENSITIVE Sensitive     * 80,000 COLONIES/mL ENTEROCOCCUS RAFFINOSUS  MRSA PCR Screening     Status: None   Collection Time: 01/30/20 10:08 AM   Specimen: Nasal Mucosa; Nasopharyngeal  Result Value Ref Range Status   MRSA by PCR NEGATIVE NEGATIVE Final    Comment:        The GeneXpert MRSA Assay (FDA approved for NASAL specimens only), is one component of a comprehensive MRSA colonization surveillance program. It is not intended to diagnose MRSA infection nor to guide or monitor treatment for MRSA infections. Performed at [facility], [address] [place], [place] [address]      Labs: BNP (last 3 results) Recent Labs    01/21/20 0253 01/24/20 0427 01/29/20 1029  BNP 191.6* 797.4* 2,160.7*   Basic Metabolic Panel: Recent Labs  Lab 01/29/20 0622 01/29/20 0622 01/30/20 0421 01/31/20 0337 02/01/20 0348 02/02/20 0500 02/03/20 0719  NA 134*   < > 133* 134* 136 136 136  K 3.8   < > 3.7 3.2* 4.2 4.2 4.1  CL 92*   < > 92* 91* 95* 97* 98  CO2 31   < > 32 32 33* 31 29  GLUCOSE 126*   < > 114* 102* 98 85 86  BUN 37*   < > 38* 33* 30* 29* 23  CREATININE 0.86   < > 0.88 0.80 0.69 0.82 0.57  CALCIUM 8.2*   < > 7.9* 8.2* 8.5* 8.6* 8.4*  MG 2.8*  --   --   --   --   --  2.3   < > = values in this interval not displayed.   Liver Function Tests: No results for input(s): AST, ALT, ALKPHOS, BILITOT, PROT, ALBUMIN in the last 168 hours. No results for input(s): LIPASE, AMYLASE in the last 168 hours. No results for input(s): AMMONIA in the last 168 hours. CBC: Recent Labs  Lab 01/28/20 0422 01/28/20 0422 01/29/20 1779  01/30/20 0421 01/31/20 0337 02/02/20 0500 02/03/20 0719  WBC 25.8*   < > 25.2* 20.4* 21.2* 18.4* 19.1*  NEUTROABS 23.7*  --  23.2* 18.1*  --   --   --   HGB 8.5*   < > 8.8* 8.0* 8.0* 8.3* 8.6*  HCT 25.9*   < > 26.3* 23.8* 23.8* 24.7* 26.9*  MCV 92.2   < > 92.3 91.2 89.1 88.8 93.4   PLT 263   < > 253 240 246 269 282   < > = values in this interval not displayed.   Cardiac Enzymes: No results for input(s): CKTOTAL, CKMB, CKMBINDEX, TROPONINI in the last 168 hours. BNP: Invalid input(s): POCBNP CBG: Recent Labs  Lab 01/30/20 0605  GLUCAP 112*   D-Dimer No results for input(s): DDIMER in the last 72 hours. Hgb A1c No results for input(s): HGBA1C in the last 72 hours. Lipid Profile No results for input(s): CHOL, HDL, LDLCALC, TRIG, CHOLHDL, LDLDIRECT in the last 72 hours. Thyroid function studies No results for input(s): TSH, T4TOTAL, T3FREE, THYROIDAB in the last 72 hours.  Invalid input(s): FREET3 Anemia work up Recent Labs    02/02/20 0500  VITAMINB12 411  FOLATE 6.6  FERRITIN 561*  TIBC 210*  IRON 50  RETICCTPCT 4.1*   Urinalysis    Component Value Date/Time   COLORURINE YELLOW (A) 01/27/2020 1314   APPEARANCEUR HAZY (A) 01/27/2020 1314   LABSPEC 1.017 01/27/2020 1314   PHURINE 5.0 01/27/2020 1314   GLUCOSEU NEGATIVE 01/27/2020 1314   HGBUR LARGE (A) 01/27/2020 1314   BILIRUBINUR NEGATIVE 01/27/2020 1314   KETONESUR NEGATIVE 01/27/2020 1314   PROTEINUR 30 (A) 01/27/2020 1314   NITRITE NEGATIVE 01/27/2020 1314   LEUKOCYTESUR TRACE (A) 01/27/2020 1314   Sepsis Labs Invalid input(s): PROCALCITONIN,  WBC,  LACTICIDVEN Microbiology Recent Results (from the past 240 hour(s))  CULTURE, BLOOD (ROUTINE X 2) w Reflex to ID Panel     Status: None   Collection Time: 01/26/20  9:17 AM   Specimen: BLOOD  Result Value Ref Range Status   Specimen Description BLOOD LEFT ANTECUBITAL  Final   Special Requests   Final    BOTTLES DRAWN AEROBIC AND ANAEROBIC Blood Culture adequate volume   Culture   Final    NO GROWTH 5 DAYS Performed at [facility], [address] [place], [place] [address]    Report Status 01/31/2020 FINAL  Final  CULTURE, BLOOD (ROUTINE X 2) w Reflex to ID Panel     Status: None   Collection Time: 01/26/20  9:22 AM    Specimen: BLOOD  Result Value Ref Range Status   Specimen Description BLOOD RIGHT ANTECUBITAL  Final   Special Requests   Final    BOTTLES DRAWN AEROBIC AND ANAEROBIC Blood Culture adequate volume   Culture   Final    NO GROWTH 5 DAYS Performed at [facility], [address], [place], [place] [address]    Report Status 01/31/2020 FINAL  Final  Urine Culture     Status: Abnormal   Collection Time: 01/27/20  1:14 PM   Specimen: Urine, Random  Result Value Ref Range Status   Specimen Description   Final    URINE, RANDOM Performed at [facility], [address] [place], [place] [address]    Special Requests   Final    NONE Performed at [facility], [address] [place], [place] [address]    Culture 80,000 COLONIES/mL ENTEROCOCCUS RAFFINOSUS (A)  Final  Report Status 01/29/2020 FINAL  Final   Organism ID, Bacteria ENTEROCOCCUS RAFFINOSUS (A)  Final      Susceptibility   Enterococcus raffinosus - MIC*    AMPICILLIN 16 RESISTANT Resistant     NITROFURANTOIN 32 SENSITIVE Sensitive     VANCOMYCIN <=0.5 SENSITIVE Sensitive     * 80,000 COLONIES/mL ENTEROCOCCUS RAFFINOSUS  MRSA PCR Screening     Status: None   Collection Time: 01/30/20 10:08 AM   Specimen: Nasal Mucosa; Nasopharyngeal  Result Value Ref Range Status   MRSA by PCR NEGATIVE NEGATIVE Final    Comment:        The GeneXpert MRSA Assay (FDA approved for NASAL specimens only), is one component of a comprehensive MRSA colonization surveillance program. It is not intended to diagnose MRSA infection nor to guide or monitor treatment for MRSA infections. Performed at [facility], [address] [place], [place] [address]      Time coordinating discharge: Over 30 minutes  SIGNED:   Pennie Banter, DO [facility] 02/03/2020, 1:31 PM   If 7PM-7AM, please contact night-coverage [email or web address]

## 2020-02-03 NOTE — Progress Notes (Signed)
Pulmonary Medicine          Date: 02/03/2020,   MRN# [id number] Alice Wallace 1941/03/27     AdmissionWeight: 55.9 kg                 CurrentWeight: 42 kg  Referring physician: Dr Denton Lank    CHIEF COMPLAINT:   Acute hypoxemic respiratory failure   HISTORY OF PRESENT ILLNESS and SUBJECTIVE FINDINGS   79 yo F with hx of COPD, HTN, bilateral hearing loss comes from assisted living with respiratory distress after mechanical fall to right hip.  She was noted to be acutely hypoxemic with sPO2<75% on 2L/min [place].  She was treated with typical COPD care path. She had CTPE which was negative for VTE.  She was found to have acute Right proximal femoral fracture. Post operatively she has increased O2 requirement.  02/01/20- Patient examined at bedside, net fluid bal 6.5L negative, mild decrement in h/h to 8 despite negative fluid balance which is concerning may need transfusion. 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    02/02/20- patient is resting in bed comfortably.  She is eating breakfast and smiling during interview and exam.  She is now close to baseline and breath sounds are non-ronchorous. Have dcd solumedrol and started pred 40 with plan to taper by 10mg  daily. Will sign off today, patient may follow up with me in clinic as she is doing well and is now at home setting of 3-4L/min.  Please continue augmentin at current dose for total of 10 days of PO therapy.   02/03/20- patient resting in bed having lunch.  Complains of right knee pain, I examined her leg there is no erythema or swelling.   Breathing is improved , O 2 at baseline.   02/02/20/   PAST MEDICAL HISTORY   Past Medical History:  Diagnosis Date  . COPD (chronic obstructive pulmonary disease) (HCC)   . Deaf   . Hypertension   . Osteoporosis      SURGICAL HISTORY   Past Surgical  History:  Procedure Laterality Date  . INTRAMEDULLARY (IM) NAIL INTERTROCHANTERIC Right 01/26/2020   Procedure: Right Hip IM nail with TFNA;  Surgeon: Lyndle Herrlich, MD;  Location: [facility] ORS;  Service: Orthopedics;  Laterality: Right;     FAMILY HISTORY   History reviewed. No pertinent family history.   SOCIAL HISTORY   Social History   Tobacco Use  . Smoking status: Never Smoker  . Smokeless tobacco: Never Used  Substance Use Topics  . Alcohol use: Not Currently  . Drug use: Not Currently     MEDICATIONS    Home Medication:    Current Medication:  Current Facility-Administered Medications:  .  acetaminophen (TYLENOL) tablet 650 mg, 650 mg, Oral, Q6H, 650 mg at 02/03/20 1114 **OR** acetaminophen (TYLENOL) suppository 650 mg, 650 mg, Rectal, Q6H, Griffith, Kelly A, DO .  acidophilus (RISAQUAD) capsule 1 capsule, 1 capsule, Oral, TID, Shahmehdi, Seyed A, MD, 1 capsule at 02/03/20 1108 .  albuterol (PROVENTIL) (2.5 MG/3ML) 0.083% nebulizer solution 3 mL, 3 mL, Inhalation, Q4H PRN, Lyndle Herrlich, MD .  alum & mag hydroxide-simeth (MAALOX/MYLANTA) 200-200-20 MG/5ML suspension 30 mL, 30 mL, Oral, TID AC & HS, Lyndle Herrlich, MD, 30 mL at 02/03/20 1113 .  aspirin EC tablet 81 mg, 81 mg, Oral, Daily, Lyndle Herrlich, MD, 81 mg at 02/03/20 1109 .  atorvastatin (LIPITOR)  tablet 80 mg, 80 mg, Oral, Daily, Lyndle HerrlichBowers, James R, MD, 80 mg at 02/03/20 1109 .  calcium-vitamin D (OSCAL WITH D) 500-200 MG-UNIT per tablet 1 tablet, 1 tablet, Oral, BID, Lyndle HerrlichBowers, James R, MD, 1 tablet at 02/03/20 1109 .  cholecalciferol (VITAMIN D3) tablet 5,000 Units, 5,000 Units, Oral, Daily, Lyndle HerrlichBowers, James R, MD, 5,000 Units at 02/03/20 1108 .  clopidogrel (PLAVIX) tablet 75 mg, 75 mg, Oral, Daily, Leanora IvanoffDrane, Anna, PA-C, 75 mg at 02/03/20 1109 .  dextromethorphan (DELSYM) 30 MG/5ML liquid 30 mg, 30 mg, Oral, QHS PRN, Lyndle HerrlichBowers, James R, MD, 30 mg at 01/25/20 0858 .  diltiazem (CARDIZEM CD) 24 hr capsule 120 mg, 120 mg,  Oral, Daily, Jayceon Troy, MD, 120 mg at 02/03/20 1108 .  docusate sodium (COLACE) capsule 100 mg, 100 mg, Oral, BID, Lyndle HerrlichBowers, James R, MD, 100 mg at 02/02/20 2201 .  enoxaparin (LOVENOX) injection 40 mg, 40 mg, Subcutaneous, Q24H, Shahmehdi, Seyed A, MD, 40 mg at 02/02/20 2201 .  escitalopram (LEXAPRO) tablet 10 mg, 10 mg, Oral, Daily, Lyndle HerrlichBowers, James R, MD, 10 mg at 02/03/20 1109 .  feeding supplement (ENSURE ENLIVE) (ENSURE ENLIVE) liquid 237 mL, 237 mL, Oral, TID BM, Lyndle HerrlichBowers, James R, MD, 237 mL at 01/31/20 2025 .  ferrous sulfate tablet 325 mg, 325 mg, Oral, BID WC, Lyndle HerrlichBowers, James R, MD, 325 mg at 02/03/20 1114 .  furosemide (LASIX) tablet 20 mg, 20 mg, Oral, Daily, Esaw GrandchildGriffith, Kelly A, DO, 20 mg at 02/03/20 1109 .  guaiFENesin (MUCINEX) 12 hr tablet 600 mg, 600 mg, Oral, Daily, Lyndle HerrlichBowers, James R, MD, 600 mg at 02/03/20 1110 .  HYDROmorphone (DILAUDID) injection 1 mg, 1 mg, Intravenous, Q3H PRN, Lyndle HerrlichBowers, James R, MD, 1 mg at 01/31/20 1457 .  ipratropium-albuterol (DUONEB) 0.5-2.5 (3) MG/3ML nebulizer solution 3 mL, 3 mL, Nebulization, TID, Lyndle HerrlichBowers, James R, MD, 3 mL at 02/03/20 0823 .  loperamide (IMODIUM) capsule 2 mg, 2 mg, Oral, Daily PRN, Lyndle HerrlichBowers, James R, MD .  loratadine (CLARITIN) tablet 10 mg, 10 mg, Oral, Daily, Lyndle HerrlichBowers, James R, MD, 10 mg at 02/03/20 1108 .  magnesium hydroxide (MILK OF MAGNESIA) suspension 30 mL, 30 mL, Oral, Daily PRN, Lyndle HerrlichBowers, James R, MD .  metoCLOPramide (REGLAN) tablet 5-10 mg, 5-10 mg, Oral, Q8H PRN **OR** metoCLOPramide (REGLAN) injection 5-10 mg, 5-10 mg, Intravenous, Q8H PRN, Lyndle HerrlichBowers, James R, MD .  metoprolol succinate (TOPROL-XL) 24 hr tablet 12.5 mg, 12.5 mg, Oral, Daily, Karna ChristmasAleskerov, Selby Slovacek, MD, 12.5 mg at 02/03/20 1110 .  morphine 2 MG/ML injection 2 mg, 2 mg, Intravenous, Q4H PRN, Esaw GrandchildGriffith, Kelly A, DO, 2 mg at 02/02/20 0432 .  multivitamin with minerals tablet 1 tablet, 1 tablet, Oral, Daily, Esaw GrandchildGriffith, Kelly A, DO, 1 tablet at 02/03/20 1109 .  nitroGLYCERIN  (NITROSTAT) SL tablet 0.4 mg, 0.4 mg, Sublingual, Q5 min PRN, Lyndle HerrlichBowers, James R, MD, 0.4 mg at 01/24/20 0920 .  ondansetron (ZOFRAN) tablet 4 mg, 4 mg, Oral, Q6H PRN **OR** ondansetron (ZOFRAN) injection 4 mg, 4 mg, Intravenous, Q6H PRN, Lyndle HerrlichBowers, James R, MD, 4 mg at 01/26/20 2158 .  ondansetron (ZOFRAN) tablet 4 mg, 4 mg, Oral, Q6H PRN **OR** ondansetron (ZOFRAN) injection 4 mg, 4 mg, Intravenous, Q6H PRN, Lyndle HerrlichBowers, James R, MD .  pantoprazole (PROTONIX) EC tablet 40 mg, 40 mg, Oral, Daily, Lyndle HerrlichBowers, James R, MD, 40 mg at 02/03/20 1114 .  potassium chloride SA (KLOR-CON) CR tablet 40 mEq, 40 mEq, Oral, Daily, Lyndle HerrlichBowers, James R, MD, 40 mEq at 02/03/20 1109 .  [START ON  02/04/2020] predniSONE (DELTASONE) tablet 30 mg, 30 mg, Oral, Q breakfast **FOLLOWED BY** [START ON 02/05/2020] predniSONE (DELTASONE) tablet 20 mg, 20 mg, Oral, Q breakfast **FOLLOWED BY** [START ON 02/06/2020] predniSONE (DELTASONE) tablet 10 mg, 10 mg, Oral, Q breakfast, Esaw Grandchild A, DO .  tiZANidine (ZANAFLEX) tablet 4 mg, 4 mg, Oral, Q8H PRN, Lyndle Herrlich, MD, 4 mg at 02/01/20 1602 .  traZODone (DESYREL) tablet 25 mg, 25 mg, Oral, QHS PRN, Lyndle Herrlich, MD    ALLERGIES   Hydrocodone, Naproxen, Other, Sulfamethoxazole-trimethoprim, and Tramadol     REVIEW OF SYSTEMS    Review of Systems:  Gen:  Denies  fever, sweats, chills weigh loss  HEENT: Denies blurred vision, double vision, ear pain, eye pain, hearing loss, nose bleeds, sore throat Cardiac:  No dizziness, chest pain or heaviness, chest tightness,edema Resp:   Denies cough or sputum porduction, shortness of breath,wheezing, hemoptysis,  Gi: Denies swallowing difficulty, stomach pain, nausea or vomiting, diarrhea, constipation, bowel incontinence Gu:  Denies bladder incontinence, burning urine Ext:   Denies Joint pain, stiffness or swelling Skin: Denies  skin rash, easy bruising or bleeding or hives Endoc:  Denies polyuria, polydipsia , polyphagia or weight  change Psych:   Denies depression, insomnia or hallucinations   Other:  All other systems negative   VS: BP (!) 116/51   Pulse 85   Temp 98.3 F (36.8 C) (Oral)   Resp 18   Ht 5\' 6"  (1.676 m)   Wt 42 kg   SpO2 99%   BMI 14.94 kg/m      PHYSICAL EXAM    GENERAL:NAD, no fevers, chills, no weakness no fatigue HEAD: Normocephalic, atraumatic.  EYES: Pupils equal, round, reactive to light. Extraocular muscles intact. No scleral icterus.  MOUTH: Moist mucosal membrane. Dentition intact. No abscess noted.  EAR, NOSE, THROAT: Clear without exudates. No external lesions.  NECK: Supple. No thyromegaly. No nodules. No JVD.  PULMONARY: decreased air entry bilaterally with clear lung sounds CARDIOVASCULAR: S1 and S2. Regular rate and rhythm. No murmurs, rubs, or gallops. No edema. Pedal pulses 2+ bilaterally.  GASTROINTESTINAL: Soft, nontender, nondistended. No masses. Positive bowel sounds. No hepatosplenomegaly.  MUSCULOSKELETAL: No swelling, clubbing, or edema. Range of motion full in all extremities.  NEUROLOGIC: Cranial nerves II through XII are intact. No gross focal neurological deficits. Sensation intact. Reflexes intact.  SKIN: No ulceration, lesions, rashes, or cyanosis. Skin warm and dry. Turgor intact.  PSYCHIATRIC: Mood, affect within normal limits. The patient is awake, alert and oriented x 3. Insight, judgment intact.       IMAGING    DG Chest 2 View  Result Date: 01/27/2020 CLINICAL DATA:  Shortness of breath. EXAM: CHEST - 2 VIEW COMPARISON:  January 26, 2020. FINDINGS: Stable cardiomegaly. Stable old bilateral rib fractures. Severe emphysematous disease is noted in both lungs, right greater than left. No definite pneumothorax is noted. No definite pleural effusion is noted. Stable bibasilar opacities are noted which may represent pneumonia or possibly edema. Small bilateral pleural effusions may be present. IMPRESSION: Stable bibasilar opacities are noted which may  represent pneumonia or possibly edema. Stable severe emphysematous disease is noted in both lungs, right greater than left. Small bilateral pleural effusions may be present. Aortic Atherosclerosis (ICD10-I70.0) and Emphysema (ICD10-J43.9). Electronically Signed   By: January 28, 2020 M.D.   On: 01/27/2020 13:55   CT Angio Chest PE W/Cm &/Or Wo Cm  Result Date: 01/21/2020 CLINICAL DATA:  Fall and shortness of breath. EXAM: CT  ANGIOGRAPHY CHEST WITH CONTRAST TECHNIQUE: Multidetector CT imaging of the chest was performed using the standard protocol during bolus administration of intravenous contrast. Multiplanar CT image reconstructions and MIPs were obtained to evaluate the vascular anatomy. CONTRAST:  75mL OMNIPAQUE IOHEXOL 350 MG/ML SOLN COMPARISON:  06/09/2019 FINDINGS: Cardiovascular: Prominent right ventricular size. No pericardial effusion. Aortic and great vessel atherosclerotic calcification. No pulmonary artery filling defect is seen. There is significant motion artifact at the lung bases. Mediastinum/Nodes: Small to moderate sliding hiatal hernia. No adenopathy. Lungs/Pleura: Opacity in the right lower lobe with volume loss. No edema, effusion, or pneumothorax. Panlobular emphysema. Dystrophic calcifications in the apical lungs. Bronchomalacia. Upper Abdomen: Atherosclerotic calcification. Musculoskeletal: Healing anterior and lateral left rib fractures. Review of the MIP images confirms the above findings. IMPRESSION: 1. Right lower lobe atelectasis versus pneumonia. 2. Moderately motion degraded chest CTA. No evidence of pulmonary embolism. 3. Advanced emphysema. Aortic Atherosclerosis (ICD10-I70.0) and Emphysema (ICD10-J43.9). Electronically Signed   By: Marnee Spring M.D.   On: 01/21/2020 05:55   CT Hip Right Wo Contrast  Result Date: 01/21/2020 CLINICAL DATA:  Hip trauma.  Fracture suspected. EXAM: CT OF THE RIGHT HIP WITHOUT CONTRAST TECHNIQUE: Multidetector CT imaging of the right hip was  performed according to the standard protocol. Multiplanar CT image reconstructions were also generated. COMPARISON:  Hip x-rays from earlier same day. FINDINGS: Bones/Joint/Cartilage Linear lucency identified in the lateral cortex of the proximal right femoral diaphysis associated with focal cortical thickening. Imaging features compatible with nondisplaced stress fracture. The abnormality is included on the extreme inferior aspect of this study and is best visualized on sagittal reconstruction image 21 of series 5. Degenerative changes are noted in the right hip. Ligaments Suboptimally assessed by CT. Muscles and Tendons No substantial hemorrhage noted in the proximal thigh musculature. Soft tissues Distended urinary bladder is incompletely visualized. IMPRESSION: Linear lucency in the lateral cortex of the proximal right femoral diaphysis associated with focal cortical thickening. Imaging features compatible with nondisplaced stress fracture. Consider MRI of the proximal right femur for definitive characterization. Electronically Signed   By: Kennith Center M.D.   On: 01/21/2020 06:02   US Venous Img Lower Bilateral (DVT)  Result Date: 01/29/2020 CLINICAL DATA:  Hypoxia.  Elevated D-dimer. EXAM: BILATERAL LOWER EXTREMITY VENOUS DOPPLER ULTRASOUND TECHNIQUE: Gray-scale sonography with compression, as well as color and duplex ultrasound, were performed to evaluate the deep venous system(s) from the level of the common femoral vein through the popliteal and proximal calf veins. COMPARISON:  None. FINDINGS: VENOUS Normal compressibility of the common femoral, superficial femoral, and popliteal veins, as well as the visualized calf veins. Visualized portions of profunda femoral vein and great saphenous vein unremarkable. No filling defects to suggest DVT on grayscale or color Doppler imaging. Doppler waveforms show normal direction of venous flow, normal respiratory plasticity and response to augmentation. OTHER  None. Limitations: none IMPRESSION: Negative. Electronically Signed   By: Katherine Mantle M.D.   On: 01/29/2020 18:03   DG Chest Port 1 View  Result Date: 01/30/2020 CLINICAL DATA:  Shortness of breath, hypoxemic respiratory failure EXAM: PORTABLE CHEST 1 VIEW COMPARISON:  Multiple prior chest x-rays. FINDINGS: Image rotated to the RIGHT. Accounting for this cardiomediastinal contours are stable as well as hilar structures. Severe bullous changes again noted in the RIGHT upper chest. Background interstitial and airspace opacities are stable and favored to represent multifocal pneumonia. No pleural effusion. Posttraumatic changes about the bilateral chest are again noted with multiple healed rib fractures and signs of  previous LEFT thoracotomy. IMPRESSION: No significant change in diffuse interstitial and airspace opacities favored to represent multifocal pneumonia in this patient with marked emphysema and severe bullous changes in the RIGHT upper lobe in particular. Electronically Signed   By: Donzetta Kohut M.D.   On: 01/30/2020 08:39   DG Chest Port 1 View  Result Date: 01/29/2020 CLINICAL DATA:  79 year old female with history of hypoxia today. EXAM: PORTABLE CHEST 1 VIEW COMPARISON:  Chest x-ray 01/27/2020. FINDINGS: Severe emphysematous changes with extensive bullous disease in the right upper lobe. Widespread areas of interstitial prominence an ill-defined airspace disease noted throughout the mid to lower lungs bilaterally. No definite pleural effusions. No pneumothorax. No definite evidence of pulmonary edema. Mild cardiomegaly. Upper mediastinal contours are distorted by patient positioning. Numerous old healed bilateral rib fractures. IMPRESSION: 1. The appearance the chest is very similar to the prior study, again favored to reflect multilobar pneumonia. 2. Cardiomegaly. 3. Aortic atherosclerosis. 4. Emphysema. Electronically Signed   By: Trudie Reed M.D.   On: 01/29/2020 09:57   DG Chest  Port 1 View  Result Date: 01/26/2020 CLINICAL DATA:  Shortness of breath. EXAM: PORTABLE CHEST 1 VIEW COMPARISON:  January 24, 2020 FINDINGS: Stably enlarged cardiac silhouette. Calcific atherosclerotic disease of the aorta. Upper lobe predominant moderate to severe emphysematous changes. Persistent patchy interstitial and alveolar opacities in bilateral lower lobes. No evidence of pneumothorax or pleural effusion. Stable chest wall deformity from prior trauma. IMPRESSION: 1. Persistent patchy interstitial and alveolar opacities in bilateral lower lobes may represent mixed pattern pulmonary edema or bronchopneumonia. 2. Moderate to severe emphysema. 3. Stably enlarged cardiac silhouette. Electronically Signed   By: Ted Mcalpine M.D.   On: 01/26/2020 09:15   DG Chest Port 1 View  Result Date: 01/24/2020 CLINICAL DATA:  COPD exacerbation. Pt presented 01/21/2020 to the emergency room with acute onset of fall at her assisted living facility with subsequent right hip pain and significant respiratory distress. Hx - COPD, HTN, non-smoker. EXAM: PORTABLE CHEST 1 VIEW COMPARISON:  01/21/2020 FINDINGS: Changes of emphysema skeletal lung scarring and interstitial thickening are stable. Opacity at the right lung base has mildly improved from the previous study. No new lung abnormalities. No convincing pleural effusion and no pneumothorax. Cardiac silhouette is normal in size. Multiple bilateral old rib fractures are stable. IMPRESSION: 1. Mild interval improvement. Decreased opacity at the right lung base consistent with improved pneumonia or atelectasis. 2. No other change. Stable chronic findings including extensive emphysema. Electronically Signed   By: Amie Portland M.D.   On: 01/24/2020 10:43   DG Chest Portable 1 View  Result Date: 01/21/2020 CLINICAL DATA:  Larey Seat, short of breath, hypoxia EXAM: PORTABLE CHEST 1 VIEW COMPARISON:  06/07/2019 FINDINGS: Single frontal view of the chest demonstrates a stable  cardiac silhouette. Extensive bullous emphysematous changes are seen, greatest at the right apex. Marked areas of scarring and fibrosis are seen throughout the lungs. Increased density at the right costophrenic angle may reflect consolidation and/or effusion. Chronic posttraumatic changes of the thoracic cage. No acute displaced fractures. IMPRESSION: 1. 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 2. Density at the right lateral lung base may reflect focal lung consolidation and/or small effusion. Electronically Signed   By: Sharlet Salina M.D.   On: 01/21/2020 03:48   ECHOCARDIOGRAM COMPLETE  Result Date: 01/21/2020    ECHOCARDIOGRAM REPORT   Patient Name:   BERGEN MELLE Date of Exam: 01/21/2020 Medical Rec #:  [id number]  Height:       66.0 in Accession #:    [id number]    Weight:       123.2 lb Date of Birth:  1941-02-20      BSA:          1.628 m Patient Age:    79 years      BP:           116/63 mmHg Patient Gender: F             HR:           74 bpm. Exam Location:  [facility] Procedure: 2D Echo, Cardiac Doppler and Color Doppler Indications:     NSTEMI 121.4  History:         Patient has no prior history of Echocardiogram examinations.                  COPD; Risk Factors:Hypertension.  Sonographer:     Cristela Blue RDCS (AE) Referring Phys:  [id number] Vernetta Honey MANSY Diagnosing Phys: Debbe Odea MD IMPRESSIONS  1. Left ventricular ejection fraction, by estimation, is 60 to 65%. The left ventricle has normal function. The left ventricle has no regional wall motion abnormalities. Left ventricular diastolic parameters are consistent with Grade I diastolic dysfunction (impaired relaxation).  2. Right ventricular systolic function is normal. The right ventricular size is normal.  3. Left atrial size was moderately dilated.  4. The mitral valve is normal in structure. No evidence of mitral valve regurgitation. No evidence of mitral stenosis.  5. The aortic valve is normal in structure.  Aortic valve regurgitation is not visualized. No aortic stenosis is present.  6. The inferior vena cava is normal in size with greater than 50% respiratory variability, suggesting right atrial pressure of 3 mmHg. FINDINGS  Left Ventricle: Left ventricular ejection fraction, by estimation, is 60 to 65%. The left ventricle has normal function. The left ventricle has no regional wall motion abnormalities. The left ventricular internal cavity size was normal in size. There is  no left ventricular hypertrophy. Left ventricular diastolic parameters are consistent with Grade I diastolic dysfunction (impaired relaxation). Right Ventricle: The right ventricular size is normal. No increase in right ventricular wall thickness. Right ventricular systolic function is normal. Left Atrium: Left atrial size was moderately dilated. Right Atrium: Right atrial size was normal in size. Pericardium: There is no evidence of pericardial effusion. Mitral Valve: The mitral valve is normal in structure. Normal mobility of the mitral valve leaflets. No evidence of mitral valve regurgitation. No evidence of mitral valve stenosis. Tricuspid Valve: The tricuspid valve is normal in structure. Tricuspid valve regurgitation is not demonstrated. No evidence of tricuspid stenosis. Aortic Valve: The aortic valve is normal in structure. Aortic valve regurgitation is not visualized. No aortic stenosis is present. Aortic valve mean gradient measures 3.5 mmHg. Aortic valve peak gradient measures 6.5 mmHg. Aortic valve area, by VTI measures 2.46 cm. Pulmonic Valve: The pulmonic valve was not well visualized. Pulmonic valve regurgitation is not visualized. No evidence of pulmonic stenosis. Aorta: The aortic root is normal in size and structure. Venous: The inferior vena cava was not well visualized. The inferior vena cava is normal in size with greater than 50% respiratory variability, suggesting right atrial pressure of 3 mmHg. IAS/Shunts: No atrial level  shunt detected by color flow Doppler.  LEFT VENTRICLE PLAX 2D LVIDd:         4.77 cm  Diastology LVIDs:  3.08 cm  LV e' lateral:   7.83 cm/s LV PW:         1.15 cm  LV E/e' lateral: 11.6 LV IVS:        1.11 cm  LV e' medial:    5.87 cm/s LVOT diam:     2.00 cm  LV E/e' medial:  15.5 LV SV:         68 LV SV Index:   42 LVOT Area:     3.14 cm  RIGHT VENTRICLE RV Basal diam:  3.63 cm RV S prime:     12.40 cm/s TAPSE (M-mode): 3.6 cm LEFT ATRIUM             Index       RIGHT ATRIUM           Index LA diam:        4.80 cm 2.95 cm/m  RA Area:     15.10 cm LA Vol (A2C):   83.7 ml 51.42 ml/m RA Volume:   35.80 ml  21.99 ml/m LA Vol (A4C):   96.5 ml 59.28 ml/m LA Biplane Vol: 91.0 ml 55.90 ml/m  AORTIC VALVE                   PULMONIC VALVE AV Area (Vmax):    2.25 cm    PV Vmax:        0.92 m/s AV Area (Vmean):   2.51 cm    PV Peak grad:   3.4 mmHg AV Area (VTI):     2.46 cm    RVOT Peak grad: 5 mmHg AV Vmax:           127.00 cm/s AV Vmean:          86.450 cm/s AV VTI:            0.277 m AV Peak Grad:      6.5 mmHg AV Mean Grad:      3.5 mmHg LVOT Vmax:         90.90 cm/s LVOT Vmean:        69.000 cm/s LVOT VTI:          0.217 m LVOT/AV VTI ratio: 0.78  AORTA Ao Root diam: 3.50 cm MITRAL VALVE                TRICUSPID VALVE MV Area (PHT): 3.34 cm     TR Peak grad:   30.7 mmHg MV Decel Time: 227 msec     TR Vmax:        277.00 cm/s MV E velocity: 91.20 cm/s MV A velocity: 137.00 cm/s  SHUNTS MV E/A ratio:  0.67         Systemic VTI:  0.22 m                             Systemic Diam: 2.00 cm Debbe Odea MD Electronically signed by Debbe Odea MD Signature Date/Time: 01/21/2020/4:26:00 PM    Final    DG HIP OPERATIVE UNILAT W OR W/O PELVIS RIGHT  Result Date: 01/26/2020 CLINICAL DATA:  Portable operative images for right proximal femur ORIF. EXAM: OPERATIVE RIGHT HIP (WITH PELVIS IF PERFORMED) 5 VIEWS TECHNIQUE: Fluoroscopic spot image(s) were submitted for interpretation post-operatively.  COMPARISON:  01/25/2020 FINDINGS: Five spot fluoro graphic operative images show placement of a long medullary rod through the femur supporting a compression screw. The proximal femur fracture components have been reduced, with  the distal fracture component minimally displaced medial compared to the proximal component by 6-7 mm. Orthopedic hardware appears well seated and aligned. IMPRESSION: 1. Significant reduction of the proximal right femur fracture following ORIF. Electronically Signed   By: Amie Portland M.D.   On: 01/26/2020 18:41   DG HIP UNILAT WITH PELVIS 2-3 VIEWS RIGHT  Result Date: 01/25/2020 CLINICAL DATA:  Dislocated hip, right, initial encounter. Worsening right hip pain. EXAM: DG HIP (WITH OR WITHOUT PELVIS) 2-3V RIGHT COMPARISON:  Radiograph and CT 01/21/2020, radiograph 07/04/2019 FINDINGS: The previous incomplete subtrochanteric fracture of the right hip is now a complete fracture, displaced and significantly angulated with apex anterior angulation of 79 degrees. Fracture may be bisphosphonate related with cortical thickening noted on February 2021 exam. The femoral head remains seated in the acetabulum without dislocation. Moderate right hip osteoarthritis. Pubic rami are intact. IMPRESSION: 1. The previous incomplete subtrochanteric fracture is now a complete fracture, displaced with significant angulation. 2. Femoral head is well seated in the acetabulum without dislocation. Electronically Signed   By: Narda Rutherford M.D.   On: 01/25/2020 17:15   DG Hip Unilat W or Wo Pelvis 2-3 Views Right  Result Date: 01/21/2020 CLINICAL DATA:  [place] Seat, pain EXAM: DG HIP (WITH OR WITHOUT PELVIS) 2-3V RIGHT COMPARISON:  01/31/2017 FINDINGS: Frontal view of the pelvis as well as frontal and cross-table lateral views of the right hip are obtained. There is a nondisplaced transverse fracture through the proximal femoral diaphysis. Alignment is anatomic. There are no other acute displaced fractures. The  hips are well aligned. Mild bilateral hip osteoarthritis. Sacroiliac joints are normal. IMPRESSION: 1. Nondisplaced transverse fracture through the proximal right femoral diaphysis. Fracture line involves an area of chronic focal cortical thickening, and likely reflects an acute fracture at a site of chronic stress. Electronically Signed   By: Sharlet Salina M.D.   On: 01/21/2020 03:46      ASSESSMENT/PLAN    Acute hypoxemic respiratory failure -s/p mechanical fall unknown time down - has RLL consolidated infiltrate, unclearly defined due to significant motion artifact as shown in pictorial documentation above.  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  Telemetry monitoring for arrythmia 9/13 - patient is improved close to baseline and is at 3-4L/min, continue current therapy and add PT/OT with any additional recommendations for PT as per their asessment.  Will sign off today , please reach out via Epic with any questions   Advanced COPD with bullous emphysema   - continue typical COPD care path   -continue solumedrol 40 daily >>>>prednisone 40 with taper   -patient with overall poor prognosis -appreciate Palliative care  evaluation   Acute blood loss anemia   - transfuse to goal Hemoglobin >8   -9/13 - mild incrementation of hb  overnight likely due to hemoconcentration, stable h/h overnight   Moderate protein calorie malnutrition    - peripheral and bitemporal muscle atrophy    - recommend dietary evaluation by RD - order placed      Thank you for allowing me to participate in the care of this patient.    Patient/Family are satisfied with care plan and all questions have been answered.  This document was prepared using Dragon voice recognition software and may include unintentional dictation errors.     Vida Rigger, M.D.  Division of Pulmonary & Critical Care Medicine  [facility]

## 2020-02-20 ENCOUNTER — Non-Acute Institutional Stay: Payer: Medicare Other | Admitting: Primary Care

## 2020-02-20 ENCOUNTER — Other Ambulatory Visit: Payer: Self-pay

## 2020-02-20 DIAGNOSIS — S72044A Nondisplaced fracture of base of neck of right femur, initial encounter for closed fracture: Secondary | ICD-10-CM

## 2020-02-20 DIAGNOSIS — J9601 Acute respiratory failure with hypoxia: Secondary | ICD-10-CM

## 2020-02-20 DIAGNOSIS — J9602 Acute respiratory failure with hypercapnia: Secondary | ICD-10-CM

## 2020-02-20 DIAGNOSIS — J441 Chronic obstructive pulmonary disease with (acute) exacerbation: Secondary | ICD-10-CM

## 2020-02-20 DIAGNOSIS — Z515 Encounter for palliative care: Secondary | ICD-10-CM

## 2020-02-20 DIAGNOSIS — I5032 Chronic diastolic (congestive) heart failure: Secondary | ICD-10-CM

## 2020-02-20 NOTE — Progress Notes (Signed)
[facility] Consult Note Telephone: [phone]  Fax: [phone]  PATIENT NAME: Alice Wallace [place] [address] [phone] (home)  DOB: 12-31-1940 MRN: [id number]  PRIMARY CARE PROVIDER:     Juluis Pitch, MD [place] [address]  REFERRING PROVIDER:   Juluis Pitch, [place],  [place] [address]   RESPONSIBLE PARTY:   Extended Emergency Contact Information Primary Emergency Contact: Blenda Mounts Work Phone: [phone] Mobile Phone: [phone] Relation: Legal Guardian Secondary Emergency Contact: Kevin Fenton Mobile Phone: [phone] Relation: Other  I met face to face with patient in facility.  ASSESSMENT AND RECOMMENDATIONS:   1. Advance Care Planning/Goals of Care: Goals include to maximize quality of life and symptom management. Pt is DNR per staff. MOST requested of SW. T/c to [facility] [place] [facility] to discuss as she is guardian.  2. Symptom Management:   Staff states pt is deaf and has guardian at [place]. Today she presents in in bed, not dyspnic although she states she feels warm. Staff states recent UTI dx. She has rocephin for this now IM.  She is also exhibiting respiratory sx, productive yellow/green sputum and sat is 90% on 4 L oxygen and at rest. She also endorses feeling of flushing. I would recommend increasing mucinex and covering with a respiratory abx for possible bronchitis if not responsive to cextriaxone. Instructed staff to encourage po fluid intake.   She was a ALF resident prior to coming to SNF. Was for D/c back Monday but has worsened with several infections. I will continue to follow at SNF for goals of care, sx management.  3. Follow up Palliative Care Visit: Palliative care will continue to follow for goals of care clarification and symptom management. Return 2 weeks or prn.  4. Family /Caregiver/Community Supports: Guardian  from [place], [place]. Lives at [facility] prior to SNF admission.  5. Cognitive / Functional decline: Alert and oriented x 1, needs assist with all iadls, most adls.  I spent 35 minutes providing this consultation,  from 1200 to 1235. More than 50% of the time in this consultation was spent coordinating communication.   CHIEF COMPLAINT: UTI, debility  HISTORY OF PRESENT ILLNESS:  Alice Wallace is a 79 y.o. year old female with multiple medical problems including COPD, Respiratory infection, urine infection, oxygen dependence, debility. Palliative Care was asked to follow this patient by consultation request of Dr Zipporah Plants to help address advance care planning and goals of care. This is the initial visit.  CODE STATUS: DNR  PPS: 40%  HOSPICE ELIGIBILITY/DIAGNOSIS: TBD  PHYSICAL EXAM / ROS:  92/48 HR- 94 RR- 22  Current and past weights: 163 LBS reported historically, now 147 lbs at SNF General: slight resp distress, frail appearing, WNWD, endorses being warm.  Cardiovascular: no chest pain reported, no LE  edema  Pulmonary: no cough, no increased SOB, 4 L [place], pulse ox 90% Abdomen: appetite fair, incontinent of bowel GU: Signs of dysuria, incontinent of urine, recent UTI dx MSK:  ++ joint and ROM abnormalities, non-ambulatory, in bed today Skin: no rashes or wounds reported Neurological: Weakness,deaf, signs to speak.  Patient Active Problem List   Diagnosis Date Noted   Malnutrition of moderate degree 02/03/2020   Chronic diastolic CHF (congestive heart failure) ([place]) 02/03/2020   Pressure injury of skin 02/01/2020   Nondisplaced fracture of base of neck of right femur, initial encounter for closed fracture ([place])  Goals of care, counseling/discussion    Palliative care by specialist    Hip fracture, unspecified laterality, closed, initial encounter ([place]) 01/26/2020   Chronic obstructive pulmonary disease with acute exacerbation ([place]) 01/21/2020    Non-STEMI (non-ST elevated myocardial infarction) ([place]) 01/21/2020   Acute respiratory failure with hypoxia and hypercapnia ([place])    Pneumonia due to COVID-19 virus 06/07/2019   Hypokalemia 06/07/2019   AKI (acute kidney injury) ([place]) 06/07/2019   Nonspeaking deaf 06/07/2019   COVID-19 virus infection 05/27/2019    PAST MEDICAL HISTORY:   UTI dx 02/20/20 Past Medical History:  Diagnosis Date   COPD (chronic obstructive pulmonary disease) ([place])    Deaf    Hypertension    Osteoporosis     SOCIAL HX:  Social History   Tobacco Use   Smoking status: Never Smoker   Smokeless tobacco: Never Used  Substance Use Topics   Alcohol use: Not Currently   FAMILY HX: No family history on file.  ALLERGIES:  Allergies  Allergen Reactions   Hydrocodone     Other reaction(s): Unknown   Naproxen     Other reaction(s): Unknown   Other Other (See Comments)    nuts   Sulfamethoxazole-Trimethoprim     Other reaction(s): Unknown   Tramadol     Other reaction(s): Unknown Pt has tolerated this med 1/5-1/7 with no issues     PERTINENT MEDICATIONS:  Outpatient Encounter Medications as of 02/20/2020  Medication Sig   acetaminophen (TYLENOL) 325 MG tablet Take 2 tablets (650 mg total) by mouth every 6 (six) hours.   acetaminophen (TYLENOL) 500 MG tablet Take 500 mg by mouth every 8 (eight) hours as needed for moderate pain or fever.   acidophilus (RISAQUAD) CAPS capsule Take 1 capsule by mouth 3 (three) times daily.   albuterol (PROVENTIL) (2.5 MG/3ML) 0.083% nebulizer solution Take 2.5 mg by nebulization every 6 (six) hours as needed for wheezing or shortness of breath.   albuterol (VENTOLIN HFA) 108 (90 Base) MCG/ACT inhaler Inhale 2 puffs into the lungs every 4 (four) hours as needed for wheezing or shortness of breath.   alendronate (FOSAMAX) 70 MG tablet Take 70 mg by mouth once a week. Take with a full glass of water on an empty stomach.   aluminum-magnesium  hydroxide-simethicone (MAALOX) 782-423-53 MG/5ML SUSP Take 30 mLs by mouth 4 (four) times daily -  before meals and at bedtime.   aspirin EC 81 MG tablet Take 81 mg by mouth daily. Swallow whole.   atorvastatin (LIPITOR) 80 MG tablet Take 1 tablet (80 mg total) by mouth daily.   Calcium Carbonate-Vitamin D3 (CALCIUM 600-D) 600-400 MG-UNIT TABS Take 1 tablet by mouth 2 (two) times daily.   calcium-vitamin D (OSCAL WITH D) 500-200 MG-UNIT tablet Take 1 tablet by mouth 2 (two) times daily.   cetirizine (ZYRTEC) 10 MG tablet Take 10 mg by mouth daily.   Cholecalciferol (VITAMIN D) 125 MCG (5000 UT) CAPS Take 5,000 Units by mouth daily.   clopidogrel (PLAVIX) 75 MG tablet Take 1 tablet (75 mg total) by mouth daily.   diclofenac sodium (VOLTAREN) 1 % GEL Apply 2 g topically 3 (three) times daily.   diltiazem (CARDIZEM CD) 120 MG 24 hr capsule Take 1 capsule (120 mg total) by mouth daily.   diphenhydrAMINE (BENADRYL) 25 MG tablet Take 25 mg by mouth every 6 (six) hours as needed for allergies.   docusate sodium (COLACE) 100 MG capsule Take 1 capsule (100 mg total) by mouth 2 (two) times daily.  escitalopram (LEXAPRO) 10 MG tablet Take 10 mg by mouth at bedtime.   feeding supplement, ENSURE ENLIVE, (ENSURE ENLIVE) LIQD Take 237 mLs by mouth 3 (three) times daily between meals.   ferrous sulfate 325 (65 FE) MG tablet Take 325 mg by mouth 2 (two) times daily with a meal.    Fluticasone-Salmeterol (ADVAIR) 100-50 MCG/DOSE AEPB Inhale 1 puff into the lungs 2 (two) times daily.   furosemide (LASIX) 20 MG tablet Take 20 mg by mouth daily.   guaiFENesin (MUCINEX) 600 MG 12 hr tablet Take 600 mg by mouth daily.   guaifenesin (ROBITUSSIN) 100 MG/5ML syrup Take 300 mg by mouth 4 (four) times daily as needed for cough.   loperamide (IMODIUM A-D) 2 MG tablet Take 2 mg by mouth daily as needed for diarrhea or loose stools.   magnesium hydroxide (MILK OF MAGNESIA) 400 MG/5ML suspension Take 30 mLs  by mouth daily as needed for mild constipation.   metoCLOPramide (REGLAN) 5 MG tablet Take 1-2 tablets (5-10 mg total) by mouth every 8 (eight) hours as needed for nausea (if ondansetron (ZOFRAN) ineffective.).   metoprolol succinate (TOPROL-XL) 25 MG 24 hr tablet Take 0.5 tablets (12.5 mg total) by mouth daily.   Multiple Vitamin (MULTIVITAMIN WITH MINERALS) TABS tablet Take 1 tablet by mouth daily. (Patient not taking: Reported on 01/21/2020)   nitroGLYCERIN (NITROSTAT) 0.4 MG SL tablet Place 1 tablet (0.4 mg total) under the tongue every 5 (five) minutes as needed for chest pain.   omeprazole (PRILOSEC) 20 MG capsule Take 20 mg by mouth daily.   ondansetron (ZOFRAN) 4 MG tablet Take 4 mg by mouth every 4 (four) hours as needed for nausea or vomiting.   ondansetron (ZOFRAN) 4 MG tablet Take 1 tablet (4 mg total) by mouth every 6 (six) hours as needed for nausea.   potassium chloride SA (KLOR-CON) 20 MEQ tablet Take 40 mEq by mouth daily.   tiZANidine (ZANAFLEX) 4 MG tablet Take 4 mg by mouth daily.   tolnaftate (ANTIFUNGAL) 1 % cream Apply 1 application topically as needed.   traMADol (ULTRAM) 50 MG tablet Take 50 mg by mouth every 8 (eight) hours.   traZODone (DESYREL) 50 MG tablet Take 0.5 tablets (25 mg total) by mouth at bedtime as needed for sleep.   No facility-administered encounter medications on file as of 02/20/2020.    Jason Coop, NP , DNP, MPH, [facility]  COVID-19 PATIENT SCREENING TOOL  Person answering questions: ____________staff______ _____   1.  Is the patient or any family member in the home showing any signs or symptoms regarding respiratory infection?               Person with Symptom- __________NA_________________  a. Fever                                                                          Yes___ No___          ___________________  b. Shortness of breath  Yes___ No___           ___________________ c. Cough/congestion                                       Yes___  No___         ___________________ d. Body aches/pains                                                         Yes___ No___        ____________________ e. Gastrointestinal symptoms (diarrhea, nausea)           Yes___ No___        ____________________  2. Within the past 14 days, has anyone living in the home had any contact with someone with or under investigation for COVID-19?    Yes___ No_X_   Person __________________

## 2020-02-25 ENCOUNTER — Other Ambulatory Visit: Payer: Self-pay

## 2020-02-25 ENCOUNTER — Non-Acute Institutional Stay: Payer: Medicare Other | Admitting: Primary Care

## 2020-02-25 DIAGNOSIS — Z515 Encounter for palliative care: Secondary | ICD-10-CM

## 2020-02-25 DIAGNOSIS — I5032 Chronic diastolic (congestive) heart failure: Secondary | ICD-10-CM

## 2020-02-25 DIAGNOSIS — J441 Chronic obstructive pulmonary disease with (acute) exacerbation: Secondary | ICD-10-CM

## 2020-02-25 DIAGNOSIS — S72044A Nondisplaced fracture of base of neck of right femur, initial encounter for closed fracture: Secondary | ICD-10-CM

## 2020-02-25 NOTE — Progress Notes (Signed)
[facility] Consult Note Telephone: [phone]  Fax: [phone]  PATIENT NAME: Alice Wallace [place] [id number] [phone] (home)  DOB: [birth year] MRN: [id number]  PRIMARY CARE PROVIDER:    Juluis Pitch, MD,  [address] [place] [address] [phone]  REFERRING PROVIDER:   Juluis Pitch, MD [address] [place],  [place] [address] [phone]  RESPONSIBLE PARTY:   Extended Emergency Contact Information Primary Emergency Contact: Blenda Mounts Work Phone: [phone] Mobile Phone: [phone] Relation: Legal Guardian Secondary Emergency Contact: Kevin Fenton Mobile Phone: [phone] Relation: Other  I met face to face with patient  In the facility.  ASSESSMENT AND RECOMMENDATIONS:   1. Advance Care Planning/Goals of Care: Goals include to maximize quality of life and symptom management. DSS is guardian. Goals of care include rehabilitation. MOST on file with DNR, full scope of interventions, Use of abx, use of iv fluids, trial period of feeding tube  2. Symptom Management:   I saw Alice Wallace in her room she was having her lunch. She stated that her appetite was good although she did not like some of the food offerings. She was sitting in a chair and feeding herself this week. Last week she appeared more debilitated so I was encouraged at her improvement in ADLs. She denied pain and stated that she felt well. I will attempt to reach her power of attorney/Guardian again because I was not able to connect with her last week. Plan is for continued rehab and disposition based on abilities at that time.  She has completed her antibiotics for UTI and appears much more comfortable and interactive.   3. Follow up Palliative Care Visit: Palliative care will continue to follow for goals of care clarification and symptom management. Return 2-4 weeks or prn.  4. Family /Caregiver/Community Supports:  Guardian via dss, in SNF but lived in ALF prior to hospital stay.  5. Cognitive / Functional decline: A and O x 2-3, Increasing in ability to assist with adls. Dependent in iadls.  I spent 25 minutes providing this consultation,  from 1035 to 1100. More than 50% of the time in this consultation was spent coordinating communication.   CHIEF COMPLAINT: debility, immobility,  HISTORY OF PRESENT ILLNESS:  Alice Wallace is a 79 y.o. year old female with who presents today with resolving debility following hip fracture, pneumonia and MI a month ago.She has DOE which has improved since her hospital stay but remains on oxygen.  Palliative Care was asked to follow this patient by consultation request of Juluis Pitch, MD to help address advance care planning and goals of care. This is a follow up visit.   CODE STATUS: DNR, full scope of interventions, Use of abx, use of iv fluids, trial period of feeding tube  PPS: 40%  HOSPICE ELIGIBILITY/DIAGNOSIS: TBD  ROS  General: Denies discomforts Cardiovascular: denies chest pain Pulmonary: denies  cough, denies increased SOB Abdomen: endorses  Good appetite, staff denies constipation GU: denies dysuria per staff MSK:  endorses ROM limitations, no falls reported Neurological: endorses weakness, denies pain, denies insomnia Psych: Endorses positive mood  Physical Exam: Current and past weights: Weight on 02/03/20 = 163 lbs, wt today 145.6 lbs, 11% weight loss Constitutional:  NAD General:frail appearing, thin  CV: no LE edema Pulmonary: no increased work of breathing, no cough, DOE, oxygen @ 3 L/ [place] Abdomen: no ascites GU: deferred MSK: mild sacropenia, decreased ROM in all extremities, healing R femoral fx Skin: Sacral wound  reported Neurological: Weakness,  grossly non focal Psych: non anxious affective, A and O x 2-3   CURRENT PROBLEM LIST:  Patient Active Problem List   Diagnosis Date Noted  . Malnutrition of moderate degree  02/03/2020  . Chronic diastolic CHF (congestive heart failure) ([facility]) 02/03/2020  . Pressure injury of skin 02/01/2020  . Nondisplaced fracture of base of neck of right femur, initial encounter for closed fracture ([facility])   . Goals of care, counseling/discussion   . Palliative care by specialist   . Hip fracture, unspecified laterality, closed, initial encounter ([facility]) 01/26/2020  . Chronic obstructive pulmonary disease with acute exacerbation ([facility]) 01/21/2020  . Non-STEMI (non-ST elevated myocardial infarction) ([facility]) 01/21/2020  . Acute respiratory failure with hypoxia and hypercapnia ([facility])   . Pneumonia due to COVID-19 virus 06/07/2019  . Hypokalemia 06/07/2019  . AKI (acute kidney injury) ([facility]) 06/07/2019  . Nonspeaking deaf 06/07/2019  . COVID-19 virus infection 05/27/2019   PAST MEDICAL HISTORY:  Past Medical History:  Diagnosis Date  . COPD (chronic obstructive pulmonary disease) ([facility])   . Deaf   . Hypertension   . Osteoporosis     SOCIAL HX:  Social History   Tobacco Use  . Smoking status: Never Smoker  . Smokeless tobacco: Never Used  Substance Use Topics  . Alcohol use: Not Currently   FAMILY HX: No family history on file.  ALLERGIES:  Allergies  Allergen Reactions  . Hydrocodone     Other reaction(s): Unknown  . Naproxen     Other reaction(s): Unknown  . Other Other (See Comments)    nuts  . Sulfamethoxazole-Trimethoprim     Other reaction(s): Unknown  . Tramadol     Other reaction(s): Unknown Pt has tolerated this med 1/5-1/7 with no issues     PERTINENT MEDICATIONS:  Outpatient Encounter Medications as of 02/25/2020  Medication Sig  . acetaminophen (TYLENOL) 325 MG tablet Take 2 tablets (650 mg total) by mouth every 6 (six) hours.  Marland Kitchen acetaminophen (TYLENOL) 500 MG tablet Take 500 mg by mouth every 8 (eight) hours as needed for moderate pain or fever.  Marland Kitchen acidophilus (RISAQUAD) CAPS capsule Take 1 capsule by mouth 3 (three) times daily.  Marland Kitchen albuterol  (PROVENTIL) (2.5 MG/3ML) 0.083% nebulizer solution Take 2.5 mg by nebulization every 6 (six) hours as needed for wheezing or shortness of breath.  Marland Kitchen albuterol (VENTOLIN HFA) 108 (90 Base) MCG/ACT inhaler Inhale 2 puffs into the lungs every 4 (four) hours as needed for wheezing or shortness of breath.  Marland Kitchen alendronate (FOSAMAX) 70 MG tablet Take 70 mg by mouth once a week. Take with a full glass of water on an empty stomach.  Marland Kitchen aluminum-magnesium hydroxide-simethicone (MAALOX) 703-500-93 MG/5ML SUSP Take 30 mLs by mouth 4 (four) times daily -  before meals and at bedtime.  . Amino Acids-Protein Hydrolys (FEEDING SUPPLEMENT, PRO-STAT SUGAR FREE 64,) LIQD Take 30 mLs by mouth 2 (two) times daily.  Marland Kitchen aspirin EC 81 MG tablet Take 81 mg by mouth daily. Swallow whole.  Marland Kitchen atorvastatin (LIPITOR) 80 MG tablet Take 1 tablet (80 mg total) by mouth daily.  . Calcium Carbonate-Vitamin D3 (CALCIUM 600-D) 600-400 MG-UNIT TABS Take 1 tablet by mouth 2 (two) times daily.  . cetirizine (ZYRTEC) 10 MG tablet Take 10 mg by mouth daily.  . Cholecalciferol (VITAMIN D) 125 MCG (5000 UT) CAPS Take 5,000 Units by mouth daily.  . clopidogrel (PLAVIX) 75 MG tablet Take 1 tablet (75 mg total) by mouth daily.  . diclofenac  sodium (VOLTAREN) 1 % GEL Apply 2 g topically 3 (three) times daily.  Marland Kitchen diltiazem (CARDIZEM CD) 120 MG 24 hr capsule Take 1 capsule (120 mg total) by mouth daily.  . diphenhydrAMINE (BENADRYL) 25 MG tablet Take 25 mg by mouth every 6 (six) hours as needed for allergies.   Marland Kitchen docusate sodium (COLACE) 100 MG capsule Take 1 capsule (100 mg total) by mouth 2 (two) times daily.  Marland Kitchen escitalopram (LEXAPRO) 10 MG tablet Take 10 mg by mouth at bedtime.  . feeding supplement, ENSURE ENLIVE, (ENSURE ENLIVE) LIQD Take 237 mLs by mouth 3 (three) times daily between meals.  . ferrous sulfate 325 (65 FE) MG tablet Take 325 mg by mouth 2 (two) times daily with a meal.   . Fluticasone-Salmeterol (ADVAIR) 100-50 MCG/DOSE AEPB  Inhale 1 puff into the lungs 2 (two) times daily.  . furosemide (LASIX) 20 MG tablet Take 20 mg by mouth daily.  Marland Kitchen guaiFENesin (MUCINEX) 600 MG 12 hr tablet Take 600 mg by mouth daily.  Marland Kitchen guaifenesin (ROBITUSSIN) 100 MG/5ML syrup Take 300 mg by mouth 4 (four) times daily as needed for cough.  . loperamide (IMODIUM A-D) 2 MG tablet Take 2 mg by mouth daily as needed for diarrhea or loose stools.  . magnesium hydroxide (MILK OF MAGNESIA) 400 MG/5ML suspension Take 30 mLs by mouth daily as needed for mild constipation.  . metoCLOPramide (REGLAN) 5 MG tablet Take 1-2 tablets (5-10 mg total) by mouth every 8 (eight) hours as needed for nausea (if ondansetron (ZOFRAN) ineffective.).  Marland Kitchen metoprolol succinate (TOPROL-XL) 25 MG 24 hr tablet Take 0.5 tablets (12.5 mg total) by mouth daily.  . Multiple Vitamin (MULTIVITAMIN WITH MINERALS) TABS tablet Take 1 tablet by mouth daily.  . nitrofurantoin, macrocrystal-monohydrate, (MACROBID) 100 MG capsule Take 100 mg by mouth 2 (two) times daily.  . nitroGLYCERIN (NITROSTAT) 0.4 MG SL tablet Place 1 tablet (0.4 mg total) under the tongue every 5 (five) minutes as needed for chest pain.  Marland Kitchen nystatin ointment (MYCOSTATIN) Apply 1 application topically 2 (two) times daily. Apply to lower back  . omeprazole (PRILOSEC) 20 MG capsule Take 20 mg by mouth daily.  . ondansetron (ZOFRAN) 4 MG tablet Take 1 tablet (4 mg total) by mouth every 6 (six) hours as needed for nausea.  . OXYGEN Inhale 4 L/min into the lungs continuous. May titrate to maintain sats >92%  . potassium chloride SA (KLOR-CON) 20 MEQ tablet Take 40 mEq by mouth daily.  Marland Kitchen tiZANidine (ZANAFLEX) 4 MG tablet Take 4 mg by mouth daily.  Marland Kitchen tolnaftate (ANTIFUNGAL) 1 % cream Apply 1 application topically as needed.  . traMADol (ULTRAM) 50 MG tablet Take 50 mg by mouth every 8 (eight) hours.  . [DISCONTINUED] calcium-vitamin D (OSCAL WITH D) 500-200 MG-UNIT tablet Take 1 tablet by mouth 2 (two) times daily.   No  facility-administered encounter medications on file as of 02/25/2020.    Jason Coop, NP , DNP, MPH, [facility]  COVID-19 PATIENT SCREENING TOOL  Person answering questions: ____________staff______ _____   1.  Is the patient or any family member in the home showing any signs or symptoms regarding respiratory infection?               Person with Symptom- __________NA_________________  a. Fever  Yes___ No___          ___________________  b. Shortness of breath                                                    Yes___ No___          ___________________ c. Cough/congestion                                       Yes___  No___         ___________________ d. Body aches/pains                                                         Yes___ No___        ____________________ e. Gastrointestinal symptoms (diarrhea, nausea)           Yes___ No___        ____________________  2. Within the past 14 days, has anyone living in the home had any contact with someone with or under investigation for COVID-19?    Yes___ No_X_   Person __________________

## 2020-03-12 ENCOUNTER — Non-Acute Institutional Stay: Payer: Medicare Other | Admitting: Primary Care

## 2020-03-12 ENCOUNTER — Other Ambulatory Visit: Payer: Self-pay

## 2020-03-12 DIAGNOSIS — J441 Chronic obstructive pulmonary disease with (acute) exacerbation: Secondary | ICD-10-CM

## 2020-03-12 DIAGNOSIS — Z515 Encounter for palliative care: Secondary | ICD-10-CM

## 2020-03-12 DIAGNOSIS — I5032 Chronic diastolic (congestive) heart failure: Secondary | ICD-10-CM

## 2020-03-12 NOTE — Progress Notes (Signed)
[facility] Consult Note Telephone: [phone]  Fax: [phone]  PATIENT NAME: Alice Wallace [place] [id number] [phone] (home)  DOB: [birth year] MRN: [id number]  PRIMARY CARE PROVIDER:    Juluis Pitch, MD,  [address] [place] [address] [phone]  REFERRING PROVIDER:   Juluis Pitch, MD [address] [place],  [place] [address] [phone]  RESPONSIBLE PARTY:   Extended Emergency Contact Information Primary Emergency Contact: Blenda Mounts Work Phone: [phone] Mobile Phone: [phone] Relation: Legal Guardian Secondary Emergency Contact: Kevin Fenton Mobile Phone: [phone] Relation: Other  I met face to face with patient in facility.   ASSESSMENT AND RECOMMENDATIONS:   1. Advance Care Planning/Goals of Care: Goals include to maximize quality of life and symptom management. T/c to guardian at [place], [facility]. No answer, message left. MOST on file with DNR, Full scope of interventions, use of abx, use of iv, limited use of  Feeding tube.  2. Symptom Management:   I met with [name] in her nursing home room. She has her lunch tray but did not eat any of her plate. She had eated her fruit cup and states she likes the Dr Malachi Bonds and wants to drink that. Her weight has declined 4.5 lbs in a month.She is offered supplements but will not take them. She denies pain and nausea and seems comfortable. I am concerned about her steady weight loss and recommend a consult with nutrition and a deaf interpreter to find out what foods she likes, and if she'll take a commercial supplement such as Boost or Ensure.   3. Follow up Palliative Care Visit: Palliative care will continue to follow for goals of care clarification and symptom management. Return 4 weeks or prn.  4. Family /Caregiver/Community Supports: Legal guardian through DSS,currently in SNF for rehab  5. Cognitive / Functional decline:    A and O x 2, Needs assistance with all adls. Can feed self with set up. Dependent in all iadls.   I spent 25 minutes providing this consultation,  from 1030 to 1055. More than 50% of the time in this consultation was spent coordinating communication.   CHIEF COMPLAINT: weigh loss  HISTORY OF PRESENT ILLNESS:  Alice Wallace is a 79 y.o. year old female  with debility and weight loss . We are asked to consult around goals of care and disease decline.    Palliative Care was asked to follow this patient by consultation request of Juluis Pitch, MD to help address advance care planning and goals of care. This is a follow up visit.  CODE STATUS: DNR PPS: 40%  HOSPICE ELIGIBILITY/DIAGNOSIS: TBD  ROS  General: NAD Pulmonary: denies  cough, denies increased SOB,  Abdomen: endorses fair appetite, denies nausea, denies  constipation MSK:  endorses ROM limitations, no falls reported Skin: denies rashes or wounds Neurological: denies pain, denies insomnia Psych: Endorses positive mood   Physical Exam: Current and past weights:163 LBS reported historically,  147 lbs 02/20/20, now 142.5 lbs.  Constitutional: 98.0 78-17 BP 91/53 General :frail appearing, thin EYES: anicteric sclera, lids intact, no discharge  ENMT: deaf,oral mucous membranes moist, dentition intact CV:  no LE edema Pulmonary:no increased work of breathing, no cough, no audible wheezes, oxygen at 2 L / [place], 99% PO2. Abdomen: intake 25-50%,did not eat any of her lunch today,  no ascites GU: deferred MSK: moderate sacropenia, decreased ROM in all extremities, no contractures of LE, non ambulatory Skin: warm and dry, no rashes  or wounds on visible skin Neuro: Weakness,  grossly non -focal Psych: non-anxious affect, A and O x 1, unable to assess further   CURRENT PROBLEM LIST:  Patient Active Problem List   Diagnosis Date Noted  . Malnutrition of moderate degree 02/03/2020  . Chronic diastolic CHF (congestive heart  failure) ([facility]) 02/03/2020  . Pressure injury of skin 02/01/2020  . Nondisplaced fracture of base of neck of right femur, initial encounter for closed fracture ([facility])   . Goals of care, counseling/discussion   . Palliative care by specialist   . Hip fracture, unspecified laterality, closed, initial encounter ([facility]) 01/26/2020  . Chronic obstructive pulmonary disease with acute exacerbation ([facility]) 01/21/2020  . Non-STEMI (non-ST elevated myocardial infarction) ([facility]) 01/21/2020  . Acute respiratory failure with hypoxia and hypercapnia ([facility])   . Pneumonia due to COVID-19 virus 06/07/2019  . Hypokalemia 06/07/2019  . AKI (acute kidney injury) ([facility]) 06/07/2019  . Nonspeaking deaf 06/07/2019  . COVID-19 virus infection 05/27/2019   PAST MEDICAL HISTORY:  Past Medical History:  Diagnosis Date  . COPD (chronic obstructive pulmonary disease) ([facility])   . Deaf   . Hypertension   . Osteoporosis     SOCIAL HX:  Social History   Tobacco Use  . Smoking status: Never Smoker  . Smokeless tobacco: Never Used  Substance Use Topics  . Alcohol use: Not Currently   FAMILY HX: No family history on file.  ALLERGIES:  Allergies  Allergen Reactions  . Hydrocodone     Other reaction(s): Unknown  . Naproxen     Other reaction(s): Unknown  . Other Other (See Comments)    nuts  . Sulfamethoxazole-Trimethoprim     Other reaction(s): Unknown  . Tramadol     Other reaction(s): Unknown Pt has tolerated this med 1/5-1/7 with no issues     PERTINENT MEDICATIONS:  Outpatient Encounter Medications as of 03/12/2020  Medication Sig  . acetaminophen (TYLENOL) 325 MG tablet Take 2 tablets (650 mg total) by mouth every 6 (six) hours. (Patient taking differently: Take 650 mg by mouth every 6 (six) hours. Not to exceed 4 grams / 24 hours)  . acidophilus (RISAQUAD) CAPS capsule Take 1 capsule by mouth 3 (three) times daily.  Marland Kitchen albuterol (VENTOLIN HFA) 108 (90 Base) MCG/ACT inhaler Inhale 2 puffs into the lungs  every 4 (four) hours as needed for wheezing or shortness of breath.  Marland Kitchen alendronate (FOSAMAX) 70 MG tablet Take 70 mg by mouth once a week. Take with a full glass of water on an empty stomach.  Marland Kitchen aluminum-magnesium hydroxide-simethicone (MAALOX) 115-726-20 MG/5ML SUSP Take 30 mLs by mouth 4 (four) times daily -  before meals and at bedtime.  . Amino Acids-Protein Hydrolys (FEEDING SUPPLEMENT, PRO-STAT SUGAR FREE 64,) LIQD Take 30 mLs by mouth 2 (two) times daily.  Marland Kitchen ascorbic acid (VITAMIN C) 500 MG tablet Take 500 mg by mouth daily.  Marland Kitchen aspirin EC 81 MG tablet Take 81 mg by mouth daily. Swallow whole.  Marland Kitchen atorvastatin (LIPITOR) 80 MG tablet Take 1 tablet (80 mg total) by mouth daily.  . Calcium Carbonate-Vitamin D3 (CALCIUM 600-D) 600-400 MG-UNIT TABS Take 1 tablet by mouth 2 (two) times daily.  . cetirizine (ZYRTEC) 10 MG tablet Take 10 mg by mouth daily.  . Cholecalciferol (VITAMIN D) 125 MCG (5000 UT) CAPS Take 5,000 Units by mouth daily.  . clopidogrel (PLAVIX) 75 MG tablet Take 1 tablet (75 mg total) by mouth daily.  . diclofenac sodium (VOLTAREN) 1 % GEL Apply 2  g topically 3 (three) times daily.  Marland Kitchen diltiazem (CARDIZEM CD) 120 MG 24 hr capsule Take 1 capsule (120 mg total) by mouth daily.  . diphenhydrAMINE (BENADRYL) 25 MG tablet Take 25 mg by mouth every 6 (six) hours as needed for allergies.   Marland Kitchen docusate sodium (COLACE) 100 MG capsule Take 1 capsule (100 mg total) by mouth 2 (two) times daily.  Marland Kitchen escitalopram (LEXAPRO) 10 MG tablet Take 10 mg by mouth at bedtime.  . feeding supplement, ENSURE ENLIVE, (ENSURE ENLIVE) LIQD Take 237 mLs by mouth 3 (three) times daily between meals.  . ferrous sulfate 300 (60 Fe) MG/5ML syrup Take 300 mg by mouth in the morning and at bedtime.   . Fluticasone-Salmeterol (ADVAIR) 100-50 MCG/DOSE AEPB Inhale 1 puff into the lungs 2 (two) times daily.  . furosemide (LASIX) 20 MG tablet Take 20 mg by mouth daily.  Marland Kitchen guaifenesin (ROBITUSSIN) 100 MG/5ML syrup Take  300 mg by mouth 4 (four) times daily as needed for cough.  . loperamide (IMODIUM A-D) 2 MG tablet Take 2 mg by mouth daily as needed for diarrhea or loose stools.  . magnesium hydroxide (MILK OF MAGNESIA) 400 MG/5ML suspension Take 30 mLs by mouth daily as needed for mild constipation.  . metoCLOPramide (REGLAN) 5 MG tablet Take 5 mg by mouth every 4 (four) hours as needed for nausea. If ondansetron does not work, give reglan.  . metoprolol tartrate (LOPRESSOR) 25 MG tablet Take 6.25 mg by mouth 2 (two) times daily.  . Multiple Vitamin (MULTIVITAMIN WITH MINERALS) TABS tablet Take 1 tablet by mouth daily.  . nitroGLYCERIN (NITROSTAT) 0.4 MG SL tablet Place 1 tablet (0.4 mg total) under the tongue every 5 (five) minutes as needed for chest pain.  Marland Kitchen nystatin ointment (MYCOSTATIN) Apply 1 application topically 2 (two) times daily. Apply to lower back  . omeprazole (PRILOSEC) 20 MG capsule Take 20 mg by mouth daily.  . ondansetron (ZOFRAN) 4 MG tablet Take 1 tablet (4 mg total) by mouth every 6 (six) hours as needed for nausea. (Patient taking differently: Take 4 mg by mouth every 4 (four) hours as needed for nausea or vomiting. If zofran is ineffective, give reglan.)  . OXYGEN Inhale 2 L/min into the lungs continuous.   . potassium chloride SA (KLOR-CON) 20 MEQ tablet Take 40 mEq by mouth daily.  Marland Kitchen tiZANidine (ZANAFLEX) 4 MG tablet Take 4 mg by mouth daily.  . traMADol (ULTRAM) 50 MG tablet Take 50 mg by mouth every 8 (eight) hours.  . [DISCONTINUED] metoCLOPramide (REGLAN) 5 MG tablet Take 1-2 tablets (5-10 mg total) by mouth every 8 (eight) hours as needed for nausea (if ondansetron (ZOFRAN) ineffective.). (Patient taking differently: Take 5 mg by mouth every 8 (eight) hours as needed for nausea (if ondansetron (ZOFRAN) ineffective.). )  . albuterol (PROVENTIL) (2.5 MG/3ML) 0.083% nebulizer solution Take 2.5 mg by nebulization every 6 (six) hours as needed for wheezing or shortness of breath. (Patient  not taking: Reported on 03/12/2020)  . [DISCONTINUED] acetaminophen (TYLENOL) 500 MG tablet Take 500 mg by mouth every 8 (eight) hours as needed for moderate pain or fever. (Patient not taking: Reported on 03/12/2020)  . [DISCONTINUED] guaiFENesin (MUCINEX) 600 MG 12 hr tablet Take 600 mg by mouth daily.  . [DISCONTINUED] metoprolol succinate (TOPROL-XL) 25 MG 24 hr tablet Take 0.5 tablets (12.5 mg total) by mouth daily.  . [DISCONTINUED] miconazole (MONISTAT 7) 2 % vaginal cream Place 1 Applicatorful vaginally at bedtime.  . [DISCONTINUED] tolnaftate (ANTIFUNGAL)  1 % cream Apply 1 application topically as needed.    No facility-administered encounter medications on file as of 03/12/2020.     Jason Coop, NP , DNP, MPH, AGPCNP-BC, ACHPN  COVID-19 PATIENT SCREENING TOOL  Person answering questions: ____________staff______ _____   1.  Is the patient or any family member in the home showing any signs or symptoms regarding respiratory infection?               Person with Symptom- __________NA_________________  a. Fever                                                                          Yes___ No___          ___________________  b. Shortness of breath                                                    Yes___ No___          ___________________ c. Cough/congestion                                       Yes___  No___         ___________________ d. Body aches/pains                                                         Yes___ No___        ____________________ e. Gastrointestinal symptoms (diarrhea, nausea)           Yes___ No___        ____________________  2. Within the past 14 days, has anyone living in the home had any contact with someone with or under investigation for COVID-19?    Yes___ No_X_   Person __________________

## 2020-03-21 ENCOUNTER — Emergency Department: Payer: Medicare Other

## 2020-03-21 ENCOUNTER — Other Ambulatory Visit: Payer: Self-pay

## 2020-03-21 ENCOUNTER — Inpatient Hospital Stay
Admission: EM | Admit: 2020-03-21 | Discharge: 2020-03-25 | DRG: 871 | Disposition: A | Discharge status: Skilled Nursing Facility | Payer: Medicare Other | Attending: Internal Medicine | Admitting: Internal Medicine

## 2020-03-21 DIAGNOSIS — E872 Acidosis, unspecified: Secondary | ICD-10-CM

## 2020-03-21 DIAGNOSIS — J189 Pneumonia, unspecified organism: Secondary | ICD-10-CM | POA: Diagnosis present

## 2020-03-21 DIAGNOSIS — R32 Unspecified urinary incontinence: Secondary | ICD-10-CM | POA: Diagnosis present

## 2020-03-21 DIAGNOSIS — K625 Hemorrhage of anus and rectum: Secondary | ICD-10-CM | POA: Diagnosis present

## 2020-03-21 DIAGNOSIS — J440 Chronic obstructive pulmonary disease with acute lower respiratory infection: Secondary | ICD-10-CM | POA: Diagnosis present

## 2020-03-21 DIAGNOSIS — Z66 Do not resuscitate: Secondary | ICD-10-CM | POA: Diagnosis present

## 2020-03-21 DIAGNOSIS — N179 Acute kidney failure, unspecified: Secondary | ICD-10-CM | POA: Diagnosis present

## 2020-03-21 DIAGNOSIS — Z888 Allergy status to other drugs, medicaments and biological substances status: Secondary | ICD-10-CM

## 2020-03-21 DIAGNOSIS — J9621 Acute and chronic respiratory failure with hypoxia: Secondary | ICD-10-CM | POA: Diagnosis present

## 2020-03-21 DIAGNOSIS — L89322 Pressure ulcer of left buttock, stage 2: Secondary | ICD-10-CM | POA: Diagnosis present

## 2020-03-21 DIAGNOSIS — R0602 Shortness of breath: Secondary | ICD-10-CM

## 2020-03-21 DIAGNOSIS — G9340 Encephalopathy, unspecified: Secondary | ICD-10-CM | POA: Diagnosis present

## 2020-03-21 DIAGNOSIS — J449 Chronic obstructive pulmonary disease, unspecified: Secondary | ICD-10-CM | POA: Diagnosis not present

## 2020-03-21 DIAGNOSIS — I252 Old myocardial infarction: Secondary | ICD-10-CM

## 2020-03-21 DIAGNOSIS — H9193 Unspecified hearing loss, bilateral: Secondary | ICD-10-CM | POA: Diagnosis not present

## 2020-03-21 DIAGNOSIS — Z7902 Long term (current) use of antithrombotics/antiplatelets: Secondary | ICD-10-CM

## 2020-03-21 DIAGNOSIS — H913 Deaf nonspeaking, not elsewhere classified: Secondary | ICD-10-CM | POA: Diagnosis present

## 2020-03-21 DIAGNOSIS — I444 Left anterior fascicular block: Secondary | ICD-10-CM | POA: Diagnosis present

## 2020-03-21 DIAGNOSIS — Z885 Allergy status to narcotic agent status: Secondary | ICD-10-CM

## 2020-03-21 DIAGNOSIS — R6520 Severe sepsis without septic shock: Secondary | ICD-10-CM | POA: Diagnosis present

## 2020-03-21 DIAGNOSIS — I480 Paroxysmal atrial fibrillation: Secondary | ICD-10-CM | POA: Diagnosis present

## 2020-03-21 DIAGNOSIS — A419 Sepsis, unspecified organism: Secondary | ICD-10-CM | POA: Diagnosis present

## 2020-03-21 DIAGNOSIS — L89159 Pressure ulcer of sacral region, unspecified stage: Secondary | ICD-10-CM

## 2020-03-21 DIAGNOSIS — D649 Anemia, unspecified: Secondary | ICD-10-CM

## 2020-03-21 DIAGNOSIS — R4182 Altered mental status, unspecified: Secondary | ICD-10-CM | POA: Diagnosis not present

## 2020-03-21 DIAGNOSIS — Z20822 Contact with and (suspected) exposure to covid-19: Secondary | ICD-10-CM | POA: Diagnosis present

## 2020-03-21 DIAGNOSIS — J9611 Chronic respiratory failure with hypoxia: Secondary | ICD-10-CM | POA: Diagnosis not present

## 2020-03-21 DIAGNOSIS — M810 Age-related osteoporosis without current pathological fracture: Secondary | ICD-10-CM | POA: Diagnosis present

## 2020-03-21 DIAGNOSIS — L89600 Pressure ulcer of unspecified heel, unstageable: Secondary | ICD-10-CM | POA: Diagnosis present

## 2020-03-21 DIAGNOSIS — Z7983 Long term (current) use of bisphosphonates: Secondary | ICD-10-CM

## 2020-03-21 DIAGNOSIS — I5032 Chronic diastolic (congestive) heart failure: Secondary | ICD-10-CM | POA: Diagnosis present

## 2020-03-21 DIAGNOSIS — I110 Hypertensive heart disease with heart failure: Secondary | ICD-10-CM | POA: Diagnosis present

## 2020-03-21 DIAGNOSIS — K2100 Gastro-esophageal reflux disease with esophagitis, without bleeding: Secondary | ICD-10-CM | POA: Insufficient documentation

## 2020-03-21 DIAGNOSIS — Z79899 Other long term (current) drug therapy: Secondary | ICD-10-CM

## 2020-03-21 DIAGNOSIS — J441 Chronic obstructive pulmonary disease with (acute) exacerbation: Secondary | ICD-10-CM | POA: Diagnosis present

## 2020-03-21 DIAGNOSIS — R21 Rash and other nonspecific skin eruption: Secondary | ICD-10-CM | POA: Diagnosis present

## 2020-03-21 DIAGNOSIS — Z882 Allergy status to sulfonamides status: Secondary | ICD-10-CM

## 2020-03-21 DIAGNOSIS — N390 Urinary tract infection, site not specified: Secondary | ICD-10-CM | POA: Diagnosis present

## 2020-03-21 DIAGNOSIS — R0600 Dyspnea, unspecified: Secondary | ICD-10-CM

## 2020-03-21 DIAGNOSIS — L8990 Pressure ulcer of unspecified site, unspecified stage: Secondary | ICD-10-CM | POA: Diagnosis present

## 2020-03-21 DIAGNOSIS — Z7982 Long term (current) use of aspirin: Secondary | ICD-10-CM

## 2020-03-21 DIAGNOSIS — E875 Hyperkalemia: Secondary | ICD-10-CM | POA: Diagnosis present

## 2020-03-21 DIAGNOSIS — H9190 Unspecified hearing loss, unspecified ear: Secondary | ICD-10-CM

## 2020-03-21 DIAGNOSIS — Z9981 Dependence on supplemental oxygen: Secondary | ICD-10-CM

## 2020-03-21 DIAGNOSIS — L89151 Pressure ulcer of sacral region, stage 1: Secondary | ICD-10-CM | POA: Diagnosis not present

## 2020-03-21 LAB — URINALYSIS, COMPLETE (UACMP) WITH MICROSCOPIC
Bilirubin Urine: NEGATIVE
Glucose, UA: NEGATIVE mg/dL
Hgb urine dipstick: NEGATIVE
Ketones, ur: 20 mg/dL — AB
Nitrite: NEGATIVE
Protein, ur: NEGATIVE mg/dL
Specific Gravity, Urine: 1.017 (ref 1.005–1.030)
pH: 5 (ref 5.0–8.0)

## 2020-03-21 LAB — RESPIRATORY PANEL BY RT PCR (FLU A&B, COVID)
Influenza A by PCR: NEGATIVE
Influenza B by PCR: NEGATIVE
SARS Coronavirus 2 by RT PCR: NEGATIVE

## 2020-03-21 LAB — HEPATIC FUNCTION PANEL
ALT: 37 U/L (ref 0–44)
AST: 35 U/L (ref 15–41)
Albumin: 3.1 g/dL — ABNORMAL LOW (ref 3.5–5.0)
Alkaline Phosphatase: 185 U/L — ABNORMAL HIGH (ref 38–126)
Bilirubin, Direct: 0.2 mg/dL (ref 0.0–0.2)
Indirect Bilirubin: 1.4 mg/dL — ABNORMAL HIGH (ref 0.3–0.9)
Total Bilirubin: 1.6 mg/dL — ABNORMAL HIGH (ref 0.3–1.2)
Total Protein: 6.8 g/dL (ref 6.5–8.1)

## 2020-03-21 LAB — BASIC METABOLIC PANEL
Anion gap: 15 (ref 5–15)
Anion gap: 15 (ref 5–15)
BUN: 42 mg/dL — ABNORMAL HIGH (ref 8–23)
BUN: 37 mg/dL — ABNORMAL HIGH (ref 8–23)
CO2: 17 mmol/L — ABNORMAL LOW (ref 22–32)
CO2: 18 mmol/L — ABNORMAL LOW (ref 22–32)
Calcium: 8.2 mg/dL — ABNORMAL LOW (ref 8.9–10.3)
Calcium: 7.5 mg/dL — ABNORMAL LOW (ref 8.9–10.3)
Chloride: 101 mmol/L (ref 98–111)
Chloride: 100 mmol/L (ref 98–111)
Creatinine, Ser: 1.26 mg/dL — ABNORMAL HIGH (ref 0.44–1.00)
Creatinine, Ser: 1.07 mg/dL — ABNORMAL HIGH (ref 0.44–1.00)
GFR, Estimated: 43 mL/min — ABNORMAL LOW (ref >60)
GFR, Estimated: 53 mL/min — ABNORMAL LOW (ref >60)
Glucose, Bld: 104 mg/dL — ABNORMAL HIGH (ref 70–99)
Glucose, Bld: 89 mg/dL (ref 70–99)
Potassium: 5.7 mmol/L — ABNORMAL HIGH (ref 3.5–5.1)
Potassium: 5.1 mmol/L (ref 3.5–5.1)
Sodium: 133 mmol/L — ABNORMAL LOW (ref 135–145)
Sodium: 133 mmol/L — ABNORMAL LOW (ref 135–145)

## 2020-03-21 LAB — CBC
HCT: 32.3 % — ABNORMAL LOW (ref 36.0–46.0)
Hemoglobin: 10 g/dL — ABNORMAL LOW (ref 12.0–15.0)
MCH: 27.7 pg (ref 26.0–34.0)
MCHC: 31 g/dL (ref 30.0–36.0)
MCV: 89.5 fL (ref 80.0–100.0)
Platelets: 510 10*3/uL — ABNORMAL HIGH (ref 150–400)
RBC: 3.61 MIL/uL — ABNORMAL LOW (ref 3.87–5.11)
RDW: 16.8 % — ABNORMAL HIGH (ref 11.5–15.5)
WBC: 28 10*3/uL — ABNORMAL HIGH (ref 4.0–10.5)
nRBC: 0 % (ref 0.0–0.2)

## 2020-03-21 LAB — LACTIC ACID, PLASMA
Lactic Acid, Venous: 1.1 mmol/L (ref 0.5–1.9)
Lactic Acid, Venous: 1.2 mmol/L (ref 0.5–1.9)

## 2020-03-21 LAB — PROTIME-INR
INR: 1.1 (ref 0.8–1.2)
Prothrombin Time: 13.3 seconds (ref 11.4–15.2)

## 2020-03-21 LAB — TROPONIN I (HIGH SENSITIVITY)
Troponin I (High Sensitivity): 40 ng/L — ABNORMAL HIGH (ref <18)
Troponin I (High Sensitivity): 44 ng/L — ABNORMAL HIGH (ref <18)

## 2020-03-21 LAB — APTT: aPTT: 32 seconds (ref 24–36)

## 2020-03-21 LAB — BRAIN NATRIURETIC PEPTIDE: B Natriuretic Peptide: 383.4 pg/mL — ABNORMAL HIGH (ref 0.0–100.0)

## 2020-03-21 MED ORDER — SENNOSIDES-DOCUSATE SODIUM 8.6-50 MG PO TABS: 1.0000 | ORAL_TABLET | Freq: Every evening | ORAL | Status: DC | PRN

## 2020-03-21 MED ORDER — ONDANSETRON HCL 4 MG/2ML IJ SOLN: 4.0000 mg | Freq: Four times a day (QID) | INTRAMUSCULAR | Status: DC | PRN

## 2020-03-21 MED ORDER — SODIUM CHLORIDE 0.9 % IV SOLN
2.0000 g | INTRAVENOUS | Status: DC
  Administered 2020-03-22 – 2020-03-24 (×3): 2 g via INTRAVENOUS
  Filled 2020-03-21 (×2): qty 20
  Filled 2020-03-21 (×2): qty 2

## 2020-03-21 MED ORDER — SODIUM CHLORIDE 0.9 % IV SOLN: INTRAVENOUS | Status: DC

## 2020-03-21 MED ORDER — SODIUM CHLORIDE 0.9 % IV SOLN
500.0000 mg | INTRAVENOUS | Status: DC
  Administered 2020-03-21: 500 mg via INTRAVENOUS
  Filled 2020-03-21: qty 500

## 2020-03-21 MED ORDER — LACTATED RINGERS IV BOLUS (SEPSIS)
500.0000 mL | Freq: Once | INTRAVENOUS | Status: AC
  Administered 2020-03-21: 500 mL via INTRAVENOUS

## 2020-03-21 MED ORDER — ONDANSETRON HCL 4 MG PO TABS: 4.0000 mg | ORAL_TABLET | Freq: Four times a day (QID) | ORAL | Status: DC | PRN

## 2020-03-21 MED ORDER — SODIUM CHLORIDE 0.9 % IV SOLN
2.0000 g | INTRAVENOUS | Status: DC
  Administered 2020-03-21: 2 g via INTRAVENOUS
  Filled 2020-03-21: qty 20

## 2020-03-21 MED ORDER — HYDROCODONE-ACETAMINOPHEN 5-325 MG PO TABS
1.0000 | ORAL_TABLET | ORAL | Status: DC | PRN
  Administered 2020-03-21: 1 via ORAL
  Administered 2020-03-25: 2 via ORAL
  Filled 2020-03-21: qty 1
  Filled 2020-03-21: qty 2

## 2020-03-21 MED ORDER — ACETAMINOPHEN 325 MG PO TABS: 650.0000 mg | ORAL_TABLET | Freq: Four times a day (QID) | ORAL | Status: DC | PRN

## 2020-03-21 MED ORDER — ACETAMINOPHEN 650 MG RE SUPP: 650.0000 mg | Freq: Four times a day (QID) | RECTAL | Status: DC | PRN

## 2020-03-21 MED ORDER — LACTATED RINGERS IV BOLUS (SEPSIS)
1000.0000 mL | Freq: Once | INTRAVENOUS | Status: AC
  Administered 2020-03-21: 1000 mL via INTRAVENOUS

## 2020-03-21 MED ORDER — ENOXAPARIN SODIUM 30 MG/0.3ML ~~LOC~~ SOLN
30.0000 mg | SUBCUTANEOUS | Status: DC
  Administered 2020-03-21: 30 mg via SUBCUTANEOUS
  Filled 2020-03-21 (×2): qty 0.3

## 2020-03-21 MED ORDER — SODIUM CHLORIDE 0.9 % IV SOLN
500.0000 mg | INTRAVENOUS | Status: DC
  Administered 2020-03-22 – 2020-03-24 (×3): 500 mg via INTRAVENOUS
  Filled 2020-03-21 (×4): qty 500

## 2020-03-21 NOTE — ED Notes (Addendum)
Patient complaining of chest pain, even with ASL interpreter this RN unable to clarify with the patient if this is new chest pain or due to cough. Patient declining medication for pain. Repeat ECG completed. No change in VS. Patient does not appear to be in any distress. MD Para March made aware.

## 2020-03-21 NOTE — Progress Notes (Signed)
Following pt for sepsis protocol from [facility]

## 2020-03-21 NOTE — ED Notes (Signed)
Full linen change and peri-care, patient repositioned to right side.

## 2020-03-21 NOTE — ED Triage Notes (Addendum)
Pt to ED via [facility] who was called by [facility] facility where pt resides. Pt has "skin tears on inner buttock area that are bleeding. EMS unsure whether bleeding coming from wounds or rectum. Pt was afebrile per EMS. Pt is deaf and is able to communicate via writing. Pt is on 4L oxygen at home. Pt placed on 4l PER [place], spo2 REMAINING 89-90%.

## 2020-03-21 NOTE — ED Provider Notes (Signed)
[facility] Emergency Department Provider Note   ____________________________________________   First MD Initiated Contact with Patient 03/21/20 1703     (approximate)  I have reviewed the triage vital signs and the nursing notes.   HISTORY  Chief Complaint Rectal Bleeding    HPI Alice Wallace is a 79 y.o. female with possible history of hypertension, COPD, diastolic CHF, and deafness who presents to the ED for skin wounds.  History is very limited as patient is deaf, does not know sign language and communicates primarily through writing.  Per EMS, patient recently transition from rehab facility to long-term care and they have noticed wounds to her backside.  They became concerned earlier today when she started having bleeding from these areas.  On arrival, patient appears short of breath, endorses recent cough and pain in her chest.  She denies recent fevers.  She does complain of pain to the area of wounds on her backside as well.        Past Medical History:  Diagnosis Date  . COPD (chronic obstructive pulmonary disease) (HCC)   . Deaf   . Hypertension   . Osteoporosis     Patient Active Problem List   Diagnosis Date Noted  . Gastroesophageal reflux disease with esophagitis 03/21/2020  . Pressure injury of left buttock, stage 2 (HCC) 03/21/2020  . Chronic respiratory failure with hypoxia (HCC) 03/21/2020  . Metabolic acidosis 03/21/2020  . Hyperkalemia 03/21/2020  . Chronic anemia 03/21/2020  . 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  . CAP (community acquired pneumonia) 03/21/2020  . Sepsis (HCC) 03/21/2020  . Malnutrition of moderate degree 02/03/2020  . Chronic diastolic CHF (congestive heart failure) (HCC) 02/03/2020  . Pressure injury of skin 02/01/2020  . Nondisplaced fracture of base of neck of right femur, initial encounter for closed fracture (HCC)   . Goals of care, counseling/discussion   . Palliative care by specialist     . Hip fracture, unspecified laterality, closed, initial encounter (HCC) 01/26/2020  . Chronic obstructive pulmonary disease with acute exacerbation (HCC) 01/21/2020  . Non-STEMI (non-ST elevated myocardial infarction) (HCC) 01/21/2020  . Acute respiratory failure with hypoxia and hypercapnia (HCC)   . Pneumonia due to COVID-19 virus 06/07/2019  . Hypokalemia 06/07/2019  . AKI (acute kidney injury) (HCC) 06/07/2019  . Deafness 06/07/2019  . COVID-19 virus infection 05/27/2019  . Essential hypertension 11/29/2018    Past Surgical History:  Procedure Laterality Date  . INTRAMEDULLARY (IM) NAIL INTERTROCHANTERIC Right 01/26/2020   Procedure: Right Hip IM nail with TFNA;  Surgeon: Lyndle Herrlich, MD;  Location: [facility] ORS;  Service: Orthopedics;  Laterality: Right;    Prior to Admission medications   Medication Sig Start Date End Date Taking? Authorizing Provider  acetaminophen (TYLENOL) 325 MG tablet Take 2 tablets (650 mg total) by mouth every 6 (six) hours. Patient taking differently: Take 650 mg by mouth every 6 (six) hours. Not to exceed 4 grams / 24 hours 02/03/20   Esaw Grandchild A, DO  acidophilus (RISAQUAD) CAPS capsule Take 1 capsule by mouth 3 (three) times daily. 02/03/20   Esaw Grandchild A, DO  albuterol (PROVENTIL) (2.5 MG/3ML) 0.083% nebulizer solution Take 2.5 mg by nebulization every 6 (six) hours as needed for wheezing or shortness of breath. Patient not taking: Reported on 03/12/2020    [provider]  albuterol (VENTOLIN HFA) 108 (90 Base) MCG/ACT inhaler Inhale 2 puffs into the lungs every 4 (four) hours as needed for wheezing or shortness of breath.  [provider]  alendronate (FOSAMAX) 70 MG tablet Take 70 mg by mouth once a week. Take with a full glass of water on an empty stomach.    [provider]  aluminum-magnesium hydroxide-simethicone (MAALOX) 200-200-20 MG/5ML SUSP Take 30 mLs by mouth 4 (four) times daily -  before meals and at  bedtime.    [provider]  Amino Acids-Protein Hydrolys (FEEDING SUPPLEMENT, PRO-STAT SUGAR FREE 64,) LIQD Take 30 mLs by mouth 2 (two) times daily.    [provider]  ascorbic acid (VITAMIN C) 500 MG tablet Take 500 mg by mouth daily.    [provider]  aspirin EC 81 MG tablet Take 81 mg by mouth daily. Swallow whole.    [provider]  atorvastatin (LIPITOR) 80 MG tablet Take 1 tablet (80 mg total) by mouth daily. 02/04/20   Pennie Banter, DO  Calcium Carbonate-Vitamin D3 (CALCIUM 600-D) 600-400 MG-UNIT TABS Take 1 tablet by mouth 2 (two) times daily.    [provider]  cetirizine (ZYRTEC) 10 MG tablet Take 10 mg by mouth daily.    [provider]  Cholecalciferol (VITAMIN D) 125 MCG (5000 UT) CAPS Take 5,000 Units by mouth daily.    [provider]  clopidogrel (PLAVIX) 75 MG tablet Take 1 tablet (75 mg total) by mouth daily. 02/04/20   Esaw Grandchild A, DO  diclofenac sodium (VOLTAREN) 1 % GEL Apply 2 g topically 3 (three) times daily. 01/03/19   [provider]  diltiazem (CARDIZEM CD) 120 MG 24 hr capsule Take 1 capsule (120 mg total) by mouth daily. 02/04/20   Pennie Banter, DO  diphenhydrAMINE (BENADRYL) 25 MG tablet Take 25 mg by mouth every 6 (six) hours as needed for allergies.     [provider]  docusate sodium (COLACE) 100 MG capsule Take 1 capsule (100 mg total) by mouth 2 (two) times daily. 02/03/20   Esaw Grandchild A, DO  escitalopram (LEXAPRO) 10 MG tablet Take 10 mg by mouth at bedtime. 06/08/16   [provider]  feeding supplement, ENSURE ENLIVE, (ENSURE ENLIVE) LIQD Take 237 mLs by mouth 3 (three) times daily between meals. 06/10/19   Enedina Finner, MD  ferrous sulfate 300 (60 Fe) MG/5ML syrup Take 300 mg by mouth in the morning and at bedtime.     [provider]  Fluticasone-Salmeterol (ADVAIR) 100-50 MCG/DOSE AEPB Inhale 1 puff into the lungs 2 (two) times daily.     [provider]  furosemide (LASIX) 20 MG tablet Take 20 mg by mouth daily.    [provider]  guaifenesin (ROBITUSSIN) 100 MG/5ML syrup Take 300 mg by mouth 4 (four) times daily as needed for cough.    [provider]  loperamide (IMODIUM A-D) 2 MG tablet Take 2 mg by mouth daily as needed for diarrhea or loose stools.    [provider]  magnesium hydroxide (MILK OF MAGNESIA) 400 MG/5ML suspension Take 30 mLs by mouth daily as needed for mild constipation.    [provider]  metoCLOPramide (REGLAN) 5 MG tablet Take 5 mg by mouth every 4 (four) hours as needed for nausea. If ondansetron does not work, give reglan.    [provider]  metoprolol tartrate (LOPRESSOR) 25 MG tablet Take 6.25 mg by mouth 2 (two) times daily.    [provider]  Multiple Vitamin (MULTIVITAMIN WITH MINERALS) TABS tablet Take 1 tablet by mouth daily. 06/11/19   Enedina Finner, MD  nitroGLYCERIN Jearld Shines)  0.4 MG SL tablet Place 1 tablet (0.4 mg total) under the tongue every 5 (five) minutes as needed for chest pain. 02/03/20   Pennie BanterGriffith, Kelly A, DO  nystatin ointment (MYCOSTATIN) Apply 1 application topically 2 (two) times daily. Apply to lower back    [provider]  omeprazole (PRILOSEC) 20 MG capsule Take 20 mg by mouth daily.    [provider]  ondansetron (ZOFRAN) 4 MG tablet Take 1 tablet (4 mg total) by mouth every 6 (six) hours as needed for nausea. Patient taking differently: Take 4 mg by mouth every 4 (four) hours as needed for nausea or vomiting. If zofran is ineffective, give reglan. 02/03/20   Esaw GrandchildGriffith, Kelly A, DO  OXYGEN Inhale 2 L/min into the lungs continuous.     [provider]  potassium chloride SA (KLOR-CON) 20 MEQ tablet Take 40 mEq by mouth daily.    [provider]  tiZANidine (ZANAFLEX) 4 MG tablet Take 4 mg by mouth daily.    [provider]  traMADol (ULTRAM) 50 MG tablet Take 50 mg by mouth  every 8 (eight) hours.    [provider]    Allergies Hydrocodone, Naproxen, Other, Sulfamethoxazole-trimethoprim, and Tramadol  No family history on file.  Social History Social History   Tobacco Use  . Smoking status: Never Smoker  . Smokeless tobacco: Never Used  Substance Use Topics  . Alcohol use: Not Currently  . Drug use: Not Currently    Review of Systems  Constitutional: No fever/chills Eyes: No visual changes. ENT: No sore throat. Cardiovascular: Positive for chest pain. Respiratory: Positive for cough and shortness of breath. Gastrointestinal: No abdominal pain.  No nausea, no vomiting.  No diarrhea.  No constipation. Genitourinary: Negative for dysuria. Musculoskeletal: Negative for back pain. Skin: Negative for rash.  Positive for wound. Neurological: Negative for headaches, focal weakness or numbness.  ____________________________________________   PHYSICAL EXAM:  VITAL SIGNS: ED Triage Vitals  Enc Vitals Group     BP 03/21/20 1700 (!) 148/60     Pulse Rate 03/21/20 1700 100     Resp 03/21/20 1730 (!) 25     Temp 03/21/20 1700 (!) 97.5 F (36.4 C)     Temp Source 03/21/20 1700 Oral     SpO2 03/21/20 1700 90 %     Weight 03/21/20 1701 110 lb (49.9 kg)     Height 03/21/20 1701 5\' 5"  (1.651 m)     Head Circumference --      Peak Flow --      Pain Score --      Pain Loc --      Pain Edu? --      Excl. in GC? --     Constitutional: Alert and oriented. Eyes: Conjunctivae are normal. Head: Atraumatic. Nose: No congestion/rhinnorhea. Mouth/Throat: Mucous membranes are moist. Neck: Normal ROM Cardiovascular: Normal rate, regular rhythm. Grossly normal heart sounds. Respiratory: Tachypneic with increased respiratory effort and retractions. Lungs with crackles and rhonchi to bilateral bases. Gastrointestinal: Soft and nontender. No distention. Genitourinary: deferred Musculoskeletal: No lower extremity tenderness, 2+ pitting edema to  thighs bilaterally. Neurologic:  Normal speech and language. No gross focal neurologic deficits are appreciated. Skin: Pressure injuries noted to sacral area as well as bilateral posterior thighs with scant bleeding.  No erythema, warmth, or purulence. Psychiatric: Mood and affect are normal. Speech and behavior are normal.  ____________________________________________   LABS (all labs ordered are listed, but only abnormal results are displayed)  Labs Reviewed  BASIC METABOLIC PANEL - Abnormal; Notable for the following components:      Result Value   Sodium 133 (*)    Potassium 5.7 (*)    CO2 17 (*)    Glucose, Bld 104 (*)    BUN 42 (*)    Creatinine, Ser 1.26 (*)    Calcium 8.2 (*)    GFR, Estimated 43 (*)    All other components within normal limits  CBC - Abnormal; Notable for the following components:   WBC 28.0 (*)    RBC 3.61 (*)    Hemoglobin 10.0 (*)    HCT 32.3 (*)    RDW 16.8 (*)    Platelets 510 (*)    All other components within normal limits  BRAIN NATRIURETIC PEPTIDE - Abnormal; Notable for the following components:   B Natriuretic Peptide 383.4 (*)    All other components within normal limits  HEPATIC FUNCTION PANEL - Abnormal; Notable for the following components:   Albumin 3.1 (*)    Alkaline Phosphatase 185 (*)    Total Bilirubin 1.6 (*)    Indirect Bilirubin 1.4 (*)    All other components within normal limits  TROPONIN I (HIGH SENSITIVITY) - Abnormal; Notable for the following components:   Troponin I (High Sensitivity) 40 (*)    All other components within normal limits  RESPIRATORY PANEL BY RT PCR (FLU A&B, COVID)  CULTURE, BLOOD (ROUTINE X 2)  CULTURE, BLOOD (ROUTINE X 2)  URINE CULTURE  LACTIC ACID, PLASMA  PROTIME-INR  APTT  LACTIC ACID, PLASMA  URINALYSIS, COMPLETE (UACMP) WITH MICROSCOPIC  TROPONIN I (HIGH SENSITIVITY)   ____________________________________________  EKG  ED ECG REPORT I, Chesley Noon, the attending physician,  personally viewed and interpreted this ECG.   Date: 03/21/2020  EKG Time: 17:18  Rate: 101  Rhythm: sinus tachycardia  Axis: LAD  Intervals:left anterior fascicular block  ST&T Change: Nonspecific T wave changes   PROCEDURES  Procedure(s) performed (including Critical Care):  .Critical Care Performed by: Chesley Noon, MD Authorized by: Chesley Noon, MD   Critical care provider statement:    Critical care time (minutes):  45   Critical care time was exclusive of:  Separately billable procedures and treating other patients and teaching time   Critical care was necessary to treat or prevent imminent or life-threatening deterioration of the following conditions:  Sepsis   Critical care was time spent personally by me on the following activities:  Discussions with consultants, evaluation of patient's response to treatment, examination of patient, ordering and performing treatments and interventions, ordering and review of laboratory studies, ordering and review of radiographic studies, pulse oximetry, re-evaluation of patient's condition, obtaining history from patient or surrogate and review of old charts   I assumed direction of critical care for this patient from another provider in my specialty: no       ____________________________________________   INITIAL IMPRESSION / ASSESSMENT AND PLAN / ED COURSE      79 year old female with past medical history of hypertension, COPD, diastolic CHF, and deafness who presents to the ED due to concern for bleeding wounds to her backside.  She does appear to have bleeding pressure injuries to her sacral area and posterior thighs, but these do not appear infected.  Of greater concern, patient does appear to be having difficulty breathing and at this point I am concerned for pneumonia and sepsis given her leukocytosis, tachycardia, and tachypnea.  We will start patient on Rocephin and azithromycin, hydrate with IV fluids  and collect blood  cultures.  Her creatinine is also elevated compared to previous, likely secondary to sepsis and dehydration.  This is associated with mild elevation in potassium, but no EKG changes noted and this should improve with IV fluid bolus.  Chest x-ray reviewed by me and shows infiltrate at bilateral bases concerning for pneumonia.  Patient started on Rocephin and azithromycin for apparent pneumonia.  Case discussed with hospitalist for admission.      ____________________________________________   FINAL CLINICAL IMPRESSION(S) / ED DIAGNOSES  Final diagnoses:  Sepsis with acute hypoxic respiratory failure without septic shock, due to unspecified organism [facility])  Community acquired pneumonia, unspecified laterality  Pressure injury of skin of sacral region, unspecified injury stage     ED Discharge Orders    None       Note:  This document was prepared using Dragon voice recognition software and may include unintentional dictation errors.   Chesley Noon, MD 03/21/20 [phone]

## 2020-03-21 NOTE — ED Notes (Signed)
Sacral foam applied. Stage 2 PI over buttocks area (widespread) and bilateral upper thighs.

## 2020-03-21 NOTE — Progress Notes (Signed)
PHARMACIST - PHYSICIAN COMMUNICATION  CONCERNING:  Enoxaparin (Lovenox) for DVT Prophylaxis    RECOMMENDATION: Patient was prescribed enoxaparin 40mg  q24 hours for VTE prophylaxis.   Filed Weights   03/21/20 1701  Weight: 49.9 kg (110 lb)    Body mass index is 18.3 kg/m.  Estimated Creatinine Clearance: 28.5 mL/min (A) (by C-G formula based on SCr of 1.26 mg/dL (H)).   Patient is candidate for enoxaparin 30mg  every 24 hours based on CrCl <48ml/min or Weight <45kg  DESCRIPTION: Pharmacy has adjusted enoxaparin dose per [facility] policy.  Patient is now receiving enoxaparin 30 mg every 24 hours    31m 03/21/2020 7:45 PM

## 2020-03-21 NOTE — ED Notes (Signed)
EDP wants triage order sets implemented for SOB, pt is 89-90% on 4L oxygen. Orders entered per provider. Pt resting in bed, instructed to breathe deeply. Pt understands written communication.

## 2020-03-21 NOTE — Progress Notes (Signed)
CODE SEPSIS - PHARMACY COMMUNICATION  **Broad Spectrum Antibiotics should be administered within 1 hour of Sepsis diagnosis**  Time Code Sepsis Called/Page Received: 1818  Antibiotics Ordered: ceftriaxone / azithromycin  Time of 1st antibiotic administration: 1852  Additional action taken by pharmacy: N/A  Alice Wallace 03/21/2020  6:54 PM

## 2020-03-21 NOTE — H&P (Addendum)
History and Physical    Alice Wallace IRC:[id number] DOB: 1940/08/10 DOA: 03/21/2020  PCP: Juluis Pitch, MD   Patient coming from: SNF  I have personally briefly reviewed patient's old medical records in [place]  Chief Complaint: Rash on buttocks  HPI: Alice Wallace is a 79 y.o. female with medical history significant for COPD, chronic hypoxic respiratory failure on home O2 at 4 L/min, grade 1 diastolic dysfunction, last EF 60 to 65% September 2021, deaf mutism, chronic anemia, recently hospitalized in September for hip fracture during which she had NSTEMI versus demand ischemia per cardiology, who was sent into the emergency room for evaluation because of bleeding wounds on the buttocks.  History is limited due to the difficulty with communication as patient does not know sign language and communicates through reading written questions and nodding or shaking her head. ED course: On arrival she was afebrile but was tachycardic at 99 and tachypneic at 26 with O2 sat 90% on O2 at her home flow rate of 4.  Blood work revealed WBC of 28,000 with lactic acid 1.1.   hemoglobin of 10, baseline around 8.  BMP revealed creatinine of 1.26 with hyperkalemia of 5.7 and metabolic acidosis with bicarb of 17.  Troponin 40, BNP 383.  She had slight elevation in liver enzymes with alk phos 185 and total bili 1.6.  Transaminases normal.  Chest x-ray showed patchy airspace consolidation versus atelectasis in bilateral lower lobes. EKG as interpreted by me: Sinus tachycardia at 101 with nonspecific ST-T wave changes. Patient started on sepsis fluid bolus for treatment of sepsis secondary to pneumonia as well as IV antibiotics.  Hospitalist consulted for admission.    Review of Systems: May be unreliable due to difficulty with clinical condition but patient denying complaints on 12 system review except for those mentioned above in HPI   Past Medical History:  Diagnosis Date  . COPD (chronic  obstructive pulmonary disease) ([place])   . Deaf   . Hypertension   . Osteoporosis     Past Surgical History:  Procedure Laterality Date  . INTRAMEDULLARY (IM) NAIL INTERTROCHANTERIC Right 01/26/2020   Procedure: Right Hip IM nail with TFNA;  Surgeon: Lovell Sheehan, MD;  Location: [facility] ORS;  Service: Orthopedics;  Laterality: Right;     reports that she has never smoked. She has never used smokeless tobacco. She reports previous alcohol use. She reports previous drug use.  Allergies  Allergen Reactions  . Hydrocodone     Other reaction(s): Unknown  . Naproxen     Other reaction(s): Unknown  . Other Other (See Comments)    nuts  . Sulfamethoxazole-Trimethoprim     Other reaction(s): Unknown  . Tramadol     Other reaction(s): Unknown Pt has tolerated this med 1/5-1/7 with no issues   Family history: Unable to obtain due to communication difficulties above   Prior to Admission medications   Medication Sig Start Date End Date Taking? Authorizing Provider  acetaminophen (TYLENOL) 325 MG tablet Take 2 tablets (650 mg total) by mouth every 6 (six) hours. Patient taking differently: Take 650 mg by mouth every 6 (six) hours. Not to exceed 4 grams / 24 hours 02/03/20   Nicole Kindred A, DO  acidophilus (RISAQUAD) CAPS capsule Take 1 capsule by mouth 3 (three) times daily. 02/03/20   Nicole Kindred A, DO  albuterol (PROVENTIL) (2.5 MG/3ML) 0.083% nebulizer solution Take 2.5 mg by nebulization every 6 (six) hours as needed for wheezing or shortness of breath. Patient not  taking: Reported on 03/12/2020    [provider]  albuterol (VENTOLIN HFA) 108 (90 Base) MCG/ACT inhaler Inhale 2 puffs into the lungs every 4 (four) hours as needed for wheezing or shortness of breath.    [provider]  alendronate (FOSAMAX) 70 MG tablet Take 70 mg by mouth once a week. Take with a full glass of water on an empty stomach.    [provider]  aluminum-magnesium  hydroxide-simethicone (MAALOX) 200-200-20 MG/5ML SUSP Take 30 mLs by mouth 4 (four) times daily -  before meals and at bedtime.    [provider]  Amino Acids-Protein Hydrolys (FEEDING SUPPLEMENT, PRO-STAT SUGAR FREE 64,) LIQD Take 30 mLs by mouth 2 (two) times daily.    [provider]  ascorbic acid (VITAMIN C) 500 MG tablet Take 500 mg by mouth daily.    [provider]  aspirin EC 81 MG tablet Take 81 mg by mouth daily. Swallow whole.    [provider]  atorvastatin (LIPITOR) 80 MG tablet Take 1 tablet (80 mg total) by mouth daily. 02/04/20   Ezekiel Slocumb, DO  Calcium Carbonate-Vitamin D3 (CALCIUM 600-D) 600-400 MG-UNIT TABS Take 1 tablet by mouth 2 (two) times daily.    [provider]  cetirizine (ZYRTEC) 10 MG tablet Take 10 mg by mouth daily.    [provider]  Cholecalciferol (VITAMIN D) 125 MCG (5000 UT) CAPS Take 5,000 Units by mouth daily.    [provider]  clopidogrel (PLAVIX) 75 MG tablet Take 1 tablet (75 mg total) by mouth daily. 02/04/20   Nicole Kindred A, DO  diclofenac sodium (VOLTAREN) 1 % GEL Apply 2 g topically 3 (three) times daily. 01/03/19   [provider]  diltiazem (CARDIZEM CD) 120 MG 24 hr capsule Take 1 capsule (120 mg total) by mouth daily. 02/04/20   Ezekiel Slocumb, DO  diphenhydrAMINE (BENADRYL) 25 MG tablet Take 25 mg by mouth every 6 (six) hours as needed for allergies.     [provider]  docusate sodium (COLACE) 100 MG capsule Take 1 capsule (100 mg total) by mouth 2 (two) times daily. 02/03/20   Nicole Kindred A, DO  escitalopram (LEXAPRO) 10 MG tablet Take 10 mg by mouth at bedtime. 06/08/16   [provider]  feeding supplement, ENSURE ENLIVE, (ENSURE ENLIVE) LIQD Take 237 mLs by mouth 3 (three) times daily between meals. 06/10/19   Fritzi Mandes, MD  ferrous sulfate 300 (60 Fe) MG/5ML syrup Take 300 mg by mouth in the morning and at bedtime.     [provider]  Fluticasone-Salmeterol (ADVAIR) 100-50 MCG/DOSE AEPB Inhale 1 puff into the lungs 2 (two) times daily.    [provider]  furosemide (LASIX) 20 MG tablet Take 20 mg by mouth daily.    [provider]  guaifenesin (ROBITUSSIN) 100 MG/5ML syrup Take 300 mg by mouth 4 (four) times daily as needed for cough.    [provider]  loperamide (IMODIUM A-D) 2 MG tablet Take 2 mg by mouth daily as needed for diarrhea or loose stools.    [provider]  magnesium hydroxide (MILK OF MAGNESIA) 400 MG/5ML suspension Take 30 mLs by mouth daily as needed for mild constipation.    [provider]  metoCLOPramide (REGLAN) 5 MG tablet Take 5 mg by mouth every 4 (four) hours as needed for nausea. If ondansetron does not work, give reglan.    [provider]  metoprolol tartrate (LOPRESSOR) 25  MG tablet Take 6.25 mg by mouth 2 (two) times daily.    [provider]  Multiple Vitamin (MULTIVITAMIN WITH MINERALS) TABS tablet Take 1 tablet by mouth daily. 06/11/19   Fritzi Mandes, MD  nitroGLYCERIN (NITROSTAT) 0.4 MG SL tablet Place 1 tablet (0.4 mg total) under the tongue every 5 (five) minutes as needed for chest pain. 02/03/20   Ezekiel Slocumb, DO  nystatin ointment (MYCOSTATIN) Apply 1 application topically 2 (two) times daily. Apply to lower back    [provider]  omeprazole (PRILOSEC) 20 MG capsule Take 20 mg by mouth daily.    [provider]  ondansetron (ZOFRAN) 4 MG tablet Take 1 tablet (4 mg total) by mouth every 6 (six) hours as needed for nausea. Patient taking differently: Take 4 mg by mouth every 4 (four) hours as needed for nausea or vomiting. If zofran is ineffective, give reglan. 02/03/20   Nicole Kindred A, DO  OXYGEN Inhale 2 L/min into the lungs continuous.     [provider]  potassium chloride SA (KLOR-CON) 20 MEQ tablet Take 40 mEq by mouth daily.    [provider]  tiZANidine  (ZANAFLEX) 4 MG tablet Take 4 mg by mouth daily.    [provider]  traMADol (ULTRAM) 50 MG tablet Take 50 mg by mouth every 8 (eight) hours.    [provider]    Physical Exam: Vitals:   03/21/20 1715 03/21/20 1730 03/21/20 1800 03/21/20 1900  BP:  (!) 142/59 (!) 156/60 (!) 142/58  Pulse: 96 (!) 103 (!) 111 99  Resp: (!) 24 (!) 25 (!) 25 (!) 24  Temp:      TempSrc:      SpO2: (!) 89% 93% 90% 95%  Weight:      Height:         Vitals:   03/21/20 1715 03/21/20 1730 03/21/20 1800 03/21/20 1900  BP:  (!) 142/59 (!) 156/60 (!) 142/58  Pulse: 96 (!) 103 (!) 111 99  Resp: (!) 24 (!) 25 (!) 25 (!) 24  Temp:      TempSrc:      SpO2: (!) 89% 93% 90% 95%  Weight:      Height:          Constitutional: Alert.  Tachypneic with labored breathing  HEENT:      Head: Normocephalic and atraumatic.         Eyes: PERLA, EOMI, Conjunctivae are normal. Sclera is non-icteric.       Mouth/Throat: Mucous membranes are moist.       Neck: Supple with no signs of meningismus. Cardiovascular:  Tachycardic. No murmurs, gallops, or rubs. 2+ symmetrical distal pulses are present . No JVD. No LE edema Respiratory: Respiratory effort increased.Lungs sounds diminished bilaterally.  Coarse breath sounds bilateral bases right greater than left Gastrointestinal: Soft, non tender, and non distended with positive bowel sounds. No rebound or guarding. Genitourinary: No CVA tenderness. Musculoskeletal: Nontender with normal range of motion in all extremities. No cyanosis, or erythema of extremities. Neurologic:  Face is symmetric. Moving all extremities. No gross focal neurologic deficits . Skin:  Extensive areas of papular rash with erythematous base seen in the groin area and on the buttocks and posterior thigh with maceration and skin breaks oozing sanguineous fluid Psychiatric: Mood and affect are normal    Labs on Admission: I have personally reviewed following labs and imaging  studies  CBC: Recent Labs  Lab 03/21/20 1720  WBC 28.0*  HGB  10.0*  HCT 32.3*  MCV 89.5  PLT 347*   Basic Metabolic Panel: Recent Labs  Lab 03/21/20 1720  NA 133*  K 5.7*  CL 101  CO2 17*  GLUCOSE 104*  BUN 42*  CREATININE 1.26*  CALCIUM 8.2*   GFR: Estimated Creatinine Clearance: 28.5 mL/min (A) (by C-G formula based on SCr of 1.26 mg/dL (H)). Liver Function Tests: Recent Labs  Lab 03/21/20 1812  AST 35  ALT 37  ALKPHOS 185*  BILITOT 1.6*  PROT 6.8  ALBUMIN 3.1*   No results for input(s): LIPASE, AMYLASE in the last 168 hours. No results for input(s): AMMONIA in the last 168 hours. Coagulation Profile: Recent Labs  Lab 03/21/20 1812  INR 1.1   Cardiac Enzymes: No results for input(s): CKTOTAL, CKMB, CKMBINDEX, TROPONINI in the last 168 hours. BNP (last 3 results) No results for input(s): PROBNP in the last 8760 hours. HbA1C: No results for input(s): HGBA1C in the last 72 hours. CBG: No results for input(s): GLUCAP in the last 168 hours. Lipid Profile: No results for input(s): CHOL, HDL, LDLCALC, TRIG, CHOLHDL, LDLDIRECT in the last 72 hours. Thyroid Function Tests: No results for input(s): TSH, T4TOTAL, FREET4, T3FREE, THYROIDAB in the last 72 hours. Anemia Panel: No results for input(s): VITAMINB12, FOLATE, FERRITIN, TIBC, IRON, RETICCTPCT in the last 72 hours. Urine analysis:    Component Value Date/Time   COLORURINE YELLOW (A) 01/27/2020 1314   APPEARANCEUR HAZY (A) 01/27/2020 1314   LABSPEC 1.017 01/27/2020 1314   PHURINE 5.0 01/27/2020 1314   GLUCOSEU NEGATIVE 01/27/2020 1314   HGBUR LARGE (A) 01/27/2020 1314   BILIRUBINUR NEGATIVE 01/27/2020 1314   KETONESUR NEGATIVE 01/27/2020 1314   PROTEINUR 30 (A) 01/27/2020 1314   NITRITE NEGATIVE 01/27/2020 1314   LEUKOCYTESUR TRACE (A) 01/27/2020 1314    Radiological Exams on Admission: DG Chest 2 View  Result Date: 03/21/2020 CLINICAL DATA:  Shortness of breath. EXAM: CHEST - 2 VIEW  COMPARISON:  January 30, 2020 FINDINGS: Upper limits of normal cardiac silhouette. Calcific atherosclerotic disease and tortuosity of the aorta. Severe upper lobe predominant emphysematous changes of the lungs are chronic. Patchy airspace consolidation versus atelectasis in bilateral lower lobes. IMPRESSION: 1. Patchy airspace consolidation versus atelectasis in bilateral lower lobes. 2. 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 Electronically Signed   By: Fidela Salisbury M.D.   On: 03/21/2020 17:59     Assessment/Plan 79 year old female with history of COPD, chronic hypoxic respiratory failure on home O2 at 4 L/min, grade 1 diastolic dysfunction, last EF 60 to 65% September 2021, deaf mutism, chronic anemia, recently hospitalized in September for hip fracture during which she had NSTEMI, since followed with cardiologist, Dr. Ubaldo Glassing who thought elevated troponin more likely related to demand, and who was discharged to rehab and recently moved to long-term care facility, who was sent into the emergency room for evaluation because of bleeding wounds on the buttocks but meeting sepsis criteria on work-up.    Sepsis ([place]) CAP (community acquired pneumonia) COPD with chronic bronchitis ([place])  Chronic respiratory failure with hypoxia ([place]) -Sepsis criteria include tachycardia and tachypnea with leukocytosis of 28,000.  Infiltrate seen on chest x-ray -Chest x-ray showing patchy airspace consolidation versus atelectasis in bilateral lower lobes and severe upper lobe predominant emphysematous changes of the lungs -Patient is tachypneic but O2 requirement not increased -Received IV fluid bolus in the emergency room -Continue IV hydration -Continue Rocephin and azithromycin -DuoNebs as needed    AKI (acute kidney injury) (  [place]) Metabolic acidosis   Hyperkalemia -Suspecting prerenal, though history limited -Creatinine 1.26 above baseline of 0.57 with K+ 5.7 and bicarb of 17 -IV  hydration -Monitor renal function -Serial BMP deafness  Rash, maculopapular -Contact dermatitis related to moisture vs yeast, on buttock, posterior thigh -Barrier precautions -Nystatin cream to affected area -Wound care consult -Dermatology consult if available    Pressure injury of skin -Patient with oozing from pressure wounds on CT groin area -Daily dressings -Wound care    Chronic diastolic CHF (congestive heart failure) ([facility]) -BNP elevated, but no evidence of overload on chest x-ray -Monitor for overload in view of IV hydration in the treatment of sepsis -Hold home Lasix in view of AKI -Continue metoprolol -Daily weights  Essential hypertension -Continue diltiazem  History of NSTEMI September 2021 -Patient had elevations of troponin up to 4000 during hospitalization in September, was thought to be demand by cardiology -Seen by cardiology, Dr. Ubaldo Glassing on 10/5 -Has had no cath or stress test per my review of chart -Continue clopidogrel, aspirin, atorvastatin, metoprolol for now     Chronic anemia -Hemoglobin 10 which is up from recent baseline of 8.6 -Given reports of bleeding ulcers on buttocks we will continue to monitor  Deaf mutism -Increase nursing assistance for communication of needs   DVT prophylaxis: Lovenox  Code Status: DNR per legal guardian Jewel Munroe Family Communication:  Spoke with legal guardian jewelMunroeDisposition Plan: Back to previous home environment Consults called: none  Status:At the time of admission, it appears that the appropriate admission status for this patient is INPATIENT. This is judged to be reasonable and necessary in order to provide the required intensity of service to ensure the patient's safety given the presenting symptoms, physical exam findings, and initial radiographic and laboratory data in the context of their  Comorbid conditions.   Patient requires inpatient status due to high intensity of service, high risk for further  deterioration and high frequency of surveillance required.   I certify that at the point of admission it is my clinical judgment that the patient will require inpatient hospital care spanning beyond [place] MD [facility]     03/21/2020, 7:38 PM

## 2020-03-22 ENCOUNTER — Encounter: Payer: Self-pay | Admitting: Internal Medicine

## 2020-03-22 ENCOUNTER — Inpatient Hospital Stay: Payer: Medicare Other

## 2020-03-22 DIAGNOSIS — J9611 Chronic respiratory failure with hypoxia: Secondary | ICD-10-CM

## 2020-03-22 DIAGNOSIS — N179 Acute kidney failure, unspecified: Secondary | ICD-10-CM

## 2020-03-22 DIAGNOSIS — E875 Hyperkalemia: Secondary | ICD-10-CM

## 2020-03-22 DIAGNOSIS — D649 Anemia, unspecified: Secondary | ICD-10-CM

## 2020-03-22 DIAGNOSIS — E872 Acidosis: Secondary | ICD-10-CM

## 2020-03-22 DIAGNOSIS — H9193 Unspecified hearing loss, bilateral: Secondary | ICD-10-CM

## 2020-03-22 DIAGNOSIS — L89151 Pressure ulcer of sacral region, stage 1: Secondary | ICD-10-CM

## 2020-03-22 DIAGNOSIS — R21 Rash and other nonspecific skin eruption: Secondary | ICD-10-CM

## 2020-03-22 LAB — BASIC METABOLIC PANEL
Anion gap: 15 (ref 5–15)
BUN: 28 mg/dL — ABNORMAL HIGH (ref 8–23)
CO2: 17 mmol/L — ABNORMAL LOW (ref 22–32)
Calcium: 7.5 mg/dL — ABNORMAL LOW (ref 8.9–10.3)
Chloride: 103 mmol/L (ref 98–111)
Creatinine, Ser: 0.82 mg/dL (ref 0.44–1.00)
GFR, Estimated: >60 mL/min (ref >60)
Glucose, Bld: 87 mg/dL (ref 70–99)
Potassium: 4.5 mmol/L (ref 3.5–5.1)
Sodium: 135 mmol/L (ref 135–145)

## 2020-03-22 LAB — CBC
HCT: 27.9 % — ABNORMAL LOW (ref 36.0–46.0)
Hemoglobin: 8.5 g/dL — ABNORMAL LOW (ref 12.0–15.0)
MCH: 27.7 pg (ref 26.0–34.0)
MCHC: 30.5 g/dL (ref 30.0–36.0)
MCV: 90.9 fL (ref 80.0–100.0)
Platelets: 477 10*3/uL — ABNORMAL HIGH (ref 150–400)
RBC: 3.07 MIL/uL — ABNORMAL LOW (ref 3.87–5.11)
RDW: 16.8 % — ABNORMAL HIGH (ref 11.5–15.5)
WBC: 28.7 10*3/uL — ABNORMAL HIGH (ref 4.0–10.5)
nRBC: 0 % (ref 0.0–0.2)

## 2020-03-22 LAB — PROCALCITONIN: Procalcitonin: 0.3 ng/mL

## 2020-03-22 LAB — URINE CULTURE

## 2020-03-22 LAB — PROTIME-INR
INR: 1.1 (ref 0.8–1.2)
Prothrombin Time: 13.6 seconds (ref 11.4–15.2)

## 2020-03-22 LAB — TROPONIN I (HIGH SENSITIVITY): Troponin I (High Sensitivity): 86 ng/L — ABNORMAL HIGH (ref <18)

## 2020-03-22 LAB — CORTISOL-AM, BLOOD: Cortisol - AM: 54.3 ug/dL — ABNORMAL HIGH (ref 6.7–22.6)

## 2020-03-22 MED ORDER — METOPROLOL TARTRATE 25 MG PO TABS: 25.0000 mg | ORAL_TABLET | Freq: Two times a day (BID) | ORAL | Status: DC

## 2020-03-22 MED ORDER — PREDNISONE 50 MG PO TABS
60.0000 mg | ORAL_TABLET | Freq: Every day | ORAL | Status: DC
  Administered 2020-03-23 – 2020-03-24 (×2): 60 mg via ORAL
  Filled 2020-03-22 (×2): qty 1

## 2020-03-22 MED ORDER — NITROGLYCERIN 0.4 MG SL SUBL: 0.4000 mg | SUBLINGUAL_TABLET | SUBLINGUAL | Status: DC | PRN

## 2020-03-22 MED ORDER — METOPROLOL TARTRATE 5 MG/5ML IV SOLN
2.5000 mg | Freq: Once | INTRAVENOUS | Status: AC
  Administered 2020-03-22: 2.5 mg via INTRAVENOUS
  Filled 2020-03-22: qty 5

## 2020-03-22 MED ORDER — GUAIFENESIN ER 600 MG PO TB12
1200.0000 mg | ORAL_TABLET | Freq: Two times a day (BID) | ORAL | Status: DC
  Administered 2020-03-23 – 2020-03-25 (×5): 1200 mg via ORAL
  Filled 2020-03-22 (×6): qty 2

## 2020-03-22 MED ORDER — METOPROLOL TARTRATE 25 MG PO TABS
12.5000 mg | ORAL_TABLET | Freq: Two times a day (BID) | ORAL | Status: DC
  Administered 2020-03-23 – 2020-03-25 (×5): 12.5 mg via ORAL
  Filled 2020-03-22 (×6): qty 1

## 2020-03-22 MED ORDER — ENOXAPARIN SODIUM 40 MG/0.4ML ~~LOC~~ SOLN
40.0000 mg | SUBCUTANEOUS | Status: DC
  Administered 2020-03-23 – 2020-03-24 (×2): 40 mg via SUBCUTANEOUS
  Filled 2020-03-22 (×3): qty 0.4

## 2020-03-22 MED ORDER — DILTIAZEM HCL ER COATED BEADS 120 MG PO CP24
120.0000 mg | ORAL_CAPSULE | Freq: Every day | ORAL | Status: DC
  Administered 2020-03-22 – 2020-03-25 (×4): 120 mg via ORAL
  Filled 2020-03-22 (×4): qty 1

## 2020-03-22 MED ORDER — IPRATROPIUM-ALBUTEROL 0.5-2.5 (3) MG/3ML IN SOLN
3.0000 mL | Freq: Four times a day (QID) | RESPIRATORY_TRACT | Status: DC | PRN
  Administered 2020-03-23: 3 mL via RESPIRATORY_TRACT

## 2020-03-22 MED ORDER — FUROSEMIDE 10 MG/ML IJ SOLN
40.0000 mg | Freq: Once | INTRAMUSCULAR | Status: AC
  Administered 2020-03-22: 40 mg via INTRAVENOUS
  Filled 2020-03-22: qty 4

## 2020-03-22 MED ORDER — MORPHINE SULFATE (PF) 2 MG/ML IV SOLN
2.0000 mg | Freq: Once | INTRAVENOUS | Status: AC
  Administered 2020-03-22: 2 mg via INTRAVENOUS
  Filled 2020-03-22: qty 1

## 2020-03-22 NOTE — ED Notes (Signed)
Patient refusing to leave on NRB mask. Pt educated that oxygen is too low and patient still refusing. O2 88% on 6L nasal cannula and patient tachypneic at 35. NP Ouma made aware.

## 2020-03-22 NOTE — Progress Notes (Signed)
Pt's heart rate increased from 110s to 190s as confirmed by the telemetry. Dr Fran Lowes made aware and EKG was ordered.

## 2020-03-22 NOTE — ED Notes (Signed)
Patient repositioned to left side.

## 2020-03-22 NOTE — ED Notes (Signed)
Partial linen change completed, peri-care completed. Purewick replaced. Patient repositioned to back.

## 2020-03-22 NOTE — ED Notes (Signed)
Complete linen change, peri-care, sacral wound dressings changed, purewick replaced.

## 2020-03-22 NOTE — ED Notes (Signed)
Per Ouma NP, patients oxygen needs to be titrated down to 5L. O2 nasal cannula titrated down to 5L, O2 93%.

## 2020-03-22 NOTE — ED Notes (Signed)
Pt had dark bowel movement, hemoccult positive. [facility]uma NP made aware.

## 2020-03-22 NOTE — Progress Notes (Signed)
PROGRESS NOTE  Alice Wallace RKY:[id number] DOB: [birth year] DOA: 03/21/2020 PCP: Juluis Pitch, MD  Brief History    Alice Wallace is a 79 y.o. female with medical history significant for COPD, chronic hypoxic respiratory failure on home O2 at 4 L/min, grade 1 diastolic dysfunction, last EF 60 to 65% September 2021, deaf mutism, chronic anemia, recently hospitalized in September for hip fracture during which she had NSTEMI versus demand ischemia per cardiology, who was sent into the emergency room for evaluation because of bleeding wounds on the buttocks. History is limited due to the difficulty with communication as patient does not know sign language and communicates through reading written questions and nodding or shaking her head. On arrival she was afebrile but was tachycardic at 99 and tachypneic at 26 with O2 sat 90% on O2 at her home flow rate of 4.  Blood work revealed WBC of 28,000 with lactic acid 1.1.   hemoglobin of 10, baseline around 8.  BMP revealed creatinine of 1.26 with hyperkalemia of 5.7 and metabolic acidosis with bicarb of 17.  Troponin 40, BNP 383.  She had slight elevation in liver enzymes with alk phos 185 and total bili 1.6.  Transaminases normal.  Chest x-ray showed patchy airspace consolidation versus atelectasis in bilateral lower lobes. EKG as interpreted by me: Sinus tachycardia at 101 with nonspecific ST-T wave changes.  Patient started on sepsis fluid bolus for treatment of sepsis secondary to pneumonia as well as IV antibiotics.  Hospitalist consulted for admission.   This afternoon the patient has had episodes of altered mental status and elevated heart rate due to atrial fibrillation. Stat CT head has been ordered. The patient's prior to admission medications included low dose metoprolol and Cardizem CD. These have been restarted.   Consultants  . None  Procedures  . None  Antibiotics   Anti-infectives (From admission, onward)   Start     Dose/Rate Route  Frequency Ordered Stop   03/22/20 1800  cefTRIAXone (ROCEPHIN) 2 g in sodium chloride 0.9 % 100 mL IVPB        2 g 200 mL/hr over 30 Minutes Intravenous Every 24 hours 03/21/20 1932     03/22/20 1800  azithromycin (ZITHROMAX) 500 mg in sodium chloride 0.9 % 250 mL IVPB        500 mg 250 mL/hr over 60 Minutes Intravenous Every 24 hours 03/21/20 1932     03/21/20 1815  cefTRIAXone (ROCEPHIN) 2 g in sodium chloride 0.9 % 100 mL IVPB  Status:  Discontinued        2 g 200 mL/hr over 30 Minutes Intravenous Every 24 hours 03/21/20 1812 03/21/20 1943   03/21/20 1815  azithromycin (ZITHROMAX) 500 mg in sodium chloride 0.9 % 250 mL IVPB  Status:  Discontinued        500 mg 250 mL/hr over 60 Minutes Intravenous Every 24 hours 03/21/20 1812 03/21/20 1944    .   Subjective  The patient is sleeping upon entering the room. I communicate with her by writing on paper. She indicates that she has no needs and no new complaints.   Objective   Vitals:  Vitals:   03/22/20 1405 03/22/20 1621  BP: (!) 129/52 (!) 152/74  Pulse: 89 (!) 101  Resp: (!) 24 20  Temp: 97.7 F (36.5 C) 97.9 F (36.6 C)  SpO2: 97% 95%   Exam:  Constitutional:  . The patient is awake and alert. No acute distress. Respiratory:  . No increased work of breathing. Marland Kitchen  No wheezes, rales, or rhonchi . No tactile fremitus Cardiovascular:  . Regular rate and rhythm . No murmurs, ectopy, or gallups. . No lateral PMI. No thrills. Abdomen:  . Abdomen is soft, non-tender, non-distended . No hernias, masses, or organomegaly . Normoactive bowel sounds.  Musculoskeletal:  . No cyanosis, clubbing, or edema Skin:  . No rashes, lesions, ulcers . palpation of skin: no induration or nodules Neurologic:  . CN 2-12 intact . Sensation all 4 extremities intact Psychiatric:  . Mental status o Mood, affect appropriate o Orientation to person, place, time  . judgment and insight appear intact  I have personally reviewed the  following:   Today's Data  . Vitals, BMP, CBC, Troponin  Imaging  . CT head demonstrated no acute intracranial abnormality, moderate chronic small vessel disease. There is also right maxillary sinus fluid possibly representing sinusitis.  Cardiology Data  . EKG: pending  Scheduled Meds: . diltiazem  120 mg Oral Daily  . enoxaparin (LOVENOX) injection  40 mg Subcutaneous Q24H  . guaiFENesin  1,200 mg Oral BID  . metoprolol tartrate  12.5 mg Oral BID   Continuous Infusions: . sodium chloride 100 mL/hr at 03/22/20 0602  . azithromycin    . cefTRIAXone (ROCEPHIN)  IV      Principal Problem:   Sepsis ([place]) Active Problems:   AKI (acute kidney injury) ([place])   Deafness   Pressure injury of skin   Chronic diastolic CHF (congestive heart failure) ([facility])   Chronic respiratory failure with hypoxia ([facility])   Metabolic acidosis   Hyperkalemia   Chronic anemia   COPD with chronic bronchitis ([facility])   CAP (community acquired pneumonia)   Acute maculopapular rash   LOS: 1 day   A & P   Severe Sepsis ([place]) with tachycardia, tachypnea, hypoxia, leukocytosis, and AKI due to CAP (community acquired pneumonia) and UTI. Lactic acid 1.2.  Acute on chronic hypoxic respiratory failure: The patient is currently requiring 5L O2 to maintain saturations of 95%. She usually is on 4L O2 at home. Due to pneumonia and COPD exacerbation. Wean O2 as possible to maintain saturations greater than 92%.   Pneumonia: The patient is receiving IV Rocephin and azithromycin to treat community acquired pneumonia. Will add mucinex and flutter valve.  Acute exacerbation of COPD: Continue nebulizer treatments and steroid taper.  Atrial fibrillation: Nursing reports jumps in heart rate to the 140's and once to the 190's. The patient has missed doses of cardizem CD and metoprolol as she takes them at home. Stat EKG ordered, but not yet done. I have restarted her Cardizem and metoprolol  UTI: UA indicative of UTI. Urine  culture is pending.  AKI: Due to sepsis. Baseline creatinine is 0.88. Creatinine on admission was increased by 50% at 1.26. Improved to 1.07 with IV fluids.  Hyperkalemia: Potassium was 5.7 on admission due to AKI. Decreased to 5.1 this morning after IV fluids.  Rash, maculopapular: Contact dermatitis related to moisture vs yeast, on buttock, posterior thigh. This will be addressed with barrier precautions, nystatin cream to affected area. Wound care consult.  Pressure injury of skin: Patient with oozing from pressure wounds on CT groin area.  Chronic diastolic CHF (congestive heart failure) [facility]): Patient appears euvolemia. Monitor volume status. Home lasix has been held due to sepsis and AKI. Strict I's and O's.  Essential hypertension: The patient has been restarted on her home dose of Cardizem CD and metoprolol.   History of NSTEMI September 2021: Patient had elevations of troponin  up to 4000 during hospitalization in September, was thought to be demand by cardiology. She has been seen by cardiology, Dr. Ubaldo Glassing on 02/24/20. She has had had no cath or stress test per my review of chart. Troponins on this admissions have been mildly elevated, but are relatively flat. Continue clopidogrel, aspirin, atorvastatin, metoprolol.  Atrial fibrillation: Continue metoprolol and cardizem CD. Monitor on telemetry. Awaiting EKG.    Chronic anemia: Hemoglobin 10 which is up from recent baseline of 8.6. Monitor.   Deaf mutism: patient communicates best through the written word.   I have seen and examined this patient myself. I have spent 42 minutes in her evaluation and care.  DVT prophylaxis: Lovenox  Code Status: DNR  Family Communication:  None available. Disposition:  Status is: Inpatient  Remains inpatient appropriate because:IV treatments appropriate due to intensity of illness or inability to take PO   Dispo: The patient is from: Group home              Anticipated d/c is to: Group  home              Anticipated d/c date is: 3 days              Patient currently is not medically stable to d/c. Alice Wellnitz, DO [facility] Direct contact: see [email or web address]  7PM-7AM contact night coverage as above 03/22/2020, 5:10 PM  LOS: 1 day

## 2020-03-22 NOTE — Evaluation (Signed)
Physical Therapy Evaluation Patient Details Name: Alice Wallace MRN: [id number] DOB: 10/15/1940 Today's Date: 03/22/2020   History of Present Illness  Alice Wallace is a 79 y/o female who was admitted for bleeding wounds on buttocks. PMH includes COPD, chronic hypoxic respiratory failure on 4L O2 at home, grade I diastolic dysfunction, last EF 83-33% Sept. 2021, deaf mutism, HTN, osteoporosis, chronic anemaia, recent hospitalization in Sept. for hip fracture during which she had NSTEMI vs demand ischemia per cardiology, and R hip IM nailing.  Clinical Impression  Pt lying in bed on 5L O2 via nasal cannula watching TV. Clinician communicated via writing on paper for the pt to read. Pt noted with labored gargled breathing throughout session. Vitals monitored throughout session and WNL with SpO2 in mid to upper 90s and HR around low 100s. Pt shook head that she was agreeable to participate and strength testing began. Pt with decreased ROM noted on bilat, shoulders as pt only able to reach 2/3 arc of motion with increased effort to achieve. Pt with decent UE strength bilaterally grossly 3+ to 4/5. Pt began to moan/groan and began pointing to her abdomen. Pt shook her head yes to having a BM. HOB lowered slightly in order for pt to roll for pericare and pt rolled to R with CGA, requiring demonstration and tactile cues. Pt's stool noted to be black and purewick was soiled. Pt returned to semifowlers position with supervision only and clinician proceeded to clean pericare area. Pt with difficulty abducting at hips bilaterally and unsure if pt guarding or is limited in ROM. Clinician called for assistance to perform edge of bed sitting however once assistance arrived, pt noted to no longer be reading messages on paper. Pt with change in status as she began an upward gaze toward the ceiling and presented with decreased responsiveness to tactile and visual stimulation. Nurse tech arrived and made aware and then RN  notified. Both RN and nurse tech assessed the pt and the RN reported that she will notify the doctor as this is a change in status/alertness. Further mobility deferred due to change in status. Anticipate pt will require assistance for bed mobility, transfers, and ambulation. Pt with deficits in strength, functional mobility, functional activity tolerance, and anticipated balance deficits. Recommend skilled PT to address current deficits and STR at discharge to optimize return to PLOF and maximize functional mobility, safety and independence.    Follow Up Recommendations SNF    Equipment Recommendations  Other (comment) (TBD pending progress)    Recommendations for Other Services       Precautions / Restrictions Precautions Precautions: Fall Restrictions Weight Bearing Restrictions: No      Mobility  Bed Mobility Overal bed mobility: Needs Assistance Bed Mobility: Rolling Rolling: Min guard         General bed mobility comments: CGA with heavy tactile cues and demonstration for rolling to R side and for return to semifowler's position; further mobility deferred due to change in status    Transfers                 General transfer comment: further mobility deferred due to change in status  Ambulation/Gait             General Gait Details: not attempted  Stairs            Wheelchair Mobility    Modified Rankin (Stroke Patients Only)       Balance Overall balance assessment: History of Falls  Pertinent Vitals/Pain Pain Assessment: Faces Faces Pain Scale: Hurts even more Pain Location: abdomen; pt moaning/groaning while having BM Pain Descriptors / Indicators: Discomfort;Sore Pain Intervention(s): Monitored during session;Repositioned    Home Living Family/patient expects to be discharged to:: Assisted living               Home Equipment: Walker - 4 wheels;Bedside commode       Prior Function Level of Independence: Needs assistance   Gait / Transfers Assistance Needed: Mod Ind amb in ALF with a rollator, 2 falls in the last 6-9 months     Comments: 4L home O2     Hand Dominance        Extremity/Trunk Assessment   Upper Extremity Assessment Upper Extremity Assessment: Generalized weakness (decreased sh. flex bilat to 2/3 arc; grossly 3+ to 4/5 bilat)    Lower Extremity Assessment Lower Extremity Assessment: Generalized weakness (grossly 3- to 4-/5 bilaterally)       Communication   Communication: Other (comment);Deaf (mute)  Cognition Arousal/Alertness: Awake/alert Behavior During Therapy: WFL for tasks assessed/performed;Flat affect Overall Cognitive Status: Difficult to assess                                 General Comments: Pt initially alert and attentive to clinician then pt underwent change in status with upward gaze, decreased responsiveness to tactile/visual stimulation. Nsg notified and arrived.      General Comments General comments (skin integrity, edema, etc.): pt with foam patches noted to be all over bottom; red blotchiness areas noted in periarea    Exercises     Assessment/Plan    PT Assessment Patient needs continued PT services  PT Problem List Decreased strength;Decreased range of motion;Decreased activity tolerance;Decreased balance;Decreased mobility;Cardiopulmonary status limiting activity;Decreased skin integrity;Pain       PT Treatment Interventions DME instruction;Gait training;Functional mobility training;Therapeutic activities;Therapeutic exercise;Balance training;Patient/family education    PT Goals (Current goals can be found in the Care Plan section)  Acute Rehab PT Goals Patient Stated Goal: none stated PT Goal Formulation: Patient unable to participate in goal setting Time For Goal Achievement: 04/05/20 Potential to Achieve Goals: Fair    Frequency Min 2X/week   Barriers to discharge         Co-evaluation               AM-PAC PT "6 Clicks" Mobility  Outcome Measure Help needed turning from your back to your side while in a flat bed without using bedrails?: A Little Help needed moving from lying on your back to sitting on the side of a flat bed without using bedrails?: A Lot Help needed moving to and from a bed to a chair (including a wheelchair)?: A Lot Help needed standing up from a chair using your arms (e.g., wheelchair or bedside chair)?: A Lot Help needed to walk in hospital room?: A Lot Help needed climbing 3-5 steps with a railing? : A Lot 6 Click Score: 13    End of Session Equipment Utilized During Treatment: Oxygen;Other (comment) (5L) Activity Tolerance: Other (comment) (pt limited due to change in status) Patient left: in bed;with call bell/phone within reach;with nursing/sitter in room Nurse Communication: Other (comment) (change in status during eval) PT Visit Diagnosis: Unsteadiness on feet (R26.81);Repeated falls (R29.6);Muscle weakness (generalized) (M62.81);History of falling (Z91.81);Pain Pain - Right/Left:  (centralized) Pain - part of body:  (abdomen)    Time: 4098-1191 PT Time Calculation (min) (ACUTE ONLY):  43 min   Charges:              Frederich Chick, SPT  Frederich Chick 03/22/2020, 4:15 PM

## 2020-03-22 NOTE — ED Notes (Signed)
Para March MD advising orders to follow.

## 2020-03-22 NOTE — ED Notes (Signed)
Patient still refusing to wear NRB O2 saturation 84-88% on 6L nasal cannula, tachypneic at 36 and restless, NP Ouma made aware and advised to continue with 6L nasal cannula.

## 2020-03-22 NOTE — ED Notes (Addendum)
Patient newly tachycardic in 130s, O2 saturation noted to be 88% on 6L, pt tachypneic at 35, pt is restless. Pt placed on NRB at 10L with oxygenation improvement to 94%. MD Para March paged to make aware.

## 2020-03-23 ENCOUNTER — Inpatient Hospital Stay: Payer: Medicare Other

## 2020-03-23 LAB — BASIC METABOLIC PANEL
Anion gap: 16 — ABNORMAL HIGH (ref 5–15)
Anion gap: 13 (ref 5–15)
BUN: 13 mg/dL (ref 8–23)
BUN: 10 mg/dL (ref 8–23)
CO2: 19 mmol/L — ABNORMAL LOW (ref 22–32)
CO2: 22 mmol/L (ref 22–32)
Calcium: 8.2 mg/dL — ABNORMAL LOW (ref 8.9–10.3)
Calcium: 8 mg/dL — ABNORMAL LOW (ref 8.9–10.3)
Chloride: 103 mmol/L (ref 98–111)
Chloride: 100 mmol/L (ref 98–111)
Creatinine, Ser: 0.59 mg/dL (ref 0.44–1.00)
Creatinine, Ser: 0.47 mg/dL (ref 0.44–1.00)
GFR, Estimated: >60 mL/min (ref >60)
GFR, Estimated: >60 mL/min (ref >60)
Glucose, Bld: 82 mg/dL (ref 70–99)
Glucose, Bld: 128 mg/dL — ABNORMAL HIGH (ref 70–99)
Potassium: 2.9 mmol/L — ABNORMAL LOW (ref 3.5–5.1)
Potassium: 3.1 mmol/L — ABNORMAL LOW (ref 3.5–5.1)
Sodium: 138 mmol/L (ref 135–145)
Sodium: 135 mmol/L (ref 135–145)

## 2020-03-23 LAB — CBC WITH DIFFERENTIAL/PLATELET
Abs Immature Granulocytes: 0.23 10*3/uL — ABNORMAL HIGH (ref 0.00–0.07)
Basophils Absolute: 0 10*3/uL (ref 0.0–0.1)
Basophils Relative: 0 %
Eosinophils Absolute: 0 10*3/uL (ref 0.0–0.5)
Eosinophils Relative: 0 %
HCT: 26.6 % — ABNORMAL LOW (ref 36.0–46.0)
Hemoglobin: 8.4 g/dL — ABNORMAL LOW (ref 12.0–15.0)
Immature Granulocytes: 1 %
Lymphocytes Relative: 6 %
Lymphs Abs: 0.9 10*3/uL (ref 0.7–4.0)
MCH: 27.8 pg (ref 26.0–34.0)
MCHC: 31.6 g/dL (ref 30.0–36.0)
MCV: 88.1 fL (ref 80.0–100.0)
Monocytes Absolute: 0.7 10*3/uL (ref 0.1–1.0)
Monocytes Relative: 4 %
Neutro Abs: 14.1 10*3/uL — ABNORMAL HIGH (ref 1.7–7.7)
Neutrophils Relative %: 89 %
Platelets: 386 10*3/uL (ref 150–400)
RBC: 3.02 MIL/uL — ABNORMAL LOW (ref 3.87–5.11)
RDW: 17.1 % — ABNORMAL HIGH (ref 11.5–15.5)
WBC: 16 10*3/uL — ABNORMAL HIGH (ref 4.0–10.5)
nRBC: 0 % (ref 0.0–0.2)

## 2020-03-23 LAB — BLOOD GAS, ARTERIAL
Acid-base deficit: 1.4 mmol/L (ref 0.0–2.0)
Bicarbonate: 22.2 mmol/L (ref 20.0–28.0)
FIO2: 0.4
O2 Saturation: 65.4 %
Patient temperature: 37
pCO2 arterial: 32 mmHg (ref 32.0–48.0)
pH, Arterial: 7.45 (ref 7.350–7.450)
pO2, Arterial: 32 mmHg — CL (ref 83.0–108.0)

## 2020-03-23 LAB — GLUCOSE, CAPILLARY: Glucose-Capillary: 88 mg/dL (ref 70–99)

## 2020-03-23 MED ORDER — ORAL CARE MOUTH RINSE
15.0000 mL | Freq: Two times a day (BID) | OROMUCOSAL | Status: DC
  Administered 2020-03-23 – 2020-03-25 (×4): 15 mL via OROMUCOSAL

## 2020-03-23 MED ORDER — POTASSIUM CHLORIDE 10 MEQ/100ML IV SOLN
10.0000 meq | INTRAVENOUS | Status: AC
  Administered 2020-03-23 (×4): 10 meq via INTRAVENOUS
  Filled 2020-03-23 (×4): qty 100

## 2020-03-23 MED ORDER — GERHARDT'S BUTT CREAM
TOPICAL_CREAM | Freq: Two times a day (BID) | CUTANEOUS | Status: DC
  Administered 2020-03-25: 1 via TOPICAL
  Filled 2020-03-23: qty 1

## 2020-03-23 NOTE — Progress Notes (Signed)
Upon assessment, pt very lethargic, responds to painful stimuli, acute change in status from earlier assessment. VSS, Dr.Swayze made aware via secure chat. New orders for chest xray and blood gas.  Will continue to monitor.

## 2020-03-23 NOTE — Plan of Care (Signed)

## 2020-03-23 NOTE — Consult Note (Signed)
WOC Nurse Consult Note: Reason for Consult:Moisture Associated skin damage to buttocks, perineal skin and posterior thighs.  Patient is incontinent with limited communication and skin with partial thickness tissue loss.  Wound type:MASD Pressure Injury POA: NA Measurement: scattered nonintact tissue loss.  Wound SWF:UXNA and moist and weeping. Drainage (amount, consistency, odor) minimal weeping and tender to touch Periwound:intact Dressing procedure/placement/frequency: Cleanse skin breakdown soap and water and pat dry. Apply GErhardts butt paste twice daily and PRN soilage.  No disposable briefs or underpads while in bed.  Will not follow at this time.  Please re-consult if needed.  Maple Hudson MSN, RN, FNP-BC CWON Wound, Ostomy, Continence Nurse Pager [phone]

## 2020-03-23 NOTE — Progress Notes (Signed)
PROGRESS NOTE  Charlen Bakula HYI:[id number] DOB: 07/19/1940 DOA: 03/21/2020 PCP: Juluis Pitch, MD  Brief History    Chelsye Suhre is a 79 y.o. female with medical history significant for COPD, chronic hypoxic respiratory failure on home O2 at 4 L/min, grade 1 diastolic dysfunction, last EF 60 to 65% September 2021, deaf mutism, chronic anemia, recently hospitalized in September for hip fracture during which she had NSTEMI versus demand ischemia per cardiology, who was sent into the emergency room for evaluation because of bleeding wounds on the buttocks. History is limited due to the difficulty with communication as patient does not know sign language and communicates through reading written questions and nodding or shaking her head. On arrival she was afebrile but was tachycardic at 99 and tachypneic at 26 with O2 sat 90% on O2 at her home flow rate of 4.  Blood work revealed WBC of 28,000 with lactic acid 1.1.   hemoglobin of 10, baseline around 8.  BMP revealed creatinine of 1.26 with hyperkalemia of 5.7 and metabolic acidosis with bicarb of 17.  Troponin 40, BNP 383.  She had slight elevation in liver enzymes with alk phos 185 and total bili 1.6.  Transaminases normal.  Chest x-ray showed patchy airspace consolidation versus atelectasis in bilateral lower lobes. EKG as interpreted by me: Sinus tachycardia at 101 with nonspecific ST-T wave changes.  Patient started on sepsis fluid bolus for treatment of sepsis secondary to pneumonia as well as IV antibiotics.  Hospitalist consulted for admission.   This afternoon the patient has had episodes of altered mental status and elevated heart rate due to atrial fibrillation. Stat CT head has been ordered. The patient's prior to admission medications included low dose metoprolol and Cardizem CD. These have been restarted.   Pt with an episode of lethargy. FSBS was checked and was 88. ABG was obtained and demonstrated hypoxia. Continuous pulse oximetry was  checked and was 95%. CXR demonstrated severe bullous COPD and persistent bibasilar pulmonary infiltrates. Will also order EEG. Mental status improved later.  Consultants  . None  Procedures  . None  Antibiotics   Anti-infectives (From admission, onward)   Start     Dose/Rate Route Frequency Ordered Stop   03/22/20 1800  cefTRIAXone (ROCEPHIN) 2 g in sodium chloride 0.9 % 100 mL IVPB        2 g 200 mL/hr over 30 Minutes Intravenous Every 24 hours 03/21/20 1932     03/22/20 1800  azithromycin (ZITHROMAX) 500 mg in sodium chloride 0.9 % 250 mL IVPB        500 mg 250 mL/hr over 60 Minutes Intravenous Every 24 hours 03/21/20 1932     03/21/20 1815  cefTRIAXone (ROCEPHIN) 2 g in sodium chloride 0.9 % 100 mL IVPB  Status:  Discontinued        2 g 200 mL/hr over 30 Minutes Intravenous Every 24 hours 03/21/20 1812 03/21/20 1943   03/21/20 1815  azithromycin (ZITHROMAX) 500 mg in sodium chloride 0.9 % 250 mL IVPB  Status:  Discontinued        500 mg 250 mL/hr over 60 Minutes Intravenous Every 24 hours 03/21/20 1812 03/21/20 1944      Subjective  The patient is lethargic upon entering the room. She responded slowly to stimulation, but definitely changed from yesterday.  Objective   Vitals:  Vitals:   03/23/20 1232 03/23/20 1633  BP: 136/65 127/66  Pulse: 83 75  Resp:    Temp: 98.4 F (36.9 C) 98.3 F (  36.8 C)  SpO2:  99%   Exam:  Constitutional:  . The patient is awake but lethargic. No acute distress. Respiratory:  . No increased work of breathing. . No wheezes, rales, or rhonchi . No tactile fremitus Cardiovascular:  . Regular rate and rhythm . No murmurs, ectopy, or gallups. . No lateral PMI. No thrills. Abdomen:  . Abdomen is soft, non-tender, non-distended . No hernias, masses, or organomegaly . Normoactive bowel sounds.  Musculoskeletal:  . No cyanosis, clubbing, or edema Skin:  . No rashes, lesions, ulcers . palpation of skin: no induration or  nodules Neurologic:  . CN 2-12 intact . Sensation all 4 extremities intact Psychiatric:  . Mental status o Mood, affect appropriate o Orientation to person, place, time  . judgment and insight appear intact  I have personally reviewed the following:   Today's Data  . Vitals, BMP, CBC, Troponin  Imaging  . CT head demonstrated no acute intracranial abnormality, moderate chronic small vessel disease. There is also right maxillary sinus fluid possibly representing sinusitis. . 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 .   Cardiology Data  . EKG: pending  Scheduled Meds: . diltiazem  120 mg Oral Daily  . enoxaparin (LOVENOX) injection  40 mg Subcutaneous Q24H  . Gerhardt's butt cream   Topical BID  . guaiFENesin  1,200 mg Oral BID  . metoprolol tartrate  12.5 mg Oral BID  . predniSONE  60 mg Oral Q breakfast   Continuous Infusions: . sodium chloride Stopped (03/22/20 2004)  . azithromycin 500 mg (03/22/20 1758)  . cefTRIAXone (ROCEPHIN)  IV Stopped (03/22/20 1756)    Principal Problem:   Sepsis ([place]) Active Problems:   AKI (acute kidney injury) ([place])   Deafness   Pressure injury of skin   Chronic diastolic CHF (congestive heart failure) (HCC)   Chronic respiratory failure with hypoxia (HCC)   Metabolic acidosis   Hyperkalemia   Chronic anemia   COPD with chronic bronchitis (HCC)   CAP (community acquired pneumonia)   Acute maculopapular rash   LOS: 2 days   A & P   Severe Sepsis ([place]) with tachycardia, tachypnea, hypoxia, leukocytosis, and AKI due to CAP (community acquired pneumonia) and UTI. Lactic acid 1.2.  Acute on chronic hypoxic respiratory failure: The patient is currently requiring 5L O2 to maintain saturations of 95%. She usually is on 4L O2 at home. Due to pneumonia and COPD exacerbation. Wean O2 as possible to maintain saturations greater than 92%. Now saturating in the mid to high 90's on 5 L.   Pneumonia: The patient is  receiving IV Rocephin and azithromycin to treat community acquired pneumonia. Will add mucinex and flutter valve.  Altered mental status: Possibly due to intermittent hypoxia, that resolved quickly vs partial complex seizure. Ct head performed yesterday demonstrated no acute changes. Will check an EEG.  Acute exacerbation of COPD: Continue nebulizer treatments and steroid taper.  Atrial fibrillation: Nursing reports jumps in heart rate to the 140's and once to the 190's. The patient has missed doses of cardizem CD and metoprolol as she takes them at home. Stat EKG ordered, but not yet done. I have restarted her Cardizem and metoprolol  UTI: UA indicative of UTI. Urine culture is pending.  AKI: Due to sepsis. Baseline creatinine is 0.88. Creatinine on admission was increased by 50% at 1.26. Improved to 0.47 with IV fluids.  Hyperkalemia: Potassium was 5.7 on admission due to AKI. Decreased to 5.1 this morning after IV fluids.  Rash,  maculopapular: Contact dermatitis related to moisture vs yeast, on buttock, posterior thigh. This will be addressed with barrier precautions, nystatin cream to affected area. Wound care consult.  Pressure injury of skin: Patient with oozing from pressure wounds on CT groin area.  Chronic diastolic CHF (congestive heart failure) [facility]): Patient appears euvolemia. Monitor volume status. Home lasix has been held due to sepsis and AKI. Strict I's and O's.  Essential hypertension: The patient has been restarted on her home dose of Cardizem CD and metoprolol.   History of NSTEMI September 2021: Patient had elevations of troponin up to 4000 during hospitalization in September, was thought to be demand by cardiology. She has been seen by cardiology, Dr. Ubaldo Glassing on 02/24/20. She has had had no cath or stress test per my review of chart. Troponins on this admissions have been mildly elevated, but are relatively flat. Continue clopidogrel, aspirin, atorvastatin,  metoprolol.  Atrial fibrillation: Continue metoprolol and cardizem CD. Monitor on telemetry. Awaiting EKG.    Chronic anemia: Hemoglobin 8.4 today. His baseline appears to be 8.6. Monitor.   Deaf mutism: patient communicates best through the written word.   I have seen and examined this patient myself. I have spent 45 minutes in her evaluation and care.  DVT prophylaxis: Lovenox  Code Status: DNR  Family Communication:  None available. Disposition:  Status is: Inpatient  Remains inpatient appropriate because:IV treatments appropriate due to intensity of illness or inability to take PO   Dispo: The patient is from: Group home              Anticipated d/c is to: Group home              Anticipated d/c date is: 3 days              Patient currently is not medically stable to d/c. Anay Walter, DO [facility] Direct contact: see [email or web address]  7PM-7AM contact night coverage as above 03/23/2020, 5:56 PM  LOS: 1 day

## 2020-03-23 NOTE — Progress Notes (Signed)
Mobility Specialist - Progress Note   03/23/20 1317  Mobility  Activity Refused mobility  Mobility performed by Mobility specialist    Communicated w/ pt via writing on paper. Pt initially agreed to bed-level exercises. After completing 5 reps of slr, pt expressed pain through face expressions and a slight moan. Mobility specialist asked pt if she was having any pain, pt nodded yes. Mobility specialist asked of location of pain and pt nodded no. Pt requested to stop session and refused further mobility. Will re-attempt session another date. Nurse was notified.    Milania Haubner Mobility Specialist  03/23/20, 1:23 PM

## 2020-03-24 DIAGNOSIS — R4182 Altered mental status, unspecified: Secondary | ICD-10-CM

## 2020-03-24 DIAGNOSIS — J189 Pneumonia, unspecified organism: Secondary | ICD-10-CM

## 2020-03-24 DIAGNOSIS — J449 Chronic obstructive pulmonary disease, unspecified: Secondary | ICD-10-CM

## 2020-03-24 LAB — BASIC METABOLIC PANEL
Anion gap: 11 (ref 5–15)
BUN: 9 mg/dL (ref 8–23)
CO2: 24 mmol/L (ref 22–32)
Calcium: 8.3 mg/dL — ABNORMAL LOW (ref 8.9–10.3)
Chloride: 100 mmol/L (ref 98–111)
Creatinine, Ser: 0.54 mg/dL (ref 0.44–1.00)
GFR, Estimated: >60 mL/min (ref >60)
Glucose, Bld: 94 mg/dL (ref 70–99)
Potassium: 3.8 mmol/L (ref 3.5–5.1)
Sodium: 135 mmol/L (ref 135–145)

## 2020-03-24 LAB — CBC WITH DIFFERENTIAL/PLATELET
Abs Immature Granulocytes: 0.16 10*3/uL — ABNORMAL HIGH (ref 0.00–0.07)
Basophils Absolute: 0 10*3/uL (ref 0.0–0.1)
Basophils Relative: 0 %
Eosinophils Absolute: 0 10*3/uL (ref 0.0–0.5)
Eosinophils Relative: 0 %
HCT: 24.4 % — ABNORMAL LOW (ref 36.0–46.0)
Hemoglobin: 7.7 g/dL — ABNORMAL LOW (ref 12.0–15.0)
Immature Granulocytes: 1 %
Lymphocytes Relative: 9 %
Lymphs Abs: 1.2 10*3/uL (ref 0.7–4.0)
MCH: 27.3 pg (ref 26.0–34.0)
MCHC: 31.6 g/dL (ref 30.0–36.0)
MCV: 86.5 fL (ref 80.0–100.0)
Monocytes Absolute: 0.5 10*3/uL (ref 0.1–1.0)
Monocytes Relative: 4 %
Neutro Abs: 12.2 10*3/uL — ABNORMAL HIGH (ref 1.7–7.7)
Neutrophils Relative %: 86 %
Platelets: 371 10*3/uL (ref 150–400)
RBC: 2.82 MIL/uL — ABNORMAL LOW (ref 3.87–5.11)
RDW: 16.9 % — ABNORMAL HIGH (ref 11.5–15.5)
WBC: 14.1 10*3/uL — ABNORMAL HIGH (ref 4.0–10.5)
nRBC: 0.4 % — ABNORMAL HIGH (ref 0.0–0.2)

## 2020-03-24 MED ORDER — FUROSEMIDE 10 MG/ML IJ SOLN
40.0000 mg | Freq: Every day | INTRAMUSCULAR | Status: DC
  Administered 2020-03-24 – 2020-03-25 (×2): 40 mg via INTRAVENOUS
  Filled 2020-03-24: qty 4

## 2020-03-24 MED ORDER — METHYLPREDNISOLONE SODIUM SUCC 40 MG IJ SOLR
40.0000 mg | Freq: Two times a day (BID) | INTRAMUSCULAR | Status: DC
  Administered 2020-03-24 – 2020-03-25 (×3): 40 mg via INTRAVENOUS
  Filled 2020-03-24 (×2): qty 1

## 2020-03-24 MED ORDER — IPRATROPIUM-ALBUTEROL 0.5-2.5 (3) MG/3ML IN SOLN
3.0000 mL | Freq: Four times a day (QID) | RESPIRATORY_TRACT | Status: DC
  Administered 2020-03-24 – 2020-03-25 (×4): 3 mL via RESPIRATORY_TRACT
  Filled 2020-03-24 (×3): qty 3

## 2020-03-24 NOTE — Progress Notes (Signed)
Physical Therapy Treatment Patient Details Name: Alice Wallace MRN: [id number] DOB: Nov 05, 1940 Today's Date: 03/24/2020    History of Present Illness Kathleena Gindlesperger is a 79 y/o female who was admitted for bleeding wounds on buttocks. PMH includes COPD, chronic hypoxic respiratory failure on 4L O2 at home, grade I diastolic dysfunction, last EF 52-84% Sept. 2021, deaf mutism, HTN, osteoporosis, chronic anemaia, recent hospitalization in Sept. for hip fracture during which she had NSTEMI vs demand ischemia per cardiology, and R hip IM nailing.    PT Comments    Pt seen this pm. Sitting up in bedside recliner upon arrival. Pt on %L O2 via [place] with resting sats at 94%. Pt completed several sit to stand transfers with MinA.  Static standing with support of RW and CGA while receiving assistance for hygiene.  Two minute seated rest break prior to pt completing gait training with RW in room with MinA for safety and directional guidance due to impaired hearing and innability for pt to let needs known.  Pt returned to sitting with O2 sats at 83%.  Verbal cues given to pt to focus on PLB. Pt returned to 94% after 2 minutes. MinA to return to bed and position in upright seated posture to promote airway clearance. Overall great tolerance for mobility today, pt remains motivated to improve.  Follow Up Recommendations  SNF     Equipment Recommendations       Recommendations for Other Services       Precautions / Restrictions Precautions Precautions: Fall Restrictions Weight Bearing Restrictions: No    Mobility  Bed Mobility Overal bed mobility: Needs Assistance Bed Mobility: Rolling Rolling: Min guard         General bed mobility comments: max visual cues for desired activity  Transfers Overall transfer level: Needs assistance Equipment used: Rolling walker (2 wheeled) Transfers: Sit to/from Stand Sit to Stand: Min assist         General transfer comment: MinA to raise from low  chairl.  Ambulation/Gait Ambulation/Gait assistance: Min Chemical engineer (Feet): 25 Feet Assistive device: Rolling walker (2 wheeled) Gait Pattern/deviations: Shuffle (decreased dorsiflexion R foot)     General Gait Details: 5L O2 via [place]   Stairs             Wheelchair Mobility    Modified Rankin (Stroke Patients Only)       Balance                                            Cognition Arousal/Alertness: Awake/alert Behavior During Therapy: WFL for tasks assessed/performed;Flat affect Overall Cognitive Status: Difficult to assess                                 General Comments: Pt motivated to progress mobility      Exercises      General Comments General comments (skin integrity, edema, etc.): Pt on 5L O2, 94% O2 sats, HR 74bpm, after ambulation, O2 sats 83%, HR 84bpm.       Pertinent Vitals/Pain Pain Assessment: No/denies pain Faces Pain Scale: No hurt    Home Living                      Prior Function            PT  Goals (current goals can now be found in the care plan section) Acute Rehab PT Goals Patient Stated Goal: wants bilateral UE IV's out    Frequency    Min 2X/week      PT Plan Current plan remains appropriate    Co-evaluation              AM-PAC PT "6 Clicks" Mobility   Outcome Measure  Help needed turning from your back to your side while in a flat bed without using bedrails?: A Little Help needed moving from lying on your back to sitting on the side of a flat bed without using bedrails?: A Little Help needed moving to and from a bed to a chair (including a wheelchair)?: A Little Help needed standing up from a chair using your arms (e.g., wheelchair or bedside chair)?: A Little Help needed to walk in hospital room?: A Lot Help needed climbing 3-5 steps with a railing? : A Lot 6 Click Score: 16    End of Session Equipment Utilized During Treatment: Oxygen;Gait  belt Activity Tolerance: Patient tolerated treatment well Patient left: in bed;with bed alarm set;with call bell/phone within reach Nurse Communication:  (Pt requesting removal of both IV lines in UE's) PT Visit Diagnosis: Unsteadiness on feet (R26.81);Repeated falls (R29.6);Muscle weakness (generalized) (M62.81);History of falling (Z91.81);Pain     Time: 1450-1535 PT Time Calculation (min) (ACUTE ONLY): 45 min  Charges:                        Zadie Cleverly, PTA   Jannet Askew 03/24/2020, 3:47 PM

## 2020-03-24 NOTE — NC FL2 (Signed)
[place] MEDICAID FL2 LEVEL OF CARE SCREENING TOOL     IDENTIFICATION  Patient Name: Alice Wallace Birthdate: 07/26/1940 Sex: female Admission Date (Current Location): 03/21/2020  [place] and [place] Number:  Chiropodist and Address:  [facility], [address], [place], [place] [address]      Provider Number: [id number]  Attending Physician Name and Address:  Charise Killian, MD  Relative Name and Phone Number:       Current Level of Care: Hospital Recommended Level of Care: Skilled Nursing Facility Prior Approval Number:    Date Approved/Denied:   PASRR Number: [id number]  Discharge Plan: SNF    Current Diagnoses: Patient Active Problem List   Diagnosis Date Noted  . Acute maculopapular rash 03/22/2020  . Gastroesophageal reflux disease with esophagitis 03/21/2020  . Pressure injury of left buttock, stage 2 (HCC) 03/21/2020  . Chronic respiratory failure with hypoxia (HCC) 03/21/2020  . Metabolic acidosis 03/21/2020  . Hyperkalemia 03/21/2020  . Chronic anemia 03/21/2020  . 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  . CAP (community acquired pneumonia) 03/21/2020  . Sepsis (HCC) 03/21/2020  . Malnutrition of moderate degree 02/03/2020  . Chronic diastolic CHF (congestive heart failure) (HCC) 02/03/2020  . Pressure injury of skin 02/01/2020  . Nondisplaced fracture of base of neck of right femur, initial encounter for closed fracture (HCC)   . Goals of care, counseling/discussion   . Palliative care by specialist   . Hip fracture, unspecified laterality, closed, initial encounter (HCC) 01/26/2020  . Chronic obstructive pulmonary disease with acute exacerbation (HCC) 01/21/2020  . Non-STEMI (non-ST elevated myocardial infarction) (HCC) 01/21/2020  . Acute respiratory failure with hypoxia and hypercapnia (HCC)   . Pneumonia due to COVID-19 virus 06/07/2019  . Hypokalemia 06/07/2019  . AKI (acute kidney injury) (HCC)  06/07/2019  . Deafness 06/07/2019  . COVID-19 virus infection 05/27/2019  . Essential hypertension 11/29/2018    Orientation RESPIRATION BLADDER Height & Weight     Place, Self  O2 ([place] 6L) External catheter, Incontinent (placed 10/31) Weight: 110 lb 1.6 oz (49.9 kg) Height:  5\' 5"  (165.1 cm)  BEHAVIORAL SYMPTOMS/MOOD NEUROLOGICAL BOWEL NUTRITION STATUS      Incontinent Diet (heart healthy, thin liquids)  AMBULATORY STATUS COMMUNICATION OF NEEDS Skin   Extensive Assist Verbally PU Stage and Appropriate Care, Skin abrasions (unstageable wound on right heel, foam dressing. open wound on buttocks, foam dressing, change daily)                       Personal Care Assistance Level of Assistance  Bathing, Feeding, Dressing Bathing Assistance: Maximum assistance Feeding assistance: Independent Dressing Assistance: Maximum assistance     Functional Limitations Info  Sight, Hearing, Speech Sight Info: Adequate Hearing Info: Adequate Speech Info: Adequate    SPECIAL CARE FACTORS FREQUENCY  PT (By licensed PT), OT (By licensed OT)     PT Frequency: 5x OT Frequency: 5x            Contractures Contractures Info: Not present    Additional Factors Info  Code Status, Allergies Code Status Info: DNR Allergies Info: Hydrocodone, Naproxen, Other, Sulfamethoxazole-trimethoprim, Tramadol           Current Medications (03/24/2020):  This is the current hospital active medication list Current Facility-Administered Medications  Medication Dose Route Frequency Provider Last Rate Last Admin  . acetaminophen (TYLENOL) tablet 650 mg  650 mg Oral Q6H PRN 13/07/2019, MD  Or  . acetaminophen (TYLENOL) suppository 650 mg  650 mg Rectal Q6H PRN Andris Baumann, MD      . azithromycin (ZITHROMAX) 500 mg in sodium chloride 0.9 % 250 mL IVPB  500 mg Intravenous Q24H Lindajo Royal V, MD 250 mL/hr at 03/23/20 1801 500 mg at 03/23/20 1801  . cefTRIAXone (ROCEPHIN) 2 g in sodium  chloride 0.9 % 100 mL IVPB  2 g Intravenous Q24H Lindajo Royal V, MD 200 mL/hr at 03/23/20 1807 2 g at 03/23/20 1807  . diltiazem (CARDIZEM CD) 24 hr capsule 120 mg  120 mg Oral Daily Swayze, Ava, DO   120 mg at 03/24/20 1023  . enoxaparin (LOVENOX) injection 40 mg  40 mg Subcutaneous Q24H Ronnald Ramp, RPH   40 mg at 03/23/20 2134  . furosemide (LASIX) injection 40 mg  40 mg Intravenous Daily Charise Killian, MD   40 mg at 03/24/20 1121  . Gerhardt's butt cream   Topical BID Swayze, Ava, DO   Given at 03/24/20 1023  . guaiFENesin (MUCINEX) 12 hr tablet 1,200 mg  1,200 mg Oral BID Swayze, Ava, DO   1,200 mg at 03/24/20 1023  . HYDROcodone-acetaminophen (NORCO/VICODIN) 5-325 MG per tablet 1-2 tablet  1-2 tablet Oral Q4H PRN Andris Baumann, MD   1 tablet at 03/21/20 2101  . ipratropium-albuterol (DUONEB) 0.5-2.5 (3) MG/3ML nebulizer solution 3 mL  3 mL Nebulization Q6H Charise Killian, MD      . MEDLINE mouth rinse  15 mL Mouth Rinse BID Swayze, Ava, DO   15 mL at 03/24/20 1023  . methylPREDNISolone sodium succinate (SOLU-MEDROL) 40 mg/mL injection 40 mg  40 mg Intravenous BID Charise Killian, MD   40 mg at 03/24/20 1120  . metoprolol tartrate (LOPRESSOR) tablet 12.5 mg  12.5 mg Oral BID Swayze, Ava, DO   12.5 mg at 03/24/20 1023  . nitroGLYCERIN (NITROSTAT) SL tablet 0.4 mg  0.4 mg Sublingual Q5 min PRN Andris Baumann, MD      . ondansetron [facility]) tablet 4 mg  4 mg Oral Q6H PRN Andris Baumann, MD       Or  . ondansetron [employer] [facility]) injection 4 mg  4 mg Intravenous Q6H PRN Andris Baumann, MD      . senna-docusate (Senokot-S) tablet 1 tablet  1 tablet Oral QHS PRN Andris Baumann, MD         Discharge Medications: Please see discharge summary for a list of discharge medications.  Relevant Imaging Results:  Relevant Lab Results:   Additional Information SSN:[id number]  Reuel Boom [place], LCSW

## 2020-03-24 NOTE — Care Management Important Message (Signed)
Important Message  Patient Details  Name: Alice Wallace MRN: [id number] Date of Birth: [birth year]   Medicare Important Message Given:  Yes  Reviewed with legal guardian, [place].  Declined copy of Medicare IM as one being sent to her attention via mail by [employer].    [name] 03/24/2020, 11:12 AM

## 2020-03-24 NOTE — Plan of Care (Signed)

## 2020-03-24 NOTE — Progress Notes (Addendum)
[facility] Room 237 Civil engineer, contracting [facility]) Hospital Liaison RN note:  This patient is currently followed by out patient based palliative care with [employer]. We will follow for dispositon. Bridget Cobb, TOC is aware.  Thank you.  Cyndra Numbers, RN [facility] Liaison [phone]

## 2020-03-24 NOTE — TOC Initial Note (Signed)
Transition of Care [facility]) - Initial/Assessment Note    Patient Details  Name: Alice Wallace MRN: [id number] Date of Birth: 11/14/1940  Transition [facility] Care [facility]) CM/SW Contact:    Maree Krabbe, LCSW Phone Number: 03/24/2020, 11:36 AM  Clinical Narrative:  Pt is only alert to self and place. CSW spoke with pt's legal guardian, Jewel. Pt lives at [facility]. Jewel states pt was at [facility] and was d/c home the Thursday before she was admitted to the hospital. Jewel states pt was at [facility] for about a month. Pt has Medicare but also has Medicaid. Jewel agreeable to pt going back to [facility] prior to return to [facility].  CSW will send referral.  [facility] provides transportation for pt. The pt has a established PCP that rounds on her at [facility]. Pt gets her medications from the pharmacy in house at [facility]. CSW is working on arranged SNF placement for pt prior to return to [facility].            Expected Discharge Plan: Skilled Nursing Facility Barriers to Discharge: Continued Medical Work up   Patient Goals and CMS Choice Patient states their goals for this hospitalization and ongoing recovery are:: to get pt better   Choice offered to / list presented to : [facility] POA / Guardian  Expected Discharge Plan and Services Expected Discharge Plan: Skilled Nursing Facility In-house Referral: NA   Post Acute Care Choice: Skilled Nursing Facility Living arrangements for the past 2 months: Assisted Living Facility                                      Prior Living Arrangements/Services Living arrangements for the past 2 months: Assisted Living Facility Lives with:: Facility Resident Patient language and need for interpreter reviewed:: Yes Do you feel safe going back to the place where you live?: Yes      Need for Family Participation in Patient Care: Yes (Comment) Care giver support system in place?: Yes (comment)   Criminal Activity/Legal Involvement Pertinent to Current Situation/Hospitalization:  No - Comment as needed  Activities of Daily Living      Permission Sought/Granted Permission sought to share information with : Guardian    Share Information with NAME: Jewel  Permission granted to share info w AGENCY: [facility] [employer] granted to share info w Relationship: Legal Guardian     Emotional Assessment Appearance:: Appears stated age Attitude/Demeanor/Rapport: Unable to Assess Affect (typically observed): Unable to Assess Orientation: : Oriented to Self, Oriented to Place Alcohol / Substance Use: Not Applicable Psych Involvement: No (comment)  Admission diagnosis:  SOB (shortness of breath) [R06.02] Sepsis (HCC) [A41.9] Community acquired pneumonia, unspecified laterality [J18.9] Pressure injury of skin of sacral region, unspecified injury stage [L89.159] Sepsis with acute hypoxic respiratory failure without septic shock, due to unspecified organism (HCC) [A41.9, R65.20, J96.01] Patient Active Problem List   Diagnosis Date Noted  . Acute maculopapular rash 03/22/2020  . Gastroesophageal reflux disease with esophagitis 03/21/2020  . Pressure injury of left buttock, stage 2 (HCC) 03/21/2020  . Chronic respiratory failure with hypoxia (HCC) 03/21/2020  . Metabolic acidosis 03/21/2020  . Hyperkalemia 03/21/2020  . Chronic anemia 03/21/2020  . 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  . CAP (community acquired pneumonia) 03/21/2020  . Sepsis (HCC) 03/21/2020  . Malnutrition of moderate degree 02/03/2020  . Chronic diastolic CHF (congestive heart failure) (HCC)  02/03/2020  . Pressure injury of skin 02/01/2020  . Nondisplaced fracture of base of neck of right femur, initial encounter for closed fracture (HCC)   . Goals of care, counseling/discussion   . Palliative care by specialist   . Hip fracture, unspecified laterality, closed, initial encounter (HCC) 01/26/2020  . Chronic obstructive pulmonary disease with acute exacerbation (HCC)  01/21/2020  . Non-STEMI (non-ST elevated myocardial infarction) (HCC) 01/21/2020  . Acute respiratory failure with hypoxia and hypercapnia (HCC)   . Pneumonia due to COVID-19 virus 06/07/2019  . Hypokalemia 06/07/2019  . AKI (acute kidney injury) (HCC) 06/07/2019  . Deafness 06/07/2019  . COVID-19 virus infection 05/27/2019  . Essential hypertension 11/29/2018   PCP:  Dorothey Baseman, MD Pharmacy:  No Pharmacies Listed    Social Determinants of Health (SDOH) Interventions    Readmission Risk Interventions Readmission Risk Prevention Plan 03/24/2020  Transportation Screening Complete  PCP or Specialist Appt within 3-5 Days Complete  HRI or Home Care Consult Complete  Social Work Consult for Recovery Care Planning/Counseling Complete  Palliative Care Screening Not Applicable  Medication Review [facility]) Complete

## 2020-03-24 NOTE — Procedures (Signed)
Patient Name: Alice Wallace  MRN: [id number]  Epilepsy Attending: Charlsie Quest  Referring Physician/Provider: Dr Fran Lowes Date: 03/24/2020 Duration: 22.49 mins  Patient history: 79yo F with ams. EEG to evaluate for seizure  Level of alertness:  lethargic  AEDs during EEG study: None  Technical aspects: This EEG study was done with scalp electrodes positioned according to the 10-20 International system of electrode placement. Electrical activity was acquired at a sampling rate of 500Hz  and reviewed with a high frequency filter of 70Hz  and a low frequency filter of 1Hz . EEG data were recorded continuously and digitally stored.   Description: No clear posterior dominant rhythm was seen. EEG showed continuous generalized 2 to 5 Hz theta-delta slowing. Physiologic photic driving was not seen during photic stimulation.  Hyperventilation was not performed.     ABNORMALITY -Continuous slow, generalized  IMPRESSION: This study is suggestive of moderate diffuse encephalopathy, nonspecific etiology. No seizures or epileptiform discharges were seen throughout the recording.  Alice Wallace 

## 2020-03-24 NOTE — Progress Notes (Signed)
Mobility Specialist - Progress Note   03/24/20 1334  Mobility  Activity  (seated exercises)  Level of Assistance Contact guard assist, steadying assist  Mobility Response Tolerated well  Mobility performed by Mobility specialist  $Mobility charge 1 Mobility    Pt sitting on recliner upon arrival. Communicated w/ pt via writing on paper. Pt agreed to session. Pt performed seated exercises: ankle pumps x 10, kicks x 10, marches x 10. CGA utilized for steadying and control. Pt did not respond when asked if she had any pain. However, she tolerated session very well. Pt left sitting on recliner w/ all needs placed in reach. Nurse was notified.    Dugan Vanhoesen Mobility Specialist  03/24/20, 1:36 PM

## 2020-03-24 NOTE — Progress Notes (Signed)
eeg done °

## 2020-03-24 NOTE — Progress Notes (Addendum)
PROGRESS NOTE    Tala Eber  UUV:[id number] DOB: 1940-10-03 DOA: 03/21/2020 PCP: Dorothey Baseman, MD   Assessment & Plan:   Principal Problem:   Sepsis [facility]) Active Problems:   AKI (acute kidney injury) (HCC)   Deafness   Pressure injury of skin   Chronic diastolic CHF (congestive heart failure) (HCC)   Chronic respiratory failure with hypoxia (HCC)   Metabolic acidosis   Hyperkalemia   Chronic anemia   COPD with chronic bronchitis (HCC)   CAP (community acquired pneumonia)   Acute maculopapular rash   Severe sepsis: meet criteria with tachycardia, tachypnea, hypoxia, leukocytosis, and AKI due to CAP and UTI.   Acute on chronic hypoxic respiratory failure: currently requiring 5L O2 to maintain saturations of 95%. Chronically on 4LO2 at home. Secondary to pneumonia and COPD exacerbation.   Pneumonia: continue on IV rocephin, azithromycin. Continue on bronchodilators and encourage incentive spirometry & flutter valve   Altered mental status: etiology unclear, possibly secondary to intermittent hypoxia, that resolved quickly vs partial complex seizure. CT head shows no acute intracranial findings. EEG showed moderate diffuse encephalopathy, no seizure or epileptiform discharges  Acute exacerbation of COPD: continue on IV azithromycin, bronchodilators & started on IV steroids. Encourage incentive spirometry   Likely PAF: continue on home dose of cardizem, metoprolol.   UTI: continue on IV rocephin. Urine cx is growing multiple species. Repeat urine cx unlikely to grow anything as pt has been on IV abxs   AKI: baseline Cr 0.88. Resolved   Hyperkalemia: resolved  Rash, maculopapular: possibly contact dermatitisrelated to moisturevs yeast, on buttock, posterior thigh. Continue w/ barrier precautions, nystatin cream. Continue w/ wound care  Pressure injury of skin: continue w/ supportive & wound care   Chronic diastolic CHF: started on IV lasix. Monitor I/Os.     HTN: continue on metoprolol, cardizem.   Hx of NSTEMI: in September 2021.Continue clopidogrel, aspirin, atorvastatin, metoprolol. Troponins are mildly elevated but significantly less than what it was in 01/2020, likely demand ishchemia   Chronic anemia: H&H are trending down today slightly. No need for a transfusion at this time. 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    DVT prophylaxis: lovenox  Code Status: DNR  Family Communication:  Disposition Plan: likely SNF   Status is: Inpatient  Remains inpatient appropriate because:Ongoing diagnostic testing needed not appropriate for outpatient work up, Unsafe d/c plan and IV treatments appropriate due to intensity of illness or inability to take PO   Dispo: The patient is from: Home              Anticipated d/c is to: SNF              Anticipated d/c date is: 3 days              Patient currently is not medically stable to d/c.     Consultants:     Procedures:    Antimicrobials: rocephin, azithromycin    Subjective: Pt c/o abd pain  Objective: Vitals:   03/23/20 1633 03/23/20 1944 03/24/20 0407 03/24/20 0415  BP: 127/66 (!) 165/74 (!) 129/55   Pulse: 75 81 78   Resp:  (!) 22 16   Temp: 98.3 F (36.8 C) 97.7 F (36.5 C) 97.9 F (36.6 C)   TempSrc: Axillary Oral Oral   SpO2: 99% 97% 98%   Weight:    49.9 kg  Height:        Intake/Output Summary (Last 24  hours) at 03/24/2020 0740 Last data filed at 03/23/2020 1940 Gross per 24 hour  Intake 1956.85 ml  Output 0 ml  Net 1956.85 ml   Filed Weights   03/21/20 1701 03/23/20 0443 03/24/20 0415  Weight: 49.9 kg 51.2 kg 49.9 kg    Examination:  General exam: Appears calm but uncomfortable  Respiratory system: Course breath sounds b/l. Rales & wheezes b/l  Cardiovascular system: S1 & S2 +. No rubs, gallops or clicks.  Gastrointestinal system: Abdomen is nondistended, soft and nontender.  Normal bowel sounds  heard. Central nervous system: Alert and awake. Moves all extremities  Psychiatry: Judgement and insight appear abnormal. Flat mood and affect     Data Reviewed: I have personally reviewed following labs and imaging studies  CBC: Recent Labs  Lab 03/21/20 1720 03/22/20 0336 03/23/20 0414 03/24/20 0420  WBC 28.0* 28.7* 16.0* 14.1*  NEUTROABS  --   --  14.1* 12.2*  HGB 10.0* 8.5* 8.4* 7.7*  HCT 32.3* 27.9* 26.6* 24.4*  MCV 89.5 90.9 88.1 86.5  PLT 510* 477* 386 371   Basic Metabolic Panel: Recent Labs  Lab 03/21/20 1909 03/22/20 0336 03/23/20 0414 03/23/20 1400 03/24/20 0420  NA 133* 135 138 135 135  K 5.1 4.5 2.9* 3.1* 3.8  CL 100 103 103 100 100  CO2 18* 17* 19* 22 24  GLUCOSE 89 87 82 128* 94  BUN 37* 28* 13 10 9   CREATININE 1.07* 0.82 0.59 0.47 0.54  CALCIUM 7.5* 7.5* 8.2* 8.0* 8.3*   GFR: Estimated Creatinine Clearance: 44.9 mL/min (by C-G formula based on SCr of 0.54 mg/dL). Liver Function Tests: Recent Labs  Lab 03/21/20 1812  AST 35  ALT 37  ALKPHOS 185*  BILITOT 1.6*  PROT 6.8  ALBUMIN 3.1*   No results for input(s): LIPASE, AMYLASE in the last 168 hours. No results for input(s): AMMONIA in the last 168 hours. Coagulation Profile: Recent Labs  Lab 03/21/20 1812 03/22/20 0336  INR 1.1 1.1   Cardiac Enzymes: No results for input(s): CKTOTAL, CKMB, CKMBINDEX, TROPONINI in the last 168 hours. BNP (last 3 results) No results for input(s): PROBNP in the last 8760 hours. HbA1C: No results for input(s): HGBA1C in the last 72 hours. CBG: Recent Labs  Lab 03/23/20 1001  GLUCAP 88   Lipid Profile: No results for input(s): CHOL, HDL, LDLCALC, TRIG, CHOLHDL, LDLDIRECT in the last 72 hours. Thyroid Function Tests: No results for input(s): TSH, T4TOTAL, FREET4, T3FREE, THYROIDAB in the last 72 hours. Anemia Panel: No results for input(s): VITAMINB12, FOLATE, FERRITIN, TIBC, IRON, RETICCTPCT in the last 72 hours. Sepsis Labs: Recent Labs  Lab  03/21/20 1812 03/21/20 1916 03/22/20 0336  PROCALCITON  --   --  0.30  LATICACIDVEN 1.1 1.2  --     Recent Results (from the past 240 hour(s))  Blood Culture (routine x 2)     Status: None (Preliminary result)   Collection Time: 03/21/20  6:12 PM   Specimen: BLOOD  Result Value Ref Range Status   Specimen Description BLOOD LEFT ANTECUBITAL  Final   Special Requests   Final    BOTTLES DRAWN AEROBIC AND ANAEROBIC Blood Culture adequate volume   Culture   Final    NO GROWTH 3 DAYS Performed at [facility], [address] [place], [place]    Report Status PENDING  Incomplete  Blood Culture (routine x 2)     Status: None (Preliminary result)   Collection Time: 03/21/20  6:17 PM  Specimen: BLOOD  Result Value Ref Range Status   Specimen Description BLOOD BLOOD RIGHT ARM  Final   Special Requests   Final    BOTTLES DRAWN AEROBIC AND ANAEROBIC Blood Culture results may not be optimal due to an excessive volume of blood received in culture bottles   Culture  Setup Time IN BOTH AEROBIC AND ANAEROBIC BOTTLES  Final   Culture   Final    NO GROWTH 3 DAYS Performed at [facility], [address] [place], [place] [address]    Report Status PENDING  Incomplete  Respiratory Panel by RT PCR (Flu A&B, Covid) - Nasopharyngeal Swab     Status: None   Collection Time: 03/21/20  6:26 PM   Specimen: Nasopharyngeal Swab  Result Value Ref Range Status   SARS Coronavirus 2 by RT PCR NEGATIVE NEGATIVE Final    Comment: (NOTE) SARS-CoV-2 target nucleic acids are NOT DETECTED.  The SARS-CoV-2 RNA is generally detectable in upper respiratoy specimens during the acute phase of infection. The lowest concentration of SARS-CoV-2 viral copies this assay can detect is 131 copies/mL. A negative result does not preclude SARS-Cov-2 infection and should not be used as the sole basis for treatment or other patient management decisions. A negative result may occur with    improper specimen collection/handling, submission of specimen other than nasopharyngeal swab, presence of viral mutation(s) within the areas targeted by this assay, and inadequate number of viral copies (<131 copies/mL). A negative result must be combined with clinical observations, patient history, and epidemiological information. The expected result is Negative.  Fact Sheet for Patients:  https://www.moore.com/  Fact Sheet for Healthcare Providers:  https://www.young.biz/  This test is no t yet approved or cleared by the [place] FDA and  has been authorized for detection and/or diagnosis of SARS-CoV-2 by FDA under an Emergency Use Authorization (EUA). This EUA will remain  in effect (meaning this test can be used) for the duration of the COVID-19 declaration under Section 564(b)(1) of the Act, 21 U.S.C. section 360bbb-3(b)(1), unless the authorization is terminated or revoked sooner.     Influenza A by PCR NEGATIVE NEGATIVE Final   Influenza B by PCR NEGATIVE NEGATIVE Final    Comment: (NOTE) The Xpert Xpress SARS-CoV-2/FLU/RSV assay is intended as an aid in  the diagnosis of influenza from Nasopharyngeal swab specimens and  should not be used as a sole basis for treatment. Nasal washings and  aspirates are unacceptable for Xpert Xpress SARS-CoV-2/FLU/RSV  testing.  Fact Sheet for Patients: https://www.moore.com/  Fact Sheet for Healthcare Providers: https://www.young.biz/  This test is not yet approved or cleared by the [place] FDA and  has been authorized for detection and/or diagnosis of SARS-CoV-2 by  FDA under an Emergency Use Authorization (EUA). This EUA will remain  in effect (meaning this test can be used) for the duration of the  Covid-19 declaration under Section 564(b)(1) of the Act, 21  U.S.C. section 360bbb-3(b)(1), unless the authorization is  terminated or  revoked. Performed at [facility], [address], [place], [place] [address]   Urine culture     Status: Abnormal   Collection Time: 03/21/20 11:34 PM   Specimen: Urine, Random  Result Value Ref Range Status   Specimen Description   Final    URINE, RANDOM Performed at [facility], [address], [place], [place] [address]    Special Requests   Final    NONE Performed at [facility], [address]  [place], [place] [address]    Culture MULTIPLE SPECIES PRESENT, SUGGEST RECOLLECTION (A)  Final   Report Status 03/22/2020 FINAL  Final         Radiology Studies: DG Chest 1 View  Result Date: 03/23/2020 CLINICAL DATA:  Shortness of breath EXAM: CHEST  1 VIEW COMPARISON:  03/22/2020 FINDINGS: Hyperinflation of the lungs. Bilateral lower lobe airspace opacities are again noted, unchanged. Heart is normal size. Old bilateral rib fractures. IMPRESSION: COPD/chronic changes. Bilateral lower lobe airspace opacities, stable. Cannot exclude pneumonia. Electronically Signed   By: Charlett NoseKevin  Dover M.D.   On: 03/23/2020 02:54   CT HEAD WO CONTRAST  Result Date: 03/22/2020 CLINICAL DATA:  Mental status change. EXAM: CT HEAD WITHOUT CONTRAST TECHNIQUE: Contiguous axial images were obtained from the base of the skull through the vertex without intravenous contrast. COMPARISON:  01/17/2019 FINDINGS: Brain: There is no evidence of an acute infarct, intracranial hemorrhage, mass, midline shift, or extra-axial fluid collection. The ventricles and sulci are within normal limits for age. Patchy hypodensities in the cerebral white matter bilaterally are unchanged and nonspecific but compatible with moderate chronic small vessel ischemic disease. Vascular: Calcified atherosclerosis at the skull base. No hyperdense vessel. Skull: No acute fracture or suspicious osseous lesion. Sinuses/Orbits: Moderate volume fluid in the right maxillary sinus. No significant mastoid fluid. Bilateral  cataract extraction. Other: None. IMPRESSION: 1. No evidence of acute intracranial abnormality. 2. Moderate chronic small vessel ischemic disease. 3. Right maxillary sinus fluid, correlate for acute sinusitis. Electronically Signed   By: Sebastian AcheAllen  Grady M.D.   On: 03/22/2020 14:55   DG Chest Port 1 View  Result Date: 03/23/2020 CLINICAL DATA:  Shortness of breath. EXAM: PORTABLE CHEST 1 VIEW COMPARISON:  03/23/2020.  05/28/2019.  CT 01/21/2020. FINDINGS: Mediastinum hilar structures stable. Heart size stable. Persistent bibasilar pulmonary infiltrates. Severe bullous changes consistent with COPD. Surgical sutures noted over the left lung. No prominent pleural effusion. No pneumothorax. Degenerative change thoracic spine. Old bilateral rib fractures and postsurgical changes of the 6 rib again noted. IMPRESSION: Persistent bibasilar pulmonary infiltrates. No interim change. Severe bullous COPD. Postsurgical changes left lung. Electronically Signed   By: Maisie Fushomas  Register   On: 03/23/2020 10:29        Scheduled Meds: . diltiazem  120 mg Oral Daily  . enoxaparin (LOVENOX) injection  40 mg Subcutaneous Q24H  . Gerhardt's butt cream   Topical BID  . guaiFENesin  1,200 mg Oral BID  . mouth rinse  15 mL Mouth Rinse BID  . metoprolol tartrate  12.5 mg Oral BID  . predniSONE  60 mg Oral Q breakfast   Continuous Infusions: . azithromycin 500 mg (03/23/20 1801)  . cefTRIAXone (ROCEPHIN)  IV 2 g (03/23/20 1807)     LOS: 3 days    Time spent: 33 mins     Charise KillianJamiese M Nikea Settle, MD [facility] Pager [phone]  If 7PM-7AM, please contact night-coverage [email or web address] 03/24/2020, 7:40 AM

## 2020-03-25 DIAGNOSIS — I5032 Chronic diastolic (congestive) heart failure: Secondary | ICD-10-CM

## 2020-03-25 LAB — BASIC METABOLIC PANEL
Anion gap: 11 (ref 5–15)
BUN: 10 mg/dL (ref 8–23)
CO2: 27 mmol/L (ref 22–32)
Calcium: 7.8 mg/dL — ABNORMAL LOW (ref 8.9–10.3)
Chloride: 96 mmol/L — ABNORMAL LOW (ref 98–111)
Creatinine, Ser: 0.37 mg/dL — ABNORMAL LOW (ref 0.44–1.00)
GFR, Estimated: >60 mL/min (ref >60)
Glucose, Bld: 120 mg/dL — ABNORMAL HIGH (ref 70–99)
Potassium: 2.7 mmol/L — CL (ref 3.5–5.1)
Sodium: 134 mmol/L — ABNORMAL LOW (ref 135–145)

## 2020-03-25 LAB — CBC
HCT: 26.2 % — ABNORMAL LOW (ref 36.0–46.0)
Hemoglobin: 8.3 g/dL — ABNORMAL LOW (ref 12.0–15.0)
MCH: 27.7 pg (ref 26.0–34.0)
MCHC: 31.7 g/dL (ref 30.0–36.0)
MCV: 87.3 fL (ref 80.0–100.0)
Platelets: 327 10*3/uL (ref 150–400)
RBC: 3 MIL/uL — ABNORMAL LOW (ref 3.87–5.11)
RDW: 17 % — ABNORMAL HIGH (ref 11.5–15.5)
WBC: 7.5 10*3/uL (ref 4.0–10.5)
nRBC: 0 % (ref 0.0–0.2)

## 2020-03-25 LAB — MAGNESIUM: Magnesium: 1.5 mg/dL — ABNORMAL LOW (ref 1.7–2.4)

## 2020-03-25 LAB — CULTURE, BLOOD (ROUTINE X 2)

## 2020-03-25 MED ORDER — POTASSIUM CHLORIDE CRYS ER 20 MEQ PO TBCR
40.0000 meq | EXTENDED_RELEASE_TABLET | Freq: Once | ORAL | Status: AC
  Administered 2020-03-25: 40 meq via ORAL
  Filled 2020-03-25: qty 2

## 2020-03-25 MED ORDER — ALBUTEROL SULFATE (2.5 MG/3ML) 0.083% IN NEBU: 2.5000 mg | INHALATION_SOLUTION | RESPIRATORY_TRACT | Status: DC | PRN

## 2020-03-25 MED ORDER — POTASSIUM CHLORIDE 10 MEQ/100ML IV SOLN
10.0000 meq | INTRAVENOUS | Status: AC
  Administered 2020-03-25 (×4): 10 meq via INTRAVENOUS
  Filled 2020-03-25 (×5): qty 100

## 2020-03-25 MED ORDER — MAGNESIUM SULFATE 4 GM/100ML IV SOLN
4.0000 g | Freq: Once | INTRAVENOUS | Status: AC
  Administered 2020-03-25: 4 g via INTRAVENOUS
  Filled 2020-03-25: qty 100

## 2020-03-25 MED ORDER — IPRATROPIUM-ALBUTEROL 0.5-2.5 (3) MG/3ML IN SOLN
3.0000 mL | Freq: Three times a day (TID) | RESPIRATORY_TRACT | Status: DC
  Filled 2020-03-25: qty 3

## 2020-03-25 MED ORDER — PREDNISONE 20 MG PO TABS: 40.0000 mg | ORAL_TABLET | Freq: Every day | ORAL | 0 refills | Status: AC

## 2020-03-25 NOTE — Progress Notes (Signed)
Mobility Specialist - Progress Note   03/25/20 1224  Mobility  Activity  (scooted to edge of chair)  Level of Assistance Contact guard assist, steadying assist  Mobility Response Tolerated fair  Mobility performed by Mobility specialist  $Mobility charge 1 Mobility    Pt sitting on recliner upon arrival. Communicated w/ pt via writing on paper. Pt agreed to session. Had no c/o pain. Pt agreeable to S2S x 3. Pt able to scoot to edge of chair to prepare to S2S w/ CGA. Attempt to S2S deferred d/t O2 desat to 81%. Pt on 4L O2 [place]. Noted pt SOB and desats w/ minimal exertion. Deferred mobility session at this time. Required extra time for O2 to sat up to 89%. Attempted to assist pt to scoot back into chair, but pt had incontinent episode. Mobility specialist notified nurse of O2 desat and incontinent episode. While on the phone w/ nurse, pt had to spit up. Mobility specialist noted pt had spit up some blood. Nurse came into room immediately. Pt left sitting on recliner w/ nurse present in room.      Kalai Baca Mobility Specialist  03/25/20, 12:33 PM

## 2020-03-25 NOTE — Discharge Summary (Signed)
Physician Discharge Summary  Bobette Leyh ESL:[id number] DOB: 04-13-1941 DOA: 03/21/2020  PCP: Juluis Pitch, MD  Admit date: 03/21/2020 Discharge date: 03/25/2020  Admitted From: home Disposition:  SNF  Recommendations for Outpatient Follow-up:  1. Follow up with PCP in 1 week 2.   Home Health: no  Equipment/Devices:   Discharge Condition: stable CODE STATUS: DNR Diet recommendation: Heart Healthy   Brief/Interim Summary: HPI was taken from Dr. Damita Dunnings: Zunaira Lamy is a 79 y.o. female with medical history significant for COPD, chronic hypoxic respiratory failure on home O2 at 4 L/min, grade 1 diastolic dysfunction, last EF 60 to 65% September 2021, deaf mutism, chronic anemia, recently hospitalized in September for hip fracture during which she had NSTEMI versus demand ischemia per cardiology, who was sent into the emergency room for evaluation because of bleeding wounds on the buttocks.  History is limited due to the difficulty with communication as patient does not know sign language and communicates through reading written questions and nodding or shaking her head. ED course: On arrival she was afebrile but was tachycardic at 99 and tachypneic at 26 with O2 sat 90% on O2 at her home flow rate of 4.  Blood work revealed WBC of 28,000 with lactic acid 1.1.   hemoglobin of 10, baseline around 8.  BMP revealed creatinine of 1.26 with hyperkalemia of 5.7 and metabolic acidosis with bicarb of 17.  Troponin 40, BNP 383.  She had slight elevation in liver enzymes with alk phos 185 and total bili 1.6.  Transaminases normal.  Chest x-ray showed patchy airspace consolidation versus atelectasis in bilateral lower lobes. EKG as interpreted by me: Sinus tachycardia at 101 with nonspecific ST-T wave changes. Patient started on sepsis fluid bolus for treatment of sepsis secondary to pneumonia as well as IV antibiotics.  Hospitalist consulted for admission.  Hospital Course from Dr. Lenise Herald  11/3-11/4/21: Pt presented w/ severe sepsis secondary to likely CAP and UTI. Pt completed  5 day course of IV rocephin, azithromycin while inpatient.. Pt also treated w/ bronchodilators, steroids and was able to be weaned back down to home O2 level of 4L [place]. Of note, pt's altered mental status was likely secondary to above stated infections. Also, PT/OT saw the pt and recommended SNF. For more information, please see previous progress notes.   Discharge Diagnoses:  Principal Problem:   Sepsis ([place]) Active Problems:   AKI (acute kidney injury) ([place])   Deafness   Pressure injury of skin   Chronic diastolic CHF (congestive heart failure) (HCC)   Chronic respiratory failure with hypoxia (HCC)   Metabolic acidosis   Hyperkalemia   Chronic anemia   COPD with chronic bronchitis (HCC)   CAP (community acquired pneumonia)   Acute maculopapular rash   Severe sepsis: meet criteria with tachycardia, tachypnea, hypoxia, leukocytosis, and AKI due to CAP and UTI. Resolved  Acute on chronic hypoxic respiratory failure: back to baseline of 4L [place].    Pneumonia: completed 5 day course of abxs. Continue on bronchodilators and encourage incentive spirometry & flutter valve   Altered mental status: etiology unclear, possibly secondary to intermittent hypoxia, that resolved quickly vs partial complex seizure. CT head shows no acute intracranial findings. EEG showed moderate diffuse encephalopathy, no seizure or epileptiform discharges. Likely back to baseline   Acute exacerbation of COPD: continue on bronchodilators & steroids. Completed azithromycin course. Encourage incentive spirometry   Likely PAF: continue on home dose of cardizem, metoprolol.   UTI: completed abx course. Urine cx is growing  multiple species. Repeat urine cx unlikely to grow anything as pt has been on IV abxs   AKI: baseline Cr 0.88. Resolved   Hyperkalemia: resolved  Rash, maculopapular: possibly contact  dermatitisrelated to moisturevs yeast, on buttock, posterior thigh. Continue w/ barrier precautions, nystatin cream. Continue w/ wound care  Pressure injury of skin: continue w/ supportive & wound care   Chronic diastolic CHF: continue on home dose of lasix. Monitor I/Os.   HTN: continue on metoprolol, cardizem.   Hx of NSTEMI: in September 2021.Continue clopidogrel, aspirin, atorvastatin, metoprolol. Troponins are mildly elevated but significantly less than what it was in 01/2020, likely demand ishchemia   Chronic anemia: H&H are trending up today. No need for a transfusion currently.   Deaf mutism: patient communicates best through written word.   Discharge Instructions  Discharge Instructions    Diet - low sodium heart healthy   Complete by: As directed    Discharge instructions   Complete by: As directed    F/u PCP in 1 week   Discharge wound care:   Complete by: As directed    Wound care as stated above   Increase activity slowly   Complete by: As directed      Allergies as of 03/25/2020      Reactions   Hydrocodone    Other reaction(s): Unknown   Naproxen    Other reaction(s): Unknown   Other Other (See Comments)   nuts   Sulfamethoxazole-trimethoprim    Other reaction(s): Unknown   Tramadol    Other reaction(s): Unknown Pt has tolerated this med 1/5-1/7 with no issues      Medication List    TAKE these medications   acetaminophen 325 MG tablet Commonly known as: TYLENOL Take 2 tablets (650 mg total) by mouth every 6 (six) hours. What changed: additional instructions   acidophilus Caps capsule Take 1 capsule by mouth 3 (three) times daily.   albuterol 108 (90 Base) MCG/ACT inhaler Commonly known as: VENTOLIN HFA Inhale 2 puffs into the lungs every 4 (four) hours as needed for wheezing or shortness of breath.   albuterol (2.5 MG/3ML) 0.083% nebulizer solution Commonly known as: PROVENTIL Take 2.5 mg by nebulization every 6 (six) hours as needed  for wheezing or shortness of breath.   alendronate 70 MG tablet Commonly known as: FOSAMAX Take 70 mg by mouth once a week. Take with a full glass of water on an empty stomach.   aluminum-magnesium hydroxide-simethicone 294-765-46 MG/5ML Susp Commonly known as: MAALOX Take 30 mLs by mouth 4 (four) times daily -  before meals and at bedtime.   ascorbic acid 500 MG tablet Commonly known as: VITAMIN C Take 500 mg by mouth daily.   aspirin EC 81 MG tablet Take 81 mg by mouth daily. Swallow whole.   atorvastatin 80 MG tablet Commonly known as: LIPITOR Take 1 tablet (80 mg total) by mouth daily.   Calcium 600-D 600-400 MG-UNIT Tabs Generic drug: Calcium Carbonate-Vitamin D3 Take 1 tablet by mouth 2 (two) times daily.   cetirizine 10 MG tablet Commonly known as: ZYRTEC Take 10 mg by mouth daily.   clopidogrel 75 MG tablet Commonly known as: PLAVIX Take 1 tablet (75 mg total) by mouth daily.   diclofenac Sodium 1 % Gel Commonly known as: VOLTAREN Apply 1 application topically 4 (four) times daily.   diltiazem 120 MG 24 hr capsule Commonly known as: CARDIZEM CD Take 1 capsule (120 mg total) by mouth daily.   diphenhydrAMINE 25 MG tablet  Commonly known as: BENADRYL Take 25 mg by mouth every 6 (six) hours as needed for allergies.   docusate sodium 100 MG capsule Commonly known as: COLACE Take 1 capsule (100 mg total) by mouth 2 (two) times daily.   escitalopram 10 MG tablet Commonly known as: LEXAPRO Take 10 mg by mouth at bedtime.   feeding supplement (PRO-STAT SUGAR FREE 64) Liqd Take 30 mLs by mouth 2 (two) times daily.   feeding supplement Liqd Take 237 mLs by mouth 3 (three) times daily between meals.   ferrous sulfate 300 (60 Fe) MG/5ML syrup Take 300 mg by mouth in the morning and at bedtime.   Fluticasone-Salmeterol 100-50 MCG/DOSE Aepb Commonly known as: ADVAIR Inhale 1 puff into the lungs 2 (two) times daily.   furosemide 20 MG tablet Commonly known  as: LASIX Take 20 mg by mouth daily.   guaifenesin 100 MG/5ML syrup Commonly known as: ROBITUSSIN Take 300 mg by mouth 4 (four) times daily as needed for cough.   loperamide 2 MG tablet Commonly known as: IMODIUM A-D Take 2 mg by mouth daily as needed for diarrhea or loose stools.   magnesium hydroxide 400 MG/5ML suspension Commonly known as: MILK OF MAGNESIA Take 30 mLs by mouth daily as needed for mild constipation.   metoCLOPramide 5 MG tablet Commonly known as: REGLAN Take 5 mg by mouth every 4 (four) hours as needed for nausea. If ondansetron does not work, give reglan.   metoprolol tartrate 25 MG tablet Commonly known as: LOPRESSOR Take 6.25 mg by mouth 2 (two) times daily.   nitroGLYCERIN 0.4 MG SL tablet Commonly known as: NITROSTAT Place 1 tablet (0.4 mg total) under the tongue every 5 (five) minutes as needed for chest pain.   nystatin cream Commonly known as: MYCOSTATIN Apply 1 application topically 2 (two) times daily.   omeprazole 40 MG capsule Commonly known as: PRILOSEC Take 40 mg by mouth daily.   ondansetron 4 MG tablet Commonly known as: ZOFRAN Take 1 tablet (4 mg total) by mouth every 6 (six) hours as needed for nausea.   OXYGEN Inhale 2 L/min into the lungs continuous.   potassium chloride SA 20 MEQ tablet Commonly known as: KLOR-CON Take 40 mEq by mouth daily.   predniSONE 20 MG tablet Commonly known as: Deltasone Take 2 tablets (40 mg total) by mouth daily for 5 days.   tiZANidine 4 MG tablet Commonly known as: ZANAFLEX Take 4 mg by mouth daily.   traMADol 50 MG tablet Commonly known as: ULTRAM Take 50 mg by mouth every 8 (eight) hours.   Vitamin D 125 MCG (5000 UT) Caps Take 5,000 Units by mouth daily.            Discharge Care Instructions  (From admission, onward)         Start     Ordered   03/25/20 0000  Discharge wound care:       Comments: Wound care as stated above   03/25/20 1135          Contact information  for after-discharge care    Destination    [facility] Preferred SNF .   Service: Skilled Nursing Contact information: [place] [phone]                 Allergies  Allergen Reactions  . Hydrocodone     Other reaction(s): Unknown  . Naproxen     Other reaction(s): Unknown  . Other Other (  See Comments)    nuts  . Sulfamethoxazole-Trimethoprim     Other reaction(s): Unknown  . Tramadol     Other reaction(s): Unknown Pt has tolerated this med 1/5-1/7 with no issues    Consultations:     Procedures/Studies: EEG  Result Date: 03/24/2020 Lora Havens, MD     03/24/2020 10:26 AM Patient Name: Amarrah Meinhart MRN: [id number] Epilepsy Attending: Lora Havens Referring Physician/Provider: Dr Karie Kirks Date: 03/24/2020 Duration: 22.49 mins Patient history: 79yo F with ams. EEG to evaluate for seizure Level of alertness:  lethargic AEDs during EEG study: None Technical aspects: This EEG study was done with scalp electrodes positioned according to the 10-20 International system of electrode placement. Electrical activity was acquired at a sampling rate of _0  and reviewed with a high frequency filter of _1  and a low frequency filter of _2 . EEG data were recorded continuously and digitally stored. Description: No clear posterior dominant rhythm was seen. EEG showed continuous generalized 2 to 5 Hz theta-delta slowing. Physiologic photic driving was not seen during photic stimulation.  Hyperventilation was not performed.   ABNORMALITY -Continuous slow, generalized IMPRESSION: This study is suggestive of moderate diffuse encephalopathy, nonspecific etiology. No seizures or epileptiform discharges were seen throughout the recording. Lora Havens   DG Chest 1 View  Result Date: 03/23/2020 CLINICAL DATA:  Shortness of breath EXAM: CHEST  1 VIEW COMPARISON:  03/22/2020 FINDINGS: Hyperinflation of the lungs. Bilateral  lower lobe airspace opacities are again noted, unchanged. Heart is normal size. Old bilateral rib fractures. IMPRESSION: COPD/chronic changes. Bilateral lower lobe airspace opacities, stable. Cannot exclude pneumonia. Electronically Signed   By: Rolm Baptise M.D.   On: 03/23/2020 02:54   DG Chest 2 View  Result Date: 03/21/2020 CLINICAL DATA:  Shortness of breath. EXAM: CHEST - 2 VIEW COMPARISON:  January 30, 2020 FINDINGS: Upper limits of normal cardiac silhouette. Calcific atherosclerotic disease and tortuosity of the aorta. Severe upper lobe predominant emphysematous changes of the lungs are chronic. Patchy airspace consolidation versus atelectasis in bilateral lower lobes. IMPRESSION: 1. Patchy airspace consolidation versus atelectasis in bilateral lower lobes. 2. 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 Electronically Signed   By: Fidela Salisbury M.D.   On: 03/21/2020 17:59   CT HEAD WO CONTRAST  Result Date: 03/22/2020 CLINICAL DATA:  Mental status change. EXAM: CT HEAD WITHOUT CONTRAST TECHNIQUE: Contiguous axial images were obtained from the base of the skull through the vertex without intravenous contrast. COMPARISON:  01/17/2019 FINDINGS: Brain: There is no evidence of an acute infarct, intracranial hemorrhage, mass, midline shift, or extra-axial fluid collection. The ventricles and sulci are within normal limits for age. Patchy hypodensities in the cerebral white matter bilaterally are unchanged and nonspecific but compatible with moderate chronic small vessel ischemic disease. Vascular: Calcified atherosclerosis at the skull base. No hyperdense vessel. Skull: No acute fracture or suspicious osseous lesion. Sinuses/Orbits: Moderate volume fluid in the right maxillary sinus. No significant mastoid fluid. Bilateral cataract extraction. Other: None. IMPRESSION: 1. No evidence of acute intracranial abnormality. 2. Moderate chronic small vessel ischemic disease. 3. Right  maxillary sinus fluid, correlate for acute sinusitis. Electronically Signed   By: Logan Bores M.D.   On: 03/22/2020 14:55   DG Chest Port 1 View  Result Date: 03/23/2020 CLINICAL DATA:  Shortness of breath. EXAM: PORTABLE CHEST 1 VIEW COMPARISON:  03/23/2020.  05/28/2019.  CT 01/21/2020. FINDINGS: Mediastinum hilar structures stable. Heart size stable. Persistent bibasilar pulmonary infiltrates. Severe bullous changes consistent  with COPD. Surgical sutures noted over the left lung. No prominent pleural effusion. No pneumothorax. Degenerative change thoracic spine. Old bilateral rib fractures and postsurgical changes of the 6 rib again noted. IMPRESSION: Persistent bibasilar pulmonary infiltrates. No interim change. Severe bullous COPD. Postsurgical changes left lung. Electronically Signed   By: Marcello Moores  Register   On: 03/23/2020 10:29   DG Chest Port 1 View  Result Date: 03/22/2020 CLINICAL DATA:  Shortness of breath. EXAM: PORTABLE CHEST 1 VIEW COMPARISON:  March 21, 2020 FINDINGS: The lungs are hyperinflated. Emphysematous lung disease is seen with mild to moderate severity chronic appearing increased interstitial lung markings. Mild areas of atelectasis and/or infiltrate are seen within the bilateral lung bases. There is no evidence of a pleural effusion or pneumothorax. The heart size and mediastinal contours are within normal limits. Multiple chronic bilateral rib deformities are seen. IMPRESSION: 1. Emphysema and chronic appearing increased interstitial lung markings, with mild bibasilar atelectasis and/or infiltrate. Electronically Signed   By: Virgina Norfolk M.D.   On: 03/22/2020 03:58       Subjective: Pt c/o fatigue    Discharge Exam: Vitals:   03/25/20 0738 03/25/20 0805  BP: (!) 132/49   Pulse: 72 66  Resp: 17 20  Temp: 98 F (36.7 C)   SpO2: 96% 95%   Vitals:   03/25/20 0433 03/25/20 0559 03/25/20 0738 03/25/20 0805  BP: (!) 129/55 (!) 144/65 (!) 132/49   Pulse: (!) 58  (!) 52 72 66  Resp: _0 Temp: (!) 97.2 F (36.2 C) 97.7 F (36.5 C) 98 F (36.7 C)   TempSrc: Oral Oral Oral   SpO2: 95% 97% 96% 95%  Weight:      Height:        General: Pt is alert, awake, not in acute distress Cardiovascular:S1/S2 +, no rubs, no gallops Respiratory: diminished breath sounds b/l otherwise clear  Abdominal: Soft, NT, ND, bowel sounds + Extremities: no cyanosis    The results of significant diagnostics from this hospitalization (including imaging, microbiology, ancillary and laboratory) are listed below for reference.     Microbiology: Recent Results (from the past 240 hour(s))  Blood Culture (routine x 2)     Status: None (Preliminary result)   Collection Time: 03/21/20  6:12 PM   Specimen: BLOOD  Result Value Ref Range Status   Specimen Description BLOOD LEFT ANTECUBITAL  Final   Special Requests   Final    BOTTLES DRAWN AEROBIC AND ANAEROBIC Blood Culture adequate volume   Culture   Final    NO GROWTH 4 DAYS Performed at [facility], [address] [place], [place] [address]    Report Status PENDING  Incomplete  Blood Culture (routine x 2)     Status: None (Preliminary result)   Collection Time: 03/21/20  6:17 PM   Specimen: BLOOD  Result Value Ref Range Status   Specimen Description BLOOD BLOOD RIGHT ARM  Final   Special Requests   Final    BOTTLES DRAWN AEROBIC AND ANAEROBIC Blood Culture results may not be optimal due to an excessive volume of blood received in culture bottles   Culture  Setup Time IN BOTH AEROBIC AND ANAEROBIC BOTTLES  Final   Culture   Final    NO GROWTH 4 DAYS Performed at [facility], [address] [place], [place] [address]    Report Status PENDING  Incomplete  Respiratory Panel by RT PCR (Flu A&B, Covid) - Nasopharyngeal Swab  Status: None   Collection Time: 03/21/20  6:26 PM   Specimen: Nasopharyngeal Swab  Result Value Ref Range Status   SARS Coronavirus 2 by RT PCR NEGATIVE  NEGATIVE Final    Comment: (NOTE) SARS-CoV-2 target nucleic acids are NOT DETECTED.  The SARS-CoV-2 RNA is generally detectable in upper respiratoy specimens during the acute phase of infection. The lowest concentration of SARS-CoV-2 viral copies this assay can detect is 131 copies/mL. A negative result does not preclude SARS-Cov-2 infection and should not be used as the sole basis for treatment or other patient management decisions. A negative result may occur with  improper specimen collection/handling, submission of specimen other than nasopharyngeal swab, presence of viral mutation(s) within the areas targeted by this assay, and inadequate number of viral copies (<131 copies/mL). A negative result must be combined with clinical observations, patient history, and epidemiological information. The expected result is Negative.  Fact Sheet for Patients:  PinkCheek.be  Fact Sheet for Healthcare Providers:  GravelBags.it  This test is no t yet approved or cleared by the [place] FDA and  has been authorized for detection and/or diagnosis of SARS-CoV-2 by FDA under an Emergency Use Authorization (EUA). This EUA will remain  in effect (meaning this test can be used) for the duration of the COVID-19 declaration under Section 564(b)(1) of the Act, 21 U.S.C. section 360bbb-3(b)(1), unless the authorization is terminated or revoked sooner.     Influenza A by PCR NEGATIVE NEGATIVE Final   Influenza B by PCR NEGATIVE NEGATIVE Final    Comment: (NOTE) The Xpert Xpress SARS-CoV-2/FLU/RSV assay is intended as an aid in  the diagnosis of influenza from Nasopharyngeal swab specimens and  should not be used as a sole basis for treatment. Nasal washings and  aspirates are unacceptable for Xpert Xpress SARS-CoV-2/FLU/RSV  testing.  Fact Sheet for Patients: PinkCheek.be  Fact Sheet for Healthcare  Providers: GravelBags.it  This test is not yet approved or cleared by the [place] FDA and  has been authorized for detection and/or diagnosis of SARS-CoV-2 by  FDA under an Emergency Use Authorization (EUA). This EUA will remain  in effect (meaning this test can be used) for the duration of the  Covid-19 declaration under Section 564(b)(1) of the Act, 21  U.S.C. section 360bbb-3(b)(1), unless the authorization is  terminated or revoked. Performed at [facility], [address] [place], [place] [address]   Urine culture     Status: Abnormal   Collection Time: 03/21/20 11:34 PM   Specimen: Urine, Random  Result Value Ref Range Status   Specimen Description   Final    URINE, RANDOM Performed at [facility], [address] [place], [place] [address]    Special Requests   Final    NONE Performed at [facility], [address] [place], [place] [address]    Culture MULTIPLE SPECIES PRESENT, SUGGEST RECOLLECTION (A)  Final   Report Status 03/22/2020 FINAL  Final     Labs: BNP (last 3 results) Recent Labs    01/24/20 0427 01/29/20 1029 03/21/20 1720  BNP 797.4* 2,160.7* 300.9*   Basic Metabolic Panel: Recent Labs  Lab 03/22/20 0336 03/23/20 0414 03/23/20 1400 03/24/20 0420 03/25/20 0421  NA 135 138 135 135 134*  K 4.5 2.9* 3.1* 3.8 2.7*  CL 103 103 100 100 96*  CO2 17* 19* _0 GLUCOSE 87 82 128* 94 120*  BUN 28* _1 CREATININE 0.82 0.59 0.47 0.54  0.37*  CALCIUM 7.5* 8.2* 8.0* 8.3* 7.8*  MG  --   --   --   --  1.5*   Liver Function Tests: Recent Labs  Lab 03/21/20 1812  AST 35  ALT 37  ALKPHOS 185*  BILITOT 1.6*  PROT 6.8  ALBUMIN 3.1*   No results for input(s): LIPASE, AMYLASE in the last 168 hours. No results for input(s): AMMONIA in the last 168 hours. CBC: Recent Labs  Lab 03/21/20 1720 03/22/20 0336 03/23/20 0414 03/24/20 0420 03/25/20 0421  WBC 28.0* 28.7* 16.0*  14.1* 7.5  NEUTROABS  --   --  14.1* 12.2*  --   HGB 10.0* 8.5* 8.4* 7.7* 8.3*  HCT 32.3* 27.9* 26.6* 24.4* 26.2*  MCV 89.5 90.9 88.1 86.5 87.3  PLT 510* 477* 386 371 327   Cardiac Enzymes: No results for input(s): CKTOTAL, CKMB, CKMBINDEX, TROPONINI in the last 168 hours. BNP: Invalid input(s): POCBNP CBG: Recent Labs  Lab 03/23/20 1001  GLUCAP 88   D-Dimer No results for input(s): DDIMER in the last 72 hours. Hgb A1c No results for input(s): HGBA1C in the last 72 hours. Lipid Profile No results for input(s): CHOL, HDL, LDLCALC, TRIG, CHOLHDL, LDLDIRECT in the last 72 hours. Thyroid function studies No results for input(s): TSH, T4TOTAL, T3FREE, THYROIDAB in the last 72 hours.  Invalid input(s): FREET3 Anemia work up No results for input(s): VITAMINB12, FOLATE, FERRITIN, TIBC, IRON, RETICCTPCT in the last 72 hours. Urinalysis    Component Value Date/Time   COLORURINE YELLOW (A) 03/21/2020 2334   APPEARANCEUR HAZY (A) 03/21/2020 2334   LABSPEC 1.017 03/21/2020 2334   PHURINE 5.0 03/21/2020 [place] 03/21/2020 [place] 03/21/2020 2334   BILIRUBINUR NEGATIVE 03/21/2020 2334   KETONESUR 20 (A) 03/21/2020 2334   PROTEINUR NEGATIVE 03/21/2020 2334   NITRITE NEGATIVE 03/21/2020 2334   LEUKOCYTESUR MODERATE (A) 03/21/2020 2334   Sepsis Labs Invalid input(s): PROCALCITONIN,  WBC,  LACTICIDVEN Microbiology Recent Results (from the past 240 hour(s))  Blood Culture (routine x 2)     Status: None (Preliminary result)   Collection Time: 03/21/20  6:12 PM   Specimen: BLOOD  Result Value Ref Range Status   Specimen Description BLOOD LEFT ANTECUBITAL  Final   Special Requests   Final    BOTTLES DRAWN AEROBIC AND ANAEROBIC Blood Culture adequate volume   Culture   Final    NO GROWTH 4 DAYS Performed at [facility], [address], [place], [place] [address]    Report Status PENDING  Incomplete  Blood Culture (routine x 2)     Status:  None (Preliminary result)   Collection Time: 03/21/20  6:17 PM   Specimen: BLOOD  Result Value Ref Range Status   Specimen Description BLOOD BLOOD RIGHT ARM  Final   Special Requests   Final    BOTTLES DRAWN AEROBIC AND ANAEROBIC Blood Culture results may not be optimal due to an excessive volume of blood received in culture bottles   Culture  Setup Time IN BOTH AEROBIC AND ANAEROBIC BOTTLES  Final   Culture   Final    NO GROWTH 4 DAYS Performed at [facility], [address] [place], [place] [address]    Report Status PENDING  Incomplete  Respiratory Panel by RT PCR (Flu A&B, Covid) - Nasopharyngeal Swab     Status: None   Collection Time: 03/21/20  6:26 PM   Specimen: Nasopharyngeal Swab  Result Value Ref Range Status  SARS Coronavirus 2 by RT PCR NEGATIVE NEGATIVE Final    Comment: (NOTE) SARS-CoV-2 target nucleic acids are NOT DETECTED.  The SARS-CoV-2 RNA is generally detectable in upper respiratoy specimens during the acute phase of infection. The lowest concentration of SARS-CoV-2 viral copies this assay can detect is 131 copies/mL. A negative result does not preclude SARS-Cov-2 infection and should not be used as the sole basis for treatment or other patient management decisions. A negative result may occur with  improper specimen collection/handling, submission of specimen other than nasopharyngeal swab, presence of viral mutation(s) within the areas targeted by this assay, and inadequate number of viral copies (<131 copies/mL). A negative result must be combined with clinical observations, patient history, and epidemiological information. The expected result is Negative.  Fact Sheet for Patients:  PinkCheek.be  Fact Sheet for Healthcare Providers:  GravelBags.it  This test is no t yet approved or cleared by the [place] FDA and  has been authorized for detection and/or diagnosis of SARS-CoV-2  by FDA under an Emergency Use Authorization (EUA). This EUA will remain  in effect (meaning this test can be used) for the duration of the COVID-19 declaration under Section 564(b)(1) of the Act, 21 U.S.C. section 360bbb-3(b)(1), unless the authorization is terminated or revoked sooner.     Influenza A by PCR NEGATIVE NEGATIVE Final   Influenza B by PCR NEGATIVE NEGATIVE Final    Comment: (NOTE) The Xpert Xpress SARS-CoV-2/FLU/RSV assay is intended as an aid in  the diagnosis of influenza from Nasopharyngeal swab specimens and  should not be used as a sole basis for treatment. Nasal washings and  aspirates are unacceptable for Xpert Xpress SARS-CoV-2/FLU/RSV  testing.  Fact Sheet for Patients: PinkCheek.be  Fact Sheet for Healthcare Providers: GravelBags.it  This test is not yet approved or cleared by the [place] FDA and  has been authorized for detection and/or diagnosis of SARS-CoV-2 by  FDA under an Emergency Use Authorization (EUA). This EUA will remain  in effect (meaning this test can be used) for the duration of the  Covid-19 declaration under Section 564(b)(1) of the Act, 21  U.S.C. section 360bbb-3(b)(1), unless the authorization is  terminated or revoked. Performed at [facility], [address], [place], [place] [address]   Urine culture     Status: Abnormal   Collection Time: 03/21/20 11:34 PM   Specimen: Urine, Random  Result Value Ref Range Status   Specimen Description   Final    URINE, RANDOM Performed at [facility], [address], [place], [place] [address]    Special Requests   Final    NONE Performed at [facility], [address] [place], [place] [address]    Culture MULTIPLE SPECIES PRESENT, SUGGEST RECOLLECTION (A)  Final   Report Status 03/22/2020 FINAL  Final     Time coordinating discharge: Over 30 minutes  SIGNED:   Wyvonnia Dusky,  MD  [facility] 03/25/2020, 11:36 AM Pager   If 7PM-7AM, please contact night-coverage [email or web address]

## 2020-03-25 NOTE — TOC Transition Note (Signed)
Transition of Care [facility]) - CM/SW Discharge Note   Patient Details  Name: Alice Wallace MRN: [id number] Date of Birth: Dec 29, 1940  Transition of Care [facility]) CM/SW Contact:  Maree Krabbe, LCSW Phone Number: 03/25/2020, 11:55 AM   Clinical Narrative:   Clinical Social Worker facilitated patient discharge including contacting patient family and facility to confirm patient discharge plans.  Clinical information faxed to facility and family agreeable with plan.  CSW arranged ambulance transport via [facility] to [facility].  RN to call [phone] for report prior to discharge.    Final next level of care: Skilled Nursing Facility Barriers to Discharge: No Barriers Identified   Patient Goals and CMS Choice Patient states their goals for this hospitalization and ongoing recovery are:: to get pt better   Choice offered to / list presented to : [facility] POA / Guardian  Discharge Placement              Patient chooses bed at: [facility] Patient to be transferred to facility by: [facility] Name of family member notified: legal guardian Patient and family notified of of transfer: 03/25/20  Discharge Plan and Services In-house Referral: NA   Post Acute Care Choice: Skilled Nursing Facility                               Social Determinants of Health (SDOH) Interventions     Readmission Risk Interventions Readmission Risk Prevention Plan 03/24/2020  Transportation Screening Complete  PCP or Specialist Appt within 3-5 Days Complete  HRI or Home Care Consult Complete  Social Work Consult for Recovery Care Planning/Counseling Complete  Palliative Care Screening Not Applicable  Medication Review [facility]) Complete

## 2020-03-26 LAB — CULTURE, BLOOD (ROUTINE X 2)
Culture: NO GROWTH
Culture: NO GROWTH
Special Requests: ADEQUATE

## 2020-03-30 ENCOUNTER — Other Ambulatory Visit: Payer: Self-pay

## 2020-03-30 ENCOUNTER — Non-Acute Institutional Stay: Payer: Medicare Other | Admitting: Primary Care

## 2020-03-30 DIAGNOSIS — J441 Chronic obstructive pulmonary disease with (acute) exacerbation: Secondary | ICD-10-CM

## 2020-03-30 DIAGNOSIS — I5032 Chronic diastolic (congestive) heart failure: Secondary | ICD-10-CM

## 2020-03-30 DIAGNOSIS — Z515 Encounter for palliative care: Secondary | ICD-10-CM

## 2020-03-30 NOTE — Progress Notes (Signed)
Therapist, nutritional Palliative Care Consult Note Telephone: [phone]  Fax: [phone]   TELEHEALTH VISIT STATEMENT Due to the COVID-19 crisis, this visit was done via telemedicine from my office. It was initiated and consented to by this patient and/or family.   Date of encounter: 03/30/20 PATIENT NAME: Alice Wallace [address] [place] [address] [phone] (home)  DOB: 1940-08-13 MRN: [id number]  PRIMARY CARE PROVIDER:    Rosetta Posner, MD,  [address] [place] [address] [phone]  REFERRING PROVIDER:   Rosetta Posner, MD [address] [place],  [place] [address] [phone]  RESPONSIBLE PARTY:   Extended Emergency Contact Information Primary Emergency Contact: Annell Greening Work Phone: [phone] Mobile Phone: [phone] Relation: Legal Guardian Secondary Emergency Contact: Rodena Medin Mobile Phone: [phone] Relation: Other   Palliative Care was asked to follow this patient by consultation request of Rosetta Posner, MD to help address advance care planning and goals of care. This is the initital visit.   ASSESSMENT AND RECOMMENDATIONS:   1. Advance Care Planning/Goals of Care: Goals include to maximize quality of life and symptom management. Attempt to reach DSS guardian Alice Wallace, no ability to leave message on cell. Discussed POC with LPN K Hicks. Patient returned to ALF several weeks ago but fell ill again, going to hospital on 10/31. She stayed 5 days and again came to [facility] for care and rehab. She may now need a higher level of care as she was back at ALF a short time before rehospitalization.  2. Symptom Management:   Patient is improving at [facility]. She is eating at her baseline, 25-50%. She is able to get oob to w/c with assistance.She can go to bathroom with assistance.  Her communication consists of reading lips and being able to read some notes. She denies pain, constipation and insomnia.  3. Follow up  Palliative Care Visit: Palliative care will continue to follow for goals of care clarification and symptom management. Return 4 weeks or prn.  4. Family /Caregiver/Community Supports: DSS is guardian, several attempts to reach over past several patient visits, no answer. Currently in LTC and appears to need this level of care at this writing, due to self care deficits.  5. Cognitive / Functional decline: A and O x 2, Deaf/mute. Needs assistance with most adls, dependent in all iadls.   I spent 25 minutes providing this consultation,  from 1400 to 1425. More than 50% of the time in this consultation was spent coordinating communication.   CODE STATUS:DNR  PPS: 40%  HOSPICE ELIGIBILITY/DIAGNOSIS: TBD  Subjective:  CHIEF COMPLAINT: debility  HISTORY OF PRESENT ILLNESS:  Alice Wallace is a 79 y.o. year old female  with recent SNF d/c to ALF and return to hospital with PNA, UTI and skin ulcers. She is now in SNF for rehab and assessment of LOC .   We are asked to consult around goals of care and ACP.    History obtained from review of EMR,  And discussion with primary team. Records reviewed and summarized above.   Review and summarization of old Epic records shows or history from other than patient  shows recent course of hospital stay and treatment of infections, d/c to SNF.  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   CAP (community acquired pneumonia) 03/21/2020   Sepsis (HCC) 03/21/2020   Malnutrition of moderate degree 02/03/2020   Chronic diastolic CHF (congestive heart failure) (HCC)  02/03/2020   Pressure injury of skin 02/01/2020   Nondisplaced fracture of base of neck of right femur, initial encounter for closed fracture (HCC)    Goals of care, counseling/discussion    Palliative care by specialist    Hip fracture, unspecified laterality, closed, initial encounter (HCC) 01/26/2020   Chronic obstructive pulmonary disease with acute exacerbation (HCC) 01/21/2020   Non-STEMI (non-ST elevated myocardial infarction) (HCC) 01/21/2020   Acute respiratory failure with hypoxia and hypercapnia (HCC)    Pneumonia due to COVID-19 virus 06/07/2019   Hypokalemia 06/07/2019   AKI (acute kidney injury) (HCC) 06/07/2019   Deafness 06/07/2019   COVID-19 virus infection 05/27/2019   Essential hypertension 11/29/2018   PAST MEDICAL HISTORY:  Active Ambulatory Problems    Diagnosis Date Noted   COVID-19 virus infection 05/27/2019   Pneumonia due to COVID-19 virus 06/07/2019   Hypokalemia 06/07/2019   AKI (acute kidney injury) (HCC) 06/07/2019   Deafness 06/07/2019   Acute respiratory failure with hypoxia and hypercapnia (HCC)    Chronic obstructive pulmonary disease with acute exacerbation (HCC) 01/21/2020   Non-STEMI (non-ST elevated myocardial infarction) (HCC) 01/21/2020   Hip fracture, unspecified laterality, closed, initial encounter (HCC) 01/26/2020   Goals of care, counseling/discussion    Palliative care by specialist    Nondisplaced fracture of base of neck of right femur, initial encounter for closed fracture (HCC)    Pressure injury of skin 02/01/2020   Malnutrition of moderate degree 02/03/2020   Chronic diastolic CHF (congestive heart failure) (HCC) 02/03/2020   Essential hypertension 11/29/2018   Gastroesophageal reflux disease with esophagitis 03/21/2020   Pressure injury of left buttock, stage 2 (HCC) 03/21/2020   Chronic respiratory failure with hypoxia (HCC) 03/21/2020   Metabolic acidosis 03/21/2020   Hyperkalemia  03/21/2020   Chronic anemia 03/21/2020   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   CAP (community acquired pneumonia) 03/21/2020   Sepsis (HCC) 03/21/2020   Acute maculopapular rash 03/22/2020   Resolved Ambulatory Problems    Diagnosis Date Noted   Acute hypoxemic respiratory failure due to COVID-19 (HCC) 05/27/2019   Acute on chronic respiratory failure with hypoxia and hypercapnia (HCC) 01/28/2020   Past Medical History:  Diagnosis Date   COPD (chronic obstructive pulmonary disease) (HCC)    Deaf    Hypertension    Osteoporosis    SOCIAL HX:  Social History   Tobacco Use   Smoking status: Never Smoker   Smokeless tobacco: Never Used  Substance Use Topics   Alcohol use: Not Currently   FAMILY HX: No family history on file.  ALLERGIES:  Allergies  Allergen Reactions   Hydrocodone     Other reaction(s): Unknown   Naproxen     Other reaction(s): Unknown   Other Other (See Comments)    nuts   Sulfamethoxazole-Trimethoprim     Other reaction(s): Unknown   Tramadol     Other reaction(s): Unknown Pt has tolerated this med 1/5-1/7 with no issues     PERTINENT MEDICATIONS:  Outpatient Encounter Medications as of 03/30/2020  Medication Sig   acetaminophen (TYLENOL) 325 MG tablet Take 2 tablets (650 mg total) by mouth every 6 (six) hours. (Patient taking differently: Take 650 mg by  mouth every 6 (six) hours. Not to exceed 4 grams / 24 hours)   acidophilus (RISAQUAD) CAPS capsule Take 1 capsule by mouth 3 (three) times daily.   albuterol (PROVENTIL) (2.5 MG/3ML) 0.083% nebulizer solution Take 2.5 mg by nebulization every 6 (six) hours as needed for wheezing or shortness of breath. (Patient not taking: Reported on 03/12/2020)   albuterol (VENTOLIN HFA) 108 (90 Base) MCG/ACT inhaler Inhale 2 puffs into the lungs every 4 (four) hours as needed for wheezing or shortness of breath.   alendronate (FOSAMAX) 70 MG tablet Take 70 mg by mouth once a week.  Take with a full glass of water on an empty stomach.   aluminum-magnesium hydroxide-simethicone (MAALOX) 200-200-20 MG/5ML SUSP Take 30 mLs by mouth 4 (four) times daily -  before meals and at bedtime.   Amino Acids-Protein Hydrolys (FEEDING SUPPLEMENT, PRO-STAT SUGAR FREE 64,) LIQD Take 30 mLs by mouth 2 (two) times daily.   ascorbic acid (VITAMIN C) 500 MG tablet Take 500 mg by mouth daily.   aspirin EC 81 MG tablet Take 81 mg by mouth daily. Swallow whole.   atorvastatin (LIPITOR) 80 MG tablet Take 1 tablet (80 mg total) by mouth daily.   Calcium Carbonate-Vitamin D3 (CALCIUM 600-D) 600-400 MG-UNIT TABS Take 1 tablet by mouth 2 (two) times daily.   cetirizine (ZYRTEC) 10 MG tablet Take 10 mg by mouth daily.   Cholecalciferol (VITAMIN D) 125 MCG (5000 UT) CAPS Take 5,000 Units by mouth daily.   clopidogrel (PLAVIX) 75 MG tablet Take 1 tablet (75 mg total) by mouth daily.   diclofenac Sodium (VOLTAREN) 1 % GEL Apply 1 application topically 4 (four) times daily.   diltiazem (CARDIZEM CD) 120 MG 24 hr capsule Take 1 capsule (120 mg total) by mouth daily.   diphenhydrAMINE (BENADRYL) 25 MG tablet Take 25 mg by mouth every 6 (six) hours as needed for allergies.    docusate sodium (COLACE) 100 MG capsule Take 1 capsule (100 mg total) by mouth 2 (two) times daily.   escitalopram (LEXAPRO) 10 MG tablet Take 10 mg by mouth at bedtime.   feeding supplement, ENSURE ENLIVE, (ENSURE ENLIVE) LIQD Take 237 mLs by mouth 3 (three) times daily between meals.   ferrous sulfate 300 (60 Fe) MG/5ML syrup Take 300 mg by mouth in the morning and at bedtime.    Fluticasone-Salmeterol (ADVAIR) 100-50 MCG/DOSE AEPB Inhale 1 puff into the lungs 2 (two) times daily.   furosemide (LASIX) 20 MG tablet Take 20 mg by mouth daily.   guaifenesin (ROBITUSSIN) 100 MG/5ML syrup Take 300 mg by mouth 4 (four) times daily as needed for cough.   loperamide (IMODIUM A-D) 2 MG tablet Take 2 mg by mouth daily as needed  for diarrhea or loose stools.   magnesium hydroxide (MILK OF MAGNESIA) 400 MG/5ML suspension Take 30 mLs by mouth daily as needed for mild constipation.   metoCLOPramide (REGLAN) 5 MG tablet Take 5 mg by mouth every 4 (four) hours as needed for nausea. If ondansetron does not work, give reglan.   metoprolol tartrate (LOPRESSOR) 25 MG tablet Take 6.25 mg by mouth 2 (two) times daily.   nitroGLYCERIN (NITROSTAT) 0.4 MG SL tablet Place 1 tablet (0.4 mg total) under the tongue every 5 (five) minutes as needed for chest pain.   nystatin cream (MYCOSTATIN) Apply 1 application topically 2 (two) times daily.   omeprazole (PRILOSEC) 40 MG capsule Take 40 mg by mouth daily.   ondansetron (ZOFRAN) 4 MG tablet Take 1 tablet (  4 mg total) by mouth every 6 (six) hours as needed for nausea. (Patient not taking: Reported on 03/24/2020)   OXYGEN Inhale 2 L/min into the lungs continuous.    potassium chloride SA (KLOR-CON) 20 MEQ tablet Take 40 mEq by mouth daily.   predniSONE (DELTASONE) 20 MG tablet Take 2 tablets (40 mg total) by mouth daily for 5 days.   tiZANidine (ZANAFLEX) 4 MG tablet Take 4 mg by mouth daily.   traMADol (ULTRAM) 50 MG tablet Take 50 mg by mouth every 8 (eight) hours.   No facility-administered encounter medications on file as of 03/30/2020.    Objective: ROS/ staff report  General: NAD Current and past weights:150 lbs, 10/21= 146 lbs ENMT: denies dysphagia Pulmonary: denies  cough, denies increased SOB, oxygen 4 L, denies edema Abdomen: endorses fair (25-50%) appetite,  denies constipation, endorses continence of bowel GU: denies dysuria, endorses continence of urine MSK:  endorses ROM limitations, no falls reported Skin: unstageable ulcers on bil heels  Neurological: endorses weakness, denies pain, denies insomnia Psych: Endorses positive mood   Thank you for the opportunity to participate in the care of Ms. Dobies.  The palliative care team will continue to follow.  Please call our office at [phone] if we can be of additional assistance.  Eliezer Lofts, NP , DNP, MPH, AGPCNP-BC, [facility]

## 2020-04-07 ENCOUNTER — Emergency Department: Payer: Medicare Other

## 2020-04-07 ENCOUNTER — Other Ambulatory Visit: Payer: Self-pay

## 2020-04-07 ENCOUNTER — Inpatient Hospital Stay
Admission: EM | Admit: 2020-04-07 | Discharge: 2020-04-10 | DRG: 871 | Disposition: A | Discharge status: Skilled Nursing Facility | Payer: Medicare Other | Attending: Family Medicine | Admitting: Family Medicine

## 2020-04-07 DIAGNOSIS — R079 Chest pain, unspecified: Secondary | ICD-10-CM

## 2020-04-07 DIAGNOSIS — Z681 Body mass index (BMI) 19 or less, adult: Secondary | ICD-10-CM

## 2020-04-07 DIAGNOSIS — R911 Solitary pulmonary nodule: Secondary | ICD-10-CM | POA: Diagnosis present

## 2020-04-07 DIAGNOSIS — Z8701 Personal history of pneumonia (recurrent): Secondary | ICD-10-CM

## 2020-04-07 DIAGNOSIS — A419 Sepsis, unspecified organism: Principal | ICD-10-CM | POA: Diagnosis present

## 2020-04-07 DIAGNOSIS — Z888 Allergy status to other drugs, medicaments and biological substances status: Secondary | ICD-10-CM

## 2020-04-07 DIAGNOSIS — S42123A Displaced fracture of acromial process, unspecified shoulder, initial encounter for closed fracture: Secondary | ICD-10-CM

## 2020-04-07 DIAGNOSIS — L8990 Pressure ulcer of unspecified site, unspecified stage: Secondary | ICD-10-CM | POA: Insufficient documentation

## 2020-04-07 DIAGNOSIS — E43 Unspecified severe protein-calorie malnutrition: Secondary | ICD-10-CM | POA: Diagnosis present

## 2020-04-07 DIAGNOSIS — D649 Anemia, unspecified: Secondary | ICD-10-CM | POA: Diagnosis present

## 2020-04-07 DIAGNOSIS — I5032 Chronic diastolic (congestive) heart failure: Secondary | ICD-10-CM | POA: Diagnosis present

## 2020-04-07 DIAGNOSIS — Z79899 Other long term (current) drug therapy: Secondary | ICD-10-CM

## 2020-04-07 DIAGNOSIS — K819 Cholecystitis, unspecified: Secondary | ICD-10-CM

## 2020-04-07 DIAGNOSIS — L89610 Pressure ulcer of right heel, unstageable: Secondary | ICD-10-CM | POA: Diagnosis present

## 2020-04-07 DIAGNOSIS — I110 Hypertensive heart disease with heart failure: Secondary | ICD-10-CM | POA: Diagnosis present

## 2020-04-07 DIAGNOSIS — M810 Age-related osteoporosis without current pathological fracture: Secondary | ICD-10-CM | POA: Diagnosis present

## 2020-04-07 DIAGNOSIS — R109 Unspecified abdominal pain: Secondary | ICD-10-CM

## 2020-04-07 DIAGNOSIS — K869 Disease of pancreas, unspecified: Secondary | ICD-10-CM | POA: Diagnosis present

## 2020-04-07 DIAGNOSIS — Z882 Allergy status to sulfonamides status: Secondary | ICD-10-CM

## 2020-04-07 DIAGNOSIS — Z7983 Long term (current) use of bisphosphonates: Secondary | ICD-10-CM

## 2020-04-07 DIAGNOSIS — R7989 Other specified abnormal findings of blood chemistry: Secondary | ICD-10-CM

## 2020-04-07 DIAGNOSIS — M7501 Adhesive capsulitis of right shoulder: Secondary | ICD-10-CM | POA: Diagnosis present

## 2020-04-07 DIAGNOSIS — Z9049 Acquired absence of other specified parts of digestive tract: Secondary | ICD-10-CM

## 2020-04-07 DIAGNOSIS — R64 Cachexia: Secondary | ICD-10-CM | POA: Diagnosis present

## 2020-04-07 DIAGNOSIS — Z1389 Encounter for screening for other disorder: Secondary | ICD-10-CM

## 2020-04-07 DIAGNOSIS — J440 Chronic obstructive pulmonary disease with acute lower respiratory infection: Secondary | ICD-10-CM | POA: Diagnosis present

## 2020-04-07 DIAGNOSIS — H913 Deaf nonspeaking, not elsewhere classified: Secondary | ICD-10-CM | POA: Diagnosis present

## 2020-04-07 DIAGNOSIS — I480 Paroxysmal atrial fibrillation: Secondary | ICD-10-CM | POA: Diagnosis present

## 2020-04-07 DIAGNOSIS — K219 Gastro-esophageal reflux disease without esophagitis: Secondary | ICD-10-CM | POA: Diagnosis present

## 2020-04-07 DIAGNOSIS — F32A Depression, unspecified: Secondary | ICD-10-CM | POA: Diagnosis present

## 2020-04-07 DIAGNOSIS — Z20822 Contact with and (suspected) exposure to covid-19: Secondary | ICD-10-CM | POA: Diagnosis present

## 2020-04-07 DIAGNOSIS — Z885 Allergy status to narcotic agent status: Secondary | ICD-10-CM

## 2020-04-07 DIAGNOSIS — Z8616 Personal history of COVID-19: Secondary | ICD-10-CM

## 2020-04-07 DIAGNOSIS — Z9981 Dependence on supplemental oxygen: Secondary | ICD-10-CM

## 2020-04-07 DIAGNOSIS — R4701 Aphasia: Secondary | ICD-10-CM

## 2020-04-07 DIAGNOSIS — M869 Osteomyelitis, unspecified: Secondary | ICD-10-CM

## 2020-04-07 DIAGNOSIS — E876 Hypokalemia: Secondary | ICD-10-CM | POA: Diagnosis not present

## 2020-04-07 DIAGNOSIS — L97419 Non-pressure chronic ulcer of right heel and midfoot with unspecified severity: Secondary | ICD-10-CM | POA: Diagnosis present

## 2020-04-07 DIAGNOSIS — H9193 Unspecified hearing loss, bilateral: Secondary | ICD-10-CM

## 2020-04-07 DIAGNOSIS — I252 Old myocardial infarction: Secondary | ICD-10-CM

## 2020-04-07 DIAGNOSIS — Z7902 Long term (current) use of antithrombotics/antiplatelets: Secondary | ICD-10-CM

## 2020-04-07 DIAGNOSIS — Z7982 Long term (current) use of aspirin: Secondary | ICD-10-CM

## 2020-04-07 DIAGNOSIS — R1010 Upper abdominal pain, unspecified: Secondary | ICD-10-CM

## 2020-04-07 DIAGNOSIS — J189 Pneumonia, unspecified organism: Secondary | ICD-10-CM | POA: Diagnosis present

## 2020-04-07 LAB — COMPREHENSIVE METABOLIC PANEL
ALT: 133 U/L — ABNORMAL HIGH (ref 0–44)
AST: 141 U/L — ABNORMAL HIGH (ref 15–41)
Albumin: 2.7 g/dL — ABNORMAL LOW (ref 3.5–5.0)
Alkaline Phosphatase: 396 U/L — ABNORMAL HIGH (ref 38–126)
Anion gap: 13 (ref 5–15)
BUN: 15 mg/dL (ref 8–23)
CO2: 26 mmol/L (ref 22–32)
Calcium: 8.3 mg/dL — ABNORMAL LOW (ref 8.9–10.3)
Chloride: 95 mmol/L — ABNORMAL LOW (ref 98–111)
Creatinine, Ser: 0.47 mg/dL (ref 0.44–1.00)
GFR, Estimated: >60 mL/min (ref >60)
Glucose, Bld: 98 mg/dL (ref 70–99)
Potassium: 3.6 mmol/L (ref 3.5–5.1)
Sodium: 134 mmol/L — ABNORMAL LOW (ref 135–145)
Total Bilirubin: 0.9 mg/dL (ref 0.3–1.2)
Total Protein: 6.4 g/dL — ABNORMAL LOW (ref 6.5–8.1)

## 2020-04-07 LAB — CBC WITH DIFFERENTIAL/PLATELET
Abs Immature Granulocytes: 0.05 10*3/uL (ref 0.00–0.07)
Basophils Absolute: 0 10*3/uL (ref 0.0–0.1)
Basophils Relative: 0 %
Eosinophils Absolute: 0.2 10*3/uL (ref 0.0–0.5)
Eosinophils Relative: 1 %
HCT: 31.3 % — ABNORMAL LOW (ref 36.0–46.0)
Hemoglobin: 9.8 g/dL — ABNORMAL LOW (ref 12.0–15.0)
Immature Granulocytes: 0 %
Lymphocytes Relative: 13 %
Lymphs Abs: 1.7 10*3/uL (ref 0.7–4.0)
MCH: 27.5 pg (ref 26.0–34.0)
MCHC: 31.3 g/dL (ref 30.0–36.0)
MCV: 87.7 fL (ref 80.0–100.0)
Monocytes Absolute: 0.6 10*3/uL (ref 0.1–1.0)
Monocytes Relative: 5 %
Neutro Abs: 10.8 10*3/uL — ABNORMAL HIGH (ref 1.7–7.7)
Neutrophils Relative %: 81 %
Platelets: 320 10*3/uL (ref 150–400)
RBC: 3.57 MIL/uL — ABNORMAL LOW (ref 3.87–5.11)
RDW: 17.8 % — ABNORMAL HIGH (ref 11.5–15.5)
WBC: 13.3 10*3/uL — ABNORMAL HIGH (ref 4.0–10.5)
nRBC: 0 % (ref 0.0–0.2)

## 2020-04-07 LAB — TROPONIN I (HIGH SENSITIVITY): Troponin I (High Sensitivity): 26 ng/L — ABNORMAL HIGH (ref <18)

## 2020-04-07 LAB — LIPASE, BLOOD: Lipase: 17 U/L (ref 11–51)

## 2020-04-07 MED ORDER — IOHEXOL 350 MG/ML SOLN
100.0000 mL | Freq: Once | INTRAVENOUS | Status: AC | PRN
  Administered 2020-04-07: 100 mL via INTRAVENOUS

## 2020-04-07 MED ORDER — IPRATROPIUM-ALBUTEROL 0.5-2.5 (3) MG/3ML IN SOLN
3.0000 mL | Freq: Once | RESPIRATORY_TRACT | Status: AC
  Administered 2020-04-07: 3 mL via RESPIRATORY_TRACT
  Filled 2020-04-07: qty 3

## 2020-04-07 NOTE — ED Triage Notes (Signed)
Patient coming [facility] from [facility] for 'funny feeling in chest, and feeling hot'. Patient's labs were drawn, and her troponin was 22. Patient mute and deaf.   Patient had chest xray performed today, results sent; bibasilar pneumonia.   Patient on 4L at baseline.   Patient 89% on 4L.

## 2020-04-07 NOTE — ED Provider Notes (Signed)
-----------------------------------------   10:57 PM on 04/07/2020 -----------------------------------------  Assuming care from Dr. Larinda Buttery.  In short, Alice Wallace is a 79 y.o. female with a chief complaint of chest and abdominal pain.  Refer to the original H&P for additional details.  The current plan of care is to follow up CTA chest and CT abdomen.  Reassess.    ----------------------------------------- 2:26 AM on 04/08/2020 -----------------------------------------  .Critical Care Performed by: Loleta Rose, MD Authorized by: Loleta Rose, MD   Critical care provider statement:    Critical care time (minutes):  30   Critical care time was exclusive of:  Separately billable procedures and treating other patients   Critical care was necessary to treat or prevent imminent or life-threatening deterioration of the following conditions:  Sepsis   Critical care was time spent personally by me on the following activities:  Development of treatment plan with patient or surrogate, discussions with consultants, evaluation of patient's response to treatment, examination of patient, obtaining history from patient or surrogate, ordering and performing treatments and interventions, ordering and review of laboratory studies, ordering and review of radiographic studies, pulse oximetry, re-evaluation of patient's condition and review of old charts   The patient meets sepsis criteria with a heart rate consistently greater than 100 and a white blood cell count greater than 13.  CT scans reveal right lower lobe pneumonia, no obvious acute abnormality in the abdomen and pelvis although she has a dilated common bile duct status post cholecystectomy.  I reassessed the patient with physical exam as well as asking written yes or no questions.  She reports persistent abdominal pain, persistent chest pain, and worsening shortness of breath compared to baseline.  We have been able to turn down her oxygen to her  baseline 4 L and she is maintaining her oxygenation.  Given her recent hospitalization (less than 3 weeks ago) now with right lower lobe pneumonia, meeting sepsis criteria, transaminitis, and persistent abdominal and chest pain.,  I will admit her to the hospital for further management.  I called code sepsis and ordered cefepime 2 g IV and vancomycin per pharmacy consult given the healthcare associated nature of the pneumonia.  The patient is not tolerating and out catheterizations so we are still awaiting urinalysis but will not hold on antibiotics.  Her lactic acid is normal and she does not meet criteria for severe sepsis so I ordered 1 L lactated Ringer's rather than 30 mils per kilo of IV fluid.  Given her persistent abdominal pain and biliary ductal dilatation in the setting of prior cholecystectomy and a normal lipase, I also ordered an MRCP to evaluate for the possibility of choledocholithiasis.  There is no indication to wait on admission for this finding since she will need to be hospitalized regardless, but I will inform the hospitalist of this plan as well.   ----------------------------------------- 2:53 AM on 04/08/2020 -----------------------------------------  Discussed case by phone with Dr. Arville Care.  He agrees with the plan (including the MRCP upon admission) and will admit the patient.    Final diagnoses:  Chest pain, unspecified type  Abdominal pain, unspecified abdominal location  Sepsis, due to unspecified organism, unspecified whether acute organ dysfunction present [facility])  Pneumonia of right lower lobe due to infectious organism  Upper abdominal pain  Elevated LFTs  Pulmonary nodule  Bilateral deafness  Huston Foley, Kandee Keen, MD 04/08/20 [phone]

## 2020-04-07 NOTE — ED Provider Notes (Signed)
[facility] Emergency Department Provider Note   ____________________________________________   First MD Initiated Contact with Patient 04/07/20 2139     (approximate)  I have reviewed the triage vital signs and the nursing notes.   HISTORY  Chief Complaint Pneumonia    HPI Alice Wallace is a 79 y.o. 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 History is extremely limited due to patient being deaf and mute, all history obtained through writing, which patient seems to have a difficult time understanding. Patient reportedly complained of a "funny feeling in chest" to staff at [facility]. CXR performed prior to arrival was concerning for pneumonia and troponin noted to be 22. On arrival, patient complains of pain in her chest and abdomen, denies cough or shortness of breath.        Past Medical History:  Diagnosis Date  . COPD (chronic obstructive pulmonary disease) ([place])   . Deaf   . Hypertension   . Osteoporosis     Patient Active Problem List   Diagnosis Date Noted  . Acute maculopapular rash 03/22/2020  . Gastroesophageal reflux disease with esophagitis 03/21/2020  . Pressure injury of left buttock, stage 2 ([place]) 03/21/2020  . Chronic respiratory failure with hypoxia ([place]) 03/21/2020  . Metabolic acidosis 35/70/1779  . Hyperkalemia 03/21/2020  . Chronic anemia 03/21/2020  . COPD with chronic bronchitis ([place]) 03/21/2020  . CAP (community acquired pneumonia) 03/21/2020  . Sepsis ([place]) 03/21/2020  . Malnutrition of moderate degree 02/03/2020  . Chronic diastolic CHF (congestive heart failure) ([place]) 02/03/2020  . Unstageable pressure ulcer of heel ([place]) 02/01/2020  . Nondisplaced fracture of base of neck of right femur, initial encounter for closed fracture ([place])   . Goals of care, counseling/discussion   . Palliative care by specialist   . Hip fracture, unspecified  laterality, closed, initial encounter ([place]) 01/26/2020  . Chronic obstructive pulmonary disease with acute exacerbation ([place]) 01/21/2020  . Non-STEMI (non-ST elevated myocardial infarction) ([place]) 01/21/2020  . Acute respiratory failure with hypoxia and hypercapnia (H[facility])   . Pneumonia due to COVID-19 virus 06/07/2019  . Hypokalemia 06/07/2019  . AKI (acute kidney injury) ([place]) 06/07/2019  . Deafness 06/07/2019  . COVID-19 virus infection 05/27/2019  . Essential hypertension 11/29/2018    Past Surgical History:  Procedure Laterality Date  . INTRAMEDULLARY (IM) NAIL INTERTROCHANTERIC Right 01/26/2020   Procedure: Right Hip IM nail with TFNA;  Surgeon: Lovell Sheehan, MD;  Location: [facility] ORS;  Service: Orthopedics;  Laterality: Right;    Prior to Admission medications   Medication Sig Start Date End Date Taking? Authorizing Provider  acetaminophen (TYLENOL) 325 MG tablet Take 2 tablets (650 mg total) by mouth every 6 (six) hours. Patient taking differently: Take 650 mg by mouth every 6 (six) hours. Not to exceed 4 grams / 24 hours 02/03/20   Nicole Kindred A, DO  acidophilus (RISAQUAD) CAPS capsule Take 1 capsule by mouth 3 (three) times daily. 02/03/20   Nicole Kindred A, DO  albuterol (PROVENTIL) (2.5 MG/3ML) 0.083% nebulizer solution Take 2.5 mg by nebulization every 6 (six) hours as needed for wheezing or shortness of breath. Patient not taking: Reported on 03/12/2020    [provider]  albuterol (VENTOLIN HFA) 108 (90 Base) MCG/ACT inhaler Inhale 2 puffs into the lungs every 4 (four) hours as needed for wheezing or shortness of breath.    [provider]  alendronate (FOSAMAX) 70  MG tablet Take 70 mg by mouth once a week. Take with a full glass of water on an empty stomach.    [provider]  aluminum-magnesium hydroxide-simethicone (MAALOX) 200-200-20 MG/5ML SUSP Take 30 mLs by mouth 4 (four) times daily -  before meals and at bedtime.    [provider]  Amino Acids-Protein Hydrolys (FEEDING SUPPLEMENT, PRO-STAT SUGAR FREE 64,) LIQD Take 30 mLs by mouth 2 (two) times daily.    [provider]  ascorbic acid (VITAMIN C) 500 MG tablet Take 500 mg by mouth daily.    [provider]  aspirin EC 81 MG tablet Take 81 mg by mouth daily. Swallow whole.    [provider]  atorvastatin (LIPITOR) 80 MG tablet Take 1 tablet (80 mg total) by mouth daily. 02/04/20   Ezekiel Slocumb, DO  Calcium Carbonate-Vitamin D3 (CALCIUM 600-D) 600-400 MG-UNIT TABS Take 1 tablet by mouth 2 (two) times daily.    [provider]  cetirizine (ZYRTEC) 10 MG tablet Take 10 mg by mouth daily.    [provider]  Cholecalciferol (VITAMIN D) 125 MCG (5000 UT) CAPS Take 5,000 Units by mouth daily.    [provider]  clopidogrel (PLAVIX) 75 MG tablet Take 1 tablet (75 mg total) by mouth daily. 02/04/20   Nicole Kindred A, DO  diclofenac Sodium (VOLTAREN) 1 % GEL Apply 1 application topically 4 (four) times daily. 01/02/20   [provider]  diltiazem (CARDIZEM CD) 120 MG 24 hr capsule Take 1 capsule (120 mg total) by mouth daily. 02/04/20   Ezekiel Slocumb, DO  diphenhydrAMINE (BENADRYL) 25 MG tablet Take 25 mg by mouth every 6 (six) hours as needed for allergies.     [provider]  docusate sodium (COLACE) 100 MG capsule Take 1 capsule (100 mg total) by mouth 2 (two) times daily. 02/03/20   Nicole Kindred A, DO  escitalopram (LEXAPRO) 10 MG tablet Take 10 mg by mouth at bedtime. 06/08/16   [provider]  feeding supplement, ENSURE ENLIVE, (ENSURE ENLIVE) LIQD Take 237 mLs by mouth 3 (three) times daily between meals. 06/10/19   Fritzi Mandes, MD  ferrous sulfate 300 (60 Fe) MG/5ML syrup Take 300 mg by mouth in the morning and at bedtime.     [provider]  Fluticasone-Salmeterol (ADVAIR) 100-50 MCG/DOSE AEPB Inhale 1 puff into the lungs 2 (two) times daily.    [provider]    furosemide (LASIX) 20 MG tablet Take 20 mg by mouth daily.    [provider]  guaifenesin (ROBITUSSIN) 100 MG/5ML syrup Take 300 mg by mouth 4 (four) times daily as needed for cough.    [provider]  loperamide (IMODIUM A-D) 2 MG tablet Take 2 mg by mouth daily as needed for diarrhea or loose stools.    [provider]  magnesium hydroxide (MILK OF MAGNESIA) 400 MG/5ML suspension Take 30 mLs by mouth daily as needed for mild constipation.    [provider]  metoCLOPramide (REGLAN) 5 MG tablet Take 5 mg by mouth every 4 (four) hours as needed for nausea. If ondansetron does not work, give reglan.    [provider]  metoprolol tartrate (LOPRESSOR) 25 MG tablet Take 6.25 mg by mouth 2 (two) times daily.    [provider]  nitroGLYCERIN (NITROSTAT) 0.4 MG SL tablet Place 1 tablet (0.4 mg total) under the tongue every 5 (five) minutes as needed for chest pain. 02/03/20   Arbutus Ped,  Kelly A, DO  nystatin cream (MYCOSTATIN) Apply 1 application topically 2 (two) times daily. 03/16/20   [provider]  omeprazole (PRILOSEC) 40 MG capsule Take 40 mg by mouth daily. 03/04/20   [provider]  ondansetron (ZOFRAN) 4 MG tablet Take 1 tablet (4 mg total) by mouth every 6 (six) hours as needed for nausea. Patient not taking: Reported on 03/24/2020 02/03/20   Nicole Kindred A, DO  OXYGEN Inhale 2 L/min into the lungs continuous.     [provider]  potassium chloride SA (KLOR-CON) 20 MEQ tablet Take 40 mEq by mouth daily.    [provider]  tiZANidine (ZANAFLEX) 4 MG tablet Take 4 mg by mouth daily.    [provider]  traMADol (ULTRAM) 50 MG tablet Take 50 mg by mouth every 8 (eight) hours.    [provider]    Allergies Hydrocodone, Naproxen, Other, Sulfamethoxazole-trimethoprim, and Tramadol  No family history on file.  Social History Social History   Tobacco Use  . Smoking status:  Never Smoker  . Smokeless tobacco: Never Used  Substance Use Topics  . Alcohol use: Not Currently  . Drug use: Not Currently    Review of Systems  Constitutional: No fever/chills Eyes: No visual changes. ENT: No sore throat. Cardiovascular: Positive for chest pain. Respiratory: Denies shortness of breath. Gastrointestinal: Positive for abdominal pain.  No nausea, no vomiting.  No diarrhea.  No constipation. Genitourinary: Negative for dysuria. Musculoskeletal: Negative for back pain. Skin: Negative for rash. Neurological: Negative for headaches, focal weakness or numbness.  ____________________________________________   PHYSICAL EXAM:  VITAL SIGNS: ED Triage Vitals  Enc Vitals Group     BP 04/07/20 2138 106/64     Pulse Rate 04/07/20 2138 (!) 114     Resp 04/07/20 2138 18     Temp 04/07/20 2138 98 F (36.7 C)     Temp src --      SpO2 04/07/20 2138 (!) 89 %     Weight 04/07/20 2139 108 lb 0.4 oz (49 kg)     Height --      Head Circumference --      [facility] --      Pain Score --      Pain Loc --      Pain Edu? --      Excl. in [place]? --     Constitutional: Alert and oriented. Eyes: Conjunctivae are normal. Head: Atraumatic. Nose: No congestion/rhinnorhea. Mouth/Throat: Mucous membranes are moist. Neck: Normal ROM Cardiovascular: Tachycardic, regular rhythm. Grossly normal heart sounds. Respiratory: Normal respiratory effort.  No retractions. Mild expiratory wheezing noted. Gastrointestinal: Soft and diffusely tender to palpation with no rebound or guarding. No distention. Genitourinary: deferred Musculoskeletal: No lower extremity tenderness nor edema. Neurologic:  Normal speech and language. No gross focal neurologic deficits are appreciated. Skin:  Skin is warm, dry and intact. No rash noted. Psychiatric: Mood and affect are normal. Speech and behavior are normal.  ____________________________________________   LABS (all labs ordered are listed, but only  abnormal results are displayed)  Labs Reviewed  CBC WITH DIFFERENTIAL/PLATELET - Abnormal; Notable for the following components:      Result Value   WBC 13.3 (*)    RBC 3.57 (*)    Hemoglobin 9.8 (*)    HCT 31.3 (*)    RDW 17.8 (*)    Neutro Abs 10.8 (*)    All other components within normal limits  COMPREHENSIVE METABOLIC PANEL - Abnormal; Notable for  the following components:   Sodium 134 (*)    Chloride 95 (*)    Calcium 8.3 (*)    Total Protein 6.4 (*)    Albumin 2.7 (*)    AST 141 (*)    ALT 133 (*)    Alkaline Phosphatase 396 (*)    All other components within normal limits  TROPONIN I (HIGH SENSITIVITY) - Abnormal; Notable for the following components:   Troponin I (High Sensitivity) 26 (*)    All other components within normal limits  RESPIRATORY PANEL BY RT PCR (FLU A&B, COVID)  CULTURE, BLOOD (ROUTINE X 2)  CULTURE, BLOOD (ROUTINE X 2)  LIPASE, BLOOD  URINALYSIS, COMPLETE (UACMP) WITH MICROSCOPIC  LACTIC ACID, PLASMA  LACTIC ACID, PLASMA  TROPONIN I (HIGH SENSITIVITY)   ____________________________________________  EKG  ED ECG REPORT I, Blake Divine, the attending physician, personally viewed and interpreted this ECG.   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 Initial oxygen sat of 89% on her usual 4L, however mild wheezing was treated with duoneb and she was able to be weaned from 6L back down to 4L. She currently denies any shortness of breath but we will further assess chest pain  and abdominal pain with CTA chest as well as CT abdomen/pelvis. Labs thus far showing transaminitis with elevated alk phos but normal bilirubin. Troponin stable compared to previous with no ischemic changes on ekg, low suspicion for ACS. Patient turned over to oncoming provider pending CT results.      ____________________________________________   FINAL CLINICAL IMPRESSION(S) / ED DIAGNOSES  Final diagnoses:  Chest pain, unspecified type  Abdominal pain, unspecified abdominal location     ED Discharge Orders    None       Note:  This document was prepared using Dragon voice recognition software and may include unintentional dictation errors.   Blake Divine, MD 04/08/20 [phone]

## 2020-04-08 ENCOUNTER — Emergency Department: Payer: Medicare Other

## 2020-04-08 ENCOUNTER — Inpatient Hospital Stay: Payer: Medicare Other

## 2020-04-08 DIAGNOSIS — R7989 Other specified abnormal findings of blood chemistry: Secondary | ICD-10-CM | POA: Diagnosis not present

## 2020-04-08 DIAGNOSIS — J440 Chronic obstructive pulmonary disease with acute lower respiratory infection: Secondary | ICD-10-CM | POA: Diagnosis present

## 2020-04-08 DIAGNOSIS — D649 Anemia, unspecified: Secondary | ICD-10-CM | POA: Diagnosis present

## 2020-04-08 DIAGNOSIS — Z20822 Contact with and (suspected) exposure to covid-19: Secondary | ICD-10-CM | POA: Diagnosis present

## 2020-04-08 DIAGNOSIS — E43 Unspecified severe protein-calorie malnutrition: Secondary | ICD-10-CM | POA: Diagnosis present

## 2020-04-08 DIAGNOSIS — E876 Hypokalemia: Secondary | ICD-10-CM | POA: Diagnosis not present

## 2020-04-08 DIAGNOSIS — A419 Sepsis, unspecified organism: Secondary | ICD-10-CM | POA: Diagnosis present

## 2020-04-08 DIAGNOSIS — F32A Depression, unspecified: Secondary | ICD-10-CM | POA: Diagnosis present

## 2020-04-08 DIAGNOSIS — K219 Gastro-esophageal reflux disease without esophagitis: Secondary | ICD-10-CM | POA: Diagnosis present

## 2020-04-08 DIAGNOSIS — Z8616 Personal history of COVID-19: Secondary | ICD-10-CM | POA: Diagnosis not present

## 2020-04-08 DIAGNOSIS — L89610 Pressure ulcer of right heel, unstageable: Secondary | ICD-10-CM | POA: Diagnosis present

## 2020-04-08 DIAGNOSIS — K869 Disease of pancreas, unspecified: Secondary | ICD-10-CM

## 2020-04-08 DIAGNOSIS — I252 Old myocardial infarction: Secondary | ICD-10-CM | POA: Diagnosis not present

## 2020-04-08 DIAGNOSIS — K838 Other specified diseases of biliary tract: Secondary | ICD-10-CM | POA: Diagnosis not present

## 2020-04-08 DIAGNOSIS — Z681 Body mass index (BMI) 19 or less, adult: Secondary | ICD-10-CM | POA: Diagnosis not present

## 2020-04-08 DIAGNOSIS — I110 Hypertensive heart disease with heart failure: Secondary | ICD-10-CM | POA: Diagnosis present

## 2020-04-08 DIAGNOSIS — I5032 Chronic diastolic (congestive) heart failure: Secondary | ICD-10-CM | POA: Diagnosis present

## 2020-04-08 DIAGNOSIS — I480 Paroxysmal atrial fibrillation: Secondary | ICD-10-CM | POA: Diagnosis present

## 2020-04-08 DIAGNOSIS — R64 Cachexia: Secondary | ICD-10-CM | POA: Diagnosis present

## 2020-04-08 DIAGNOSIS — H913 Deaf nonspeaking, not elsewhere classified: Secondary | ICD-10-CM | POA: Diagnosis present

## 2020-04-08 DIAGNOSIS — R109 Unspecified abdominal pain: Secondary | ICD-10-CM | POA: Diagnosis not present

## 2020-04-08 DIAGNOSIS — R059 Cough, unspecified: Secondary | ICD-10-CM | POA: Diagnosis not present

## 2020-04-08 DIAGNOSIS — R911 Solitary pulmonary nodule: Secondary | ICD-10-CM | POA: Diagnosis present

## 2020-04-08 DIAGNOSIS — L97419 Non-pressure chronic ulcer of right heel and midfoot with unspecified severity: Secondary | ICD-10-CM | POA: Diagnosis present

## 2020-04-08 DIAGNOSIS — M810 Age-related osteoporosis without current pathological fracture: Secondary | ICD-10-CM | POA: Diagnosis present

## 2020-04-08 DIAGNOSIS — J9621 Acute and chronic respiratory failure with hypoxia: Secondary | ICD-10-CM

## 2020-04-08 DIAGNOSIS — R079 Chest pain, unspecified: Secondary | ICD-10-CM | POA: Diagnosis not present

## 2020-04-08 DIAGNOSIS — H9193 Unspecified hearing loss, bilateral: Secondary | ICD-10-CM | POA: Diagnosis present

## 2020-04-08 DIAGNOSIS — K819 Cholecystitis, unspecified: Secondary | ICD-10-CM | POA: Diagnosis present

## 2020-04-08 DIAGNOSIS — M7501 Adhesive capsulitis of right shoulder: Secondary | ICD-10-CM | POA: Diagnosis present

## 2020-04-08 DIAGNOSIS — J189 Pneumonia, unspecified organism: Secondary | ICD-10-CM | POA: Diagnosis present

## 2020-04-08 LAB — CBC
HCT: 28.2 % — ABNORMAL LOW (ref 36.0–46.0)
Hemoglobin: 8.8 g/dL — ABNORMAL LOW (ref 12.0–15.0)
MCH: 27.7 pg (ref 26.0–34.0)
MCHC: 31.2 g/dL (ref 30.0–36.0)
MCV: 88.7 fL (ref 80.0–100.0)
Platelets: 231 10*3/uL (ref 150–400)
RBC: 3.18 MIL/uL — ABNORMAL LOW (ref 3.87–5.11)
RDW: 17.9 % — ABNORMAL HIGH (ref 11.5–15.5)
WBC: 10.1 10*3/uL (ref 4.0–10.5)
nRBC: 0 % (ref 0.0–0.2)

## 2020-04-08 LAB — PROCALCITONIN: Procalcitonin: 0.43 ng/mL

## 2020-04-08 LAB — RESPIRATORY PANEL BY RT PCR (FLU A&B, COVID)
Influenza A by PCR: NEGATIVE
Influenza B by PCR: NEGATIVE
SARS Coronavirus 2 by RT PCR: NEGATIVE

## 2020-04-08 LAB — BASIC METABOLIC PANEL
Anion gap: 11 (ref 5–15)
BUN: 14 mg/dL (ref 8–23)
CO2: 27 mmol/L (ref 22–32)
Calcium: 8.6 mg/dL — ABNORMAL LOW (ref 8.9–10.3)
Chloride: 98 mmol/L (ref 98–111)
Creatinine, Ser: 0.49 mg/dL (ref 0.44–1.00)
GFR, Estimated: >60 mL/min (ref >60)
Glucose, Bld: 100 mg/dL — ABNORMAL HIGH (ref 70–99)
Potassium: 4 mmol/L (ref 3.5–5.1)
Sodium: 136 mmol/L (ref 135–145)

## 2020-04-08 LAB — MRSA PCR SCREENING: MRSA by PCR: POSITIVE — AB

## 2020-04-08 LAB — PROTIME-INR
INR: 1.1 (ref 0.8–1.2)
Prothrombin Time: 13.7 seconds (ref 11.4–15.2)

## 2020-04-08 LAB — LACTIC ACID, PLASMA: Lactic Acid, Venous: 1.1 mmol/L (ref 0.5–1.9)

## 2020-04-08 LAB — TROPONIN I (HIGH SENSITIVITY): Troponin I (High Sensitivity): 26 ng/L — ABNORMAL HIGH (ref <18)

## 2020-04-08 LAB — CORTISOL-AM, BLOOD: Cortisol - AM: 22.6 ug/dL (ref 6.7–22.6)

## 2020-04-08 MED ORDER — IPRATROPIUM-ALBUTEROL 0.5-2.5 (3) MG/3ML IN SOLN
3.0000 mL | Freq: Four times a day (QID) | RESPIRATORY_TRACT | Status: DC
  Administered 2020-04-08 – 2020-04-09 (×4): 3 mL via RESPIRATORY_TRACT
  Filled 2020-04-08 (×4): qty 3

## 2020-04-08 MED ORDER — ONDANSETRON HCL 4 MG/2ML IJ SOLN: 4.0000 mg | Freq: Four times a day (QID) | INTRAMUSCULAR | Status: DC | PRN

## 2020-04-08 MED ORDER — POTASSIUM CHLORIDE 20 MEQ PO PACK
40.0000 meq | PACK | Freq: Once | ORAL | Status: AC
  Administered 2020-04-08: 40 meq via ORAL
  Filled 2020-04-08: qty 2

## 2020-04-08 MED ORDER — VANCOMYCIN HCL IN DEXTROSE 1-5 GM/200ML-% IV SOLN
1000.0000 mg | Freq: Once | INTRAVENOUS | Status: DC
  Filled 2020-04-08: qty 200

## 2020-04-08 MED ORDER — MUPIROCIN 2 % EX OINT
1.0000 "application " | TOPICAL_OINTMENT | Freq: Two times a day (BID) | CUTANEOUS | Status: DC
  Administered 2020-04-08 – 2020-04-10 (×4): 1 via NASAL
  Filled 2020-04-08: qty 22

## 2020-04-08 MED ORDER — TRAZODONE HCL 50 MG PO TABS: 25.0000 mg | ORAL_TABLET | Freq: Every evening | ORAL | Status: DC | PRN

## 2020-04-08 MED ORDER — ACETAMINOPHEN 650 MG RE SUPP: 650.0000 mg | Freq: Four times a day (QID) | RECTAL | Status: DC | PRN

## 2020-04-08 MED ORDER — SODIUM CHLORIDE 0.9 % IV SOLN
500.0000 mg | INTRAVENOUS | Status: DC
  Administered 2020-04-08 – 2020-04-09 (×2): 500 mg via INTRAVENOUS
  Filled 2020-04-08 (×3): qty 500

## 2020-04-08 MED ORDER — GUAIFENESIN ER 600 MG PO TB12
600.0000 mg | ORAL_TABLET | Freq: Two times a day (BID) | ORAL | Status: DC
  Administered 2020-04-08 – 2020-04-10 (×5): 600 mg via ORAL
  Filled 2020-04-08 (×5): qty 1

## 2020-04-08 MED ORDER — CHLORHEXIDINE GLUCONATE CLOTH 2 % EX PADS
6.0000 | MEDICATED_PAD | Freq: Every day | CUTANEOUS | Status: DC
  Administered 2020-04-09: 6 via TOPICAL

## 2020-04-08 MED ORDER — SODIUM CHLORIDE 0.9 % IV SOLN: INTRAVENOUS | Status: DC

## 2020-04-08 MED ORDER — MAGNESIUM HYDROXIDE 400 MG/5ML PO SUSP: 30.0000 mL | Freq: Every day | ORAL | Status: DC | PRN

## 2020-04-08 MED ORDER — LACTATED RINGERS IV SOLN: INTRAVENOUS | Status: DC

## 2020-04-08 MED ORDER — SODIUM CHLORIDE 0.9 % IV SOLN
2.0000 g | Freq: Once | INTRAVENOUS | Status: AC
  Administered 2020-04-08: 2 g via INTRAVENOUS
  Filled 2020-04-08: qty 2

## 2020-04-08 MED ORDER — SODIUM CHLORIDE 0.9 % IV SOLN: 2.0000 g | INTRAVENOUS | Status: DC

## 2020-04-08 MED ORDER — SODIUM CHLORIDE 0.9 % IV SOLN
2.0000 g | INTRAVENOUS | Status: DC
  Administered 2020-04-08 – 2020-04-09 (×2): 2 g via INTRAVENOUS
  Filled 2020-04-08 (×2): qty 20
  Filled 2020-04-08: qty 2

## 2020-04-08 MED ORDER — ACETAMINOPHEN 325 MG PO TABS: 650.0000 mg | ORAL_TABLET | Freq: Four times a day (QID) | ORAL | Status: DC | PRN

## 2020-04-08 MED ORDER — LACTATED RINGERS IV BOLUS (SEPSIS)
1000.0000 mL | Freq: Once | INTRAVENOUS | Status: AC
  Administered 2020-04-08: 1000 mL via INTRAVENOUS

## 2020-04-08 MED ORDER — GADOBUTROL 1 MMOL/ML IV SOLN
5.0000 mL | Freq: Once | INTRAVENOUS | Status: AC | PRN
  Administered 2020-04-08: 5 mL via INTRAVENOUS

## 2020-04-08 MED ORDER — ONDANSETRON HCL 4 MG PO TABS: 4.0000 mg | ORAL_TABLET | Freq: Four times a day (QID) | ORAL | Status: DC | PRN

## 2020-04-08 MED ORDER — ENOXAPARIN SODIUM 40 MG/0.4ML ~~LOC~~ SOLN
40.0000 mg | SUBCUTANEOUS | Status: DC
  Administered 2020-04-08 – 2020-04-10 (×3): 40 mg via SUBCUTANEOUS
  Filled 2020-04-08 (×3): qty 0.4

## 2020-04-08 NOTE — Progress Notes (Signed)
Following for Code Sepsis  

## 2020-04-08 NOTE — Consult Note (Signed)
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  Right heel with unstageable pressure injury   Wound type:pressure and moisture.  Patient is contracted and frequently moist.   Pressure Injury POA: Yes has progressed Right heel not noted on admission.  Measurement:Right heel:  1 cm x 0.3 cm scabbed lesion Scattered 1 cm nonintact peeling epithelium  Wound bed:red and moist Drainage (amount, consistency, odor) minimal bleeding noted.  Periwound:moist Dressing procedure/placement/frequency: Cleanse wounds to buttock, heel and sacrum with NS and pat dry. Apply alginate dressing and cover with foam Change every three days and PRN soilage.  Will implement low air loss bed and heel boots.  Will not follow at this time.  Please re-consult if needed.  Maple Hudson MSN, RN, FNP-BC CWON Wound, Ostomy, Continence Nurse Pager [phone]

## 2020-04-08 NOTE — H&P (Signed)
[place]   PATIENT NAME: Alice Wallace    MR#:  [id number]  DATE OF BIRTH:  27-Jan-1941  DATE OF ADMISSION:  04/07/2020  PRIMARY CARE PHYSICIAN: [name], MD   REQUESTING/REFERRING PHYSICIAN: Hinda Kehr, MD  CHIEF COMPLAINT:   Chief Complaint  Patient presents with  . Pneumonia  . HISTORY OF PRESENT ILLNESS:  Alice Wallace  is a 79 y.o. Caucasian female with a known history of COPD, diastolic CHF, deafness and mutism, chronic anemia, hypertension osteoporosis, presented to the emergency room with acute onset of midline chest pain that felt like a funny feeling to her staff at [facility].  She had a chest x-ray that was performed prior to arrival to the hospital the was concerning for pneumonia.  Her troponin was 22.  The patient was also complaining of epigastric and right upper quadrant abdominal pain.  She has been having cough productive of clear to white sputum.  No reported fever or chills.  History was obtained through writing and and available records.  Due to her deafness and muteness it was fairly limited.  Upon presentation to the emergency room, heart rate was 114 with pulse oximetry of 89% on her 4 L home O2 and 95% on 5 L O2 by nasal cannula.  Labs revealed elevated alk phos of 396 AST of 141 ALT 133 and albumin of 2.7 with total protein 6.4 total bili of 0.9.  High-sensitivity troponin I was 26 and later the same.  Lactic acid level was 1.1.  CBC showed leukocytosis of 13.3 up from 7.5 on 03/25/2020 with anemia better than previous levels.  Influenza antigens and COVID-19 PCR came back negative.  Two-view chest x-ray showed COPD with bibasal scarring with no acute cardiopulmonary process.  CTA of the abdomen and pelvis as well as chest revealed no evidence for PE.  It showed severe COPD with stable 50 mm right upper lobe nodule in consolidation of the right lower lobe compatible with pneumonia, moderate-sized hiatal hernia and aortic  atherosclerosis as well as emphysema.  The patient was given IV vancomycin and cefepime as well as 1 L bolus of IV lactated Ringer followed 100 mL/h in addition to duo nebs.  She will be admitted to a progressive unit bed for further evaluation and management. PAST MEDICAL HISTORY:   Past Medical History:  Diagnosis Date  . COPD (chronic obstructive pulmonary disease) ([place])   . Deaf   . Hypertension   . Osteoporosis     PAST SURGICAL HISTORY:   Past Surgical History:  Procedure Laterality Date  . INTRAMEDULLARY (IM) NAIL INTERTROCHANTERIC Right 01/26/2020   Procedure: Right Hip IM nail with TFNA;  Surgeon: Lovell Sheehan, MD;  Location: [facility] ORS;  Service: Orthopedics;  Laterality: Right;    SOCIAL HISTORY:   Social History   Tobacco Use  . Smoking status: Never Smoker  . Smokeless tobacco: Never Used  Substance Use Topics  . Alcohol use: Not Currently    FAMILY HISTORY:  No family history on file.  No pertinent familial diseases.  DRUG ALLERGIES:   Allergies  Allergen Reactions  . Hydrocodone     Other reaction(s): Unknown  . Naproxen     Other reaction(s): Unknown  . Other Other (See Comments)    nuts  . Sulfamethoxazole-Trimethoprim     Other reaction(s): Unknown  . Tramadol     Other reaction(s): Unknown Pt has tolerated this med 1/5-1/7 with no issues  REVIEW OF SYSTEMS:   ROS As per history of present illness. All pertinent systems were reviewed above. Constitutional, HEENT, cardiovascular, respiratory, GI, GU, musculoskeletal, neuro, psychiatric, endocrine, integumentary and hematologic systems were reviewed and are otherwise negative/unremarkable except for positive findings mentioned above in the HPI.   MEDICATIONS AT HOME:   Prior to Admission medications   Medication Sig Start Date End Date Taking? Authorizing Provider  acetaminophen (TYLENOL) 325 MG tablet Take 2 tablets (650 mg total) by mouth every 6 (six) hours. Patient taking  differently: Take 650 mg by mouth every 6 (six) hours. Not to exceed 4 grams / 24 hours 02/03/20  Yes Nicole Kindred A, DO  acidophilus (RISAQUAD) CAPS capsule Take 1 capsule by mouth 3 (three) times daily. 02/03/20  Yes Nicole Kindred A, DO  albuterol (VENTOLIN HFA) 108 (90 Base) MCG/ACT inhaler Inhale 2 puffs into the lungs every 6 (six) hours as needed for wheezing or shortness of breath.    Yes [provider]  alendronate (FOSAMAX) 70 MG tablet Take 70 mg by mouth every Monday. Take with a full glass of water on an empty stomach.    Yes [provider]  aluminum-magnesium hydroxide-simethicone (MAALOX) 200-200-20 MG/5ML SUSP Take 30 mLs by mouth 4 (four) times daily -  before meals and at bedtime.   Yes [provider]  Amino Acids-Protein Hydrolys (FEEDING SUPPLEMENT, PRO-STAT SUGAR FREE 64,) LIQD Take 30 mLs by mouth 2 (two) times daily.   Yes [provider]  ascorbic acid (VITAMIN C) 500 MG tablet Take 500 mg by mouth daily.   Yes [provider]  aspirin EC 81 MG tablet Take 81 mg by mouth daily. Swallow whole.   Yes [provider]  atorvastatin (LIPITOR) 80 MG tablet Take 1 tablet (80 mg total) by mouth daily. 02/04/20  Yes Nicole Kindred A, DO  Calcium Carbonate-Vitamin D3 (CALCIUM 600-D) 600-400 MG-UNIT TABS Take 1 tablet by mouth 2 (two) times daily.   Yes [provider]  cetirizine (ZYRTEC) 10 MG tablet Take 10 mg by mouth daily.   Yes [provider]  Cholecalciferol (VITAMIN D) 125 MCG (5000 UT) CAPS Take 5,000 Units by mouth daily.   Yes [provider]  clopidogrel (PLAVIX) 75 MG tablet Take 1 tablet (75 mg total) by mouth daily. 02/04/20  Yes Nicole Kindred A, DO  diclofenac Sodium (VOLTAREN) 1 % GEL Apply 2 g topically 4 (four) times daily.  01/02/20  Yes [provider]  diltiazem (CARDIZEM CD) 120 MG 24 hr capsule Take 1 capsule (120 mg total) by mouth daily. 02/04/20  Yes Nicole Kindred A,  DO  diphenhydrAMINE (BENADRYL) 25 MG tablet Take 25 mg by mouth every 6 (six) hours as needed for allergies.    Yes [provider]  docusate sodium (COLACE) 100 MG capsule Take 1 capsule (100 mg total) by mouth 2 (two) times daily. 02/03/20  Yes Nicole Kindred A, DO  escitalopram (LEXAPRO) 10 MG tablet Take 10 mg by mouth at bedtime. 06/08/16  Yes [provider]  ferrous sulfate 300 (60 Fe) MG/5ML syrup Take 300 mg by mouth in the morning and at bedtime.    Yes [provider]  Fluticasone-Salmeterol (ADVAIR) 100-50 MCG/DOSE AEPB Inhale 1 puff into the lungs 2 (two) times daily.   Yes [provider]  furosemide (LASIX) 20 MG tablet Take 20 mg by mouth daily.   Yes [provider]  Ipratropium-Albuterol (COMBIVENT RESPIMAT) 20-100 MCG/ACT AERS respimat Inhale 1 puff into  the lungs every 6 (six) hours as needed for wheezing.   Yes [provider]  loperamide (IMODIUM A-D) 2 MG tablet Take 2 mg by mouth daily as needed for diarrhea or loose stools.   Yes [provider]  magnesium hydroxide (MILK OF MAGNESIA) 400 MG/5ML suspension Take 30 mLs by mouth daily as needed for mild constipation.   Yes [provider]  metoCLOPramide (REGLAN) 5 MG tablet Take 5 mg by mouth every 8 (eight) hours as needed for nausea. If ondansetron does not work, give reglan.    Yes [provider]  metoprolol tartrate (LOPRESSOR) 25 MG tablet Take 6.25 mg by mouth 2 (two) times daily.   Yes [provider]  nitroGLYCERIN (NITROSTAT) 0.4 MG SL tablet Place 1 tablet (0.4 mg total) under the tongue every 5 (five) minutes as needed for chest pain. 02/03/20  Yes Nicole Kindred A, DO  nystatin cream (MYCOSTATIN) Apply 1 application topically 2 (two) times daily. 03/16/20  Yes [provider]  omeprazole (PRILOSEC) 40 MG capsule Take 40 mg by mouth daily. 03/04/20  Yes [provider]  ondansetron (ZOFRAN) 4 MG tablet Take 1  tablet (4 mg total) by mouth every 6 (six) hours as needed for nausea. 02/03/20  Yes Nicole Kindred A, DO  potassium chloride SA (KLOR-CON) 20 MEQ tablet Take 40 mEq by mouth daily.   Yes [provider]  sodium chloride 0.9 % infusion Inject 500 mLs into the vein 3 (three) times daily. 60 ml/ hour   Yes [provider]  traMADol (ULTRAM) 50 MG tablet Take 50 mg by mouth every 8 (eight) hours.   Yes [provider]  feeding supplement, ENSURE ENLIVE, (ENSURE ENLIVE) LIQD Take 237 mLs by mouth 3 (three) times daily between meals. 06/10/19   Fritzi Mandes, MD  OXYGEN Inhale 2 L/min into the lungs continuous.     [provider]      VITAL SIGNS:  Blood pressure 100/60, pulse 97, temperature 98 F (36.7 C), resp. rate 18, weight 49 kg, SpO2 99 %.  PHYSICAL EXAMINATION:  Physical Exam  GENERAL:  79 y.o.-year-old Caucasian female patient lying in the bed with no acute distress.  EYES: Pupils equal, round, reactive to light and accommodation. No scleral icterus. Extraocular muscles intact.  HEENT: Head atraumatic, normocephalic. Oropharynx and nasopharynx clear.  NECK:  Supple, no jugular venous distention. No thyroid enlargement, no tenderness.  LUNGS: Normal breath sounds bilaterally, no wheezing, rales,rhonchi or crepitation. No use of accessory muscles of respiration.  CARDIOVASCULAR: Regular rate and rhythm, S1, S2 normal. No murmurs, rubs, or gallops.  ABDOMEN: Soft, nondistended with minimal epigastric and right upper quadrant tenderness without rebound tenderness guarding rigidity.  Bowel sounds present. No organomegaly or mass.  EXTREMITIES: No pedal edema, cyanosis, or clubbing.  NEUROLOGIC: Cranial nerves II through XII are intact. Muscle strength 5/5 in all extremities. Sensation intact. Gait not checked.  PSYCHIATRIC: The patient is alert and oriented x 3.  Normal affect and good eye contact. SKIN: No obvious rash, lesion, or ulcer.   LABORATORY  PANEL:   CBC Recent Labs  Lab 04/07/20 2140  WBC 13.3*  HGB 9.8*  HCT 31.3*  PLT 320   ------------------------------------------------------------------------------------------------------------------  Chemistries  Recent Labs  Lab 04/07/20 2140  NA 134*  K 3.6  CL 95*  CO2 26  GLUCOSE 98  BUN 15  CREATININE 0.47  CALCIUM 8.3*  AST 141*  ALT 133*  ALKPHOS 396*  BILITOT 0.9   ------------------------------------------------------------------------------------------------------------------  Cardiac Enzymes No results for input(s): TROPONINI in the last 168 hours. ------------------------------------------------------------------------------------------------------------------  RADIOLOGY:  DG Chest 2 View  Result Date: 04/07/2020 CLINICAL DATA:  Chest pain. EXAM: CHEST - 2 VIEW COMPARISON:  Radiographs 03/23/2020 and 03/22/2020. FINDINGS: The heart size and mediastinal contours are stable. There is diffuse aortic atherosclerosis. The lungs are hyperinflated with asymmetric bullous changes superiorly on the right and bibasilar scarring. No superimposed airspace disease, pleural effusion or pneumothorax identified. There are multiple chronic left-sided rib deformities. No acute osseous findings are seen. IMPRESSION: Chronic obstructive pulmonary disease with bibasilar scarring. No acute cardiopulmonary process. Electronically Signed   By: Richardean Sale M.D.   On: 04/07/2020 22:04   CT Angio Chest PE W/Cm &/Or Wo Cm  Result Date: 04/08/2020 CLINICAL DATA:  Funny feeling in chest. EXAM: CT ANGIOGRAPHY CHEST WITH CONTRAST TECHNIQUE: Multidetector CT imaging of the chest was performed using the standard protocol during bolus administration of intravenous contrast. Multiplanar CT image reconstructions and MIPs were obtained to evaluate the vascular anatomy. CONTRAST:  124m OMNIPAQUE IOHEXOL 350 MG/ML SOLN COMPARISON:  01/21/2020 FINDINGS: Cardiovascular: No filling defects in  the pulmonary arteries to suggest pulmonary emboli. Heart is normal size. Coronary artery and aortic calcifications. No dissection. Mediastinum/Nodes: No adenopathy in the mediastinum or axilla. Mildly prominent right hilar lymph nodes, likely reactive. Moderate-sized hiatal hernia. Lungs/Pleura: Severe COPD. 15 mm triangular nodule in the right upper lobe is stable since prior study. Consolidation in the right lower lobe compatible with pneumonia. No confluent opacity on the left. Upper Abdomen: Imaging into the upper abdomen demonstrates no acute findings. Musculoskeletal: Chest wall soft tissues are unremarkable. No acute bony abnormality. Review of the MIP images confirms the above findings. IMPRESSION: No evidence of pulmonary embolus. Severe COPD.  Stable 15 mm right upper lobe nodule. Consolidation in the right lower lobe compatible with pneumonia. Moderate-sized hiatal hernia. Aortic Atherosclerosis (ICD10-I70.0) and Emphysema (ICD10-J43.9). Electronically Signed   By: KRolm BaptiseM.D.   On: 04/08/2020 00:45   CT Abdomen Pelvis W Contrast  Result Date: 04/08/2020 CLINICAL DATA:  Funny feeling in chest. EXAM: CT ABDOMEN AND PELVIS WITH CONTRAST TECHNIQUE: Multidetector CT imaging of the abdomen and pelvis was performed using the standard protocol following bolus administration of intravenous contrast. CONTRAST:  1076mOMNIPAQUE IOHEXOL 350 MG/ML SOLN COMPARISON:  10/14/2012 FINDINGS: Lower chest: Area consolidation in the right lower lobe concerning for pneumonia. Moderate-sized hiatal hernia. Emphysema. Hepatobiliary: Prior cholecystectomy. Associated biliary ductal dilatation. No focal hepatic abnormality. Pancreas: No focal abnormality or ductal dilatation. Spleen: Scattered punctate calcifications compatible with old granulomatous disease. Normal size. Adrenals/Urinary Tract: No adrenal abnormality. No focal renal abnormality. No stones or hydronephrosis. Urinary bladder is unremarkable.  Stomach/Bowel: Large stool burden within the colon. Question fecal impaction within the rectum. No evidence of bowel obstruction. Vascular/Lymphatic: Heavily calcified aorta and iliac vessels. No evidence of aneurysm or adenopathy. Reproductive: Prior hysterectomy.  No adnexal masses. Other: No free fluid or free air. Musculoskeletal: No acute bony abnormality. IMPRESSION: No acute findings in the abdomen or pelvis. Large stool burden throughout the colon, question fecal impaction. Aortic atherosclerosis. Consolidation in the right lower lobe compatible with pneumonia. Electronically Signed   By: KeRolm Baptise.D.   On: 04/08/2020 00:47      IMPRESSION AND PLAN:   1.  Right lower lobe community-acquired pneumonia with subsequent sepsis (based on leukocytosis and tachycardia) without severe sepsis or septic shock and acute on chronic hypoxic respiratory failure with subsequent chest pain likely pleuritic.  The patient has improved after recent management pneumonia during her previous hospitalization 10/31-11/4.  I believe that this is recurrence rather than HCAP. -The patient will be admitted to a progressive unit bed. -We will continue medical therapy with IV Rocephin and Zithromax for now. -She will be hydrated with IV normal saline. -We will follow blood and sputum cultures. -The patient will be placed on mucolytic therapy and DuoNeb's.  2.  Elevated LFTs with associated abdominal pain status post recent cholecystectomy. -We will follow LFTs with hydration. -GI consultation will be obtained. -I notified Dr. Alice Reichert about the patient. -The patient's MRCP is currently pending to rule out choledocholithiasis especially given elevated alk phos.  3.  COPD without acute exacerbation. -We will continue her on scheduled and as needed duo nebs and hold off Advair Diskus and Trelegy.  4.  Depression. -Continue Lexapro.  5.  Anemia. -We will continue ferrous sulfate.  6.  Hypertension. -We will  continue beta-blocker therapy.  7.  GERD. -Continue PPI therapy.   All the records are reviewed and case discussed with ED provider. The plan of care was discussed in details with the patient (and family). I answered all questions. The patient agreed to proceed with the above mentioned plan. Further management will depend upon hospital course.   CODE STATUS: Full code  Status is: Inpatient  Remains inpatient appropriate because:Ongoing active pain requiring inpatient pain management, Ongoing diagnostic testing needed not appropriate for outpatient work up, Unsafe d/c plan, IV treatments appropriate due to intensity of illness or inability to take PO and Inpatient level of care appropriate due to severity of illness   Dispo: The patient is from: SNF              Anticipated d/c is to: SNF              Anticipated d/c date is: 3 days              Patient currently is not medically stable to d/c.   TOTAL TIME TAKING CARE OF THIS PATIENT: 60 minutes.    Christel Mormon M.D on 04/08/2020 at 3:09 AM  [facility]   From 7 PM-7 AM, contact night-coverage [email or web address]  CC: Primary care physician; [name], MD

## 2020-04-08 NOTE — ED Notes (Signed)
Patient sleeping at this time. No signs of distress. Will continue to monitor. 

## 2020-04-08 NOTE — ED Notes (Signed)
Pt returned from MRI at this time

## 2020-04-08 NOTE — Progress Notes (Signed)
CODE SEPSIS - PHARMACY COMMUNICATION  **Broad Spectrum Antibiotics should be administered within 1 hour of Sepsis diagnosis**  Time Code Sepsis Called/Page Received:  11/18 @ 0224   Antibiotics Ordered:  Cefepime , Vancomycin   Time of 1st antibiotic administration: Cefepime 2 gm IV X 1 @ 0248   Additional action taken by pharmacy:   If necessary, Name of Provider/Nurse Contacted:     Ravenne Wayment D ,PharmD Clinical Pharmacist  04/08/2020  3:22 AM

## 2020-04-08 NOTE — ED Notes (Signed)
Nurse not available at this time for report.

## 2020-04-08 NOTE — Consult Note (Signed)
Cephas Darby, MD [address]  [place]  [place], [place] [address]  Main: [phone]  Fax: [phone] Pager: [phone]   Consultation  Referring Provider:     No ref. provider found Primary Care Physician:  [name], MD Primary Gastroenterologist: Althia Forts          Reason for Consultation:   Elevated LFTs  Date of Admission:  04/07/2020 Date of Consultation:  04/08/2020         HPI:   Alice Wallace is a 79 y.o. female with history of COPD 4 L and oxygen, hypertension, osteoporosis, deaf and mute, Covid infection in Jan 3244 complicated by severe bullous emphysema, history of NSTEMI in 01/2020, right proximal femoral fracture s/p repair dependent on ADLs, paroxysmal A. fib.  Patient presented from nursing home yesterday due to complain of chest pain, upper abdominal pain, tachycardia, found to have abnormal LFTs Alk phos 396 AST/ALT 141/133.  Her alkaline phosphatase was mildly elevated 185 on 03/21/2020.  Normocytic anemia which is chronic and stable, normal PT/INR.  Patient is s/p cholecystectomy.  She initially underwent CT angio chest PE protocol, which revealed no evidence of PE, severe COPD, right lower lobe consolidation compatible with pneumonia, moderate size hiatal hernia, found to have biliary ductal dilation, large stool burden in the colon with possible fecal impaction in the rectum, no evidence of bowel obstruction.   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 This mass is in the region of the ampulla demonstrates mild extrinsic mass effect upon the distal pancreatic and common bile duct at the ampulla. This is best seen on image 26, series 3 and image 13, series 2. The mass demonstrates mass effect upon the adjacent duodenum, but does  not invade the structure. The mass is slightly T1 hypointense and T2 hyperintense in relation to the adjacent pancreatic parenchyma. Differential considerations include a primary pancreatic neoplasm or ampullary neoplasm. The pancreatic duct is not dilated. No pancreatic divisum. The remainder of the pancreas is unremarkable.  When interviewed the patient, she was lying comfortably in bed, she denied any abdominal pain reported lack of appetite she is non verbal but able to communicate with sign language  NSAIDs: None  Antiplts/Anticoagulants/Anti thrombotics: Plavix  GI Procedures: Unknown  Past Medical History:  Diagnosis Date  . COPD (chronic obstructive pulmonary disease) ([facility])   . Deaf   . Hypertension   . Osteoporosis     Past Surgical History:  Procedure Laterality Date  . INTRAMEDULLARY (IM) NAIL INTERTROCHANTERIC Right 01/26/2020   Procedure: Right Hip IM nail with TFNA;  Surgeon: Lovell Sheehan, MD;  Location: [facility] ORS;  Service: Orthopedics;  Laterality: Right;    Prior to Admission medications   Medication Sig Start Date End Date Taking? Authorizing Provider  acetaminophen (TYLENOL) 325 MG tablet Take 2 tablets (650 mg total) by mouth every 6 (six) hours. Patient taking differently: Take 650 mg by mouth every 6 (six) hours. Not to exceed 4 grams / 24 hours 02/03/20  Yes Nicole Kindred A, DO  acidophilus (RISAQUAD) CAPS capsule Take 1 capsule by mouth 3 (three) times daily. 02/03/20  Yes Nicole Kindred A, DO  albuterol (VENTOLIN HFA) 108 (90 Base) MCG/ACT inhaler Inhale 2 puffs into the lungs every 6 (six) hours as needed for wheezing or shortness  of breath.    Yes [provider]  alendronate (FOSAMAX) 70 MG tablet Take 70 mg by mouth every Monday. Take with a full glass of water on an empty stomach.    Yes [provider]  aluminum-magnesium hydroxide-simethicone (MAALOX) 200-200-20 MG/5ML SUSP Take 30 mLs by mouth 4 (four) times daily -  before meals  and at bedtime.   Yes [provider]  Amino Acids-Protein Hydrolys (FEEDING SUPPLEMENT, PRO-STAT SUGAR FREE 64,) LIQD Take 30 mLs by mouth 2 (two) times daily.   Yes [provider]  ascorbic acid (VITAMIN C) 500 MG tablet Take 500 mg by mouth daily.   Yes [provider]  aspirin EC 81 MG tablet Take 81 mg by mouth daily. Swallow whole.   Yes [provider]  atorvastatin (LIPITOR) 80 MG tablet Take 1 tablet (80 mg total) by mouth daily. 02/04/20  Yes Nicole Kindred A, DO  Calcium Carbonate-Vitamin D3 (CALCIUM 600-D) 600-400 MG-UNIT TABS Take 1 tablet by mouth 2 (two) times daily.   Yes [provider]  cetirizine (ZYRTEC) 10 MG tablet Take 10 mg by mouth daily.   Yes [provider]  Cholecalciferol (VITAMIN D) 125 MCG (5000 UT) CAPS Take 5,000 Units by mouth daily.   Yes [provider]  clopidogrel (PLAVIX) 75 MG tablet Take 1 tablet (75 mg total) by mouth daily. 02/04/20  Yes Nicole Kindred A, DO  diclofenac Sodium (VOLTAREN) 1 % GEL Apply 2 g topically 4 (four) times daily.  01/02/20  Yes [provider]  diltiazem (CARDIZEM CD) 120 MG 24 hr capsule Take 1 capsule (120 mg total) by mouth daily. 02/04/20  Yes Nicole Kindred A, DO  diphenhydrAMINE (BENADRYL) 25 MG tablet Take 25 mg by mouth every 6 (six) hours as needed for allergies.    Yes [provider]  docusate sodium (COLACE) 100 MG capsule Take 1 capsule (100 mg total) by mouth 2 (two) times daily. 02/03/20  Yes Nicole Kindred A, DO  escitalopram (LEXAPRO) 10 MG tablet Take 10 mg by mouth at bedtime. 06/08/16  Yes [provider]  ferrous sulfate 300 (60 Fe) MG/5ML syrup Take 300 mg by mouth in the morning and at bedtime.    Yes [provider]  Fluticasone-Salmeterol (ADVAIR) 100-50 MCG/DOSE AEPB Inhale 1 puff into the lungs 2 (two) times daily.   Yes [provider]  furosemide (LASIX) 20 MG tablet Take 20 mg by mouth daily.   Yes  [provider]  Ipratropium-Albuterol (COMBIVENT RESPIMAT) 20-100 MCG/ACT AERS respimat Inhale 1 puff into the lungs every 6 (six) hours as needed for wheezing.   Yes [provider]  loperamide (IMODIUM A-D) 2 MG tablet Take 2 mg by mouth daily as needed for diarrhea or loose stools.   Yes [provider]  magnesium hydroxide (MILK OF MAGNESIA) 400 MG/5ML suspension Take 30 mLs by mouth daily as needed for mild constipation.   Yes [provider]  metoCLOPramide (REGLAN) 5 MG tablet Take 5 mg by mouth every 8 (eight) hours as needed for nausea. If ondansetron does not work, give reglan.    Yes [provider]  metoprolol tartrate (LOPRESSOR) 25 MG tablet Take 6.25 mg by mouth 2 (two) times daily.   Yes [provider]  nitroGLYCERIN (NITROSTAT) 0.4 MG SL tablet Place 1 tablet (0.4 mg total) under the tongue every 5 (five) minutes as needed for chest pain. 02/03/20  Yes Nicole Kindred A, DO  nystatin cream (MYCOSTATIN) Apply  1 application topically 2 (two) times daily. 03/16/20  Yes [provider]  omeprazole (PRILOSEC) 40 MG capsule Take 40 mg by mouth daily. 03/04/20  Yes [provider]  ondansetron (ZOFRAN) 4 MG tablet Take 1 tablet (4 mg total) by mouth every 6 (six) hours as needed for nausea. 02/03/20  Yes Nicole Kindred A, DO  potassium chloride SA (KLOR-CON) 20 MEQ tablet Take 40 mEq by mouth daily.   Yes [provider]  sodium chloride 0.9 % infusion Inject 500 mLs into the vein 3 (three) times daily. 60 ml/ hour   Yes [provider]  traMADol (ULTRAM) 50 MG tablet Take 50 mg by mouth every 8 (eight) hours.   Yes [provider]  feeding supplement, ENSURE ENLIVE, (ENSURE ENLIVE) LIQD Take 237 mLs by mouth 3 (three) times daily between meals. 06/10/19   Fritzi Mandes, MD  OXYGEN Inhale 2 L/min into the lungs continuous.     [provider]    Current Facility-Administered  Medications:  .  0.9 %  sodium chloride infusion, , Intravenous, Continuous, Samtani, Jai-Gurmukh, MD, Last Rate: 75 mL/hr at 04/08/20 1900, New Bag at 04/08/20 1900 .  acetaminophen (TYLENOL) tablet 650 mg, 650 mg, Oral, Q6H PRN **OR** acetaminophen (TYLENOL) suppository 650 mg, 650 mg, Rectal, Q6H PRN, Mansy, Jan A, MD .  azithromycin (ZITHROMAX) 500 mg in sodium chloride 0.9 % 250 mL IVPB, 500 mg, Intravenous, Q24H, Mansy, Arvella Merles, MD, Stopped at 04/08/20 [phone] .  cefTRIAXone (ROCEPHIN) 2 g in sodium chloride 0.9 % 100 mL IVPB, 2 g, Intravenous, Q24H, Mansy, Arvella Merles, MD, Stopped at 04/08/20 7867 .  enoxaparin (LOVENOX) injection 40 mg, 40 mg, Subcutaneous, Q24H, Mansy, Jan A, MD, 40 mg at 04/08/20 1308 .  guaiFENesin (MUCINEX) 12 hr tablet 600 mg, 600 mg, Oral, BID, Mansy, Jan A, MD, 600 mg at 04/08/20 1308 .  ipratropium-albuterol (DUONEB) 0.5-2.5 (3) MG/3ML nebulizer solution 3 mL, 3 mL, Nebulization, QID, Mansy, Jan A, MD, 3 mL at 04/08/20 1955 .  magnesium hydroxide (MILK OF MAGNESIA) suspension 30 mL, 30 mL, Oral, Daily PRN, Mansy, Jan A, MD .  ondansetron (ZOFRAN) tablet 4 mg, 4 mg, Oral, Q6H PRN **OR** ondansetron (ZOFRAN) injection 4 mg, 4 mg, Intravenous, Q6H PRN, Mansy, Jan A, MD .  traZODone (DESYREL) tablet 25 mg, 25 mg, Oral, QHS PRN, Mansy, Arvella Merles, MD  No family history on file.   Social History   Tobacco Use  . Smoking status: Never Smoker  . Smokeless tobacco: Never Used  Substance Use Topics  . Alcohol use: Not Currently  . Drug use: Not Currently    Allergies as of 04/07/2020 - Review Complete 04/07/2020  Allergen Reaction Noted  . Hydrocodone  11/13/2018  . Naproxen  11/13/2018  . Other Other (See Comments) 11/13/2018  . Sulfamethoxazole-trimethoprim  11/13/2018  . Tramadol  11/13/2018    Review of Systems:    All systems reviewed and negative except where noted in HPI.   Physical Exam:  Vital signs in last 24 hours: Temp:  [97.6 F (36.4 C)-98.2 F (36.8 C)]  97.6 F (36.4 C) (11/18 2014) Pulse Rate:  [92-110] 92 (11/18 2014) Resp:  [14-26] 19 (11/18 2014) BP: (100-147)/(54-72) 134/70 (11/18 2014) SpO2:  [91 %-99 %] 97 % (11/18 2014) Weight:  [44.7 kg] 44.7 kg (11/18 0935)   General:   Pleasant, cooperative in NAD Head:  Normocephalic and atraumatic. Eyes:   No icterus.   Conjunctiva pink. PERRLA. Ears:  Normal  auditory acuity. Neck:  Supple; no masses or thyroidomegaly Lungs: Respirations even and unlabored. Lungs clear to auscultation bilaterally.   No wheezes, crackles, or rhonchi.  Heart:  Regular rate and rhythm;  Without murmur, clicks, rubs or gallops Abdomen:  Soft, nondistended, nontender. Normal bowel sounds. No appreciable masses or hepatomegaly.  No rebound or guarding.  Rectal:  Not performed. Msk:  Symmetrical without gross deformities.  Extremities:  Without edema, cyanosis or clubbing. Neurologic: Unable to assess Skin:  Intact without significant lesions or rashes. Cervical Nodes:  No significant cervical adenopathy. Psych:  Alert and cooperative. Normal affect.  LAB RESULTS: CBC Latest Ref Rng & Units 04/08/2020 04/07/2020 03/25/2020  WBC 4.0 - 10.5 K/uL 10.1 13.3(H) 7.5  Hemoglobin 12.0 - 15.0 g/dL 8.8(L) 9.8(L) 8.3(L)  Hematocrit 36 - 46 % 28.2(L) 31.3(L) 26.2(L)  Platelets 150 - 400 K/uL 231 320 327    BMET BMP Latest Ref Rng & Units 04/08/2020 04/07/2020 03/25/2020  Glucose 70 - 99 mg/dL 100(H) 98 120(H)  BUN 8 - 23 mg/dL _0 Creatinine 0.44 - 1.00 mg/dL 0.49 0.47 0.37(L)  Sodium 135 - 145 mmol/L 136 134(L) 134(L)  Potassium 3.5 - 5.1 mmol/L 4.0 3.6 2.7(LL)  Chloride 98 - 111 mmol/L 98 95(L) 96(L)  CO2 22 - 32 mmol/L _1 Calcium 8.9 - 10.3 mg/dL 8.6(L) 8.3(L) 7.8(L)    LFT Hepatic Function Latest Ref Rng & Units 04/07/2020 03/21/2020 01/22/2020  Total Protein 6.5 - 8.1 g/dL 6.4(L) 6.8 5.4(L)  Albumin 3.5 - 5.0 g/dL 2.7(L) 3.1(L) 3.1(L)  AST 15 - 41 U/L 141(H) 35 37  ALT 0 - 44 U/L 133(H) 37 30   Alk Phosphatase 38 - 126 U/L 396(H) 185(H) 53  Total Bilirubin 0.3 - 1.2 mg/dL 0.9 1.6(H) 0.6  Bilirubin, Direct 0.0 - 0.2 mg/dL - 0.2 -     STUDIES: DG Skull 1-3 Views  Result Date: 04/08/2020 CLINICAL DATA:  Initial evaluation for pre screening for MRI. EXAM: SKULL - 1-3 VIEW COMPARISON:  None. FINDINGS: There is no evidence of skull fracture or other focal bone lesions. No radiopaque foreign body seen overlying the region of either orbit or elsewhere about the skull. Paranasal sinuses are grossly pneumatized. IMPRESSION: Negative. No radiopaque foreign body identified. Electronically Signed   By: Jeannine Boga M.D.   On: 04/08/2020 04:08   DG Chest 2 View  Result Date: 04/07/2020 CLINICAL DATA:  Chest pain. EXAM: CHEST - 2 VIEW COMPARISON:  Radiographs 03/23/2020 and 03/22/2020. FINDINGS: The heart size and mediastinal contours are stable. There is diffuse aortic atherosclerosis. The lungs are hyperinflated with asymmetric bullous changes superiorly on the right and bibasilar scarring. No superimposed airspace disease, pleural effusion or pneumothorax identified. There are multiple chronic left-sided rib deformities. No acute osseous findings are seen. IMPRESSION: Chronic obstructive pulmonary disease with bibasilar scarring. No acute cardiopulmonary process. Electronically Signed   By: Richardean Sale M.D.   On: 04/07/2020 22:04   CT Angio Chest PE W/Cm &/Or Wo Cm  Result Date: 04/08/2020 CLINICAL DATA:  Funny feeling in chest. EXAM: CT ANGIOGRAPHY CHEST WITH CONTRAST TECHNIQUE: Multidetector CT imaging of the chest was performed using the standard protocol during bolus administration of intravenous contrast. Multiplanar CT image reconstructions and MIPs were obtained to evaluate the vascular anatomy. CONTRAST:  142m OMNIPAQUE IOHEXOL 350 MG/ML SOLN COMPARISON:  01/21/2020 FINDINGS: Cardiovascular: No filling defects in the pulmonary arteries to suggest pulmonary emboli. Heart is  normal size. Coronary artery and aortic calcifications.  No dissection. Mediastinum/Nodes: No adenopathy in the mediastinum or axilla. Mildly prominent right hilar lymph nodes, likely reactive. Moderate-sized hiatal hernia. Lungs/Pleura: Severe COPD. 15 mm triangular nodule in the right upper lobe is stable since prior study. Consolidation in the right lower lobe compatible with pneumonia. No confluent opacity on the left. Upper Abdomen: Imaging into the upper abdomen demonstrates no acute findings. Musculoskeletal: Chest wall soft tissues are unremarkable. No acute bony abnormality. Review of the MIP images confirms the above findings. IMPRESSION: No evidence of pulmonary embolus. Severe COPD.  Stable 15 mm right upper lobe nodule. Consolidation in the right lower lobe compatible with pneumonia. Moderate-sized hiatal hernia. Aortic Atherosclerosis (ICD10-I70.0) and Emphysema (ICD10-J43.9). Electronically Signed   By: Rolm Baptise M.D.   On: 04/08/2020 00:45   CT Abdomen Pelvis W Contrast  Result Date: 04/08/2020 CLINICAL DATA:  Funny feeling in chest. EXAM: CT ABDOMEN AND PELVIS WITH CONTRAST TECHNIQUE: Multidetector CT imaging of the abdomen and pelvis was performed using the standard protocol following bolus administration of intravenous contrast. CONTRAST:  147m OMNIPAQUE IOHEXOL 350 MG/ML SOLN COMPARISON:  10/14/2012 FINDINGS: Lower chest: Area consolidation in the right lower lobe concerning for pneumonia. Moderate-sized hiatal hernia. Emphysema. Hepatobiliary: Prior cholecystectomy. Associated biliary ductal dilatation. No focal hepatic abnormality. Pancreas: No focal abnormality or ductal dilatation. Spleen: Scattered punctate calcifications compatible with old granulomatous disease. Normal size. Adrenals/Urinary Tract: No adrenal abnormality. No focal renal abnormality. No stones or hydronephrosis. Urinary bladder is unremarkable. Stomach/Bowel: Large stool burden within the colon. Question fecal  impaction within the rectum. No evidence of bowel obstruction. Vascular/Lymphatic: Heavily calcified aorta and iliac vessels. No evidence of aneurysm or adenopathy. Reproductive: Prior hysterectomy.  No adnexal masses. Other: No free fluid or free air. Musculoskeletal: No acute bony abnormality. IMPRESSION: No acute findings in the abdomen or pelvis. Large stool burden throughout the colon, question fecal impaction. Aortic atherosclerosis. Consolidation in the right lower lobe compatible with pneumonia. Electronically Signed   By: KRolm BaptiseM.D.   On: 04/08/2020 00:47   MR 3D Recon At Scanner  Result Date: 04/08/2020 CLINICAL DATA:  Sepsis, biliary ductal dilation. EXAM: MRI ABDOMEN WITHOUT AND WITH CONTRAST (INCLUDING MRCP) TECHNIQUE: Multiplanar multisequence MR imaging of the abdomen was performed both before and after the administration of intravenous contrast. Heavily T2-weighted images of the biliary and pancreatic ducts were obtained, and three-dimensional MRCP images were rendered by post processing. CONTRAST:  573mGADAVIST GADOBUTROL 1 MMOL/ML IV SOLN COMPARISON:  None. FINDINGS: Lower chest: Right lower lobe consolidation. Trace right pleural effusion. Minimal left basilar infiltrate or scarring. Moderate hiatal hernia. Cardiac size within normal limits. Hepatobiliary: Tiny cyst noted within the left hepatic lobe. No solid liver mass. Portal vein is patent. Mild intra and moderate extrahepatic biliary ductal dilation. The extrahepatic duct is tortuous, however, there is no intraluminal filling defect identified to suggest choledocholithiasis. Cholecystectomy has been performed. Pancreas: There is a mass within the head of the pancreas measuring 2.4 x 1.6 cm in greatest dimension. This mass is in the region of the ampulla demonstrates mild extrinsic mass effect upon the distal pancreatic and common bile duct at the ampulla. This is best seen on image 26, series 3 and image 13, series 2. The mass  demonstrates mass effect upon the adjacent duodenum, but does not invade the structure. The mass is slightly T1 hypointense and T2 hyperintense in relation to the adjacent pancreatic parenchyma. Differential considerations include a primary pancreatic neoplasm or ampullary neoplasm. The pancreatic duct is not dilated.  No pancreatic divisum. The remainder of the pancreas is unremarkable. Spleen:  Unremarkable Adrenals/Urinary Tract: The adrenal glands are unremarkable. Tiny simple cortical cysts are seen within the kidneys bilaterally. The kidneys are otherwise unremarkable. Stomach/Bowel: The visualized bowel is unremarkable. Vascular/Lymphatic: No pathologic lymphadenopathy within the abdomen. Abdominal vasculature is unremarkable. Specifically, the portal vein is patent. Other:  None significant Musculoskeletal: Normal bone marrow signal characteristic. IMPRESSION: Status post cholecystectomy. Dilation of the intra and extrahepatic biliary tree is noted likely representing post cholecystectomy change. No choledocholithiasis. 2.4 cm solid mass within the head of the pancreas demonstrating mild mass effect upon the distal common bile duct and pancreatic duct at the ampulla and mass effect upon the adjacent duodenum. Differential considerations for read by a primary pancreatic neoplasm or ampullary neoplasm. Endoscopic ultrasound may be helpful for tissue sampling. Electronically Signed   By: Fidela Salisbury MD   On: 04/08/2020 05:14   DG Foot Complete Right  Result Date: 04/08/2020 CLINICAL DATA:  Evaluate for right foot osteomyelitis. EXAM: RIGHT FOOT COMPLETE - 3+ VIEW COMPARISON:  None. FINDINGS: No fracture or bone lesion. There are no areas of bone resorption to suggest osteomyelitis. Calcaneus shows a Haglund deformity and small plantar and calcaneal spurs. Joints are normally aligned. No soft tissue air. IMPRESSION: 1. No fracture or acute finding.  No evidence of osteomyelitis. Electronically Signed    By: Lajean Manes M.D.   On: 04/08/2020 11:20   MR ABDOMEN MRCP W WO CONTAST  Result Date: 04/08/2020 CLINICAL DATA:  Sepsis, biliary ductal dilation. EXAM: MRI ABDOMEN WITHOUT AND WITH CONTRAST (INCLUDING MRCP) TECHNIQUE: Multiplanar multisequence MR imaging of the abdomen was performed both before and after the administration of intravenous contrast. Heavily T2-weighted images of the biliary and pancreatic ducts were obtained, and three-dimensional MRCP images were rendered by post processing. CONTRAST:  71m GADAVIST GADOBUTROL 1 MMOL/ML IV SOLN COMPARISON:  None. FINDINGS: Lower chest: Right lower lobe consolidation. Trace right pleural effusion. Minimal left basilar infiltrate or scarring. Moderate hiatal hernia. Cardiac size within normal limits. Hepatobiliary: Tiny cyst noted within the left hepatic lobe. No solid liver mass. Portal vein is patent. Mild intra and moderate extrahepatic biliary ductal dilation. The extrahepatic duct is tortuous, however, there is no intraluminal filling defect identified to suggest choledocholithiasis. Cholecystectomy has been performed. Pancreas: There is a mass within the head of the pancreas measuring 2.4 x 1.6 cm in greatest dimension. This mass is in the region of the ampulla demonstrates mild extrinsic mass effect upon the distal pancreatic and common bile duct at the ampulla. This is best seen on image 26, series 3 and image 13, series 2. The mass demonstrates mass effect upon the adjacent duodenum, but does not invade the structure. The mass is slightly T1 hypointense and T2 hyperintense in relation to the adjacent pancreatic parenchyma. Differential considerations include a primary pancreatic neoplasm or ampullary neoplasm. The pancreatic duct is not dilated. No pancreatic divisum. The remainder of the pancreas is unremarkable. Spleen:  Unremarkable Adrenals/Urinary Tract: The adrenal glands are unremarkable. Tiny simple cortical cysts are seen within the kidneys  bilaterally. The kidneys are otherwise unremarkable. Stomach/Bowel: The visualized bowel is unremarkable. Vascular/Lymphatic: No pathologic lymphadenopathy within the abdomen. Abdominal vasculature is unremarkable. Specifically, the portal vein is patent. Other:  None significant Musculoskeletal: Normal bone marrow signal characteristic. IMPRESSION: Status post cholecystectomy. Dilation of the intra and extrahepatic biliary tree is noted likely representing post cholecystectomy change. No choledocholithiasis. 2.4 cm solid mass within the head of the pancreas  demonstrating mild mass effect upon the distal common bile duct and pancreatic duct at the ampulla and mass effect upon the adjacent duodenum. Differential considerations for read by a primary pancreatic neoplasm or ampullary neoplasm. Endoscopic ultrasound may be helpful for tissue sampling. Electronically Signed   By: Fidela Salisbury MD   On: 04/08/2020 05:14      Impression / Plan:   Alice Wallace is a 79 y.o. female with history of COPD on 4 L and oxygen, hypertension, osteoporosis, deaf and mute, Covid infection in Jan 7711 complicated by severe bullous emphysema, history of NSTEMI in 01/2020, right proximal femoral fracture s/p repair, dependent on ADLs, paroxysmal A. fib, on Plavix is admitted with sepsis secondary to right lower lobe pneumonia, incidentally found to have elevated LFTs with biliary dilation.  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  Very less likely ascending cholangitis.  Total bilirubin is normal with elevated alkaline phosphatase and transaminases Patient will need nonurgent biliary decompression with stent placement, currently  being treated for pneumonia and patient is on Plavix as outpatient, unclear about the last dose.  Discussed with Dr. Allen Norris.  If patient is not discharged until early next week, ERCP can be performed on Monday or as outpatient.  If patient develops cholangitis, patient needs to be transferred to [facility] for urgent ERCP  Right lower lobe pneumonia Currently being treated with antibiotics, oxygenating well on 4 L of oxygen  Thank you for involving me in the care of this patient.      LOS: 0 days   Sherri Sear, MD  04/08/2020, 9:42 PM   Note: This dictation was prepared with Dragon dictation along with smaller phrase technology. Any transcriptional errors that result from this process are unintentional.

## 2020-04-08 NOTE — ED Notes (Signed)
Called the floor. Asked to give nurse 10 minutes to look at chart.

## 2020-04-08 NOTE — Progress Notes (Signed)
PHARMACY -  BRIEF ANTIBIOTIC NOTE   Pharmacy has received consult(s) for Vancomycin, Cefepime from an ED provider.  The patient's profile has been reviewed for ht/wt/allergies/indication/available labs.    One time order(s) placed for Vanc 1 gm IV X 1 and Cefepime 2 gm IV X 1   Further antibiotics/pharmacy consults should be ordered by admitting physician if indicated.                       Thank you, Jamiria Langill D 04/08/2020  2:39 AM

## 2020-04-08 NOTE — ED Notes (Signed)
Spoke with floor. Patient has not yet been approved; therefore, will not accept report. They state that they will need to speak to the doctor first.

## 2020-04-08 NOTE — Progress Notes (Signed)
Patient seen and examined agree with plan of care per Dr. 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 fib  Admit acute onset midline chest pain, heart rate 114 pulse ox 89% on 4 L Alk phos 396 AST/ALT 141/133 lactic acid 1.1 WBC 13.3 CXR COPD bibasilar scarring CT abdomen pelvis no PE but 15 mm right upper lobe nodule and consolidation right lower lobe Given elevated LFTs MRCP performed as below which shows 2.4 cm solid mass head of pancreas and mass-effect on CBD pancreatic duct--- GI input is pending Started broad-spectrum cefepime azithromycin for pneumonia.    On exam BP (!) 147/65 (BP Location: Right Arm)   Pulse 95   Temp 97.7 F (36.5 C) (Oral)   Resp (!) 26   Ht _0  (1.651 m)   Wt 44.7 kg   SpO2 95%   BMI 16.41 kg/m  Patient is stable awake alert but does have some difficulty communicating given her deafness She is very frail and cachectic appearing S1-S2 no murmur no rub no gallop Patient communicates primarily by writing on paper Abdomen is soft scaphoid She has multiple decubiti on her sacral She also has an unstageable wound in her right lower extremity which has some slough  Plan Patient has pneumonia and will need to continue saline 75 cc an hour we will continue broad-spectrum antibiotics cefepime and azithromycin for now Regarding her pancreatic mass she may benefit from EUS however I will discuss this with family once GI sees and see if they agree-she is frail has a BMI of 16 and probably is not a great candidate for any type of procedure at least  currently x-rays of her right lower extremity to make sure there is no active osteomyelitis although I do not see any bruising or active infection We will get wound care to see patient right lower extremity  Verneita Griffes, MD [facility] 10:03 AM

## 2020-04-09 ENCOUNTER — Inpatient Hospital Stay: Payer: Medicare Other

## 2020-04-09 DIAGNOSIS — K869 Disease of pancreas, unspecified: Secondary | ICD-10-CM | POA: Diagnosis not present

## 2020-04-09 DIAGNOSIS — I10 Essential (primary) hypertension: Secondary | ICD-10-CM

## 2020-04-09 DIAGNOSIS — E43 Unspecified severe protein-calorie malnutrition: Secondary | ICD-10-CM | POA: Insufficient documentation

## 2020-04-09 DIAGNOSIS — M818 Other osteoporosis without current pathological fracture: Secondary | ICD-10-CM

## 2020-04-09 DIAGNOSIS — L8990 Pressure ulcer of unspecified site, unspecified stage: Secondary | ICD-10-CM | POA: Insufficient documentation

## 2020-04-09 DIAGNOSIS — R109 Unspecified abdominal pain: Secondary | ICD-10-CM | POA: Diagnosis not present

## 2020-04-09 DIAGNOSIS — J189 Pneumonia, unspecified organism: Secondary | ICD-10-CM

## 2020-04-09 DIAGNOSIS — R059 Cough, unspecified: Secondary | ICD-10-CM

## 2020-04-09 DIAGNOSIS — H913 Deaf nonspeaking, not elsewhere classified: Secondary | ICD-10-CM

## 2020-04-09 DIAGNOSIS — R079 Chest pain, unspecified: Secondary | ICD-10-CM

## 2020-04-09 DIAGNOSIS — R7989 Other specified abnormal findings of blood chemistry: Secondary | ICD-10-CM | POA: Diagnosis not present

## 2020-04-09 DIAGNOSIS — D649 Anemia, unspecified: Secondary | ICD-10-CM

## 2020-04-09 LAB — COMPREHENSIVE METABOLIC PANEL
ALT: 85 U/L — ABNORMAL HIGH (ref 0–44)
AST: 68 U/L — ABNORMAL HIGH (ref 15–41)
Albumin: 2.3 g/dL — ABNORMAL LOW (ref 3.5–5.0)
Alkaline Phosphatase: 329 U/L — ABNORMAL HIGH (ref 38–126)
Anion gap: 13 (ref 5–15)
BUN: 8 mg/dL (ref 8–23)
CO2: 23 mmol/L (ref 22–32)
Calcium: 8.1 mg/dL — ABNORMAL LOW (ref 8.9–10.3)
Chloride: 99 mmol/L (ref 98–111)
Creatinine, Ser: <0.3 mg/dL — ABNORMAL LOW (ref 0.44–1.00)
Glucose, Bld: 76 mg/dL (ref 70–99)
Potassium: 3.1 mmol/L — ABNORMAL LOW (ref 3.5–5.1)
Sodium: 135 mmol/L (ref 135–145)
Total Bilirubin: 0.8 mg/dL (ref 0.3–1.2)
Total Protein: 5.4 g/dL — ABNORMAL LOW (ref 6.5–8.1)

## 2020-04-09 LAB — CBC WITH DIFFERENTIAL/PLATELET
Abs Immature Granulocytes: 0.04 10*3/uL (ref 0.00–0.07)
Basophils Absolute: 0 10*3/uL (ref 0.0–0.1)
Basophils Relative: 0 %
Eosinophils Absolute: 0.1 10*3/uL (ref 0.0–0.5)
Eosinophils Relative: 1 %
HCT: 25.3 % — ABNORMAL LOW (ref 36.0–46.0)
Hemoglobin: 8 g/dL — ABNORMAL LOW (ref 12.0–15.0)
Immature Granulocytes: 1 %
Lymphocytes Relative: 8 %
Lymphs Abs: 0.5 10*3/uL — ABNORMAL LOW (ref 0.7–4.0)
MCH: 27.6 pg (ref 26.0–34.0)
MCHC: 31.6 g/dL (ref 30.0–36.0)
MCV: 87.2 fL (ref 80.0–100.0)
Monocytes Absolute: 0.3 10*3/uL (ref 0.1–1.0)
Monocytes Relative: 4 %
Neutro Abs: 6.1 10*3/uL (ref 1.7–7.7)
Neutrophils Relative %: 86 %
Platelets: 202 10*3/uL (ref 150–400)
RBC: 2.9 MIL/uL — ABNORMAL LOW (ref 3.87–5.11)
RDW: 17.7 % — ABNORMAL HIGH (ref 11.5–15.5)
WBC: 7.1 10*3/uL (ref 4.0–10.5)
nRBC: 0 % (ref 0.0–0.2)

## 2020-04-09 LAB — MAGNESIUM: Magnesium: 1.3 mg/dL — ABNORMAL LOW (ref 1.7–2.4)

## 2020-04-09 MED ORDER — ENSURE ENLIVE PO LIQD
237.0000 mL | Freq: Two times a day (BID) | ORAL | Status: DC
  Administered 2020-04-10: 237 mL via ORAL

## 2020-04-09 MED ORDER — ADULT MULTIVITAMIN W/MINERALS CH
1.0000 | ORAL_TABLET | Freq: Every day | ORAL | Status: DC
  Administered 2020-04-10: 1 via ORAL
  Filled 2020-04-09: qty 1

## 2020-04-09 MED ORDER — DILTIAZEM HCL ER COATED BEADS 120 MG PO CP24
120.0000 mg | ORAL_CAPSULE | Freq: Every day | ORAL | Status: DC
  Administered 2020-04-09 – 2020-04-10 (×2): 120 mg via ORAL
  Filled 2020-04-09 (×2): qty 1

## 2020-04-09 MED ORDER — ALBUTEROL SULFATE (2.5 MG/3ML) 0.083% IN NEBU: 2.5000 mg | INHALATION_SOLUTION | RESPIRATORY_TRACT | Status: DC | PRN

## 2020-04-09 MED ORDER — ASPIRIN EC 81 MG PO TBEC
81.0000 mg | DELAYED_RELEASE_TABLET | Freq: Every day | ORAL | Status: DC
  Administered 2020-04-09 – 2020-04-10 (×2): 81 mg via ORAL
  Filled 2020-04-09 (×2): qty 1

## 2020-04-09 MED ORDER — LEVOFLOXACIN 500 MG PO TABS: 500.0000 mg | ORAL_TABLET | Freq: Every day | ORAL | Status: DC

## 2020-04-09 MED ORDER — LEVOFLOXACIN 500 MG PO TABS
750.0000 mg | ORAL_TABLET | Freq: Every day | ORAL | Status: DC
  Administered 2020-04-10: 750 mg via ORAL
  Filled 2020-04-09: qty 2

## 2020-04-09 MED ORDER — CLOPIDOGREL BISULFATE 75 MG PO TABS
75.0000 mg | ORAL_TABLET | Freq: Every day | ORAL | Status: DC
  Administered 2020-04-09 – 2020-04-10 (×2): 75 mg via ORAL
  Filled 2020-04-09 (×2): qty 1

## 2020-04-09 MED ORDER — IPRATROPIUM-ALBUTEROL 0.5-2.5 (3) MG/3ML IN SOLN
3.0000 mL | Freq: Two times a day (BID) | RESPIRATORY_TRACT | Status: DC
  Filled 2020-04-09: qty 3

## 2020-04-09 MED ORDER — MAGNESIUM SULFATE IN D5W 1-5 GM/100ML-% IV SOLN
1.0000 g | Freq: Once | INTRAVENOUS | Status: AC
  Administered 2020-04-09: 1 g via INTRAVENOUS
  Filled 2020-04-09: qty 100

## 2020-04-09 MED ORDER — POTASSIUM CHLORIDE CRYS ER 20 MEQ PO TBCR
40.0000 meq | EXTENDED_RELEASE_TABLET | Freq: Every day | ORAL | Status: DC
  Administered 2020-04-09 – 2020-04-10 (×2): 40 meq via ORAL
  Filled 2020-04-09 (×2): qty 2

## 2020-04-09 MED ORDER — TIOTROPIUM BROMIDE MONOHYDRATE 18 MCG IN CAPS
18.0000 ug | ORAL_CAPSULE | Freq: Every day | RESPIRATORY_TRACT | Status: DC
  Administered 2020-04-09 – 2020-04-10 (×2): 18 ug via RESPIRATORY_TRACT
  Filled 2020-04-09: qty 5

## 2020-04-09 MED ORDER — ALBUTEROL SULFATE (2.5 MG/3ML) 0.083% IN NEBU: 2.5000 mg | INHALATION_SOLUTION | Freq: Four times a day (QID) | RESPIRATORY_TRACT | Status: DC | PRN

## 2020-04-09 MED ORDER — ESCITALOPRAM OXALATE 10 MG PO TABS
10.0000 mg | ORAL_TABLET | Freq: Every day | ORAL | Status: DC
  Administered 2020-04-09: 10 mg via ORAL
  Filled 2020-04-09 (×2): qty 1

## 2020-04-09 NOTE — Consult Note (Signed)
[facility]  [phone] [phone]  ID: Alice Wallace OB: 07/28/1940  MR#: [id number]  LKG#:[id number]  Patient Care Team: Rosetta Posner, MD as PCP - General (Geriatric Medicine) Eliezer Lofts, NP as Nurse Practitioner Rosetta Posner, MD as Referring Physician (Geriatric Medicine)  CHIEF COMPLAINT: Pancreatic mass.  INTERVAL HISTORY: Patient is a 79 year old female with multiple medical problems who recently presented to the emergency room with acute onset chest pain.  Subsequent work-up was concerning for pneumonia as well as an elevated troponin level.  She complains of abdominal pain and persistent cough.  Patient is deaf and mute therefore entire history was obtained through writing notes.  MRCP completed on April 08, 2020 revealed a 2.4 x 1.6 cm mass in the head of the pancreas.  No liver lesions were noted.  Patient offers no further specific complaints today.  REVIEW OF SYSTEMS:   Review of Systems  Constitutional: Negative.  Negative for fever, malaise/fatigue and weight loss.  HENT: Positive for hearing loss.   Respiratory: Positive for cough. Negative for hemoptysis and shortness of breath.   Cardiovascular: Negative.  Negative for chest pain and leg swelling.  Gastrointestinal: Positive for abdominal pain. Negative for nausea and vomiting.  Genitourinary: Negative.  Negative for dysuria.  Musculoskeletal: Negative.  Negative for back pain.  Skin: Negative.  Negative for rash.  Neurological: Negative.  Negative for dizziness, focal weakness, weakness and headaches.  Psychiatric/Behavioral: Negative.  The patient is not nervous/anxious.     As per HPI. Otherwise, a complete review of systems is negative.  PAST MEDICAL HISTORY: Past Medical History:  Diagnosis Date  . COPD (chronic obstructive pulmonary disease) (HCC)   . Deaf   . Hypertension   . Osteoporosis     PAST SURGICAL HISTORY: Past Surgical History:    Procedure Laterality Date  . INTRAMEDULLARY (IM) NAIL INTERTROCHANTERIC Right 01/26/2020   Procedure: Right Hip IM nail with TFNA;  Surgeon: Lyndle Herrlich, MD;  Location: [facility] ORS;  Service: Orthopedics;  Laterality: Right;    FAMILY HISTORY: No family history on file.  ADVANCED DIRECTIVES (Y/N):  @  HEALTH MAINTENANCE: Social History   Tobacco Use  . Smoking status: Never Smoker  . Smokeless tobacco: Never Used  Substance Use Topics  . Alcohol use: Not Currently  . Drug use: Not Currently     Colonoscopy:  PAP:  Bone density:  Lipid panel:  Allergies  Allergen Reactions  . Hydrocodone     Other reaction(s): Unknown  . Naproxen     Other reaction(s): Unknown  . Other Other (See Comments)    nuts  . Sulfamethoxazole-Trimethoprim     Other reaction(s): Unknown  . Tramadol     Other reaction(s): Unknown Pt has tolerated this med 1/5-1/7 with no issues    Current Facility-Administered Medications  Medication Dose Route Frequency Provider Last Rate Last Admin  . acetaminophen (TYLENOL) tablet 650 mg  650 mg Oral Q6H PRN Mansy, Jan A, MD       Or  . acetaminophen (TYLENOL) suppository 650 mg  650 mg Rectal Q6H PRN Mansy, Jan A, MD      . albuterol (PROVENTIL) (2.5 MG/3ML) 0.083% nebulizer solution 2.5 mg  2.5 mg Inhalation Q6H PRN Rhetta Mura, MD      . aspirin EC tablet 81 mg  81 mg Oral Daily Samtani, Jai-Gurmukh, MD      . Chlorhexidine Gluconate Cloth 2 % PADS 6 each  6 each Topical Q0600 Rhetta Mura, MD  6 each at 04/09/20 0434  . clopidogrel (PLAVIX) tablet 75 mg  75 mg Oral Daily Rhetta Mura, MD      . diltiazem (CARDIZEM CD) 24 hr capsule 120 mg  120 mg Oral Daily Samtani, Jai-Gurmukh, MD      . enoxaparin (LOVENOX) injection 40 mg  40 mg Subcutaneous Q24H Mansy, Jan A, MD   40 mg at 04/09/20 0918  . escitalopram (LEXAPRO) tablet 10 mg  10 mg Oral QHS Rhetta Mura, MD      . feeding supplement (ENSURE ENLIVE / ENSURE  PLUS) liquid 237 mL  237 mL Oral BID BM Samtani, Jai-Gurmukh, MD      . guaiFENesin (MUCINEX) 12 hr tablet 600 mg  600 mg Oral BID Mansy, Jan A, MD   600 mg at 04/09/20 0918  . ipratropium-albuterol (DUONEB) 0.5-2.5 (3) MG/3ML nebulizer solution 3 mL  3 mL Nebulization BID Rhetta Mura, MD      . Melene Muller ON 04/10/2020] levofloxacin (LEVAQUIN) tablet 750 mg  750 mg Oral Daily Samtani, Jai-Gurmukh, MD      . magnesium hydroxide (MILK OF MAGNESIA) suspension 30 mL  30 mL Oral Daily PRN Mansy, Vernetta Honey, MD      . Melene Muller ON 04/10/2020] multivitamin with minerals tablet 1 tablet  1 tablet Oral Daily Rhetta Mura, MD      . mupirocin ointment (BACTROBAN) 2 % 1 application  1 application Nasal BID Rhetta Mura, MD   1 application at 04/09/20 0919  . ondansetron (ZOFRAN) tablet 4 mg  4 mg Oral Q6H PRN Mansy, Jan A, MD       Or  . ondansetron [facility]) injection 4 mg  4 mg Intravenous Q6H PRN Mansy, Jan A, MD      . potassium chloride SA (KLOR-CON) CR tablet 40 mEq  40 mEq Oral Daily Rhetta Mura, MD   40 mEq at 04/09/20 0918  . tiotropium (SPIRIVA) inhalation capsule ([facility] use ONLY) 18 mcg  18 mcg Inhalation Daily Rhetta Mura, MD      . traZODone (DESYREL) tablet 25 mg  25 mg Oral QHS PRN Mansy, Jan A, MD        OBJECTIVE: Vitals:   04/09/20 1150 04/09/20 1625  BP: (!) 143/65 (!) 145/98  Pulse:    Resp: (!) 22 18  Temp: 98.5 F (36.9 C) 98.4 F (36.9 C)  SpO2: 97%      Body mass index is 17.11 kg/m.    ECOG FS:3 - Symptomatic, >50% confined to bed  General: Well-developed, well-nourished, no acute distress. Eyes: Pink conjunctiva, anicteric sclera. HEENT: Normocephalic, moist mucous membranes. Lungs: No audible wheezing or coughing. Heart: Regular rate and rhythm. Abdomen: Soft, nontender, no obvious distention. Musculoskeletal: No edema, cyanosis, or clubbing. Neuro: Alert. Cranial nerves grossly intact. Skin: No rashes or petechiae noted. Psych:  Normal affect.   LAB RESULTS:  Lab Results  Component Value Date   NA 135 04/09/2020   K 3.1 (L) 04/09/2020   CL 99 04/09/2020   CO2 23 04/09/2020   GLUCOSE 76 04/09/2020   BUN 8 04/09/2020   CREATININE <0.30 (L) 04/09/2020   CALCIUM 8.1 (L) 04/09/2020   PROT 5.4 (L) 04/09/2020   ALBUMIN 2.3 (L) 04/09/2020   AST 68 (H) 04/09/2020   ALT 85 (H) 04/09/2020   ALKPHOS 329 (H) 04/09/2020   BILITOT 0.8 04/09/2020   GFRNONAA NOT CALCULATED 04/09/2020   GFRAA >60 02/03/2020    Lab Results  Component Value Date   WBC 7.1 04/09/2020  NEUTROABS 6.1 04/09/2020   HGB 8.0 (L) 04/09/2020   HCT 25.3 (L) 04/09/2020   MCV 87.2 04/09/2020   PLT 202 04/09/2020     STUDIES: EEG  Result Date: 03/24/2020 Charlsie QuestYadav, Priyanka O, MD     03/24/2020 10:26 AM Patient Name: Alice LeveringMadlyn Gillian MRN: [id number] Epilepsy Attending: Charlsie QuestPriyanka O Yadav Referring Physician/Provider: Dr Fran LowesAva Swayze Date: 03/24/2020 Duration: 22.49 mins Patient history: 79yo F with ams. EEG to evaluate for seizure Level of alertness:  lethargic AEDs during EEG study: None Technical aspects: This EEG study was done with scalp electrodes positioned according to the 10-20 International system of electrode placement. Electrical activity was acquired at a sampling rate of 500Hz  and reviewed with a high frequency filter of 70Hz  and a low frequency filter of 1Hz . EEG data were recorded continuously and digitally stored. Description: No clear posterior dominant rhythm was seen. EEG showed continuous generalized 2 to 5 Hz theta-delta slowing. Physiologic photic driving was not seen during photic stimulation.  Hyperventilation was not performed.   ABNORMALITY -Continuous slow, generalized IMPRESSION: This study is suggestive of moderate diffuse encephalopathy, nonspecific etiology. No seizures or epileptiform discharges were seen throughout the recording. Charlsie QuestPriyanka O Yadav   DG Skull 1-3 Views  Result Date: 04/08/2020 CLINICAL DATA:  Initial evaluation  for pre screening for MRI. EXAM: SKULL - 1-3 VIEW COMPARISON:  None. FINDINGS: There is no evidence of skull fracture or other focal bone lesions. No radiopaque foreign body seen overlying the region of either orbit or elsewhere about the skull. Paranasal sinuses are grossly pneumatized. IMPRESSION: Negative. No radiopaque foreign body identified. Electronically Signed   By: Rise MuBenjamin  McClintock M.D.   On: 04/08/2020 04:08   DG Chest 1 View  Result Date: 03/23/2020 CLINICAL DATA:  Shortness of breath EXAM: CHEST  1 VIEW COMPARISON:  03/22/2020 FINDINGS: Hyperinflation of the lungs. Bilateral lower lobe airspace opacities are again noted, unchanged. Heart is normal size. Old bilateral rib fractures. IMPRESSION: COPD/chronic changes. Bilateral lower lobe airspace opacities, stable. Cannot exclude pneumonia. Electronically Signed   By: Charlett NoseKevin  Dover M.D.   On: 03/23/2020 02:54   DG Chest 2 View  Result Date: 04/07/2020 CLINICAL DATA:  Chest pain. EXAM: CHEST - 2 VIEW COMPARISON:  Radiographs 03/23/2020 and 03/22/2020. FINDINGS: The heart size and mediastinal contours are stable. There is diffuse aortic atherosclerosis. The lungs are hyperinflated with asymmetric bullous changes superiorly on the right and bibasilar scarring. No superimposed airspace disease, pleural effusion or pneumothorax identified. There are multiple chronic left-sided rib deformities. No acute osseous findings are seen. IMPRESSION: Chronic obstructive pulmonary disease with bibasilar scarring. No acute cardiopulmonary process. Electronically Signed   By: Carey BullocksWilliam  Veazey M.D.   On: 04/07/2020 22:04   DG Chest 2 View  Result Date: 03/21/2020 CLINICAL DATA:  Shortness of breath. EXAM: CHEST - 2 VIEW COMPARISON:  January 30, 2020 FINDINGS: Upper limits of normal cardiac silhouette. Calcific atherosclerotic disease and tortuosity of the aorta. Severe upper lobe predominant emphysematous changes of the lungs are chronic. Patchy airspace  consolidation versus atelectasis in bilateral lower lobes. IMPRESSION: 1. Patchy airspace consolidation versus atelectasis in bilateral lower lobes. 2. 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 Electronically Signed   By: Ted Mcalpineobrinka  Dimitrova M.D.   On: 03/21/2020 17:59   CT HEAD WO CONTRAST  Result Date: 03/22/2020 CLINICAL DATA:  Mental status change. EXAM: CT HEAD WITHOUT CONTRAST TECHNIQUE: Contiguous axial images were obtained from the base of the skull through the vertex without intravenous contrast. COMPARISON:  01/17/2019 FINDINGS: Brain: There is no evidence of an acute infarct, intracranial hemorrhage, mass, midline shift, or extra-axial fluid collection. The ventricles and sulci are within normal limits for age. Patchy hypodensities in the cerebral white matter bilaterally are unchanged and nonspecific but compatible with moderate chronic small vessel ischemic disease. Vascular: Calcified atherosclerosis at the skull base. No hyperdense vessel. Skull: No acute fracture or suspicious osseous lesion. Sinuses/Orbits: Moderate volume fluid in the right maxillary sinus. No significant mastoid fluid. Bilateral cataract extraction. Other: None. IMPRESSION: 1. No evidence of acute intracranial abnormality. 2. Moderate chronic small vessel ischemic disease. 3. Right maxillary sinus fluid, correlate for acute sinusitis. Electronically Signed   By: Sebastian Ache M.D.   On: 03/22/2020 14:55   CT Angio Chest PE W/Cm &/Or Wo Cm  Result Date: 04/08/2020 CLINICAL DATA:  Funny feeling in chest. EXAM: CT ANGIOGRAPHY CHEST WITH CONTRAST TECHNIQUE: Multidetector CT imaging of the chest was performed using the standard protocol during bolus administration of intravenous contrast. Multiplanar CT image reconstructions and MIPs were obtained to evaluate the vascular anatomy. CONTRAST:  OMNIPAQUE IOHEXOL 350 MG/ML SOLN COMPARISON:  01/21/2020 FINDINGS: Cardiovascular: No filling defects in the  pulmonary arteries to suggest pulmonary emboli. Heart is normal size. Coronary artery and aortic calcifications. No dissection. Mediastinum/Nodes: No adenopathy in the mediastinum or axilla. Mildly prominent right hilar lymph nodes, likely reactive. Moderate-sized hiatal hernia. Lungs/Pleura: Severe COPD. 15 mm triangular nodule in the right upper lobe is stable since prior study. Consolidation in the right lower lobe compatible with pneumonia. No confluent opacity on the left. Upper Abdomen: Imaging into the upper abdomen demonstrates no acute findings. Musculoskeletal: Chest wall soft tissues are unremarkable. No acute bony abnormality. Review of the MIP images confirms the above findings. IMPRESSION: No evidence of pulmonary embolus. Severe COPD.  Stable 15 mm right upper lobe nodule. Consolidation in the right lower lobe compatible with pneumonia. Moderate-sized hiatal hernia. Aortic Atherosclerosis (ICD10-I70.0) and Emphysema (ICD10-J43.9). Electronically Signed   By: Charlett Nose M.D.   On: 04/08/2020 00:45   CT Abdomen Pelvis W Contrast  Result Date: 04/08/2020 CLINICAL DATA:  Funny feeling in chest. EXAM: CT ABDOMEN AND PELVIS WITH CONTRAST TECHNIQUE: Multidetector CT imaging of the abdomen and pelvis was performed using the standard protocol following bolus administration of intravenous contrast. CONTRAST:  OMNIPAQUE IOHEXOL 350 MG/ML SOLN COMPARISON:  10/14/2012 FINDINGS: Lower chest: Area consolidation in the right lower lobe concerning for pneumonia. Moderate-sized hiatal hernia. Emphysema. Hepatobiliary: Prior cholecystectomy. Associated biliary ductal dilatation. No focal hepatic abnormality. Pancreas: No focal abnormality or ductal dilatation. Spleen: Scattered punctate calcifications compatible with old granulomatous disease. Normal size. Adrenals/Urinary Tract: No adrenal abnormality. No focal renal abnormality. No stones or hydronephrosis. Urinary bladder is unremarkable. Stomach/Bowel:  Large stool burden within the colon. Question fecal impaction within the rectum. No evidence of bowel obstruction. Vascular/Lymphatic: Heavily calcified aorta and iliac vessels. No evidence of aneurysm or adenopathy. Reproductive: Prior hysterectomy.  No adnexal masses. Other: No free fluid or free air. Musculoskeletal: No acute bony abnormality. IMPRESSION: No acute findings in the abdomen or pelvis. Large stool burden throughout the colon, question fecal impaction. Aortic atherosclerosis. Consolidation in the right lower lobe compatible with pneumonia. Electronically Signed   By: Charlett Nose M.D.   On: 04/08/2020 00:47   MR 3D Recon At Scanner  Result Date: 04/08/2020 CLINICAL DATA:  Sepsis, biliary ductal dilation. EXAM: MRI ABDOMEN WITHOUT AND WITH CONTRAST (INCLUDING MRCP) TECHNIQUE: Multiplanar multisequence MR imaging of the abdomen was  performed both before and after the administration of intravenous contrast. Heavily T2-weighted images of the biliary and pancreatic ducts were obtained, and three-dimensional MRCP images were rendered by post processing. CONTRAST:  5mL GADAVIST GADOBUTROL 1 MMOL/ML IV SOLN COMPARISON:  None. FINDINGS: Lower chest: Right lower lobe consolidation. Trace right pleural effusion. Minimal left basilar infiltrate or scarring. Moderate hiatal hernia. Cardiac size within normal limits. Hepatobiliary: Tiny cyst noted within the left hepatic lobe. No solid liver mass. Portal vein is patent. Mild intra and moderate extrahepatic biliary ductal dilation. The extrahepatic duct is tortuous, however, there is no intraluminal filling defect identified to suggest choledocholithiasis. Cholecystectomy has been performed. Pancreas: There is a mass within the head of the pancreas measuring 2.4 x 1.6 cm in greatest dimension. This mass is in the region of the ampulla demonstrates mild extrinsic mass effect upon the distal pancreatic and common bile duct at the ampulla. This is best seen on  image 26, series 3 and image 13, series 2. The mass demonstrates mass effect upon the adjacent duodenum, but does not invade the structure. The mass is slightly T1 hypointense and T2 hyperintense in relation to the adjacent pancreatic parenchyma. Differential considerations include a primary pancreatic neoplasm or ampullary neoplasm. The pancreatic duct is not dilated. No pancreatic divisum. The remainder of the pancreas is unremarkable. Spleen:  Unremarkable Adrenals/Urinary Tract: The adrenal glands are unremarkable. Tiny simple cortical cysts are seen within the kidneys bilaterally. The kidneys are otherwise unremarkable. Stomach/Bowel: The visualized bowel is unremarkable. Vascular/Lymphatic: No pathologic lymphadenopathy within the abdomen. Abdominal vasculature is unremarkable. Specifically, the portal vein is patent. Other:  None significant Musculoskeletal: Normal bone marrow signal characteristic. IMPRESSION: Status post cholecystectomy. Dilation of the intra and extrahepatic biliary tree is noted likely representing post cholecystectomy change. No choledocholithiasis. 2.4 cm solid mass within the head of the pancreas demonstrating mild mass effect upon the distal common bile duct and pancreatic duct at the ampulla and mass effect upon the adjacent duodenum. Differential considerations for read by a primary pancreatic neoplasm or ampullary neoplasm. Endoscopic ultrasound may be helpful for tissue sampling. Electronically Signed   By: Helyn Numbers MD   On: 04/08/2020 05:14   DG Chest Port 1 View  Result Date: 03/23/2020 CLINICAL DATA:  Shortness of breath. EXAM: PORTABLE CHEST 1 VIEW COMPARISON:  03/23/2020.  05/28/2019.  CT 01/21/2020. FINDINGS: Mediastinum hilar structures stable. Heart size stable. Persistent bibasilar pulmonary infiltrates. Severe bullous changes consistent with COPD. Surgical sutures noted over the left lung. No prominent pleural effusion. No pneumothorax. Degenerative change  thoracic spine. Old bilateral rib fractures and postsurgical changes of the 6 rib again noted. IMPRESSION: Persistent bibasilar pulmonary infiltrates. No interim change. Severe bullous COPD. Postsurgical changes left lung. Electronically Signed   By: Maisie Fus  Register   On: 03/23/2020 10:29   DG Chest Port 1 View  Result Date: 03/22/2020 CLINICAL DATA:  Shortness of breath. EXAM: PORTABLE CHEST 1 VIEW COMPARISON:  March 21, 2020 FINDINGS: The lungs are hyperinflated. Emphysematous lung disease is seen with mild to moderate severity chronic appearing increased interstitial lung markings. Mild areas of atelectasis and/or infiltrate are seen within the bilateral lung bases. There is no evidence of a pleural effusion or pneumothorax. The heart size and mediastinal contours are within normal limits. Multiple chronic bilateral rib deformities are seen. IMPRESSION: 1. Emphysema and chronic appearing increased interstitial lung markings, with mild bibasilar atelectasis and/or infiltrate. Electronically Signed   By: Aram Candela M.D.   On: 03/22/2020 03:58  DG Foot Complete Right  Result Date: 04/08/2020 CLINICAL DATA:  Evaluate for right foot osteomyelitis. EXAM: RIGHT FOOT COMPLETE - 3+ VIEW COMPARISON:  None. FINDINGS: No fracture or bone lesion. There are no areas of bone resorption to suggest osteomyelitis. Calcaneus shows a Haglund deformity and small plantar and calcaneal spurs. Joints are normally aligned. No soft tissue air. IMPRESSION: 1. No fracture or acute finding.  No evidence of osteomyelitis. Electronically Signed   By: Amie Portland M.D.   On: 04/08/2020 11:20   MR ABDOMEN MRCP W WO CONTAST  Result Date: 04/08/2020 CLINICAL DATA:  Sepsis, biliary ductal dilation. EXAM: MRI ABDOMEN WITHOUT AND WITH CONTRAST (INCLUDING MRCP) TECHNIQUE: Multiplanar multisequence MR imaging of the abdomen was performed both before and after the administration of intravenous contrast. Heavily T2-weighted  images of the biliary and pancreatic ducts were obtained, and three-dimensional MRCP images were rendered by post processing. CONTRAST:  58mL GADAVIST GADOBUTROL 1 MMOL/ML IV SOLN COMPARISON:  None. FINDINGS: Lower chest: Right lower lobe consolidation. Trace right pleural effusion. Minimal left basilar infiltrate or scarring. Moderate hiatal hernia. Cardiac size within normal limits. Hepatobiliary: Tiny cyst noted within the left hepatic lobe. No solid liver mass. Portal vein is patent. Mild intra and moderate extrahepatic biliary ductal dilation. The extrahepatic duct is tortuous, however, there is no intraluminal filling defect identified to suggest choledocholithiasis. Cholecystectomy has been performed. Pancreas: There is a mass within the head of the pancreas measuring 2.4 x 1.6 cm in greatest dimension. This mass is in the region of the ampulla demonstrates mild extrinsic mass effect upon the distal pancreatic and common bile duct at the ampulla. This is best seen on image 26, series 3 and image 13, series 2. The mass demonstrates mass effect upon the adjacent duodenum, but does not invade the structure. The mass is slightly T1 hypointense and T2 hyperintense in relation to the adjacent pancreatic parenchyma. Differential considerations include a primary pancreatic neoplasm or ampullary neoplasm. The pancreatic duct is not dilated. No pancreatic divisum. The remainder of the pancreas is unremarkable. Spleen:  Unremarkable Adrenals/Urinary Tract: The adrenal glands are unremarkable. Tiny simple cortical cysts are seen within the kidneys bilaterally. The kidneys are otherwise unremarkable. Stomach/Bowel: The visualized bowel is unremarkable. Vascular/Lymphatic: No pathologic lymphadenopathy within the abdomen. Abdominal vasculature is unremarkable. Specifically, the portal vein is patent. Other:  None significant Musculoskeletal: Normal bone marrow signal characteristic. IMPRESSION: Status post cholecystectomy.  Dilation of the intra and extrahepatic biliary tree is noted likely representing post cholecystectomy change. No choledocholithiasis. 2.4 cm solid mass within the head of the pancreas demonstrating mild mass effect upon the distal common bile duct and pancreatic duct at the ampulla and mass effect upon the adjacent duodenum. Differential considerations for read by a primary pancreatic neoplasm or ampullary neoplasm. Endoscopic ultrasound may be helpful for tissue sampling. Electronically Signed   By: Helyn Numbers MD   On: 04/08/2020 05:14    ASSESSMENT: Pancreatic mass.  PLAN:    1.  Pancreatic mass: Highly suspicious for underlying malignancy.  MRCP noted 2.4 cm solid mass in the head of the pancreas.  Patient will require EUS as an outpatient to obtain diagnosis.  Given patient's multiple comorbidities this procedure may be difficult.  If diagnosed with pancreatic cancer, is also unclear if she would be able to tolerate treatment.  Will order CA 19-9 for completeness.  Okay to discharge patient from an oncology standpoint and will arrange follow-up after the Thanksgiving holiday to further discuss diagnostic and treatment  options. 2.  Anemia: Patient's hemoglobin has trended down to 8.0.  This has been decreased, but relatively stable over the past several months.  Iron stores, B12, and folate were within normal limits in September 2021.  Continue to monitor and transfuse if hemoglobin falls below 7.0. 3.  Pneumonia: Appears to be improving.  Continue current treatment and antibiotics.  Appreciate consult, will follow.  Jeralyn Ruths, MD   04/09/2020 5:07 PM

## 2020-04-09 NOTE — Progress Notes (Signed)
Arlyss Repress, MD [address]  [address]  [place], [place] [address]  Main: [phone]  Fax: [phone] Pager: [phone]   Subjective: No acute events overnight.  Patient is pleasant and comfortable. Has been afebrile  Objective: Vital signs in last 24 hours: Vitals:   04/09/20 0322 04/09/20 0735 04/09/20 0810 04/09/20 1150  BP: (!) 151/73  139/65 (!) 143/65  Pulse: (!) 103 89 94   Resp: (!) 22  Temp: 98.2 F (36.8 C)  98.4 F (36.9 C) 98.5 F (36.9 C)  TempSrc: Oral  Oral Oral  SpO2: 93% 97% 96% 97%  Weight: 46.6 kg     Height:       Weight change: -4.275 kg  Intake/Output Summary (Last 24 hours) at 04/09/2020 1424 Last data filed at 04/09/2020 1403 Gross per 24 hour  Intake 1809.59 ml  Output 0 ml  Net 1809.59 ml     Exam: Heart:: Regular rate and rhythm, S1S2 present or without murmur or extra heart sounds Lungs: normal and clear to auscultation Abdomen: soft, nontender, normal bowel sounds   Lab Results: @ Micro Results: Recent Results (from the past 240 hour(s))  Respiratory Panel by RT PCR (Flu A&B, Covid) - Nasopharyngeal Swab     Status: None   Collection Time: 04/07/20 11:49 PM   Specimen: Nasopharyngeal Swab  Result Value Ref Range Status   SARS Coronavirus 2 by RT PCR NEGATIVE NEGATIVE Final   Influenza A by PCR NEGATIVE NEGATIVE Final   Influenza B by PCR NEGATIVE NEGATIVE Final    Comment: Performed at [facility], [address] [place], [place] [address]  Blood Culture (routine x 2)     Status: None (Preliminary result)   Collection Time: 04/07/20 11:49 PM   Specimen: BLOOD  Result Value Ref Range Status   Specimen Description BLOOD RIGHT FA  Final   Special Requests   Final    BOTTLES DRAWN AEROBIC AND ANAEROBIC Blood Culture adequate volume   Culture   Final    NO GROWTH 2 DAYS Performed at [facility], [address], [place], [place] [address]    Report Status PENDING   Incomplete  Blood Culture (routine x 2)     Status: None (Preliminary result)   Collection Time: 04/07/20 11:49 PM   Specimen: BLOOD  Result Value Ref Range Status   Specimen Description BLOOD RIGHT FOREARM  Final   Special Requests   Final    BOTTLES DRAWN AEROBIC AND ANAEROBIC Blood Culture adequate volume   Culture   Final    NO GROWTH 1 DAY Performed at [facility], [address], [place], [place] [address]    Report Status PENDING  Incomplete  MRSA PCR Screening     Status: Abnormal   Collection Time: 04/08/20  7:04 PM   Specimen: Nasopharyngeal  Result Value Ref Range Status   MRSA by PCR POSITIVE (A) NEGATIVE Final    Comment:        The GeneXpert MRSA Assay (FDA approved for NASAL specimens only), is one component of a comprehensive MRSA colonization surveillance program. It is not intended to diagnose MRSA infection nor to guide or monitor treatment for MRSA infections. RESULT CALLED TO, READ BACK BY AND VERIFIED WITH: TRACY BENNETT  ON 04/08/20 SKL Performed at [facility], [address] [place], [place] [address]    Studies/Results: [place] Skull 1-3 Views  Result Date: 04/08/2020 CLINICAL DATA:  Initial evaluation for  pre screening for MRI. EXAM: SKULL - 1-3 VIEW COMPARISON:  None. FINDINGS: There is no evidence of skull fracture or other focal bone lesions. No radiopaque foreign body seen overlying the region of either orbit or elsewhere about the skull. Paranasal sinuses are grossly pneumatized. IMPRESSION: Negative. No radiopaque foreign body identified. Electronically Signed   By: Rise Mu M.D.   On: 04/08/2020 04:08   DG Chest 2 View  Result Date: 04/07/2020 CLINICAL DATA:  Chest pain. EXAM: CHEST - 2 VIEW COMPARISON:  Radiographs 03/23/2020 and 03/22/2020. FINDINGS: The heart size and mediastinal contours are stable. There is diffuse aortic atherosclerosis. The lungs are hyperinflated with asymmetric bullous changes  superiorly on the right and bibasilar scarring. No superimposed airspace disease, pleural effusion or pneumothorax identified. There are multiple chronic left-sided rib deformities. No acute osseous findings are seen. IMPRESSION: Chronic obstructive pulmonary disease with bibasilar scarring. No acute cardiopulmonary process. Electronically Signed   By: Carey Bullocks M.D.   On: 04/07/2020 22:04   CT Angio Chest PE W/Cm &/Or Wo Cm  Result Date: 04/08/2020 CLINICAL DATA:  Funny feeling in chest. EXAM: CT ANGIOGRAPHY CHEST WITH CONTRAST TECHNIQUE: Multidetector CT imaging of the chest was performed using the standard protocol during bolus administration of intravenous contrast. Multiplanar CT image reconstructions and MIPs were obtained to evaluate the vascular anatomy. CONTRAST:  OMNIPAQUE IOHEXOL 350 MG/ML SOLN COMPARISON:  01/21/2020 FINDINGS: Cardiovascular: No filling defects in the pulmonary arteries to suggest pulmonary emboli. Heart is normal size. Coronary artery and aortic calcifications. No dissection. Mediastinum/Nodes: No adenopathy in the mediastinum or axilla. Mildly prominent right hilar lymph nodes, likely reactive. Moderate-sized hiatal hernia. Lungs/Pleura: Severe COPD. 15 mm triangular nodule in the right upper lobe is stable since prior study. Consolidation in the right lower lobe compatible with pneumonia. No confluent opacity on the left. Upper Abdomen: Imaging into the upper abdomen demonstrates no acute findings. Musculoskeletal: Chest wall soft tissues are unremarkable. No acute bony abnormality. Review of the MIP images confirms the above findings. IMPRESSION: No evidence of pulmonary embolus. Severe COPD.  Stable 15 mm right upper lobe nodule. Consolidation in the right lower lobe compatible with pneumonia. Moderate-sized hiatal hernia. Aortic Atherosclerosis (ICD10-I70.0) and Emphysema (ICD10-J43.9). Electronically Signed   By: Charlett Nose M.D.   On: 04/08/2020 00:45   CT  Abdomen Pelvis W Contrast  Result Date: 04/08/2020 CLINICAL DATA:  Funny feeling in chest. EXAM: CT ABDOMEN AND PELVIS WITH CONTRAST TECHNIQUE: Multidetector CT imaging of the abdomen and pelvis was performed using the standard protocol following bolus administration of intravenous contrast. CONTRAST:  OMNIPAQUE IOHEXOL 350 MG/ML SOLN COMPARISON:  10/14/2012 FINDINGS: Lower chest: Area consolidation in the right lower lobe concerning for pneumonia. Moderate-sized hiatal hernia. Emphysema. Hepatobiliary: Prior cholecystectomy. Associated biliary ductal dilatation. No focal hepatic abnormality. Pancreas: No focal abnormality or ductal dilatation. Spleen: Scattered punctate calcifications compatible with old granulomatous disease. Normal size. Adrenals/Urinary Tract: No adrenal abnormality. No focal renal abnormality. No stones or hydronephrosis. Urinary bladder is unremarkable. Stomach/Bowel: Large stool burden within the colon. Question fecal impaction within the rectum. No evidence of bowel obstruction. Vascular/Lymphatic: Heavily calcified aorta and iliac vessels. No evidence of aneurysm or adenopathy. Reproductive: Prior hysterectomy.  No adnexal masses. Other: No free fluid or free air. Musculoskeletal: No acute bony abnormality. IMPRESSION: No acute findings in the abdomen or pelvis. Large stool burden throughout the colon, question fecal impaction. Aortic atherosclerosis. Consolidation in the right lower lobe compatible with pneumonia. Electronically Signed   By:  Charlett Nose M.D.   On: 04/08/2020 00:47   MR 3D Recon At Scanner  Result Date: 04/08/2020 CLINICAL DATA:  Sepsis, biliary ductal dilation. EXAM: MRI ABDOMEN WITHOUT AND WITH CONTRAST (INCLUDING MRCP) TECHNIQUE: Multiplanar multisequence MR imaging of the abdomen was performed both before and after the administration of intravenous contrast. Heavily T2-weighted images of the biliary and pancreatic ducts were obtained, and  three-dimensional MRCP images were rendered by post processing. CONTRAST:  27mL GADAVIST GADOBUTROL 1 MMOL/ML IV SOLN COMPARISON:  None. FINDINGS: Lower chest: Right lower lobe consolidation. Trace right pleural effusion. Minimal left basilar infiltrate or scarring. Moderate hiatal hernia. Cardiac size within normal limits. Hepatobiliary: Tiny cyst noted within the left hepatic lobe. No solid liver mass. Portal vein is patent. Mild intra and moderate extrahepatic biliary ductal dilation. The extrahepatic duct is tortuous, however, there is no intraluminal filling defect identified to suggest choledocholithiasis. Cholecystectomy has been performed. Pancreas: There is a mass within the head of the pancreas measuring 2.4 x 1.6 cm in greatest dimension. This mass is in the region of the ampulla demonstrates mild extrinsic mass effect upon the distal pancreatic and common bile duct at the ampulla. This is best seen on image 26, series 3 and image 13, series 2. The mass demonstrates mass effect upon the adjacent duodenum, but does not invade the structure. The mass is slightly T1 hypointense and T2 hyperintense in relation to the adjacent pancreatic parenchyma. Differential considerations include a primary pancreatic neoplasm or ampullary neoplasm. The pancreatic duct is not dilated. No pancreatic divisum. The remainder of the pancreas is unremarkable. Spleen:  Unremarkable Adrenals/Urinary Tract: The adrenal glands are unremarkable. Tiny simple cortical cysts are seen within the kidneys bilaterally. The kidneys are otherwise unremarkable. Stomach/Bowel: The visualized bowel is unremarkable. Vascular/Lymphatic: No pathologic lymphadenopathy within the abdomen. Abdominal vasculature is unremarkable. Specifically, the portal vein is patent. Other:  None significant Musculoskeletal: Normal bone marrow signal characteristic. IMPRESSION: Status post cholecystectomy. Dilation of the intra and extrahepatic biliary tree is noted  likely representing post cholecystectomy change. No choledocholithiasis. 2.4 cm solid mass within the head of the pancreas demonstrating mild mass effect upon the distal common bile duct and pancreatic duct at the ampulla and mass effect upon the adjacent duodenum. Differential considerations for read by a primary pancreatic neoplasm or ampullary neoplasm. Endoscopic ultrasound may be helpful for tissue sampling. Electronically Signed   By: Helyn Numbers MD   On: 04/08/2020 05:14   DG Foot Complete Right  Result Date: 04/08/2020 CLINICAL DATA:  Evaluate for right foot osteomyelitis. EXAM: RIGHT FOOT COMPLETE - 3+ VIEW COMPARISON:  None. FINDINGS: No fracture or bone lesion. There are no areas of bone resorption to suggest osteomyelitis. Calcaneus shows a Haglund deformity and small plantar and calcaneal spurs. Joints are normally aligned. No soft tissue air. IMPRESSION: 1. No fracture or acute finding.  No evidence of osteomyelitis. Electronically Signed   By: Amie Portland M.D.   On: 04/08/2020 11:20   MR ABDOMEN MRCP W WO CONTAST  Result Date: 04/08/2020 CLINICAL DATA:  Sepsis, biliary ductal dilation. EXAM: MRI ABDOMEN WITHOUT AND WITH CONTRAST (INCLUDING MRCP) TECHNIQUE: Multiplanar multisequence MR imaging of the abdomen was performed both before and after the administration of intravenous contrast. Heavily T2-weighted images of the biliary and pancreatic ducts were obtained, and three-dimensional MRCP images were rendered by post processing. CONTRAST:  69mL GADAVIST GADOBUTROL 1 MMOL/ML IV SOLN COMPARISON:  None. FINDINGS: Lower chest: Right lower lobe consolidation. Trace right pleural effusion. Minimal  left basilar infiltrate or scarring. Moderate hiatal hernia. Cardiac size within normal limits. Hepatobiliary: Tiny cyst noted within the left hepatic lobe. No solid liver mass. Portal vein is patent. Mild intra and moderate extrahepatic biliary ductal dilation. The extrahepatic duct is tortuous,  however, there is no intraluminal filling defect identified to suggest choledocholithiasis. Cholecystectomy has been performed. Pancreas: There is a mass within the head of the pancreas measuring 2.4 x 1.6 cm in greatest dimension. This mass is in the region of the ampulla demonstrates mild extrinsic mass effect upon the distal pancreatic and common bile duct at the ampulla. This is best seen on image 26, series 3 and image 13, series 2. The mass demonstrates mass effect upon the adjacent duodenum, but does not invade the structure. The mass is slightly T1 hypointense and T2 hyperintense in relation to the adjacent pancreatic parenchyma. Differential considerations include a primary pancreatic neoplasm or ampullary neoplasm. The pancreatic duct is not dilated. No pancreatic divisum. The remainder of the pancreas is unremarkable. Spleen:  Unremarkable Adrenals/Urinary Tract: The adrenal glands are unremarkable. Tiny simple cortical cysts are seen within the kidneys bilaterally. The kidneys are otherwise unremarkable. Stomach/Bowel: The visualized bowel is unremarkable. Vascular/Lymphatic: No pathologic lymphadenopathy within the abdomen. Abdominal vasculature is unremarkable. Specifically, the portal vein is patent. Other:  None significant Musculoskeletal: Normal bone marrow signal characteristic. IMPRESSION: Status post cholecystectomy. Dilation of the intra and extrahepatic biliary tree is noted likely representing post cholecystectomy change. No choledocholithiasis. 2.4 cm solid mass within the head of the pancreas demonstrating mild mass effect upon the distal common bile duct and pancreatic duct at the ampulla and mass effect upon the adjacent duodenum. Differential considerations for read by a primary pancreatic neoplasm or ampullary neoplasm. Endoscopic ultrasound may be helpful for tissue sampling. Electronically Signed   By: Helyn Numbers MD   On: 04/08/2020 05:14   Medications:  I have reviewed the  patient's current medications. Prior to Admission:  Medications Prior to Admission  Medication Sig Dispense Refill Last Dose  . acetaminophen (TYLENOL) 325 MG tablet Take 2 tablets (650 mg total) by mouth every 6 (six) hours. (Patient taking differently: Take 650 mg by mouth every 6 (six) hours. Not to exceed 4 grams / 24 hours)   unknown at unknown  . acidophilus (RISAQUAD) CAPS capsule Take 1 capsule by mouth 3 (three) times daily.   unknown at unknown  . albuterol (VENTOLIN HFA) 108 (90 Base) MCG/ACT inhaler Inhale 2 puffs into the lungs every 6 (six) hours as needed for wheezing or shortness of breath.    prn at prn  . alendronate (FOSAMAX) 70 MG tablet Take 70 mg by mouth every Monday. Take with a full glass of water on an empty stomach.    unknown at unknown  . aluminum-magnesium hydroxide-simethicone (MAALOX) 200-200-20 MG/5ML SUSP Take 30 mLs by mouth 4 (four) times daily -  before meals and at bedtime.   unknown at unknown  . Amino Acids-Protein Hydrolys (FEEDING SUPPLEMENT, PRO-STAT SUGAR FREE 64,) LIQD Take 30 mLs by mouth 2 (two) times daily.   unknown at unknown  . ascorbic acid (VITAMIN C) 500 MG tablet Take 500 mg by mouth daily.   unknown at unknown  . aspirin EC 81 MG tablet Take 81 mg by mouth daily. Swallow whole.   unknown at unknown  . atorvastatin (LIPITOR) 80 MG tablet Take 1 tablet (80 mg total) by mouth daily.   unknown at unknown  . Calcium Carbonate-Vitamin D3 (CALCIUM 600-D) 600-400  MG-UNIT TABS Take 1 tablet by mouth 2 (two) times daily.   unknown at unknown  . cetirizine (ZYRTEC) 10 MG tablet Take 10 mg by mouth daily.   unknown at unknown  . Cholecalciferol (VITAMIN D) 125 MCG (5000 UT) CAPS Take 5,000 Units by mouth daily.   unknown at unknown  . clopidogrel (PLAVIX) 75 MG tablet Take 1 tablet (75 mg total) by mouth daily.   unknown at unknown  . diclofenac Sodium (VOLTAREN) 1 % GEL Apply 2 g topically 4 (four) times daily.    unknown at unknown  . diltiazem  (CARDIZEM CD) 120 MG 24 hr capsule Take 1 capsule (120 mg total) by mouth daily.   unknown at unknown  . diphenhydrAMINE (BENADRYL) 25 MG tablet Take 25 mg by mouth every 6 (six) hours as needed for allergies.    prn at prn  . docusate sodium (COLACE) 100 MG capsule Take 1 capsule (100 mg total) by mouth 2 (two) times daily. 10 capsule 0 unknown at unknown  . escitalopram (LEXAPRO) 10 MG tablet Take 10 mg by mouth at bedtime.   unknown at unknown  . ferrous sulfate 300 (60 Fe) MG/5ML syrup Take 300 mg by mouth in the morning and at bedtime.    unknown at unknown  . Fluticasone-Salmeterol (ADVAIR) 100-50 MCG/DOSE AEPB Inhale 1 puff into the lungs 2 (two) times daily.   unknown at unknown  . furosemide (LASIX) 20 MG tablet Take 20 mg by mouth daily.   unknown at unknown  . Ipratropium-Albuterol (COMBIVENT RESPIMAT) 20-100 MCG/ACT AERS respimat Inhale 1 puff into the lungs every 6 (six) hours as needed for wheezing.   prn at prn  . loperamide (IMODIUM A-D) 2 MG tablet Take 2 mg by mouth daily as needed for diarrhea or loose stools.   prn at prn  . magnesium hydroxide (MILK OF MAGNESIA) 400 MG/5ML suspension Take 30 mLs by mouth daily as needed for mild constipation.   prn at prn  . metoCLOPramide (REGLAN) 5 MG tablet Take 5 mg by mouth every 8 (eight) hours as needed for nausea. If ondansetron does not work, give reglan.    prn at prn  . metoprolol tartrate (LOPRESSOR) 25 MG tablet Take 6.25 mg by mouth 2 (two) times daily.   unknown at unknown  . nitroGLYCERIN (NITROSTAT) 0.4 MG SL tablet Place 1 tablet (0.4 mg total) under the tongue every 5 (five) minutes as needed for chest pain.  12 prn at prn  . nystatin cream (MYCOSTATIN) Apply 1 application topically 2 (two) times daily.   unknown at unknown  . omeprazole (PRILOSEC) 40 MG capsule Take 40 mg by mouth daily.   unknown at unknown  . ondansetron (ZOFRAN) 4 MG tablet Take 1 tablet (4 mg total) by mouth every 6 (six) hours as needed for nausea. 20  tablet 0 prn at prn  . potassium chloride SA (KLOR-CON) 20 MEQ tablet Take 40 mEq by mouth daily.   unknown at unknown  . sodium chloride 0.9 % infusion Inject 500 mLs into the vein 3 (three) times daily. 60 ml/ hour   unknown at unknown  . traMADol (ULTRAM) 50 MG tablet Take 50 mg by mouth every 8 (eight) hours.   unknown at unknown  . feeding supplement, ENSURE ENLIVE, (ENSURE ENLIVE) LIQD Take 237 mLs by mouth 3 (three) times daily between meals. 237 mL 12   . OXYGEN Inhale 2 L/min into the lungs continuous.       Scheduled: .  Chlorhexidine Gluconate Cloth  6 each Topical Q0600  . enoxaparin (LOVENOX) injection  40 mg Subcutaneous Q24H  . feeding supplement  237 mL Oral BID BM  . guaiFENesin  600 mg Oral BID  . ipratropium-albuterol  3 mL Nebulization BID  . [START ON 04/10/2020] multivitamin with minerals  1 tablet Oral Daily  . mupirocin ointment  1 application Nasal BID  . potassium chloride  40 mEq Oral Daily   Continuous: . sodium chloride 50 mL/hr at 04/09/20 1024  . azithromycin 500 mg (04/09/20 0228)  . cefTRIAXone (ROCEPHIN)  IV 2 g (04/09/20 0434)   UVO:[id number]:acetaminophen **OR** acetaminophen, albuterol, magnesium hydroxide, ondansetron **OR** ondansetron (ZOFRAN) IV, traZODone Anti-infectives (From admission, onward)   Start     Dose/Rate Route Frequency Ordered Stop   04/08/20 1200  cefTRIAXone (ROCEPHIN) 2 g in sodium chloride 0.9 % 100 mL IVPB  Status:  Discontinued        2 g 200 mL/hr over 30 Minutes Intravenous Every 24 hours 04/08/20 0308 04/08/20 0526   04/08/20 0525  cefTRIAXone (ROCEPHIN) 2 g in sodium chloride 0.9 % 100 mL IVPB        2 g 200 mL/hr over 30 Minutes Intravenous Every 24 hours 04/08/20 0526     04/08/20 0315  azithromycin (ZITHROMAX) 500 mg in sodium chloride 0.9 % 250 mL IVPB        500 mg 250 mL/hr over 60 Minutes Intravenous Every 24 hours 04/08/20 0308     04/08/20 0230  vancomycin (VANCOCIN) IVPB 1000 mg/200 mL premix  Status:  Discontinued         1,000 mg 200 mL/hr over 60 Minutes Intravenous  Once 04/08/20 0224 04/08/20 0525   04/08/20 0230  ceFEPIme (MAXIPIME) 2 g in sodium chloride 0.9 % 100 mL IVPB        2 g 200 mL/hr over 30 Minutes Intravenous  Once 04/08/20 0224 04/08/20 0321     Scheduled Meds: . Chlorhexidine Gluconate Cloth  6 each Topical Q0600  . enoxaparin (LOVENOX) injection  40 mg Subcutaneous Q24H  . feeding supplement  237 mL Oral BID BM  . guaiFENesin  600 mg Oral BID  . ipratropium-albuterol  3 mL Nebulization BID  . [START ON 04/10/2020] multivitamin with minerals  1 tablet Oral Daily  . mupirocin ointment  1 application Nasal BID  . potassium chloride  40 mEq Oral Daily   Continuous Infusions: . sodium chloride 50 mL/hr at 04/09/20 1024  . azithromycin 500 mg (04/09/20 0228)  . 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 Servando SnareWohl as outpatient for biliary stent placement if needed  GI will sign off at this time, please call us back with questions or concerns Dr. Norma Fredricksonoledo is covering for the weekend    LOS: 1 day   Tian Davison 04/09/2020, 2:24 PM

## 2020-04-09 NOTE — Progress Notes (Signed)
Pt c/o right shoulder pain. Pt communicated that she fell at nursing home prior to admission. MD made aware and new orders for xray of right shoulder. Will continue to monitor.

## 2020-04-09 NOTE — Progress Notes (Signed)
PROGRESS NOTE   Alice Wallace  RDE:[id number] DOB: Mar 27, 1941 DOA: 04/07/2020 PCP: [name], MD  Brief Narrative:  74 white female COPD 4 L at rest, HTN, osteoporosis deaf-mute Coronavirus infection 10/23/1495 complicated by readmission 06/07/2019 with severe bullous emphysema Prior NSTEMI 01/21/2020 and the setting of right proximal femoral fracture status post repair requiring high flow nasal cannula during that admission Chronic HFpEF Sacral decubitus with bleeding from buttocks with recurrent sepsis pneumonia on admission 10/31 through 03/25/2020 Paroxysmal A. fib  Admit acute onset midline chest pain, heart rate 114 pulse ox 89% on 4 L Alk phos 396 AST/ALT 141/133 lactic acid 1.1 WBC 13.3 CXR COPD bibasilar scarring CT abdomen pelvis no PE but 15 mm right upper lobe nodule and consolidation right lower lobe  Started broad-spectrum cefepime azithromycin for pneumonia.   Given elevated LFTs MRCP performed as below which shows 2.4 cm solid mass head of pancreas and mass-effect on CBD pancreatic duct---  GI Dr. [name] consulted regarding the same     Assessment & Plan:   Active Problems:   CAP (community acquired pneumonia)   1. Community-acquired pneumonia with recent admission for the same 1031 through 11 4 a. Recurrent pneumonia we will ask speech therapy not emergently to see this might be related to aspiration b. Transition azithromycin ceftriaxone to Levaquin can complete 5 days total of antibiotics ending 11/21 c. Chest x-ray in 3 weeks as outpatient d. Saline lock 11/19 2. 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 Transaminitis resolving as below trends b. Gastroenterology does not feel any emergent need for EUS but may need oncology follow-up as an outpatient and did consult Dr. [name] 11/18 c. Repeat LFTs in the outpatient setting 3. Mild hypokalemia a. Replace potassium with K. Dur 40 daily b. As magnesium is slightly low 1.3 will give 1 g IV today and  recheck 4. Stage II-III COPD on 4 L at rest a. Continue albuterol 2.5 every 4 as needed DuoNeb twice daily b. Add Spiriva but does not need steroids given no active wheezing 5. HTN a. Holding Coreg at this time and resume 6.25 low-dose in the outpatient setting 6. Prior NSTEMI 01/21/2020 7. HFpEF compensated with no acute findings 8. Paroxysmal A. Fib a. As above continue Cardizem 120, resumed Plavix 75, aspirin 81 daily 9. Prior femoral fracture 10. Recent admission sacral decubitus, right heel ulcer 11. BMI 17 a. Patient severely debilitated with multiple areas of bedsores-some have bleeding-needs alginate dressings cover with foam change every 3 days b. X-ray of heel did not show cellulitis or osteomyelitis c. Supportive management turn every 2 hours offloading boot etc.  DVT prophylaxis: Lovenox Code Status: Full Family Communication: Discussed with Mr. Kevin Fenton as patient is a ward of the state his phone [phone] have explained patient will need x-rays and imaging studies in the outpatient setting and work-up for mass Disposition:  Status is: Inpatient  Remains inpatient appropriate because:Hemodynamically unstable, Ongoing diagnostic testing needed not appropriate for outpatient work up and Unsafe d/c plan   Dispo: The patient is from: SNF              Anticipated d/c is to: SNF              Anticipated d/c date is: 1 day              Patient currently is not medically stable to d/c.       Consultants:   GI  Procedures: Multiple  Antimicrobials: Ceftriaxone azithromycin--->and Levaquin  Subjective: Awake alert coherent Difficult to comprehend given communication barriers Not reporting any significant issues today seems to be in good spirits  Objective: Vitals:   04/09/20 0322 04/09/20 0735 04/09/20 0810 04/09/20 1150  BP: (!) 151/73  139/65 (!) 143/65  Pulse: (!) 103 89 94   Resp: 20 14 20  (!) 22  Temp: 98.2 F (36.8 C)  98.4 F (36.9 C) 98.5 F  (36.9 C)  TempSrc: Oral  Oral Oral  SpO2: 93% 97% 96% 97%  Weight: 46.6 kg     Height:        Intake/Output Summary (Last 24 hours) at 04/09/2020 1443 Last data filed at 04/09/2020 1403 Gross per 24 hour  Intake 1809.59 ml  Output 0 ml  Net 1809.59 ml   Filed Weights   04/07/20 2139 04/08/20 0935 04/09/20 0322  Weight: 49 kg 44.7 kg 46.6 kg    Examination:  EOMI NCAT wearing oxygen CTA B no added sound Mepilex dressing on back Mepilex dressing on foot and on sacral area Sacrum was examined stage full-thickness with some amount of bleeding fluid yesterday  moving all 4 limbs equally   Data Reviewed: I have personally reviewed following labs and imaging studies Potassium 3.1 Alk phos 396->320 AST/ALT 141/133->68/85 Bilirubin 0.8 Procalcitonin 0.4   Radiology Studies: DG Skull 1-3 Views  Result Date: 04/08/2020 CLINICAL DATA:  Initial evaluation for pre screening for MRI. EXAM: SKULL - 1-3 VIEW COMPARISON:  None. FINDINGS: There is no evidence of skull fracture or other focal bone lesions. No radiopaque foreign body seen overlying the region of either orbit or elsewhere about the skull. Paranasal sinuses are grossly pneumatized. IMPRESSION: Negative. No radiopaque foreign body identified. Electronically Signed   By: Jeannine Boga M.D.   On: 04/08/2020 04:08   DG Chest 2 View  Result Date: 04/07/2020 CLINICAL DATA:  Chest pain. EXAM: CHEST - 2 VIEW COMPARISON:  Radiographs 03/23/2020 and 03/22/2020. FINDINGS: The heart size and mediastinal contours are stable. There is diffuse aortic atherosclerosis. The lungs are hyperinflated with asymmetric bullous changes superiorly on the right and bibasilar scarring. No superimposed airspace disease, pleural effusion or pneumothorax identified. There are multiple chronic left-sided rib deformities. No acute osseous findings are seen. IMPRESSION: Chronic obstructive pulmonary disease with bibasilar scarring. No acute  cardiopulmonary process. Electronically Signed   By: Richardean Sale M.D.   On: 04/07/2020 22:04   CT Angio Chest PE W/Cm &/Or Wo Cm  Result Date: 04/08/2020 CLINICAL DATA:  Funny feeling in chest. EXAM: CT ANGIOGRAPHY CHEST WITH CONTRAST TECHNIQUE: Multidetector CT imaging of the chest was performed using the standard protocol during bolus administration of intravenous contrast. Multiplanar CT image reconstructions and MIPs were obtained to evaluate the vascular anatomy. CONTRAST:  163m OMNIPAQUE IOHEXOL 350 MG/ML SOLN COMPARISON:  01/21/2020 FINDINGS: Cardiovascular: No filling defects in the pulmonary arteries to suggest pulmonary emboli. Heart is normal size. Coronary artery and aortic calcifications. No dissection. Mediastinum/Nodes: No adenopathy in the mediastinum or axilla. Mildly prominent right hilar lymph nodes, likely reactive. Moderate-sized hiatal hernia. Lungs/Pleura: Severe COPD. 15 mm triangular nodule in the right upper lobe is stable since prior study. Consolidation in the right lower lobe compatible with pneumonia. No confluent opacity on the left. Upper Abdomen: Imaging into the upper abdomen demonstrates no acute findings. Musculoskeletal: Chest wall soft tissues are unremarkable. No acute bony abnormality. Review of the MIP images confirms the above findings. IMPRESSION: No evidence of pulmonary embolus. Severe COPD.  Stable 15 mm right upper lobe nodule.  Consolidation in the right lower lobe compatible with pneumonia. Moderate-sized hiatal hernia. Aortic Atherosclerosis (ICD10-I70.0) and Emphysema (ICD10-J43.9). Electronically Signed   By: Rolm Baptise M.D.   On: 04/08/2020 00:45   CT Abdomen Pelvis W Contrast  Result Date: 04/08/2020 CLINICAL DATA:  Funny feeling in chest. EXAM: CT ABDOMEN AND PELVIS WITH CONTRAST TECHNIQUE: Multidetector CT imaging of the abdomen and pelvis was performed using the standard protocol following bolus administration of intravenous contrast. CONTRAST:   192m OMNIPAQUE IOHEXOL 350 MG/ML SOLN COMPARISON:  10/14/2012 FINDINGS: Lower chest: Area consolidation in the right lower lobe concerning for pneumonia. Moderate-sized hiatal hernia. Emphysema. Hepatobiliary: Prior cholecystectomy. Associated biliary ductal dilatation. No focal hepatic abnormality. Pancreas: No focal abnormality or ductal dilatation. Spleen: Scattered punctate calcifications compatible with old granulomatous disease. Normal size. Adrenals/Urinary Tract: No adrenal abnormality. No focal renal abnormality. No stones or hydronephrosis. Urinary bladder is unremarkable. Stomach/Bowel: Large stool burden within the colon. Question fecal impaction within the rectum. No evidence of bowel obstruction. Vascular/Lymphatic: Heavily calcified aorta and iliac vessels. No evidence of aneurysm or adenopathy. Reproductive: Prior hysterectomy.  No adnexal masses. Other: No free fluid or free air. Musculoskeletal: No acute bony abnormality. IMPRESSION: No acute findings in the abdomen or pelvis. Large stool burden throughout the colon, question fecal impaction. Aortic atherosclerosis. Consolidation in the right lower lobe compatible with pneumonia. Electronically Signed   By: KRolm BaptiseM.D.   On: 04/08/2020 00:47   MR 3D Recon At Scanner  Result Date: 04/08/2020 CLINICAL DATA:  Sepsis, biliary ductal dilation. EXAM: MRI ABDOMEN WITHOUT AND WITH CONTRAST (INCLUDING MRCP) TECHNIQUE: Multiplanar multisequence MR imaging of the abdomen was performed both before and after the administration of intravenous contrast. Heavily T2-weighted images of the biliary and pancreatic ducts were obtained, and three-dimensional MRCP images were rendered by post processing. CONTRAST:  521mGADAVIST GADOBUTROL 1 MMOL/ML IV SOLN COMPARISON:  None. FINDINGS: Lower chest: Right lower lobe consolidation. Trace right pleural effusion. Minimal left basilar infiltrate or scarring. Moderate hiatal hernia. Cardiac size within normal  limits. Hepatobiliary: Tiny cyst noted within the left hepatic lobe. No solid liver mass. Portal vein is patent. Mild intra and moderate extrahepatic biliary ductal dilation. The extrahepatic duct is tortuous, however, there is no intraluminal filling defect identified to suggest choledocholithiasis. Cholecystectomy has been performed. Pancreas: There is a mass within the head of the pancreas measuring 2.4 x 1.6 cm in greatest dimension. This mass is in the region of the ampulla demonstrates mild extrinsic mass effect upon the distal pancreatic and common bile duct at the ampulla. This is best seen on image 26, series 3 and image 13, series 2. The mass demonstrates mass effect upon the adjacent duodenum, but does not invade the structure. The mass is slightly T1 hypointense and T2 hyperintense in relation to the adjacent pancreatic parenchyma. Differential considerations include a primary pancreatic neoplasm or ampullary neoplasm. The pancreatic duct is not dilated. No pancreatic divisum. The remainder of the pancreas is unremarkable. Spleen:  Unremarkable Adrenals/Urinary Tract: The adrenal glands are unremarkable. Tiny simple cortical cysts are seen within the kidneys bilaterally. The kidneys are otherwise unremarkable. Stomach/Bowel: The visualized bowel is unremarkable. Vascular/Lymphatic: No pathologic lymphadenopathy within the abdomen. Abdominal vasculature is unremarkable. Specifically, the portal vein is patent. Other:  None significant Musculoskeletal: Normal bone marrow signal characteristic. IMPRESSION: Status post cholecystectomy. Dilation of the intra and extrahepatic biliary tree is noted likely representing post cholecystectomy change. No choledocholithiasis. 2.4 cm solid mass within the head of the pancreas demonstrating  mild mass effect upon the distal common bile duct and pancreatic duct at the ampulla and mass effect upon the adjacent duodenum. Differential considerations for read by a primary  pancreatic neoplasm or ampullary neoplasm. Endoscopic ultrasound may be helpful for tissue sampling. Electronically Signed   By: Fidela Salisbury MD   On: 04/08/2020 05:14   DG Foot Complete Right  Result Date: 04/08/2020 CLINICAL DATA:  Evaluate for right foot osteomyelitis. EXAM: RIGHT FOOT COMPLETE - 3+ VIEW COMPARISON:  None. FINDINGS: No fracture or bone lesion. There are no areas of bone resorption to suggest osteomyelitis. Calcaneus shows a Haglund deformity and small plantar and calcaneal spurs. Joints are normally aligned. No soft tissue air. IMPRESSION: 1. No fracture or acute finding.  No evidence of osteomyelitis. Electronically Signed   By: Lajean Manes M.D.   On: 04/08/2020 11:20   MR ABDOMEN MRCP W WO CONTAST  Result Date: 04/08/2020 CLINICAL DATA:  Sepsis, biliary ductal dilation. EXAM: MRI ABDOMEN WITHOUT AND WITH CONTRAST (INCLUDING MRCP) TECHNIQUE: Multiplanar multisequence MR imaging of the abdomen was performed both before and after the administration of intravenous contrast. Heavily T2-weighted images of the biliary and pancreatic ducts were obtained, and three-dimensional MRCP images were rendered by post processing. CONTRAST:  54m GADAVIST GADOBUTROL 1 MMOL/ML IV SOLN COMPARISON:  None. FINDINGS: Lower chest: Right lower lobe consolidation. Trace right pleural effusion. Minimal left basilar infiltrate or scarring. Moderate hiatal hernia. Cardiac size within normal limits. Hepatobiliary: Tiny cyst noted within the left hepatic lobe. No solid liver mass. Portal vein is patent. Mild intra and moderate extrahepatic biliary ductal dilation. The extrahepatic duct is tortuous, however, there is no intraluminal filling defect identified to suggest choledocholithiasis. Cholecystectomy has been performed. Pancreas: There is a mass within the head of the pancreas measuring 2.4 x 1.6 cm in greatest dimension. This mass is in the region of the ampulla demonstrates mild extrinsic mass effect upon  the distal pancreatic and common bile duct at the ampulla. This is best seen on image 26, series 3 and image 13, series 2. The mass demonstrates mass effect upon the adjacent duodenum, but does not invade the structure. The mass is slightly T1 hypointense and T2 hyperintense in relation to the adjacent pancreatic parenchyma. Differential considerations include a primary pancreatic neoplasm or ampullary neoplasm. The pancreatic duct is not dilated. No pancreatic divisum. The remainder of the pancreas is unremarkable. Spleen:  Unremarkable Adrenals/Urinary Tract: The adrenal glands are unremarkable. Tiny simple cortical cysts are seen within the kidneys bilaterally. The kidneys are otherwise unremarkable. Stomach/Bowel: The visualized bowel is unremarkable. Vascular/Lymphatic: No pathologic lymphadenopathy within the abdomen. Abdominal vasculature is unremarkable. Specifically, the portal vein is patent. Other:  None significant Musculoskeletal: Normal bone marrow signal characteristic. IMPRESSION: Status post cholecystectomy. Dilation of the intra and extrahepatic biliary tree is noted likely representing post cholecystectomy change. No choledocholithiasis. 2.4 cm solid mass within the head of the pancreas demonstrating mild mass effect upon the distal common bile duct and pancreatic duct at the ampulla and mass effect upon the adjacent duodenum. Differential considerations for read by a primary pancreatic neoplasm or ampullary neoplasm. Endoscopic ultrasound may be helpful for tissue sampling. Electronically Signed   By: AFidela SalisburyMD   On: 04/08/2020 05:14     Scheduled Meds: . Chlorhexidine Gluconate Cloth  6 each Topical Q0600  . enoxaparin (LOVENOX) injection  40 mg Subcutaneous Q24H  . feeding supplement  237 mL Oral BID BM  . guaiFENesin  600 mg Oral BID  .  ipratropium-albuterol  3 mL Nebulization BID  . [START ON 04/10/2020] multivitamin with minerals  1 tablet Oral Daily  . mupirocin ointment   1 application Nasal BID  . potassium chloride  40 mEq Oral Daily   Continuous Infusions: . sodium chloride 50 mL/hr at 04/09/20 1024  . azithromycin 500 mg (04/09/20 0228)  . cefTRIAXone (ROCEPHIN)  IV 2 g (04/09/20 0434)     LOS: 1 day    Time spent: [place], MD [facility] To contact the attending provider between 7A-7P or the covering provider during after hours 7P-7A, please log into the web site [email or web address] and access using universal [place] password for that web site. If you do not have the password, please call the hospital operator.  04/09/2020, 2:43 PM

## 2020-04-09 NOTE — Progress Notes (Signed)
Initial Nutrition Assessment  DOCUMENTATION CODES:   Severe malnutrition in context of chronic illness  INTERVENTION:   Ensure Enlive po BID, each supplement provides 350 kcal and 20 grams of protein  MVI daily   Pt at high refeed risk; recommend monitor potassium, magnesium and phosphorus labs daily until stable  NUTRITION DIAGNOSIS:   Severe Malnutrition related to chronic illness (COPD, CHF) as evidenced by severe fat depletion, severe muscle depletion, 21 percent weight loss 10 months.  GOAL:   Patient will meet greater than or equal to 90% of their needs  MONITOR:   PO intake, Supplement acceptance, Labs, Weight trends, Skin  REASON FOR ASSESSMENT:   Other (Comment) (Low BMI)    ASSESSMENT:   79 y.o. female with history of COPD on 4 L and oxygen, hypertension, osteoporosis, deaf and mute, Covid infection in Jan 8242 complicated by severe bullous emphysema, history of NSTEMI in 01/2020, right proximal femoral fracture s/p repair, dependent on ADLs, paroxysmal A. fib, on Plavix is admitted with sepsis secondary to right lower lobe pneumonia, incidentally found to have elevated LFTs with biliary dilation.  MRCP revealed presence of 2.4 cm solid lesion in head of the pancreas demonstrating mass-effect upon the distal CBD and pancreatic duct and mass-effect on adjacent duodenum representing primary pancreatic neoplasm or ampullary neoplasm  Met with pt in room today. Pt is deaf and mute so RD communicated with pt via writing on a pad. Pt reports fair appetite and oral intake pta and in hospital. Pt documented to be eating sips and bites of meals yesterday but RN reports that patient ate a little more for breakfast this morning. Pt reports that she is willing to drink Ensure supplements. RD will add supplements and MVI to help pt meet her estimated needs. Pt is likely at refeed risk. Per chart, pt is down 27lbs(21%) over the past 10 months; this is significant.   Medications  reviewed and include: lovenox, KCl, NaCl $RemoveB'@50ml'vmyXAuBT$ /hr, azithromycin, ceftriaxone, Mg sulfate   Labs reviewed: K 3.1(L), creat 0.30(L), Mg 1.3(L), alk phos 329(H), AST 68(H), ALT 85(H) Hgb 8.0(L), Hct 25.3(L)  NUTRITION - FOCUSED PHYSICAL EXAM:    Most Recent Value  Orbital Region Severe depletion  Upper Arm Region Severe depletion  Thoracic and Lumbar Region Severe depletion  Buccal Region Severe depletion  Temple Region Severe depletion  Clavicle Bone Region Severe depletion  Clavicle and Acromion Bone Region Severe depletion  Scapular Bone Region Severe depletion  Dorsal Hand Severe depletion  Patellar Region Severe depletion  Anterior Thigh Region Severe depletion  Posterior Calf Region Severe depletion  Edema (RD Assessment) Moderate  Hair Reviewed  Eyes Reviewed  Mouth Reviewed  Skin Reviewed  Nails Reviewed     Diet Order:   Diet Order            DIET SOFT Room service appropriate? Yes; Fluid consistency: Thin  Diet effective now                EDUCATION NEEDS:   Education needs have been addressed  Skin:  Skin Assessment: Reviewed RN Assessment (MASD wounds to buttocks and perineum, Right heel:  1 cm x 0.3 cm scabbed lesion)  Last BM:  11/19- TYPE 3  Height:   Ht Readings from Last 1 Encounters:  04/08/20 $RemoveB'5\' 5"'cNmceroS$  (1.651 m)    Weight:   Wt Readings from Last 1 Encounters:  04/09/20 46.6 kg    Ideal Body Weight:  56.8 kg  BMI:  Body mass index is  17.11 kg/m.  Estimated Nutritional Needs:   Kcal:  1300-1500kcal/day  Protein:  65-75g/day  Fluid:  >1.3L/day  Koleen Distance MS, RD, LDN Please refer to [facility] for RD and/or RD on-call/weekend/after hours pager

## 2020-04-09 NOTE — Progress Notes (Signed)
rn notified about pt c/o recent fall and R should pain Patient seen again-ROM limited to ext rotation, flexion and abduction Some point tenderness to acromion  Will get complete Jay Schlichter to r/o fracture  Pleas Koch, MD [facility] 4:53 PM

## 2020-04-10 LAB — COMPREHENSIVE METABOLIC PANEL
ALT: 75 U/L — ABNORMAL HIGH (ref 0–44)
AST: 59 U/L — ABNORMAL HIGH (ref 15–41)
Albumin: 2.5 g/dL — ABNORMAL LOW (ref 3.5–5.0)
Alkaline Phosphatase: 375 U/L — ABNORMAL HIGH (ref 38–126)
Anion gap: 7 (ref 5–15)
BUN: 7 mg/dL — ABNORMAL LOW (ref 8–23)
CO2: 25 mmol/L (ref 22–32)
Calcium: 8.2 mg/dL — ABNORMAL LOW (ref 8.9–10.3)
Chloride: 103 mmol/L (ref 98–111)
Creatinine, Ser: 0.36 mg/dL — ABNORMAL LOW (ref 0.44–1.00)
GFR, Estimated: >60 mL/min (ref >60)
Glucose, Bld: 80 mg/dL (ref 70–99)
Potassium: 3.6 mmol/L (ref 3.5–5.1)
Sodium: 135 mmol/L (ref 135–145)
Total Bilirubin: 0.9 mg/dL (ref 0.3–1.2)
Total Protein: 5.9 g/dL — ABNORMAL LOW (ref 6.5–8.1)

## 2020-04-10 LAB — CBC WITH DIFFERENTIAL/PLATELET
Abs Immature Granulocytes: 0.02 10*3/uL (ref 0.00–0.07)
Basophils Absolute: 0 10*3/uL (ref 0.0–0.1)
Basophils Relative: 0 %
Eosinophils Absolute: 0.2 10*3/uL (ref 0.0–0.5)
Eosinophils Relative: 3 %
HCT: 29.1 % — ABNORMAL LOW (ref 36.0–46.0)
Hemoglobin: 9.2 g/dL — ABNORMAL LOW (ref 12.0–15.0)
Immature Granulocytes: 0 %
Lymphocytes Relative: 13 %
Lymphs Abs: 0.9 10*3/uL (ref 0.7–4.0)
MCH: 27.5 pg (ref 26.0–34.0)
MCHC: 31.6 g/dL (ref 30.0–36.0)
MCV: 86.9 fL (ref 80.0–100.0)
Monocytes Absolute: 0.4 10*3/uL (ref 0.1–1.0)
Monocytes Relative: 6 %
Neutro Abs: 5.3 10*3/uL (ref 1.7–7.7)
Neutrophils Relative %: 78 %
Platelets: 244 10*3/uL (ref 150–400)
RBC: 3.35 MIL/uL — ABNORMAL LOW (ref 3.87–5.11)
RDW: 17.4 % — ABNORMAL HIGH (ref 11.5–15.5)
WBC: 6.9 10*3/uL (ref 4.0–10.5)
nRBC: 0 % (ref 0.0–0.2)

## 2020-04-10 LAB — SARS CORONAVIRUS 2 BY RT PCR (HOSPITAL ORDER, PERFORMED IN ~~LOC~~ HOSPITAL LAB): SARS Coronavirus 2: NEGATIVE

## 2020-04-10 MED ORDER — TIOTROPIUM BROMIDE MONOHYDRATE 18 MCG IN CAPS: 18.0000 ug | ORAL_CAPSULE | Freq: Every day | RESPIRATORY_TRACT | 12 refills | Status: AC

## 2020-04-10 MED ORDER — CHLORHEXIDINE GLUCONATE CLOTH 2 % EX PADS
6.0000 | MEDICATED_PAD | Freq: Every day | CUTANEOUS | Status: DC
  Administered 2020-04-10: 6 via TOPICAL

## 2020-04-10 MED ORDER — TRAMADOL HCL 50 MG PO TABS: 50.0000 mg | ORAL_TABLET | Freq: Three times a day (TID) | ORAL | 0 refills | Status: AC

## 2020-04-10 MED ORDER — LEVOFLOXACIN 750 MG PO TABS: 750.0000 mg | ORAL_TABLET | Freq: Every day | ORAL | 0 refills | Status: AC

## 2020-04-10 NOTE — Discharge Summary (Addendum)
Physician Discharge Summary  Alice Wallace XLK:[id number] DOB: 08/05/1940 DOA: 04/07/2020  PCP: [name], MD  Admit date: 04/07/2020 Discharge date: 04/10/2020  Time spent: 27 minutes  Recommendations for Outpatient Follow-up:  1. Needs Chem-12, CBC in about 1 week--- check outpatient magnesium as was replaced IV during hospital stay 2. Recommend outpatient coordination with Dr. [name] regarding outpatient EUS and with Dr. [name] regarding possible treatment of pancreatic mass/cancer-follow CA 19-9 3. Does require frequent turning and wound care instructions for sacral decubiti in addition to attention with dressing changes to right lower extremity on Heel Place, Prevalon boots on that area 4. Continue chronic oxygen 5. Complete Levaquin on 11/22 for HCAP  Discharge Diagnoses:  Active Problems:   CAP (community acquired pneumonia)   Protein-calorie malnutrition, severe   Pressure injury of skin   Discharge Condition: Improved  Diet recommendation: Heart healthy Diet Recommendation  Minced foods diet w/ Gravies added to moisten well, added Purees(dysphagia level 2); Thin liquids VIA CUP ONLY.  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 fib  Admit acute onset midline chest pain, heart  rate 114 pulse ox 89% on 4 L Alk phos 396 AST/ALT 141/133 lactic acid 1.1 WBC 13.3 CXR COPD bibasilar scarring CT abdomen pelvis no PE but 15 mm right upper lobe nodule and consolidation right lower lobe  Started broad-spectrum cefepime azithromycin for pneumonia.  Given elevated LFTs MRCPperformed as below which shows 2.4 cm solid mass head of pancreas and mass-effect on CBD pancreatic duct--- GI Dr. [name] consulted regarding the same   Hospital Course:  1. Community-acquired pneumonia with recent admission for the same 1031 through 11 4 a. Recurrent pneumonia-speech therapy saw the patient recommended as above-diet collection b. Transition azithromycin ceftriaxone to Levaquin can complete 5 days total of antibiotics ending 11/22 c. Chest x-ray in 3 weeks as outpatient d. Saline lock 11/19 2. 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 Transaminitis so during hospital stay b. Gastroenterology does not feel any emergent need for EUS but may need oncology follow-up as an outpatient and did consult Dr. [name] 11/18 c. Repeat LFTs in the outpatient setting 3. Mild hypokalemia a. Replace potassium with K. Dur 40 daily b. Recheck magnesium as an outpatient 4. Right shoulder pain 1. X-ray done this admission did not confirm any fracture she does have probable restricted ROM from adhesive capsulitis and will need outpatient OT, and work with her at her facility 5. Stage II-III COPD on 4 L at rest a. Continue albuterol 2.5 every 4 as needed DuoNeb twice daily b. Add Spiriva but does not need steroids given no active wheezing 6. HTN a. Holding Coreg at this time and resume 6.25 low-dose in the outpatient setting 7. Prior NSTEMI 01/21/2020 8. HFpEF compensated with no acute findings 9.  10. Paroxysmal A. Fib a. As above continue Cardizem 120, resumed Plavix 75, aspirin 81 daily 11.  Prior femoral fracture 12. Recent admission sacral decubitus, right heel ulcer 13. BMI  17 a. Patient severely debilitated with multiple areas of bedsores-some have bleeding-needs alginate dressings cover with foam change every 3 days b. X-ray of heel did not show cellulitis or osteomyelitis c. Supportive management turn every 2 hours offloading boot etc.  Consultations:  Gastroenterology  Oncology  Discharge Exam: Vitals:   04/10/20 0740 04/10/20 1114  BP: 135/70 128/63  Pulse: 92 100  Resp: 20 20  Temp: 98.5 F (36.9 C) 98.4 F (36.9 C)  SpO2: 96% 95%    General: Awake alert coherent very hard to understand however did have conversation using pending paper with her Seems well eating full meal and her breakfast plate is empty Cardiovascular: S1-S2 no murmur no rub no gallop seems to be in A. fib Respiratory: Clinically clear no rales no rhonchi no added sound Abdomen soft nontender no rebound no guarding  Discharge Instructions   Discharge Instructions    Diet - low sodium heart healthy   Complete by: As directed    Discharge wound care:   Complete by: As directed    04/08/20 1310   Wound care  Daily      Comments: Cleanse wounds to buttock, heel and sacrum with NS and pat dry. Apply alginate dressing and cover with foam Change every three days and PRN soilage.  Will implement low air loss bed and heel boots.   Increase activity slowly   Complete by: As directed      Allergies as of 04/10/2020      Reactions   Hydrocodone    Other reaction(s): Unknown   Naproxen    Other reaction(s): Unknown   Other Other (See Comments)   nuts   Sulfamethoxazole-trimethoprim    Other reaction(s): Unknown   Tramadol    Other reaction(s): Unknown Pt has tolerated this med 1/5-1/7 with no issues      Medication List    STOP taking these medications   Calcium 600-D 600-400 MG-UNIT Tabs Generic drug: Calcium Carbonate-Vitamin D3   cetirizine 10 MG tablet Commonly known as: ZYRTEC   diclofenac Sodium 1 % Gel Commonly known as: VOLTAREN   diphenhydrAMINE  25 MG tablet Commonly known as: BENADRYL   furosemide 20 MG tablet Commonly known as: LASIX   sodium chloride 0.9 % infusion     TAKE these medications   acetaminophen 325 MG tablet Commonly known as: TYLENOL Take 2 tablets (650 mg total) by mouth every 6 (six) hours. What changed: additional instructions   acidophilus Caps capsule Take 1 capsule by mouth 3 (three) times daily.   albuterol 108 (90 Base) MCG/ACT inhaler Commonly known as: VENTOLIN HFA Inhale 2 puffs into the lungs every 6 (six) hours as needed for wheezing or shortness of breath.   alendronate 70 MG tablet Commonly known as: FOSAMAX Take 70 mg by mouth every Monday. Take with a full glass of water on an empty stomach.   aluminum-magnesium hydroxide-simethicone 644-034-74 MG/5ML Susp Commonly known as: MAALOX Take 30 mLs by mouth 4 (four) times daily -  before meals and at bedtime.   ascorbic acid 500 MG tablet Commonly known as: VITAMIN C Take 500 mg by mouth daily.   aspirin EC 81 MG tablet Take 81 mg by mouth daily. Swallow whole.   atorvastatin 80 MG tablet Commonly known as: LIPITOR Take 1 tablet (80 mg total) by mouth daily.   clopidogrel 75 MG tablet Commonly known as: PLAVIX Take 1 tablet (75  mg total) by mouth daily.   Combivent Respimat 20-100 MCG/ACT Aers respimat Generic drug: Ipratropium-Albuterol Inhale 1 puff into the lungs every 6 (six) hours as needed for wheezing.   diltiazem 120 MG 24 hr capsule Commonly known as: CARDIZEM CD Take 1 capsule (120 mg total) by mouth daily.   docusate sodium 100 MG capsule Commonly known as: COLACE Take 1 capsule (100 mg total) by mouth 2 (two) times daily.   escitalopram 10 MG tablet Commonly known as: LEXAPRO Take 10 mg by mouth at bedtime.   feeding supplement (PRO-STAT SUGAR FREE 64) Liqd Take 30 mLs by mouth 2 (two) times daily.   feeding supplement Liqd Take 237 mLs by mouth 3 (three) times daily between meals.   ferrous sulfate  300 (60 Fe) MG/5ML syrup Take 300 mg by mouth in the morning and at bedtime.   Fluticasone-Salmeterol 100-50 MCG/DOSE Aepb Commonly known as: ADVAIR Inhale 1 puff into the lungs 2 (two) times daily.   levofloxacin 750 MG tablet Commonly known as: LEVAQUIN Take 1 tablet (750 mg total) by mouth daily. Start taking on: April 11, 2020   loperamide 2 MG tablet Commonly known as: IMODIUM A-D Take 2 mg by mouth daily as needed for diarrhea or loose stools.   magnesium hydroxide 400 MG/5ML suspension Commonly known as: MILK OF MAGNESIA Take 30 mLs by mouth daily as needed for mild constipation.   metoCLOPramide 5 MG tablet Commonly known as: REGLAN Take 5 mg by mouth every 8 (eight) hours as needed for nausea. If ondansetron does not work, give reglan.   metoprolol tartrate 25 MG tablet Commonly known as: LOPRESSOR Take 6.25 mg by mouth 2 (two) times daily.   nitroGLYCERIN 0.4 MG SL tablet Commonly known as: NITROSTAT Place 1 tablet (0.4 mg total) under the tongue every 5 (five) minutes as needed for chest pain.   nystatin cream Commonly known as: MYCOSTATIN Apply 1 application topically 2 (two) times daily.   omeprazole 40 MG capsule Commonly known as: PRILOSEC Take 40 mg by mouth daily.   ondansetron 4 MG tablet Commonly known as: ZOFRAN Take 1 tablet (4 mg total) by mouth every 6 (six) hours as needed for nausea.   OXYGEN Inhale 2 L/min into the lungs continuous.   potassium chloride SA 20 MEQ tablet Commonly known as: KLOR-CON Take 40 mEq by mouth daily.   tiotropium 18 MCG inhalation capsule Commonly known as: SPIRIVA Place 1 capsule (18 mcg total) into inhaler and inhale daily. Start taking on: April 11, 2020   traMADol 50 MG tablet Commonly known as: ULTRAM Take 1 tablet (50 mg total) by mouth every 8 (eight) hours.   Vitamin D 125 MCG (5000 UT) Caps Take 5,000 Units by mouth daily.            Discharge Care Instructions  (From admission,  onward)         Start     Ordered   04/10/20 0000  Discharge wound care:       Comments: 04/08/20 1310   Wound care  Daily      Comments: Cleanse wounds to buttock, heel and sacrum with NS and pat dry. Apply alginate dressing and cover with foam Change every three days and PRN soilage.  Will implement low air loss bed and heel boots.   04/10/20 1203         Allergies  Allergen Reactions  . Hydrocodone     Other reaction(s): Unknown  . Naproxen  Other reaction(s): Unknown  . Other Other (See Comments)    nuts  . Sulfamethoxazole-Trimethoprim     Other reaction(s): Unknown  . Tramadol     Other reaction(s): Unknown Pt has tolerated this med 1/5-1/7 with no issues      The results of significant diagnostics from this hospitalization (including imaging, microbiology, ancillary and laboratory) are listed below for reference.    Significant Diagnostic Studies: EEG  Result Date: 03/24/2020 Lora Havens, MD     03/24/2020 10:26 AM Patient Name: Alice Wallace MRN: [id number] Epilepsy Attending: Lora Havens Referring Physician/Provider: Dr Karie Kirks Date: 03/24/2020 Duration: 22.49 mins Patient history: 78yo F with ams. EEG to evaluate for seizure Level of alertness:  lethargic AEDs during EEG study: None Technical aspects: This EEG study was done with scalp electrodes positioned according to the 10-20 International system of electrode placement. Electrical activity was acquired at a sampling rate of 500Hz  and reviewed with a high frequency filter of 70Hz  and a low frequency filter of 1Hz . EEG data were recorded continuously and digitally stored. Description: No clear posterior dominant rhythm was seen. EEG showed continuous generalized 2 to 5 Hz theta-delta slowing. Physiologic photic driving was not seen during photic stimulation.  Hyperventilation was not performed.   ABNORMALITY -Continuous slow, generalized IMPRESSION: This study is suggestive of moderate diffuse  encephalopathy, nonspecific etiology. No seizures or epileptiform discharges were seen throughout the recording. Lora Havens   DG Skull 1-3 Views  Result Date: 04/08/2020 CLINICAL DATA:  Initial evaluation for pre screening for MRI. EXAM: SKULL - 1-3 VIEW COMPARISON:  None. FINDINGS: There is no evidence of skull fracture or other focal bone lesions. No radiopaque foreign body seen overlying the region of either orbit or elsewhere about the skull. Paranasal sinuses are grossly pneumatized. IMPRESSION: Negative. No radiopaque foreign body identified. Electronically Signed   By: Jeannine Boga M.D.   On: 04/08/2020 04:08   DG Chest 1 View  Result Date: 03/23/2020 CLINICAL DATA:  Shortness of breath EXAM: CHEST  1 VIEW COMPARISON:  03/22/2020 FINDINGS: Hyperinflation of the lungs. Bilateral lower lobe airspace opacities are again noted, unchanged. Heart is normal size. Old bilateral rib fractures. IMPRESSION: COPD/chronic changes. Bilateral lower lobe airspace opacities, stable. Cannot exclude pneumonia. Electronically Signed   By: Rolm Baptise M.D.   On: 03/23/2020 02:54   DG Chest 2 View  Result Date: 04/07/2020 CLINICAL DATA:  Chest pain. EXAM: CHEST - 2 VIEW COMPARISON:  Radiographs 03/23/2020 and 03/22/2020. FINDINGS: The heart size and mediastinal contours are stable. There is diffuse aortic atherosclerosis. The lungs are hyperinflated with asymmetric bullous changes superiorly on the right and bibasilar scarring. No superimposed airspace disease, pleural effusion or pneumothorax identified. There are multiple chronic left-sided rib deformities. No acute osseous findings are seen. IMPRESSION: Chronic obstructive pulmonary disease with bibasilar scarring. No acute cardiopulmonary process. Electronically Signed   By: Richardean Sale M.D.   On: 04/07/2020 22:04   DG Chest 2 View  Result Date: 03/21/2020 CLINICAL DATA:  Shortness of breath. EXAM: CHEST - 2 VIEW COMPARISON:  January 30, 2020 FINDINGS: Upper limits of normal cardiac silhouette. Calcific atherosclerotic disease and tortuosity of the aorta. Severe upper lobe predominant emphysematous changes of the lungs are chronic. Patchy airspace consolidation versus atelectasis in bilateral lower lobes. IMPRESSION: 1. Patchy airspace consolidation versus atelectasis in bilateral lower lobes. 2. 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 Electronically Signed   By: Fidela Salisbury M.D.   On:  03/21/2020 17:59   DG Shoulder Right  Result Date: 04/09/2020 CLINICAL DATA:  Right acromial fracture EXAM: RIGHT SHOULDER - 2+ VIEW COMPARISON:  01/31/2017 FINDINGS: Multiple old healed right rib fractures noted. Degenerative changes in the right AC joint and glenohumeral joint. No visible fracture, subluxation or dislocation. IMPRESSION: No visible acute bony abnormality. Electronically Signed   By: Rolm Baptise M.D.   On: 04/09/2020 19:04   CT HEAD WO CONTRAST  Result Date: 03/22/2020 CLINICAL DATA:  Mental status change. EXAM: CT HEAD WITHOUT CONTRAST TECHNIQUE: Contiguous axial images were obtained from the base of the skull through the vertex without intravenous contrast. COMPARISON:  01/17/2019 FINDINGS: Brain: There is no evidence of an acute infarct, intracranial hemorrhage, mass, midline shift, or extra-axial fluid collection. The ventricles and sulci are within normal limits for age. Patchy hypodensities in the cerebral white matter bilaterally are unchanged and nonspecific but compatible with moderate chronic small vessel ischemic disease. Vascular: Calcified atherosclerosis at the skull base. No hyperdense vessel. Skull: No acute fracture or suspicious osseous lesion. Sinuses/Orbits: Moderate volume fluid in the right maxillary sinus. No significant mastoid fluid. Bilateral cataract extraction. Other: None. IMPRESSION: 1. No evidence of acute intracranial abnormality. 2. Moderate chronic small vessel ischemic  disease. 3. Right maxillary sinus fluid, correlate for acute sinusitis. Electronically Signed   By: Logan Bores M.D.   On: 03/22/2020 14:55   CT Angio Chest PE W/Cm &/Or Wo Cm  Result Date: 04/08/2020 CLINICAL DATA:  Funny feeling in chest. EXAM: CT ANGIOGRAPHY CHEST WITH CONTRAST TECHNIQUE: Multidetector CT imaging of the chest was performed using the standard protocol during bolus administration of intravenous contrast. Multiplanar CT image reconstructions and MIPs were obtained to evaluate the vascular anatomy. CONTRAST:  137m OMNIPAQUE IOHEXOL 350 MG/ML SOLN COMPARISON:  01/21/2020 FINDINGS: Cardiovascular: No filling defects in the pulmonary arteries to suggest pulmonary emboli. Heart is normal size. Coronary artery and aortic calcifications. No dissection. Mediastinum/Nodes: No adenopathy in the mediastinum or axilla. Mildly prominent right hilar lymph nodes, likely reactive. Moderate-sized hiatal hernia. Lungs/Pleura: Severe COPD. 15 mm triangular nodule in the right upper lobe is stable since prior study. Consolidation in the right lower lobe compatible with pneumonia. No confluent opacity on the left. Upper Abdomen: Imaging into the upper abdomen demonstrates no acute findings. Musculoskeletal: Chest wall soft tissues are unremarkable. No acute bony abnormality. Review of the MIP images confirms the above findings. IMPRESSION: No evidence of pulmonary embolus. Severe COPD.  Stable 15 mm right upper lobe nodule. Consolidation in the right lower lobe compatible with pneumonia. Moderate-sized hiatal hernia. Aortic Atherosclerosis (ICD10-I70.0) and Emphysema (ICD10-J43.9). Electronically Signed   By: KRolm BaptiseM.D.   On: 04/08/2020 00:45   CT Abdomen Pelvis W Contrast  Result Date: 04/08/2020 CLINICAL DATA:  Funny feeling in chest. EXAM: CT ABDOMEN AND PELVIS WITH CONTRAST TECHNIQUE: Multidetector CT imaging of the abdomen and pelvis was performed using the standard protocol following bolus  administration of intravenous contrast. CONTRAST:  1071mOMNIPAQUE IOHEXOL 350 MG/ML SOLN COMPARISON:  10/14/2012 FINDINGS: Lower chest: Area consolidation in the right lower lobe concerning for pneumonia. Moderate-sized hiatal hernia. Emphysema. Hepatobiliary: Prior cholecystectomy. Associated biliary ductal dilatation. No focal hepatic abnormality. Pancreas: No focal abnormality or ductal dilatation. Spleen: Scattered punctate calcifications compatible with old granulomatous disease. Normal size. Adrenals/Urinary Tract: No adrenal abnormality. No focal renal abnormality. No stones or hydronephrosis. Urinary bladder is unremarkable. Stomach/Bowel: Large stool burden within the colon. Question fecal impaction within the rectum. No evidence of bowel obstruction.  Vascular/Lymphatic: Heavily calcified aorta and iliac vessels. No evidence of aneurysm or adenopathy. Reproductive: Prior hysterectomy.  No adnexal masses. Other: No free fluid or free air. Musculoskeletal: No acute bony abnormality. IMPRESSION: No acute findings in the abdomen or pelvis. Large stool burden throughout the colon, question fecal impaction. Aortic atherosclerosis. Consolidation in the right lower lobe compatible with pneumonia. Electronically Signed   By: Rolm Baptise M.D.   On: 04/08/2020 00:47   MR 3D Recon At Scanner  Result Date: 04/08/2020 CLINICAL DATA:  Sepsis, biliary ductal dilation. EXAM: MRI ABDOMEN WITHOUT AND WITH CONTRAST (INCLUDING MRCP) TECHNIQUE: Multiplanar multisequence MR imaging of the abdomen was performed both before and after the administration of intravenous contrast. Heavily T2-weighted images of the biliary and pancreatic ducts were obtained, and three-dimensional MRCP images were rendered by post processing. CONTRAST:  26m GADAVIST GADOBUTROL 1 MMOL/ML IV SOLN COMPARISON:  None. FINDINGS: Lower chest: Right lower lobe consolidation. Trace right pleural effusion. Minimal left basilar infiltrate or scarring.  Moderate hiatal hernia. Cardiac size within normal limits. Hepatobiliary: Tiny cyst noted within the left hepatic lobe. No solid liver mass. Portal vein is patent. Mild intra and moderate extrahepatic biliary ductal dilation. The extrahepatic duct is tortuous, however, there is no intraluminal filling defect identified to suggest choledocholithiasis. Cholecystectomy has been performed. Pancreas: There is a mass within the head of the pancreas measuring 2.4 x 1.6 cm in greatest dimension. This mass is in the region of the ampulla demonstrates mild extrinsic mass effect upon the distal pancreatic and common bile duct at the ampulla. This is best seen on image 26, series 3 and image 13, series 2. The mass demonstrates mass effect upon the adjacent duodenum, but does not invade the structure. The mass is slightly T1 hypointense and T2 hyperintense in relation to the adjacent pancreatic parenchyma. Differential considerations include a primary pancreatic neoplasm or ampullary neoplasm. The pancreatic duct is not dilated. No pancreatic divisum. The remainder of the pancreas is unremarkable. Spleen:  Unremarkable Adrenals/Urinary Tract: The adrenal glands are unremarkable. Tiny simple cortical cysts are seen within the kidneys bilaterally. The kidneys are otherwise unremarkable. Stomach/Bowel: The visualized bowel is unremarkable. Vascular/Lymphatic: No pathologic lymphadenopathy within the abdomen. Abdominal vasculature is unremarkable. Specifically, the portal vein is patent. Other:  None significant Musculoskeletal: Normal bone marrow signal characteristic. IMPRESSION: Status post cholecystectomy. Dilation of the intra and extrahepatic biliary tree is noted likely representing post cholecystectomy change. No choledocholithiasis. 2.4 cm solid mass within the head of the pancreas demonstrating mild mass effect upon the distal common bile duct and pancreatic duct at the ampulla and mass effect upon the adjacent duodenum.  Differential considerations for read by a primary pancreatic neoplasm or ampullary neoplasm. Endoscopic ultrasound may be helpful for tissue sampling. Electronically Signed   By: AFidela SalisburyMD   On: 04/08/2020 05:14   DG Chest Port 1 View  Result Date: 03/23/2020 CLINICAL DATA:  Shortness of breath. EXAM: PORTABLE CHEST 1 VIEW COMPARISON:  03/23/2020.  05/28/2019.  CT 01/21/2020. FINDINGS: Mediastinum hilar structures stable. Heart size stable. Persistent bibasilar pulmonary infiltrates. Severe bullous changes consistent with COPD. Surgical sutures noted over the left lung. No prominent pleural effusion. No pneumothorax. Degenerative change thoracic spine. Old bilateral rib fractures and postsurgical changes of the 6 rib again noted. IMPRESSION: Persistent bibasilar pulmonary infiltrates. No interim change. Severe bullous COPD. Postsurgical changes left lung. Electronically Signed   By: TMarcello Moores Register   On: 03/23/2020 10:29   DG Chest Port 1 V15 [address]  Result Date: 03/22/2020 CLINICAL DATA:  Shortness of breath. EXAM: PORTABLE CHEST 1 VIEW COMPARISON:  March 21, 2020 FINDINGS: The lungs are hyperinflated. Emphysematous lung disease is seen with mild to moderate severity chronic appearing increased interstitial lung markings. Mild areas of atelectasis and/or infiltrate are seen within the bilateral lung bases. There is no evidence of a pleural effusion or pneumothorax. The heart size and mediastinal contours are within normal limits. Multiple chronic bilateral rib deformities are seen. IMPRESSION: 1. Emphysema and chronic appearing increased interstitial lung markings, with mild bibasilar atelectasis and/or infiltrate. Electronically Signed   By: Virgina Norfolk M.D.   On: 03/22/2020 03:58   DG Foot Complete Right  Result Date: 04/08/2020 CLINICAL DATA:  Evaluate for right foot osteomyelitis. EXAM: RIGHT FOOT COMPLETE - 3+ VIEW COMPARISON:  None. FINDINGS: No fracture or bone lesion. There are no  areas of bone resorption to suggest osteomyelitis. Calcaneus shows a Haglund deformity and small plantar and calcaneal spurs. Joints are normally aligned. No soft tissue air. IMPRESSION: 1. No fracture or acute finding.  No evidence of osteomyelitis. Electronically Signed   By: Lajean Manes M.D.   On: 04/08/2020 11:20   MR ABDOMEN MRCP W WO CONTAST  Result Date: 04/08/2020 CLINICAL DATA:  Sepsis, biliary ductal dilation. EXAM: MRI ABDOMEN WITHOUT AND WITH CONTRAST (INCLUDING MRCP) TECHNIQUE: Multiplanar multisequence MR imaging of the abdomen was performed both before and after the administration of intravenous contrast. Heavily T2-weighted images of the biliary and pancreatic ducts were obtained, and three-dimensional MRCP images were rendered by post processing. CONTRAST:  33m GADAVIST GADOBUTROL 1 MMOL/ML IV SOLN COMPARISON:  None. FINDINGS: Lower chest: Right lower lobe consolidation. Trace right pleural effusion. Minimal left basilar infiltrate or scarring. Moderate hiatal hernia. Cardiac size within normal limits. Hepatobiliary: Tiny cyst noted within the left hepatic lobe. No solid liver mass. Portal vein is patent. Mild intra and moderate extrahepatic biliary ductal dilation. The extrahepatic duct is tortuous, however, there is no intraluminal filling defect identified to suggest choledocholithiasis. Cholecystectomy has been performed. Pancreas: There is a mass within the head of the pancreas measuring 2.4 x 1.6 cm in greatest dimension. This mass is in the region of the ampulla demonstrates mild extrinsic mass effect upon the distal pancreatic and common bile duct at the ampulla. This is best seen on image 26, series 3 and image 13, series 2. The mass demonstrates mass effect upon the adjacent duodenum, but does not invade the structure. The mass is slightly T1 hypointense and T2 hyperintense in relation to the adjacent pancreatic parenchyma. Differential considerations include a primary pancreatic  neoplasm or ampullary neoplasm. The pancreatic duct is not dilated. No pancreatic divisum. The remainder of the pancreas is unremarkable. Spleen:  Unremarkable Adrenals/Urinary Tract: The adrenal glands are unremarkable. Tiny simple cortical cysts are seen within the kidneys bilaterally. The kidneys are otherwise unremarkable. Stomach/Bowel: The visualized bowel is unremarkable. Vascular/Lymphatic: No pathologic lymphadenopathy within the abdomen. Abdominal vasculature is unremarkable. Specifically, the portal vein is patent. Other:  None significant Musculoskeletal: Normal bone marrow signal characteristic. IMPRESSION: Status post cholecystectomy. Dilation of the intra and extrahepatic biliary tree is noted likely representing post cholecystectomy change. No choledocholithiasis. 2.4 cm solid mass within the head of the pancreas demonstrating mild mass effect upon the distal common bile duct and pancreatic duct at the ampulla and mass effect upon the adjacent duodenum. Differential considerations for read by a primary pancreatic neoplasm or ampullary neoplasm. Endoscopic ultrasound may be helpful for tissue sampling. Electronically Signed  By: Fidela Salisbury MD   On: 04/08/2020 05:14    Microbiology: Recent Results (from the past 240 hour(s))  Respiratory Panel by RT PCR (Flu A&B, Covid) - Nasopharyngeal Swab     Status: None   Collection Time: 04/07/20 11:49 PM   Specimen: Nasopharyngeal Swab  Result Value Ref Range Status   SARS Coronavirus 2 by RT PCR NEGATIVE NEGATIVE Final   Influenza A by PCR NEGATIVE NEGATIVE Final   Influenza B by PCR NEGATIVE NEGATIVE Final    Comment: Performed at [facility], [address] [place], [place] [address]  Blood Culture (routine x 2)     Status: None (Preliminary result)   Collection Time: 04/07/20 11:49 PM   Specimen: BLOOD  Result Value Ref Range Status   Specimen Description BLOOD RIGHT FA  Final   Special Requests   Final    BOTTLES DRAWN  AEROBIC AND ANAEROBIC Blood Culture adequate volume   Culture   Final    NO GROWTH 3 DAYS Performed at [facility], [address], [place], [place] [address]    Report Status PENDING  Incomplete  Blood Culture (routine x 2)     Status: None (Preliminary result)   Collection Time: 04/07/20 11:49 PM   Specimen: BLOOD  Result Value Ref Range Status   Specimen Description BLOOD RIGHT FOREARM  Final   Special Requests   Final    BOTTLES DRAWN AEROBIC AND ANAEROBIC Blood Culture adequate volume   Culture   Final    NO GROWTH 2 DAYS Performed at [facility], [address], [place], [place] [address]    Report Status PENDING  Incomplete  MRSA PCR Screening     Status: Abnormal   Collection Time: 04/08/20  7:04 PM   Specimen: Nasopharyngeal  Result Value Ref Range Status   MRSA by PCR POSITIVE (A) NEGATIVE Final    Comment:        The GeneXpert MRSA Assay (FDA approved for NASAL specimens only), is one component of a comprehensive MRSA colonization surveillance program. It is not intended to diagnose MRSA infection nor to guide or monitor treatment for MRSA infections. RESULT CALLED TO, READ BACK BY AND VERIFIED WITH: TRACY BENNETT @2129  ON 04/08/20 SKL Performed at [facility], [address] [place], [place] [address]      Labs: Basic Metabolic Panel: Recent Labs  Lab 04/07/20 2140 04/08/20 0531 04/09/20 0530 04/10/20 0354  NA 134* 136 135 135  K 3.6 4.0 3.1* 3.6  CL 95* 98 99 103  CO2 26 27 23 25   GLUCOSE 98 100* 76 80  BUN 15 14 8  7*  CREATININE 0.47 0.49 <0.30* 0.36*  CALCIUM 8.3* 8.6* 8.1* 8.2*  MG  --   --  1.3*  --    Liver Function Tests: Recent Labs  Lab 04/07/20 2140 04/09/20 0530 04/10/20 0354  AST 141* 68* 59*  ALT 133* 85* 75*  ALKPHOS 396* 329* 375*  BILITOT 0.9 0.8 0.9  PROT 6.4* 5.4* 5.9*  ALBUMIN 2.7* 2.3* 2.5*   Recent Labs  Lab 04/07/20 2140  LIPASE 17   No results for input(s): AMMONIA in the  last 168 hours. CBC: Recent Labs  Lab 04/07/20 2140 04/08/20 0531 04/09/20 0530 04/10/20 0354  WBC 13.3* 10.1 7.1 6.9  NEUTROABS 10.8*  --  6.1 5.3  HGB 9.8* 8.8* 8.0* 9.2*  HCT 31.3* 28.2* 25.3* 29.1*  MCV 87.7 88.7 87.2 86.9  PLT 320 231 202 244  Cardiac Enzymes: No results for input(s): CKTOTAL, CKMB, CKMBINDEX, TROPONINI in the last 168 hours. BNP: BNP (last 3 results) Recent Labs    01/24/20 0427 01/29/20 1029 03/21/20 1720  BNP 797.4* 2,160.7* 383.4*    ProBNP (last 3 results) No results for input(s): PROBNP in the last 8760 hours.  CBG: No results for input(s): GLUCAP in the last 168 hours.     Signed:  Nita Sells MD   [facility] 04/10/2020, 12:04 PM

## 2020-04-10 NOTE — Progress Notes (Signed)
Patient discharging to [facility] today. Report called to [place]. EMS to arrive around 1615 per CSW. Packet prepared by CSW. AVS, prescriptions and DNR added to packet. IV and telemetry to be removed prior to discharge.

## 2020-04-10 NOTE — Evaluation (Addendum)
Clinical/Bedside Swallow Evaluation Patient Details  Name: Alice Wallace MRN: [id number] Date of Birth: 12/31/1940  Today's Date: 04/10/2020 Time: SLP Start Time (ACUTE ONLY): 0830 SLP Stop Time (ACUTE ONLY): 0925 SLP Time Calculation (min) (ACUTE ONLY): 55 min  Past Medical History:  Past Medical History:  Diagnosis Date  . COPD (chronic obstructive pulmonary disease) ([place])   . Deaf   . Hypertension   . Osteoporosis    Past Surgical History:  Past Surgical History:  Procedure Laterality Date  . INTRAMEDULLARY (IM) NAIL INTERTROCHANTERIC Right 01/26/2020   Procedure: Right Hip IM nail with TFNA;  Surgeon: Lovell Sheehan, MD;  Location: [facility] ORS;  Service: Orthopedics;  Laterality: Right;   HPI:  Pt is a 79 y.o. female who resides at NH with PMH of COPD on 4L, diastolic CHF, deaf mutism, chronic anemia, and chronic hypoxic respiratory failure on home O2 at 4 L/min, grade 1 diastolic dysfunction, last EF 60 to 65% September 2021 who presents to the ED for chest pain.  Pt was recently admitted here in 02/2020.  CT Angio of Chest: "Severe COPD. 15 mm triangular nodule in the right upper lobe is stable since prior study. Consolidation in the right lower lobe compatible w/ pneumonia. Moderate-sized hiatal hernia.".  Per [place] MD: "pt complains of abdominal pain and persistent cough. MRCP completed on April 08, 2020 revealed a 2.4 x 1.6 cm mass in the head of the pancreas, highly suspicious for underlying malignancy. No liver lesions were noted".    Assessment / Plan / Recommendation Clinical Impression  Met w/ pt in room for BSE. Pt has a Baseline congested cough w/ a R lobe mass (chronic) and min underlying PNA. Also noted she has a Mod Hiatal Hernia. Suspect her Hernia and baseline pulmonary status impact her desire and Stamina to eat much. She does not eat much Solid foods per her report. She wears full Dentures. Pt appears to present w/ grossly adequate oropharyngeal phase swallow  w/ No overt oropharyngeal phase dysphagia noted, No overt neuromuscular deficits noted.Pt consumed po trials w/ No immediate, overt clinical s/s of aspiration during po trials; a mild congested cough noted x1 post multiple sips but no decline in respiratory presentation. Pt appears at reduced risk for oropharyngeal phase aspiration following general aspiration precautions. However, ANY Esophageal phase dysmotility such as from Mod size Hiatal Hernia could increase risk for Regurgitation and aspiration of Reflux material thus impact Pulmonary status.  During po trials, pt consumed all consistencies w/ no immediate, overt coughing, decline in vocal quality, or change in respiratory presentation during/post trials. Oral phase appeared [facility] w/ timely bolus management, mastication, and control of bolus propulsion for A-P transfer for swallowing; min extra time needed for mastication and clearing of increased textured trials(3) -- pt declined further solid foods trials and pointed to applesauce nodding head for "more". Oral clearing achieved w/ all trial consistencies. OM Exam appeared [facility] w/ no unilateral weakness noted. Pt fed self w/ setup support.  Recommend a Minced foods consistency diet w/ Gravies to moistened foods AND added Purees(for Conservation of Energy); Thin liquidsVIA CUP. STRICT REFLUX PRECAUTIONS recommended w/ general aspiration precautions, Pills WHOLE in Puree for safer, easier swallowing w/ Esophageal phase. Education given on Pills in Puree; food consistencies; general aspiration precautions handout given. ST services will monitor status and toleration of diet while admitted. MD/NSG updated and agreed.   SLP Visit Diagnosis: Dysphagia, unspecified (R13.10)    Aspiration Risk  Mild aspiration risk;Risk for inadequate nutrition/hydration (Esophageal  phase; reduced from oropharyngeal)    Diet Recommendation  Minced foods diet w/ Gravies added to moisten well, added Purees(dysphagia level 2);  Thin liquids VIA CUP ONLY.  STRICT REFLUX PRECAUTIONS; general aspiration precautions; tray setup at meals  Medication Administration: Whole meds with puree (for easier, safer swallowing)    Other  Recommendations Recommended Consults: Consider GI evaluation;Consider esophageal assessment (Mod. Hiatal Hernia) Oral Care Recommendations: Oral care BID;Oral care before and after PO;Staff/trained caregiver to provide oral care (Denture care) Other Recommendations:  (n/a)   Follow up Recommendations None (TBD)      Frequency and Duration min 1 x/week  1 week       Prognosis Prognosis for Safe Diet Advancement: Fair (-Good) Barriers to Reach Goals: Time post onset;Severity of deficits (Esophageal phase )      Swallow Study   General Date of Onset: 04/07/20 HPI: Pt is a 79 y.o. female who resides at NH with PMH of COPD on 4L, diastolic CHF, deaf mutism, chronic anemia, and chronic hypoxic respiratory failure on home O2 at 4 L/min, grade 1 diastolic dysfunction, last EF 60 to 65% September 2021 who presents to the ED for chest pain.  Pt was recently admitted here in 02/2020.  CT Angio of Chest: "Severe COPD. 15 mm triangular nodule in the right upper lobe is stable since prior study. Consolidation in the right lower lobe compatible w/ pneumonia. Moderate-sized hiatal hernia.".  Per [place] MD: "pt complains of abdominal pain and persistent cough. MRCP completed on April 08, 2020 revealed a 2.4 x 1.6 cm mass in the head of the pancreas, highly suspicious for underlying malignancy. No liver lesions were noted".  Type of Study: Bedside Swallow Evaluation Previous Swallow Assessment: none Diet Prior to this Study: Regular;Thin liquids Temperature Spikes Noted: No (wbc 6.9) Respiratory Status: Nasal cannula (3L) History of Recent Intubation: No Behavior/Cognition: Alert;Cooperative;Pleasant mood (deaf) Oral Cavity Assessment: Within Functional Limits Oral Care Completed by SLP: Yes Oral  Cavity - Dentition: Dentures, top;Dentures, bottom Vision: Functional for self-feeding Self-Feeding Abilities: Able to feed self;Needs assist;Needs set up Patient Positioning: Upright in bed (needed positioning) Baseline Vocal Quality:  (n/a) Volitional Cough: Strong;Congested Volitional Swallow: Able to elicit    Oral/Motor/Sensory Function Overall Oral Motor/Sensory Function: Within functional limits   Ice Chips Ice chips: Within functional limits Presentation: Spoon (2 trials)   Thin Liquid Thin Liquid: Within functional limits Presentation: Cup;Self Fed (8 trials, then 4 ozs ) Other Comments: 1 mild congested cough noted b/t trials post multiple sips/~2 ozs    Nectar Thick Nectar Thick Liquid: Not tested   Honey Thick Honey Thick Liquid: Not tested   Puree Puree: Within functional limits Presentation: Self Fed;Spoon (~4 ozs)   Solid     Solid: Impaired Presentation: Self Fed;Spoon (3 trials then refused more) Oral Phase Impairments:  (min munching mastication pattern) Oral Phase Functional Implications:  (min time to clear) Pharyngeal Phase Impairments:  (none)        Orinda Kenner, MS, CCC-SLP Speech Language Pathologist Rehab Services [phone] Jahlia Omura 04/10/2020,10:24 AM

## 2020-04-12 LAB — CULTURE, BLOOD (ROUTINE X 2)
Culture: NO GROWTH
Special Requests: ADEQUATE

## 2020-04-12 LAB — CANCER ANTIGEN 19-9: CA 19-9: 63 U/mL — ABNORMAL HIGH (ref 0–35)

## 2020-04-13 LAB — CULTURE, BLOOD (ROUTINE X 2)
Culture: NO GROWTH
Special Requests: ADEQUATE

## 2020-05-05 ENCOUNTER — Non-Acute Institutional Stay: Payer: Medicare Other | Admitting: Primary Care

## 2020-05-05 ENCOUNTER — Other Ambulatory Visit: Payer: Self-pay

## 2020-05-05 DIAGNOSIS — Z515 Encounter for palliative care: Secondary | ICD-10-CM

## 2020-05-05 DIAGNOSIS — S72044A Nondisplaced fracture of base of neck of right femur, initial encounter for closed fracture: Secondary | ICD-10-CM

## 2020-05-05 DIAGNOSIS — J441 Chronic obstructive pulmonary disease with (acute) exacerbation: Secondary | ICD-10-CM

## 2020-05-05 NOTE — Progress Notes (Signed)
[facility]er, [facility] Palliative Care Consult Note Telephone: [phone]  Fax: [phone]     Date of encounter: 05/05/20 PATIENT NAME: Alice Wallace [address] [address] [phone] (home)  DOB: August 02, 1940 MRN: [id number]  PRIMARY CARE PROVIDER:    [name], MD,  [place] [address] [phone]  REFERRING PROVIDER:   Rica Wallace, [place] [place],  [place] [address] [phone]  RESPONSIBLE PARTY:   Extended Emergency Contact Information Primary Emergency Contact: Alice Wallace Work Phone: [phone] Mobile Phone: [phone] Relation: Legal Guardian Secondary Emergency Contact: Alice Wallace Mobile Phone: [phone] Relation: Other  I met face to face with patient in facility. Palliative Care was asked to follow this patient by consultation request of Alice Koyanagi, MD to help address advance care planning and goals of care. This is a follow up visit.  ASSESSMENT AND RECOMMENDATIONS:   1. Advance Care Planning/Goals of Care: Goals include to maximize quality of life and symptom management.DNR on file. POA is DSS.  2. Symptom Management:   Patient has had several bouts of pneumonia in succession. Her speech eval in hospital did not state overt clinical issues with eating. She has dietary modifications currently. She is being Rx for pneumonia now with double therapy.  She has also fallen and cut her left forearm, and hit other body parts with bruising. She does exhibit continuing decline and loss of function from her baseline, prior to coming to [facility].  She states today she is fine, no pain. She does not like the food much. Dietary should discuss preferences with her and she should receive supplements. Her weight should be re checked but hospital reports it was 96 lbs in early Dec. This is 60 lb weight loss in < 1 year.  3. Follow up Palliative Care Visit: Palliative care will continue to  follow for goals of care clarification and symptom management. Return 4-6 weeks or prn.  4. Family /Caregiver/Community Supports: DSS is POA. Lives in [place].  5. Cognitive / Functional decline: A and O x 1-2. 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 Gestures and nods, reading lips. Physical decline evidenced by decreased mobility and balance.   I spent 35 minutes providing this consultation,  from 1030 to 1105. More than 50% of the time in this consultation was spent coordinating communication.   CODE STATUS: DNR  PPS: 40%  HOSPICE ELIGIBILITY/DIAGNOSIS: TBD  Subjective:  CHIEF COMPLAINT: increasing debility, falls  HISTORY OF PRESENT ILLNESS:  Alice Wallace is a 79 y.o. year old female  with recurring pneumonia, falls, debility.  She has had w/u by ST for possible aspiration. Repeatedly getting pnumonias. Now rx with double antibiotic therapy.  We are asked to consult around advance care planning and complex medical decision making.    Review and summarization of old 25 records shows or history from other than patient  shows history of recent pneumonias and sepsis. Review or lab tests, radiology,  or medicine 2.3 albumin, creatinine 0.36  History obtained from review of EMR, discussion with primary team, and  interview with family, caregiver  and/or [name]. Records reviewed and summarized above.   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   CAP (community acquired pneumonia) 03/21/2020   Sepsis ([place])  03/21/2020   Malnutrition of moderate degree 02/03/2020   Chronic diastolic CHF (congestive heart failure) ([place]) 02/03/2020   Unstageable pressure ulcer of heel ([place]) 02/01/2020   Nondisplaced fracture of base of neck of right femur, initial encounter for closed fracture ([place])    Goals of care, counseling/discussion    Palliative care by specialist    Hip fracture, unspecified laterality, closed, initial encounter ([place]) 01/26/2020   Chronic obstructive pulmonary disease with acute exacerbation ([place]) 01/21/2020   Non-STEMI (non-ST elevated myocardial infarction) ([place]) 01/21/2020   Acute respiratory failure with hypoxia and hypercapnia ([place])    Pneumonia due to COVID-19 virus 06/07/2019   Hypokalemia 06/07/2019   AKI (acute kidney injury) ([place]) 06/07/2019   Deafness 06/07/2019   COVID-19 virus infection 05/27/2019   Essential hypertension 11/29/2018   PAST MEDICAL HISTORY:  Active Ambulatory Problems    Diagnosis Date Noted   COVID-19 virus infection 05/27/2019   Pneumonia due to COVID-19 virus 06/07/2019   Hypokalemia 06/07/2019   AKI (acute kidney injury) ([place]) 06/07/2019   Deafness 06/07/2019   Acute respiratory failure with hypoxia and hypercapnia (HCC)    Chronic obstructive pulmonary disease with acute exacerbation ([place]) 01/21/2020   Non-STEMI (non-ST elevated myocardial infarction) ([place]) 01/21/2020   Hip fracture, unspecified laterality, closed, initial encounter ([place]) 01/26/2020   Goals of care, counseling/discussion    Palliative care by specialist    Nondisplaced fracture of base of neck of right femur, initial encounter for closed fracture ([place])    Unstageable pressure ulcer of heel ([place]) 02/01/2020   Malnutrition of moderate degree 02/03/2020   Chronic diastolic CHF (congestive heart failure) ([place]) 02/03/2020   Essential hypertension 11/29/2018   Gastroesophageal reflux disease with esophagitis 03/21/2020   Pressure injury of left  buttock, stage 2 ([place]) 03/21/2020   Chronic respiratory failure with hypoxia ([place]) 67/34/1937   Metabolic acidosis 90/24/0973   Hyperkalemia 03/21/2020   Chronic anemia 03/21/2020   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/21/2020   CAP (community acquired pneumonia) 03/21/2020   Sepsis ([place]) 03/21/2020   Acute maculopapular rash 03/22/2020   Protein-calorie malnutrition, severe 04/09/2020   Pressure injury of skin 04/09/2020   Resolved Ambulatory Problems    Diagnosis Date Noted   Acute hypoxemic respiratory failure due to COVID-19 ([place]) 05/27/2019   Acute on chronic respiratory failure with hypoxia and hypercapnia ([place]) 01/28/2020   Past Medical History:  Diagnosis Date   COPD (chronic obstructive pulmonary disease) ([place])    Deaf    Hypertension    Osteoporosis    SOCIAL HX:  Social History   Tobacco Use   Smoking status: Never Smoker   Smokeless tobacco: Never Used  Substance Use Topics   Alcohol use: Not Currently   FAMILY HX: No family history on file.     ALLERGIES:  Allergies  Allergen Reactions   Hydrocodone     Other reaction(s): Unknown   Naproxen     Other reaction(s): Unknown   Other Other (See Comments)    nuts   Sulfamethoxazole-Trimethoprim     Other reaction(s): Unknown   Tramadol     Other reaction(s): Unknown Pt has tolerated this med 1/5-1/7 with no issues     PERTINENT MEDICATIONS:  Outpatient Encounter Medications as of 05/05/2020  Medication Sig   amoxicillin-clavulanate (AUGMENTIN)  875-125 MG tablet Take 1 tablet by mouth 2 (two) times daily.   azithromycin (ZITHROMAX) 250 MG tablet Take 250 mg by mouth daily. Take 500 mg day 1 and 250 mg days 2-5.   OXYGEN Inhale 2 L/min into the lungs continuous.    acetaminophen (TYLENOL) 325 MG tablet Take 2 tablets (650 mg total) by mouth every 6 (six) hours. (Patient taking differently: Take 650 mg by mouth every 6 (six) hours. Not to exceed 4 grams / 24 hours)    acidophilus (RISAQUAD) CAPS capsule Take 1 capsule by mouth 3 (three) times daily.   albuterol (VENTOLIN HFA) 108 (90 Base) MCG/ACT inhaler Inhale 2 puffs into the lungs every 6 (six) hours as needed for wheezing or shortness of breath.    alendronate (FOSAMAX) 70 MG tablet Take 70 mg by mouth every Monday. Take with a full glass of water on an empty stomach.    aluminum-magnesium hydroxide-simethicone (MAALOX) 595-638-75 MG/5ML SUSP Take 30 mLs by mouth 4 (four) times daily -  before meals and at bedtime.   Amino Acids-Protein Hydrolys (FEEDING SUPPLEMENT, PRO-STAT SUGAR FREE 64,) LIQD Take 30 mLs by mouth 2 (two) times daily.   ascorbic acid (VITAMIN C) 500 MG tablet Take 500 mg by mouth daily.   aspirin EC 81 MG tablet Take 81 mg by mouth daily. Swallow whole.   atorvastatin (LIPITOR) 80 MG tablet Take 1 tablet (80 mg total) by mouth daily.   Cholecalciferol (VITAMIN D) 125 MCG (5000 UT) CAPS Take 5,000 Units by mouth daily.   clopidogrel (PLAVIX) 75 MG tablet Take 1 tablet (75 mg total) by mouth daily.   diltiazem (CARDIZEM CD) 120 MG 24 hr capsule Take 1 capsule (120 mg total) by mouth daily.   docusate sodium (COLACE) 100 MG capsule Take 1 capsule (100 mg total) by mouth 2 (two) times daily.   escitalopram (LEXAPRO) 10 MG tablet Take 10 mg by mouth at bedtime.   feeding supplement, ENSURE ENLIVE, (ENSURE ENLIVE) LIQD Take 237 mLs by mouth 3 (three) times daily between meals.   ferrous sulfate 300 (60 Fe) MG/5ML syrup Take 300 mg by mouth in the morning and at bedtime.    Fluticasone-Salmeterol (ADVAIR) 100-50 MCG/DOSE AEPB Inhale 1 puff into the lungs 2 (two) times daily.   Ipratropium-Albuterol (COMBIVENT RESPIMAT) 20-100 MCG/ACT AERS respimat Inhale 1 puff into the lungs every 6 (six) hours as needed for wheezing.   levofloxacin (LEVAQUIN) 750 MG tablet Take 1 tablet (750 mg total) by mouth daily. (Patient not taking: Reported on 05/05/2020)   loperamide (IMODIUM A-D) 2 MG  tablet Take 2 mg by mouth daily as needed for diarrhea or loose stools.   magnesium hydroxide (MILK OF MAGNESIA) 400 MG/5ML suspension Take 30 mLs by mouth daily as needed for mild constipation.   metoCLOPramide (REGLAN) 5 MG tablet Take 5 mg by mouth every 8 (eight) hours as needed for nausea. If ondansetron does not work, give reglan.    metoprolol tartrate (LOPRESSOR) 25 MG tablet Take 6.25 mg by mouth 2 (two) times daily.   nitroGLYCERIN (NITROSTAT) 0.4 MG SL tablet Place 1 tablet (0.4 mg total) under the tongue every 5 (five) minutes as needed for chest pain.   nystatin cream (MYCOSTATIN) Apply 1 application topically 2 (two) times daily.   omeprazole (PRILOSEC) 40 MG capsule Take 40 mg by mouth daily.   ondansetron (ZOFRAN) 4 MG tablet Take 1 tablet (4 mg total) by mouth every 6 (six) hours as needed for nausea.  potassium chloride SA (KLOR-CON) 20 MEQ tablet Take 40 mEq by mouth daily.   tiotropium (SPIRIVA) 18 MCG inhalation capsule Place 1 capsule (18 mcg total) into inhaler and inhale daily.   traMADol (ULTRAM) 50 MG tablet Take 1 tablet (50 mg total) by mouth every 8 (eight) hours.   No facility-administered encounter medications on file as of 05/05/2020.     Objective: ROS/staff report   General: NAD ENMT: denies dysphagia Pulmonary: denies  cough, denies increased SOB Abdomen: endorses fair appetite, denies constipation, endorses continence of bowel GU: denies dysuria, endorses continence of urine MSK:  endorses ROM limitations,+o falls reported Skin: skin tear left forearm Neurological: endorses weakness, denies pain, denies insomnia Psych: Endorses positive mood Heme/lymph/immuno: + post fall, on trunk  Physical Exam: Current and past weights: 96 lbs 04/08/20, baseline weight 160's, recorded 147   Lbs 02/20/20. Needs re check. Constitutional:  NAD General: frail appearing, thin EYES: anicteric sclera,lids intact, no discharge  ENMT: deaf / mute, oral  mucous membranes moist, dentition intact CV:no LE edema Pulmonary:  no increased work of breathing, no cough, no audible wheezes, oyxgen per [place]. Abdomen: intake 25-50%,no ascites GU: deferred MSK: severe sarcopenia, decreased ROM in all extremities, no contractures of LE, non ambulatory Skin: warm and dry, no rashes or wounds on visible skin, L FA with dressing in tact Neuro: Generalized weakness, + cognitive impairment, grossly non -focal Psych: non-anxious affect, A and O x 1 Hem/lymph/immuno: no widespread bruising   Thank you for the opportunity to participate in the care of Ms. Braunschweig.  The palliative care team will continue to follow. Please call our office at [phone] if we can be of additional assistance.  Jason Coop, NP , DNP, MPH, AGPCNP-BC, ACHPN  COVID-19 PATIENT SCREENING TOOL  Person answering questions: ____________staff______ _____   1.  Is the patient or any family member in the home showing any signs or symptoms regarding respiratory infection?               Person with Symptom- __________NA_________________  a. Fever                                                                          Yes___ No___          ___________________  b. Shortness of breath                                                    Yes___ No___          ___________________ c. Cough/congestion                                       Yes___  No___         ___________________ d. Body aches/pains  Yes___ No___        ____________________ e. Gastrointestinal symptoms (diarrhea, nausea)           Yes___ No___        ____________________  2. Within the past 14 days, has anyone living in the home had any contact with someone with or under investigation for COVID-19?    Yes___ No_X_   Person __________________

## 2020-06-09 ENCOUNTER — Non-Acute Institutional Stay: Payer: Medicare Other | Admitting: Adult Health Nurse Practitioner

## 2020-06-09 ENCOUNTER — Other Ambulatory Visit: Payer: Self-pay

## 2020-06-09 DIAGNOSIS — I5032 Chronic diastolic (congestive) heart failure: Secondary | ICD-10-CM

## 2020-06-09 DIAGNOSIS — J441 Chronic obstructive pulmonary disease with (acute) exacerbation: Secondary | ICD-10-CM

## 2020-06-09 DIAGNOSIS — Z515 Encounter for palliative care: Secondary | ICD-10-CM

## 2020-06-09 NOTE — Progress Notes (Signed)
Therapist, nutritional Palliative Care Consult Note Telephone: [phone]  Fax: [phone]  PATIENT NAME: Alice Wallace DOB: 04-26-1941 MRN: [id number]  PRIMARY CARE PROVIDER:   Smiley Houseman NP, [facility]  REFERRING PROVIDER:  Smiley Houseman NP  RESPONSIBLE PARTY:   Extended Emergency Contact Information Primary Emergency Contact: Annell Greening Work Phone: [phone] Mobile Phone: [phone] Relation: Legal Guardian Secondary Emergency Contact: Rodena Medin Mobile Phone: [phone] Relation: Other  Chief complaint:  Follow up palliative visit/debility    RECOMMENDATIONS and PLAN:  1.  Advanced care planning.  Patient has MOST uploaded to Epic indicating DNR and full hospital interventions.  Called guardian and spoke with on phone to update on visit  2.  COPD.  Patient stable at this time.  Continue current inhalers.  3.  CHF/CAD.  Patient having edema today.  Unsure if this is baseline.  She has no increased SOB or cough. She is currently on Aspirin and plavix.  Have reached out to PCP to see if this is new or baseline.    Palliative will continue to monitor for symptom management/decline and make recommendations as needed.  Will follow up in 6-8 weeks.  Have encouraged guardian to call with any questions or concerns.  HISTORY OF PRESENT ILLNESS:  Alice Wallace is an 80 y.o. year old female with multiple medical problems including COPD, recurrent pneumonia, diastolic CHF, HTN, CAD. Palliative Care was asked to help address goals of care. Reviewed chart and latest labs and imaging.  Spoke with guardian to obtain history. Patient was in ALF and had hospitalizations for pneumonia and was in SNF for short term rehab.  She is now back at ALF.  Patient is deaf but is able to read lips and make gestures.  She does not know ASL.  Was using white board to communicate some today but even this is limited and unsure if she is able to read what was written.  Guardian  states that she does not read or write but she was able to write some today on the white board.  She did not express any new concerns.  Guardian does state that she has lost weight but unsure as to how much.  Weights in medical chart seem inaccurate as she was 147 pounds in 02/2020 and then 96 pounds in 03/2020.  She looks as if she weighs more than 96 pounds.  Unable to obtain complete ROS/HPI due to communication barriers and staff did not have any new concerns.    CODE STATUS: DNR  PPS: 40% HOSPICE ELIGIBILITY/DIAGNOSIS: TBD  PHYSICAL EXAM:  HR 71 O2 96% on 4L General: NAD, frail appearing, thin Eyes: sclera anicteric and noninjected with no discharge noted ENMT: moist mucous membranes Cardiovascular: regular rate and rhythm Pulmonary: lung sounds diminished but no adventitious breath sounds heard; normal respiratory effort Abdomen: soft, nontender, + bowel sounds Extremities: 1-2+ edema on bilateral lower legs, no joint deformities Skin: no rashes on exposed skin Neurological: Weakness; patient deaf  PAST MEDICAL HISTORY:  Past Medical History:  Diagnosis Date  . COPD (chronic obstructive pulmonary disease) (HCC)   . Deaf   . Hypertension   . Osteoporosis     SOCIAL HX:  Social History   Tobacco Use  . Smoking status: Never Smoker  . Smokeless tobacco: Never Used  Substance Use Topics  . Alcohol use: Not Currently    ALLERGIES:  Allergies  Allergen Reactions  . Hydrocodone     Other reaction(s): Unknown  . Naproxen  Other reaction(s): Unknown  . Other Other (See Comments)    nuts  . Sulfamethoxazole-Trimethoprim     Other reaction(s): Unknown  . Tramadol     Other reaction(s): Unknown Pt has tolerated this med 1/5-1/7 with no issues     PERTINENT MEDICATIONS:  Outpatient Encounter Medications as of 06/09/2020  Medication Sig  . acetaminophen (TYLENOL) 325 MG tablet Take 2 tablets (650 mg total) by mouth every 6 (six) hours. (Patient taking differently:  Take 650 mg by mouth every 6 (six) hours. Not to exceed 4 grams / 24 hours)  . acidophilus (RISAQUAD) CAPS capsule Take 1 capsule by mouth 3 (three) times daily.  Marland Kitchen albuterol (VENTOLIN HFA) 108 (90 Base) MCG/ACT inhaler Inhale 2 puffs into the lungs every 6 (six) hours as needed for wheezing or shortness of breath.   Marland Kitchen alendronate (FOSAMAX) 70 MG tablet Take 70 mg by mouth every Monday. Take with a full glass of water on an empty stomach.   Marland Kitchen aluminum-magnesium hydroxide-simethicone (MAALOX) 200-200-20 MG/5ML SUSP Take 30 mLs by mouth 4 (four) times daily -  before meals and at bedtime.  . Amino Acids-Protein Hydrolys (FEEDING SUPPLEMENT, PRO-STAT SUGAR FREE 64,) LIQD Take 30 mLs by mouth 2 (two) times daily.  Marland Kitchen ascorbic acid (VITAMIN C) 500 MG tablet Take 500 mg by mouth daily.  Marland Kitchen aspirin EC 81 MG tablet Take 81 mg by mouth daily. Swallow whole.  Marland Kitchen atorvastatin (LIPITOR) 80 MG tablet Take 1 tablet (80 mg total) by mouth daily.  Marland Kitchen azithromycin (ZITHROMAX) 250 MG tablet Take 250 mg by mouth daily. Take 500 mg day 1 and 250 mg days 2-5.  Marland Kitchen Cholecalciferol (VITAMIN D) 125 MCG (5000 UT) CAPS Take 5,000 Units by mouth daily.  . clopidogrel (PLAVIX) 75 MG tablet Take 1 tablet (75 mg total) by mouth daily.  Marland Kitchen diltiazem (CARDIZEM CD) 120 MG 24 hr capsule Take 1 capsule (120 mg total) by mouth daily.  Marland Kitchen docusate sodium (COLACE) 100 MG capsule Take 1 capsule (100 mg total) by mouth 2 (two) times daily.  Marland Kitchen escitalopram (LEXAPRO) 10 MG tablet Take 10 mg by mouth at bedtime.  . feeding supplement, ENSURE ENLIVE, (ENSURE ENLIVE) LIQD Take 237 mLs by mouth 3 (three) times daily between meals.  . ferrous sulfate 300 (60 Fe) MG/5ML syrup Take 300 mg by mouth in the morning and at bedtime.   . Fluticasone-Salmeterol (ADVAIR) 100-50 MCG/DOSE AEPB Inhale 1 puff into the lungs 2 (two) times daily.  . Ipratropium-Albuterol (COMBIVENT RESPIMAT) 20-100 MCG/ACT AERS respimat Inhale 1 puff into the lungs every 6 (six) hours  as needed for wheezing.  Marland Kitchen levofloxacin (LEVAQUIN) 750 MG tablet Take 1 tablet (750 mg total) by mouth daily. (Patient not taking: Reported on 05/05/2020)  . loperamide (IMODIUM A-D) 2 MG tablet Take 2 mg by mouth daily as needed for diarrhea or loose stools.  . magnesium hydroxide (MILK OF MAGNESIA) 400 MG/5ML suspension Take 30 mLs by mouth daily as needed for mild constipation.  . metoCLOPramide (REGLAN) 5 MG tablet Take 5 mg by mouth every 8 (eight) hours as needed for nausea. If ondansetron does not work, give reglan.   . metoprolol tartrate (LOPRESSOR) 25 MG tablet Take 6.25 mg by mouth 2 (two) times daily.  . nitroGLYCERIN (NITROSTAT) 0.4 MG SL tablet Place 1 tablet (0.4 mg total) under the tongue every 5 (five) minutes as needed for chest pain.  Marland Kitchen nystatin cream (MYCOSTATIN) Apply 1 application topically 2 (two) times daily.  Marland Kitchen omeprazole (  PRILOSEC) 40 MG capsule Take 40 mg by mouth daily.  . ondansetron (ZOFRAN) 4 MG tablet Take 1 tablet (4 mg total) by mouth every 6 (six) hours as needed for nausea.  . OXYGEN Inhale 2 L/min into the lungs continuous.   . potassium chloride SA (KLOR-CON) 20 MEQ tablet Take 40 mEq by mouth daily.  Marland Kitchen tiotropium (SPIRIVA) 18 MCG inhalation capsule Place 1 capsule (18 mcg total) into inhaler and inhale daily.  . traMADol (ULTRAM) 50 MG tablet Take 1 tablet (50 mg total) by mouth every 8 (eight) hours.   No facility-administered encounter medications on file as of 06/09/2020.      Lexander Tremblay Marlena Clipper, NP

## 2020-07-11 ENCOUNTER — Emergency Department
Admission: EM | Admit: 2020-07-11 | Discharge: 2020-07-11 | Disposition: A | Discharge status: Other Acute Inpatient Hospital | Payer: Medicare Other | Attending: Emergency Medicine | Admitting: Emergency Medicine

## 2020-07-11 ENCOUNTER — Emergency Department: Payer: Medicare Other

## 2020-07-11 ENCOUNTER — Other Ambulatory Visit: Payer: Self-pay

## 2020-07-11 ENCOUNTER — Emergency Department (HOSPITAL_COMMUNITY): Payer: Medicare Other

## 2020-07-11 ENCOUNTER — Inpatient Hospital Stay (HOSPITAL_COMMUNITY)
Admission: EM | Admit: 2020-07-11 | Discharge: 2020-07-20 | DRG: 963 | Disposition: E | Payer: Medicare Other | Source: Other Acute Inpatient Hospital | Attending: General Surgery | Admitting: General Surgery

## 2020-07-11 ENCOUNTER — Encounter (HOSPITAL_COMMUNITY): Payer: Self-pay

## 2020-07-11 ENCOUNTER — Encounter: Payer: Self-pay | Admitting: Radiology

## 2020-07-11 ENCOUNTER — Other Ambulatory Visit (HOSPITAL_COMMUNITY): Payer: Self-pay

## 2020-07-11 DIAGNOSIS — R40242 Glasgow coma scale score 9-12, unspecified time: Secondary | ICD-10-CM | POA: Diagnosis not present

## 2020-07-11 DIAGNOSIS — M4317 Spondylolisthesis, lumbosacral region: Secondary | ICD-10-CM | POA: Diagnosis present

## 2020-07-11 DIAGNOSIS — R34 Anuria and oliguria: Secondary | ICD-10-CM | POA: Diagnosis not present

## 2020-07-11 DIAGNOSIS — Z7982 Long term (current) use of aspirin: Secondary | ICD-10-CM | POA: Insufficient documentation

## 2020-07-11 DIAGNOSIS — Z7902 Long term (current) use of antithrombotics/antiplatelets: Secondary | ICD-10-CM

## 2020-07-11 DIAGNOSIS — R4182 Altered mental status, unspecified: Secondary | ICD-10-CM | POA: Diagnosis not present

## 2020-07-11 DIAGNOSIS — I10 Essential (primary) hypertension: Secondary | ICD-10-CM | POA: Diagnosis not present

## 2020-07-11 DIAGNOSIS — W19XXXA Unspecified fall, initial encounter: Secondary | ICD-10-CM | POA: Insufficient documentation

## 2020-07-11 DIAGNOSIS — Z9981 Dependence on supplemental oxygen: Secondary | ICD-10-CM | POA: Diagnosis not present

## 2020-07-11 DIAGNOSIS — R109 Unspecified abdominal pain: Secondary | ICD-10-CM | POA: Insufficient documentation

## 2020-07-11 DIAGNOSIS — E43 Unspecified severe protein-calorie malnutrition: Secondary | ICD-10-CM | POA: Diagnosis not present

## 2020-07-11 DIAGNOSIS — R296 Repeated falls: Secondary | ICD-10-CM | POA: Diagnosis not present

## 2020-07-11 DIAGNOSIS — J449 Chronic obstructive pulmonary disease, unspecified: Secondary | ICD-10-CM | POA: Diagnosis present

## 2020-07-11 DIAGNOSIS — S0990XA Unspecified injury of head, initial encounter: Secondary | ICD-10-CM | POA: Diagnosis present

## 2020-07-11 DIAGNOSIS — R402432 Glasgow coma scale score 3-8, at arrival to emergency department: Secondary | ICD-10-CM | POA: Diagnosis not present

## 2020-07-11 DIAGNOSIS — Z7952 Long term (current) use of systemic steroids: Secondary | ICD-10-CM | POA: Insufficient documentation

## 2020-07-11 DIAGNOSIS — J9621 Acute and chronic respiratory failure with hypoxia: Secondary | ICD-10-CM | POA: Diagnosis not present

## 2020-07-11 DIAGNOSIS — I110 Hypertensive heart disease with heart failure: Secondary | ICD-10-CM | POA: Diagnosis not present

## 2020-07-11 DIAGNOSIS — S2241XA Multiple fractures of ribs, right side, initial encounter for closed fracture: Secondary | ICD-10-CM | POA: Insufficient documentation

## 2020-07-11 DIAGNOSIS — Z882 Allergy status to sulfonamides status: Secondary | ICD-10-CM

## 2020-07-11 DIAGNOSIS — Z681 Body mass index (BMI) 19 or less, adult: Secondary | ICD-10-CM

## 2020-07-11 DIAGNOSIS — I5032 Chronic diastolic (congestive) heart failure: Secondary | ICD-10-CM | POA: Insufficient documentation

## 2020-07-11 DIAGNOSIS — Z886 Allergy status to analgesic agent status: Secondary | ICD-10-CM

## 2020-07-11 DIAGNOSIS — S0081XA Abrasion of other part of head, initial encounter: Secondary | ICD-10-CM | POA: Diagnosis present

## 2020-07-11 DIAGNOSIS — Z23 Encounter for immunization: Secondary | ICD-10-CM | POA: Insufficient documentation

## 2020-07-11 DIAGNOSIS — H9193 Unspecified hearing loss, bilateral: Secondary | ICD-10-CM | POA: Diagnosis not present

## 2020-07-11 DIAGNOSIS — R54 Age-related physical debility: Secondary | ICD-10-CM | POA: Diagnosis not present

## 2020-07-11 DIAGNOSIS — S065X9A Traumatic subdural hemorrhage with loss of consciousness of unspecified duration, initial encounter: Secondary | ICD-10-CM

## 2020-07-11 DIAGNOSIS — S065XAA Traumatic subdural hemorrhage with loss of consciousness status unknown, initial encounter: Secondary | ICD-10-CM | POA: Diagnosis present

## 2020-07-11 DIAGNOSIS — I609 Nontraumatic subarachnoid hemorrhage, unspecified: Secondary | ICD-10-CM

## 2020-07-11 DIAGNOSIS — Z79899 Other long term (current) drug therapy: Secondary | ICD-10-CM | POA: Diagnosis not present

## 2020-07-11 DIAGNOSIS — L89152 Pressure ulcer of sacral region, stage 2: Secondary | ICD-10-CM | POA: Diagnosis present

## 2020-07-11 DIAGNOSIS — S2242XA Multiple fractures of ribs, left side, initial encounter for closed fracture: Secondary | ICD-10-CM | POA: Diagnosis present

## 2020-07-11 DIAGNOSIS — Z66 Do not resuscitate: Secondary | ICD-10-CM | POA: Diagnosis present

## 2020-07-11 DIAGNOSIS — S066X0A Traumatic subarachnoid hemorrhage without loss of consciousness, initial encounter: Secondary | ICD-10-CM | POA: Diagnosis not present

## 2020-07-11 DIAGNOSIS — S066X9A Traumatic subarachnoid hemorrhage with loss of consciousness of unspecified duration, initial encounter: Secondary | ICD-10-CM | POA: Diagnosis present

## 2020-07-11 DIAGNOSIS — Z8616 Personal history of COVID-19: Secondary | ICD-10-CM | POA: Insufficient documentation

## 2020-07-11 DIAGNOSIS — S065X0A Traumatic subdural hemorrhage without loss of consciousness, initial encounter: Secondary | ICD-10-CM | POA: Insufficient documentation

## 2020-07-11 DIAGNOSIS — Z885 Allergy status to narcotic agent status: Secondary | ICD-10-CM

## 2020-07-11 DIAGNOSIS — S270XXA Traumatic pneumothorax, initial encounter: Secondary | ICD-10-CM | POA: Insufficient documentation

## 2020-07-11 DIAGNOSIS — Y92129 Unspecified place in nursing home as the place of occurrence of the external cause: Secondary | ICD-10-CM

## 2020-07-11 DIAGNOSIS — J939 Pneumothorax, unspecified: Secondary | ICD-10-CM

## 2020-07-11 DIAGNOSIS — Z20822 Contact with and (suspected) exposure to covid-19: Secondary | ICD-10-CM | POA: Insufficient documentation

## 2020-07-11 DIAGNOSIS — H913 Deaf nonspeaking, not elsewhere classified: Secondary | ICD-10-CM | POA: Diagnosis not present

## 2020-07-11 DIAGNOSIS — Z7189 Other specified counseling: Secondary | ICD-10-CM | POA: Diagnosis not present

## 2020-07-11 DIAGNOSIS — G8191 Hemiplegia, unspecified affecting right dominant side: Secondary | ICD-10-CM | POA: Diagnosis not present

## 2020-07-11 DIAGNOSIS — W1830XA Fall on same level, unspecified, initial encounter: Secondary | ICD-10-CM | POA: Diagnosis not present

## 2020-07-11 DIAGNOSIS — Z515 Encounter for palliative care: Secondary | ICD-10-CM

## 2020-07-11 DIAGNOSIS — M810 Age-related osteoporosis without current pathological fracture: Secondary | ICD-10-CM | POA: Diagnosis present

## 2020-07-11 DIAGNOSIS — Z0189 Encounter for other specified special examinations: Secondary | ICD-10-CM

## 2020-07-11 LAB — COMPREHENSIVE METABOLIC PANEL
ALT: 17 U/L (ref 0–44)
AST: 30 U/L (ref 15–41)
Albumin: 2.8 g/dL — ABNORMAL LOW (ref 3.5–5.0)
Alkaline Phosphatase: 86 U/L (ref 38–126)
Anion gap: 10 (ref 5–15)
BUN: 10 mg/dL (ref 8–23)
CO2: 23 mmol/L (ref 22–32)
Calcium: 8.2 mg/dL — ABNORMAL LOW (ref 8.9–10.3)
Chloride: 101 mmol/L (ref 98–111)
Creatinine, Ser: 0.6 mg/dL (ref 0.44–1.00)
GFR, Estimated: >60 mL/min (ref >60)
Glucose, Bld: 142 mg/dL — ABNORMAL HIGH (ref 70–99)
Potassium: 4 mmol/L (ref 3.5–5.1)
Sodium: 134 mmol/L — ABNORMAL LOW (ref 135–145)
Total Bilirubin: 0.7 mg/dL (ref 0.3–1.2)
Total Protein: 6.2 g/dL — ABNORMAL LOW (ref 6.5–8.1)

## 2020-07-11 LAB — CBC WITH DIFFERENTIAL/PLATELET
Abs Immature Granulocytes: 0.29 10*3/uL — ABNORMAL HIGH (ref 0.00–0.07)
Basophils Absolute: 0.1 10*3/uL (ref 0.0–0.1)
Basophils Relative: 0 %
Eosinophils Absolute: 0.3 10*3/uL (ref 0.0–0.5)
Eosinophils Relative: 2 %
HCT: 27.5 % — ABNORMAL LOW (ref 36.0–46.0)
Hemoglobin: 8.7 g/dL — ABNORMAL LOW (ref 12.0–15.0)
Immature Granulocytes: 2 %
Lymphocytes Relative: 8 %
Lymphs Abs: 1.3 10*3/uL (ref 0.7–4.0)
MCH: 28.6 pg (ref 26.0–34.0)
MCHC: 31.6 g/dL (ref 30.0–36.0)
MCV: 90.5 fL (ref 80.0–100.0)
Monocytes Absolute: 0.9 10*3/uL (ref 0.1–1.0)
Monocytes Relative: 5 %
Neutro Abs: 13.4 10*3/uL — ABNORMAL HIGH (ref 1.7–7.7)
Neutrophils Relative %: 83 %
Platelets: 386 10*3/uL (ref 150–400)
RBC: 3.04 MIL/uL — ABNORMAL LOW (ref 3.87–5.11)
RDW: 16.6 % — ABNORMAL HIGH (ref 11.5–15.5)
WBC: 16.2 10*3/uL — ABNORMAL HIGH (ref 4.0–10.5)
nRBC: 0 % (ref 0.0–0.2)

## 2020-07-11 LAB — I-STAT VENOUS BLOOD GAS, ED
Acid-Base Excess: 1 mmol/L (ref 0.0–2.0)
Bicarbonate: 22.7 mmol/L (ref 20.0–28.0)
Calcium, Ion: 0.85 mmol/L — CL (ref 1.15–1.40)
HCT: 22 % — ABNORMAL LOW (ref 36.0–46.0)
Hemoglobin: 7.5 g/dL — ABNORMAL LOW (ref 12.0–15.0)
O2 Saturation: 100 %
Patient temperature: 22
Potassium: 3.8 mmol/L (ref 3.5–5.1)
Sodium: 134 mmol/L — ABNORMAL LOW (ref 135–145)
TCO2: 23 mmol/L (ref 22–32)
pCO2, Ven: <15 mmHg — CL (ref 44.0–60.0)
pH, Ven: 7.823 (ref 7.250–7.430)
pO2, Ven: 126 mmHg — ABNORMAL HIGH (ref 32.0–45.0)

## 2020-07-11 LAB — URINALYSIS, COMPLETE (UACMP) WITH MICROSCOPIC
Bacteria, UA: NONE SEEN
Bilirubin Urine: NEGATIVE
Glucose, UA: NEGATIVE mg/dL
Hgb urine dipstick: NEGATIVE
Ketones, ur: NEGATIVE mg/dL
Leukocytes,Ua: NEGATIVE
Nitrite: NEGATIVE
Protein, ur: NEGATIVE mg/dL
Specific Gravity, Urine: 1.011 (ref 1.005–1.030)
Squamous Epithelial / HPF: NONE SEEN (ref 0–5)
WBC, UA: NONE SEEN WBC/hpf (ref 0–5)
pH: 5 (ref 5.0–8.0)

## 2020-07-11 LAB — TYPE AND SCREEN
ABO/RH(D): A POS
Antibody Screen: NEGATIVE

## 2020-07-11 LAB — PROTIME-INR
INR: 1.1 (ref 0.8–1.2)
Prothrombin Time: 14 seconds (ref 11.4–15.2)

## 2020-07-11 LAB — RESP PANEL BY RT-PCR (FLU A&B, COVID) ARPGX2
Influenza A by PCR: NEGATIVE
Influenza B by PCR: NEGATIVE
SARS Coronavirus 2 by RT PCR: NEGATIVE

## 2020-07-11 LAB — GLUCOSE, CAPILLARY
Glucose-Capillary: 117 mg/dL — ABNORMAL HIGH (ref 70–99)
Glucose-Capillary: 99 mg/dL (ref 70–99)

## 2020-07-11 LAB — LACTIC ACID, PLASMA: Lactic Acid, Venous: 1.9 mmol/L (ref 0.5–1.9)

## 2020-07-11 LAB — TROPONIN I (HIGH SENSITIVITY): Troponin I (High Sensitivity): 11 ng/L (ref <18)

## 2020-07-11 MED ORDER — PROPOFOL 1000 MG/100ML IV EMUL
INTRAVENOUS | Status: AC
  Administered 2020-07-11: 5 ug/kg/min via INTRAVENOUS
  Filled 2020-07-11: qty 100

## 2020-07-11 MED ORDER — FENTANYL CITRATE (PF) 100 MCG/2ML IJ SOLN
25.0000 ug | Freq: Once | INTRAMUSCULAR | Status: AC
  Administered 2020-07-11: 25 ug via INTRAVENOUS

## 2020-07-11 MED ORDER — DOCUSATE SODIUM 100 MG PO CAPS: 100.0000 mg | ORAL_CAPSULE | Freq: Two times a day (BID) | ORAL | Status: DC

## 2020-07-11 MED ORDER — IOHEXOL 300 MG/ML  SOLN: 100.0000 mL | Freq: Once | INTRAMUSCULAR | Status: DC | PRN

## 2020-07-11 MED ORDER — INSULIN ASPART 100 UNIT/ML ~~LOC~~ SOLN: 0.0000 [IU] | SUBCUTANEOUS | Status: DC

## 2020-07-11 MED ORDER — DOCUSATE SODIUM 50 MG/5ML PO LIQD
100.0000 mg | Freq: Two times a day (BID) | ORAL | Status: DC
  Administered 2020-07-12: 100 mg
  Filled 2020-07-11 (×2): qty 10

## 2020-07-11 MED ORDER — FENTANYL CITRATE (PF) 100 MCG/2ML IJ SOLN
25.0000 ug | INTRAMUSCULAR | Status: DC | PRN
  Administered 2020-07-11 (×2): 100 ug via INTRAVENOUS
  Administered 2020-07-11: 50 ug via INTRAVENOUS
  Administered 2020-07-12: 100 ug via INTRAVENOUS
  Filled 2020-07-11 (×4): qty 2

## 2020-07-11 MED ORDER — FENTANYL CITRATE (PF) 100 MCG/2ML IJ SOLN: 25.0000 ug | INTRAMUSCULAR | Status: DC | PRN

## 2020-07-11 MED ORDER — ROCURONIUM BROMIDE 50 MG/5ML IV SOLN
100.0000 mg | Freq: Once | INTRAVENOUS | Status: AC
  Administered 2020-07-11: 100 mg via INTRAVENOUS
  Filled 2020-07-11: qty 10

## 2020-07-11 MED ORDER — TETANUS-DIPHTH-ACELL PERTUSSIS 5-2.5-18.5 LF-MCG/0.5 IM SUSY
0.5000 mL | PREFILLED_SYRINGE | Freq: Once | INTRAMUSCULAR | Status: AC
  Administered 2020-07-11: 0.5 mL via INTRAMUSCULAR
  Filled 2020-07-11: qty 0.5

## 2020-07-11 MED ORDER — ORAL CARE MOUTH RINSE
15.0000 mL | OROMUCOSAL | Status: DC
  Administered 2020-07-12 (×8): 15 mL via OROMUCOSAL

## 2020-07-11 MED ORDER — NOREPINEPHRINE 4 MG/250ML-% IV SOLN
INTRAVENOUS | Status: AC
  Filled 2020-07-11: qty 250

## 2020-07-11 MED ORDER — PROPOFOL 1000 MG/100ML IV EMUL: 5.0000 ug/kg/min | INTRAVENOUS | Status: DC

## 2020-07-11 MED ORDER — CHLORHEXIDINE GLUCONATE 0.12% ORAL RINSE (MEDLINE KIT)
15.0000 mL | Freq: Two times a day (BID) | OROMUCOSAL | Status: DC
  Administered 2020-07-11 – 2020-07-12 (×2): 15 mL via OROMUCOSAL

## 2020-07-11 MED ORDER — SODIUM CHLORIDE 0.9 % IV SOLN: INTRAVENOUS | Status: DC

## 2020-07-11 MED ORDER — ETOMIDATE 2 MG/ML IV SOLN
30.0000 mg | Freq: Once | INTRAVENOUS | Status: AC
  Administered 2020-07-11: 30 mg via INTRAVENOUS

## 2020-07-11 MED ORDER — PROPOFOL 1000 MG/100ML IV EMUL
0.0000 ug/kg/min | INTRAVENOUS | Status: DC
  Administered 2020-07-11: 20 ug/kg/min via INTRAVENOUS
  Filled 2020-07-11 (×3): qty 100

## 2020-07-11 MED ORDER — FENTANYL CITRATE (PF) 100 MCG/2ML IJ SOLN
50.0000 ug | Freq: Once | INTRAMUSCULAR | Status: AC
  Administered 2020-07-11: 50 ug via INTRAVENOUS
  Filled 2020-07-11: qty 2

## 2020-07-11 MED ORDER — POLYETHYLENE GLYCOL 3350 17 G PO PACK
17.0000 g | PACK | Freq: Every day | ORAL | Status: DC
  Administered 2020-07-12: 17 g
  Filled 2020-07-11 (×2): qty 1

## 2020-07-11 MED ORDER — ACETAMINOPHEN 325 MG PO TABS: 650.0000 mg | ORAL_TABLET | ORAL | Status: DC | PRN

## 2020-07-11 MED ORDER — MORPHINE SULFATE (PF) 2 MG/ML IV SOLN
2.0000 mg | INTRAVENOUS | Status: DC | PRN
  Administered 2020-07-11: 4 mg via INTRAVENOUS
  Filled 2020-07-11: qty 2

## 2020-07-11 MED ORDER — SODIUM CHLORIDE 0.9 % IV SOLN
3.0000 g | Freq: Once | INTRAVENOUS | Status: AC
  Administered 2020-07-11: 3 g via INTRAVENOUS
  Filled 2020-07-11: qty 8

## 2020-07-11 MED ORDER — IOHEXOL 300 MG/ML  SOLN
75.0000 mL | Freq: Once | INTRAMUSCULAR | Status: AC | PRN
  Administered 2020-07-11: 75 mL via INTRAVENOUS

## 2020-07-11 NOTE — ED Notes (Signed)
Pt not able to sign for transfer due to mental status

## 2020-07-11 NOTE — ED Notes (Signed)
Dr. Kinsinger at the bedside. 

## 2020-07-11 NOTE — Progress Notes (Signed)
Chest tube continuous bubbling, checked all connection and no air leak found. MD trauma [place] notified

## 2020-07-11 NOTE — ED Notes (Signed)
Social work consulted to initiate report with DSS

## 2020-07-11 NOTE — Progress Notes (Addendum)
EDCSW contacted by charge RN and RNCM regarding severity of injuries for fall. EDCSW reached out to guardian to no avail. EDCSW contacted DSS on-call and is awaiting to hear back from APS. EDCSW notes as the event occurred at a facility will most likely require DHHS follow-up on Monday. EDCSW discussed with colleague and notes order placed to support follow-up.   4:35p: EDCSW updating note with additional information. EDCSW contacted DSS guardian [facility] via personal cell and noted guardian reported she was updated by facility around 4pm (patient arrived at [facility] ed at [phone]). Guardian reports she last saw the patient Friday who was sitting in her chair and at relative baseline. Guardian reported this was her second fall at the facility and expressed concerns regarding the first fall as information did not line up. EDCSW discussed report of fall and being admitted to trauma service and concern from medical staff injuries were quite severe in concerns to the injuries sustained. Guardian asked DHHS report to be filed. Guardian additionally reported patient has lived at this facility for several years at this point. Guardian confirmed she will keep her cellphone at the ready as medical staff is trying to reach her.  EDCSW sent page to trauma attending regarding guardian having cellphone and able to be reached as noted in his conversation he could not reach her.   EDCSW spoke with medical tech at [facility] and notes this event occurred on first shirt and first shift staff have left for second shift to take over. Per facility patient was seen in the AM on morning rounds (0700) and appeared at baseline. Per facility they later found patient after a fall in the afternoon and do not know when the fall occurred. Per facility patient had no visitors today.   EDCSW left HIPPA-compliant voicemail with on-call TOC supervisor to provide update and need for follow-up with DHHS. EDCSW discussed with peers who will  assist in follow up as able to do so.   Rosette Reveal, LCSW, LCAS Clinical Social Worker II Emergency Department, [facility] Transitions of Care Department, [facility] [phone]

## 2020-07-11 NOTE — ED Notes (Signed)
Pt unable to sign for transfer d/t being intubated. EDP called legal guardian with no answer.

## 2020-07-11 NOTE — ED Provider Notes (Signed)
[facility] Emergency Department Provider Note  ____________________________________________   Event Date/Time   First MD Initiated Contact with Patient 08-05-20 [phone]     (approximate)  I have reviewed the triage vital signs and the nursing notes.   HISTORY  Chief Complaint Fall and Shortness of Breath   HPI Alice Wallace is a 80 y.o. female with a past medical history of COPD and chronic hypoxic respiratory failure on 4 L at baseline, HTN, osteoporosis, deaf and mute, CHF, chronic sacral decubitus ulcer and paroxysmal A. fib rate controlled on diltiazem anticoagulated who presents via EMS from [facility] after she was reportedly found on the floor.  Patient is unable provide any history to EMS or on arrival secondary to altered mental status.  Patient was noted to be hypoxic with EMS on 4 L with SPO2 in the 80s and placed on nonrebreather with improvement to the mid 90s.  I attempted to reach legal guardian listed in the chart but went to voicemail x2.  No additional history is available on patient arrival.          Past Medical History:  Diagnosis Date  . COPD (chronic obstructive pulmonary disease) (HCC)   . Deaf   . Hypertension   . Osteoporosis     Patient Active Problem List   Diagnosis Date Noted  . Protein-calorie malnutrition, severe 04/09/2020  . Pressure injury of skin 04/09/2020  . Acute maculopapular rash 03/22/2020  . Gastroesophageal reflux disease with esophagitis 03/21/2020  . Pressure injury of left buttock, stage 2 (HCC) 03/21/2020  . Chronic respiratory failure with hypoxia (HCC) 03/21/2020  . Metabolic acidosis 03/21/2020  . Hyperkalemia 03/21/2020  . Chronic anemia 03/21/2020  . 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  . CAP (community acquired pneumonia) 03/21/2020  . Sepsis (HCC) 03/21/2020  . Malnutrition of moderate degree 02/03/2020  . Chronic diastolic CHF (congestive heart failure) (HCC) 02/03/2020  .  Unstageable pressure ulcer of heel (HCC) 02/01/2020  . Nondisplaced fracture of base of neck of right femur, initial encounter for closed fracture (HCC)   . Goals of care, counseling/discussion   . Palliative care by specialist   . Hip fracture, unspecified laterality, closed, initial encounter (HCC) 01/26/2020  . Chronic obstructive pulmonary disease with acute exacerbation (HCC) 01/21/2020  . Non-STEMI (non-ST elevated myocardial infarction) (HCC) 01/21/2020  . Acute respiratory failure with hypoxia and hypercapnia (HCC)   . Pneumonia due to COVID-19 virus 06/07/2019  . Hypokalemia 06/07/2019  . AKI (acute kidney injury) (HCC) 06/07/2019  . Deafness 06/07/2019  . COVID-19 virus infection 05/27/2019  . Essential hypertension 11/29/2018    Past Surgical History:  Procedure Laterality Date  . INTRAMEDULLARY (IM) NAIL INTERTROCHANTERIC Right 01/26/2020   Procedure: Right Hip IM nail with TFNA;  Surgeon: Lyndle Herrlich, MD;  Location: [facility] ORS;  Service: Orthopedics;  Laterality: Right;    Prior to Admission medications   Medication Sig Start Date End Date Taking? Authorizing Provider  acetaminophen (TYLENOL) 325 MG tablet Take 2 tablets (650 mg total) by mouth every 6 (six) hours. Patient taking differently: Take 650 mg by mouth every 6 (six) hours. Not to exceed 4 grams / 24 hours 02/03/20   Esaw Grandchild A, DO  acidophilus (RISAQUAD) CAPS capsule Take 1 capsule by mouth 3 (three) times daily. 02/03/20   Pennie Banter, DO  albuterol (VENTOLIN HFA) 108 (90 Base) MCG/ACT inhaler Inhale 2 puffs into the lungs every 6 (six) hours as needed for wheezing or  shortness of breath.     [provider]  alendronate (FOSAMAX) 70 MG tablet Take 70 mg by mouth every Monday. Take with a full glass of water on an empty stomach.     [provider]  aluminum-magnesium hydroxide-simethicone (MAALOX) 200-200-20 MG/5ML SUSP Take 30 mLs by mouth 4 (four) times daily -  before meals and at  bedtime.    [provider]  Amino Acids-Protein Hydrolys (FEEDING SUPPLEMENT, PRO-STAT SUGAR FREE 64,) LIQD Take 30 mLs by mouth 2 (two) times daily.    [provider]  ascorbic acid (VITAMIN C) 500 MG tablet Take 500 mg by mouth daily.    [provider]  aspirin EC 81 MG tablet Take 81 mg by mouth daily. Swallow whole.    [provider]  atorvastatin (LIPITOR) 80 MG tablet Take 1 tablet (80 mg total) by mouth daily. 02/04/20   Pennie Banter, DO  azithromycin (ZITHROMAX) 250 MG tablet Take 250 mg by mouth daily. Take 500 mg day 1 and 250 mg days 2-5.    [provider]  Cholecalciferol (VITAMIN D) 125 MCG (5000 UT) CAPS Take 5,000 Units by mouth daily.    [provider]  clopidogrel (PLAVIX) 75 MG tablet Take 1 tablet (75 mg total) by mouth daily. 02/04/20   Pennie Banter, DO  diltiazem (CARDIZEM CD) 120 MG 24 hr capsule Take 1 capsule (120 mg total) by mouth daily. 02/04/20   Pennie Banter, DO  docusate sodium (COLACE) 100 MG capsule Take 1 capsule (100 mg total) by mouth 2 (two) times daily. 02/03/20   Esaw Grandchild A, DO  escitalopram (LEXAPRO) 10 MG tablet Take 10 mg by mouth at bedtime. 06/08/16   [provider]  feeding supplement, ENSURE ENLIVE, (ENSURE ENLIVE) LIQD Take 237 mLs by mouth 3 (three) times daily between meals. 06/10/19   Enedina Finner, MD  ferrous sulfate 300 (60 Fe) MG/5ML syrup Take 300 mg by mouth in the morning and at bedtime.     [provider]  Fluticasone-Salmeterol (ADVAIR) 100-50 MCG/DOSE AEPB Inhale 1 puff into the lungs 2 (two) times daily.    [provider]  Ipratropium-Albuterol (COMBIVENT RESPIMAT) 20-100 MCG/ACT AERS respimat Inhale 1 puff into the lungs every 6 (six) hours as needed for wheezing.    [provider]  levofloxacin (LEVAQUIN) 750 MG tablet Take 1 tablet (750 mg total) by mouth daily. Patient not taking: Reported on 05/05/2020 04/11/20   Rhetta Mura, MD  loperamide (IMODIUM A-D) 2 MG tablet Take 2 mg by mouth daily as needed for diarrhea or loose stools.    [provider]  magnesium hydroxide (MILK OF MAGNESIA) 400 MG/5ML suspension Take 30 mLs by mouth daily as needed for mild constipation.    [provider]  metoCLOPramide (REGLAN) 5 MG tablet Take 5 mg by mouth every 8 (eight) hours as needed for nausea. If ondansetron does not work, give reglan.     [provider]  metoprolol tartrate (LOPRESSOR) 25 MG tablet Take 6.25 mg by mouth 2 (two) times daily.    [provider]  nitroGLYCERIN (NITROSTAT) 0.4 MG SL tablet Place 1 tablet (0.4 mg total) under the tongue every 5 (five) minutes as needed for chest pain. 02/03/20   Pennie Banter, DO  nystatin cream (MYCOSTATIN) Apply 1 application topically 2 (two) times daily. 03/16/20   [provider]  omeprazole (PRILOSEC) 40 MG capsule Take 40 mg by mouth daily. 03/04/20  [provider]  ondansetron (ZOFRAN) 4 MG tablet Take 1 tablet (4 mg total) by mouth every 6 (six) hours as needed for nausea. 02/03/20   Esaw Grandchild A, DO  OXYGEN Inhale 2 L/min into the lungs continuous.     [provider]  potassium chloride SA (KLOR-CON) 20 MEQ tablet Take 40 mEq by mouth daily.    [provider]  tiotropium (SPIRIVA) 18 MCG inhalation capsule Place 1 capsule (18 mcg total) into inhaler and inhale daily. 04/11/20   Rhetta Mura, MD  traMADol (ULTRAM) 50 MG tablet Take 1 tablet (50 mg total) by mouth every 8 (eight) hours. 04/10/20   Rhetta Mura, MD    Allergies Hydrocodone, Naproxen, Other, Sulfamethoxazole-trimethoprim, and Tramadol  No family history on file.  Social History Social History   Tobacco Use  . Smoking status: Never Smoker  . Smokeless tobacco: Never Used  Substance Use Topics  . Alcohol use: Not Currently  . Drug use: Not Currently    Review of Systems  Review of  Systems  Unable to perform ROS: Mental acuity      ____________________________________________   PHYSICAL EXAM:  VITAL SIGNS: ED Triage Vitals  Enc Vitals Group     BP      Pulse      Resp      Temp      Temp src      SpO2      Weight      Height      Head Circumference      Peak Flow      Pain Score      Pain Loc      Pain Edu?      Excl. in GC?    Vitals:   06/24/2020 1130 07/06/2020 1134  BP: 132/70 (!) 117/58  Pulse: 96 98  Resp: (!) 21 (!) 24  Temp:  (!) 96.7 F (35.9 C)  SpO2: 100% 100%   Physical Exam Vitals and nursing note reviewed.  Constitutional:      General: She is in acute distress.     Appearance: She is ill-appearing.  HENT:     Head: Normocephalic.     Right Ear: External ear normal.     Left Ear: External ear normal.     Nose: Nose normal.  Eyes:     Extraocular Movements: Extraocular movements intact.     Conjunctiva/sclera: Conjunctivae normal.     Pupils: Pupils are equal, round, and reactive to light.  Cardiovascular:     Rate and Rhythm: Tachycardia present.     Pulses: Normal pulses.  Pulmonary:     Effort: Tachypnea and respiratory distress present.     Breath sounds: Examination of the right-middle field reveals rhonchi. Examination of the left-middle field reveals rhonchi. Examination of the right-lower field reveals rhonchi. Examination of the left-lower field reveals rhonchi. Rhonchi present.  Chest:     Chest wall: Tenderness present.  Abdominal:     General: There is no distension.     Tenderness: There is no abdominal tenderness. There is no guarding.  Musculoskeletal:     Cervical back: No rigidity.     Right lower leg: Edema present.     Left lower leg: Edema present.  Skin:    General: Skin is warm and dry.  Neurological:     General: No focal deficit present.     Mental Status: She is alert.     Approximately 1 cm linear lack over the  right anterior temporal scalp at the edge of the eye socket.  PERRLA.  EOMI.   Cranial nerves II through XII otherwise grossly intact.  Oropharynx unremarkable.  No apparent tenderness palpation over the C/T/L-spine no palpable deformities.  2+ bilateral radial and DP pulses.  Significant chronic appearing lower extremity edema with wraps in place that were removed.  No clear deformity or large effusion at the bilateral shoulders, elbows, wrists, knees or ankles.  Patient is able to move her hands and feet with significant prompting from this examiner.  Patient is able to give this examiner thumbs up with both hands and move her toes on command. ____________________________________________   LABS (all labs ordered are listed, but only abnormal results are displayed)  Labs Reviewed  COMPREHENSIVE METABOLIC PANEL - Abnormal; Notable for the following components:      Result Value   Sodium 134 (*)    Glucose, Bld 142 (*)    Calcium 8.2 (*)    Total Protein 6.2 (*)    Albumin 2.8 (*)    All other components within normal limits  CBC WITH DIFFERENTIAL/PLATELET - Abnormal; Notable for the following components:   WBC 16.2 (*)    RBC 3.04 (*)    Hemoglobin 8.7 (*)    HCT 27.5 (*)    RDW 16.6 (*)    Neutro Abs 13.4 (*)    Abs Immature Granulocytes 0.29 (*)    All other components within normal limits  URINALYSIS, COMPLETE (UACMP) WITH MICROSCOPIC - Abnormal; Notable for the following components:   Color, Urine YELLOW (*)    APPearance CLEAR (*)    All other components within normal limits  BLOOD GAS, VENOUS - Abnormal; Notable for the following components:   Acid-base deficit 2.3 (*)    All other components within normal limits  RESP PANEL BY RT-PCR (FLU A&B, COVID) ARPGX2  CULTURE, BLOOD (ROUTINE X 2)  CULTURE, BLOOD (ROUTINE X 2)  CULTURE, BLOOD (SINGLE)  URINE CULTURE  LACTIC ACID, PLASMA  PROTIME-INR  LACTIC ACID, PLASMA  BRAIN NATRIURETIC PEPTIDE  APTT  TYPE AND SCREEN  TROPONIN I (HIGH SENSITIVITY)  TROPONIN I (HIGH SENSITIVITY)    ____________________________________________  EKG  Sinus rhythm with a ventricular rate of 100, left anterior fascicle block, nonspecific ST changes in V2 and V3 without other clear evidence of acute ischemia.  Unremarkable intervals. ____________________________________________  RADIOLOGY  ED MD interpretation: Initial chest x-ray remarkable for right-sided pneumothorax with some tethering of the lung.  There is also fracture of the right fourth rib.  Post intubation and chest tube x-ray shows ET tube in appropriate position and improvement in pneumothorax with chest tube in place.  Official radiology report(s): DG Chest 1 View  Result Date: 07/02/2020 CLINICAL DATA:  80 year old female with shortness of breath and fall. EXAM: CHEST  1 VIEW COMPARISON:  04/08/2020 CT and prior studies FINDINGS: A RIGHT pneumothorax is noted with moderate lateral component and large basilar component. There is tethering of the mid-lower LEFT lung to the pleura. An acute fracture of the posterior RIGHT 4th rib is noted. Emphysema/COPD changes are noted. Multiple remote bilateral rib fractures are present. Mild LEFT basilar atelectasis/opacities noted. An endotracheal tube is present with tip 5.5 cm above the carina. IMPRESSION: 1. RIGHT pneumothorax with moderate lateral and large basilar component. 2. Acute fracture of the posterior RIGHT 4th rib. 3. LEFT basilar atelectasis/opacities. 4.  Emphysema (ICD10-J43.9). Critical Value/emergent results were called by telephone at the time of interpretation on 06/30/2020 at 9:55  am to provider [facility] , who verbally acknowledged these results. Electronically Signed   By: Harmon Pier M.D.   On: 06/30/2020 09:58   CT Head Wo Contrast  Result Date: 06/30/2020 CLINICAL DATA:  Unwitnessed fall. EXAM: CT HEAD WITHOUT CONTRAST CT CERVICAL SPINE WITHOUT CONTRAST TECHNIQUE: Multidetector CT imaging of the head and cervical spine was performed following the standard  protocol without intravenous contrast. Multiplanar CT image reconstructions of the cervical spine were also generated. COMPARISON:  None. FINDINGS: CT HEAD FINDINGS Brain: There is a large left subdural hematoma overlying the left cerebral hemisphere. This measures 1.2 cm in maximum thickness and has mass effect upon the underlying brain parenchyma. Anterior parafalcine component is noted which measures 3.4 mm in thickness. There is also extension along the left tentorium. Left to right midline shift measures 5 mm. Subarachnoid hemorrhage is identified with hyperdense blood filling the basilar cisterns as well as along the surface of the right frontal lobe and left posterior temporal lobe. There is mild diffuse low-attenuation within the subcortical and periventricular white matter compatible with chronic microvascular disease. Prominence of the sulci and ventricles compatible with brain atrophy. Vascular: No hyperdense vessel or unexpected calcification. Skull: Normal. Negative for fracture or focal lesion. Sinuses/Orbits: Mucoperiosteal thickening involving the right maxillary sinus. Mastoid air cells are clear. Other: None. CT CERVICAL SPINE FINDINGS Alignment: Normal. Skull base and vertebrae: No acute fracture. No primary bone lesion or focal pathologic process. Soft tissues and spinal canal: No prevertebral fluid or swelling. No visible canal hematoma. Disc levels: Multilevel disc space narrowing and endplate spurring noted. Most advanced C5-6 and C6-7. Upper chest: Advanced changes of bullous emphysema. ET tube and NG tube are identified Other: None IMPRESSION: 1. Large left sub dural hematoma with mass effect upon the underlying brain parenchyma. Left to right midline shift measures 5 mm. 2. Subarachnoid hemorrhage within the basilar cisterns and along the surface of the right frontal lobe and left posterior temporal lobe. 3. No evidence for cervical spine fracture. 4. Cervical spondylosis Critical  Value/emergent results were called by telephone at the time of interpretation on 06/25/2020 at 12:08 pm to provider Dr. Roxan Hockey, who verbally acknowledged these results. Electronically Signed   By: Signa Kell M.D.   On: 06/27/2020 12:07   CT Cervical Spine Wo Contrast  Result Date: 06/24/2020 CLINICAL DATA:  Unwitnessed fall. EXAM: CT HEAD WITHOUT CONTRAST CT CERVICAL SPINE WITHOUT CONTRAST TECHNIQUE: Multidetector CT imaging of the head and cervical spine was performed following the standard protocol without intravenous contrast. Multiplanar CT image reconstructions of the cervical spine were also generated. COMPARISON:  None. FINDINGS: CT HEAD FINDINGS Brain: There is a large left subdural hematoma overlying the left cerebral hemisphere. This measures 1.2 cm in maximum thickness and has mass effect upon the underlying brain parenchyma. Anterior parafalcine component is noted which measures 3.4 mm in thickness. There is also extension along the left tentorium. Left to right midline shift measures 5 mm. Subarachnoid hemorrhage is identified with hyperdense blood filling the basilar cisterns as well as along the surface of the right frontal lobe and left posterior temporal lobe. There is mild diffuse low-attenuation within the subcortical and periventricular white matter compatible with chronic microvascular disease. Prominence of the sulci and ventricles compatible with brain atrophy. Vascular: No hyperdense vessel or unexpected calcification. Skull: Normal. Negative for fracture or focal lesion. Sinuses/Orbits: Mucoperiosteal thickening involving the right maxillary sinus. Mastoid air cells are clear. Other: None. CT CERVICAL SPINE FINDINGS Alignment: Normal. Skull base  and vertebrae: No acute fracture. No primary bone lesion or focal pathologic process. Soft tissues and spinal canal: No prevertebral fluid or swelling. No visible canal hematoma. Disc levels: Multilevel disc space narrowing and endplate  spurring noted. Most advanced C5-6 and C6-7. Upper chest: Advanced changes of bullous emphysema. ET tube and NG tube are identified Other: None IMPRESSION: 1. Large left sub dural hematoma with mass effect upon the underlying brain parenchyma. Left to right midline shift measures 5 mm. 2. Subarachnoid hemorrhage within the basilar cisterns and along the surface of the right frontal lobe and left posterior temporal lobe. 3. No evidence for cervical spine fracture. 4. Cervical spondylosis Critical Value/emergent results were called by telephone at the time of interpretation on 07/19/2020 at 12:08 pm to provider Dr. Roxan Hockey, who verbally acknowledged these results. Electronically Signed   By: Signa Kell M.D.   On: 06/23/2020 12:07   CT CHEST ABDOMEN PELVIS W CONTRAST  Result Date: 07/03/2020 CLINICAL DATA:  80 year old female with chest, abdominal and pelvic pain following fall and injury today. RIGHT pneumothorax with chest tube placement. EXAM: CT CHEST, ABDOMEN, AND PELVIS WITH CONTRAST TECHNIQUE: Multidetector CT imaging of the chest, abdomen and pelvis was performed following the standard protocol during bolus administration of intravenous contrast. CONTRAST:  80mL OMNIPAQUE IOHEXOL 300 MG/ML  SOLN COMPARISON:  04/08/2020 CTs. 07/18/2020 chest radiograph and prior studies FINDINGS: CT CHEST FINDINGS Cardiovascular: Heart size is normal. Coronary artery and aortic atherosclerotic calcifications are noted without evidence of thoracic aortic aneurysm. A trace amount of pericardial fluid is unchanged. Mediastinum/Nodes: No mediastinal hematoma or mass. No enlarged lymph nodes are identified. An endotracheal tube and NG tube are noted. Lungs/Pleura: A large RIGHT pneumothorax is noted. A RIGHT thoracostomy tube is present within the MEDIAL pleural space. Mild mediastinal shift to the LEFT is noted. A small to moderate RIGHT pleural effusion/hemothorax is noted. Severe emphysema bilaterally is identified. RIGHT  LOWER lobe consolidation and atelectasis are again identified. Consolidation within the posterior RIGHT UPPER lobe is now noted. Chronic interstitial opacities within the LEFT LOWER lobe again noted. Biapical scarring is unchanged. Musculoskeletal: RIGHT subcutaneous emphysema is present. Acute fractures of the posterior RIGHT 3rd, 4th and 5th ribs, anterior RIGHT 1st through 5th ribs and lateral RIGHT 6 and 7th ribs noted. An acute nondisplaced fracture of the RIGHT coracoid process noted. There are multiple bilateral remote rib fractures present. CT ABDOMEN PELVIS FINDINGS Hepatobiliary: Patient is status post cholecystectomy. Mild intrahepatic and CBD prominence is unchanged. No other hepatic abnormalities are noted. Pancreas: No significant change. There is a 1.6 x 2.9 cm oval soft tissue structure in the region of the pancreatic head/duodenum of uncertain clinical significance but may represent a mass. Spleen: Unchanged with small splenic granulomas. No acute abnormality noted. Adrenals/Urinary Tract: The kidneys and adrenal glands are unremarkable except for mild bilateral renal cortical atrophy. The bladder is distended and contains a Foley catheter. Stomach/Bowel: An NG tube tip is noted within the proximal stomach. No bowel obstruction, definite bowel wall thickening or inflammatory changes. Vascular/Lymphatic: Aortic atherosclerosis. No enlarged abdominal or pelvic lymph nodes. Reproductive: No significant abnormality Other: No ascites or pneumoperitoneum. Musculoskeletal: No acute bony abnormality identified within the abdomen or pelvis. Degenerative changes within the lumbar spine, remote L1 compression fracture, grade 2 anterolisthesis of L5 on S1 and ORIF remote RIGHT femur fracture again noted. IMPRESSION: 1. Large RIGHT pneumothorax with RIGHT thoracostomy tube in place. Mild mediastinal shift to the LEFT. Associated small to moderate RIGHT pleural fluid/hemothorax. Dr.  Katrinka Blazing notified of this on  07/19/20 at 12:20 p.m. 2. Acute fractures of the posterior RIGHT 3rd, 4th and 5th ribs, anterior RIGHT 1st through 5th ribs and lateral RIGHT 6 and 7th ribs. 3. Acute nondisplaced fracture of the RIGHT coracoid process. 4. RIGHT LOWER lobe consolidation and atelectasis with new consolidation within the posterior RIGHT UPPER lobe which may represent contusion, pneumonia or aspiration. 5. Indeterminate 1.6 x 2.9 cm oval soft tissue structure in the region of the pancreatic head/duodenum of uncertain clinical significance. A mass is not excluded given appearance. Consider further evaluation with elective MRI with and without contrast as clinically indicated. 6. Distended bladder containing a Foley catheter. 7. Aortic Atherosclerosis (ICD10-I70.0) and Emphysema (ICD10-J43.9). Electronically Signed   By: Harmon Pier M.D.   On: 19-Jul-2020 12:24   CT T-SPINE NO CHARGE  Result Date: 07-19-2020 CLINICAL DATA:  Thoracic spine pain after a fall. Initial encounter. EXAM: CT THORACIC SPINE WITHOUT CONTRAST TECHNIQUE: Multidetector CT images of the thoracic were obtained using the standard protocol without intravenous contrast. COMPARISON:  CT chest 04/08/2020. FINDINGS: Alignment: Maintained. Vertebrae: No acute fracture or focal pathologic process. Remote superior endplate compression fracture of L1 is noted. Paraspinal and other soft tissues: See report of dedicated chest, abdomen and pelvis CT scan today. Multiple remote bilateral rib fractures noted. Disc levels: Intervertebral disc space height is maintained. IMPRESSION: No acute abnormality. Remote L1 superior endplate compression fracture. Electronically Signed   By: Drusilla Kanner M.D.   On: 07-19-2020 12:05   CT L-SPINE NO CHARGE  Result Date: 2020/07/19 CLINICAL DATA:  Patient status post fall. Low back pain. Initial encounter. EXAM: CT LUMBAR SPINE WITHOUT CONTRAST TECHNIQUE: Multidetector CT imaging of the lumbar spine was performed without intravenous  contrast administration. Multiplanar CT image reconstructions were also generated. COMPARISON:  CT abdomen and pelvis 04/08/2020. FINDINGS: Segmentation: Standard. Alignment: 1.3 cm anterolisthesis due to severe facet degenerative disease is unchanged. Vertebrae: Remote superior endplate compression fracture of L1 is unchanged. The patient has a mild inferior endplate compression fracture of L2 with vertebral body height loss anteriorly of approximately 15%. The fracture is new since the prior CT and fracture lines are visible. No involvement of the posterior elements. No other fracture. No worrisome lesion. Paraspinal and other soft tissues: See report of dedicated chest, abdomen and pelvis CT today. Disc levels: T12-L1: Mild retropulsion off the superior endplate of L1 without stenosis. L1-2: Negative. L2-3: Facet degenerative disease and a shallow disc bulge. The central canal appears open. L3-4: Mild-to-moderate facet degenerative disease. Minimal disc bulge. No stenosis. L4-5: Advanced bilateral facet arthropathy. Loss of disc space height and vacuum disc phenomenon. Mild endplate spurring. The central canal and foramina appear open. L5-S1: The disc is uncovered without bulging. The central canal is open. Marked bilateral foraminal narrowing is unchanged. IMPRESSION: Acute appearing mild inferior endplate compression fracture of L2 with vertebral body height loss of up to 15%. Remote superior endplate compression fracture of L1. 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 There is marked bilateral foraminal the narrowing at this level which is unchanged. Electronically Signed   By: Drusilla Kanner M.D.   On: 07/19/2020 11:59   DG Chest Portable 1 View  Result Date: 19-Jul-2020 CLINICAL DATA:  Lungs follow-up chest tube insertion. EXAM: PORTABLE CHEST 1 VIEW COMPARISON:  2020/07/19 FINDINGS: ET tube tip is above the carina. Interval placement of right-sided chest tube. The moderate  lateral and large basilar right pneumothorax are decreased in size when compared  with the previous exam. Extensive bilateral interstitial and airspace densities are again noted. Multiple bilateral rib deformities appear chronic. IMPRESSION: 1. Decrease in size right lateral and large basilar pneumothorax status post chest tube placement. 2. Extensive bilateral interstitial and airspace densities. Electronically Signed   By: Signa Kell M.D.   On: 07/17/2020 10:43    ____________________________________________   PROCEDURES  Procedure(s) performed (including Critical Care):  Procedure Name: Intubation Date/Time: 07/04/2020 10:58 AM Performed by: Gilles Chiquito, MD Pre-anesthesia Checklist: Emergency Drugs available, Timeout performed, Patient being monitored, Patient identified and Suction available Oxygen Delivery Method: Nasal cannula Preoxygenation: Pre-oxygenation with 100% oxygen Induction Type: Rapid sequence Laryngoscope Size: Glidescope Grade View: Grade I Tube size: 7.0 mm Number of attempts: 1 Airway Equipment and Method: Video-laryngoscopy Placement Confirmation: Breath sounds checked- equal and bilateral and ETT inserted through vocal cords under direct vision Secured at: 24 cm Tube secured with: Tape    CHEST TUBE INSERTION  Date/Time: 07/06/2020 10:58 AM Performed by: Gilles Chiquito, MD Authorized by: Gilles Chiquito, MD   Consent:    Consent obtained:  Emergent situation Pre-procedure details:    Skin preparation:  Chlorhexidine   Preparation: Patient was prepped and draped in the usual sterile fashion   Sedation:    Sedation type:  Moderate sedation Anesthesia:    Anesthesia method:  Local infiltration   Local anesthetic:  Lidocaine 1% w/o epi Procedure details:    Placement location:  R lateral   Tube size (Fr):  Minicatheter   Tube connected to:  Suction   Drainage characteristics:  Air only   Dressing:  Xeroform gauze Post-procedure details:     Post-insertion x-ray findings: tube in good position     Procedure completion:  Tolerated well, no immediate complications .Critical Care Performed by: Gilles Chiquito, MD Authorized by: Gilles Chiquito, MD   Critical care provider statement:    Critical care time (minutes):  45   Critical care was necessary to treat or prevent imminent or life-threatening deterioration of the following conditions:  Respiratory failure and trauma   Critical care was time spent personally by me on the following activities:  Discussions with consultants, evaluation of patient's response to treatment, examination of patient, ordering and performing treatments and interventions, ordering and review of laboratory studies, ordering and review of radiographic studies, pulse oximetry, re-evaluation of patient's condition, obtaining history from patient or surrogate and review of old charts     ____________________________________________   INITIAL IMPRESSION / ASSESSMENT AND PLAN / ED COURSE      Patient presents with above-stated exam for assessment after she was found on the floor with a lack to her right head.  On arrival patient on arrival patient is on arrival patient is very tachypneic with respiratory rate in the mid 30s and borderline hypoxic on nonrebreather with SPO2 of 80 to 89%.  She is normotensive and slightly tachycardic.  Shortly after arrival patient was intubated for hypoxic respiratory failure as she had significant increased work of breathing after minimal ambulation from EMS stretcher to ED bed.  Portable chest x-ray obtained stat showed evidence of rib fractures and right-sided pneumothorax.  Chest tube placed.  Procedure note above.  On review of patient's medical records she does not appear to be on any blood thinners.  No other obvious gross trauma on exam of the extremities chest abdomen or scalp.  CMP remarkable for no significant electrolyte or metabolic derangements.  Lactic acid  1.9.  CBC remarkable for  WBC count of 16.2, hemoglobin of 8.7 compared to 9.23 months ago and unremarkable platelets.  INR is 1.1.  VBG with a pH of 7.26 with a PCO2 of 56 bicarb 25.1  Cultures obtained patient covered empirically for sepsis.  Given severity of injuries and respiratory failure I did reach out to trauma service at [facility] patient was accepted for transfer by Dr. Sheliah Hatch.  I also discussed patient presentation and initial work-up with Dr. Frederick Peers, ED physician at [facility].  Patient transferred prior to CTs resulting in critical condition for further evaluation management.      ____________________________________________   FINAL CLINICAL IMPRESSION(S) / ED DIAGNOSES  Final diagnoses:  Fall  Traumatic pneumothorax, initial encounter  SDH (subdural hematoma) (HCC)  Subarachnoid bleed (HCC)  Closed fracture of multiple ribs of right side, initial encounter    Medications  Tdap (BOOSTRIX) injection 0.5 mL (0.5 mLs Intramuscular Given 08/01/20 1141)  Ampicillin-Sulbactam (UNASYN) 3 g in sodium chloride 0.9 % 100 mL IVPB (0 g Intravenous Stopped 2020-08-01 1140)  iohexol (OMNIPAQUE) 300 MG/ML solution 75 mL (75 mLs Intravenous Contrast Given 08-01-2020 0907)  etomidate (AMIDATE) injection 30 mg (30 mg Intravenous Given 08-01-2020 0924)  rocuronium (ZEMURON) injection 100 mg (100 mg Intravenous Given 08-01-2020 0924)  fentaNYL (SUBLIMAZE) injection 25 mcg (25 mcg Intravenous Given 08/01/2020 0955)  fentaNYL (SUBLIMAZE) injection 25 mcg (25 mcg Intravenous Given 2020/08/01 1013)     ED Discharge Orders    None       Note:  This document was prepared using Dragon voice recognition software and may include unintentional dictation errors.   Gilles Chiquito, MD 08-01-2020 1323

## 2020-07-11 NOTE — Progress Notes (Signed)
Able to contact Alice Wallace at [phone]  She confirmed that she had a DNR signed in 2021. Upon review this was confirmed in multiple [facility] hospitalizations. Since she is currently intubated we discussed options going forward. At this time we will provide non-escalation of care and discuss further with the legal guardian group tomorrow as potential compassionate/terminal extubation requires higher level decisions.

## 2020-07-11 NOTE — ED Notes (Signed)
edp aware of pt bp

## 2020-07-11 NOTE — ED Triage Notes (Signed)
Pt here via carelink from [facility] for trauma/NSG consults. Pt vented on arrival with chest tube, OG tube and temp foley in place. Propofol and fluids running. Given 45 roc and 50 fentanyl en route.

## 2020-07-11 NOTE — ED Notes (Signed)
Asher Muir, RN charge at [facility] confirmed space available for patient. Carelink at bedside awaiting patient to return from CT

## 2020-07-11 NOTE — ED Notes (Signed)
Before going to CT this RN attempted to take pt off nonrebreather at 15L, placed on baseline 4 L [place]. desated to 85%, increased to 6L, only satting 88%. Pt appeared air hungry, placed back on nonrebreather. Pt nodded that felt better at 15L.    Pt brought back from CT. Unable to lie flat for scans.

## 2020-07-11 NOTE — ED Provider Notes (Signed)
Patient was transferred from [facility] after being found down, noted to have multiple rib fx, R PTX, hypoxia requiring intubation and chest tube prior to transfer. On propofol, given fentanyl and roc during transport.   On arrival, pt sedated and intubated, GCS 3T. Normal BP and stable on current vent settings. VBG shows 7.82/pCO2 15; called RT to adjust vent settings.  Reviewed CT head through pelvis which was also notable for large SDH/SAH w/ midline shift. Consulted NSGY, Dr. Lovell Sheehan. Consulted trauma, Dr. Sheliah Hatch. He plans to exchange pigtail for larger chest tube. CXR confirms ETT placement and NGT. Pt admitted in critical condition. Unfortunately have not been able to contact any family/guardian.  CRITICAL CARE Performed by: Ambrose Finland Ukiah Trawick   Total critical care time: 35 minutes  Critical care time was exclusive of separately billable procedures and treating other patients.  Critical care was necessary to treat or prevent imminent or life-threatening deterioration.  Critical care was time spent personally by me on the following activities: development of treatment plan with patient and/or surrogate as well as nursing, discussions with consultants, evaluation of patient's response to treatment, examination of patient, obtaining history from patient or surrogate, ordering and performing treatments and interventions, ordering and review of laboratory studies, ordering and review of radiographic studies, pulse oximetry and re-evaluation of patient's condition.    Abdon Petrosky, Ambrose Finland, MD 06/27/2020 [phone]

## 2020-07-11 NOTE — Progress Notes (Signed)
Changed vent settings due to VBG results.

## 2020-07-11 NOTE — H&P (Signed)
Activation and Reason: transfer, fall  Primary Survey: intubated, right chest tube in place with +air leak, normal blood pressure and +distal pulses  Alice Wallace is an 80 y.o. female.  HPI: 80 yo female from nursing home had unwitnessed fall. At [facility] she was found to have respiratory failure and right large pneumothorax with multiple rib fractures.  Past Medical History:  Diagnosis Date  . COPD (chronic obstructive pulmonary disease) (HCC)   . Deaf   . Hypertension   . Osteoporosis     Past Surgical History:  Procedure Laterality Date  . INTRAMEDULLARY (IM) NAIL INTERTROCHANTERIC Right 01/26/2020   Procedure: Right Hip IM nail with TFNA;  Surgeon: Lyndle HerrlichBowers, James R, MD;  Location: [facility] ORS;  Service: Orthopedics;  Laterality: Right;    No family history on file.  Social History:  reports that she has never smoked. She has never used smokeless tobacco. She reports previous alcohol use. She reports previous drug use.  Allergies:  Allergies  Allergen Reactions  . Hydrocodone     Other reaction(s): Unknown  . Naproxen     Other reaction(s): Unknown  . Other Other (See Comments)    Nuts-   . Sulfamethoxazole-Trimethoprim     Other reaction(s): Unknown  . Tramadol     Other reaction(s): Unknown Pt has tolerated this med 1/5-1/7 with no issues    Medications: I have reviewed the patient's current medications.  Results for orders placed or performed during the hospital encounter of 07/08/2020 (from the past 48 hour(s))  I-Stat venous blood gas, [facility]     Status: Abnormal   Collection Time: 06/30/2020  1:30 PM  Result Value Ref Range   pH, Ven 7.823 (HH) 7.250 - 7.430   pCO2, Ven <15.0 (LL) 44.0 - 60.0 mmHg   pO2, Ven 126.0 (H) 32.0 - 45.0 mmHg   Bicarbonate 22.7 20.0 - 28.0 mmol/L   TCO2 23 22 - 32 mmol/L   O2 Saturation 100.0 %   Acid-Base Excess 1.0 0.0 - 2.0 mmol/L   Sodium 134 (L) 135 - 145 mmol/L   Potassium 3.8 3.5 - 5.1 mmol/L   Calcium, Ion 0.85 (LL) 1.15  - 1.40 mmol/L   HCT 22.0 (L) 36.0 - 46.0 %   Hemoglobin 7.5 (L) 12.0 - 15.0 g/dL   Patient temperature 16.122.0 C    Sample type VENOUS    Comment NOTIFIED PHYSICIAN     DG Chest 1 View  Result Date: 07/01/2020 CLINICAL DATA:  80 year old female with shortness of breath and fall. EXAM: CHEST  1 VIEW COMPARISON:  04/08/2020 CT and prior studies FINDINGS: A RIGHT pneumothorax is noted with moderate lateral component and large basilar component. There is tethering of the mid-lower LEFT lung to the pleura. An acute fracture of the posterior RIGHT 4th rib is noted. Emphysema/COPD changes are noted. Multiple remote bilateral rib fractures are present. Mild LEFT basilar atelectasis/opacities noted. An endotracheal tube is present with tip 5.5 cm above the carina. IMPRESSION: 1. RIGHT pneumothorax with moderate lateral and large basilar component. 2. Acute fracture of the posterior RIGHT 4th rib. 3. LEFT basilar atelectasis/opacities. 4.  Emphysema (ICD10-J43.9). Critical Value/emergent results were called by telephone at the time of interpretation on 06/25/2020 at 9:55 am to provider [facility] , who verbally acknowledged these results. Electronically Signed   By: Harmon PierJeffrey  Hu M.D.   On: 06/23/2020 09:58   CT Head Wo Contrast  Result Date: 06/24/2020 CLINICAL DATA:  Unwitnessed fall. EXAM: CT HEAD WITHOUT CONTRAST  CT CERVICAL SPINE WITHOUT CONTRAST TECHNIQUE: Multidetector CT imaging of the head and cervical spine was performed following the standard protocol without intravenous contrast. Multiplanar CT image reconstructions of the cervical spine were also generated. COMPARISON:  None. FINDINGS: CT HEAD FINDINGS Brain: There is a large left subdural hematoma overlying the left cerebral hemisphere. This measures 1.2 cm in maximum thickness and has mass effect upon the underlying brain parenchyma. Anterior parafalcine component is noted which measures 3.4 mm in thickness. There is also extension along the left  tentorium. Left to right midline shift measures 5 mm. Subarachnoid hemorrhage is identified with hyperdense blood filling the basilar cisterns as well as along the surface of the right frontal lobe and left posterior temporal lobe. There is mild diffuse low-attenuation within the subcortical and periventricular white matter compatible with chronic microvascular disease. Prominence of the sulci and ventricles compatible with brain atrophy. Vascular: No hyperdense vessel or unexpected calcification. Skull: Normal. Negative for fracture or focal lesion. Sinuses/Orbits: Mucoperiosteal thickening involving the right maxillary sinus. Mastoid air cells are clear. Other: None. CT CERVICAL SPINE FINDINGS Alignment: Normal. Skull base and vertebrae: No acute fracture. No primary bone lesion or focal pathologic process. Soft tissues and spinal canal: No prevertebral fluid or swelling. No visible canal hematoma. Disc levels: Multilevel disc space narrowing and endplate spurring noted. Most advanced C5-6 and C6-7. Upper chest: Advanced changes of bullous emphysema. ET tube and NG tube are identified Other: None IMPRESSION: 1. Large left sub dural hematoma with mass effect upon the underlying brain parenchyma. Left to right midline shift measures 5 mm. 2. Subarachnoid hemorrhage within the basilar cisterns and along the surface of the right frontal lobe and left posterior temporal lobe. 3. No evidence for cervical spine fracture. 4. Cervical spondylosis Critical Value/emergent results were called by telephone at the time of interpretation on 07/16/2020 at 12:08 pm to provider Dr. Roxan Hockey, who verbally acknowledged these results. Electronically Signed   By: Signa Kell M.D.   On: 2020-07-16 12:07   CT Cervical Spine Wo Contrast  Result Date: Jul 16, 2020 CLINICAL DATA:  Unwitnessed fall. EXAM: CT HEAD WITHOUT CONTRAST CT CERVICAL SPINE WITHOUT CONTRAST TECHNIQUE: Multidetector CT imaging of the head and cervical spine was  performed following the standard protocol without intravenous contrast. Multiplanar CT image reconstructions of the cervical spine were also generated. COMPARISON:  None. FINDINGS: CT HEAD FINDINGS Brain: There is a large left subdural hematoma overlying the left cerebral hemisphere. This measures 1.2 cm in maximum thickness and has mass effect upon the underlying brain parenchyma. Anterior parafalcine component is noted which measures 3.4 mm in thickness. There is also extension along the left tentorium. Left to right midline shift measures 5 mm. Subarachnoid hemorrhage is identified with hyperdense blood filling the basilar cisterns as well as along the surface of the right frontal lobe and left posterior temporal lobe. There is mild diffuse low-attenuation within the subcortical and periventricular white matter compatible with chronic microvascular disease. Prominence of the sulci and ventricles compatible with brain atrophy. Vascular: No hyperdense vessel or unexpected calcification. Skull: Normal. Negative for fracture or focal lesion. Sinuses/Orbits: Mucoperiosteal thickening involving the right maxillary sinus. Mastoid air cells are clear. Other: None. CT CERVICAL SPINE FINDINGS Alignment: Normal. Skull base and vertebrae: No acute fracture. No primary bone lesion or focal pathologic process. Soft tissues and spinal canal: No prevertebral fluid or swelling. No visible canal hematoma. Disc levels: Multilevel disc space narrowing and endplate spurring noted. Most advanced C5-6 and C6-7. Upper chest: Advanced  changes of bullous emphysema. ET tube and NG tube are identified Other: None IMPRESSION: 1. Large left sub dural hematoma with mass effect upon the underlying brain parenchyma. Left to right midline shift measures 5 mm. 2. Subarachnoid hemorrhage within the basilar cisterns and along the surface of the right frontal lobe and left posterior temporal lobe. 3. No evidence for cervical spine fracture. 4.  Cervical spondylosis Critical Value/emergent results were called by telephone at the time of interpretation on 15-Jul-2020 at 12:08 pm to provider Dr. Roxan Hockey, who verbally acknowledged these results. Electronically Signed   By: Signa Kell M.D.   On: 07-15-20 12:07   CT CHEST ABDOMEN PELVIS W CONTRAST  Result Date: 07/15/20 CLINICAL DATA:  80 year old female with chest, abdominal and pelvic pain following fall and injury today. RIGHT pneumothorax with chest tube placement. EXAM: CT CHEST, ABDOMEN, AND PELVIS WITH CONTRAST TECHNIQUE: Multidetector CT imaging of the chest, abdomen and pelvis was performed following the standard protocol during bolus administration of intravenous contrast. CONTRAST:  75mL OMNIPAQUE IOHEXOL 300 MG/ML  SOLN COMPARISON:  04/08/2020 CTs. 07/15/20 chest radiograph and prior studies FINDINGS: CT CHEST FINDINGS Cardiovascular: Heart size is normal. Coronary artery and aortic atherosclerotic calcifications are noted without evidence of thoracic aortic aneurysm. A trace amount of pericardial fluid is unchanged. Mediastinum/Nodes: No mediastinal hematoma or mass. No enlarged lymph nodes are identified. An endotracheal tube and NG tube are noted. Lungs/Pleura: A large RIGHT pneumothorax is noted. A RIGHT thoracostomy tube is present within the MEDIAL pleural space. Mild mediastinal shift to the LEFT is noted. A small to moderate RIGHT pleural effusion/hemothorax is noted. Severe emphysema bilaterally is identified. RIGHT LOWER lobe consolidation and atelectasis are again identified. Consolidation within the posterior RIGHT UPPER lobe is now noted. Chronic interstitial opacities within the LEFT LOWER lobe again noted. Biapical scarring is unchanged. Musculoskeletal: RIGHT subcutaneous emphysema is present. Acute fractures of the posterior RIGHT 3rd, 4th and 5th ribs, anterior RIGHT 1st through 5th ribs and lateral RIGHT 6 and 7th ribs noted. An acute nondisplaced fracture of the RIGHT  coracoid process noted. There are multiple bilateral remote rib fractures present. CT ABDOMEN PELVIS FINDINGS Hepatobiliary: Patient is status post cholecystectomy. Mild intrahepatic and CBD prominence is unchanged. No other hepatic abnormalities are noted. Pancreas: No significant change. There is a 1.6 x 2.9 cm oval soft tissue structure in the region of the pancreatic head/duodenum of uncertain clinical significance but may represent a mass. Spleen: Unchanged with small splenic granulomas. No acute abnormality noted. Adrenals/Urinary Tract: The kidneys and adrenal glands are unremarkable except for mild bilateral renal cortical atrophy. The bladder is distended and contains a Foley catheter. Stomach/Bowel: An NG tube tip is noted within the proximal stomach. No bowel obstruction, definite bowel wall thickening or inflammatory changes. Vascular/Lymphatic: Aortic atherosclerosis. No enlarged abdominal or pelvic lymph nodes. Reproductive: No significant abnormality Other: No ascites or pneumoperitoneum. Musculoskeletal: No acute bony abnormality identified within the abdomen or pelvis. Degenerative changes within the lumbar spine, remote L1 compression fracture, grade 2 anterolisthesis of L5 on S1 and ORIF remote RIGHT femur fracture again noted. IMPRESSION: 1. Large RIGHT pneumothorax with RIGHT thoracostomy tube in place. Mild mediastinal shift to the LEFT. Associated small to moderate RIGHT pleural fluid/hemothorax. Dr. Katrinka Blazing notified of this on July 15, 2020 at 12:20 p.m. 2. Acute fractures of the posterior RIGHT 3rd, 4th and 5th ribs, anterior RIGHT 1st through 5th ribs and lateral RIGHT 6 and 7th ribs. 3. Acute nondisplaced fracture of the RIGHT coracoid process. 4. RIGHT  LOWER lobe consolidation and atelectasis with new consolidation within the posterior RIGHT UPPER lobe which may represent contusion, pneumonia or aspiration. 5. Indeterminate 1.6 x 2.9 cm oval soft tissue structure in the region of the  pancreatic head/duodenum of uncertain clinical significance. A mass is not excluded given appearance. Consider further evaluation with elective MRI with and without contrast as clinically indicated. 6. Distended bladder containing a Foley catheter. 7. Aortic Atherosclerosis (ICD10-I70.0) and Emphysema (ICD10-J43.9). Electronically Signed   By: Harmon Pier M.D.   On: 2020-08-09 12:24   CT T-SPINE NO CHARGE  Result Date: 08-09-20 CLINICAL DATA:  Thoracic spine pain after a fall. Initial encounter. EXAM: CT THORACIC SPINE WITHOUT CONTRAST TECHNIQUE: Multidetector CT images of the thoracic were obtained using the standard protocol without intravenous contrast. COMPARISON:  CT chest 04/08/2020. FINDINGS: Alignment: Maintained. Vertebrae: No acute fracture or focal pathologic process. Remote superior endplate compression fracture of L1 is noted. Paraspinal and other soft tissues: See report of dedicated chest, abdomen and pelvis CT scan today. Multiple remote bilateral rib fractures noted. Disc levels: Intervertebral disc space height is maintained. IMPRESSION: No acute abnormality. Remote L1 superior endplate compression fracture. Electronically Signed   By: Drusilla Kanner M.D.   On: 2020/08/09 12:05   CT L-SPINE NO CHARGE  Result Date: 2020/08/09 CLINICAL DATA:  Patient status post fall. Low back pain. Initial encounter. EXAM: CT LUMBAR SPINE WITHOUT CONTRAST TECHNIQUE: Multidetector CT imaging of the lumbar spine was performed without intravenous contrast administration. Multiplanar CT image reconstructions were also generated. COMPARISON:  CT abdomen and pelvis 04/08/2020. FINDINGS: Segmentation: Standard. Alignment: 1.3 cm anterolisthesis due to severe facet degenerative disease is unchanged. Vertebrae: Remote superior endplate compression fracture of L1 is unchanged. The patient has a mild inferior endplate compression fracture of L2 with vertebral body height loss anteriorly of approximately 15%. The  fracture is new since the prior CT and fracture lines are visible. No involvement of the posterior elements. No other fracture. No worrisome lesion. Paraspinal and other soft tissues: See report of dedicated chest, abdomen and pelvis CT today. Disc levels: T12-L1: Mild retropulsion off the superior endplate of L1 without stenosis. L1-2: Negative. L2-3: Facet degenerative disease and a shallow disc bulge. The central canal appears open. L3-4: Mild-to-moderate facet degenerative disease. Minimal disc bulge. No stenosis. L4-5: Advanced bilateral facet arthropathy. Loss of disc space height and vacuum disc phenomenon. Mild endplate spurring. The central canal and foramina appear open. L5-S1: The disc is uncovered without bulging. The central canal is open. Marked bilateral foraminal narrowing is unchanged. IMPRESSION: Acute appearing mild inferior endplate compression fracture of L2 with vertebral body height loss of up to 15%. Remote superior endplate compression fracture of L1. 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 There is marked bilateral foraminal the narrowing at this level which is unchanged. Electronically Signed   By: Drusilla Kanner M.D.   On: August 09, 2020 11:59   DG Chest Port 1 View  Result Date: August 09, 2020 CLINICAL DATA:  Status post intubation and chest tube placement. EXAM: PORTABLE CHEST 1 VIEW COMPARISON:  Single-view of the chest earlier today. FINDINGS: Endotracheal tube is in good position with the tip at the level of clavicular heads. A new NG tube courses into the stomach and below the inferior margin of the film. Pigtail catheter is again seen in the right chest but the loop is now more peripheral. The patient's right pneumothorax appears unchanged. No midline shift is identified. The lungs are severely emphysematous. Small right effusion  and right worse than left basilar airspace disease are unchanged. IMPRESSION: The patient's right chest tube has been  repositioned with the loop now more peripheral in the chest. Large right pneumothorax is unchanged. ETT in good position. New NG tube courses into the stomach and below the inferior margin the film. Severe emphysema and right worse than left basilar airspace disease. Electronically Signed   By: Drusilla Kanner M.D.   On: 07/10/2020 13:52   DG Chest Portable 1 View  Result Date: 07/17/2020 CLINICAL DATA:  Lungs follow-up chest tube insertion. EXAM: PORTABLE CHEST 1 VIEW COMPARISON:  07/03/2020 FINDINGS: ET tube tip is above the carina. Interval placement of right-sided chest tube. The moderate lateral and large basilar right pneumothorax are decreased in size when compared with the previous exam. Extensive bilateral interstitial and airspace densities are again noted. Multiple bilateral rib deformities appear chronic. IMPRESSION: 1. Decrease in size right lateral and large basilar pneumothorax status post chest tube placement. 2. Extensive bilateral interstitial and airspace densities. Electronically Signed   By: Signa Kell M.D.   On: 07/01/2020 10:43    Review of Systems  Unable to perform ROS: Acuity of condition    PE Blood pressure (!) 149/61, pulse (!) 103, temperature 98.1 F (36.7 C), resp. rate (!) 23, height 5\' 5"  (1.651 m), weight 43.5 kg, SpO2 96 %. Constitutional: minimal response; no deformities Eyes: Moist conjunctiva; no lid lag; anicteric; PERRL Neck: Trachea midline; no thyromegaly, collar in place Lungs: assisted ventilation; no tactile fremitus CV: tachycardic; no palpable thrills; no pitting edema GI: Abd nondistended; no palpable hepatosplenomegaly MSK: unable to assess gait; no clubbing, bilateral lower extremity pitting edema, +sacral decubitus ulcers, bruises on all extremities Psychiatric: Appropriate affect; alert and oriented x3 Lymphatic: No palpable cervical or axillary lymphadenopathy   Assessment/Plan: 80 yo female fall from standing with rib fractures,  SDH, PTX -admit to trauma ICu -consult NSG who agrees she has a large bleed but given baseline health does not think intervention is appropriate -will continue to attempt contacting legal guardian for goals of care  Procedures: Right chest tube inserted at bedside for persistent PTX  76 Alice Wallace 07/15/2020, 3:26 PM

## 2020-07-11 NOTE — Progress Notes (Signed)
Patient came in intubated from an outside hospital with a 7.0 ETT taped at 22 cm at the lip, good BBS ausculted, SATS 100% placed on above vent settings per [facility] net protocol, will continue to monitor patient.

## 2020-07-11 NOTE — Consult Note (Signed)
Reason for Consult: Left subdural hematoma Referring Physician: Dr. Suzanne Boron Bardin is an 80 y.o. female.  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  By report she was found unconscious and hypoxic at the nursing home.  She was transported to [facility] where she was found to have a right pneumothorax which was treated with a chest tube.  She was also found to have a left subdural hematoma.  She was transferred to [facility] trauma service and a neurosurgical consultation was requested.  Presently the patient is intubated and in no apparent distress.  I left a message for her legal guardian but was not able to speak to her.  Past Medical History:  Diagnosis Date  . COPD (chronic obstructive pulmonary disease) (HCC)   . Deaf   . Hypertension   . Osteoporosis     Past Surgical History:  Procedure Laterality Date  . INTRAMEDULLARY (IM) NAIL INTERTROCHANTERIC Right 01/26/2020   Procedure: Right Hip IM nail with TFNA;  Surgeon: Lyndle Herrlich, MD;  Location: [facility] ORS;  Service: Orthopedics;  Laterality: Right;    No family history on file.  Social History:  reports that she has never smoked. She has never used smokeless tobacco. She reports previous alcohol use. She reports previous drug use.  Allergies:  Allergies  Allergen Reactions  . Hydrocodone     Other reaction(s): Unknown  . Naproxen     Other reaction(s): Unknown  . Other Other (See Comments)    Nuts-   . Sulfamethoxazole-Trimethoprim     Other reaction(s): Unknown  . Tramadol     Other reaction(s): Unknown Pt has tolerated this med 1/5-1/7 with no issues    Medications:  I have reviewed the patient's current medications. Prior to Admission: (Not in a hospital  admission)  Scheduled: Continuous: PRN: Anti-infectives (From admission, onward)   None       Results for orders placed or performed during the hospital encounter of 06/27/2020 (from the past 48 hour(s))  I-Stat venous blood gas, [facility] ED only)     Status: Abnormal   Collection Time: 06/23/2020  1:30 PM  Result Value Ref Range   pH, Ven 7.823 (HH) 7.250 - 7.430   pCO2, Ven <15.0 (LL) 44.0 - 60.0 mmHg   pO2, Ven 126.0 (H) 32.0 - 45.0 mmHg   Bicarbonate 22.7 20.0 - 28.0 mmol/L   TCO2 23 22 - 32 mmol/L   O2 Saturation 100.0 %   Acid-Base Excess 1.0 0.0 - 2.0 mmol/L   Sodium 134 (L) 135 - 145 mmol/L   Potassium 3.8 3.5 - 5.1 mmol/L   Calcium, Ion 0.85 (LL) 1.15 - 1.40 mmol/L   HCT 22.0 (L) 36.0 - 46.0 %   Hemoglobin 7.5 (L) 12.0 - 15.0 g/dL   Patient temperature 95.2 C    Sample type VENOUS    Comment NOTIFIED PHYSICIAN     DG Chest 1 View  Result Date: 06/27/2020 CLINICAL DATA:  80 year old female with shortness of breath and fall. EXAM: CHEST  1 VIEW COMPARISON:  04/08/2020 CT and prior studies FINDINGS: A RIGHT pneumothorax is noted with moderate lateral component and large basilar component. There is tethering of the mid-lower LEFT lung to the pleura. An acute fracture of the posterior RIGHT 4th rib is noted. Emphysema/COPD changes are noted. Multiple remote bilateral  rib fractures are present. Mild LEFT basilar atelectasis/opacities noted. An endotracheal tube is present with tip 5.5 cm above the carina. IMPRESSION: 1. RIGHT pneumothorax with moderate lateral and large basilar component. 2. Acute fracture of the posterior RIGHT 4th rib. 3. LEFT basilar atelectasis/opacities. 4.  Emphysema (ICD10-J43.9). Critical Value/emergent results were called by telephone at the time of interpretation on 07/18/2020 at 9:55 am to provider [facility] , who verbally acknowledged these results. Electronically Signed   By: Harmon Pier M.D.   On: 07/09/2020 09:58   CT Head Wo Contrast  Result Date:  07/05/2020 CLINICAL DATA:  Unwitnessed fall. EXAM: CT HEAD WITHOUT CONTRAST CT CERVICAL SPINE WITHOUT CONTRAST TECHNIQUE: Multidetector CT imaging of the head and cervical spine was performed following the standard protocol without intravenous contrast. Multiplanar CT image reconstructions of the cervical spine were also generated. COMPARISON:  None. FINDINGS: CT HEAD FINDINGS Brain: There is a large left subdural hematoma overlying the left cerebral hemisphere. This measures 1.2 cm in maximum thickness and has mass effect upon the underlying brain parenchyma. Anterior parafalcine component is noted which measures 3.4 mm in thickness. There is also extension along the left tentorium. Left to right midline shift measures 5 mm. Subarachnoid hemorrhage is identified with hyperdense blood filling the basilar cisterns as well as along the surface of the right frontal lobe and left posterior temporal lobe. There is mild diffuse low-attenuation within the subcortical and periventricular white matter compatible with chronic microvascular disease. Prominence of the sulci and ventricles compatible with brain atrophy. Vascular: No hyperdense vessel or unexpected calcification. Skull: Normal. Negative for fracture or focal lesion. Sinuses/Orbits: Mucoperiosteal thickening involving the right maxillary sinus. Mastoid air cells are clear. Other: None. CT CERVICAL SPINE FINDINGS Alignment: Normal. Skull base and vertebrae: No acute fracture. No primary bone lesion or focal pathologic process. Soft tissues and spinal canal: No prevertebral fluid or swelling. No visible canal hematoma. Disc levels: Multilevel disc space narrowing and endplate spurring noted. Most advanced C5-6 and C6-7. Upper chest: Advanced changes of bullous emphysema. ET tube and NG tube are identified Other: None IMPRESSION: 1. Large left sub dural hematoma with mass effect upon the underlying brain parenchyma. Left to right midline shift measures 5 mm. 2.  Subarachnoid hemorrhage within the basilar cisterns and along the surface of the right frontal lobe and left posterior temporal lobe. 3. No evidence for cervical spine fracture. 4. Cervical spondylosis Critical Value/emergent results were called by telephone at the time of interpretation on 06/28/2020 at 12:08 pm to provider Dr. Roxan Hockey, who verbally acknowledged these results. Electronically Signed   By: Signa Kell M.D.   On: 07/06/2020 12:07   CT Cervical Spine Wo Contrast  Result Date: 06/30/2020 CLINICAL DATA:  Unwitnessed fall. EXAM: CT HEAD WITHOUT CONTRAST CT CERVICAL SPINE WITHOUT CONTRAST TECHNIQUE: Multidetector CT imaging of the head and cervical spine was performed following the standard protocol without intravenous contrast. Multiplanar CT image reconstructions of the cervical spine were also generated. COMPARISON:  None. FINDINGS: CT HEAD FINDINGS Brain: There is a large left subdural hematoma overlying the left cerebral hemisphere. This measures 1.2 cm in maximum thickness and has mass effect upon the underlying brain parenchyma. Anterior parafalcine component is noted which measures 3.4 mm in thickness. There is also extension along the left tentorium. Left to right midline shift measures 5 mm. Subarachnoid hemorrhage is identified with hyperdense blood filling the basilar cisterns as well as along the surface of the right frontal lobe and left posterior temporal lobe.  There is mild diffuse low-attenuation within the subcortical and periventricular white matter compatible with chronic microvascular disease. Prominence of the sulci and ventricles compatible with brain atrophy. Vascular: No hyperdense vessel or unexpected calcification. Skull: Normal. Negative for fracture or focal lesion. Sinuses/Orbits: Mucoperiosteal thickening involving the right maxillary sinus. Mastoid air cells are clear. Other: None. CT CERVICAL SPINE FINDINGS Alignment: Normal. Skull base and vertebrae: No acute  fracture. No primary bone lesion or focal pathologic process. Soft tissues and spinal canal: No prevertebral fluid or swelling. No visible canal hematoma. Disc levels: Multilevel disc space narrowing and endplate spurring noted. Most advanced C5-6 and C6-7. Upper chest: Advanced changes of bullous emphysema. ET tube and NG tube are identified Other: None IMPRESSION: 1. Large left sub dural hematoma with mass effect upon the underlying brain parenchyma. Left to right midline shift measures 5 mm. 2. Subarachnoid hemorrhage within the basilar cisterns and along the surface of the right frontal lobe and left posterior temporal lobe. 3. No evidence for cervical spine fracture. 4. Cervical spondylosis Critical Value/emergent results were called by telephone at the time of interpretation on 07/01/2020 at 12:08 pm to provider Dr. Roxan Hockeyobinson, who verbally acknowledged these results. Electronically Signed   By: Signa Kellaylor  Stroud M.D.   On: 06/28/2020 12:07   CT CHEST ABDOMEN PELVIS W CONTRAST  Result Date: 07/04/2020 CLINICAL DATA:  80 year old female with chest, abdominal and pelvic pain following fall and injury today. RIGHT pneumothorax with chest tube placement. EXAM: CT CHEST, ABDOMEN, AND PELVIS WITH CONTRAST TECHNIQUE: Multidetector CT imaging of the chest, abdomen and pelvis was performed following the standard protocol during bolus administration of intravenous contrast. CONTRAST:  75mL OMNIPAQUE IOHEXOL 300 MG/ML  SOLN COMPARISON:  04/08/2020 CTs. 07/16/2020 chest radiograph and prior studies FINDINGS: CT CHEST FINDINGS Cardiovascular: Heart size is normal. Coronary artery and aortic atherosclerotic calcifications are noted without evidence of thoracic aortic aneurysm. A trace amount of pericardial fluid is unchanged. Mediastinum/Nodes: No mediastinal hematoma or mass. No enlarged lymph nodes are identified. An endotracheal tube and NG tube are noted. Lungs/Pleura: A large RIGHT pneumothorax is noted. A RIGHT  thoracostomy tube is present within the MEDIAL pleural space. Mild mediastinal shift to the LEFT is noted. A small to moderate RIGHT pleural effusion/hemothorax is noted. Severe emphysema bilaterally is identified. RIGHT LOWER lobe consolidation and atelectasis are again identified. Consolidation within the posterior RIGHT UPPER lobe is now noted. Chronic interstitial opacities within the LEFT LOWER lobe again noted. Biapical scarring is unchanged. Musculoskeletal: RIGHT subcutaneous emphysema is present. Acute fractures of the posterior RIGHT 3rd, 4th and 5th ribs, anterior RIGHT 1st through 5th ribs and lateral RIGHT 6 and 7th ribs noted. An acute nondisplaced fracture of the RIGHT coracoid process noted. There are multiple bilateral remote rib fractures present. CT ABDOMEN PELVIS FINDINGS Hepatobiliary: Patient is status post cholecystectomy. Mild intrahepatic and CBD prominence is unchanged. No other hepatic abnormalities are noted. Pancreas: No significant change. There is a 1.6 x 2.9 cm oval soft tissue structure in the region of the pancreatic head/duodenum of uncertain clinical significance but may represent a mass. Spleen: Unchanged with small splenic granulomas. No acute abnormality noted. Adrenals/Urinary Tract: The kidneys and adrenal glands are unremarkable except for mild bilateral renal cortical atrophy. The bladder is distended and contains a Foley catheter. Stomach/Bowel: An NG tube tip is noted within the proximal stomach. No bowel obstruction, definite bowel wall thickening or inflammatory changes. Vascular/Lymphatic: Aortic atherosclerosis. No enlarged abdominal or pelvic lymph nodes. Reproductive: No significant abnormality Other:  No ascites or pneumoperitoneum. Musculoskeletal: No acute bony abnormality identified within the abdomen or pelvis. Degenerative changes within the lumbar spine, remote L1 compression fracture, grade 2 anterolisthesis of L5 on S1 and ORIF remote RIGHT femur fracture  again noted. IMPRESSION: 1. Large RIGHT pneumothorax with RIGHT thoracostomy tube in place. Mild mediastinal shift to the LEFT. Associated small to moderate RIGHT pleural fluid/hemothorax. Dr. Katrinka Blazing notified of this on 07/10/2020 at 12:20 p.m. 2. Acute fractures of the posterior RIGHT 3rd, 4th and 5th ribs, anterior RIGHT 1st through 5th ribs and lateral RIGHT 6 and 7th ribs. 3. Acute nondisplaced fracture of the RIGHT coracoid process. 4. RIGHT LOWER lobe consolidation and atelectasis with new consolidation within the posterior RIGHT UPPER lobe which may represent contusion, pneumonia or aspiration. 5. Indeterminate 1.6 x 2.9 cm oval soft tissue structure in the region of the pancreatic head/duodenum of uncertain clinical significance. A mass is not excluded given appearance. Consider further evaluation with elective MRI with and without contrast as clinically indicated. 6. Distended bladder containing a Foley catheter. 7. Aortic Atherosclerosis (ICD10-I70.0) and Emphysema (ICD10-J43.9). Electronically Signed   By: Harmon Pier M.D.   On: 06/25/2020 12:24   CT T-SPINE NO CHARGE  Result Date: 07/08/2020 CLINICAL DATA:  Thoracic spine pain after a fall. Initial encounter. EXAM: CT THORACIC SPINE WITHOUT CONTRAST TECHNIQUE: Multidetector CT images of the thoracic were obtained using the standard protocol without intravenous contrast. COMPARISON:  CT chest 04/08/2020. FINDINGS: Alignment: Maintained. Vertebrae: No acute fracture or focal pathologic process. Remote superior endplate compression fracture of L1 is noted. Paraspinal and other soft tissues: See report of dedicated chest, abdomen and pelvis CT scan today. Multiple remote bilateral rib fractures noted. Disc levels: Intervertebral disc space height is maintained. IMPRESSION: No acute abnormality. Remote L1 superior endplate compression fracture. Electronically Signed   By: Drusilla Kanner M.D.   On: 06/30/2020 12:05   CT L-SPINE NO CHARGE  Result Date:  06/27/2020 CLINICAL DATA:  Patient status post fall. Low back pain. Initial encounter. EXAM: CT LUMBAR SPINE WITHOUT CONTRAST TECHNIQUE: Multidetector CT imaging of the lumbar spine was performed without intravenous contrast administration. Multiplanar CT image reconstructions were also generated. COMPARISON:  CT abdomen and pelvis 04/08/2020. FINDINGS: Segmentation: Standard. Alignment: 1.3 cm anterolisthesis due to severe facet degenerative disease is unchanged. Vertebrae: Remote superior endplate compression fracture of L1 is unchanged. The patient has a mild inferior endplate compression fracture of L2 with vertebral body height loss anteriorly of approximately 15%. The fracture is new since the prior CT and fracture lines are visible. No involvement of the posterior elements. No other fracture. No worrisome lesion. Paraspinal and other soft tissues: See report of dedicated chest, abdomen and pelvis CT today. Disc levels: T12-L1: Mild retropulsion off the superior endplate of L1 without stenosis. L1-2: Negative. L2-3: Facet degenerative disease and a shallow disc bulge. The central canal appears open. L3-4: Mild-to-moderate facet degenerative disease. Minimal disc bulge. No stenosis. L4-5: Advanced bilateral facet arthropathy. Loss of disc space height and vacuum disc phenomenon. Mild endplate spurring. The central canal and foramina appear open. L5-S1: The disc is uncovered without bulging. The central canal is open. Marked bilateral foraminal narrowing is unchanged. IMPRESSION: Acute appearing mild inferior endplate compression fracture of L2 with vertebral body height loss of up to 15%. Remote superior endplate compression fracture of L1. 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 There is marked bilateral foraminal the narrowing at this level which is unchanged. Electronically Signed   By:  Drusilla Kanner M.D.   On: 07/07/2020 11:59   DG Chest Port 1 View  Result Date:  07/04/2020 CLINICAL DATA:  Status post intubation and chest tube placement. EXAM: PORTABLE CHEST 1 VIEW COMPARISON:  Single-view of the chest earlier today. FINDINGS: Endotracheal tube is in good position with the tip at the level of clavicular heads. A new NG tube courses into the stomach and below the inferior margin of the film. Pigtail catheter is again seen in the right chest but the loop is now more peripheral. The patient's right pneumothorax appears unchanged. No midline shift is identified. The lungs are severely emphysematous. Small right effusion and right worse than left basilar airspace disease are unchanged. IMPRESSION: The patient's right chest tube has been repositioned with the loop now more peripheral in the chest. Large right pneumothorax is unchanged. ETT in good position. New NG tube courses into the stomach and below the inferior margin the film. Severe emphysema and right worse than left basilar airspace disease. Electronically Signed   By: Drusilla Kanner M.D.   On: 06/27/2020 13:52   DG Chest Portable 1 View  Result Date: 06/23/2020 CLINICAL DATA:  Lungs follow-up chest tube insertion. EXAM: PORTABLE CHEST 1 VIEW COMPARISON:  07/01/2020 FINDINGS: ET tube tip is above the carina. Interval placement of right-sided chest tube. The moderate lateral and large basilar right pneumothorax are decreased in size when compared with the previous exam. Extensive bilateral interstitial and airspace densities are again noted. Multiple bilateral rib deformities appear chronic. IMPRESSION: 1. Decrease in size right lateral and large basilar pneumothorax status post chest tube placement. 2. Extensive bilateral interstitial and airspace densities. Electronically Signed   By: Signa Kell M.D.   On: 07/17/2020 10:43    ROS: Unobtainable Blood pressure (!) 132/58, pulse 87, temperature 97.6 F (36.4 C), resp. rate (!) 24, height  (1.651 m), SpO2 91 %. Estimated body mass index is 16.11 kg/m as  calculated from the following:   Height as of this encounter:  (1.651 m).   Weight as of 04/10/20: 43.9 kg.  Physical Exam  General: An intubated frail thin 80 year old white female with diffuse ecchymosis in a cervical collar  HEENT: The patient's eyes are continuously open, her pupils are equal.  She has an abrasion to her right forehead.  Neck: Unremarkable in a cervical collar  Abdomen: Soft  Thorax: She has a right pigtail catheter in [facility] today  Extremities: Her lower extremities are erythematous and edematous with some skin sloughing and abrasions.  Neurologic exam: Glasgow Coma Scale 10, E4, M5, V1 intubated.  Her pupils are equal.  She does not blink to confrontation.  Corneal reflexes are present.  She localizes the pain on the left.  She is right hemiparetic.  Imaging studies: I have reviewed the patient's head CT performed at [facility] today.  She has an acute moderate left subdural hematoma with mild to moderate midline shift.  I reviewed her cervical CT performed today.  It demonstrates mild degenerative changes, no acute findings.  I have also reviewed her thoracic and lumbar CT performed today.  The thoracic CT is unremarkable.  The lumbar CT demonstrates a Schmorl's node at T12-L1 and at L2-3.  She has diffuse degenerative changes.  She has a spondylolisthesis at L5-S1.     Assessment/Plan: Right subdural hematoma: The patient has multiple medical problems including COPD, oxygen dependence, on aspirin/Plavix, decubitus ulcers, etc.  By report, she is deaf and mute at a nursing  home.  Under circumstances I do not think she is an appropriate surgical candidate for a craniotomy.  I would recommend that she be made a DNR and comfort care only.  I have left a message with her guardian to this effect.  Cristi Loron Jul 26, 2020, 2:42 PM

## 2020-07-11 NOTE — ED Notes (Signed)
This RN spoke to [employer], Charity fundraiser at [employer] (nurse for Dr. Carman Ching) and provided short report for Dr. Katrinka Blazing who is currently placing a R sided chest tube. Ariel RN at bedside to assist.

## 2020-07-11 NOTE — Procedures (Signed)
Chest tube insertion  Date/Time: 07/15/2020 4:38 PM Performed by: Rodman Pickle, MD Authorized by: Rodman Pickle, MD   Consent:    Consent obtained:  Emergent situation Pre-procedure details:    Skin preparation:  ChloraPrep Sedation:    Sedation type:  Deep Anesthesia (see MAR for exact dosages):    Anesthesia method:  Local infiltration   Local anesthetic:  Lidocaine 1% w/o epi Procedure details:    Placement location:  R lateral   Scalpel size:  11   Tube size (Fr):  28   Technique: blunt     Dissection instrument:  Kelly clamp   Ultrasound guidance: no     Tension pneumothorax: no     Tube connected to:  Suction   Drainage characteristics:  Air only   Suture material:  2-0 silk and 0 silk   Dressing:  4x4 sterile gauze Post-procedure details:    Post-insertion x-ray findings: tube in good position     Patient tolerance of procedure:  Tolerated well, no immediate complications

## 2020-07-11 NOTE — ED Triage Notes (Signed)
Pt arrives to ER via [facility] from [facility] [address] Staff reported they saw pt at 7. Next time they checked pt was on floor. Lac to R side of head. Pt covered in blood. VSS with EMS. Pt wears 4 L [place] chronically, was 80's on 4L. Placed on nonrebreather upon arrival. Pt appears fluid overloaded. Baseline is alert but confused. Not able to answer questions

## 2020-07-11 NOTE — ED Notes (Signed)
Accepted ED to ED, [facility] to transport [id number]

## 2020-07-11 NOTE — ED Notes (Signed)
Pt taken to CT   This RN unable to obtain 2nd set of cultures. Attempted x3.

## 2020-07-12 ENCOUNTER — Inpatient Hospital Stay (HOSPITAL_COMMUNITY): Payer: Medicare Other

## 2020-07-12 DIAGNOSIS — S2241XA Multiple fractures of ribs, right side, initial encounter for closed fracture: Secondary | ICD-10-CM | POA: Diagnosis not present

## 2020-07-12 DIAGNOSIS — S065X9A Traumatic subdural hemorrhage with loss of consciousness of unspecified duration, initial encounter: Secondary | ICD-10-CM

## 2020-07-12 DIAGNOSIS — Z515 Encounter for palliative care: Secondary | ICD-10-CM

## 2020-07-12 DIAGNOSIS — H9193 Unspecified hearing loss, bilateral: Secondary | ICD-10-CM

## 2020-07-12 DIAGNOSIS — R296 Repeated falls: Secondary | ICD-10-CM | POA: Diagnosis not present

## 2020-07-12 DIAGNOSIS — S270XXA Traumatic pneumothorax, initial encounter: Secondary | ICD-10-CM

## 2020-07-12 DIAGNOSIS — Z7189 Other specified counseling: Secondary | ICD-10-CM

## 2020-07-12 LAB — BASIC METABOLIC PANEL
Anion gap: 11 (ref 5–15)
Anion gap: 8 (ref 5–15)
BUN: 10 mg/dL (ref 8–23)
BUN: 10 mg/dL (ref 8–23)
CO2: 22 mmol/L (ref 22–32)
CO2: 22 mmol/L (ref 22–32)
Calcium: 7.9 mg/dL — ABNORMAL LOW (ref 8.9–10.3)
Calcium: 7.6 mg/dL — ABNORMAL LOW (ref 8.9–10.3)
Chloride: 104 mmol/L (ref 98–111)
Chloride: 108 mmol/L (ref 98–111)
Creatinine, Ser: 0.68 mg/dL (ref 0.44–1.00)
Creatinine, Ser: 0.56 mg/dL (ref 0.44–1.00)
GFR, Estimated: >60 mL/min (ref >60)
GFR, Estimated: >60 mL/min (ref >60)
Glucose, Bld: 106 mg/dL — ABNORMAL HIGH (ref 70–99)
Glucose, Bld: 111 mg/dL — ABNORMAL HIGH (ref 70–99)
Potassium: 4.1 mmol/L (ref 3.5–5.1)
Potassium: 3.3 mmol/L — ABNORMAL LOW (ref 3.5–5.1)
Sodium: 137 mmol/L (ref 135–145)
Sodium: 138 mmol/L (ref 135–145)

## 2020-07-12 LAB — GLUCOSE, CAPILLARY
Glucose-Capillary: 106 mg/dL — ABNORMAL HIGH (ref 70–99)
Glucose-Capillary: 98 mg/dL (ref 70–99)
Glucose-Capillary: 95 mg/dL (ref 70–99)
Glucose-Capillary: 105 mg/dL — ABNORMAL HIGH (ref 70–99)
Glucose-Capillary: 118 mg/dL — ABNORMAL HIGH (ref 70–99)
Glucose-Capillary: 115 mg/dL — ABNORMAL HIGH (ref 70–99)

## 2020-07-12 LAB — CBC
HCT: 22 % — ABNORMAL LOW (ref 36.0–46.0)
Hemoglobin: 6.7 g/dL — CL (ref 12.0–15.0)
MCH: 29.1 pg (ref 26.0–34.0)
MCHC: 30.5 g/dL (ref 30.0–36.0)
MCV: 95.7 fL (ref 80.0–100.0)
Platelets: 326 10*3/uL (ref 150–400)
RBC: 2.3 MIL/uL — ABNORMAL LOW (ref 3.87–5.11)
RDW: 16.7 % — ABNORMAL HIGH (ref 11.5–15.5)
WBC: 16.2 10*3/uL — ABNORMAL HIGH (ref 4.0–10.5)
nRBC: 0 % (ref 0.0–0.2)

## 2020-07-12 LAB — URINE CULTURE: Culture: NO GROWTH

## 2020-07-12 LAB — MRSA PCR SCREENING: MRSA by PCR: POSITIVE — AB

## 2020-07-12 LAB — TRIGLYCERIDES: Triglycerides: 411 mg/dL — ABNORMAL HIGH (ref <150)

## 2020-07-12 LAB — PREPARE RBC (CROSSMATCH)

## 2020-07-12 LAB — HEMOGLOBIN A1C
Hgb A1c MFr Bld: 4.9 % (ref 4.8–5.6)
Mean Plasma Glucose: 93.93 mg/dL

## 2020-07-12 MED ORDER — GLYCOPYRROLATE 0.2 MG/ML IJ SOLN
0.4000 mg | INTRAMUSCULAR | Status: DC
  Administered 2020-07-12 (×2): 0.4 mg via INTRAVENOUS
  Filled 2020-07-12 (×2): qty 2

## 2020-07-12 MED ORDER — SODIUM CHLORIDE 0.9% IV SOLUTION: Freq: Once | INTRAVENOUS | Status: DC

## 2020-07-12 MED ORDER — ACETAMINOPHEN 325 MG PO TABS: 650.0000 mg | ORAL_TABLET | Freq: Four times a day (QID) | ORAL | Status: DC | PRN

## 2020-07-12 MED ORDER — ONDANSETRON 4 MG PO TBDP: 4.0000 mg | ORAL_TABLET | Freq: Four times a day (QID) | ORAL | Status: DC | PRN

## 2020-07-12 MED ORDER — POLYVINYL ALCOHOL 1.4 % OP SOLN
1.0000 [drp] | Freq: Four times a day (QID) | OPHTHALMIC | Status: DC | PRN
  Filled 2020-07-12: qty 15

## 2020-07-12 MED ORDER — BISACODYL 10 MG RE SUPP: 10.0000 mg | Freq: Every day | RECTAL | Status: DC | PRN

## 2020-07-12 MED ORDER — PROSOURCE TF PO LIQD
45.0000 mL | Freq: Two times a day (BID) | ORAL | Status: DC
  Administered 2020-07-12: 45 mL
  Filled 2020-07-12: qty 45

## 2020-07-12 MED ORDER — PANTOPRAZOLE SODIUM 40 MG PO PACK
40.0000 mg | PACK | Freq: Every day | ORAL | Status: DC
  Administered 2020-07-12: 40 mg
  Filled 2020-07-12: qty 20

## 2020-07-12 MED ORDER — ONDANSETRON HCL 4 MG/2ML IJ SOLN: 4.0000 mg | Freq: Four times a day (QID) | INTRAMUSCULAR | Status: DC | PRN

## 2020-07-12 MED ORDER — LEVETIRACETAM IN NACL 500 MG/100ML IV SOLN
500.0000 mg | Freq: Two times a day (BID) | INTRAVENOUS | Status: DC
  Administered 2020-07-12 (×2): 500 mg via INTRAVENOUS
  Filled 2020-07-12 (×2): qty 100

## 2020-07-12 MED ORDER — BIOTENE DRY MOUTH MT LIQD: 15.0000 mL | OROMUCOSAL | Status: DC | PRN

## 2020-07-12 MED ORDER — ACETAMINOPHEN 650 MG RE SUPP: 650.0000 mg | Freq: Four times a day (QID) | RECTAL | Status: DC | PRN

## 2020-07-12 MED ORDER — ACETAMINOPHEN 500 MG PO TABS: 1000.0000 mg | ORAL_TABLET | Freq: Four times a day (QID) | ORAL | Status: DC

## 2020-07-12 MED ORDER — OSMOLITE 1.2 CAL PO LIQD
1000.0000 mL | ORAL | Status: DC
  Administered 2020-07-12: 1000 mL

## 2020-07-12 MED ORDER — ACETAMINOPHEN 500 MG PO TABS
1000.0000 mg | ORAL_TABLET | Freq: Four times a day (QID) | ORAL | Status: DC
  Administered 2020-07-12: 1000 mg

## 2020-07-12 MED ORDER — ACETAMINOPHEN 500 MG PO TABS
1000.0000 mg | ORAL_TABLET | Freq: Four times a day (QID) | ORAL | Status: DC
  Filled 2020-07-12: qty 2

## 2020-07-12 MED ORDER — MUPIROCIN 2 % EX OINT
1.0000 "application " | TOPICAL_OINTMENT | Freq: Two times a day (BID) | CUTANEOUS | Status: DC
  Administered 2020-07-12 (×2): 1 via NASAL
  Filled 2020-07-12: qty 22

## 2020-07-12 MED ORDER — METHOCARBAMOL 1000 MG/10ML IJ SOLN
1000.0000 mg | Freq: Three times a day (TID) | INTRAVENOUS | Status: DC
  Administered 2020-07-12 (×2): 1000 mg via INTRAVENOUS
  Filled 2020-07-12 (×3): qty 10

## 2020-07-12 MED ORDER — SODIUM CHLORIDE 0.9 % IV BOLUS
1000.0000 mL | Freq: Once | INTRAVENOUS | Status: AC
  Administered 2020-07-12: 1000 mL via INTRAVENOUS

## 2020-07-12 MED ORDER — FENTANYL 2500MCG IN NS 250ML (10MCG/ML) PREMIX INFUSION
0.0000 ug/h | INTRAVENOUS | Status: DC
  Administered 2020-07-12: 25 ug/h via INTRAVENOUS
  Filled 2020-07-12: qty 250

## 2020-07-12 MED ORDER — FENTANYL BOLUS VIA INFUSION
25.0000 ug | INTRAVENOUS | Status: DC | PRN
  Filled 2020-07-12: qty 25

## 2020-07-12 MED ORDER — VITAL HIGH PROTEIN PO LIQD
1000.0000 mL | ORAL | Status: DC
  Administered 2020-07-12: 45 mL
  Administered 2020-07-12: 1000 mL

## 2020-07-12 MED ORDER — SODIUM CHLORIDE 0.9% IV SOLUTION: Freq: Once | INTRAVENOUS | Status: AC

## 2020-07-12 MED ORDER — OXYCODONE HCL 5 MG/5ML PO SOLN: 2.5000 mg | ORAL | Status: DC | PRN

## 2020-07-12 MED ORDER — CHLORHEXIDINE GLUCONATE CLOTH 2 % EX PADS
6.0000 | MEDICATED_PAD | Freq: Every day | CUTANEOUS | Status: DC
  Administered 2020-07-11: 6 via TOPICAL

## 2020-07-13 LAB — TYPE AND SCREEN
ABO/RH(D): A POS
Antibody Screen: NEGATIVE
Unit division: 0

## 2020-07-13 LAB — BPAM RBC
Blood Product Expiration Date: 202203122359
ISSUE DATE / TIME: 202202211028
Unit Type and Rh: 6200

## 2020-07-13 NOTE — Progress Notes (Signed)
Spoke with [employer] (Legal Guardian) and obtained funeral home information. Also spoke with Niece Weyman Pedro who said she would call Yolonda Kida.

## 2020-07-13 NOTE — Progress Notes (Signed)
Wasted 55 ml of fentanyl drip with Ciara Linnens RN.

## 2020-07-16 LAB — CULTURE, BLOOD (ROUTINE X 2)
Culture: NO GROWTH
Culture: NO GROWTH
Special Requests: ADEQUATE
Special Requests: ADEQUATE

## 2020-07-19 MED FILL — Fentanyl Citrate Preservative Free (PF) Inj 100 MCG/2ML: INTRAMUSCULAR | Qty: 0.5 | Status: AC

## 2020-07-20 DIAGNOSIS — 419620001 Death: Secondary | SNOMED CT | POA: Insufficient documentation

## 2020-07-20 NOTE — Progress Notes (Signed)
Time of death 2330 declared with Lily Peer RN and Jobie Quaker RN.

## 2020-07-20 NOTE — Progress Notes (Signed)
Nutrition Follow-up  DOCUMENTATION CODES:   Underweight  INTERVENTION:   Plan for Cortrak as able  Tube Feeding via OG:  Osmolite 1.2 at 50 ml/hr Begin at 20 ml/hr, titrate by 10 mL q 8 hours until goal rate of 50 ml/hr Pro-Source TF 45 mL BID Provides 1480 kcals, 89 g of protein and 972 mL of free water Meets 100% estimated calorie and protein needs  Monitor magnesium, potassium, and phosphorus for at least 3 days, MD to replete as needed, as pt is at risk for refeeding syndrome given hx of severe malnutrition, underweight status   TF regimen and propofol at current rate providing 1664 total kcal/day    NUTRITION DIAGNOSIS:   Inadequate oral intake related to acute illness as evidenced by NPO status.  GOAL:   Patient will meet greater than or equal to 90% of their needs  MONITOR:   Vent status,Labs,Weight trends,TF tolerance  REASON FOR ASSESSMENT:   Consult,Ventilator Enteral/tube feeding initiation and management  ASSESSMENT:   80 yo female admitted from nursing home post unwitnessed fall with with L SDH, rib fractures with R PTX. PMH includes COPD, HTN, osteoporosis, hip fracture s/p IM nail 01/2020. Noted pt is deaf.  Patient is currently intubated on ventilator support MV: 9.2 L/min Temp (24hrs), Avg:98.7 F (37.1 C), Min:98 F (36.7 C), Max:99.9 F (37.7 C)  Propofol: 7 ml/hr  Noted pt is underweight based on BMI; pt with hx of chronic severe malnutrition  Per RN, pt with multiple ares of skin breakdown  OG tube in stomach per chest xray, side port at GE function with advancement of 9-10 cm recommended. Cortrak pending but service extremely busy today and unsure if will be placed today. Discussed advancement with RN   Labs: reviewed Meds: ss novolog, colace, miralax  Diet Order:   Diet Order            Diet NPO time specified  Diet effective now                 EDUCATION NEEDS:   Not appropriate for education at this time  Skin:  Skin  Assessment: Skin Integrity Issues: Skin Integrity Issues:: Stage II,Stage III Stage II: sacrum Stage III: buttocks  Last BM:  PTA  Height:   Ht Readings from Last 1 Encounters:  07-23-2020 5\' 5"  (1.651 m)    Weight:   Wt Readings from Last 1 Encounters:  July 23, 2020 43.5 kg   BMI:  Body mass index is 15.98 kg/m.  Estimated Nutritional Needs:   Kcal:  1460-1650 kcals  Protein:  75-85 g  Fluid:  >/= 1.5 L   07/13/20 MS, RDN, LDN, CNSC Registered Dietitian III Clinical Nutrition RD Pager and On-Call Pager Number Located in [place]

## 2020-07-20 NOTE — Progress Notes (Addendum)
Alice Wallace gave consent for pt to have blood transfusion. She also stated that communication and decision making will be via either Rodena Medin, Program Manager [place] [facility] at [phone] or Virginia Crews, AD APS [facility] at [phone].

## 2020-07-20 NOTE — Consult Note (Addendum)
Palliative Medicine Inpatient Consult Note  Reason for consult:  Goals of Care  HPI:  Per intake H&P --> 80 yo female with a PMH of COPD, HTN, OP, deaf, & mute. She resides at [facility] nursing home. She was admitted after having had an unwitnessed fall. At [facility] she was found to have respiratory failure and right large pneumothorax with multiple rib fractures and left SDH.  Palliative care was consulted in the setting of patients poor prognosis to aid in supporting additional medical conversations/decisions with patients legal guardian(s).  Clinical Assessment/Goals of Care:  *Please note that this is a verbal dictation therefore any spelling or grammatical errors are due to the "Dragon Medical One" system interpretation.  I have reviewed medical records including EPIC notes, labs and imaging, received report from bedside RN, assessed the patient who was intubated, able to withdraw appropriately top stimulation.    I called Jewel Monroe and Rodena Medin to further discuss diagnosis prognosis, GOC, EOL wishes, disposition and options.   I introduced Palliative Medicine as specialized medical care for people living with serious illness. It focuses on providing relief from the symptoms and stress of a serious illness. The goal is to improve quality of life for both the patient and the family.  Vaishali lives in [place] [facility] [place]. She has been at ALF for many years now in the setting of her deafness. She had a sister who she at one point lived at [facility] with though she passed away about three years ago. This is how Kamy came under legal guardianship. Despite being deaf and mute she is able to communicate per Jewel through gestures though this is not ASL, it is generalized hand motions and facial expressions. She does make some vocal sounds, most notably "Eee".   Prior to hospitalization Terri was able to mobilize with a rollator. She depended on [facility] workers for a majority of her bADLs  otherwise.   Jewel shares that Namira has been in and out of the hospital recurrently over the past few months. We reviewed her recurrent hospitalizations which have been four inpatient stays and one ED admission in the past six months.   A detailed discussion was had today regarding advanced directives - there are none on file though per our conversations Jewel is patients legal guardian for medical decisions. Decisions are escalated to her superiors Rodena Medin and Virginia Crews when circulating around advance planning/decisions.    Concepts specific to code status, artifical feeding and hydration, continued IV antibiotics and rehospitalization was had.  We reviewed the MOST form which has been completed on 02/03/20 as below:  Cardiopulmonary Resuscitation: Do Not Attempt Resuscitation (DNR/No CPR)  Medical Interventions: Full Scope of Treatment: Use intubation, advanced airway interventions, mechanical ventilation, cardioversion as indicated, medical treatment, IV fluids, etc, also provide comfort measures. Transfer to the hospital if indicated  Antibiotics: Antibiotics if indicated  IV Fluids: IV fluids if indicated  Feeding Tube: Feeding tube for a defined trial period   The difference between a aggressive medical intervention path  and a palliative comfort care path for this patient at this time was had. Values and goals of care important to patient and family were attempted to be elicited. We reviewed that Madelene has endured multiple rib fractures and a pneumothorax requiring two chest tubes. We reviewed that she is now on ventilatory support. We reviewed that she has a large L SDH which surgery will not be pursued for. We talked about patients quality of life after  enduring all of this.   We reviewed that a decision will need to me made in terms of whether to continue aggressive care or to allow Taniya to be made comfortable. Patients guardian has asked for some time to speak to her family prior  to deciding. Both Jewel and Molly Maduro recognize the importance of this decision and are leaning towards making Janett comfortable at this point.  We agreed to touch base after family has been informed for additional decisions.  Discussed the importance of continued conversation with family and their  medical providers regarding overall plan of care and treatment options, ensuring decisions are within the context of the patients values and GOCs.  Decision Maker: Annell Greening [phone] Rodena Medin, Program Manager [place] [facility] at [phone]  Virginia Crews, AD APS [facility] at [phone]  SUMMARY OF RECOMMENDATIONS   DNAR - MOST on file for Full Scope of Tx, Abx, IVF, and trial of TF  Legal Guardian - Jewel Monroe and her superior Rodena Medin have asked for some time to update patients living relatives on her present health state prior to making additional decisions regarding liberation from ventilatory support  Ongoing Palliative Care conversations   Code Status/Advance Care Planning: DNAR  Palliative Prophylaxis:   Oral Care, Turn Q2H, Aspiration Precautions, Delirium Precautions, Pain management  Additional Recommendations (Limitations, Scope, Preferences):  Continue current scope of care  Psycho-social/Spiritual:   Desire for further Chaplaincy support: Yes  Additional Recommendations: Education on acuity of illness and the importance of medical decision making   Prognosis: Poor in the setting of recurrent admissions, increased age, and severely frail state.   Discharge Planning: Discharge plan is presently uncertain.   Vitals:   07/29/20 1320 07-29-2020 1400  BP:  (!) 114/47  Pulse: (!) 102 97  Resp: (!) 23 20  Temp:    SpO2: 92% 92%    Intake/Output Summary (Last 24 hours) at 2020-07-29 1431 Last data filed at 07-29-20 1400 Gross per 24 hour  Intake 1152.56 ml  Output 1105 ml  Net 47.56 ml   Last Weight  Most recent update: 06/27/2020  3:10 PM    Weight  43.5 kg (96 lb)           Gen:  Frail Elderly Caucasian F intubated HEENT: ETT, dry mucous membranes CV: Regular rate and rhythm, no murmurs rubs or gallops PULM:  (+) 2 chest tubes, (+) mechanical vent ABD: soft/nontender  EXT: (+) pedal edema, (+) ecchymosis  Neuro: Somnolent, withdraws to painful stimuli  PPS: 10%   This conversation/these recommendations were discussed with patient primary care team, Dr. Bedelia Person  Time In: 1300 Time Out: 1410 Total Time: 70 Greater than 50%  of this time was spent counseling and coordinating care related to the above assessment and plan.  Kemyra August Loleta Rose [facility] Palliative Medicine Team Team Cell Phone: [phone] Please utilize secure chat with additional questions, if there is no response within 30 minutes please call the above phone number  Palliative Medicine Team providers are available by phone from 7am to 7pm daily and can be reached through the team cell phone.  Should this patient require assistance outside of these hours, please call the patient's attending physician.  Addendum: Spoke with Jewel (legal guardian) who agrees with transition to comfort-focused care. Discussed what this entails for the patient. Jewel has informed Ms. Hatlestad's family members and states that they will be visiting at the bedside this evening. Jewel would like for the family to spend time  with Ms. Campoy before proceeding with compassionate extubation. Informed Dr. Bedelia Person, Elaine/RN, and discussed with Lamarr Lulas NP. Orders for comfort measures have been entered. Medications per MAR.    Time In: 04:30pm Time Out: 05:00pm Total additional time: 30 minutes

## 2020-07-20 NOTE — Progress Notes (Signed)
Family (nieces, nephew and their children) visiting. They request to be notified when pt passes. Weyman Pedro (Niece) [phone]. Jaquelyn Sakamoto [facility]) [phone].

## 2020-07-20 NOTE — Progress Notes (Signed)
Restraints removed, order d/c'd.

## 2020-07-20 NOTE — Progress Notes (Signed)
LM with pt's contact Jewel Monroe to call for consent to give blood product.

## 2020-07-20 NOTE — Progress Notes (Signed)
Trauma/Critical Care Follow Up Note  Subjective:    Overnight Issues:   Objective:  Vital signs for last 24 hours: Temp:  [96.7 F (35.9 C)-99.9 F (37.7 C)] 98 F (36.7 C) (02/21 0400) Pulse Rate:  [87-132] 101 (02/21 0600) Resp:  [11-32] 27 (02/21 0600) BP: (88-162)/(45-91) 130/47 (02/21 0600) SpO2:  [86 %-100 %] 95 % (02/21 0600) FiO2 (%):  [30 %-50 %] 30 % (02/21 0401) Weight:  [43.5 kg] 43.5 kg (02/20 1500)  Hemodynamic parameters for last 24 hours:    Intake/Output from previous day: 02/20 0701 - 02/21 0700 In: 524.3 [I.V.:524.3] Out: 810 [Urine:310; Chest Tube:500]  Intake/Output this shift: Total I/O In: 524.3 [I.V.:524.3] Out: 810 [Urine:310; Chest Tube:500]  Vent settings for last 24 hours: Vent Mode: PRVC FiO2 (%):  [30 %-50 %] 30 % Set Rate:  [10 bmp-24 bmp] 10 bmp Vt Set:  [360 mL-400 mL] 360 mL PEEP:  [5 cmH20] 5 cmH20 Plateau Pressure:  [10 cmH20-14 cmH20] 10 cmH20  Physical Exam:  Gen: comfortable, no distress Neuro: withdraws to pain, squeezes eyes so cannot examine pupils Neck: supple, c-collar CV: RRR Pulm: unlabored breathing on PSV Abd: soft, NT GU: anuric Extr: wwp, no edema   Results for orders placed or performed during the hospital encounter of 2020-08-02 (from the past 24 hour(s))  I-Stat venous blood gas, [facility] ED only)     Status: Abnormal   Collection Time: 08/02/20  1:30 PM  Result Value Ref Range   pH, Ven 7.823 (HH) 7.250 - 7.430   pCO2, Ven <15.0 (LL) 44.0 - 60.0 mmHg   pO2, Ven 126.0 (H) 32.0 - 45.0 mmHg   Bicarbonate 22.7 20.0 - 28.0 mmol/L   TCO2 23 22 - 32 mmol/L   O2 Saturation 100.0 %   Acid-Base Excess 1.0 0.0 - 2.0 mmol/L   Sodium 134 (L) 135 - 145 mmol/L   Potassium 3.8 3.5 - 5.1 mmol/L   Calcium, Ion 0.85 (LL) 1.15 - 1.40 mmol/L   HCT 22.0 (L) 36.0 - 46.0 %   Hemoglobin 7.5 (L) 12.0 - 15.0 g/dL   Patient temperature 81.8 C    Sample type VENOUS    Comment NOTIFIED PHYSICIAN   Glucose, capillary     Status:  Abnormal   Collection Time: August 02, 2020  7:35 PM  Result Value Ref Range   Glucose-Capillary 117 (H) 70 - 99 mg/dL  MRSA PCR Screening     Status: Abnormal   Collection Time: 2020-08-02 11:00 PM   Specimen: Nasal Mucosa; Nasopharyngeal  Result Value Ref Range   MRSA by PCR POSITIVE (A) NEGATIVE  Glucose, capillary     Status: None   Collection Time: August 02, 2020 11:47 PM  Result Value Ref Range   Glucose-Capillary 99 70 - 99 mg/dL  Glucose, capillary     Status: Abnormal   Collection Time: 07/07/2020  3:27 AM  Result Value Ref Range   Glucose-Capillary 106 (H) 70 - 99 mg/dL  Basic metabolic panel     Status: Abnormal   Collection Time: 06/28/2020  3:58 AM  Result Value Ref Range   Sodium 137 135 - 145 mmol/L   Potassium 4.1 3.5 - 5.1 mmol/L   Chloride 104 98 - 111 mmol/L   CO2 22 22 - 32 mmol/L   Glucose, Bld 106 (H) 70 - 99 mg/dL   BUN 10 8 - 23 mg/dL   Creatinine, Ser 2.99 0.44 - 1.00 mg/dL   Calcium 7.9 (L) 8.9 - 10.3 mg/dL  GFR, Estimated >60 >60 mL/min   Anion gap 11 5 - 15  Triglycerides     Status: Abnormal   Collection Time: 2020/08/06  3:58 AM  Result Value Ref Range   Triglycerides 411 (H) <150 mg/dL    Assessment & Plan: The plan of care was discussed with the bedside nurse for overnight, who is in agreement with this plan and no additional concerns were raised.   Present on Admission: . SDH (subdural hematoma) (HCC)    LOS: 1 day   Additional comments:I reviewed the patient's new clinical lab test results.   and I reviewed the patients new imaging test results.    25F GLF  Rib fractures and R PTX - CT x2, pigtail with cont 1-column AL, 500cc SS o/p, no PTX on CXR, cont both CT to sxn L SDH with 22mm MLS - NSGY c/s, Dr. Lovell Sheehan, keppra x7d for sz ppx, repeat CT head this AM, palliative c/s as no intervention offered, remove c-collar Anuria - rec'ing 1L NS bolus, give a second, creat nl, recheck this PM VDRF - minimal settings, PSV as tol, start fent gtt, wean prop as  3gly >400, add adjuncts for pain FEN - NPO, place cortrack PP, start TF, add PPI DVT - SCDs, hold LMWH for now Dispo - ICU, family contact and palliative c/s   Critical Care Total Time: 60 minutes  Diamantina Monks, MD Trauma & General Surgery Please use [email or web address] to contact on call provider  06-Aug-2020  *Care during the described time interval was provided by me. I have reviewed this patient's available data, including medical history, events of note, physical examination and test results as part of my evaluation.

## 2020-07-20 NOTE — Progress Notes (Signed)
Pt with multiple areas of skin breakdown. Rt sacrum has stage 2 wound, mid sacrum also stage one (small area open kin) Left buttock with stage one (redness). Small eschar to rt heel. Small skin tear to Left lower arm. Large bruise/hematoma present to rt upper outer thigh. All skin cleansed, barrier cream applied, foams on open areas, heels floated. Cont to monitor skin integrity.

## 2020-07-20 NOTE — Progress Notes (Signed)
Date and time results received: 07/16/2020 0645  Test: Hgb Critical Value: 6.7  Name of Provider Notified: Dr Bedelia Person  Orders Received? Or Actions Taken?: Orders to transfuse 1 unit

## 2020-07-20 NOTE — Progress Notes (Signed)
RT extubated patient per order at 2050. Ventilator has been removed from room.

## 2020-07-20 NOTE — Progress Notes (Signed)
Ordered by Dr Cliffton Asters to not delay blood admin on speaking with guardian. Order placed in epic.

## 2020-07-20 NOTE — Progress Notes (Signed)
Family has left. They are ready for extubation. They want to be notified of her passing. RT notified.

## 2020-07-20 NOTE — Progress Notes (Signed)
Received callback from legal guardian [facility]. She states she will call me back when she gets to the office but at this time does not consent for blood transfusion.

## 2020-07-20 NOTE — Progress Notes (Signed)
Subjective: I saw the patient around 730 this morning.  She is intubated and somnolent but arousable.  She is in no apparent distress.  Objective: Vital signs in last 24 hours: Temp:  [97.6 F (36.4 C)-99.9 F (37.7 C)] 98.3 F (36.8 C) (02/21 1130) Pulse Rate:  [87-108] 95 (02/21 1130) Resp:  [15-32] 19 (02/21 1130) BP: (88-162)/(45-84) 120/49 (02/21 1102) SpO2:  [91 %-100 %] 91 % (02/21 1130) FiO2 (%):  [30 %-40 %] 30 % (02/21 1102) Weight:  [43.5 kg] 43.5 kg (02/20 1500) Estimated body mass index is 15.98 kg/m as calculated from the following:   Height as of this encounter: 5\' 5"  (1.651 m).   Weight as of this encounter: 43.5 kg.   Intake/Output from previous day: 02/20 0701 - 02/21 0700 In: 524.3 [I.V.:524.3] Out: 810 [Urine:310; Chest Tube:500] Intake/Output this shift: Total I/O In: -  Out: 150 [Urine:150]  Physical exam Glasgow Coma Scale 9, intubated, [id number].  She is right hemiparetic.  She is purposeful on the left.  She does not follow commands.  I have reviewed the patient's repeat head scan performed today.  It demonstrates no significant change in her left subdural hematoma with mild midline shift.  Lab Results: Recent Labs    07-13-2020 0834 2020-07-13 1330 07/15/2020 0358  WBC 16.2*  --  16.2*  HGB 8.7* 7.5* 6.7*  HCT 27.5* 22.0* 22.0*  PLT 386  --  326   BMET Recent Labs    Jul 13, 2020 0834 July 13, 2020 1330 06/24/2020 0358  NA 134* 134* 137  K 4.0 3.8 4.1  CL 101  --  104  CO2 23  --  22  GLUCOSE 142*  --  106*  BUN 10  --  10  CREATININE 0.60  --  0.68  CALCIUM 8.2*  --  7.9*    Studies/Results: DG Chest 1 View  Result Date: 07-13-2020 CLINICAL DATA:  80 year old female with shortness of breath and fall. EXAM: CHEST  1 VIEW COMPARISON:  04/08/2020 CT and prior studies FINDINGS: A RIGHT pneumothorax is noted with moderate lateral component and large basilar component. There is tethering of the mid-lower LEFT lung to the pleura. An acute fracture of  the posterior RIGHT 4th rib is noted. Emphysema/COPD changes are noted. Multiple remote bilateral rib fractures are present. Mild LEFT basilar atelectasis/opacities noted. An endotracheal tube is present with tip 5.5 cm above the carina. IMPRESSION: 1. RIGHT pneumothorax with moderate lateral and large basilar component. 2. Acute fracture of the posterior RIGHT 4th rib. 3. LEFT basilar atelectasis/opacities. 4.  Emphysema (ICD10-J43.9). Critical Value/emergent results were called by telephone at the time of interpretation on 2020/07/13 at 9:55 am to provider [facility] , who verbally acknowledged these results. Electronically Signed   By: [facility] M.D.   On: 07-13-2020 09:58   CT HEAD WO CONTRAST  Result Date: 07/19/2020 CLINICAL DATA:  Follow-up subdural hematoma. EXAM: CT HEAD WITHOUT CONTRAST TECHNIQUE: Contiguous axial images were obtained from the base of the skull through the vertex without intravenous contrast. COMPARISON:  Jul 13, 2020 FINDINGS: Brain: A subdural hematoma over the left cerebral convexity has not significantly changed in size, measuring up to 1.1 cm in thickness. There is a similar degree of mass effect with unchanged rightward midline shift measuring 4 mm. Minimal subdural blood is again noted extending along the falx and left tentorium. Scattered small volume subarachnoid hemorrhage bilaterally as well as a small amount of intraventricular hemorrhage in the occipital horns has also not significantly changed in overall  amount. Patchy hypodensities in the cerebral white matter bilaterally are unchanged and nonspecific but compatible with moderately extensive chronic small vessel ischemic disease. No acute cortically based infarct or mass is identified. There is no hydrocephalus. Vascular: Calcified atherosclerosis at the skull base. Skull: No acute fracture or suspicious osseous lesion. Sinuses/Orbits: Minimal scattered mucosal thickening in the paranasal sinuses. Trace bilateral  mastoid fluid. Bilateral cataract extraction. Other: None. IMPRESSION: 1. Unchanged left-sided subdural hematoma and mild midline shift. 2. Unchanged small volume subarachnoid and intraventricular hemorrhage. 3. Moderately extensive chronic small vessel ischemic disease. Electronically Signed   By: Sebastian Ache M.D.   On: 07-24-20 11:22   CT Head Wo Contrast  Result Date: 07/10/2020 CLINICAL DATA:  Unwitnessed fall. EXAM: CT HEAD WITHOUT CONTRAST CT CERVICAL SPINE WITHOUT CONTRAST TECHNIQUE: Multidetector CT imaging of the head and cervical spine was performed following the standard protocol without intravenous contrast. Multiplanar CT image reconstructions of the cervical spine were also generated. COMPARISON:  None. FINDINGS: CT HEAD FINDINGS Brain: There is a large left subdural hematoma overlying the left cerebral hemisphere. This measures 1.2 cm in maximum thickness and has mass effect upon the underlying brain parenchyma. Anterior parafalcine component is noted which measures 3.4 mm in thickness. There is also extension along the left tentorium. Left to right midline shift measures 5 mm. Subarachnoid hemorrhage is identified with hyperdense blood filling the basilar cisterns as well as along the surface of the right frontal lobe and left posterior temporal lobe. There is mild diffuse low-attenuation within the subcortical and periventricular white matter compatible with chronic microvascular disease. Prominence of the sulci and ventricles compatible with brain atrophy. Vascular: No hyperdense vessel or unexpected calcification. Skull: Normal. Negative for fracture or focal lesion. Sinuses/Orbits: Mucoperiosteal thickening involving the right maxillary sinus. Mastoid air cells are clear. Other: None. CT CERVICAL SPINE FINDINGS Alignment: Normal. Skull base and vertebrae: No acute fracture. No primary bone lesion or focal pathologic process. Soft tissues and spinal canal: No prevertebral fluid or swelling.  No visible canal hematoma. Disc levels: Multilevel disc space narrowing and endplate spurring noted. Most advanced C5-6 and C6-7. Upper chest: Advanced changes of bullous emphysema. ET tube and NG tube are identified Other: None IMPRESSION: 1. Large left sub dural hematoma with mass effect upon the underlying brain parenchyma. Left to right midline shift measures 5 mm. 2. Subarachnoid hemorrhage within the basilar cisterns and along the surface of the right frontal lobe and left posterior temporal lobe. 3. No evidence for cervical spine fracture. 4. Cervical spondylosis Critical Value/emergent results were called by telephone at the time of interpretation on 07/06/2020 at 12:08 pm to provider Dr. Roxan Hockey, who verbally acknowledged these results. Electronically Signed   By: Signa Kell M.D.   On: 07/02/2020 12:07   CT Cervical Spine Wo Contrast  Result Date: 07/14/2020 CLINICAL DATA:  Unwitnessed fall. EXAM: CT HEAD WITHOUT CONTRAST CT CERVICAL SPINE WITHOUT CONTRAST TECHNIQUE: Multidetector CT imaging of the head and cervical spine was performed following the standard protocol without intravenous contrast. Multiplanar CT image reconstructions of the cervical spine were also generated. COMPARISON:  None. FINDINGS: CT HEAD FINDINGS Brain: There is a large left subdural hematoma overlying the left cerebral hemisphere. This measures 1.2 cm in maximum thickness and has mass effect upon the underlying brain parenchyma. Anterior parafalcine component is noted which measures 3.4 mm in thickness. There is also extension along the left tentorium. Left to right midline shift measures 5 mm. Subarachnoid hemorrhage is identified with hyperdense blood filling  the basilar cisterns as well as along the surface of the right frontal lobe and left posterior temporal lobe. There is mild diffuse low-attenuation within the subcortical and periventricular white matter compatible with chronic microvascular disease. Prominence of the  sulci and ventricles compatible with brain atrophy. Vascular: No hyperdense vessel or unexpected calcification. Skull: Normal. Negative for fracture or focal lesion. Sinuses/Orbits: Mucoperiosteal thickening involving the right maxillary sinus. Mastoid air cells are clear. Other: None. CT CERVICAL SPINE FINDINGS Alignment: Normal. Skull base and vertebrae: No acute fracture. No primary bone lesion or focal pathologic process. Soft tissues and spinal canal: No prevertebral fluid or swelling. No visible canal hematoma. Disc levels: Multilevel disc space narrowing and endplate spurring noted. Most advanced C5-6 and C6-7. Upper chest: Advanced changes of bullous emphysema. ET tube and NG tube are identified Other: None IMPRESSION: 1. Large left sub dural hematoma with mass effect upon the underlying brain parenchyma. Left to right midline shift measures 5 mm. 2. Subarachnoid hemorrhage within the basilar cisterns and along the surface of the right frontal lobe and left posterior temporal lobe. 3. No evidence for cervical spine fracture. 4. Cervical spondylosis Critical Value/emergent results were called by telephone at the time of interpretation on 2020/07/18 at 12:08 pm to provider Dr. Roxan Hockey, who verbally acknowledged these results. Electronically Signed   By: Signa Kell M.D.   On: Jul 18, 2020 12:07   CT CHEST ABDOMEN PELVIS W CONTRAST  Result Date: Jul 18, 2020 CLINICAL DATA:  80 year old female with chest, abdominal and pelvic pain following fall and injury today. RIGHT pneumothorax with chest tube placement. EXAM: CT CHEST, ABDOMEN, AND PELVIS WITH CONTRAST TECHNIQUE: Multidetector CT imaging of the chest, abdomen and pelvis was performed following the standard protocol during bolus administration of intravenous contrast. CONTRAST:  75mL OMNIPAQUE IOHEXOL 300 MG/ML  SOLN COMPARISON:  04/08/2020 CTs. 07-18-20 chest radiograph and prior studies FINDINGS: CT CHEST FINDINGS Cardiovascular: Heart size is normal.  Coronary artery and aortic atherosclerotic calcifications are noted without evidence of thoracic aortic aneurysm. A trace amount of pericardial fluid is unchanged. Mediastinum/Nodes: No mediastinal hematoma or mass. No enlarged lymph nodes are identified. An endotracheal tube and NG tube are noted. Lungs/Pleura: A large RIGHT pneumothorax is noted. A RIGHT thoracostomy tube is present within the MEDIAL pleural space. Mild mediastinal shift to the LEFT is noted. A small to moderate RIGHT pleural effusion/hemothorax is noted. Severe emphysema bilaterally is identified. RIGHT LOWER lobe consolidation and atelectasis are again identified. Consolidation within the posterior RIGHT UPPER lobe is now noted. Chronic interstitial opacities within the LEFT LOWER lobe again noted. Biapical scarring is unchanged. Musculoskeletal: RIGHT subcutaneous emphysema is present. Acute fractures of the posterior RIGHT 3rd, 4th and 5th ribs, anterior RIGHT 1st through 5th ribs and lateral RIGHT 6 and 7th ribs noted. An acute nondisplaced fracture of the RIGHT coracoid process noted. There are multiple bilateral remote rib fractures present. CT ABDOMEN PELVIS FINDINGS Hepatobiliary: Patient is status post cholecystectomy. Mild intrahepatic and CBD prominence is unchanged. No other hepatic abnormalities are noted. Pancreas: No significant change. There is a 1.6 x 2.9 cm oval soft tissue structure in the region of the pancreatic head/duodenum of uncertain clinical significance but may represent a mass. Spleen: Unchanged with small splenic granulomas. No acute abnormality noted. Adrenals/Urinary Tract: The kidneys and adrenal glands are unremarkable except for mild bilateral renal cortical atrophy. The bladder is distended and contains a Foley catheter. Stomach/Bowel: An NG tube tip is noted within the proximal stomach. No bowel obstruction, definite bowel wall  thickening or inflammatory changes. Vascular/Lymphatic: Aortic atherosclerosis. No  enlarged abdominal or pelvic lymph nodes. Reproductive: No significant abnormality Other: No ascites or pneumoperitoneum. Musculoskeletal: No acute bony abnormality identified within the abdomen or pelvis. Degenerative changes within the lumbar spine, remote L1 compression fracture, grade 2 anterolisthesis of L5 on S1 and ORIF remote RIGHT femur fracture again noted. IMPRESSION: 1. Large RIGHT pneumothorax with RIGHT thoracostomy tube in place. Mild mediastinal shift to the LEFT. Associated small to moderate RIGHT pleural fluid/hemothorax. Dr. Katrinka Blazing notified of this on 07/10/2020 at 12:20 p.m. 2. Acute fractures of the posterior RIGHT 3rd, 4th and 5th ribs, anterior RIGHT 1st through 5th ribs and lateral RIGHT 6 and 7th ribs. 3. Acute nondisplaced fracture of the RIGHT coracoid process. 4. RIGHT LOWER lobe consolidation and atelectasis with new consolidation within the posterior RIGHT UPPER lobe which may represent contusion, pneumonia or aspiration. 5. Indeterminate 1.6 x 2.9 cm oval soft tissue structure in the region of the pancreatic head/duodenum of uncertain clinical significance. A mass is not excluded given appearance. Consider further evaluation with elective MRI with and without contrast as clinically indicated. 6. Distended bladder containing a Foley catheter. 7. Aortic Atherosclerosis (ICD10-I70.0) and Emphysema (ICD10-J43.9). Electronically Signed   By: Harmon Pier M.D.   On: 07/03/2020 12:24   CT T-SPINE NO CHARGE  Result Date: 06/26/2020 CLINICAL DATA:  Thoracic spine pain after a fall. Initial encounter. EXAM: CT THORACIC SPINE WITHOUT CONTRAST TECHNIQUE: Multidetector CT images of the thoracic were obtained using the standard protocol without intravenous contrast. COMPARISON:  CT chest 04/08/2020. FINDINGS: Alignment: Maintained. Vertebrae: No acute fracture or focal pathologic process. Remote superior endplate compression fracture of L1 is noted. Paraspinal and other soft tissues: See report of  dedicated chest, abdomen and pelvis CT scan today. Multiple remote bilateral rib fractures noted. Disc levels: Intervertebral disc space height is maintained. IMPRESSION: No acute abnormality. Remote L1 superior endplate compression fracture. Electronically Signed   By: Drusilla Kanner M.D.   On: 07/16/2020 12:05   CT L-SPINE NO CHARGE  Result Date: 06/25/2020 CLINICAL DATA:  Patient status post fall. Low back pain. Initial encounter. EXAM: CT LUMBAR SPINE WITHOUT CONTRAST TECHNIQUE: Multidetector CT imaging of the lumbar spine was performed without intravenous contrast administration. Multiplanar CT image reconstructions were also generated. COMPARISON:  CT abdomen and pelvis 04/08/2020. FINDINGS: Segmentation: Standard. Alignment: 1.3 cm anterolisthesis due to severe facet degenerative disease is unchanged. Vertebrae: Remote superior endplate compression fracture of L1 is unchanged. The patient has a mild inferior endplate compression fracture of L2 with vertebral body height loss anteriorly of approximately 15%. The fracture is new since the prior CT and fracture lines are visible. No involvement of the posterior elements. No other fracture. No worrisome lesion. Paraspinal and other soft tissues: See report of dedicated chest, abdomen and pelvis CT today. Disc levels: T12-L1: Mild retropulsion off the superior endplate of L1 without stenosis. L1-2: Negative. L2-3: Facet degenerative disease and a shallow disc bulge. The central canal appears open. L3-4: Mild-to-moderate facet degenerative disease. Minimal disc bulge. No stenosis. L4-5: Advanced bilateral facet arthropathy. Loss of disc space height and vacuum disc phenomenon. Mild endplate spurring. The central canal and foramina appear open. L5-S1: The disc is uncovered without bulging. The central canal is open. Marked bilateral foraminal narrowing is unchanged. IMPRESSION: Acute appearing mild inferior endplate compression fracture of L2 with vertebral  body height loss of up to 15%. Remote superior endplate compression fracture of L1. Advanced facet arthropathy at L5-S1 results in 1.3  cm anterolisthesis. There is marked bilateral foraminal the narrowing at this level which is unchanged. Electronically Signed   By: Drusilla Kanner M.D.   On: 06/27/2020 11:59   DG Chest Port 1 View  Result Date: 08-10-2020 CLINICAL DATA:  80 year old female with pneumothorax EXAM: PORTABLE CHEST 1 VIEW COMPARISON:  Multiple 06/30/2020 FINDINGS: Cardiomediastinal silhouette unchanged in size and contour. Fibrotic changes of the bilateral lungs with reticulonodular opacities bilaterally. More focal nodular opacities in the lower lungs, particularly on the right. Endotracheal tube terminates 6.3 cm above the carina. Gastric tube traverses the mediastinum and terminates with the tip below the diaphragm and the side port above the GE junction. Unchanged right pleural pigtail catheter and large bore right thoracostomy tube. No visualized pneumothorax. Emphysematous changes bilaterally. Left chest wall deformity including multiple rib fracture/postsurgical changes, similar to the comparison. No pleural effusion. IMPRESSION: No visualized pneumothorax, with unchanged position of right pleural pigtail catheter and large bore right thoracostomy tube. Similar appearance of advanced emphysema, fibrotic changes, and superimposed acute bilateral lower airspace disease. Unchanged endotracheal tube and gastric tube. Electronically Signed   By: Gilmer Mor D.O.   On: Aug 10, 2020 05:30   DG Chest Portable 1 View  Result Date: 07/05/2020 CLINICAL DATA:  Pneumothorax. EXAM: PORTABLE CHEST 1 VIEW COMPARISON:  06/27/2020 FINDINGS: Lungs are hyperexpanded. Multiple bilateral rib fractures of varying age again noted. Right chest tube remains in place with persistent but decreased lateral right pneumothorax. Patchy airspace disease in the right mid lung is progressive in the interval. Diffuse  interstitial lung disease with basilar predominance noted. Endotracheal tube tip is approximately 8.3 cm above the base of the carina. NG tube tip is at the GE junction with proximal side port of the NG tube in the distal esophagus. IMPRESSION: 1. Interval decrease in right pneumothorax with right chest tube in place. 2. Worsening patchy airspace disease in the right mid lung. 3. NG tube tip is at the GE junction with proximal side port in the distal esophagus. NG tube could be advanced 9-10 cm to place the proximal side port below the GE junction. Electronically Signed   By: Kennith Center M.D.   On: 07/06/2020 15:46   DG Chest Port 1 View  Result Date: 07/10/2020 CLINICAL DATA:  Status post intubation and chest tube placement. EXAM: PORTABLE CHEST 1 VIEW COMPARISON:  Single-view of the chest earlier today. FINDINGS: Endotracheal tube is in good position with the tip at the level of clavicular heads. A new NG tube courses into the stomach and below the inferior margin of the film. Pigtail catheter is again seen in the right chest but the loop is now more peripheral. The patient's right pneumothorax appears unchanged. No midline shift is identified. The lungs are severely emphysematous. Small right effusion and right worse than left basilar airspace disease are unchanged. IMPRESSION: The patient's right chest tube has been repositioned with the loop now more peripheral in the chest. Large right pneumothorax is unchanged. ETT in good position. New NG tube courses into the stomach and below the inferior margin the film. Severe emphysema and right worse than left basilar airspace disease. Electronically Signed   By: Drusilla Kanner M.D.   On: 06/22/2020 13:52   DG Chest Portable 1 View  Result Date: 06/27/2020 CLINICAL DATA:  Lungs follow-up chest tube insertion. EXAM: PORTABLE CHEST 1 VIEW COMPARISON:  07/01/2020 FINDINGS: ET tube tip is above the carina. Interval placement of right-sided chest tube. The  moderate lateral and large basilar right  pneumothorax are decreased in size when compared with the previous exam. Extensive bilateral interstitial and airspace densities are again noted. Multiple bilateral rib deformities appear chronic. IMPRESSION: 1. Decrease in size right lateral and large basilar pneumothorax status post chest tube placement. 2. Extensive bilateral interstitial and airspace densities. Electronically Signed   By: Signa Kellaylor  Stroud M.D.   On: 07/03/2020 10:43    Assessment/Plan: Left subdural hematoma: The patient does not appear to be an appropriate candidate for craniotomy in her baseline status, multiple medical problems, with thorax, oxygen dependence, etc.  LOS: 1 day     Cristi LoronJeffrey D Anayla Giannetti 2021-04-21, 12:26 PM

## 2020-07-20 NOTE — Progress Notes (Signed)
Advised by dietician that OGT is in GE junction. OGT advanced 10 cm. KUB ordered for placement confirmation.

## 2020-07-20 NOTE — Progress Notes (Signed)
CSW spoke with [facility] [phone]), Gavin Pound, and completed a report due to patient's falls. [employer] confirmed APS will be involved to investigate.   Landon Bassford LCSW

## 2020-07-20 NOTE — Progress Notes (Signed)
RT transported patient from [id number] to CT and back with RN. No complications. RT will continue to monitor.

## 2020-07-20 NOTE — Discharge Summary (Signed)
Patient ID: Alice Wallace [id number] [birth year] 80 y.o.  Admit date: 06/30/2020 Discharge date: 07/20/2020  Admitting Diagnosis: Fall  Discharge Diagnosis Patient Active Problem List   Diagnosis Date Noted  . Closed traumatic fracture of ribs of right side with pneumothorax   . Multiple falls   . SDH (subdural hematoma) (HCC) 07/01/2020  . Protein-calorie malnutrition, severe 04/09/2020  . Pressure injury of skin 04/09/2020  . Acute maculopapular rash 03/22/2020  . Gastroesophageal reflux disease with esophagitis 03/21/2020  . Pressure injury of left buttock, stage 2 (HCC) 03/21/2020  . Chronic respiratory failure with hypoxia (HCC) 03/21/2020  . Metabolic acidosis 03/21/2020  . Hyperkalemia 03/21/2020  . Chronic anemia 03/21/2020  . 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  . CAP (community acquired pneumonia) 03/21/2020  . Sepsis (HCC) 03/21/2020  . Malnutrition of moderate degree 02/03/2020  . Chronic diastolic CHF (congestive heart failure) (HCC) 02/03/2020  . Unstageable pressure ulcer of heel (HCC) 02/01/2020  . Nondisplaced fracture of base of neck of right femur, initial encounter for closed fracture (HCC)   . End of life care   . Palliative care by specialist   . Hip fracture, unspecified laterality, closed, initial encounter (HCC) 01/26/2020  . Chronic obstructive pulmonary disease with acute exacerbation (HCC) 01/21/2020  . Non-STEMI (non-ST elevated myocardial infarction) (HCC) 01/21/2020  . Acute respiratory failure with hypoxia and hypercapnia (HCC)   . Pneumonia due to COVID-19 virus 06/07/2019  . Hypokalemia 06/07/2019  . AKI (acute kidney injury) (HCC) 06/07/2019  . Deafness 06/07/2019  . COVID-19 virus infection 05/27/2019  . Essential hypertension 11/29/2018    Consultants NSGY  Reason for Admission: Respiratory failure  Procedures R chest tube  Hospital Course:  80 yo female from nursing home had unwitnessed fall. At [facility] she was found  to have respiratory failure and right large pneumothorax with multiple rib fractures. Patient noted to be DNR/DNI after transfer to our facility. After discussion with family, elected for compassionate extubation.   Physical Exam: Deceased  Allergies as of 07/19/2020      Reactions   Hydrocodone    Other reaction(s): Unknown   Naproxen    Other reaction(s): Unknown   Other Other (See Comments)   Nuts-    Sulfamethoxazole-trimethoprim    Other reaction(s): Unknown   Tramadol    Other reaction(s): Unknown Pt has tolerated this med 1/5-1/7 with no issues      Medication List    ASK your doctor about these medications   acetaminophen 325 MG tablet Commonly known as: TYLENOL Take 2 tablets (650 mg total) by mouth every 6 (six) hours.   acidophilus Caps capsule Take 1 capsule by mouth 3 (three) times daily.   albuterol 108 (90 Base) MCG/ACT inhaler Commonly known as: VENTOLIN HFA Inhale 2 puffs into the lungs every 6 (six) hours as needed for wheezing or shortness of breath.   alendronate 70 MG tablet Commonly known as: FOSAMAX Take 70 mg by mouth every Saturday. Take with a full glass of water on an empty stomach.   aluminum-magnesium hydroxide-simethicone 200-200-20 MG/5ML Susp Commonly known as: MAALOX Take 30 mLs by mouth 4 (four) times daily -  before meals and at bedtime.   ascorbic acid 500 MG tablet Commonly known as: VITAMIN C Take 500 mg by mouth daily.   aspirin EC 81 MG tablet Take 81 mg by mouth daily. Swallow whole.   atorvastatin 80 MG tablet Commonly known as: LIPITOR Take 1 tablet (80 mg total) by mouth daily.  clopidogrel 75 MG tablet Commonly known as: PLAVIX Take 1 tablet (75 mg total) by mouth daily.   Combivent Respimat 20-100 MCG/ACT Aers respimat Generic drug: Ipratropium-Albuterol Inhale 1 puff into the lungs every 6 (six) hours as needed for wheezing.   diltiazem 120 MG 24 hr capsule Commonly known as: CARDIZEM CD Take 1 capsule (120  mg total) by mouth daily.   diphenhydrAMINE 25 MG tablet Commonly known as: BENADRYL Take 25 mg by mouth every 6 (six) hours as needed (allergic reaction).   docusate sodium 100 MG capsule Commonly known as: COLACE Take 1 capsule (100 mg total) by mouth 2 (two) times daily.   escitalopram 10 MG tablet Commonly known as: LEXAPRO Take 10 mg by mouth at bedtime.   NUTRITIONAL SHAKE PO Take 1 each by mouth 3 (three) times daily with meals. House Shake   feeding supplement Liqd Take 237 mLs by mouth 3 (three) times daily between meals.   ferrous sulfate 325 (65 FE) MG tablet Take 325 mg by mouth 2 (two) times daily with a meal. Ask about: Which instructions should I use?   Fluticasone-Salmeterol 100-50 MCG/DOSE Aepb Commonly known as: ADVAIR Inhale 1 puff into the lungs 2 (two) times daily. Ask about: Which instructions should I use?   furosemide 20 MG tablet Commonly known as: LASIX Take 20 mg by mouth daily.   guaiFENesin 100 MG/5ML Soln Commonly known as: ROBITUSSIN Take 15 mLs by mouth every 6 (six) hours as needed for cough.   levofloxacin 750 MG tablet Commonly known as: LEVAQUIN Take 1 tablet (750 mg total) by mouth daily.   loperamide 2 MG tablet Commonly known as: IMODIUM A-D Take 2 mg by mouth daily as needed for diarrhea or loose stools.   loratadine 10 MG tablet Commonly known as: CLARITIN Take 10 mg by mouth daily.   magnesium hydroxide 400 MG/5ML suspension Commonly known as: MILK OF MAGNESIA Take 30 mLs by mouth daily as needed for mild constipation.   metoCLOPramide 5 MG tablet Commonly known as: REGLAN Take 5 mg by mouth every 8 (eight) hours as needed for nausea. If ondansetron does not work, give reglan.   metoprolol tartrate 25 MG tablet Commonly known as: LOPRESSOR Take 25 mg by mouth 2 (two) times daily.   multivitamin with minerals Tabs tablet Take 1 tablet by mouth daily.   nitroGLYCERIN 0.4 MG SL tablet Commonly known as:  NITROSTAT Place 1 tablet (0.4 mg total) under the tongue every 5 (five) minutes as needed for chest pain.   nystatin cream Commonly known as: MYCOSTATIN Apply 1 application topically 2 (two) times daily.   omeprazole 40 MG capsule Commonly known as: PRILOSEC Take 40 mg by mouth daily.   ondansetron 4 MG tablet Commonly known as: ZOFRAN Take 1 tablet (4 mg total) by mouth every 6 (six) hours as needed for nausea.   OXYGEN Inhale 4 L/min into the lungs continuous.   potassium chloride SA 20 MEQ tablet Commonly known as: KLOR-CON Take 40 mEq by mouth daily.   tiotropium 18 MCG inhalation capsule Commonly known as: SPIRIVA Place 1 capsule (18 mcg total) into inhaler and inhale daily.   traMADol 50 MG tablet Commonly known as: ULTRAM Take 1 tablet (50 mg total) by mouth every 8 (eight) hours.   Vitamin D3 125 MCG (5000 UT) Caps Take 5,000 Units by mouth daily. Ask about: Which instructions should I use?           Signed: Diamantina Monks, MD [facility] 07/20/2020, 5:25 PM

## 2020-07-20 DEATH — deceased

## 2020-07-21 LAB — BLOOD GAS, VENOUS
Acid-base deficit: 2.3 mmol/L — ABNORMAL HIGH (ref 0.0–2.0)
Bicarbonate: 25.1 mmol/L (ref 20.0–28.0)
O2 Saturation: 44.1 %
Patient temperature: 37
pCO2, Ven: 56 mmHg (ref 44.0–60.0)
pH, Ven: 7.26 (ref 7.250–7.430)

## 2022-01-24 IMAGING — CT CT L SPINE W/O CM
3 series · 13 of 34 positions shown, 16 images · non-contrast
Comparison: CT abdomen and pelvis 04/08/2020.

CLINICAL DATA: Patient status post fall. Low back pain. Initial
encounter.

EXAM:
CT LUMBAR SPINE WITHOUT CONTRAST
TECHNIQUE: Multidetector CT imaging of the lumbar spine was performed without
intravenous contrast administration. Multiplanar CT image
reconstructions were also generated.

[Series 1: l-spine · axial · 0.42mm/px · z∈[-432,-306]mm · 5 of 93 slices shown, 7 images]
[im 15/93  soft-tissue]
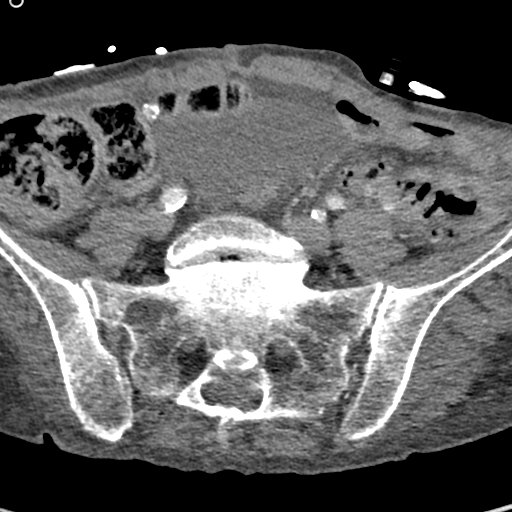
[im 15/93  bone]
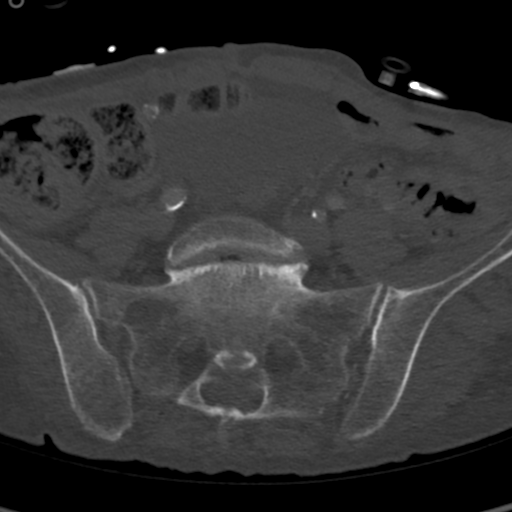
[im 29/93  bone]
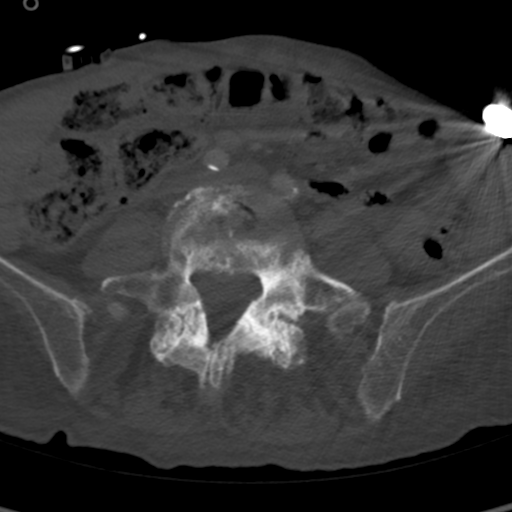
[im 50/93  bone]
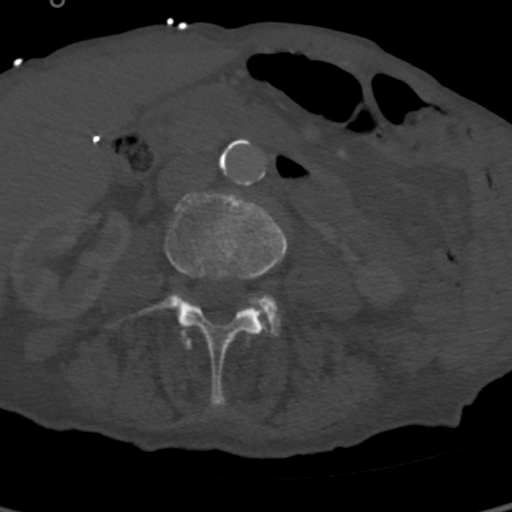
[im 64/93  bone]
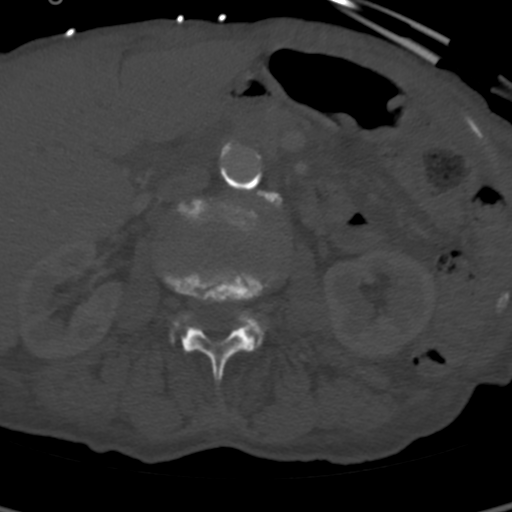
[im 78/93  soft-tissue]
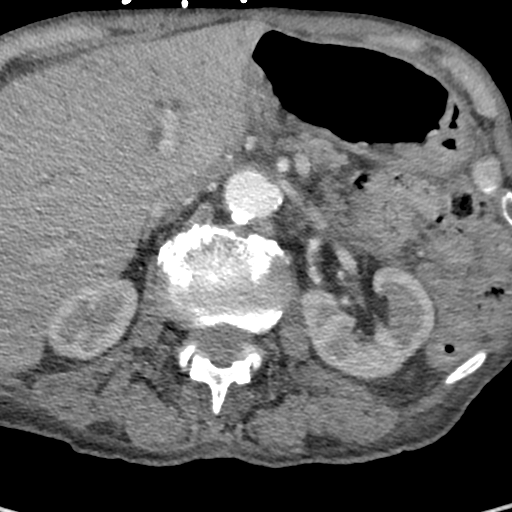
[im 78/93  bone]
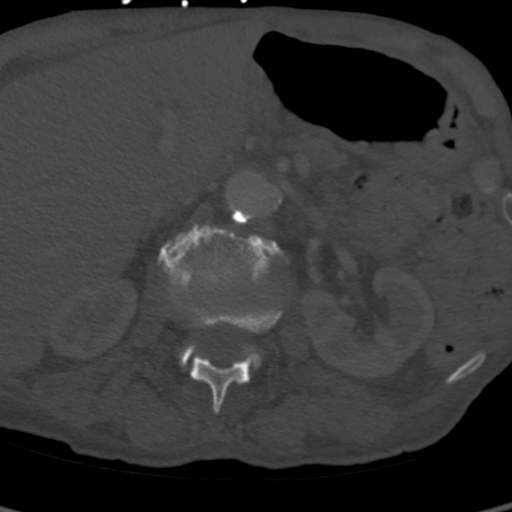

[Series 4: l-spine bone cor · coronal · 0.43mm/px · 3 of 76 slices shown]
[im 16/76  bone]
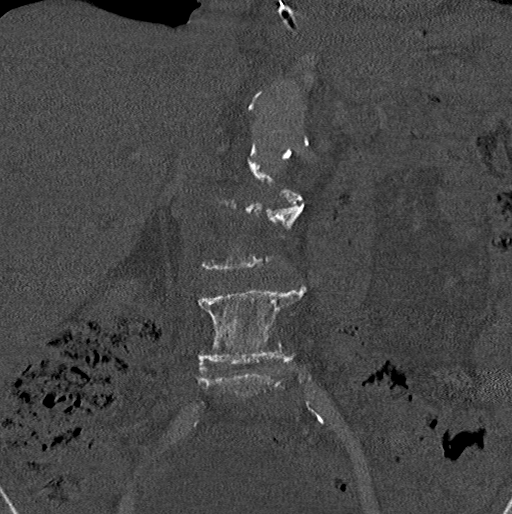
[im 31/76  bone]
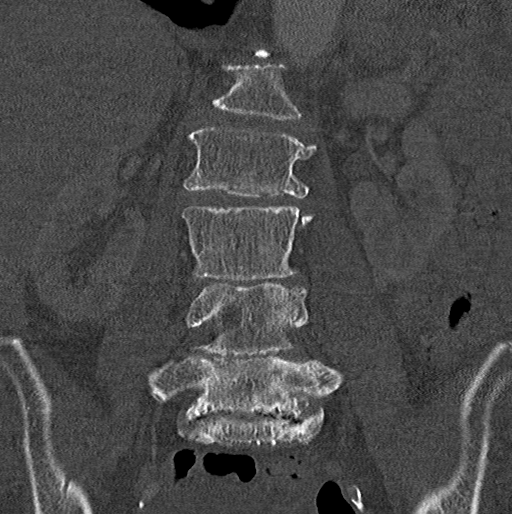
[im 46/76  bone]
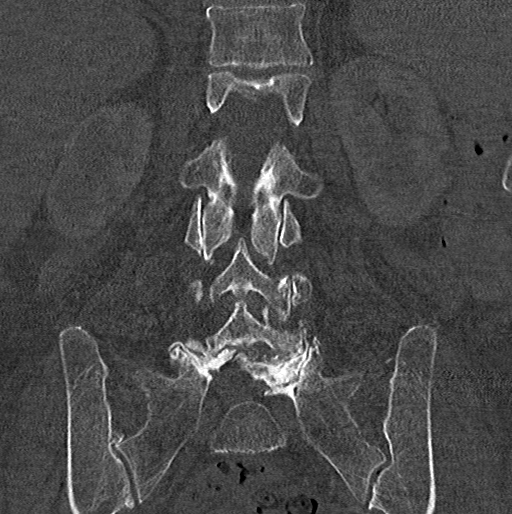

[Series 5: l-spine bone sag · sagittal · 0.29mm/px · 5 of 110 slices shown, 6 images]
[im 37/110  bone]
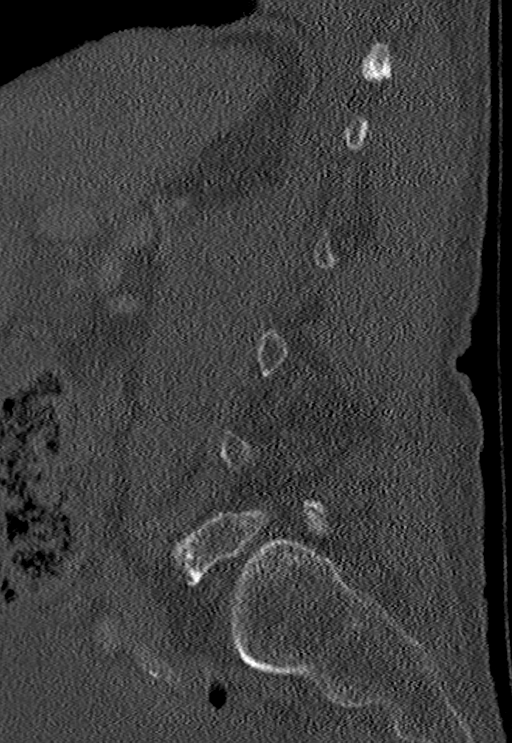
[im 46/110  bone]
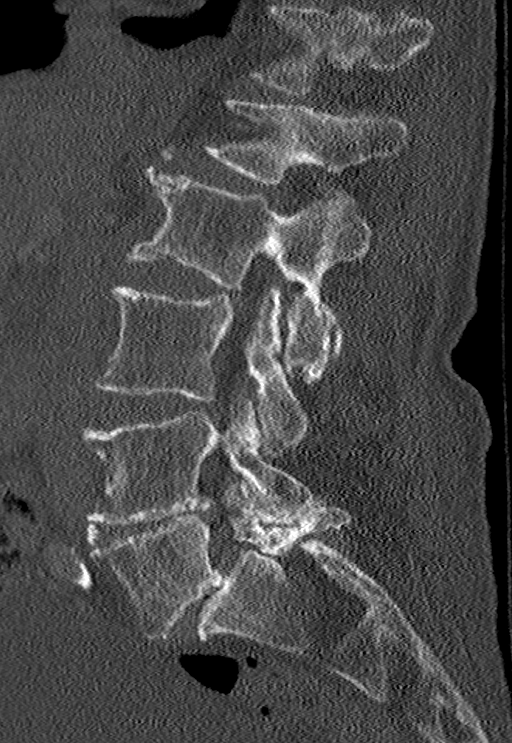
[im 55/110  soft-tissue]
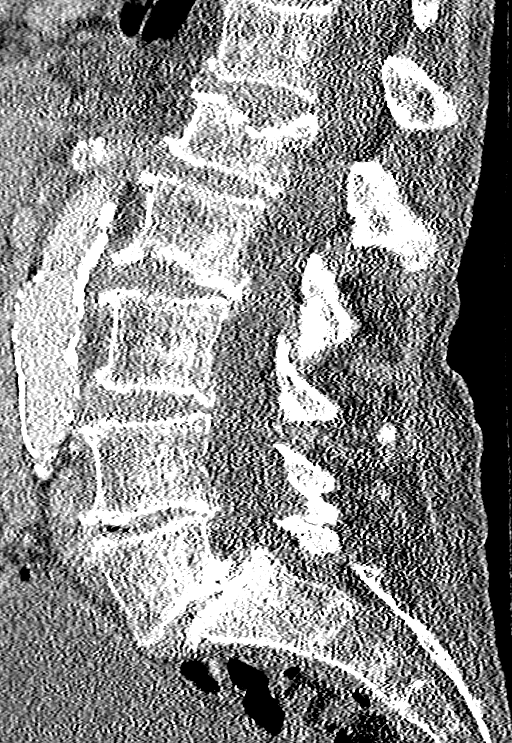
[im 55/110  bone]
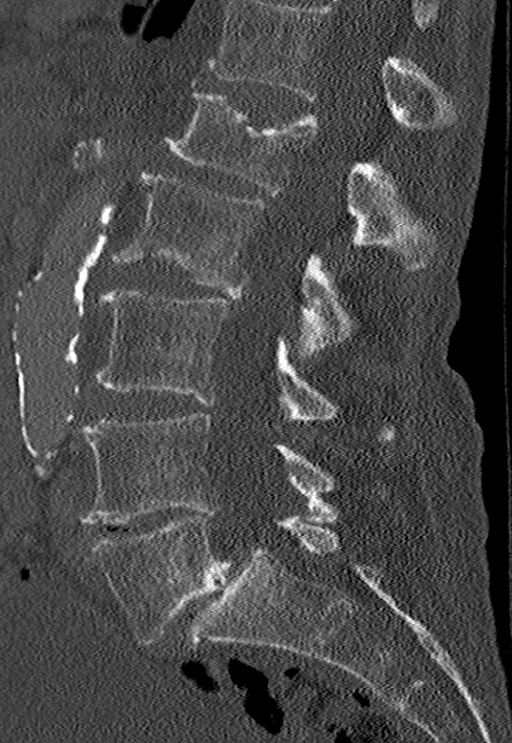
[im 64/110  bone]
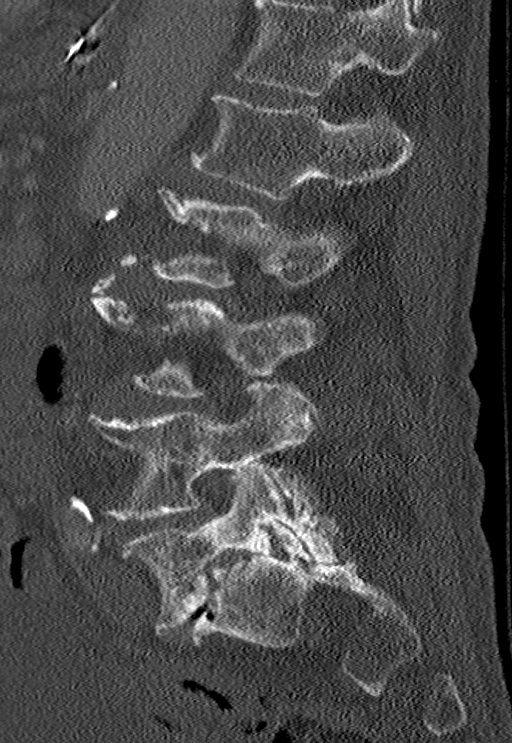
[im 73/110  bone]
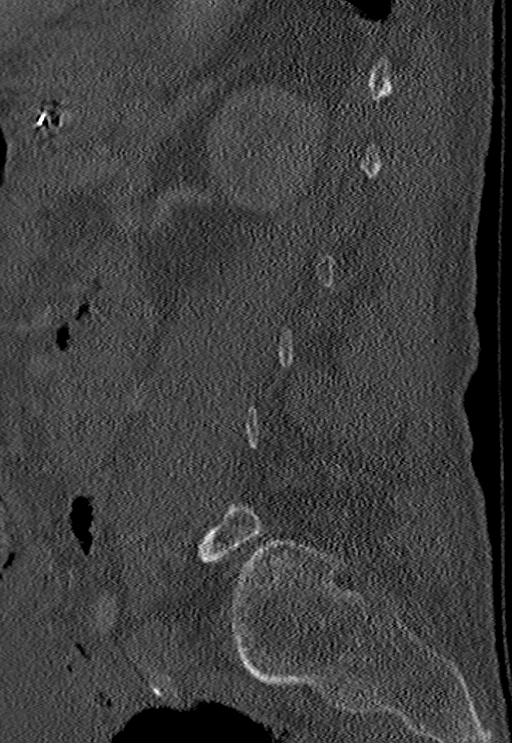

[13 of 34 positions shown; findings below may reference images not displayed]

FINDINGS: Segmentation: Standard.

Alignment: 1.3 cm anterolisthesis due to severe facet degenerative
disease is unchanged.

Vertebrae: Remote superior endplate compression fracture of L1 is
unchanged. The patient has a mild inferior endplate compression
fracture of L2 with vertebral body height loss anteriorly of
approximately 15%. The fracture is new since the prior CT and
fracture lines are visible. No involvement of the posterior
elements. No other fracture. No worrisome lesion.

Paraspinal and other soft tissues: See report of dedicated chest,
abdomen and pelvis CT today.

Disc levels: T12-L1: Mild retropulsion off the superior endplate of
L1 without stenosis.

L1-2: Negative.

L2-3: Facet degenerative disease and a shallow disc bulge. The
central canal appears open.

L3-4: Mild-to-moderate facet degenerative disease. Minimal disc
bulge. No stenosis.

L4-5: Advanced bilateral facet arthropathy. Loss of disc space
height and vacuum disc phenomenon. Mild endplate spurring. The
central canal and foramina appear open.

L5-S1: The disc is uncovered without bulging. The central canal is
open. Marked bilateral foraminal narrowing is unchanged.
IMPRESSION: Acute appearing mild inferior endplate compression fracture of L2
with vertebral body height loss of up to 15%.

Remote superior endplate compression fracture of L1.

Advanced facet arthropathy at L5-S1 results in 1.3 cm
anterolisthesis. There is marked bilateral foraminal the narrowing
at this level which is unchanged.
